# Patient Record
Sex: Female | Born: 1965 | Race: White | Hispanic: No | Marital: Married | Smoking: Never smoker
Health system: Southern US, Community
[De-identification: ages and names within clinical notes are randomized; demographics above are authoritative.]

## PROBLEM LIST (undated history)

## (undated) DIAGNOSIS — J40 Bronchitis, not specified as acute or chronic: Secondary | ICD-10-CM

## (undated) DIAGNOSIS — F329 Major depressive disorder, single episode, unspecified: Secondary | ICD-10-CM

## (undated) DIAGNOSIS — J189 Pneumonia, unspecified organism: Secondary | ICD-10-CM

## (undated) DIAGNOSIS — F419 Anxiety disorder, unspecified: Secondary | ICD-10-CM

## (undated) DIAGNOSIS — K219 Gastro-esophageal reflux disease without esophagitis: Secondary | ICD-10-CM

## (undated) DIAGNOSIS — F32A Depression, unspecified: Secondary | ICD-10-CM

## (undated) DIAGNOSIS — Z9889 Other specified postprocedural states: Secondary | ICD-10-CM

## (undated) DIAGNOSIS — G43909 Migraine, unspecified, not intractable, without status migrainosus: Secondary | ICD-10-CM

## (undated) DIAGNOSIS — G4700 Insomnia, unspecified: Secondary | ICD-10-CM

## (undated) DIAGNOSIS — T8859XA Other complications of anesthesia, initial encounter: Secondary | ICD-10-CM

## (undated) DIAGNOSIS — R112 Nausea with vomiting, unspecified: Secondary | ICD-10-CM

## (undated) DIAGNOSIS — M797 Fibromyalgia: Secondary | ICD-10-CM

## (undated) DIAGNOSIS — T4145XA Adverse effect of unspecified anesthetic, initial encounter: Secondary | ICD-10-CM

## (undated) HISTORY — PX: ABDOMINAL HYSTERECTOMY: SHX81

## (undated) HISTORY — DX: Insomnia, unspecified: G47.00

## (undated) HISTORY — DX: Migraine, unspecified, not intractable, without status migrainosus: G43.909

## (undated) HISTORY — DX: Fibromyalgia: M79.7

---
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
## 1986-12-12 HISTORY — PX: CHOLECYSTECTOMY: SHX55

## 2000-10-09 ENCOUNTER — Encounter: Admission: RE | Admit: 2000-10-09 | Discharge: 2000-10-19 | Payer: Self-pay | Admitting: Occupational Medicine

## 2001-03-28 ENCOUNTER — Other Ambulatory Visit: Admission: RE | Admit: 2001-03-28 | Discharge: 2001-03-28 | Payer: Self-pay | Admitting: Neurology

## 2001-04-15 ENCOUNTER — Emergency Department (HOSPITAL_COMMUNITY): Admission: EM | Admit: 2001-04-15 | Discharge: 2001-04-15 | Payer: Self-pay | Admitting: Emergency Medicine

## 2001-04-15 ENCOUNTER — Encounter: Payer: Self-pay | Admitting: Emergency Medicine

## 2001-07-30 ENCOUNTER — Emergency Department (HOSPITAL_COMMUNITY): Admission: EM | Admit: 2001-07-30 | Discharge: 2001-07-30 | Payer: Self-pay | Admitting: Emergency Medicine

## 2001-07-30 ENCOUNTER — Encounter: Payer: Self-pay | Admitting: Emergency Medicine

## 2001-09-14 ENCOUNTER — Ambulatory Visit (HOSPITAL_COMMUNITY): Admission: RE | Admit: 2001-09-14 | Discharge: 2001-09-14 | Payer: Self-pay | Admitting: Family Medicine

## 2001-09-14 ENCOUNTER — Encounter: Payer: Self-pay | Admitting: Family Medicine

## 2001-10-09 ENCOUNTER — Emergency Department (HOSPITAL_COMMUNITY): Admission: EM | Admit: 2001-10-09 | Discharge: 2001-10-09 | Payer: Self-pay | Admitting: Emergency Medicine

## 2001-11-05 ENCOUNTER — Encounter: Payer: Self-pay | Admitting: Family Medicine

## 2001-11-05 ENCOUNTER — Other Ambulatory Visit: Admission: RE | Admit: 2001-11-05 | Discharge: 2001-11-05 | Payer: Self-pay | Admitting: Obstetrics & Gynecology

## 2001-11-05 ENCOUNTER — Ambulatory Visit (HOSPITAL_COMMUNITY): Admission: RE | Admit: 2001-11-05 | Discharge: 2001-11-05 | Payer: Self-pay | Admitting: Family Medicine

## 2002-07-23 ENCOUNTER — Inpatient Hospital Stay (HOSPITAL_COMMUNITY): Admission: RE | Admit: 2002-07-23 | Discharge: 2002-07-24 | Payer: Self-pay | Admitting: Obstetrics & Gynecology

## 2003-03-27 ENCOUNTER — Ambulatory Visit (HOSPITAL_COMMUNITY): Admission: RE | Admit: 2003-03-27 | Discharge: 2003-03-27 | Payer: Self-pay | Admitting: Internal Medicine

## 2003-03-27 ENCOUNTER — Encounter: Payer: Self-pay | Admitting: Internal Medicine

## 2003-03-31 ENCOUNTER — Encounter: Payer: Self-pay | Admitting: Emergency Medicine

## 2003-03-31 ENCOUNTER — Emergency Department (HOSPITAL_COMMUNITY): Admission: EM | Admit: 2003-03-31 | Discharge: 2003-03-31 | Payer: Self-pay | Admitting: Emergency Medicine

## 2003-12-13 DIAGNOSIS — M797 Fibromyalgia: Secondary | ICD-10-CM

## 2003-12-13 HISTORY — DX: Fibromyalgia: M79.7

## 2004-12-20 ENCOUNTER — Emergency Department (HOSPITAL_COMMUNITY): Admission: EM | Admit: 2004-12-20 | Discharge: 2004-12-20 | Payer: Self-pay | Admitting: *Deleted

## 2004-12-29 ENCOUNTER — Ambulatory Visit (HOSPITAL_COMMUNITY): Admission: RE | Admit: 2004-12-29 | Discharge: 2004-12-29 | Payer: Self-pay | Admitting: Internal Medicine

## 2004-12-29 ENCOUNTER — Ambulatory Visit: Payer: Self-pay | Admitting: Internal Medicine

## 2005-01-20 ENCOUNTER — Ambulatory Visit: Payer: Self-pay | Admitting: Internal Medicine

## 2005-02-01 ENCOUNTER — Ambulatory Visit (HOSPITAL_COMMUNITY): Admission: RE | Admit: 2005-02-01 | Discharge: 2005-02-01 | Payer: Self-pay | Admitting: Obstetrics & Gynecology

## 2006-04-06 ENCOUNTER — Emergency Department (HOSPITAL_COMMUNITY): Admission: EM | Admit: 2006-04-06 | Discharge: 2006-04-06 | Payer: Self-pay | Admitting: Emergency Medicine

## 2006-12-12 HISTORY — PX: SHOULDER SURGERY: SHX246

## 2008-10-28 ENCOUNTER — Ambulatory Visit (HOSPITAL_COMMUNITY): Admission: RE | Admit: 2008-10-28 | Discharge: 2008-10-28 | Payer: Self-pay | Admitting: Family Medicine

## 2008-11-13 ENCOUNTER — Ambulatory Visit: Payer: Self-pay | Admitting: Specialist

## 2008-11-19 ENCOUNTER — Ambulatory Visit: Payer: Self-pay | Admitting: Specialist

## 2009-08-03 ENCOUNTER — Ambulatory Visit (HOSPITAL_COMMUNITY): Admission: RE | Admit: 2009-08-03 | Discharge: 2009-08-03 | Payer: Self-pay | Admitting: Family Medicine

## 2010-07-23 ENCOUNTER — Ambulatory Visit (HOSPITAL_COMMUNITY): Admission: RE | Admit: 2010-07-23 | Discharge: 2010-07-23 | Payer: Self-pay | Admitting: Family Medicine

## 2011-01-02 ENCOUNTER — Encounter: Payer: Self-pay | Admitting: Family Medicine

## 2011-04-29 NOTE — Discharge Summary (Signed)
NAME:  Nichole Brown, Nichole Brown NO.:  [id number]   MEDICAL RECORD NO.:  [id number]          PATIENT TYPE:  EMS   LOCATION:  MAJO                         FACILITY:  [facility]   PHYSICIAN:  Duke Salvia, M.D.  DATE OF BIRTH:  Jan 22, 1966   DATE OF ADMISSION:  12/20/2004  DATE OF DISCHARGE:                                 DISCHARGE SUMMARY   DISCHARGE DIAGNOSES:  1.  Recurrent syncope associated with tachy palpitations, chest pressure,      extreme weakness and nausea.  2.  Finding of maxillary sinusitis by head CT taken in the emergency room.  3.  Headache, now resolved.  4.  Prescribed Augmentin for a ten day course.  5.  Sinusitis.  6.  Proposed tilt study on Wednesday, January 18 as the first case.   SECONDARY DIAGNOSES:  1.  Chronic back pain/fibromyalgia.  2.  Status post hysterectomy.   PROCEDURES:  1.  CT of the head in the emergency room showing evidence of active      maxillary sinusitis.  2.  Electrocardiogram taken at 9:30 in the morning on January 9.  This shows      sinus rhythm, no ST segment abnormalities.   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  She came to work at  about 6:15 and had a bagel at about 7:30.  At about 8 o'clock she started  feeling the slow but steady onset of nausea.  She went to the employee's  lounge where she felt weak and threw up the bagel.  She sat down in the  lounge for a while, resting and gathering her strength. She stood up to  leave, walked about 20 feet out into the catheterization laboratory and then  felt as if she were going to pass out; basically a pre-syncopal episode.  A  fellow employee eased her back into the lounge.  She was transferred from  there to the emergency room.  Electrocardiogram showed sinus rhythm.  A CT  of the head showed maxillary sinusitis.  She had a headache which resolved  after ibuprofen.  She was seen  in consultation by Dr. Sherryl Manges. At the  time of his investigation, the patient was still having palpitations.  Blood  pressure was low at 71, but had risen to 113.  Pulse was slightly irregular  at that time.  Dr. Graciela Husbands noted no nystagmus on this examination.   HOSPITAL COURSE:  After transfer to the emergency room, the patient was  hydrated with 1,000 mL of normal saline.  Serial electrolytes were obtained  with a sodium of 141, a potassium of 3.7, a chloride of 115, C02 23, glucose  93, BUN 9 and creatinine 0.6.  SGOT was 21, SGPT was 26 and alkaline  phosphatase 72.  Complete blood count:  White count 9.8, hemoglobin 12.3,  hematocrit 35.0 and platelet count 265,000.  She was 99% oxygen saturated on  room air.  Vital signs:  Temperature 97.0, blood pressure 96/67, heart rate  71 and respirations  16.  The patient's medications prior to this event are  Flexeril, Paxil and hormone replacement therapy.   DISCHARGE MEDICATIONS:  In addition to her regular medications, the patient  is discharged on:  1.  Augmentin 875 mg twice daily for a ten day course.  2.  Antivert 25 mg to take every six hours as needed.  3.  The patient received Zofran here 4 mg IV for nausea.  4.  She received ibuprofen 800 mg for a headache.   FOLLOW UP:  The patient has been scheduled for a tilt table study on December 29, 2004 with the first case for Dr. Graciela Husbands.  The patient will be called with  this appointment.       GM/MEDQ  D:  12/20/2004  T:  12/20/2004  Job:  [id number]   cc:   Duke Salvia, M.D.

## 2011-04-29 NOTE — H&P (Signed)
NAME:  Nichole Brown, Nichole Brown                        ACCOUNT NO.:  [id number]   MEDICAL RECORD NO.:  [id number]                   PATIENT TYPE:  AMB   LOCATION:  DAY                                  FACILITY:  [facility]   PHYSICIAN:  Lazaro Arms, M.D.                DATE OF BIRTH:  February 26, 1966   DATE OF ADMISSION:  07/23/2002  DATE OF DISCHARGE:                                HISTORY & PHYSICAL   HISTORY OF PRESENT ILLNESS:  The patient is a 45 year old white female  gravida 2, para 2 who is admitted for a vaginal hysterectomy  bilateral  salpingo-oophorectomy.  The patient's periods are very heavy, she bleeds 5-6  days.  The first day she gets sweaty, shaky, lightheaded, and often passes  out.  Her cramps are also quite unbearable and unresponsive to nonsteroidal  anti-inflammatory medications and even low-grade narcotics.  Additionally,  she suffers with menstrual migraines the first day of her period and  sometimes they can go on for a couple of days.  Additionally, she has a  great deal of emotionality before her menses, diarrhea, and abdominal  cramping when she has her period.  She has been tried on birth control pills  in the past but they do not work and actually make her feel worse.  The  patient states that when she has clots it feels like she is pushing out a  baby.  As a result, she is admitted for the above mentioned surgery.   PAST SURGICAL HISTORY:  She had a C-section in 1988 and 1990, her  gallbladder in 1988, and she had open heart ablation in [place] in  1967.   PAST MEDICAL HISTORY:  Otherwise, negative except for herpes.   PAST OB HISTORY:  Again, is two C-sections.   CURRENT MEDICATIONS:  Currently none, just as needed for her cramps and her  migraines.  She has been seen at [facility] these.   REVIEW OF SYSTEMS:  Otherwise, negative.   PHYSICAL EXAMINATION:  VITAL SIGNS:  Blood pressure 120/80, weight 158  pounds, hematocrit 14  mg/dL.  HEENT:  Unremarkable.  NECK:  Thyroid is normal.  LUNGS:  Clear.  HEART:  Regular rate and rhythm with no murmur, regurgitation, or gallop.  BREAST:  Without mass, tissue, or skin changes.  ABDOMEN:  Benign, no hepatosplenomegaly or masses.  GENITALIA:  She has normal external genitalia, __________ discharge.  Her  cervix is nulliparous without lesion.  There is some descent.  Uterus is  normal size, shape, and contour, tender to palpation.  The adnexa are also  tender.  EXTREMITIES:  Warm with no edema.  NEUROLOGICAL:  Grossly intact.   IMPRESSION:  1. Menometrorrhagia.  2. Dysmenorrhea.  3. Dyspareunia.  4. Menstrual migraines.  5. Bad premenstrual dysphoric disorder.   PLAN:  The patient's admitted for a total abdominal hysterectomy bilateral  salpingo-oophorectomy.  She  understands that with having had two C-sections,  the bladder may be too adherent and we may have to go to abdominal approach  but with the descent she has, it should be a reasonable option to try.  She  understands this will render her menopausal and will require estrogen  replacement therapy postoperatively.                                                   Lazaro Arms, M.D.    Loraine Maple  D:  07/22/2002  T:  07/22/2002  Job:  [id number]

## 2011-04-29 NOTE — Discharge Summary (Signed)
   NAME:  Nichole Brown, Nichole Brown                        ACCOUNT NO.:  [id number]   MEDICAL RECORD NO.:  [id number]                   PATIENT TYPE:  INP   LOCATION:  A417                                 FACILITY:  [facility]   PHYSICIAN:  Lazaro Arms, M.D.                DATE OF BIRTH:  December 07, 1966   DATE OF ADMISSION:  07/23/2002  DATE OF DISCHARGE:  07/24/2002                                 DISCHARGE SUMMARY   DISCHARGE DIAGNOSES:  1. Status post total vaginal hysterectomy and bilateral salpingo-     oophorectomy.  2. Unremarkable postoperative course.   PROCEDURE:  TVH- BSO.   Please refer to the transcribed history and physical, and the operative note  for details of admission to hospital .   HOSPITAL COURSE:  The patient was admitted after surgery.  Intraoperative  procedure went well.  Her postoperative course was completely unremarkable.  She tolerated clear liquids and a regular diet.  Voided without symptoms.  Was ambulatory without symptoms.  Tolerated transition from IV to oral pain  medicine.  She was in minimal pain, having no bleeding per vagina.  Her  hemoglobin and hematocrit on postoperative day #1 was 11.0 and 31.5.  Preoperatively she was 13 and 39.  She was discharged to home on the evening  of postoperative day #1 in good, stable condition.  She will follow up in  the office on next Wednesday.  She was given Toradol 10 mg q.8h. and Tylox  as needed for pain, and she was given instructions and precautions for  return prior to that time.                                               Lazaro Arms, M.D.    Loraine Maple  D:  07/24/2002  T:  07/29/2002  Job:  [id number]

## 2011-04-29 NOTE — Op Note (Signed)
NAME:  Nichole Brown, Nichole Brown                        ACCOUNT NO.:  [id number]   MEDICAL RECORD NO.:  [id number]                   PATIENT TYPE:  AMB   LOCATION:  DAY                                  FACILITY:  [facility]   PHYSICIAN:  Lazaro Arms, M.D.                DATE OF BIRTH:  Feb 09, 1966   DATE OF PROCEDURE:  07/23/2002  DATE OF DISCHARGE:                                 OPERATIVE REPORT   PREOPERATIVE DIAGNOSES:  1. Menometrorrhagia.  2. Dysmenorrhea.  3. Dyspareunia.  4. Menstrual migraines.  5. Premenstrual dysphoric disorder.   POSTOPERATIVE DIAGNOSES:  1. Menometrorrhagia.  2. Dysmenorrhea.  3. Dyspareunia.  4. Menstrual migraines.  5. Premenstrual dysphoric disorder.   PROCEDURE:  Total vaginal hysterectomy, bilateral salpingo-oophorectomy.   SURGEON:  Lazaro Arms, M.D.   ANESTHESIA:  General endotracheal.   ESTIMATED BLOOD LOSS:  250 cc.   FINDINGS:  The patient had what appeared to be a normal uterus, tubes, and  ovaries.  Shows no abnormalities of the peritoneal cavity that were seen.   DESCRIPTION OF OPERATION:  The patient was taken to the operating room,  placed in a supine position, where she underwent general endotracheal  anesthesia.  She was then placed in dorsal lithotomy position.  The vagina  was prepped.  She was draped in the usual sterile fashion.  A Foley catheter  was placed.  A weighted speculum was placed in the posterior vagina, and the  cervix was grasped with thyroid tenaculum.  Sensorcaine 0.5% with 1:200,000  epinephrine was injected circumferentially about the cervix at the area of  the incision.  For hemostasis, electrocautery unit was used, and the  circumferential incision was made.  The anterior and posterior vagina was  pushed off the lower uterine segment without difficulty.  The posterior cul-  de-sac was entered sharply.  The left uterosacral ligament was clamped, cut,  transfixed, and suture ligated and held.  The right round  ligament was  clamped, cut, transfixed, and suture ligated and held.  A weighted speculum  was placed in the posterior peritoneum.  Cardinal ligament was clamped, cut,  transfixed, and suture ligated and cut.  A long bill weighted speculum was  then placed in the posterior peritoneum.  Serial pedicles were taken up the  cervix, through the cardinal ligament, each pedicle being clamped, cut,  transfixed, and suture ligated and cut.  The anterior vagina was pushed  further off the lower segment, and the anterior peritoneum was entered  sharply under direct visualization without difficulty.  The anterior and  posterior leaves of the broad ligament were plicated, and the uterine  vessels were clamped, cut, and suture ligated, serial pedicles bilaterally.  Serial pedicles were then taken up the fundus, each pedicle being clamped,  cut, and suture ligated.  The uteroovarian ligaments were cross-clamped  bilaterally, double suture ligated, and held.  The infundibulopelvic  ligament was then  isolated, clamped, cut, and double suture ligated  bilaterally, and the ovaries and tubes were removed bilaterally as well.  There was good hemostasis of these pedicles.  The bottom of the peritoneum  was seen and was not bleeding, had not slipped out of the suture.  The lower  pelvis was then irrigated vigorously.  The peritoneum was closed in a  pursestring fashion.  The anterior and posterior vagina was closed in an  interrupted fashion, anteriorly, posteriorly without difficulty, and the  vagina was closed.  The uterosacral ligament held sutures were then tied  together and cut for vaginal support.  The vagina was hemostatic at this  point.  The patient was awakened from anesthesia and taken to the recovery  room in good, stable condition.  All counts being correct.  She was stable  in the recovery room.  Estimated blood loss was 250 cc.                                                Lazaro Arms,  M.D.    Loraine Maple  D:  07/23/2002  T:  07/29/2002  Job:  [id number]

## 2012-06-26 ENCOUNTER — Other Ambulatory Visit: Payer: Self-pay | Admitting: Family Medicine

## 2012-06-26 ENCOUNTER — Ambulatory Visit (HOSPITAL_COMMUNITY)
Admission: RE | Admit: 2012-06-26 | Discharge: 2012-06-26 | Disposition: A | Discharge status: Home / Self Care | Payer: PRIVATE HEALTH INSURANCE | Source: Ambulatory Visit | Attending: Family Medicine | Admitting: Family Medicine

## 2012-06-26 DIAGNOSIS — R059 Cough, unspecified: Secondary | ICD-10-CM

## 2012-06-26 DIAGNOSIS — R05 Cough: Secondary | ICD-10-CM

## 2012-06-26 DIAGNOSIS — N644 Mastodynia: Secondary | ICD-10-CM | POA: Insufficient documentation

## 2012-06-26 DIAGNOSIS — R0602 Shortness of breath: Secondary | ICD-10-CM | POA: Insufficient documentation

## 2013-03-05 ENCOUNTER — Telehealth: Payer: Self-pay | Admitting: Family Medicine

## 2013-03-05 NOTE — Telephone Encounter (Signed)
Rxs called into [facility]. Patient notified.

## 2013-03-05 NOTE — Telephone Encounter (Signed)
We can call in zofran odt 4 24 one every six hours prn. I'll write a rx for hydrocod/acet 5/325 #24 prn pain.

## 2013-03-05 NOTE — Telephone Encounter (Signed)
Was given Cefprozil 02/22/2013 and patient still has ear pain and migraine headache.  Feeling nausea also.

## 2013-03-22 ENCOUNTER — Telehealth: Payer: Self-pay | Admitting: Family Medicine

## 2013-03-22 NOTE — Telephone Encounter (Signed)
Pt states that she is still having ear pain and would like to know if she can get another round of antibiotic? She finished her last one about 2 weeks ago.  Or does she need to come back in? She states her right ear hurt worse at night when she lays down to sleep. Please use [facility] and call pt [phone]

## 2013-03-22 NOTE — Telephone Encounter (Signed)
Cefzil 500 twice a day 10 days

## 2013-03-22 NOTE — Telephone Encounter (Signed)
Med called into [facility]. Patient notified. 

## 2013-03-23 ENCOUNTER — Encounter: Payer: Self-pay | Admitting: *Deleted

## 2013-04-26 ENCOUNTER — Ambulatory Visit (INDEPENDENT_AMBULATORY_CARE_PROVIDER_SITE_OTHER): Payer: 59 | Admitting: Family Medicine

## 2013-04-26 ENCOUNTER — Encounter: Payer: Self-pay | Admitting: Family Medicine

## 2013-04-26 VITALS — BP 100/60 | Temp 98.3°F | Ht 63.5 in | Wt 160.4 lb

## 2013-04-26 DIAGNOSIS — H6690 Otitis media, unspecified, unspecified ear: Secondary | ICD-10-CM

## 2013-04-26 DIAGNOSIS — H6691 Otitis media, unspecified, right ear: Secondary | ICD-10-CM

## 2013-04-26 MED ORDER — AMOXICILLIN-POT CLAVULANATE 875-125 MG PO TABS: 1.0000 | ORAL_TABLET | Freq: Two times a day (BID) | ORAL | Status: DC

## 2013-04-26 MED ORDER — ETODOLAC 400 MG PO TABS: 400.0000 mg | ORAL_TABLET | Freq: Two times a day (BID) | ORAL | Status: DC

## 2013-04-26 NOTE — Progress Notes (Signed)
  Subjective:    Patient ID: Nichole Brown, female    DOB: 1966-06-22, 47 y.o.   MRN: [id number]  Neck Pain  This is a new problem. The current episode started in the past 7 days. The problem occurs constantly. The problem has been gradually worsening. The pain is associated with nothing. The pain is present in the right side. The quality of the pain is described as shooting. The pain is at a severity of 5/10. The pain is moderate. The symptoms are aggravated by sneezing. The pain is worse during the day. Stiffness is present in the morning. She has tried acetaminophen for the symptoms. The treatment provided mild relief.   Pain worse with certain motions. Tender at times. Also some congestion. Right ear pressure intermittently.   Review of Systems  HENT: Positive for neck pain.    ROS otherwise negative.     Objective:   Physical Exam  Alert no acute distress. Right lateral neck tender to palpation. Right TM some retraction. Pharynx normal neck supple. Lungs clear heart regular in rhythm.      Assessment & Plan:  Impression 1 persistent right TM effusion. #2 lateral neck strain. Plan local measures discussed. Antibiotics prescribed. Anti-inflammatory medicine prescribed. Expect gradual resolution.

## 2013-04-26 NOTE — Patient Instructions (Signed)
Take all the antibiotics 

## 2013-05-01 ENCOUNTER — Telehealth: Payer: Self-pay | Admitting: Family Medicine

## 2013-05-01 ENCOUNTER — Encounter: Payer: Self-pay | Admitting: Family Medicine

## 2013-05-01 MED ORDER — LEVOFLOXACIN 500 MG PO TABS: 500.0000 mg | ORAL_TABLET | Freq: Every day | ORAL | Status: AC

## 2013-05-01 NOTE — Telephone Encounter (Signed)
Sent in levaquin to pharmacy. Patient was notified. Please give patient a work excuse for today. Thank you.

## 2013-05-01 NOTE — Telephone Encounter (Signed)
Pt was seen on 04/26/13 and was diagnosed with right TM effusion and lateral neck strain. She was prescribed Augmentin 875 mg BID x 10 days and Lodine 400 mg BID.

## 2013-05-01 NOTE — Telephone Encounter (Signed)
Pt states since she started taking her antibiotic (amoxicillin) and her anti inflammatory meds you issued that day as well. Since then she has new symptoms are, throat on fire, ears burning, coughing and sneezing, headache, chills and vomiting. Does she need a stronger antibiotic or come back in for re evaluation? [facility]

## 2013-05-01 NOTE — Telephone Encounter (Signed)
Change atx to levaquin 500 qd 10 days, ov thur or fri if not improving. stip other atcx

## 2013-05-27 ENCOUNTER — Telehealth: Payer: Self-pay | Admitting: Family Medicine

## 2013-05-27 NOTE — Telephone Encounter (Signed)
Last office visit 04/26/13 for ear pain

## 2013-05-27 NOTE — Telephone Encounter (Signed)
Patient was transferred to front desk to schedule appointment for tomorrow.  

## 2013-05-27 NOTE — Telephone Encounter (Signed)
Pt calling stating she has been on 3 rounds of antibiotics for her ear pain, she takes a daily allergy med and it still don't feel any better. What else can she do or try? [facility]

## 2013-05-27 NOTE — Telephone Encounter (Signed)
We've only seen once since march (May 15), needs ov tom

## 2013-05-27 NOTE — Telephone Encounter (Signed)
Left message to return call on voicemail 

## 2013-05-29 ENCOUNTER — Ambulatory Visit (INDEPENDENT_AMBULATORY_CARE_PROVIDER_SITE_OTHER): Payer: 59 | Admitting: Family Medicine

## 2013-05-29 ENCOUNTER — Encounter: Payer: Self-pay | Admitting: Family Medicine

## 2013-05-29 VITALS — BP 109/66 | Temp 98.7°F | Wt 160.0 lb

## 2013-05-29 DIAGNOSIS — H9201 Otalgia, right ear: Secondary | ICD-10-CM

## 2013-05-29 DIAGNOSIS — G4700 Insomnia, unspecified: Secondary | ICD-10-CM

## 2013-05-29 DIAGNOSIS — H9209 Otalgia, unspecified ear: Secondary | ICD-10-CM

## 2013-05-29 DIAGNOSIS — M797 Fibromyalgia: Secondary | ICD-10-CM

## 2013-05-29 DIAGNOSIS — IMO0001 Reserved for inherently not codable concepts without codable children: Secondary | ICD-10-CM

## 2013-05-29 NOTE — Progress Notes (Signed)
  Subjective:    Patient ID: Nichole Brown, female    DOB: 06-15-1966, 47 y.o.   MRN: [id number]  HPI Pt arrives to office with discomfort in multiple areas. Painful with pressure on the right side. Given both augmentin and lodine, didn't help much.  Right lateral neck. Sig pain. Accompanied by ringing.  Dizziness at times. Unable to help pain.  Bothers more at night than day.  Coughing spell this weekend.  Stress very sig. 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 She feels she does not get a lot of support. Has started looking into counseling.   Review of Systems No headache no chest pain no abdominal pain. ROS otherwise negative    Objective:   Physical Exam  Tearful but no acute distress. Neck supple. TMs good pharynx good lungs clear. Heart regular in rhythm. Tenderness in the lateral right sternocleidomastoid region near the angle of the jaw.      Assessment & Plan:  Impression 1 chronic pain. #2 dizziness with this. #3 elements of anxiety and depression. Patient has been on multiple medications in the past. She developed significant side effects with many of them. Plan to continue counseling. ENT consult. If symptoms persist patient to consider coming back for discussion of antidepressant. WSL

## 2013-05-30 DIAGNOSIS — G4700 Insomnia, unspecified: Secondary | ICD-10-CM | POA: Insufficient documentation

## 2013-05-30 DIAGNOSIS — M797 Fibromyalgia: Secondary | ICD-10-CM | POA: Insufficient documentation

## 2013-06-13 ENCOUNTER — Ambulatory Visit (INDEPENDENT_AMBULATORY_CARE_PROVIDER_SITE_OTHER): Payer: 59 | Admitting: Otolaryngology

## 2013-06-13 DIAGNOSIS — J310 Chronic rhinitis: Secondary | ICD-10-CM

## 2013-06-13 DIAGNOSIS — H903 Sensorineural hearing loss, bilateral: Secondary | ICD-10-CM

## 2013-06-13 DIAGNOSIS — J343 Hypertrophy of nasal turbinates: Secondary | ICD-10-CM

## 2013-06-13 DIAGNOSIS — H9209 Otalgia, unspecified ear: Secondary | ICD-10-CM

## 2013-06-13 DIAGNOSIS — H9319 Tinnitus, unspecified ear: Secondary | ICD-10-CM

## 2013-07-11 ENCOUNTER — Ambulatory Visit (INDEPENDENT_AMBULATORY_CARE_PROVIDER_SITE_OTHER): Payer: 59 | Admitting: Otolaryngology

## 2013-07-23 ENCOUNTER — Emergency Department: Payer: Self-pay | Admitting: Emergency Medicine

## 2013-07-24 ENCOUNTER — Ambulatory Visit (INDEPENDENT_AMBULATORY_CARE_PROVIDER_SITE_OTHER): Payer: 59 | Admitting: Family Medicine

## 2013-07-24 ENCOUNTER — Encounter: Payer: Self-pay | Admitting: Family Medicine

## 2013-07-24 VITALS — BP 104/68 | HR 76 | Ht 62.0 in | Wt 159.8 lb

## 2013-07-24 DIAGNOSIS — G43909 Migraine, unspecified, not intractable, without status migrainosus: Secondary | ICD-10-CM | POA: Insufficient documentation

## 2013-07-24 MED ORDER — SUMATRIPTAN SUCCINATE 50 MG PO TABS: ORAL_TABLET | ORAL | Status: DC

## 2013-07-24 MED ORDER — ONDANSETRON 4 MG PO TBDP: 4.0000 mg | ORAL_TABLET | Freq: Three times a day (TID) | ORAL | Status: DC | PRN

## 2013-07-24 NOTE — Patient Instructions (Signed)
When taking an imitrex for migraines, add a couple of advil to the dose

## 2013-07-24 NOTE — Progress Notes (Signed)
  Subjective:    Patient ID: Nichole Brown, female    DOB: 01-13-1966, 47 y.o.   MRN: [id number]  Migraine  This is a new problem. The current episode started yesterday. The problem has been gradually worsening. The pain is located in the frontal region. The pain does not radiate. The pain is at a severity of 7/10. The pain is severe. Associated symptoms include vomiting. Treatments tried: migr cocktail thru the er. The treatment provided moderate relief. Her past medical history is significant for migraine headaches and migraines in the family.    Feeling better tod, feels gets worse with stress. Throbbing h a   Review of Systems  Gastrointestinal: Positive for vomiting.       Objective:   Physical Exam Alert no acute distress. HEENT normal. Neuro exam normal. Vitals reviewed. Lungs clear. Heart regular rate and rhythm.       Assessment & Plan:  Impression migraine headache discussed. #2 significant anxiety with family stress or. Plan Imitrex in Zofran prescribed. Xanax when necessary for anxiousness exercise encourage work excuse given. WSL

## 2013-08-06 ENCOUNTER — Encounter: Payer: Self-pay | Admitting: Family Medicine

## 2013-08-06 ENCOUNTER — Ambulatory Visit (INDEPENDENT_AMBULATORY_CARE_PROVIDER_SITE_OTHER): Payer: 59 | Admitting: Family Medicine

## 2013-08-06 VITALS — BP 112/70 | Temp 98.2°F | Ht 62.0 in | Wt 160.0 lb

## 2013-08-06 DIAGNOSIS — J329 Chronic sinusitis, unspecified: Secondary | ICD-10-CM

## 2013-08-06 DIAGNOSIS — J310 Chronic rhinitis: Secondary | ICD-10-CM

## 2013-08-06 MED ORDER — BENZONATATE 100 MG PO CAPS: 100.0000 mg | ORAL_CAPSULE | Freq: Four times a day (QID) | ORAL | Status: DC | PRN

## 2013-08-06 MED ORDER — AMOXICILLIN-POT CLAVULANATE 875-125 MG PO TABS: 1.0000 | ORAL_TABLET | Freq: Two times a day (BID) | ORAL | Status: AC

## 2013-08-06 NOTE — Progress Notes (Signed)
  Subjective:    Patient ID: Nichole Brown, female    DOB: 02-16-1966, 47 y.o.   MRN: [id number]  Cough This is a new problem. The current episode started in the past 7 days. The problem has been rapidly worsening. The cough is productive of brown sputum and productive of purulent sputum. Associated symptoms include a fever, rhinorrhea, a sore throat and wheezing. Pertinent negatives include no chest pain or chills. Treatments tried: tylenol sinus, motrin. The treatment provided moderate relief. Her past medical history is significant for COPD.   Some frontal headache also different than usual migraine.   Review of Systems  Constitutional: Positive for fever. Negative for chills.  HENT: Positive for sore throat and rhinorrhea.   Respiratory: Positive for cough and wheezing.   Cardiovascular: Negative for chest pain.       Objective:   Physical Exam  Alert hydration good. TMs good. Frontal maxillary tenderness. Neck supple. Lungs bronchial cough no wheezes no crackles heart regular in rhythm      Assessment & Plan:  Impression sinusitis/bronchitis. Plan Augmentin twice a day 10 days. Symptomatic care discussed. Warning signs discussed. WSL

## 2013-09-20 ENCOUNTER — Telehealth: Payer: Self-pay | Admitting: Family Medicine

## 2013-09-20 ENCOUNTER — Other Ambulatory Visit: Payer: Self-pay | Admitting: Nurse Practitioner

## 2013-09-20 MED ORDER — ALPRAZOLAM 0.5 MG PO TABS: 0.5000 mg | ORAL_TABLET | Freq: Every evening | ORAL | Status: DC | PRN

## 2013-09-20 NOTE — Telephone Encounter (Signed)
Patient would like a refill on her xanax. She is completely out. She states that they help her sleep a lot better at night and are working well.  [facility].

## 2013-09-23 ENCOUNTER — Other Ambulatory Visit: Payer: Self-pay

## 2013-09-23 MED ORDER — ALPRAZOLAM 0.5 MG PO TABS: 0.5000 mg | ORAL_TABLET | Freq: Every evening | ORAL | Status: DC | PRN

## 2013-09-23 NOTE — Telephone Encounter (Signed)
Pt calling to check on this script, wants to know why it was sent to [facility] an not Dr Brett Canales? Please call pt when this has been done.

## 2013-09-23 NOTE — Telephone Encounter (Signed)
Script was signed and faxed to pharmacy on 09/20/13. Reprinted new script and Eber Jones signed it and faxed it back to [facility]. Left message on voicemail notifying patient.

## 2013-09-23 NOTE — Telephone Encounter (Signed)
May ref xanax may need to call PHarmacy to clarify dose.

## 2013-11-15 ENCOUNTER — Encounter: Payer: Self-pay | Admitting: Family Medicine

## 2013-11-15 ENCOUNTER — Ambulatory Visit (INDEPENDENT_AMBULATORY_CARE_PROVIDER_SITE_OTHER): Payer: 59 | Admitting: Family Medicine

## 2013-11-15 VITALS — BP 112/72 | Ht 63.0 in | Wt 156.6 lb

## 2013-11-15 DIAGNOSIS — Z0000 Encounter for general adult medical examination without abnormal findings: Secondary | ICD-10-CM

## 2013-11-15 NOTE — Progress Notes (Signed)
   Subjective:    Patient ID: Nichole Brown, female    DOB: 1966/01/22, 47 y.o.   MRN: [id number]  HPI Patient is here today for a wellness exam.  No concerns.  No colon ca in the family, throat c  Hyster ten yrs ago due to excess bleeding  mammo's generally yearly   Glands swollen in axillary region often comes and goes,  Exercise two to three times per wk, not counting a lot of walking at the [facility]  Eats healthy, Occasionally eats fried foods Review of Systems  Constitutional: Negative for activity change, appetite change and fatigue.  HENT: Negative for congestion, ear discharge and rhinorrhea.   Eyes: Negative for discharge.  Respiratory: Negative for cough, chest tightness and wheezing.   Cardiovascular: Negative for chest pain.  Gastrointestinal: Negative for vomiting and abdominal pain.  Genitourinary: Negative for frequency and difficulty urinating.  Musculoskeletal: Negative for neck pain.  Allergic/Immunologic: Negative for environmental allergies and food allergies.  Neurological: Negative for weakness and headaches.  Psychiatric/Behavioral: Negative for behavioral problems and agitation.       Objective:   Physical Exam  Constitutional: She is oriented to person, place, and time. She appears well-developed and well-nourished.  HENT:  Head: Normocephalic.  Right Ear: External ear normal.  Left Ear: External ear normal.  Eyes: Pupils are equal, round, and reactive to light.  Neck: Normal range of motion. No thyromegaly present.  Cardiovascular: Normal rate, regular rhythm, normal heart sounds and intact distal pulses.   No murmur heard. Pulmonary/Chest: Effort normal and breath sounds normal. No respiratory distress. She has no wheezes.  Abdominal: Soft. Bowel sounds are normal. She exhibits no distension and no mass. There is no tenderness.  Musculoskeletal: Normal range of motion. She exhibits no edema and no tenderness.  Lymphadenopathy:    She has no  cervical adenopathy.  Neurological: She is alert and oriented to person, place, and time. She exhibits normal muscle tone.  Skin: Skin is warm and dry.  Psychiatric: She has a normal mood and affect. Her behavior is normal.          Assessment & Plan:  Impression 1 preventive exam. Plan diet discussed. Exercise discussed. Patient to schedule mammogram. Flu shot already given. Information regarding colonoscopy given in encourage. Appropriate blood work. WSL

## 2013-12-14 LAB — LIPID PANEL
Cholesterol: 160 mg/dL (ref 0–200)
HDL: 54 mg/dL (ref >39)
LDL Cholesterol: 87 mg/dL (ref 0–99)
Total CHOL/HDL Ratio: 3 Ratio
Triglycerides: 97 mg/dL (ref <150)
VLDL: 19 mg/dL (ref 0–40)

## 2013-12-14 LAB — GLUCOSE, RANDOM: Glucose, Bld: 86 mg/dL (ref 70–99)

## 2013-12-17 ENCOUNTER — Telehealth: Payer: Self-pay | Admitting: Family Medicine

## 2013-12-17 NOTE — Telephone Encounter (Signed)
Patient needs to find out results to [facility]

## 2013-12-17 NOTE — Telephone Encounter (Signed)
See lab mess

## 2013-12-17 NOTE — Telephone Encounter (Signed)
Patient notified and verbalized understanding of results 

## 2013-12-17 NOTE — Progress Notes (Signed)
Patient notified and verbalized understanding of results 

## 2014-03-29 ENCOUNTER — Other Ambulatory Visit: Payer: Self-pay | Admitting: Nurse Practitioner

## 2014-03-31 NOTE — Telephone Encounter (Signed)
Ok plus 5 monthly ref 

## 2014-03-31 NOTE — Telephone Encounter (Signed)
Seen 11/15/13

## 2014-08-06 ENCOUNTER — Encounter: Payer: Self-pay | Admitting: Family Medicine

## 2014-08-06 ENCOUNTER — Ambulatory Visit (INDEPENDENT_AMBULATORY_CARE_PROVIDER_SITE_OTHER): Payer: 59 | Admitting: Family Medicine

## 2014-08-06 VITALS — BP 114/78 | Ht 63.0 in

## 2014-08-06 DIAGNOSIS — J019 Acute sinusitis, unspecified: Secondary | ICD-10-CM

## 2014-08-06 DIAGNOSIS — J3089 Other allergic rhinitis: Secondary | ICD-10-CM

## 2014-08-06 DIAGNOSIS — J069 Acute upper respiratory infection, unspecified: Secondary | ICD-10-CM

## 2014-08-06 MED ORDER — FLUTICASONE PROPIONATE 50 MCG/ACT NA SUSP: 2.0000 | Freq: Every day | NASAL | Status: DC

## 2014-08-06 MED ORDER — AZITHROMYCIN 250 MG PO TABS: ORAL_TABLET | ORAL | Status: DC

## 2014-08-06 NOTE — Progress Notes (Signed)
   Subjective:    Patient ID: Nichole Brown, female    DOB: 01-11-1966, 48 y.o.   MRN: [id number]  HPI Patient is here today with a cough, sore throat ,sneezing & fatigue. Symptoms been going on for the past couple days he has had several days of head congestion drainage sinus pressure as well as sneezing. Review of Systems    patient with coughing hoarseness denies fever chills sweats nausea vomiting Objective:   Physical Exam Lungs are clear heart is regular hoarseness noted neck no masses skin warm dry patient not toxic eardrums normal       Assessment & Plan:  Viral syndrome with possible secondary sinusitis and laryngitis no sign of bronchitis or pneumonia. Antibiotics prescribed warning signs discussed  Allergy medication also prescribed for allergic rhinitis

## 2014-08-08 ENCOUNTER — Encounter: Payer: Self-pay | Admitting: Family Medicine

## 2014-08-25 ENCOUNTER — Other Ambulatory Visit: Payer: Self-pay | Admitting: Family Medicine

## 2014-08-25 NOTE — Telephone Encounter (Signed)
Last seen 08/06/14 (sick)

## 2014-08-26 ENCOUNTER — Telehealth: Payer: Self-pay | Admitting: Family Medicine

## 2014-08-26 MED ORDER — ALPRAZOLAM 0.5 MG PO TABS: ORAL_TABLET | ORAL | Status: DC

## 2014-08-26 NOTE — Telephone Encounter (Signed)
Rx faxed to pharmacy. Patient notified. 

## 2014-08-26 NOTE — Telephone Encounter (Signed)
Patient would like Rx for ALPRAZolam (XANAX) 0.5 MG tablet for anxiety.     [place]

## 2014-08-26 NOTE — Telephone Encounter (Signed)
Ok plus 2 monthly ref 

## 2014-09-17 ENCOUNTER — Encounter: Payer: Self-pay | Admitting: Nurse Practitioner

## 2014-09-17 ENCOUNTER — Ambulatory Visit (INDEPENDENT_AMBULATORY_CARE_PROVIDER_SITE_OTHER): Payer: 59 | Admitting: Nurse Practitioner

## 2014-09-17 ENCOUNTER — Encounter: Payer: Self-pay | Admitting: Family Medicine

## 2014-09-17 VITALS — BP 102/70 | Temp 98.5°F | Ht 62.0 in | Wt 161.0 lb

## 2014-09-17 DIAGNOSIS — R5383 Other fatigue: Secondary | ICD-10-CM

## 2014-09-17 DIAGNOSIS — J329 Chronic sinusitis, unspecified: Secondary | ICD-10-CM

## 2014-09-17 DIAGNOSIS — Z139 Encounter for screening, unspecified: Secondary | ICD-10-CM

## 2014-09-17 MED ORDER — AMOXICILLIN-POT CLAVULANATE 875-125 MG PO TABS: 1.0000 | ORAL_TABLET | Freq: Two times a day (BID) | ORAL | Status: DC

## 2014-09-21 ENCOUNTER — Encounter: Payer: Self-pay | Admitting: Nurse Practitioner

## 2014-09-21 NOTE — Progress Notes (Signed)
Subjective:  Presents complaints of cough sneezing and sore throat for the past 2-3 weeks. No fever. Maxillary area sinus headache. Frequent nonproductive cough. Clear nasal drainage. Slight wheeze. Some increase in her fibromyalgia symptoms. Right ear pain. Nausea but no vomiting. No diarrhea or abdominal pain. Taking fluids well. Voiding normal limit. Has been experiencing increased fatigue lately, her mother-in-law and father both passed away this summer.  Objective:   BP 102/70  Temp(Src) 98.5 F (36.9 C) (Oral)  Ht 5\' 2"  (1.575 m)  Wt 161 lb (73.029 kg)  BMI 29.44 kg/m2 NAD. Alert, oriented. TMs cloudy effusion, no erythema. Pharynx injected with green PND noted. Neck supple with mild soft anterior adenopathy. Lungs clear. Heart regular rate rhythm.  Assessment: Rhinosinusitis  Other fatigue - Plan: CBC with Differential, Hepatic function panel, Basic metabolic panel, TSH, Vit D  25 hydroxy (rtn osteoporosis monitoring)  Screening - Plan: CBC with Differential, Lipid panel, Hepatic function panel, Basic metabolic panel, TSH, Vit D  25 hydroxy (rtn osteoporosis monitoring)  Plan:  Meds ordered this encounter  Medications  . amoxicillin-clavulanate (AUGMENTIN) 875-125 MG per tablet    Sig: Take 1 tablet by mouth 2 (two) times daily.    Dispense:  20 tablet    Refill:  0    Order Specific Question:  Supervising Provider    Answer:  Mikey Kirschner [[id number]]   OTC meds as directed for congestion. Call back if worsens or persists. Screening lab work ordered to assess for fatigue. Patient understands that her fibromyalgia and fatigue may be coming from extreme recent stress. Further followup based on results.

## 2014-09-22 ENCOUNTER — Ambulatory Visit: Payer: Self-pay | Admitting: Nurse Practitioner

## 2014-09-22 LAB — CBC WITH DIFFERENTIAL/PLATELET
Basophil #: 0.1 10*3/uL (ref 0.0–0.1)
Basophil %: 0.9 %
EOS PCT: 4.6 %
Eosinophil #: 0.4 10*3/uL (ref 0.0–0.7)
HCT: 40.7 % (ref 35.0–47.0)
HGB: 12.9 g/dL (ref 12.0–16.0)
Lymphocyte #: 3.6 10*3/uL (ref 1.0–3.6)
Lymphocyte %: 45 %
MCH: 28.4 pg (ref 26.0–34.0)
MCHC: 31.7 g/dL — AB (ref 32.0–36.0)
MCV: 90 fL (ref 80–100)
Monocyte #: 0.8 x10 3/mm (ref 0.2–0.9)
Monocyte %: 9.7 %
Neutrophil #: 3.2 10*3/uL (ref 1.4–6.5)
Neutrophil %: 39.8 %
PLATELETS: 223 10*3/uL (ref 150–440)
RBC: 4.54 10*6/uL (ref 3.80–5.20)
RDW: 13 % (ref 11.5–14.5)
WBC: 8 10*3/uL (ref 3.6–11.0)

## 2014-09-22 LAB — BASIC METABOLIC PANEL
ANION GAP: 7 (ref 7–16)
BUN: 12 mg/dL (ref 7–18)
CALCIUM: 8.4 mg/dL — AB (ref 8.5–10.1)
CHLORIDE: 108 mmol/L — AB (ref 98–107)
Co2: 28 mmol/L (ref 21–32)
Creatinine: 0.65 mg/dL (ref 0.60–1.30)
Glucose: 84 mg/dL (ref 65–99)
OSMOLALITY: 284 (ref 275–301)
Potassium: 4 mmol/L (ref 3.5–5.1)
Sodium: 143 mmol/L (ref 136–145)

## 2014-09-22 LAB — HEPATIC FUNCTION PANEL A (ARMC)
ALBUMIN: 3.5 g/dL (ref 3.4–5.0)
Alkaline Phosphatase: 83 U/L
BILIRUBIN DIRECT: 0.1 mg/dL (ref 0.00–0.20)
BILIRUBIN TOTAL: 0.2 mg/dL (ref 0.2–1.0)
SGOT(AST): 26 U/L (ref 15–37)
SGPT (ALT): 36 U/L
Total Protein: 6.7 g/dL (ref 6.4–8.2)

## 2014-09-22 LAB — LIPID PANEL
Cholesterol: 176 mg/dL (ref 0–200)
HDL Cholesterol: 46 mg/dL (ref 40–60)
LDL CHOLESTEROL, CALC: 82 mg/dL (ref 0–100)
TRIGLYCERIDES: 242 mg/dL — AB (ref 0–200)
VLDL Cholesterol, Calc: 48 mg/dL — ABNORMAL HIGH (ref 5–40)

## 2014-09-22 LAB — TSH: THYROID STIMULATING HORM: 3.59 u[IU]/mL

## 2014-09-23 ENCOUNTER — Telehealth: Payer: Self-pay | Admitting: *Deleted

## 2014-09-23 NOTE — Telephone Encounter (Signed)
Please review bloodwork pt had done at [facility].

## 2014-09-24 NOTE — Telephone Encounter (Signed)
The only lab I received was a vitamin D level which is slightly low; not enough to need Rx vitamin D. Recommend vitamin D OTC 1000 units per day.

## 2014-09-24 NOTE — Telephone Encounter (Signed)
Discussed with patient. Pt states she will start the vit D 100 units per day and she will get copy of labs to you for you to review.

## 2014-10-07 ENCOUNTER — Telehealth: Payer: Self-pay | Admitting: Family Medicine

## 2014-10-07 NOTE — Telephone Encounter (Signed)
Please reviews patients lab work in [facility]. Please send to [facility].

## 2014-10-13 NOTE — Telephone Encounter (Signed)
Overall labs are normal. Two things to mention: TG were up (type of fat in blood); watch fat and cholesterol in diet. Second, calcium level is just a little low. Recommend daily calcium and vitamin D supplement.

## 2014-10-13 NOTE — Telephone Encounter (Signed)
Notified patient overall labs are normal. Two things to mention: TG were up (type of fat in blood); watch fat and cholesterol in diet. Second, calcium level is just a little low. Recommend daily calcium and vitamin D supplement. Patient verbalized understanding.

## 2014-10-16 ENCOUNTER — Encounter: Payer: Self-pay | Admitting: Nurse Practitioner

## 2014-10-20 ENCOUNTER — Ambulatory Visit (INDEPENDENT_AMBULATORY_CARE_PROVIDER_SITE_OTHER): Payer: 59 | Admitting: Nurse Practitioner

## 2014-10-20 ENCOUNTER — Encounter: Payer: Self-pay | Admitting: Nurse Practitioner

## 2014-10-20 VITALS — BP 112/70 | HR 64 | Ht 61.0 in | Wt 161.0 lb

## 2014-10-20 DIAGNOSIS — Z1231 Encounter for screening mammogram for malignant neoplasm of breast: Secondary | ICD-10-CM

## 2014-10-20 DIAGNOSIS — Z0000 Encounter for general adult medical examination without abnormal findings: Secondary | ICD-10-CM

## 2014-10-23 ENCOUNTER — Encounter: Payer: Self-pay | Admitting: Nurse Practitioner

## 2014-10-23 NOTE — Progress Notes (Signed)
   Subjective:    Patient ID: Nichole Brown, female    DOB: 19-Apr-1966, 48 y.o.   MRN: [id number]  HPI Presents for her wellness exam. Same sexual partner. Regular vision exams. Needs a dental exam. Very active lifestyle. Healthy diet.    Review of Systems  Constitutional: Negative for fever, activity change, appetite change and fatigue.  HENT: Negative for dental problem, ear pain, sinus pressure and sore throat.   Respiratory: Negative for cough, chest tightness, shortness of breath and wheezing.   Cardiovascular: Negative for chest pain.  Gastrointestinal: Negative for nausea, vomiting, abdominal pain, diarrhea, constipation, blood in stool and abdominal distention.  Genitourinary: Negative for dysuria, urgency, frequency, vaginal discharge, enuresis, difficulty urinating, genital sores and pelvic pain.       Objective:   Physical Exam  Constitutional: She is oriented to person, place, and time. She appears well-developed. No distress.  HENT:  Right Ear: External ear normal.  Left Ear: External ear normal.  Mouth/Throat: Oropharynx is clear and moist.  Neck: Normal range of motion. Neck supple. No tracheal deviation present. No thyromegaly present.  Cardiovascular: Normal rate, regular rhythm and normal heart sounds.  Exam reveals no gallop.   No murmur heard. Pulmonary/Chest: Effort normal and breath sounds normal.  Abdominal: Soft. She exhibits no distension. There is no tenderness.  Genitourinary: Vagina normal. No vaginal discharge found.  External GU no rashes or lesions noted. Vagina slightly pale, no discharge. Bimanual exam no tenderness or masses noted. Rectal exam no masses, no stool for Hemoccult.  Musculoskeletal: She exhibits no edema.  Lymphadenopathy:    She has no cervical adenopathy.  Neurological: She is alert and oriented to person, place, and time.  Skin: Skin is warm and dry. No rash noted.  Psychiatric: She has a normal mood and affect. Her behavior is  normal.  Vitals reviewed. Breast exam: Minimal fine nodularity, no dominant masses. Axilla no adenopathy.        Assessment & Plan:  Routine general medical examination at a health care facility  Visit for screening mammogram - Plan: MM DIGITAL SCREENING BILATERAL, MM DIGITAL SCREENING BILATERAL  Given information on colonoscopy. Encouraged healthy diet regular activity in daily vitamin D/calcium supplementation. See lab report. Return in about 1 year (around 10/21/2015).

## 2014-10-24 ENCOUNTER — Encounter: Payer: Self-pay | Admitting: Nurse Practitioner

## 2014-10-30 ENCOUNTER — Ambulatory Visit (HOSPITAL_COMMUNITY)
Admission: RE | Admit: 2014-10-30 | Discharge: 2014-10-30 | Disposition: A | Discharge status: Home / Self Care | Payer: 59 | Source: Ambulatory Visit | Attending: Nurse Practitioner | Admitting: Nurse Practitioner

## 2014-10-30 DIAGNOSIS — Z1231 Encounter for screening mammogram for malignant neoplasm of breast: Secondary | ICD-10-CM | POA: Insufficient documentation

## 2014-11-04 ENCOUNTER — Ambulatory Visit (INDEPENDENT_AMBULATORY_CARE_PROVIDER_SITE_OTHER): Payer: 59 | Admitting: Family Medicine

## 2014-11-04 ENCOUNTER — Encounter: Payer: Self-pay | Admitting: Family Medicine

## 2014-11-04 VITALS — BP 128/82 | Temp 98.5°F | Ht 61.5 in | Wt 161.2 lb

## 2014-11-04 DIAGNOSIS — B029 Zoster without complications: Secondary | ICD-10-CM

## 2014-11-04 MED ORDER — VALACYCLOVIR HCL 1 G PO TABS: 1000.0000 mg | ORAL_TABLET | Freq: Three times a day (TID) | ORAL | Status: DC

## 2014-11-04 NOTE — Progress Notes (Signed)
   Subjective:    Patient ID: Nichole Brown, female    DOB: [birth year], 48 y.o.   MRN: [id number]  HPI  Patient arrives for a rash on back. Patient states she drew blood form someone with scabies and shingles. She works in the lab. Review of Systems    relates pain discomfort burning itching Objective:   Physical Exam  The rash is consistent with shingles there are multiple red areas with papules what appears to be shingles on her back it is unilateral. No sinus a light [place]      Assessment & Plan:  shingles-Valtrex 3 times a day, pain medicine patient defers warning signs discussed regarding postherpetic neuralgia  I did discuss with her the importance of minimizing exposure to patients by keeping it covered. If she is working red any patients with severe immunocompromise she is not to be around them. Otherwise she is to follow-up when necessary.

## 2014-11-05 ENCOUNTER — Telehealth: Payer: Self-pay | Admitting: Family Medicine

## 2014-11-05 MED ORDER — HYDROCODONE-ACETAMINOPHEN 5-325 MG PO TABS: 1.0000 | ORAL_TABLET | ORAL | Status: DC | PRN

## 2014-11-05 NOTE — Telephone Encounter (Signed)
Patient notified

## 2014-11-05 NOTE — Telephone Encounter (Signed)
Last seen yesterday for shingles

## 2014-11-05 NOTE — Telephone Encounter (Signed)
Pt calling to say that she is in a great deal of pain, rash under her breast now, shoulder hurts Did go to work, is she supposed to be there with this?   She thinks she may need some pain meds at this point.   [facility]   [phone]

## 2014-11-05 NOTE — Telephone Encounter (Signed)
w e this wk.  hydrocod 5/325 Numb 36 one po q 4 to 6 prn pain

## 2014-11-05 NOTE — Telephone Encounter (Signed)
Left message to return call 

## 2014-11-10 ENCOUNTER — Telehealth: Payer: Self-pay | Admitting: Family Medicine

## 2014-11-10 NOTE — Telephone Encounter (Signed)
Please call [phone] if before 3 pm.  If after, call [phone].

## 2014-11-10 NOTE — Telephone Encounter (Signed)
LMRC

## 2014-11-10 NOTE — Telephone Encounter (Signed)
Patient called today to ask how long should her shingles stay around?  And she also had other questions for the nurse that she just wanted to speak with nurse about.

## 2014-11-10 NOTE — Telephone Encounter (Signed)
Patient wanted to schedule appointment to come in and follow up on her shingles.

## 2014-11-11 ENCOUNTER — Ambulatory Visit (INDEPENDENT_AMBULATORY_CARE_PROVIDER_SITE_OTHER): Payer: 59 | Admitting: Family Medicine

## 2014-11-11 ENCOUNTER — Encounter: Payer: Self-pay | Admitting: Family Medicine

## 2014-11-11 VITALS — BP 136/82 | Ht 61.5 in | Wt 164.0 lb

## 2014-11-11 DIAGNOSIS — B029 Zoster without complications: Secondary | ICD-10-CM

## 2014-11-11 NOTE — Progress Notes (Signed)
   Subjective:    Patient ID: Nichole Brown, female    DOB: 23-May-1966, 48 y.o.   MRN: [id number]  HPI Patient was seen here for shingles on 11/24.  She has questions about it.  She wants to know if she is contagious and when it will go away.   Sig pain ongoing  Rash has been aggravating  Severe pain still  Review of Systems No headache no fever no chills no cough no abdominal pain ROS otherwise negative    Objective:   Physical Exam Alert vitals stable. Lungs clear. Heart regular in rhythm. Resolving rash noted.       Assessment & Plan:  Impression shingles discussed at length. At least 10 questions answered. Plan no further Valtrex rationale discussed. Symptomatic care discussed. Post shingles neuropathy discussed which by the way the patient does not have yet. WSL

## 2014-12-25 ENCOUNTER — Telehealth: Payer: Self-pay | Admitting: Family Medicine

## 2014-12-25 MED ORDER — ALPRAZOLAM 0.5 MG PO TABS: ORAL_TABLET | ORAL | Status: DC

## 2014-12-25 NOTE — Telephone Encounter (Signed)
Ok plus 3 ref, seen nov

## 2014-12-25 NOTE — Telephone Encounter (Signed)
Rx faxed to pharmacy. Patient notified. 

## 2014-12-25 NOTE — Telephone Encounter (Signed)
Pt requesting refill on her ALPRAZolam (XANAX) 0.5 MG tablet, please advise and please call pt when done

## 2015-01-14 ENCOUNTER — Encounter: Payer: Self-pay | Admitting: Nurse Practitioner

## 2015-01-14 ENCOUNTER — Ambulatory Visit (INDEPENDENT_AMBULATORY_CARE_PROVIDER_SITE_OTHER): Payer: 59 | Admitting: Nurse Practitioner

## 2015-01-14 ENCOUNTER — Emergency Department: Payer: Self-pay | Admitting: Emergency Medicine

## 2015-01-14 ENCOUNTER — Encounter: Payer: Self-pay | Admitting: Family Medicine

## 2015-01-14 VITALS — BP 112/72 | Temp 98.6°F | Ht 62.0 in | Wt 163.0 lb

## 2015-01-14 DIAGNOSIS — G43011 Migraine without aura, intractable, with status migrainosus: Secondary | ICD-10-CM

## 2015-01-14 MED ORDER — PROMETHAZINE HCL 25 MG PO TABS: 25.0000 mg | ORAL_TABLET | ORAL | Status: DC | PRN

## 2015-01-14 MED ORDER — TOPIRAMATE 25 MG PO TABS: ORAL_TABLET | ORAL | Status: DC

## 2015-01-15 ENCOUNTER — Encounter: Payer: Self-pay | Admitting: Nurse Practitioner

## 2015-01-15 DIAGNOSIS — Z029 Encounter for administrative examinations, unspecified: Secondary | ICD-10-CM

## 2015-01-15 NOTE — Progress Notes (Signed)
Subjective:  Presents for c/o migraine that began yesterday afternoon. Went to work this AM, almost passed out. Was taken to [facility] ED. EKG normal and given IV meds for headache. Given a Rx but patient unsure of name. No change in migraine symptomatology but waited too long to take Imitrex which did not work. Having 2-3 migraines per month on average, usually better with Imitrex. Has not identified any specific triggers. Describes this headache as pounding; nausea, no vomiting; photosensitivity. No cough or runny nose.   Objective:   BP 112/72 mmHg  Temp(Src) 98.6 F (37 C) (Oral)  Ht 5\' 2"  (1.575 m)  Wt 163 lb (73.936 kg)  BMI 29.81 kg/m2 NAD. Alert, oriented. Seen in a darkened room. Lungs clear. Heart RRR.   Assessment:  Problem List Items Addressed This Visit      Cardiovascular and Mediastinum   Migraine headache - Primary   Relevant Medications   topiramate (TOPAMAX) tablet     Plan:  Meds ordered this encounter  Medications  . topiramate (TOPAMAX) 25 MG tablet    Sig: One po qhs x 7 d then two at qhs    Dispense:  60 tablet    Refill:  0    Order Specific Question:  Supervising Provider    Answer:  Mikey Kirschner [[id number]]  . promethazine (PHENERGAN) 25 MG tablet    Sig: Take 1 tablet (25 mg total) by mouth every 4 (four) hours as needed for nausea or vomiting.    Dispense:  30 tablet    Refill:  0    Order Specific Question:  Supervising Provider    Answer:  Mikey Kirschner [[id number]]  . Patient agrees to start medicine to prevent number of migraines. Start Topamax as directed. Recommend rest in a dark room; NSAIDs as directed. Call back in AM if no improvement. Return in about 3 weeks (around 02/04/2015). Call back sooner if any problems. Keep headache diary and bring to visit.

## 2015-01-22 ENCOUNTER — Telehealth: Payer: Self-pay | Admitting: Family Medicine

## 2015-01-22 NOTE — Telephone Encounter (Signed)
Hoyle Sauer put pt on topiramate (TOPAMAX) 25 MG tablet on 01/14/2015, taking at night, not feeling too good since starting medicine, having a hard time concentrating and blurred vision with a mild headache, not sleeping well.  Tonight she's supposed to start taking 2 at bedtime.  Unsure if she should continue, please advise   [place], please call pt with suggestions  Hm# [id number], Cell# [phone]

## 2015-01-23 NOTE — Telephone Encounter (Signed)
Told patient #1: taking 2 at a time is fine; this is very low dose topiramate even at 50 mg #2: stopping med depends on side effects; if these are intolerable then yes she should stop and try something else. Pt notified and verbalized understanding. She is going to continue taking the meds over the weekend and see if it helps. Told her to call back if s/s get worst.

## 2015-01-23 NOTE — Telephone Encounter (Signed)
#  1: taking 2 at a time is fine; this is very low dose topiramate even at 50 mg #2: stopping med depends on side effects; if these are intolerable then yes she should stop and try something else

## 2015-01-23 NOTE — Telephone Encounter (Signed)
Pt is also concerned with the fact that your were telling her to take two at night  Before bed, the packaging info states to not take two at a time. Please advise

## 2015-02-03 ENCOUNTER — Encounter: Payer: Self-pay | Admitting: Family Medicine

## 2015-02-03 ENCOUNTER — Ambulatory Visit (INDEPENDENT_AMBULATORY_CARE_PROVIDER_SITE_OTHER): Payer: 59 | Admitting: Family Medicine

## 2015-02-03 VITALS — BP 122/82 | Ht 62.0 in | Wt 160.2 lb

## 2015-02-03 DIAGNOSIS — J310 Chronic rhinitis: Secondary | ICD-10-CM

## 2015-02-03 DIAGNOSIS — J329 Chronic sinusitis, unspecified: Secondary | ICD-10-CM

## 2015-02-03 DIAGNOSIS — G4700 Insomnia, unspecified: Secondary | ICD-10-CM

## 2015-02-03 DIAGNOSIS — G43001 Migraine without aura, not intractable, with status migrainosus: Secondary | ICD-10-CM

## 2015-02-03 MED ORDER — TOPIRAMATE 25 MG PO TABS: ORAL_TABLET | ORAL | Status: DC

## 2015-02-03 MED ORDER — AZITHROMYCIN 250 MG PO TABS: ORAL_TABLET | ORAL | Status: DC

## 2015-02-03 NOTE — Progress Notes (Signed)
   Subjective:    Patient ID: Nichole Brown, female    DOB: [birth year], 49 y.o.   MRN: [id number] Patient arrives office with several concerns. HPI Patient arrives for a follow up on migraines. Patient doing pretty good on new med.  Fogginess at first with the migraines  Frequency of the migr has diminished  alos not as bad nosw, dim energy  HA only two since seeing  Trouble sleeping at night time.xanax helped a bit in the past. Patient has major difficulty getting to sleep. Reports a lot of stress at home. Wonders if she continues Xanax still for this.  Now cough and cold and ong, some achey,  Stopped up, and dim energy. Cough productive of yellowish phlegm  Review of Systems    no fever no chills no vomiting no rash Objective:   Physical Exam Alert vital stable HEENT moderate nasal congestion frontal tenderness pharynx erythematous neck supple neuro exam intact lungs clear. Heart regular in rhythm.       Assessment & Plan:  Impression 1 migraine headaches clinically improved discussed #2 rhinosinusitis discussed #3 insomnia discussed plan exercise encourage. Maintain same medications. Add Z-Pak. Symptomatic care. Maintain Topamax we 6 months. WSL

## 2015-04-02 ENCOUNTER — Ambulatory Visit (INDEPENDENT_AMBULATORY_CARE_PROVIDER_SITE_OTHER): Payer: 59 | Admitting: Family Medicine

## 2015-04-02 ENCOUNTER — Encounter: Payer: Self-pay | Admitting: Family Medicine

## 2015-04-02 VITALS — BP 112/74 | Temp 98.6°F | Ht 61.5 in | Wt 158.0 lb

## 2015-04-02 DIAGNOSIS — J029 Acute pharyngitis, unspecified: Secondary | ICD-10-CM | POA: Diagnosis not present

## 2015-04-02 DIAGNOSIS — G43001 Migraine without aura, not intractable, with status migrainosus: Secondary | ICD-10-CM

## 2015-04-02 DIAGNOSIS — J019 Acute sinusitis, unspecified: Secondary | ICD-10-CM

## 2015-04-02 LAB — POCT RAPID STREP A (OFFICE): Rapid Strep A Screen: NEGATIVE

## 2015-04-02 MED ORDER — MOMETASONE FUROATE 0.1 % EX CREA: TOPICAL_CREAM | CUTANEOUS | Status: DC

## 2015-04-02 MED ORDER — SUMATRIPTAN SUCCINATE 50 MG PO TABS: ORAL_TABLET | ORAL | Status: DC

## 2015-04-02 MED ORDER — AZITHROMYCIN 250 MG PO TABS: ORAL_TABLET | ORAL | Status: DC

## 2015-04-02 NOTE — Progress Notes (Signed)
   Subjective:    Patient ID: Nichole Brown, female    DOB: 14-Jul-1966, 49 y.o.   MRN: [id number]  Sore Throat  This is a new problem. Episode onset: 1.5 weeks. Associated symptoms include coughing and headaches. Pertinent negatives include no abdominal pain or congestion. Associated symptoms comments: fever. She has had exposure to strep. Treatments tried: currently taking doxy, tylenol.   Pt stopped taking topamax because it was drying out eyes and causing vision problems. Taking tylenol for the migraines.    Review of Systems  Constitutional: Negative for activity change, appetite change and fatigue.  HENT: Negative for congestion.   Respiratory: Positive for cough.   Cardiovascular: Negative for chest pain.  Gastrointestinal: Negative for abdominal pain.  Endocrine: Negative for polydipsia and polyphagia.  Neurological: Positive for headaches. Negative for weakness.  Psychiatric/Behavioral: Negative for confusion.       Objective:   Physical Exam  Constitutional: She appears well-nourished. No distress.  Cardiovascular: Normal rate, regular rhythm and normal heart sounds.   No murmur heard. Pulmonary/Chest: Effort normal and breath sounds normal. No respiratory distress.  Musculoskeletal: She exhibits no edema.  Lymphadenopathy:    She has no cervical adenopathy.  Neurological: She is alert. She exhibits normal muscle tone.  Psychiatric: Her behavior is normal.  Vitals reviewed.   Patient not toxic      Assessment & Plan:  If persistent migraines then will need preventative medicine but currently can stay off Topamax is only getting 2 migraines per month  Viral illness Secondary rhinosinusitis Antibiotics prescribed Warning signs were discussed  Refills of abortive medicine for migraines given

## 2015-07-07 ENCOUNTER — Encounter: Payer: Self-pay | Admitting: Nurse Practitioner

## 2015-07-07 ENCOUNTER — Ambulatory Visit (INDEPENDENT_AMBULATORY_CARE_PROVIDER_SITE_OTHER): Payer: 59 | Admitting: Nurse Practitioner

## 2015-07-07 ENCOUNTER — Encounter: Payer: Self-pay | Admitting: Family Medicine

## 2015-07-07 VITALS — BP 116/68 | Ht 62.0 in | Wt 161.0 lb

## 2015-07-07 DIAGNOSIS — G43001 Migraine without aura, not intractable, with status migrainosus: Secondary | ICD-10-CM | POA: Diagnosis not present

## 2015-07-07 DIAGNOSIS — M6248 Contracture of muscle, other site: Secondary | ICD-10-CM

## 2015-07-07 DIAGNOSIS — Z0289 Encounter for other administrative examinations: Secondary | ICD-10-CM

## 2015-07-07 DIAGNOSIS — M62838 Other muscle spasm: Secondary | ICD-10-CM

## 2015-07-07 MED ORDER — MELOXICAM 15 MG PO TABS: 15.0000 mg | ORAL_TABLET | Freq: Every day | ORAL | Status: DC

## 2015-07-07 MED ORDER — NORTRIPTYLINE HCL 10 MG PO CAPS: ORAL_CAPSULE | ORAL | Status: DC

## 2015-07-07 NOTE — Progress Notes (Signed)
Subjective:   Presents for complaints of a migraine exacerbation. Having them about twice a week. Began after stopping her Topamax. Was having dry eyes and visual changes, advised by her eye doctor to stop medication. No changes in symptomatology. Has identified certain triggers such as stress , food preservatives and weather.  Objective:   BP 116/68 mmHg  Ht 5\' 2"  (1.575 m)  Wt 161 lb (73.029 kg)  BMI 29.44 kg/m2  NAD. Alert, oriented. Lungs clear. Heart regular rate rhythm. Extremely tight tender muscles noted along the right upper back and neck area into the cervical area.  Assessment:  Problem List Items Addressed This Visit      Cardiovascular and Mediastinum   Migraine headache - Primary   Relevant Medications   meloxicam (MOBIC) 15 MG tablet   nortriptyline (PAMELOR) 10 MG capsule    Other Visit Diagnoses    Muscle spasms of head and/or neck          Plan:  Meds ordered this encounter  Medications  . meloxicam (MOBIC) 15 MG tablet    Sig: Take 1 tablet (15 mg total) by mouth daily. Prn pain    Dispense:  30 tablet    Refill:  0    Order Specific Question:  Supervising Provider    Answer:  Mikey Kirschner [[id number]]  . nortriptyline (PAMELOR) 10 MG capsule    Sig: One po qhs x 6 d then 2 po qhs    Dispense:  60 capsule    Refill:  0    Order Specific Question:  Supervising Provider    Answer:  Mikey Kirschner [[id number]]    discussed options. Start low-dose nortriptyline will slowly titrate dose. Continue Imitrex as directed. Ice/heat applications Massage therapy Icy Hot Smart OTC TENS unit  call back if no improvement in headaches over the next  2-3 weeks. Return in about 3 months (around 10/07/2015) for recheck.

## 2015-07-07 NOTE — Patient Instructions (Signed)
Ice/heat applications Massage therapy Icy Hot Smart OTC TENS unit

## 2015-07-27 ENCOUNTER — Telehealth: Payer: Self-pay | Admitting: Family Medicine

## 2015-07-27 NOTE — Telephone Encounter (Signed)
Notified patient theoretically possible, cut down to one tab per day and see if tolerates. Patient verbalized understanding.

## 2015-07-27 NOTE — Telephone Encounter (Signed)
The medication the patient is talking about is nortriptyline 10 mg. She takes 2 qhs.

## 2015-07-27 NOTE — Telephone Encounter (Signed)
Pt was recently put on a new migraine medicine by Hoyle Sauer and since then the migraines have gotten better however the patient is more anxious and is wanting to know if that is a side effect. Pt states she is having heart palpations,dizziness,and her breathing gets bad when she has these. Pt states she was having these before the meds but now they are getting worse.

## 2015-07-27 NOTE — Telephone Encounter (Signed)
theoreticqlly possible, cut down to one tqb per d and see if tolerates

## 2015-08-03 ENCOUNTER — Encounter: Payer: Self-pay | Admitting: Family Medicine

## 2015-08-03 ENCOUNTER — Encounter: Payer: Self-pay | Admitting: Nurse Practitioner

## 2015-08-03 ENCOUNTER — Ambulatory Visit (INDEPENDENT_AMBULATORY_CARE_PROVIDER_SITE_OTHER): Payer: 59 | Admitting: Nurse Practitioner

## 2015-08-03 VITALS — BP 108/76 | Temp 98.6°F | Ht 62.0 in | Wt 161.0 lb

## 2015-08-03 DIAGNOSIS — G43009 Migraine without aura, not intractable, without status migrainosus: Secondary | ICD-10-CM | POA: Diagnosis not present

## 2015-08-03 DIAGNOSIS — R55 Syncope and collapse: Secondary | ICD-10-CM | POA: Diagnosis not present

## 2015-08-03 DIAGNOSIS — R5383 Other fatigue: Secondary | ICD-10-CM | POA: Diagnosis not present

## 2015-08-03 DIAGNOSIS — R002 Palpitations: Secondary | ICD-10-CM

## 2015-08-04 ENCOUNTER — Other Ambulatory Visit
Admission: RE | Admit: 2015-08-04 | Discharge: 2015-08-04 | Disposition: A | Discharge status: Home / Self Care | Payer: 59 | Source: Ambulatory Visit | Attending: Nurse Practitioner | Admitting: Nurse Practitioner

## 2015-08-04 ENCOUNTER — Encounter: Payer: Self-pay | Admitting: Nurse Practitioner

## 2015-08-04 DIAGNOSIS — G43009 Migraine without aura, not intractable, without status migrainosus: Secondary | ICD-10-CM | POA: Diagnosis not present

## 2015-08-04 LAB — CBC WITH DIFFERENTIAL/PLATELET
BASOS ABS: 0.1 10*3/uL (ref 0–0.1)
BASOS PCT: 1 %
EOS ABS: 0.3 10*3/uL (ref 0–0.7)
Eosinophils Relative: 3 %
HCT: 39.7 % (ref 35.0–47.0)
HEMOGLOBIN: 13 g/dL (ref 12.0–16.0)
Lymphocytes Relative: 42 %
Lymphs Abs: 3.9 10*3/uL — ABNORMAL HIGH (ref 1.0–3.6)
MCH: 28.3 pg (ref 26.0–34.0)
MCHC: 32.7 g/dL (ref 32.0–36.0)
MCV: 86.7 fL (ref 80.0–100.0)
MONOS PCT: 7 %
Monocytes Absolute: 0.6 10*3/uL (ref 0.2–0.9)
NEUTROS PCT: 47 %
Neutro Abs: 4.4 10*3/uL (ref 1.4–6.5)
Platelets: 231 10*3/uL (ref 150–440)
RBC: 4.58 MIL/uL (ref 3.80–5.20)
RDW: 12.7 % (ref 11.5–14.5)
WBC: 9.3 10*3/uL (ref 3.6–11.0)

## 2015-08-04 LAB — BASIC METABOLIC PANEL
Anion gap: 5 (ref 5–15)
BUN: 15 mg/dL (ref 6–20)
CO2: 27 mmol/L (ref 22–32)
Calcium: 9.1 mg/dL (ref 8.9–10.3)
Chloride: 107 mmol/L (ref 101–111)
Creatinine, Ser: 0.72 mg/dL (ref 0.44–1.00)
Glucose, Bld: 113 mg/dL — ABNORMAL HIGH (ref 65–99)
POTASSIUM: 3.6 mmol/L (ref 3.5–5.1)
SODIUM: 139 mmol/L (ref 135–145)

## 2015-08-04 LAB — TSH: TSH: 2.099 u[IU]/mL (ref 0.350–4.500)

## 2015-08-04 NOTE — Progress Notes (Signed)
Subjective:  Presents for recheck of her headaches. Decreased her nortriptyline to 10 mg. Saw minimal improvement in number of headaches. Had an episode of syncope 3 days ago. Was on her weight to work. Started feeling lightheaded. Had a protein bar and coffee about 45 minutes before. States she went in to puncture time card and lost consciousness. Has not had any further syncopal episodes. No numbness or weakness of the face arms or legs. No difficulty speaking or swallowing. No true shortness of breath, occasionally feel like she needs to "catch her breath". Feels that she has palpitations or racing heart "all of the time. No change in migraine symptomatology, seems to occur faster and more intense lately. Has a history of being prone to syncopal episodes in the past. No nausea vomiting. No visual changes. No recent illness. Generalized fatigue.  Objective:   BP 108/76 mmHg  Temp(Src) 98.6 F (37 C) (Oral)  Ht 5\' 2"  (1.575 m)  Wt 161 lb (73.029 kg)  BMI 29.44 kg/m2 NAD. Alert, oriented. Lungs clear. Heart regular rate rhythm. No murmur or gallop noted. EKG normal. Heart rate 79. Note patient states she feels like her heart is racing. Lower extremities no edema. EOMs intact without nystagmus. Point-to-point localization normal limit. Reflexes normal limit. Romberg negative. Minimal change in orthostatic blood pressure.  Assessment:  Problem List Items Addressed This Visit      Cardiovascular and Mediastinum   Migraine headache   Relevant Medications   meloxicam (MOBIC) 15 MG tablet   Other Relevant Orders   MR Brain Wo Contrast    Other Visit Diagnoses    Vasovagal syncope    -  Primary    Relevant Orders    MR Brain Wo Contrast    Palpitations        Relevant Orders    PR ELECTROCARDIOGRAM, COMPLETE    Other fatigue          Plan: Discussed the role of stress and anxiety in her symptoms. Because of recent syncopal episode as well as changes in migraine intensity and frequency, MRI  schedule. Lab work also pending. Warning signs reviewed. Patient to call or go GERD in the meantime if symptoms worsen.

## 2015-08-06 ENCOUNTER — Telehealth: Payer: Self-pay | Admitting: Nurse Practitioner

## 2015-08-06 ENCOUNTER — Ambulatory Visit: Admission: RE | Admit: 2015-08-06 | Payer: 59 | Source: Ambulatory Visit

## 2015-08-06 ENCOUNTER — Ambulatory Visit
Admission: RE | Admit: 2015-08-06 | Discharge: 2015-08-06 | Disposition: A | Discharge status: Home / Self Care | Payer: 59 | Source: Ambulatory Visit | Attending: Nurse Practitioner | Admitting: Nurse Practitioner

## 2015-08-06 ENCOUNTER — Other Ambulatory Visit: Payer: Self-pay | Admitting: Nurse Practitioner

## 2015-08-06 DIAGNOSIS — R519 Headache, unspecified: Secondary | ICD-10-CM

## 2015-08-06 DIAGNOSIS — R51 Headache: Secondary | ICD-10-CM | POA: Insufficient documentation

## 2015-08-06 NOTE — Telephone Encounter (Signed)
Patient called in with c/o chronic headache with s/s of blurred vision and nausea. Patient stated that she was told to stop taking migraine medication until MRI was completed  By Linzie Collin. Patient stated that she had MRI today and wanted to know if she could resume medication. I discussed with Pearson Forster, NP in real time and was informed to tell patient it is ok to resume Pamelor medication. Patient verbalized understanding.

## 2015-08-10 ENCOUNTER — Telehealth: Payer: Self-pay | Admitting: Nurse Practitioner

## 2015-08-10 NOTE — Telephone Encounter (Signed)
Pt states Nichole Brown her to see what med she wanted to go with,  She would like to try what ever it is you think she needs just she does not  Want to use any xanax please   Try to call pt again if you have any further questions

## 2015-08-10 NOTE — Telephone Encounter (Signed)
LMTRC

## 2015-08-12 ENCOUNTER — Other Ambulatory Visit: Payer: Self-pay | Admitting: Nurse Practitioner

## 2015-08-12 ENCOUNTER — Telehealth: Payer: Self-pay | Admitting: Nurse Practitioner

## 2015-08-12 MED ORDER — DULOXETINE HCL 20 MG PO CPEP: 20.0000 mg | ORAL_CAPSULE | Freq: Every day | ORAL | Status: DC

## 2015-08-12 NOTE — Telephone Encounter (Signed)
I spoke with patient 2 days ago. Reviewed her chart this am; she has tried Paxil, Wellbutrin, Celexa and Effexor with either minimal improvement or side effects. I recommend we try Cymbalta to see if this will help anxiety, fibromyalgia and headaches. Let me know if she agrees.

## 2015-08-12 NOTE — Telephone Encounter (Signed)
Patient advised to Stop Nortriptyline. Nichole Brown will start with lowest dose of Cymbalta and if tolerated slowly titrate up to a maximum dose of 60. Call back if any problems. Patient verbalized understanding.

## 2015-08-12 NOTE — Telephone Encounter (Signed)
Spoke with patient and patient stated that she would like to try Cymbalta.

## 2015-08-12 NOTE — Telephone Encounter (Signed)
Stop Nortriptyline. Will start with lowest dose of Cymbalta and if tolerated slowly titrate up to a maximum dose of 60. Call back if any problems.

## 2015-08-12 NOTE — Telephone Encounter (Signed)
[facility] 08/12/15

## 2015-08-12 NOTE — Telephone Encounter (Signed)
See note 08/12/15

## 2015-08-13 ENCOUNTER — Other Ambulatory Visit (HOSPITAL_COMMUNITY): Payer: 59

## 2015-08-31 ENCOUNTER — Telehealth: Payer: Self-pay | Admitting: Nurse Practitioner

## 2015-08-31 NOTE — Telephone Encounter (Signed)
Spoke with patient and informed patient per Pearson Forster, NP-That depends. If current dose is working well, stay with that for now. Sometimes dose needs to be changed after a few weeks. If so, let me know and we can go up. Plenty of room to go up on dose. Patient verbalized understanding.

## 2015-08-31 NOTE — Telephone Encounter (Signed)
That depends. If current dose is working well, stay with that for now. Sometimes dose needs to be changed after a few weeks. If so, let me know and we can go up. Plenty of room to go up on dose.

## 2015-08-31 NOTE — Telephone Encounter (Signed)
Pt calling to let you know that the meds you put her on for Migrianes is working great Only question she has is does she need to go up in the dosage or stay where she is at  DULoxetine (CYMBALTA) 20 MG capsule

## 2015-09-02 ENCOUNTER — Encounter: Payer: Self-pay | Admitting: Nurse Practitioner

## 2015-09-04 ENCOUNTER — Telehealth: Payer: Self-pay | Admitting: *Deleted

## 2015-09-04 MED ORDER — VALACYCLOVIR HCL 1 G PO TABS: ORAL_TABLET | ORAL | Status: DC

## 2015-09-04 NOTE — Telephone Encounter (Signed)
[facility] (Prescription already sent into pharmacy)

## 2015-09-04 NOTE — Telephone Encounter (Signed)
Spoke with patient and informed her per Dr.Scott Luking-Valtrex 1 g, 1 3 times a day for 7 days, follow-up next week if rash getting worse or other changes. Informed her that prescription was sent to [facility]. Patient verbalized understanding.

## 2015-09-04 NOTE — Telephone Encounter (Signed)
Pt seen Nov 04, 2014 for shingles. Pt states a rash that looks the same came up yesterday on her thigh and comes in a straight line to her buttock. She started having leg pain on Monday. Offered pt appt today. Pt declined appt. States she cannot come in today. Asked for an appt with carolyn on Monday or if something can be called in since she had shingles in the past. [facility].

## 2015-09-04 NOTE — Telephone Encounter (Signed)
Valtrex 1 g, 1 3 times a day for 7 days, follow-up next week if rash getting worse or other changes

## 2015-09-24 ENCOUNTER — Encounter: Payer: Self-pay | Admitting: Family Medicine

## 2015-09-24 ENCOUNTER — Ambulatory Visit (INDEPENDENT_AMBULATORY_CARE_PROVIDER_SITE_OTHER): Payer: 59 | Admitting: Family Medicine

## 2015-09-24 VITALS — BP 108/70 | Temp 99.4°F | Ht 62.0 in | Wt 160.5 lb

## 2015-09-24 DIAGNOSIS — J329 Chronic sinusitis, unspecified: Secondary | ICD-10-CM

## 2015-09-24 DIAGNOSIS — J310 Chronic rhinitis: Secondary | ICD-10-CM

## 2015-09-24 MED ORDER — CEFDINIR 300 MG PO CAPS: ORAL_CAPSULE | ORAL | Status: DC

## 2015-09-24 MED ORDER — HYDROCODONE-HOMATROPINE 5-1.5 MG/5ML PO SYRP: ORAL_SOLUTION | ORAL | Status: DC

## 2015-09-24 NOTE — Progress Notes (Signed)
   Subjective:    Patient ID: Nichole Brown, female    DOB: Feb 22, 1966, 49 y.o.   MRN: [id number]  Sore Throat  This is a new problem. The current episode started in the past 7 days. The problem has been gradually worsening. Neither side of throat is experiencing more pain than the other. The maximum temperature recorded prior to her arrival was 100.4 - 100.9 F. Associated symptoms include congestion, coughing and headaches. Associated symptoms comments: Wheezing, Muscle aches, Runny nose. Treatments tried: OTC cold medicine.   Patient states no other concerns this visit.  Started about a wk ago, sig sneezing and coughing   Too k alka selt pluz etc,  Felt relly bad  Felt very acheywork  Interacting all the time with pts  Feels tight in sinuses and in the chest dim enrgy Review of Systems  HENT: Positive for congestion.   Respiratory: Positive for cough.   Neurological: Positive for headaches.       Objective:   Physical Exam  Alert moderate malaise. Vitals stable frontal maxillary tenderness. Pharynx erythematous neck supple lungs intermittent cough no wheezes or crackles heart regular in rhythm.      Assessment & Plan:  Impression post viral rhinosinusitis/early bronchitis plan antibiotics prescribed. Symptom care discussed warning signs discussed WSL

## 2015-10-03 ENCOUNTER — Emergency Department (HOSPITAL_COMMUNITY): Payer: No Typology Code available for payment source

## 2015-10-03 ENCOUNTER — Encounter (HOSPITAL_COMMUNITY): Payer: Self-pay | Admitting: *Deleted

## 2015-10-03 ENCOUNTER — Emergency Department (HOSPITAL_COMMUNITY)
Admission: EM | Admit: 2015-10-03 | Discharge: 2015-10-03 | Disposition: A | Discharge status: Home / Self Care | Payer: No Typology Code available for payment source | Attending: Emergency Medicine | Admitting: Emergency Medicine

## 2015-10-03 DIAGNOSIS — Y998 Other external cause status: Secondary | ICD-10-CM | POA: Insufficient documentation

## 2015-10-03 DIAGNOSIS — S20412A Abrasion of left back wall of thorax, initial encounter: Secondary | ICD-10-CM | POA: Diagnosis not present

## 2015-10-03 DIAGNOSIS — M797 Fibromyalgia: Secondary | ICD-10-CM | POA: Insufficient documentation

## 2015-10-03 DIAGNOSIS — Z8669 Personal history of other diseases of the nervous system and sense organs: Secondary | ICD-10-CM | POA: Insufficient documentation

## 2015-10-03 DIAGNOSIS — S80811A Abrasion, right lower leg, initial encounter: Secondary | ICD-10-CM | POA: Insufficient documentation

## 2015-10-03 DIAGNOSIS — Y92410 Unspecified street and highway as the place of occurrence of the external cause: Secondary | ICD-10-CM | POA: Diagnosis not present

## 2015-10-03 DIAGNOSIS — Z8679 Personal history of other diseases of the circulatory system: Secondary | ICD-10-CM | POA: Diagnosis not present

## 2015-10-03 DIAGNOSIS — S2220XA Unspecified fracture of sternum, initial encounter for closed fracture: Secondary | ICD-10-CM

## 2015-10-03 DIAGNOSIS — S199XXA Unspecified injury of neck, initial encounter: Secondary | ICD-10-CM | POA: Insufficient documentation

## 2015-10-03 DIAGNOSIS — S80812A Abrasion, left lower leg, initial encounter: Secondary | ICD-10-CM | POA: Insufficient documentation

## 2015-10-03 DIAGNOSIS — Z79899 Other long term (current) drug therapy: Secondary | ICD-10-CM | POA: Diagnosis not present

## 2015-10-03 DIAGNOSIS — S299XXA Unspecified injury of thorax, initial encounter: Secondary | ICD-10-CM | POA: Diagnosis present

## 2015-10-03 DIAGNOSIS — Z23 Encounter for immunization: Secondary | ICD-10-CM | POA: Insufficient documentation

## 2015-10-03 DIAGNOSIS — Y9389 Activity, other specified: Secondary | ICD-10-CM | POA: Insufficient documentation

## 2015-10-03 LAB — I-STAT CHEM 8, ED
BUN: 12 mg/dL (ref 6–20)
CALCIUM ION: 1.19 mmol/L (ref 1.12–1.23)
Chloride: 102 mmol/L (ref 101–111)
Creatinine, Ser: 1 mg/dL (ref 0.44–1.00)
GLUCOSE: 100 mg/dL — AB (ref 65–99)
HEMATOCRIT: 37 % (ref 36.0–46.0)
Hemoglobin: 12.6 g/dL (ref 12.0–15.0)
Potassium: 3.4 mmol/L — ABNORMAL LOW (ref 3.5–5.1)
SODIUM: 140 mmol/L (ref 135–145)
TCO2: 24 mmol/L (ref 0–100)

## 2015-10-03 MED ORDER — HYDROCODONE-ACETAMINOPHEN 5-325 MG PO TABS: 1.0000 | ORAL_TABLET | ORAL | Status: DC | PRN

## 2015-10-03 MED ORDER — HYDROCODONE-ACETAMINOPHEN 5-325 MG PO TABS
1.0000 | ORAL_TABLET | Freq: Once | ORAL | Status: AC
  Administered 2015-10-03: 1 via ORAL
  Filled 2015-10-03: qty 1

## 2015-10-03 MED ORDER — IOHEXOL 300 MG/ML  SOLN
100.0000 mL | Freq: Once | INTRAMUSCULAR | Status: AC | PRN
  Administered 2015-10-03: 100 mL via INTRAVENOUS

## 2015-10-03 MED ORDER — MORPHINE SULFATE (PF) 4 MG/ML IV SOLN
4.0000 mg | Freq: Once | INTRAVENOUS | Status: AC
  Administered 2015-10-03: 4 mg via INTRAVENOUS
  Filled 2015-10-03: qty 1

## 2015-10-03 MED ORDER — IBUPROFEN 800 MG PO TABS: 800.0000 mg | ORAL_TABLET | Freq: Three times a day (TID) | ORAL | Status: DC

## 2015-10-03 MED ORDER — TETANUS-DIPHTH-ACELL PERTUSSIS 5-2.5-18.5 LF-MCG/0.5 IM SUSP
0.5000 mL | Freq: Once | INTRAMUSCULAR | Status: AC
  Administered 2015-10-03: 0.5 mL via INTRAMUSCULAR
  Filled 2015-10-03: qty 0.5
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
## 2015-10-05 ENCOUNTER — Encounter: Payer: Self-pay | Admitting: Family Medicine

## 2015-10-05 ENCOUNTER — Ambulatory Visit (HOSPITAL_COMMUNITY)
Admission: RE | Admit: 2015-10-05 | Discharge: 2015-10-05 | Disposition: A | Discharge status: Home / Self Care | Payer: Self-pay | Source: Ambulatory Visit | Attending: Nurse Practitioner | Admitting: Nurse Practitioner

## 2015-10-05 ENCOUNTER — Encounter: Payer: Self-pay | Admitting: Nurse Practitioner

## 2015-10-05 ENCOUNTER — Ambulatory Visit (INDEPENDENT_AMBULATORY_CARE_PROVIDER_SITE_OTHER): Payer: 59 | Admitting: Nurse Practitioner

## 2015-10-05 VITALS — BP 130/90 | Wt 162.0 lb

## 2015-10-05 DIAGNOSIS — S2220XS Unspecified fracture of sternum, sequela: Secondary | ICD-10-CM | POA: Diagnosis not present

## 2015-10-05 DIAGNOSIS — S80812D Abrasion, left lower leg, subsequent encounter: Secondary | ICD-10-CM | POA: Diagnosis not present

## 2015-10-05 DIAGNOSIS — S8012XD Contusion of left lower leg, subsequent encounter: Secondary | ICD-10-CM | POA: Diagnosis not present

## 2015-10-05 DIAGNOSIS — M79605 Pain in left leg: Secondary | ICD-10-CM | POA: Insufficient documentation

## 2015-10-05 MED ORDER — TRAMADOL HCL 50 MG PO TABS: 50.0000 mg | ORAL_TABLET | Freq: Three times a day (TID) | ORAL | Status: DC | PRN

## 2015-10-06 ENCOUNTER — Encounter: Payer: Self-pay | Admitting: Nurse Practitioner

## 2015-10-06 DIAGNOSIS — S8010XA Contusion of unspecified lower leg, initial encounter: Secondary | ICD-10-CM | POA: Insufficient documentation

## 2015-10-06 DIAGNOSIS — S2220XA Unspecified fracture of sternum, initial encounter for closed fracture: Secondary | ICD-10-CM | POA: Insufficient documentation

## 2015-10-06 HISTORY — DX: Contusion of unspecified lower leg, initial encounter: S80.10XA
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
## 2015-10-08 ENCOUNTER — Telehealth: Payer: Self-pay | Admitting: Nurse Practitioner

## 2015-10-08 NOTE — Telephone Encounter (Signed)
Patient states she is still so very sore and it even still hurts to breathe. Patient says when she sneezes it is horrible. Patient due to return to work Monday but not sure if she will be able and needs to let work know.
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
## 2015-10-08 NOTE — Telephone Encounter (Signed)
Nurses please call. Thanks.

## 2015-10-08 NOTE — Telephone Encounter (Signed)
Discussed with patient. Patient advised that we can Go ahead and give her another work note 10/31-11/6. Office visit at that time if further work note needed. With a sternal fracture as we discussed this will take time to resolve. Patient verbalized understanding and will come by to pick up work note.

## 2015-10-08 NOTE — Telephone Encounter (Signed)
Go ahead and give her another work note 10/31-11/6. Office visit at that time if further work note needed. With a sternal fracture as we discussed this will take time to resolve.

## 2015-10-09 ENCOUNTER — Encounter: Payer: Self-pay | Admitting: Family Medicine

## 2015-10-15 ENCOUNTER — Ambulatory Visit (INDEPENDENT_AMBULATORY_CARE_PROVIDER_SITE_OTHER): Payer: 59 | Admitting: Nurse Practitioner

## 2015-10-15 ENCOUNTER — Encounter: Payer: Self-pay | Admitting: Family Medicine

## 2015-10-15 ENCOUNTER — Encounter: Payer: Self-pay | Admitting: Nurse Practitioner

## 2015-10-15 ENCOUNTER — Other Ambulatory Visit: Payer: Self-pay | Admitting: *Deleted

## 2015-10-15 VITALS — BP 110/78 | Ht 62.0 in | Wt 161.2 lb

## 2015-10-15 DIAGNOSIS — S80812D Abrasion, left lower leg, subsequent encounter: Secondary | ICD-10-CM

## 2015-10-15 DIAGNOSIS — Z23 Encounter for immunization: Secondary | ICD-10-CM | POA: Diagnosis not present

## 2015-10-15 DIAGNOSIS — S2220XD Unspecified fracture of sternum, subsequent encounter for fracture with routine healing: Secondary | ICD-10-CM

## 2015-10-15 DIAGNOSIS — S8012XD Contusion of left lower leg, subsequent encounter: Secondary | ICD-10-CM | POA: Diagnosis not present

## 2015-10-15 MED ORDER — IBUPROFEN 800 MG PO TABS: 800.0000 mg | ORAL_TABLET | Freq: Three times a day (TID) | ORAL | Status: DC
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
## 2015-10-20 DIAGNOSIS — Z0289 Encounter for other administrative examinations: Secondary | ICD-10-CM

## 2015-10-28 ENCOUNTER — Other Ambulatory Visit: Payer: Self-pay | Admitting: Nurse Practitioner

## 2015-11-13 ENCOUNTER — Ambulatory Visit (INDEPENDENT_AMBULATORY_CARE_PROVIDER_SITE_OTHER): Payer: 59 | Admitting: Nurse Practitioner

## 2015-11-13 ENCOUNTER — Encounter: Payer: Self-pay | Admitting: Nurse Practitioner

## 2015-11-13 VITALS — BP 118/80 | Ht 62.0 in | Wt 162.5 lb

## 2015-11-13 DIAGNOSIS — S2220XD Unspecified fracture of sternum, subsequent encounter for fracture with routine healing: Secondary | ICD-10-CM

## 2015-11-15 ENCOUNTER — Encounter: Payer: Self-pay | Admitting: Nurse Practitioner
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
## 2015-11-16 ENCOUNTER — Encounter: Payer: Self-pay | Admitting: Family Medicine

## 2015-11-20 ENCOUNTER — Telehealth: Payer: Self-pay | Admitting: Nurse Practitioner

## 2015-11-20 NOTE — Telephone Encounter (Signed)
Nichole Brown   Pt calling per your request that she did ok at work, feels sore but thinks that  From not having moved around much over the last several weeks. She will call Back about mid week next week to let you know if she still feels the same.

## 2015-11-21 NOTE — Telephone Encounter (Signed)
noted 

## 2015-11-23 ENCOUNTER — Telehealth: Payer: Self-pay | Admitting: Nurse Practitioner

## 2015-11-23 NOTE — Telephone Encounter (Signed)
There is no message.

## 2015-11-23 NOTE — Telephone Encounter (Signed)
Error

## 2015-12-02 ENCOUNTER — Ambulatory Visit (INDEPENDENT_AMBULATORY_CARE_PROVIDER_SITE_OTHER): Payer: 59 | Admitting: Family Medicine

## 2015-12-02 ENCOUNTER — Encounter: Payer: Self-pay | Admitting: Family Medicine

## 2015-12-02 VITALS — BP 110/70 | Temp 98.3°F | Ht 61.5 in | Wt 162.0 lb

## 2015-12-02 DIAGNOSIS — B9689 Other specified bacterial agents as the cause of diseases classified elsewhere: Secondary | ICD-10-CM

## 2015-12-02 DIAGNOSIS — J019 Acute sinusitis, unspecified: Secondary | ICD-10-CM | POA: Diagnosis not present

## 2015-12-02 MED ORDER — AMOXICILLIN-POT CLAVULANATE 875-125 MG PO TABS: 1.0000 | ORAL_TABLET | Freq: Two times a day (BID) | ORAL | Status: DC
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
## 2015-12-11 ENCOUNTER — Telehealth: Payer: Self-pay | Admitting: Family Medicine

## 2015-12-11 DIAGNOSIS — Z029 Encounter for administrative examinations, unspecified: Secondary | ICD-10-CM

## 2015-12-11 MED ORDER — LEVOFLOXACIN 500 MG PO TABS: 500.0000 mg | ORAL_TABLET | Freq: Every day | ORAL | Status: DC

## 2015-12-11 NOTE — Telephone Encounter (Signed)
Pt was seen 12/21 for acute bacterial rhinitis  She was told by the doc according to the patient to call Back if she was not better. Currently still stopped up, Cough (little) symptoms not as bad but not gone   [facility]

## 2015-12-11 NOTE — Telephone Encounter (Signed)
Recommend Levaquin 500 mg 1 daily for 10 days. If progressive symptoms difficulty breathing high fevers or worse emergency department, follow-up if ongoing troubles

## 2015-12-11 NOTE — Telephone Encounter (Signed)
Seen 12/21 taking augmentin. Not any better about the same. Sinus drainage and sinus pressure, having chills.

## 2015-12-11 NOTE — Telephone Encounter (Signed)
Notified patient recommend Levaquin 500 mg 1 daily for 10 days. If progressive symptoms difficulty breathing high fevers or worse emergency department, follow-up if ongoing troubles. Med sent to pharmacy. Patient was notified.

## 2016-01-05 ENCOUNTER — Encounter: Payer: Self-pay | Admitting: Family Medicine

## 2016-01-05 ENCOUNTER — Ambulatory Visit (INDEPENDENT_AMBULATORY_CARE_PROVIDER_SITE_OTHER): Payer: 59 | Admitting: Family Medicine

## 2016-01-05 VITALS — BP 100/70 | Ht 61.5 in | Wt 162.0 lb

## 2016-01-05 DIAGNOSIS — M797 Fibromyalgia: Secondary | ICD-10-CM

## 2016-01-05 DIAGNOSIS — G43001 Migraine without aura, not intractable, with status migrainosus: Secondary | ICD-10-CM

## 2016-01-05 DIAGNOSIS — Z029 Encounter for administrative examinations, unspecified: Secondary | ICD-10-CM

## 2016-01-05 MED ORDER — DULOXETINE HCL 20 MG PO CPEP: 20.0000 mg | ORAL_CAPSULE | Freq: Every day | ORAL | Status: DC

## 2016-01-05 NOTE — Progress Notes (Signed)
   Subjective:    Patient ID: Nichole Brown, female    DOB: 1966/07/14, 50 y.o.   MRN: [id number]  HPI Patient is here today for a follow up visit on migraines. Patient is doing very well. Patient states that she has no concerns at this time.   Has helped out all the way around, with bot h fibromyalgia and head aches and mood  Tries to walk up to two mil, every day   Takes tylenol, helps some   migt overall doing Review of Systems No abdominal pain no chest pain no change in bowel habits no weight loss or weight gain energy level improved    Objective:   Physical Exam Alert vital stable. Pleasant no acute distress. HEENT normal. Lungs clear heart rare rhythm neuro intact       Assessment & Plan:  Impression fibromyalgia with element of recurrent migraine headaches all substantially improved on Cymbalta plan diet exercise discussed encourage maintain Cymbalta check every 6 months in this regard WSL

## 2016-02-10 ENCOUNTER — Encounter: Payer: Self-pay | Admitting: Nurse Practitioner

## 2016-02-10 ENCOUNTER — Ambulatory Visit (INDEPENDENT_AMBULATORY_CARE_PROVIDER_SITE_OTHER): Payer: Self-pay | Admitting: Nurse Practitioner

## 2016-02-10 ENCOUNTER — Telehealth: Payer: Self-pay | Admitting: Nurse Practitioner

## 2016-02-10 VITALS — BP 116/74 | Ht 62.0 in | Wt 163.1 lb

## 2016-02-10 DIAGNOSIS — S2220XD Unspecified fracture of sternum, subsequent encounter for fracture with routine healing: Secondary | ICD-10-CM
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
## 2016-02-12 ENCOUNTER — Encounter: Payer: Self-pay | Admitting: Nurse Practitioner
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
## 2016-02-16 ENCOUNTER — Encounter: Payer: Self-pay | Admitting: Nurse Practitioner

## 2016-02-16 NOTE — Telephone Encounter (Signed)
done

## 2016-02-16 NOTE — Telephone Encounter (Signed)
Was this letter done?

## 2016-02-16 NOTE — Telephone Encounter (Signed)
Nichole Brown has letter, mailed out today

## 2016-02-17 ENCOUNTER — Encounter: Payer: Self-pay | Admitting: Family Medicine

## 2016-02-17 ENCOUNTER — Ambulatory Visit (INDEPENDENT_AMBULATORY_CARE_PROVIDER_SITE_OTHER): Payer: 59 | Admitting: Family Medicine

## 2016-02-17 VITALS — BP 114/74 | Temp 98.4°F | Ht 62.0 in | Wt 161.0 lb

## 2016-02-17 DIAGNOSIS — B349 Viral infection, unspecified: Secondary | ICD-10-CM

## 2016-02-17 DIAGNOSIS — J019 Acute sinusitis, unspecified: Secondary | ICD-10-CM | POA: Diagnosis not present

## 2016-02-17 DIAGNOSIS — B9689 Other specified bacterial agents as the cause of diseases classified elsewhere: Secondary | ICD-10-CM

## 2016-02-17 MED ORDER — AMOXICILLIN-POT CLAVULANATE 875-125 MG PO TABS: 1.0000 | ORAL_TABLET | Freq: Two times a day (BID) | ORAL | Status: DC

## 2016-02-17 NOTE — Progress Notes (Signed)
   Subjective:    Patient ID: Nichole Brown, female    DOB: 06-26-1966, 50 y.o.   MRN: [id number]  Sinusitis This is a new problem. Episode onset: 3 days. Associated symptoms include congestion, coughing, headaches and sneezing. Pertinent negatives include no ear pain or shortness of breath. Treatments tried: tylenol, migraine med, sudafed.   Sinus pressure pain discomfort. Toward the end of last week felt bad then over the past few days with head congestion sinus pressure Max sinus pain No fever Some body aches Sweats and chills [employer] ok   Review of Systems  Constitutional: Negative for fever and activity change.  HENT: Positive for congestion, rhinorrhea and sneezing. Negative for ear pain.   Eyes: Negative for discharge.  Respiratory: Positive for cough. Negative for shortness of breath and wheezing.   Cardiovascular: Negative for chest pain.  Neurological: Positive for headaches.       Objective:   Physical Exam  Constitutional: She appears well-developed.  HENT:  Head: Normocephalic.  Nose: Nose normal.  Mouth/Throat: Oropharynx is clear and moist. No oropharyngeal exudate.  Neck: Neck supple.  Cardiovascular: Normal rate and normal heart sounds.   No murmur heard. Pulmonary/Chest: Effort normal and breath sounds normal. She has no wheezes.  Lymphadenopathy:    She has no cervical adenopathy.  Skin: Skin is warm and dry.  Nursing note and vitals reviewed.  She does not appear toxic I believe the patient had viral syndrome that open up the daughter sinus infection      Assessment & Plan:  Patient was seen today for upper respiratory illness. It is felt that the patient is dealing with sinusitis. Antibiotics were prescribed today. Importance of compliance with medication was discussed. Symptoms should gradually resolve over the course of the next several days. If high fevers, progressive illness, difficulty breathing, worsening condition or failure for symptoms to  improve over the next several days then the patient is to follow-up. If any emergent conditions the patient is to follow-up in the emergency department otherwise to follow-up in the office.

## 2016-03-28 ENCOUNTER — Other Ambulatory Visit: Payer: Self-pay | Admitting: *Deleted

## 2016-03-28 ENCOUNTER — Ambulatory Visit: Payer: 59 | Admitting: Family Medicine

## 2016-03-28 ENCOUNTER — Telehealth: Payer: Self-pay | Admitting: Family Medicine

## 2016-03-28 MED ORDER — CEFDINIR 300 MG PO CAPS: 300.0000 mg | ORAL_CAPSULE | Freq: Two times a day (BID) | ORAL | Status: DC

## 2016-03-28 NOTE — Telephone Encounter (Signed)
omnicef 300 bid ten d 

## 2016-03-28 NOTE — Telephone Encounter (Signed)
Having chills and feeling hot. Has not checked temp. Had difficulty breathing the other night at work while walking up some stairs. No trouble breathing today but chest feels tight. Pt cannot come in today.

## 2016-03-28 NOTE — Telephone Encounter (Signed)
Med sent to pharm. Pt notified.  

## 2016-03-28 NOTE — Telephone Encounter (Signed)
Patient had to cancel her appointment today due to transportation issues.  She was seen for rhinosinusitis on 02/17/16 and prescribed amoxicillan.  She said she is having cough, sneezing, runny nose, right ear pain, head congestion, headache again.  She has been taking claritan D and amoxicillan she was given last month.  She wants to know if we can send in another antibiotic since she was seen last month.     [place]

## 2016-04-07 DIAGNOSIS — Z029 Encounter for administrative examinations, unspecified: Secondary | ICD-10-CM

## 2016-04-27 ENCOUNTER — Telehealth: Payer: Self-pay | Admitting: Family Medicine

## 2016-04-27 DIAGNOSIS — Z13220 Encounter for screening for lipoid disorders: Secondary | ICD-10-CM

## 2016-04-27 DIAGNOSIS — Z1231 Encounter for screening mammogram for malignant neoplasm of breast: Secondary | ICD-10-CM

## 2016-04-27 DIAGNOSIS — Z139 Encounter for screening, unspecified: Secondary | ICD-10-CM

## 2016-04-27 DIAGNOSIS — R5383 Other fatigue: Secondary | ICD-10-CM

## 2016-04-27 NOTE — Telephone Encounter (Signed)
Pt is wanting a diagnostic mammogram done due to her mom just being diagnosed with breast cancer. Pt is also requesting lab orders to be sent over for upcoming wellness visit. Last labs per epic were: cbc,tsh and bmp on 08/04/15

## 2016-04-28 ENCOUNTER — Other Ambulatory Visit: Payer: Self-pay | Admitting: *Deleted

## 2016-04-28 DIAGNOSIS — Z13220 Encounter for screening for lipoid disorders: Secondary | ICD-10-CM

## 2016-04-28 DIAGNOSIS — R5383 Other fatigue: Secondary | ICD-10-CM

## 2016-04-28 DIAGNOSIS — Z139 Encounter for screening, unspecified: Secondary | ICD-10-CM

## 2016-04-28 NOTE — Telephone Encounter (Signed)
bw orders put in for [facility]. Pt wanted to do them where she works. Unable to do diagnostic mammo bc she is not having any issues and last mammo in 2015 was normal. Pt can do 3d mammo for $50 extra dollars. Pt does want 3 D. 3 D mammo scheduled at [facility] may 23rd at 3:40 pm. Pt notified of appt and that bw orders are ready.

## 2016-04-28 NOTE — Telephone Encounter (Signed)
sched mammo, lets do met liv tsh cbc lip

## 2016-04-29 ENCOUNTER — Other Ambulatory Visit
Admission: RE | Admit: 2016-04-29 | Discharge: 2016-04-29 | Disposition: A | Discharge status: Home / Self Care | Payer: 59 | Source: Ambulatory Visit | Attending: Family Medicine | Admitting: Family Medicine

## 2016-04-29 DIAGNOSIS — E785 Hyperlipidemia, unspecified: Secondary | ICD-10-CM | POA: Insufficient documentation

## 2016-04-29 DIAGNOSIS — R5383 Other fatigue: Secondary | ICD-10-CM | POA: Diagnosis not present

## 2016-04-29 LAB — CBC WITH DIFFERENTIAL/PLATELET
Basophils Absolute: 0.1 10*3/uL (ref 0–0.1)
Eosinophils Absolute: 0.5 10*3/uL (ref 0–0.7)
Eosinophils Relative: 5 %
HEMATOCRIT: 39 % (ref 35.0–47.0)
HEMOGLOBIN: 13 g/dL (ref 12.0–16.0)
LYMPHS ABS: 4.4 10*3/uL — AB (ref 1.0–3.6)
Lymphocytes Relative: 40 %
MCH: 29.4 pg (ref 26.0–34.0)
MCHC: 33.4 g/dL (ref 32.0–36.0)
MCV: 88 fL (ref 80.0–100.0)
MONO ABS: 0.9 10*3/uL (ref 0.2–0.9)
NEUTROS ABS: 5.2 10*3/uL (ref 1.4–6.5)
Platelets: 213 10*3/uL (ref 150–440)
RBC: 4.43 MIL/uL (ref 3.80–5.20)
RDW: 13.5 % (ref 11.5–14.5)
WBC: 11.1 10*3/uL — ABNORMAL HIGH (ref 3.6–11.0)

## 2016-04-29 LAB — BASIC METABOLIC PANEL
ANION GAP: 7 (ref 5–15)
BUN: 20 mg/dL (ref 6–20)
CALCIUM: 9.1 mg/dL (ref 8.9–10.3)
CO2: 26 mmol/L (ref 22–32)
CREATININE: 0.65 mg/dL (ref 0.44–1.00)
Chloride: 106 mmol/L (ref 101–111)
Glucose, Bld: 97 mg/dL (ref 65–99)
Potassium: 3.8 mmol/L (ref 3.5–5.1)
SODIUM: 139 mmol/L (ref 135–145)

## 2016-04-29 LAB — HEPATIC FUNCTION PANEL
ALBUMIN: 4.1 g/dL (ref 3.5–5.0)
ALT: 23 U/L (ref 14–54)
AST: 17 U/L (ref 15–41)
Alkaline Phosphatase: 69 U/L (ref 38–126)
Total Bilirubin: 0.6 mg/dL (ref 0.3–1.2)
Total Protein: 6.9 g/dL (ref 6.5–8.1)

## 2016-04-29 LAB — LIPID PANEL
CHOLESTEROL: 169 mg/dL (ref 0–200)
HDL: 47 mg/dL (ref >40)
LDL CALC: 98 mg/dL (ref 0–99)
TRIGLYCERIDES: 120 mg/dL (ref <150)
Total CHOL/HDL Ratio: 3.6 RATIO
VLDL: 24 mg/dL (ref 0–40)

## 2016-04-29 LAB — TSH: TSH: 4.772 u[IU]/mL — ABNORMAL HIGH (ref 0.350–4.500)

## 2016-05-03 ENCOUNTER — Ambulatory Visit
Admission: RE | Admit: 2016-05-03 | Discharge: 2016-05-03 | Disposition: A | Discharge status: Home / Self Care | Payer: 59 | Source: Ambulatory Visit | Attending: Family Medicine | Admitting: Family Medicine

## 2016-05-03 DIAGNOSIS — Z1231 Encounter for screening mammogram for malignant neoplasm of breast: Secondary | ICD-10-CM | POA: Insufficient documentation

## 2016-05-04 ENCOUNTER — Other Ambulatory Visit: Payer: Self-pay | Admitting: Family Medicine

## 2016-05-04 NOTE — Telephone Encounter (Signed)
Ok plus three ref 

## 2016-05-23 ENCOUNTER — Ambulatory Visit (INDEPENDENT_AMBULATORY_CARE_PROVIDER_SITE_OTHER): Payer: 59 | Admitting: Nurse Practitioner

## 2016-05-23 ENCOUNTER — Encounter: Payer: Self-pay | Admitting: Nurse Practitioner

## 2016-05-23 VITALS — BP 102/70 | Ht 62.0 in | Wt 161.5 lb

## 2016-05-23 DIAGNOSIS — R7989 Other specified abnormal findings of blood chemistry: Secondary | ICD-10-CM

## 2016-05-23 DIAGNOSIS — R946 Abnormal results of thyroid function studies: Secondary | ICD-10-CM

## 2016-05-23 DIAGNOSIS — Z0000 Encounter for general adult medical examination without abnormal findings: Secondary | ICD-10-CM

## 2016-05-23 DIAGNOSIS — Z01419 Encounter for gynecological examination (general) (routine) without abnormal findings: Secondary | ICD-10-CM

## 2016-05-23 MED ORDER — DULOXETINE HCL 20 MG PO CPEP: 20.0000 mg | ORAL_CAPSULE | Freq: Every day | ORAL | Status: DC

## 2016-05-24 ENCOUNTER — Encounter: Payer: Self-pay | Admitting: Nurse Practitioner

## 2016-05-24 NOTE — Progress Notes (Signed)
   Subjective:    Patient ID: Nichole Brown, female    DOB: [birth year], 50 y.o.   MRN: [id number]  HPI Presents for her wellness exam. Has had a hysterectomy, no oophorectomy. No pelvic pain. Same sexual partner. Regular vision and dental exams. Regular activity.     Review of Systems  Constitutional: Negative for activity change, appetite change and fatigue.  HENT: Negative for dental problem, ear pain, sinus pressure, sore throat and trouble swallowing.   Respiratory: Negative for cough, chest tightness, shortness of breath and wheezing.   Cardiovascular: Negative for chest pain.  Gastrointestinal: Negative for nausea, vomiting, abdominal pain, diarrhea, constipation and abdominal distention.  Genitourinary: Negative for dysuria, urgency, frequency, vaginal discharge, enuresis, difficulty urinating, genital sores and pelvic pain.       Objective:   Physical Exam  Constitutional: She is oriented to person, place, and time. She appears well-developed. No distress.  HENT:  Right Ear: External ear normal.  Left Ear: External ear normal.  Mouth/Throat: Oropharynx is clear and moist.  Neck: Normal range of motion. Neck supple. No tracheal deviation present. No thyromegaly present.  Cardiovascular: Normal rate, regular rhythm and normal heart sounds.  Exam reveals no gallop.   No murmur heard. Pulmonary/Chest: Effort normal and breath sounds normal.  Abdominal: Soft. She exhibits no distension. There is no tenderness.  Genitourinary: Vagina normal. No vaginal discharge found.  External GU: no rashes or lesions. Vagina: no discharge. Bimanual exam: no tenderness or obvious masses. No rectal exam or hemoccult cards; plans colonoscopy.   Musculoskeletal: She exhibits no edema.  Lymphadenopathy:    She has no cervical adenopathy.  Neurological: She is alert and oriented to person, place, and time.  Skin: Skin is warm and dry. No rash noted.  Psychiatric: She has a normal mood and  affect. Her behavior is normal.  Vitals reviewed. Breast exam: no masses; axillae no adenopathy.  Abnormal TSH on 04/29/16 at 4.7.       Assessment & Plan:  Well woman exam  Abnormal thyroid blood test - Plan: TSH  Given information on colonoscopy. Patient plans to schedule her own appointment in [place]. Repeat TSH in 3 months. Recommend daily vitamin D and calcium supplement.  Return in about 6 months (around 11/22/2016) for recheck.

## 2016-06-01 DIAGNOSIS — H524 Presbyopia: Secondary | ICD-10-CM | POA: Diagnosis not present

## 2016-06-01 DIAGNOSIS — H2513 Age-related nuclear cataract, bilateral: Secondary | ICD-10-CM | POA: Diagnosis not present

## 2016-06-01 DIAGNOSIS — H5213 Myopia, bilateral: Secondary | ICD-10-CM | POA: Diagnosis not present

## 2016-06-01 DIAGNOSIS — H52223 Regular astigmatism, bilateral: Secondary | ICD-10-CM | POA: Diagnosis not present

## 2016-06-28 ENCOUNTER — Encounter: Payer: Self-pay | Admitting: Nurse Practitioner

## 2016-06-28 ENCOUNTER — Ambulatory Visit (INDEPENDENT_AMBULATORY_CARE_PROVIDER_SITE_OTHER): Payer: 59 | Admitting: Nurse Practitioner

## 2016-06-28 VITALS — BP 118/70 | Ht 62.0 in | Wt 161.2 lb

## 2016-06-28 DIAGNOSIS — Z029 Encounter for administrative examinations, unspecified: Secondary | ICD-10-CM

## 2016-06-28 DIAGNOSIS — G43001 Migraine without aura, not intractable, with status migrainosus: Secondary | ICD-10-CM

## 2016-06-28 MED ORDER — DULOXETINE HCL 30 MG PO CPEP: 30.0000 mg | ORAL_CAPSULE | Freq: Every day | ORAL | Status: DC

## 2016-06-30 ENCOUNTER — Encounter: Payer: Self-pay | Admitting: Nurse Practitioner

## 2016-06-30 NOTE — Progress Notes (Signed)
Subjective:  Presents for recheck on her migraines. Needs updated FMLA but does not have form with her today. Lost her son on 7/1 to drug overdose; had a history of addiction. Has an excellent support system. Having headaches 3-4 times per week. Describes them as mild migraines. Rarely misses work. No change in symptomatology. Currently on Cymbalta 20 mg per day.   Objective:   BP 118/70 mmHg  Ht 5\' 2"  (1.575 m)  Wt 161 lb 4 oz (73.143 kg)  BMI 29.49 kg/m2 NAD. Alert, oriented. Calm affect. Thoughts logical, coherent and relevant. Lungs clear. Heart RRR.   Assessment:  Problem List Items Addressed This Visit      Cardiovascular and Mediastinum   Migraine headache - Primary   Relevant Medications   DULoxetine (CYMBALTA) 30 MG capsule     Plan:  Meds ordered this encounter  Medications  . valACYclovir (VALTREX) 1000 MG tablet    Sig: Take 1,000 mg by mouth 2 (two) times daily.  . DULoxetine (CYMBALTA) 30 MG capsule    Sig: Take 1 capsule (30 mg total) by mouth daily.    Dispense:  90 capsule    Refill:  1    Order Specific Question:  Supervising Provider    Answer:  Mikey Kirschner [[id number]]   Increase cymbalta to 30 mg per day. Go back to 20 mg dose if any problems.  Return in about 6 months (around 12/29/2016) for recheck. Call back sooner if any problems.

## 2016-07-04 ENCOUNTER — Ambulatory Visit: Payer: 59 | Admitting: Family Medicine

## 2016-07-29 ENCOUNTER — Telehealth: Payer: Self-pay | Admitting: Family Medicine

## 2016-07-29 MED ORDER — HYDROCODONE-HOMATROPINE 5-1.5 MG/5ML PO SYRP: 5.0000 mL | ORAL_SOLUTION | Freq: Every evening | ORAL | 0 refills | Status: DC | PRN

## 2016-07-29 NOTE — Telephone Encounter (Signed)
Hycodan three oz one tspn qhs prn cough 

## 2016-07-29 NOTE — Telephone Encounter (Signed)
Patient requesting something called in for real bad cough especially at night. She states mouth stays dry. [place].

## 2016-07-29 NOTE — Telephone Encounter (Signed)
Patient notified script ready for pick up

## 2016-08-08 ENCOUNTER — Encounter: Payer: Self-pay | Admitting: Family Medicine

## 2016-08-08 ENCOUNTER — Ambulatory Visit (INDEPENDENT_AMBULATORY_CARE_PROVIDER_SITE_OTHER): Payer: 59 | Admitting: Family Medicine

## 2016-08-08 VITALS — BP 112/68 | Temp 98.7°F | Ht 62.0 in | Wt 164.0 lb

## 2016-08-08 DIAGNOSIS — B9689 Other specified bacterial agents as the cause of diseases classified elsewhere: Secondary | ICD-10-CM

## 2016-08-08 DIAGNOSIS — J019 Acute sinusitis, unspecified: Secondary | ICD-10-CM

## 2016-08-08 DIAGNOSIS — M25512 Pain in left shoulder: Secondary | ICD-10-CM | POA: Diagnosis not present

## 2016-08-08 MED ORDER — PREDNISONE 20 MG PO TABS: ORAL_TABLET | ORAL | 0 refills | Status: DC

## 2016-08-08 MED ORDER — AMOXICILLIN-POT CLAVULANATE 875-125 MG PO TABS: 1.0000 | ORAL_TABLET | Freq: Two times a day (BID) | ORAL | 0 refills | Status: DC

## 2016-08-08 NOTE — Progress Notes (Signed)
   Subjective:    Patient ID: Nichole Brown, female    DOB: 15-Oct-1966, 50 y.o.   MRN: [id number]  Cough  This is a new problem. Episode onset: one month. Associated symptoms include ear pain, headaches and a sore throat. Associated symptoms comments: congestion.  cough more at night then day, pps [roductive Headache frontal positive nasal discharge  Left shoulder pain. Started one month ago.Recalls no sudden injury. Worse with certain motions. Surgery 5 years ago        Review of Systems  HENT: Positive for ear pain and sore throat.   Respiratory: Positive for cough.   Neurological: Positive for headaches.       Objective:   Physical Exam  Alert, mild malaise. Hydration good Vitals stable. frontal/ maxillary tenderness evident positive nasal congestion. pharynx normal neck supple  lungs clear/no crackles or wheezes. heart regular in rhythm Left shoulder positive impingement sign no deltoid tenderness no deformity      Assessment & Plan:  Impression 1 rhinosinusitis/bronchitis #2 impingement syndrome left shoulder plan prednisone taper prescribed. Antibiotics prescribed. Symptom care discussed WSL

## 2016-08-11 ENCOUNTER — Telehealth: Payer: Self-pay | Admitting: Family Medicine

## 2016-08-11 NOTE — Telephone Encounter (Signed)
ok 

## 2016-08-11 NOTE — Telephone Encounter (Signed)
Patient requesting work excuse for 8/29 thru 9/5 suffering with a real bad migraine and couldn't work.

## 2016-09-16 ENCOUNTER — Ambulatory Visit: Payer: 59 | Admitting: Nurse Practitioner

## 2016-09-23 ENCOUNTER — Encounter: Payer: Self-pay | Admitting: Nurse Practitioner

## 2016-09-23 ENCOUNTER — Ambulatory Visit (INDEPENDENT_AMBULATORY_CARE_PROVIDER_SITE_OTHER): Payer: 59 | Admitting: Nurse Practitioner

## 2016-09-23 VITALS — BP 122/82 | Ht 62.0 in | Wt 165.2 lb

## 2016-09-23 DIAGNOSIS — M7582 Other shoulder lesions, left shoulder: Secondary | ICD-10-CM | POA: Diagnosis not present

## 2016-09-23 DIAGNOSIS — G43009 Migraine without aura, not intractable, without status migrainosus: Secondary | ICD-10-CM

## 2016-09-23 DIAGNOSIS — S29012A Strain of muscle and tendon of back wall of thorax, initial encounter: Secondary | ICD-10-CM

## 2016-09-23 DIAGNOSIS — M778 Other enthesopathies, not elsewhere classified: Secondary | ICD-10-CM

## 2016-09-23 MED ORDER — METHOCARBAMOL 500 MG PO TABS: 500.0000 mg | ORAL_TABLET | Freq: Three times a day (TID) | ORAL | 0 refills | Status: DC | PRN

## 2016-09-23 MED ORDER — VALACYCLOVIR HCL 1 G PO TABS: 1000.0000 mg | ORAL_TABLET | Freq: Two times a day (BID) | ORAL | 2 refills | Status: DC

## 2016-09-23 MED ORDER — NAPROXEN 500 MG PO TABS: 500.0000 mg | ORAL_TABLET | Freq: Two times a day (BID) | ORAL | 0 refills | Status: DC

## 2016-09-23 NOTE — Patient Instructions (Signed)
Lidocaine patches Biofreeze Icy Hot Smart Relief TENS unit

## 2016-09-24 ENCOUNTER — Encounter: Payer: Self-pay | Admitting: Nurse Practitioner

## 2016-09-24 NOTE — Progress Notes (Signed)
Subjective:  Presents for recheck on migraines. Much improved on Cymbalta at current dose. Also has started therapy since the recent loss of her son. Limited use of Xanax. Has had a flare up of her left shoulder pain for about 2 months. No specific history of injury. Worse with certain movements. Doing shoulder exercises and applying ice/heat.  Objective:   BP 122/82   Ht 5\' 2"  (1.575 m)   Wt 165 lb 3.2 oz (74.9 kg)   BMI 30.22 kg/m  NAD. Alert, oriented. Lungs clear. Heart RRR. Tenderness in anterior and mid shoulder. Distinct tender, tight muscles upper left back area. Can perform ROM of the left shoulder with difficulty especially with rotation about shoulder height. Arm and hand strength 5+ bilat.   Assessment:  Problem List Items Addressed This Visit      Cardiovascular and Mediastinum   Migraine headache - Primary   Relevant Medications   naproxen (NAPROSYN) 500 MG tablet   methocarbamol (ROBAXIN) 500 MG tablet    Other Visit Diagnoses    Left shoulder tendinitis       Relevant Orders   Ambulatory referral to Orthopedic Surgery   Upper back strain, initial encounter           Plan:  Meds ordered this encounter  Medications  . valACYclovir (VALTREX) 1000 MG tablet    Sig: Take 1 tablet (1,000 mg total) by mouth 2 (two) times daily.    Dispense:  60 tablet    Refill:  2    Order Specific Question:   Supervising Provider    Answer:   Mikey Kirschner [[id number]]  . naproxen (NAPROSYN) 500 MG tablet    Sig: Take 1 tablet (500 mg total) by mouth 2 (two) times daily with a meal.    Dispense:  30 tablet    Refill:  0    Order Specific Question:   Supervising Provider    Answer:   Mikey Kirschner [[id number]]  . methocarbamol (ROBAXIN) 500 MG tablet    Sig: Take 1 tablet (500 mg total) by mouth every 8 (eight) hours as needed for muscle spasms.    Dispense:  30 tablet    Refill:  0    Order Specific Question:   Supervising Provider    Answer:   Mikey Kirschner [[id number]]    Lidocaine patches Biofreeze Icy Hot Smart Relief TENS unit  Refer to orthopedic specialist for evaluation. Return in about 6 months (around 03/24/2017) for recheck.

## 2016-09-27 ENCOUNTER — Telehealth: Payer: Self-pay | Admitting: *Deleted

## 2016-09-27 ENCOUNTER — Encounter: Payer: Self-pay | Admitting: Nurse Practitioner

## 2016-09-27 NOTE — Telephone Encounter (Signed)
Hello, Nichole Brown, This is Nichole Brown, I was seen by you on Friday Sep 23, 2016. You had mentioned that I had muscle spasms that will cause migraines. Will the medication that you gave me eventually help with relieving those? The last migraine that I had on October 15th, was different. It was very intense to the point that I passed out and I could smell a very strong burning smell for the next two days to follow and my vision has been very blurry. If you can contact me at [phone] when you get a chance to go over this email it would be greatly appreciated! Thank you!  Called pt to let her know carolyn is out of office and with these symptoms she should go directly to Ed. Pt states she usually feels this way after having a migraine but doesn't usually last this long. Does not have migraine today. It was on Sunday and passed out on Sunday. Smelled something burning on Sunday. Today having blurry vision and feeling nervous. Pt is at work. Works at [employer] her to go to ED. Pt states she will if she feels worse.

## 2016-09-28 NOTE — Telephone Encounter (Signed)
Left message return call 09/28/16

## 2016-09-28 NOTE — Telephone Encounter (Signed)
I agree. With such a dramatic change in symptomatology we need to watch this closely. Has she seen a headache specialist in the past?

## 2016-09-29 NOTE — Telephone Encounter (Signed)
Spoke with patient and informed her per [facility] such a dramatic change in symptomatology we need to watch this closely. Patient verbalized understanding and stated that she has not seen a headache specialist in the past.

## 2016-09-29 NOTE — Telephone Encounter (Signed)
Her CT scan of the head from 2016 was basically normal except for some degenerative changes in the spine. So her pain is mostly muscle spasms but may also be some nerve or arthritis changes in the neck as well. Has she had any further unusual headaches?

## 2016-09-29 NOTE — Telephone Encounter (Signed)
[facility] 09/29/16

## 2016-10-03 ENCOUNTER — Telehealth: Payer: Self-pay | Admitting: Family Medicine

## 2016-10-03 NOTE — Telephone Encounter (Signed)
Pt states no more unusual headaches but has had the one bad migraine that prompted the original email/phone call Pt states she's had a bad headache since then and she's been shakey, nervous, and dizzy Pt states she would like a referral to Dr. Blanch Media on [address] (her nephew sees him for migraines) [phone]  If ok to refer, please initiate referral in system so that I may process

## 2016-10-04 ENCOUNTER — Other Ambulatory Visit: Payer: Self-pay | Admitting: Nurse Practitioner

## 2016-10-04 DIAGNOSIS — G43019 Migraine without aura, intractable, without status migrainosus: Secondary | ICD-10-CM

## 2016-10-04 NOTE — Telephone Encounter (Signed)
Done

## 2016-10-05 NOTE — Telephone Encounter (Signed)
Left message to return call 

## 2016-10-06 NOTE — Telephone Encounter (Signed)
Still having headaches but not migraines but is having dizziness to the point of taking dramamine.

## 2016-10-06 NOTE — Telephone Encounter (Signed)
Please remind her to get TSH repeated; non fasting; let's see if that is a factor since it was abnormal on last labs.

## 2016-10-06 NOTE — Telephone Encounter (Signed)
Advised patient Nichole Brown wants her to get TSH repeated; non fasting; let's see if that is a factor since it was abnormal on last labs. Patient verbalized understanding. Order in [place].

## 2016-10-07 ENCOUNTER — Other Ambulatory Visit
Admission: RE | Admit: 2016-10-07 | Discharge: 2016-10-07 | Disposition: A | Discharge status: Home / Self Care | Payer: 59 | Source: Ambulatory Visit | Attending: Nurse Practitioner | Admitting: Nurse Practitioner

## 2016-10-07 DIAGNOSIS — E0789 Other specified disorders of thyroid: Secondary | ICD-10-CM | POA: Insufficient documentation

## 2016-10-07 LAB — TSH: TSH: 1.98 u[IU]/mL (ref 0.350–4.500)

## 2016-10-14 ENCOUNTER — Encounter: Payer: Self-pay | Admitting: Family Medicine

## 2016-10-17 ENCOUNTER — Emergency Department (HOSPITAL_COMMUNITY)
Admission: EM | Admit: 2016-10-17 | Discharge: 2016-10-18 | Disposition: A | Discharge status: Home / Self Care | Payer: 59 | Attending: Emergency Medicine | Admitting: Emergency Medicine

## 2016-10-17 ENCOUNTER — Emergency Department (HOSPITAL_COMMUNITY): Payer: 59

## 2016-10-17 ENCOUNTER — Encounter (HOSPITAL_COMMUNITY): Payer: Self-pay | Admitting: Emergency Medicine

## 2016-10-17 DIAGNOSIS — Z79891 Long term (current) use of opiate analgesic: Secondary | ICD-10-CM | POA: Diagnosis not present

## 2016-10-17 DIAGNOSIS — M25531 Pain in right wrist: Secondary | ICD-10-CM | POA: Diagnosis present

## 2016-10-17 DIAGNOSIS — G629 Polyneuropathy, unspecified: Secondary | ICD-10-CM | POA: Insufficient documentation

## 2016-10-17 DIAGNOSIS — R0602 Shortness of breath: Secondary | ICD-10-CM | POA: Diagnosis not present

## 2016-10-17 DIAGNOSIS — R079 Chest pain, unspecified: Secondary | ICD-10-CM | POA: Diagnosis not present

## 2016-10-17 DIAGNOSIS — Z79899 Other long term (current) drug therapy: Secondary | ICD-10-CM | POA: Diagnosis not present

## 2016-10-17 DIAGNOSIS — R0789 Other chest pain: Secondary | ICD-10-CM | POA: Diagnosis not present

## 2016-10-17 HISTORY — DX: Major depressive disorder, single episode, unspecified: F32.9

## 2016-10-17 HISTORY — DX: Anxiety disorder, unspecified: F41.9

## 2016-10-17 HISTORY — DX: Depression, unspecified: F32.A

## 2016-10-17 LAB — BASIC METABOLIC PANEL
Anion gap: 5 (ref 5–15)
BUN: 16 mg/dL (ref 6–20)
CHLORIDE: 105 mmol/L (ref 101–111)
CO2: 29 mmol/L (ref 22–32)
CREATININE: 0.7 mg/dL (ref 0.44–1.00)
Calcium: 9.2 mg/dL (ref 8.9–10.3)
GFR calc non Af Amer: >60 mL/min (ref >60)
Glucose, Bld: 102 mg/dL — ABNORMAL HIGH (ref 65–99)
POTASSIUM: 3.7 mmol/L (ref 3.5–5.1)
SODIUM: 139 mmol/L (ref 135–145)

## 2016-10-17 LAB — CBC
HEMATOCRIT: 39.1 % (ref 36.0–46.0)
Hemoglobin: 13 g/dL (ref 12.0–15.0)
MCH: 29.6 pg (ref 26.0–34.0)
MCHC: 33.2 g/dL (ref 30.0–36.0)
MCV: 89.1 fL (ref 78.0–100.0)
Platelets: 247 10*3/uL (ref 150–400)
RBC: 4.39 MIL/uL (ref 3.87–5.11)
RDW: 13.2 % (ref 11.5–15.5)
WBC: 10.1 10*3/uL (ref 4.0–10.5)

## 2016-10-17 LAB — TROPONIN I: Troponin I: <0.03 ng/mL (ref <0.03)

## 2016-10-17 MED ORDER — METOCLOPRAMIDE HCL 5 MG/ML IJ SOLN
10.0000 mg | Freq: Once | INTRAMUSCULAR | Status: AC
  Administered 2016-10-18: 10 mg via INTRAVENOUS
  Filled 2016-10-17: qty 2

## 2016-10-17 MED ORDER — KETOROLAC TROMETHAMINE 30 MG/ML IJ SOLN
30.0000 mg | Freq: Once | INTRAMUSCULAR | Status: AC
  Administered 2016-10-18: 30 mg via INTRAVENOUS
  Filled 2016-10-17: qty 1

## 2016-10-17 NOTE — ED Triage Notes (Signed)
Pt states that she has been having chest pain/pressure for 2 hours with shooting pain and numbness into right arm.  C/o weakness, dizziness, nausea, and diaphoresis

## 2016-10-17 NOTE — ED Notes (Signed)
Assist pt to bathroom 

## 2016-10-17 NOTE — ED Provider Notes (Signed)
[facility] DEPT Provider Note   CSN: [id number] Arrival date & time: 10/17/16  2023  By signing my name below, I, Royce Macadamia, attest that this documentation has been prepared under the direction and in the presence of Orpah Greek, MD . Electronically Signed: Royce Macadamia, Scribe. 10/17/2016. 11:53 PM.  History   Chief Complaint Chief Complaint  Patient presents with  . Chest Pain   The history is provided by the patient and a relative. No language interpreter was used.    HPI Comments:  Nichole Brown is a 50 y.o. female with history of bronchitis who presents to the Emergency Department complaining of sharp electric pain that begins around her right wrist and radiates up her arm onset last night.  She adds that it feels like there is something tight around her arm.  Pt is able to perform her normal tasks, but states she occasionally drops something when she has a surge of pain.  Her pain is waking her up at night.  She iced the arm with relief.  She denies weakness.   She also complains of chest pain onset today.  She describes her pain as heavy.  Daughter reports that she was having trouble catching her breath and was flushed.     Past Medical History:  Diagnosis Date  . Anxiety   . Depression   . Fibromyalgia 2005  . Insomnia   . Migraine headache     Patient Active Problem List   Diagnosis Date Noted  . Sternal fracture 10/06/2015  . Contusion of leg 10/06/2015  . Migraine headache 07/24/2013  . Insomnia 05/30/2013  . Fibromyalgia 05/30/2013    Past Surgical History:  Procedure Laterality Date  . ABDOMINAL HYSTERECTOMY    . CESAREAN SECTION    . CHOLECYSTECTOMY  1988  . open heart surgery  1967  . SHOULDER SURGERY Left 2008   AC joint    OB History    No data available       Home Medications    Prior to Admission medications   Medication Sig Start Date End Date Taking? Authorizing Provider  DULoxetine (CYMBALTA) 30 MG  capsule Take 1 capsule (30 mg total) by mouth daily. 06/28/16  Yes Nilda Simmer, NP  hydroxypropyl methylcellulose / hypromellose (ISOPTO TEARS / GONIOVISC) 2.5 % ophthalmic solution Place 1 drop into both eyes daily.   Yes Historical Provider, MD  naproxen (NAPROSYN) 500 MG tablet Take 1 tablet (500 mg total) by mouth 2 (two) times daily with a meal. 09/23/16  Yes Nilda Simmer, NP  valACYclovir (VALTREX) 1000 MG tablet Take 1 tablet (1,000 mg total) by mouth 2 (two) times daily. 09/23/16  Yes Nilda Simmer, NP  ALPRAZolam Duanne Moron) 0.5 MG tablet TAKE ONE TABLET AT BEDTIME AS NEEDED FORSLEEP Patient not taking: Reported on 10/17/2016 05/05/16   Mikey Kirschner, MD  gabapentin (NEURONTIN) 300 MG capsule Take 1 capsule (300 mg total) by mouth 2 (two) times daily. 10/18/16   Orpah Greek, MD  methocarbamol (ROBAXIN) 500 MG tablet Take 1 tablet (500 mg total) by mouth every 8 (eight) hours as needed for muscle spasms. Patient not taking: Reported on 10/17/2016 09/23/16   Nilda Simmer, NP  traMADol (ULTRAM) 50 MG tablet Take 1 tablet (50 mg total) by mouth every 6 (six) hours as needed. 10/18/16   Orpah Greek, MD    Family History Family History  Problem Relation Age of Onset  . Hypertension Maternal Grandmother   .  Diabetes Maternal Grandmother   . Heart disease Maternal Grandmother 40    MI  . Hypertension Maternal Grandfather   . Throat cancer Maternal Grandfather   . Breast cancer Mother 50    Social History Social History  Substance Use Topics  . Smoking status: Never Smoker  . Smokeless tobacco: Never Used  . Alcohol use Yes     Comment: occassionally     Allergies   Topamax [topiramate]   Review of Systems Review of Systems  Respiratory: Positive for shortness of breath.   Cardiovascular: Positive for chest pain.  Musculoskeletal: Positive for arthralgias and myalgias.  All other systems reviewed and are negative.    Physical  Exam Updated Vital Signs BP 100/71   Pulse 66   Temp 97.8 F (36.6 C) (Oral)   Resp 16   Ht 5\' 2"  (1.575 m)   Wt 145 lb (65.8 kg)   SpO2 96%   BMI 26.52 kg/m   Physical Exam  Constitutional: She is oriented to person, place, and time. She appears well-developed and well-nourished. No distress.  HENT:  Head: Normocephalic and atraumatic.  Eyes: Conjunctivae are normal.  Cardiovascular: Normal rate.   Pulmonary/Chest: Effort normal.  Musculoskeletal: She exhibits tenderness.  Tender to palpation and positive Tinel right wrist.    Neurological: She is alert and oriented to person, place, and time.  Skin: Skin is warm and dry.  Psychiatric: She has a normal mood and affect.  Nursing note and vitals reviewed.    ED Treatments / Results   DIAGNOSTIC STUDIES:  Oxygen Saturation is 95% on RA, NML by my interpretation.    COORDINATION OF CARE:  11:46 PM Discussed treatment plan with pt at bedside and pt agreed to plan.  Labs (all labs ordered are listed, but only abnormal results are displayed) Labs Reviewed  BASIC METABOLIC PANEL - Abnormal; Notable for the following:       Result Value   Glucose, Bld 102 (*)    All other components within normal limits  CBC  TROPONIN I  D-DIMER, QUANTITATIVE (NOT AT [facility])    EKG  EKG Interpretation  Date/Time:  Monday October 17 2016 20:29:53 EST Ventricular Rate:  67 PR Interval:  136 QRS Duration: 80 QT Interval:  400 QTC Calculation: 422 R Axis:   50 Text Interpretation:  Normal sinus rhythm Normal ECG since last tracing no significant change Confirmed by Eulis Foster  MD, ELLIOTT [phone]) on 10/17/2016 10:05:08 PM       Radiology Dg Chest 2 View  Result Date: 10/17/2016 CLINICAL DATA:  50 y/o F; chest pain with radiation to the right upper extremity EXAM: CHEST  2 VIEW COMPARISON:  10/03/2015 chest radiograph. FINDINGS: Stable cardiomediastinal silhouette within normal limits. Clear lungs. No pneumothorax or effusion. Right  upper quadrant surgical clips. No acute osseous abnormality is evident. IMPRESSION: No active cardiopulmonary disease. Electronically Signed   By: Kristine Garbe M.D.   On: 10/17/2016 20:56    Procedures Procedures (including critical care time)  Medications Ordered in ED Medications  ketorolac (TORADOL) 30 MG/ML injection 30 mg (30 mg Intravenous Given 10/18/16 0018)  metoCLOPramide (REGLAN) injection 10 mg (10 mg Intravenous Given 10/18/16 0018)     Initial Impression / Assessment and Plan / ED Course  I have reviewed the triage vital signs and the nursing notes.  Pertinent labs & imaging results that were available during my care of the patient were reviewed by me and considered in my medical decision making (see chart  for details).  Clinical Course   Presents with right arm pain and chest pain. Patient has been experiencing a sharp stabbing pain that seems to cluster around the wrist to radiate up and down the arm. She reports that the pain is a sharp stabbing and electric shock that comes and goes. She does not have classic findings of carpal tunnel, but is still possible. This is consistent with peripheral nerve impingement or neuropathy, seems to be separate from the chest pain. She has radial pulse palpable. She has normal sensation and strength in the arm. She has already been referred to neurology by her primary care provider, likely will need nerve conduction studies.  Chest pain was atypical. Heart score 1, (+1 only for age). EKG, troponin unremarkable. Appropriate for continued outpatient follow-up.  Final Clinical Impressions(s) / ED Diagnoses   Final diagnoses:  Chest pain, unspecified type  Neuropathy (HCC)    New Prescriptions New Prescriptions   GABAPENTIN (NEURONTIN) 300 MG CAPSULE    Take 1 capsule (300 mg total) by mouth 2 (two) times daily.   TRAMADOL (ULTRAM) 50 MG TABLET    Take 1 tablet (50 mg total) by mouth every 6 (six) hours as needed.   I  personally performed the services described in this documentation, which was scribed in my presence. The recorded information has been reviewed and is accurate.    Orpah Greek, MD 10/18/16 [place] [place]

## 2016-10-18 ENCOUNTER — Telehealth: Payer: Self-pay | Admitting: Nurse Practitioner

## 2016-10-18 DIAGNOSIS — Z79891 Long term (current) use of opiate analgesic: Secondary | ICD-10-CM | POA: Diagnosis not present

## 2016-10-18 DIAGNOSIS — R079 Chest pain, unspecified: Secondary | ICD-10-CM | POA: Diagnosis not present

## 2016-10-18 DIAGNOSIS — Z79899 Other long term (current) drug therapy: Secondary | ICD-10-CM | POA: Diagnosis not present

## 2016-10-18 DIAGNOSIS — G629 Polyneuropathy, unspecified: Secondary | ICD-10-CM | POA: Diagnosis not present

## 2016-10-18 DIAGNOSIS — R0602 Shortness of breath: Secondary | ICD-10-CM | POA: Diagnosis not present

## 2016-10-18 LAB — D-DIMER, QUANTITATIVE (NOT AT ARMC)

## 2016-10-18 MED ORDER — TRAMADOL HCL 50 MG PO TABS: 50.0000 mg | ORAL_TABLET | Freq: Four times a day (QID) | ORAL | 0 refills | Status: DC | PRN

## 2016-10-18 MED ORDER — GABAPENTIN 300 MG PO CAPS: 300.0000 mg | ORAL_CAPSULE | Freq: Two times a day (BID) | ORAL | 0 refills | Status: DC

## 2016-10-18 NOTE — ED Notes (Signed)
Pt states understanding of care given and follow up instructions.  Pt alert, oriented x4, steady gait.  Ambulated from ED with daughter

## 2016-10-18 NOTE — Telephone Encounter (Signed)
Patient wanted to let you know that she had another bad migraine on Sunday and she had to go to the ER last night and they think its a nerve causing this.

## 2016-10-19 NOTE — Telephone Encounter (Signed)
Left message on voicemail to return call.

## 2016-10-19 NOTE — Telephone Encounter (Signed)
I would not be surprised at all that a nerve in her neck is doing this. Does she have an appointment with a specialist?

## 2016-10-21 NOTE — Telephone Encounter (Signed)
Notified patient Nichole Brown would not be surprised at all that a nerve in her neck is doing this. Patient states that she has not heard from the specialist concerning her appointment. She is going to call their office and check on the status. She will call us back if she needs [place] further.

## 2016-11-01 ENCOUNTER — Encounter: Payer: Self-pay | Admitting: Nurse Practitioner

## 2016-11-07 ENCOUNTER — Telehealth: Payer: Self-pay | Admitting: Nurse Practitioner

## 2016-11-07 NOTE — Telephone Encounter (Signed)
Patient said she sent you a message via MyChart and she said that you had wanted to speak to her instead of e-mailing.  You can call her at her house or cell phone number.

## 2016-11-09 ENCOUNTER — Ambulatory Visit (INDEPENDENT_AMBULATORY_CARE_PROVIDER_SITE_OTHER): Payer: 59 | Admitting: Nurse Practitioner

## 2016-11-09 ENCOUNTER — Encounter: Payer: Self-pay | Admitting: Nurse Practitioner

## 2016-11-09 VITALS — BP 110/80 | Temp 98.4°F | Ht 62.0 in | Wt 165.4 lb

## 2016-11-09 DIAGNOSIS — F4321 Adjustment disorder with depressed mood: Secondary | ICD-10-CM | POA: Insufficient documentation

## 2016-11-09 DIAGNOSIS — Z634 Disappearance and death of family member: Secondary | ICD-10-CM | POA: Diagnosis not present

## 2016-11-09 DIAGNOSIS — J329 Chronic sinusitis, unspecified: Secondary | ICD-10-CM | POA: Diagnosis not present

## 2016-11-09 DIAGNOSIS — J310 Chronic rhinitis: Secondary | ICD-10-CM

## 2016-11-09 DIAGNOSIS — G43019 Migraine without aura, intractable, without status migrainosus: Secondary | ICD-10-CM

## 2016-11-09 MED ORDER — METHOCARBAMOL 500 MG PO TABS: 500.0000 mg | ORAL_TABLET | Freq: Three times a day (TID) | ORAL | 0 refills | Status: DC | PRN

## 2016-11-09 MED ORDER — AMOXICILLIN-POT CLAVULANATE 875-125 MG PO TABS: 1.0000 | ORAL_TABLET | Freq: Two times a day (BID) | ORAL | 0 refills | Status: DC

## 2016-11-09 MED ORDER — DULOXETINE HCL 60 MG PO CPEP: 60.0000 mg | ORAL_CAPSULE | Freq: Every day | ORAL | 1 refills | Status: DC

## 2016-11-09 MED ORDER — ALBUTEROL SULFATE HFA 108 (90 BASE) MCG/ACT IN AERS: 2.0000 | INHALATION_SPRAY | RESPIRATORY_TRACT | 0 refills | Status: DC | PRN

## 2016-11-09 NOTE — Telephone Encounter (Signed)
Discussed concerns at visit 11/09/16

## 2016-11-09 NOTE — Progress Notes (Signed)
Subjective:  Presents for complaints of sinus symptoms over the past 1-1/2 weeks. Head and chest congestion. No fever. Frequent cough producing mostly clear mucus. Chest pain with cough at times. Some wheezing. Sore throat. Ear pressure. Facial area pressure. Also has seen increase in her migraines having 4 or more over the past 2 months. Patient's son died a few months ago from overdose. Has an appointment with headache specialist, the earliest appointment was 2/29. Plans to restart mental health counseling. Has identified certain foods that trigger her migraines. Also seems be triggered by stress and weather.  Objective:   BP 110/80   Temp 98.4 F (36.9 C) (Oral)   Ht 5\' 2"  (1.575 m)   Wt 165 lb 6 oz (75 kg)   BMI 30.25 kg/m  NAD. Alert, oriented. Thoughts logical coherent and relevant. Dressed appropriately. Makes good eye contact. TMs clear effusion, no erythema. Pharynx mildly erythematous with PND noted. Neck supple with mild soft anterior adenopathy. Lungs clear. Heart regular rhythm. Extremely tight tender muscles noted along the upper back and neck area on the right side.  Assessment:  Problem List Items Addressed This Visit      Cardiovascular and Mediastinum   Migraine headache   Relevant Medications   methocarbamol (ROBAXIN) 500 MG tablet   DULoxetine (CYMBALTA) 60 MG capsule     Other   Grief at loss of child    Other Visit Diagnoses    Rhinosinusitis    -  Primary   Relevant Medications   amoxicillin-clavulanate (AUGMENTIN) 875-125 MG tablet     Plan:  Meds ordered this encounter  Medications  . methocarbamol (ROBAXIN) 500 MG tablet    Sig: Take 1 tablet (500 mg total) by mouth every 8 (eight) hours as needed for muscle spasms.    Dispense:  30 tablet    Refill:  0    Order Specific Question:   Supervising Provider    Answer:   Mikey Kirschner [[id number]]  . DULoxetine (CYMBALTA) 60 MG capsule    Sig: Take 1 capsule (60 mg total) by mouth daily.    Dispense:  90  capsule    Refill:  1    Order Specific Question:   Supervising Provider    Answer:   Mikey Kirschner [[id number]]  . amoxicillin-clavulanate (AUGMENTIN) 875-125 MG tablet    Sig: Take 1 tablet by mouth 2 (two) times daily.    Dispense:  20 tablet    Refill:  0    Order Specific Question:   Supervising Provider    Answer:   Mikey Kirschner [[id number]]  . albuterol (PROVENTIL HFA;VENTOLIN HFA) 108 (90 Base) MCG/ACT inhaler    Sig: Inhale 2 puffs into the lungs every 4 (four) hours as needed.    Dispense:  1 Inhaler    Refill:  0    Order Specific Question:   Supervising Provider    Answer:   Mikey Kirschner [[id number]]   OTC meds as directed for congestion and cough. Albuterol inhaler as directed when necessary wheezing. Increase Cymbalta to 60 mg daily. Strongly encourage mental health counseling. Refill Robaxin which seems to help neck pain and headaches. Warning signs reviewed regarding headaches, patient to call back in a couple weeks if no improvement, sooner if worse. Hold on referral to another specialist at this time per patient request.

## 2016-11-21 ENCOUNTER — Ambulatory Visit: Payer: 59 | Admitting: Nurse Practitioner

## 2016-11-28 ENCOUNTER — Telehealth: Payer: Self-pay | Admitting: Family Medicine

## 2016-11-28 MED ORDER — CLARITHROMYCIN 500 MG PO TABS: 500.0000 mg | ORAL_TABLET | Freq: Two times a day (BID) | ORAL | 0 refills | Status: DC

## 2016-11-28 MED ORDER — BENZONATATE 100 MG PO CAPS: 100.0000 mg | ORAL_CAPSULE | Freq: Four times a day (QID) | ORAL | 0 refills | Status: DC | PRN

## 2016-11-28 NOTE — Telephone Encounter (Signed)
biaxin 500 bid for ten d tess perles 100 mg 28 one q six hrs prn cough

## 2016-11-28 NOTE — Telephone Encounter (Signed)
Prescriptions sent electronically to pharmacy. Patient notified. °

## 2016-11-28 NOTE — Telephone Encounter (Signed)
Pt called stating that she has finished the antibiotics that [facility] prescribed her and now has a horrible cough. Pt states that she is not coughing anything up. Can something be called in. Please advise.    [place]

## 2016-11-28 NOTE — Telephone Encounter (Signed)
Patient was prescribed augmenting 11/09/16 for sinus infection

## 2016-12-26 ENCOUNTER — Ambulatory Visit (INDEPENDENT_AMBULATORY_CARE_PROVIDER_SITE_OTHER): Payer: 59 | Admitting: Nurse Practitioner

## 2016-12-26 ENCOUNTER — Encounter: Payer: Self-pay | Admitting: Nurse Practitioner

## 2016-12-26 VITALS — BP 116/76 | Temp 98.5°F | Ht 62.0 in | Wt 166.5 lb

## 2016-12-26 DIAGNOSIS — J300 Vasomotor rhinitis: Secondary | ICD-10-CM

## 2016-12-26 DIAGNOSIS — K219 Gastro-esophageal reflux disease without esophagitis: Secondary | ICD-10-CM | POA: Diagnosis not present

## 2016-12-26 DIAGNOSIS — K2900 Acute gastritis without bleeding: Secondary | ICD-10-CM

## 2016-12-26 MED ORDER — PANTOPRAZOLE SODIUM 40 MG PO TBEC: 40.0000 mg | DELAYED_RELEASE_TABLET | Freq: Every day | ORAL | 0 refills | Status: DC

## 2016-12-26 MED ORDER — DULOXETINE HCL 30 MG PO CPEP: 30.0000 mg | ORAL_CAPSULE | Freq: Every day | ORAL | 1 refills | Status: DC

## 2016-12-26 NOTE — Progress Notes (Signed)
Subjective:  Presents for complaints of continued cough. Had an illness on 12/29-30 that seemed to trigger her symptoms again. Has had increased dizziness since increasing dose of Cymbalta to 60 mg. Is scheduled to see a headache specialist on 2/27. Off-and-on slight sore throat. Mostly nonproductive cough. Having acid reflux about twice per week, had fairly intense reflux yesterday. No abdominal pain. No vomiting or diarrhea. Has some regurgitation at times. Minimal caffeine intake. Denies any alcohol or tobacco use. No excessive NSAID use.  Objective:   BP 116/76   Temp 98.5 F (36.9 C) (Oral)   Ht 5\' 2"  (1.575 m)   Wt 166 lb 8 oz (75.5 kg)   BMI 30.45 kg/m  NAD. Alert, oriented. TMs retracted bilateral, no erythema. Pharynx nonerythematous with cloudy PND noted. Neck supple with mild soft anterior adenopathy. Lungs clear. Heart regular rate rhythm. Abdomen soft nondistended with distinct epigastric area tenderness. Mild guarding. No rebound. No obvious masses. Normal chest x-ray 10/17/2016.  Assessment:   Problem List Items Addressed This Visit      Digestive   Gastroesophageal reflux disease without esophagitis   Relevant Medications   pantoprazole (PROTONIX) 40 MG tablet    Other Visit Diagnoses    Chronic vasomotor rhinitis    -  Primary   Acute gastritis without hemorrhage, unspecified gastritis type           Plan:  Meds ordered this encounter  Medications  . DULoxetine (CYMBALTA) 30 MG capsule    Sig: Take 1 capsule (30 mg total) by mouth daily.    Dispense:  90 capsule    Refill:  1    Order Specific Question:   Supervising Provider    Answer:   Mikey Kirschner [[id number]]  . pantoprazole (PROTONIX) 40 MG tablet    Sig: Take 1 tablet (40 mg total) by mouth daily. For acid reflux    Dispense:  90 tablet    Refill:  0    Order Specific Question:   Supervising Provider    Answer:   Mikey Kirschner [[id number]]   Reflux symptoms and sinus drainage most likely contributing to  persistent cough and recurrent sore throat. Given written and verbal information on reflux disease. Take Protonix 4-6 weeks. Callback in 2-3 weeks if no improvement in abdominal pain or reflux symptoms. Reduce Cymbalta to 30 mg daily. Avoid excessive use of anti-inflammatory medications. Follow-up with headache specialist as planned, call back sooner if needed.

## 2016-12-26 NOTE — Patient Instructions (Signed)
Food Choices for Gastroesophageal Reflux Disease, Adult When you have gastroesophageal reflux disease (GERD), the foods you eat and your eating habits are very important. Choosing the right foods can help ease the discomfort of GERD. What general guidelines do I need to follow?  Choose fruits, vegetables, whole grains, low-fat dairy products, and low-fat meat, fish, and poultry.  Limit fats such as oils, salad dressings, butter, nuts, and avocado.  Keep a food diary to identify foods that cause symptoms.  Avoid foods that cause reflux. These may be different for different people.  Eat frequent small meals instead of three large meals each day.  Eat your meals slowly, in a relaxed setting.  Limit fried foods.  Cook foods using methods other than frying.  Avoid drinking alcohol.  Avoid drinking large amounts of liquids with your meals.  Avoid bending over or lying down until 2-3 hours after eating. What foods are not recommended? The following are some foods and drinks that may worsen your symptoms: Vegetables  Tomatoes. Tomato juice. Tomato and spaghetti sauce. Chili peppers. Onion and garlic. Horseradish. Fruits  Oranges, grapefruit, and lemon (fruit and juice). Meats  High-fat meats, fish, and poultry. This includes hot dogs, ribs, ham, sausage, salami, and bacon. Dairy  Whole milk and chocolate milk. Sour cream. Cream. Butter. Ice cream. Cream cheese. Beverages  Coffee and tea, with or without caffeine. Carbonated beverages or energy drinks. Condiments  Hot sauce. Barbecue sauce. Sweets/Desserts  Chocolate and cocoa. Donuts. Peppermint and spearmint. Fats and Oils  High-fat foods, including [place] fries and potato chips. Other  Vinegar. Strong spices, such as black pepper, white pepper, red pepper, cayenne, curry powder, cloves, ginger, and chili powder. The items listed above may not be a complete list of foods and beverages to avoid. Contact your dietitian for more  information.  This information is not intended to replace advice given to you by your health care provider. Make sure you discuss any questions you have with your health care provider. Document Released: 11/28/2005 Document Revised: 05/05/2016 Document Reviewed: 10/02/2013 Elsevier Interactive Patient Education  2017 [employer].

## 2017-01-18 ENCOUNTER — Encounter: Payer: Self-pay | Admitting: Family Medicine

## 2017-01-18 ENCOUNTER — Ambulatory Visit (INDEPENDENT_AMBULATORY_CARE_PROVIDER_SITE_OTHER): Payer: 59 | Admitting: Family Medicine

## 2017-01-18 VITALS — BP 112/72 | Ht 62.0 in | Wt 167.0 lb

## 2017-01-18 DIAGNOSIS — G43009 Migraine without aura, not intractable, without status migrainosus: Secondary | ICD-10-CM | POA: Diagnosis not present

## 2017-01-18 MED ORDER — NAPROXEN 500 MG PO TABS: 500.0000 mg | ORAL_TABLET | Freq: Two times a day (BID) | ORAL | 0 refills | Status: DC

## 2017-01-18 MED ORDER — ONDANSETRON 4 MG PO TBDP: 4.0000 mg | ORAL_TABLET | Freq: Three times a day (TID) | ORAL | 0 refills | Status: DC | PRN

## 2017-01-18 NOTE — Progress Notes (Signed)
   Subjective:    Patient ID: Nichole Brown, female    DOB: 1966-12-03, 51 y.o.   MRN: [id number]  HPI  Patient arrives with c/o migraine yesterday. Patient states her FMLA papers for her migraines ran out in January and she needs then redone  throbing and nausea and dsicomfor Pt backed off recently on the cymbala due to secondary dizzine   Hit fairly hard  Takes prn tylenol but usually just lays down and sleep  Pt experiences couple times per month with the migraines Review of Systems No headache, no major weight loss or weight gain, no chest pain no back pain abdominal pain no change in bowel habits complete ROS otherwise negative     Objective:   Physical Exam Alert vitals stable, NAD. Blood pressure good on repeat. HEENT normal. Lungs clear. Heart regular rate and rhythm. Neuro exam intact       Assessment & Plan:  Impression migraine headache. Patient experiencing 2 or 3 months now. Has been more frequently lately. Did suffer a terrible loss this past year with loss of son. Due to see specialists soon plan Naprosyn and Zofran when necessary at first sign of headache. This is prescribed symptom care discussed we will do FMLA when available

## 2017-01-23 DIAGNOSIS — Z029 Encounter for administrative examinations, unspecified: Secondary | ICD-10-CM

## 2017-01-25 ENCOUNTER — Telehealth: Payer: Self-pay | Admitting: *Deleted

## 2017-01-25 ENCOUNTER — Telehealth: Payer: Self-pay | Admitting: Nurse Practitioner

## 2017-01-25 NOTE — Telephone Encounter (Signed)
Pt advised to go to ED

## 2017-01-25 NOTE — Telephone Encounter (Signed)
Patient woke up this morning and was feeling great and went to work.  When she came home, she went to [place] with her daughter and she wasn't driving.  All of a sudden she got really dizzy headed, heart racing, nausea, shakey and headache.  She said she can't catch her breath and she is concerned that it came on so sudden.  She said this started around 2 pm today.  She said she knows it will become a migraine in the next hour.

## 2017-01-25 NOTE — Telephone Encounter (Signed)
Pt called states she is having dizziness, headache, nausea, shaky. Started about one and a half hours ago. Pt states she usually has these symptoms before a migraine but not as intense. Pt advised to go to ED for treatment.

## 2017-02-03 ENCOUNTER — Ambulatory Visit: Payer: Self-pay | Admitting: Physician Assistant

## 2017-02-07 DIAGNOSIS — G43709 Chronic migraine without aura, not intractable, without status migrainosus: Secondary | ICD-10-CM | POA: Diagnosis not present

## 2017-02-09 ENCOUNTER — Ambulatory Visit (INDEPENDENT_AMBULATORY_CARE_PROVIDER_SITE_OTHER): Payer: 59 | Admitting: Nurse Practitioner

## 2017-02-09 ENCOUNTER — Encounter: Payer: Self-pay | Admitting: Nurse Practitioner

## 2017-02-09 VITALS — BP 124/80 | Ht 62.0 in | Wt 163.1 lb

## 2017-02-09 DIAGNOSIS — M797 Fibromyalgia: Secondary | ICD-10-CM | POA: Diagnosis not present

## 2017-02-09 DIAGNOSIS — G43019 Migraine without aura, intractable, without status migrainosus: Secondary | ICD-10-CM | POA: Diagnosis not present

## 2017-02-09 NOTE — Progress Notes (Signed)
N

## 2017-02-10 ENCOUNTER — Encounter: Payer: Self-pay | Admitting: Nurse Practitioner

## 2017-02-10 NOTE — Progress Notes (Signed)
Subjective:  Presents to discuss her migraines. Recently began seeing a neurologist at a [facility] outpatient [facility]. Was prescribed nortriptyline 25 mg at nighttime to help sleep, patient took 2 doses and experienced extreme nausea and actually less sleep. Patient took medication back in 2016 but no adverse effects were noted, patient requested switching to Cymbalta. Has stopped Cymbalta due to hallucinations and sleep walking. Will be starting a prednisone taper given to her by her specialist and wants to hold on any other medication changes until this is complete. Mainly would like to list of medications that have been tried for her migraines that had adverse effects. No migraines became more severe after the death of her son.  Objective:   BP 124/80   Ht 5\' 2"  (1.575 m)   Wt 163 lb 2 oz (74 kg)   BMI 29.84 kg/m  NAD. Alert, oriented. Calm affect. Lungs clear. Heart regular rate rhythm.  Assessment:   Problem List Items Addressed This Visit      Cardiovascular and Mediastinum   Migraine headache - Primary     Other   Fibromyalgia       Plan:  Information regarding her previous migraine medicines and adverse reactions were entered in her chart and a copy given to patient. Continue follow-up with neurologist as planned. Hold on nortriptyline due to adverse effects. Follow-up here as needed.

## 2017-02-15 DIAGNOSIS — R42 Dizziness and giddiness: Secondary | ICD-10-CM | POA: Diagnosis not present

## 2017-02-23 DIAGNOSIS — R42 Dizziness and giddiness: Secondary | ICD-10-CM | POA: Diagnosis not present

## 2017-02-27 DIAGNOSIS — R42 Dizziness and giddiness: Secondary | ICD-10-CM | POA: Diagnosis not present

## 2017-02-27 DIAGNOSIS — M542 Cervicalgia: Secondary | ICD-10-CM | POA: Diagnosis not present

## 2017-03-07 DIAGNOSIS — R42 Dizziness and giddiness: Secondary | ICD-10-CM | POA: Diagnosis not present

## 2017-03-07 DIAGNOSIS — M542 Cervicalgia: Secondary | ICD-10-CM | POA: Diagnosis not present

## 2017-03-08 DIAGNOSIS — R42 Dizziness and giddiness: Secondary | ICD-10-CM | POA: Diagnosis not present

## 2017-03-09 ENCOUNTER — Ambulatory Visit (INDEPENDENT_AMBULATORY_CARE_PROVIDER_SITE_OTHER): Payer: 59 | Admitting: Cardiovascular Disease

## 2017-03-09 ENCOUNTER — Encounter: Payer: Self-pay | Admitting: Cardiovascular Disease

## 2017-03-09 VITALS — BP 106/68 | HR 80 | Ht 63.0 in | Wt 169.5 lb

## 2017-03-09 DIAGNOSIS — R0600 Dyspnea, unspecified: Secondary | ICD-10-CM | POA: Diagnosis not present

## 2017-03-09 DIAGNOSIS — R079 Chest pain, unspecified: Secondary | ICD-10-CM

## 2017-03-09 DIAGNOSIS — R42 Dizziness and giddiness: Secondary | ICD-10-CM

## 2017-03-09 NOTE — Patient Instructions (Addendum)
Medication Instructions:  Your physician recommends that you continue on your current medications as directed. Please refer to the Current Medication list given to you today.   Labwork: none  Testing/Procedures: Your physician has requested that you have an echocardiogram. Echocardiography is a painless test that uses sound waves to create images of your heart. It provides your doctor with information about the size and shape of your heart and how well your heart's chambers and valves are working. This procedure takes approximately one hour. There are no restrictions for this procedure.  Your physician has requested that you have an exercise tolerance test. For further information please visit [email or web address]. Please also follow instruction sheet, as given.    Follow-Up: Your physician recommends that you schedule a follow-up appointment as needed with Dr. Fletcher Anon.    Any Other Special Instructions Will Be Listed Below (If Applicable).     If you need a refill on your cardiac medications before your next appointment, please call your pharmacy.  Echocardiogram An echocardiogram, or echocardiography, uses sound waves (ultrasound) to produce an image of your heart. The echocardiogram is simple, painless, obtained within a short period of time, and offers valuable information to your health care provider. The images from an echocardiogram can provide information such as:  Evidence of coronary artery disease (CAD).  Heart size.  Heart muscle function.  Heart valve function.  Aneurysm detection.  Evidence of a past heart attack.  Fluid buildup around the heart.  Heart muscle thickening.  Assess heart valve function. Tell a health care provider about:  Any allergies you have.  All medicines you are taking, including vitamins, herbs, eye drops, creams, and over-the-counter medicines.  Any problems you or family members have had with anesthetic medicines.  Any blood  disorders you have.  Any surgeries you have had.  Any medical conditions you have.  Whether you are pregnant or may be pregnant. What happens before the procedure? No special preparation is needed. Eat and drink normally. What happens during the procedure?  In order to produce an image of your heart, gel will be applied to your chest and a wand-like tool (transducer) will be moved over your chest. The gel will help transmit the sound waves from the transducer. The sound waves will harmlessly bounce off your heart to allow the heart images to be captured in real-time motion. These images will then be recorded.  You may need an IV to receive a medicine that improves the quality of the pictures. What happens after the procedure? You may return to your normal schedule including diet, activities, and medicines, unless your health care provider tells you otherwise. This information is not intended to replace advice given to you by your health care provider. Make sure you discuss any questions you have with your health care provider. Document Released: 11/25/2000 Document Revised: 07/16/2016 Document Reviewed: 08/05/2013 Elsevier Interactive Patient Education  2017 [employer].  Exercise Stress Electrocardiogram An exercise stress electrocardiogram is a test to check how blood flows to your heart. It is done to find areas of poor blood flow. You will need to walk on a treadmill for this test. The electrocardiogram will record your heartbeat when you are at rest and when you are exercising. What happens before the procedure?  Do not have drinks with caffeine or foods with caffeine for 24 hours before the test, or as told by your doctor. This includes coffee, tea (even decaf tea), sodas, chocolate, and cocoa.  Follow your doctor's instructions about  eating and drinking before the test.  Ask your doctor what medicines you should or should not take before the test. Take your medicines with water  unless told by your doctor not to.  If you use an inhaler, bring it with you to the test.  Do not  smoke for 4 hours before the test.  Do not put lotions, powders, creams, or oils on your chest before the test.  Wear comfortable shoes and clothing. What happens during the procedure?  You will have patches put on your chest. Small areas of your chest may need to be shaved. Wires will be connected to the patches.  Your heart rate will be watched while you are resting and while you are exercising.  You will walk on the treadmill. The treadmill will slowly get faster to raise your heart rate.  What happens after the procedure?  Your heart rate and blood pressure will be watched after the test.  You may return to your normal diet, activities, and medicines or as told by your doctor. This information is not intended to replace advice given to you by your health care provider. Make sure you discuss any questions you have with your health care provider. Document Released: 05/16/2008 Document Revised: 07/27/2016 Document Reviewed: 08/05/2013 Elsevier Interactive Patient Education  2017 [employer].

## 2017-03-09 NOTE — Progress Notes (Signed)
Cardiology Office Note   Date:  03/09/2017   ID:  Nichole Brown, DOB 1966/02/01, MRN [id number]  PCP:  Mickie Hillier, MD  Cardiologist:   Kathlyn Sacramento, MD   Chief Complaint  Patient presents with  . other    Self ref for h/o of PFO as a child with having open heart surgery at age 51 yrs. old.  Pt. c/o dizziness, syncope over a period of years and anxiety.       History of Present Illness: Nichole Brown is a 51 y.o. female who presents for Evaluation of dizziness, shortness of breath and chest pain. She had patent ductus arteriosus surgical repair in 1967 when she was 51 years old. No cardiac events since then. She has no significant chronic medical conditions other than migraine, anxiety and depression. She works at the lab at [employer]. She is not a smoker and drinks alcohol socially. No family history of premature coronary artery disease. She describes prolonged history of chronic dizziness described as lightheadedness especially with standing up. She had syncope in the past most recently 6 months ago. Her syncopal episodes usually happen in the setting of intense migraine. She describes exertional dyspnea with some worsening recently. She reports episodes of substernal chest tightness that happens mostly when she is under stress and not with physical activities. She exercises 2-3 times per week.   Past Medical History:  Diagnosis Date  . Anxiety   . Depression   . Fibromyalgia 2005  . Insomnia   . Migraine headache     Past Surgical History:  Procedure Laterality Date  . ABDOMINAL HYSTERECTOMY    . CESAREAN SECTION    . CHOLECYSTECTOMY  1988  . open heart surgery  1967  . PATENT DUCTUS ARTERIOUS REPAIR  1967  . SHOULDER SURGERY Left 2008   [facility] joint     Current Outpatient Prescriptions  Medication Sig Dispense Refill  . hydroxypropyl methylcellulose / hypromellose (ISOPTO TEARS / GONIOVISC) 2.5 % ophthalmic solution Place 1 drop into both eyes daily.    . naproxen  (NAPROSYN) 500 MG tablet Take 1 tablet (500 mg total) by mouth 2 (two) times daily with a meal. As needed for headache 24 tablet 0  . ondansetron (ZOFRAN ODT) 4 MG disintegrating tablet Take 1 tablet (4 mg total) by mouth every 8 (eight) hours as needed for nausea or vomiting. 16 tablet 0  . pantoprazole (PROTONIX) 40 MG tablet Take 1 tablet (40 mg total) by mouth daily. For acid reflux 90 tablet 0   No current facility-administered medications for this visit.     Allergies:   Cymbalta [duloxetine hcl]; Nortriptyline; and Topamax [topiramate]    Social History:  The patient  reports that she has never smoked. She has never used smokeless tobacco. She reports that she drinks alcohol. She reports that she does not use drugs.   Family History:  The patient's family history includes Breast cancer (age of onset: 62) in her mother; Diabetes in her maternal grandmother; Heart disease (age of onset: 20) in her maternal grandmother; Hypertension in her father, maternal grandfather, and maternal grandmother; Throat cancer in her maternal grandfather.    ROS:  Please see the history of present illness.   Otherwise, review of systems are positive for none.   All other systems are reviewed and negative.    PHYSICAL EXAM: VS:  BP 106/68 (BP Location: Right Arm, Patient Position: Sitting, Cuff Size: Normal)   Pulse 80   Ht 5\' 3"  (  1.6 m)   Wt 169 lb 8 oz (76.9 kg)   BMI 30.03 kg/m  , BMI Body mass index is 30.03 kg/m. GEN: Well nourished, well developed, in no acute distress  HEENT: normal  Neck: no JVD, carotid bruits, or masses Cardiac: RRR; no murmurs, rubs, or gallops,no edema  Respiratory:  clear to auscultation bilaterally, normal work of breathing GI: soft, nontender, nondistended, + BS MS: no deformity or atrophy  Skin: warm and dry, no rash Neuro:  Strength and sensation are intact Psych: euthymic mood, full affect   EKG:  EKG is ordered today. The ekg ordered today demonstrates   normal sinus rhythm with no significant ST or T wave changes.   Recent Labs: 04/29/2016: ALT 23 10/07/2016: TSH 1.980 10/17/2016: BUN 16; Creatinine, Ser 0.70; Hemoglobin 13.0; Platelets 247; Potassium 3.7; Sodium 139    Lipid Panel    Component Value Date/Time   CHOL 169 04/29/2016 0600   CHOL 176 09/22/2014 0615   TRIG 120 04/29/2016 0600   TRIG 242 (H) 09/22/2014 0615   HDL 47 04/29/2016 0600   HDL 46 09/22/2014 0615   CHOLHDL 3.6 04/29/2016 0600   VLDL 24 04/29/2016 0600   VLDL 48 (H) 09/22/2014 0615   LDLCALC 98 04/29/2016 0600   LDLCALC 82 09/22/2014 0615      Wt Readings from Last 3 Encounters:  03/09/17 169 lb 8 oz (76.9 kg)  02/09/17 163 lb 2 oz (74 kg)  01/18/17 167 lb (75.8 kg)        PAD Screen 03/09/2017  Previous PAD dx? No  Previous surgical procedure? No  Pain with walking? No  Feet/toe relief with dangling? No  Painful, non-healing ulcers? No  Extremities discolored? No      ASSESSMENT AND PLAN:  1.  Dizziness: Not associated with palpitations and there is nothing to suggest arrhythmia. Some of her symptoms seem to be related to orthostatic hypotension. I advised her to stay well-hydrated and avoid sudden change in position.  2. Exertional dyspnea with atypical chest pain: Her cardiac exam is unremarkable and baseline ECG is normal. I requested an echocardiogram and treadmill stress test for evaluation.    Disposition:   FU with me as needed.  Signed,  Kathlyn Sacramento, MD  03/09/2017 4:23 PM    [place]

## 2017-03-20 ENCOUNTER — Ambulatory Visit: Payer: 59 | Admitting: Nurse Practitioner

## 2017-03-21 DIAGNOSIS — R42 Dizziness and giddiness: Secondary | ICD-10-CM | POA: Diagnosis not present

## 2017-03-21 DIAGNOSIS — M542 Cervicalgia: Secondary | ICD-10-CM | POA: Diagnosis not present

## 2017-03-28 ENCOUNTER — Ambulatory Visit (INDEPENDENT_AMBULATORY_CARE_PROVIDER_SITE_OTHER): Payer: 59

## 2017-03-28 DIAGNOSIS — R079 Chest pain, unspecified: Secondary | ICD-10-CM | POA: Diagnosis not present

## 2017-04-01 LAB — EXERCISE TOLERANCE TEST
CSEPHR: 92 %
CSEPPHR: 155 {beats}/min
Estimated workload: 10.4 METS
Exercise duration (min): 8 min
Exercise duration (sec): 52 s
MPHR: 167 {beats}/min
Rest HR: 91 {beats}/min

## 2017-04-17 ENCOUNTER — Encounter: Payer: Self-pay | Admitting: Family Medicine

## 2017-04-17 ENCOUNTER — Ambulatory Visit (INDEPENDENT_AMBULATORY_CARE_PROVIDER_SITE_OTHER): Payer: 59 | Admitting: Family Medicine

## 2017-04-17 VITALS — BP 100/68 | Temp 99.6°F | Ht 62.0 in | Wt 168.2 lb

## 2017-04-17 DIAGNOSIS — J111 Influenza due to unidentified influenza virus with other respiratory manifestations: Secondary | ICD-10-CM | POA: Diagnosis not present

## 2017-04-17 MED ORDER — OSELTAMIVIR PHOSPHATE 75 MG PO CAPS: 75.0000 mg | ORAL_CAPSULE | Freq: Two times a day (BID) | ORAL | 0 refills | Status: DC

## 2017-04-17 NOTE — Progress Notes (Signed)
   Subjective:    Patient ID: Nichole Brown, female    DOB: 21-Jul-1966, 51 y.o.   MRN: [id number]  Influenza  This is a new problem. The current episode started yesterday. Associated symptoms include congestion, coughing, a fever, headaches and myalgias. Pertinent negatives include no chest pain. Associated symptoms comments: Runny Nose . Treatments tried: Claritin D, Alka Seltzer, Tylenol Severe Sinus, Tylenol, Benadryl    Patient states no other concerns this visit.  Patient states she was doing well until Sunday started having some symptoms by Sunday evening having severe symptoms.  Review of Systems  Constitutional: Positive for fever. Negative for activity change.  HENT: Positive for congestion and rhinorrhea. Negative for ear pain.   Eyes: Negative for discharge.  Respiratory: Positive for cough. Negative for shortness of breath and wheezing.   Cardiovascular: Negative for chest pain.  Musculoskeletal: Positive for myalgias.  Neurological: Positive for headaches.       Objective:   Physical Exam  Constitutional: She appears well-developed.  HENT:  Head: Normocephalic.  Nose: Nose normal.  Mouth/Throat: Oropharynx is clear and moist. No oropharyngeal exudate.  Neck: Neck supple.  Cardiovascular: Normal rate and normal heart sounds.   No murmur heard. Pulmonary/Chest: Effort normal and breath sounds normal. She has no wheezes.  Lymphadenopathy:    She has no cervical adenopathy.  Skin: Skin is warm and dry.  Nursing note and vitals reviewed.         Assessment & Plan:  Influenza-the patient was diagnosed with influenza. Patient/family educated about the flu and warning signs to watch for. If difficulty breathing, severe neck pain and stiffness, cyanosis, disorientation, or progressive worsening then immediately get rechecked at that ER. If progressive symptoms be certain to be rechecked. Supportive measures such as Tylenol/ibuprofen was discussed. No aspirin use in  children. And influenza home care instruction sheet was given. Patient not toxic no pneumonia detected no antibiotic indicated Tamiflu prescribed warning signs discussed in detail

## 2017-04-17 NOTE — Patient Instructions (Signed)

## 2017-04-21 ENCOUNTER — Other Ambulatory Visit: Payer: Self-pay

## 2017-04-21 ENCOUNTER — Telehealth: Payer: Self-pay | Admitting: Family Medicine

## 2017-04-21 NOTE — Telephone Encounter (Signed)
Discussed with pt. Pt states she will go to urgent care today

## 2017-04-21 NOTE — Telephone Encounter (Signed)
bcz of SOB and fever she may need xray and she NTBS- I rec ov at urgent care today

## 2017-04-21 NOTE — Telephone Encounter (Signed)
Started feeling better but worse today. Cough, congestion yellow,  shortness of breath, no fever. Taking tylenol. Will finish tamiflu tomorrow.

## 2017-04-21 NOTE — Telephone Encounter (Signed)
Patient was seen on 5/7 and had the flu but she feels worst today. She states chest is hurting,no fever, stuff is coming up,still has bad cough.Taking tamiflu,taking  cough medication.Can she start her clartin -D when she finshes her antibiotic tomorrow.

## 2017-04-28 ENCOUNTER — Ambulatory Visit (INDEPENDENT_AMBULATORY_CARE_PROVIDER_SITE_OTHER): Payer: 59 | Admitting: Nurse Practitioner

## 2017-04-28 ENCOUNTER — Encounter: Payer: Self-pay | Admitting: Nurse Practitioner

## 2017-04-28 VITALS — BP 106/74 | Temp 98.9°F | Wt 169.1 lb

## 2017-04-28 DIAGNOSIS — J209 Acute bronchitis, unspecified: Secondary | ICD-10-CM

## 2017-04-28 DIAGNOSIS — J0110 Acute frontal sinusitis, unspecified: Secondary | ICD-10-CM | POA: Diagnosis not present

## 2017-04-28 MED ORDER — AMOXICILLIN-POT CLAVULANATE 875-125 MG PO TABS
1.0000 | ORAL_TABLET | Freq: Two times a day (BID) | ORAL | 0 refills | Status: DC
Start: 2017-04-28 — End: 2017-06-16

## 2017-04-28 MED ORDER — BENZONATATE 100 MG PO CAPS: 100.0000 mg | ORAL_CAPSULE | Freq: Three times a day (TID) | ORAL | 0 refills | Status: DC | PRN

## 2017-04-28 MED ORDER — ALBUTEROL SULFATE HFA 108 (90 BASE) MCG/ACT IN AERS: 2.0000 | INHALATION_SPRAY | RESPIRATORY_TRACT | 0 refills | Status: DC | PRN

## 2017-04-28 MED ORDER — NAPROXEN 500 MG PO TABS: 500.0000 mg | ORAL_TABLET | Freq: Two times a day (BID) | ORAL | 0 refills | Status: DC

## 2017-04-28 MED ORDER — GABAPENTIN 100 MG PO CAPS: 100.0000 mg | ORAL_CAPSULE | Freq: Two times a day (BID) | ORAL | 2 refills | Status: DC

## 2017-04-29 ENCOUNTER — Encounter: Payer: Self-pay | Admitting: Nurse Practitioner

## 2017-04-29 NOTE — Progress Notes (Signed)
Subjective:  Presents for c/o persistent cough since having influenza on 04/17/17. No fever. Sore throat. Frontal area headache. Runny nose. Frequent "bad" cough. Head congestion. Producing green sputum. No wheezing. Slight chest tightness. No V/D or abd pain.   Objective:   BP 106/74   Temp 98.9 F (37.2 C) (Oral)   Wt 169 lb 2 oz (76.7 kg)   BMI 30.93 kg/m  NAD. Alert, oriented. TMs very retracted, no erythema. Pharynx injected with PND noted. Neck supple with mild anterior adenopathy. Lungs one faint expiratory wheeze left upper lobe only. Scattered faint expiratory crackles with occasional bronchitic cough. Heart RRR.   Assessment:  Acute non-recurrent frontal sinusitis  Acute bronchitis, unspecified organism    Plan:   Meds ordered this encounter  Medications  . albuterol (PROVENTIL HFA;VENTOLIN HFA) 108 (90 Base) MCG/ACT inhaler    Sig: Inhale 2 puffs into the lungs every 4 (four) hours as needed.    Dispense:  1 Inhaler    Refill:  0    Order Specific Question:   Supervising Provider    Answer:   Mikey Kirschner [[id number]]  . amoxicillin-clavulanate (AUGMENTIN) 875-125 MG tablet    Sig: Take 1 tablet by mouth 2 (two) times daily.    Dispense:  20 tablet    Refill:  0    Order Specific Question:   Supervising Provider    Answer:   Mikey Kirschner [[id number]]  . naproxen (NAPROSYN) 500 MG tablet    Sig: Take 1 tablet (500 mg total) by mouth 2 (two) times daily with a meal. As needed for headache    Dispense:  30 tablet    Refill:  0    Order Specific Question:   Supervising Provider    Answer:   Mikey Kirschner [[id number]]  . benzonatate (TESSALON) 100 MG capsule    Sig: Take 1 capsule (100 mg total) by mouth 3 (three) times daily as needed for cough.    Dispense:  30 capsule    Refill:  0    Order Specific Question:   Supervising Provider    Answer:   Mikey Kirschner [[id number]]  . gabapentin (NEURONTIN) 100 MG capsule    Sig: Take 1 capsule (100 mg total) by mouth 2 (two) times  daily.    Dispense:  60 capsule    Refill:  2    Order Specific Question:   Supervising Provider    Answer:   Mikey Kirschner [[id number]]    call back next week if no improvement, sooner if worse. Was given Gabapentin by another provider which helped her fibromyalgia but was only limited Rx. Restart low dose; may need to titrate. Recommend follow up.

## 2017-05-17 ENCOUNTER — Encounter: Payer: Self-pay | Admitting: Family Medicine

## 2017-05-23 ENCOUNTER — Ambulatory Visit (INDEPENDENT_AMBULATORY_CARE_PROVIDER_SITE_OTHER): Payer: 59 | Admitting: Orthopedic Surgery

## 2017-05-23 ENCOUNTER — Encounter (INDEPENDENT_AMBULATORY_CARE_PROVIDER_SITE_OTHER): Payer: Self-pay | Admitting: Orthopedic Surgery

## 2017-05-23 ENCOUNTER — Ambulatory Visit (INDEPENDENT_AMBULATORY_CARE_PROVIDER_SITE_OTHER): Payer: 59

## 2017-05-23 VITALS — Ht 62.0 in | Wt 169.0 lb

## 2017-05-23 DIAGNOSIS — M25512 Pain in left shoulder: Secondary | ICD-10-CM

## 2017-05-23 DIAGNOSIS — G8929 Other chronic pain: Secondary | ICD-10-CM | POA: Diagnosis not present

## 2017-05-23 DIAGNOSIS — M7542 Impingement syndrome of left shoulder: Secondary | ICD-10-CM

## 2017-05-23 NOTE — Progress Notes (Signed)
Office Visit Note   Patient: Nichole Brown           Date of Birth: Jun 20, 1966           MRN: [id number] Visit Date: 05/23/2017              Requested by: Mikey Kirschner, [address] [place], [place] [address] PCP: Mikey Kirschner, MD  Chief Complaint  Patient presents with  . Left Shoulder - Pain    Left Shoulder Arthroscopic debridement 02/2009      HPI: Patient is a 51 year old woman who presents with left shoulder pain for several months. She states she has decreased range of motion and decreased ability to reach overhead or behind herself she states the pain radiates from the shoulder down to her elbow. She states she's undergone shoulder arthroscopy in 2010 with manipulation under anesthesia debridement of the SLAP tear subscapularis adhesions debridement and distal clavicle resection.  Assessment & Plan: Visit Diagnoses:  1. Chronic left shoulder pain   2. Impingement syndrome of left shoulder     Plan: The left shoulder was injected from this posterior portal and subacromial space she tolerated this well follow-up in 4 weeks for repeat evaluation if patient is not showing any improvement we would need to consider evaluation for arthroscopic debridement for further rotator cuff pathology.  Follow-Up Instructions: Return in about 4 weeks (around 06/20/2017).   Ortho Exam  Patient is alert, oriented, no adenopathy, well-dressed, normal affect, normal respiratory effort. Examination patient has full abduction and flexion of the left shoulder. She has tenderness to palpation over the biceps tendon she has pain with Neer and Hawkins impingement test pain with drop arm test. The before meals joint is not tender to palpation.  Imaging: Xr Shoulder Left  Result Date: 05/23/2017 Three-view radiographs of the left shoulder shows a congruent glenohumeral joint she has slight superior migration of the humeral head slight amount of subcondylar sclerosis and an  inferior bony spur.   Labs: No results found for: HGBA1C, ESRSEDRATE, CRP, LABURIC, REPTSTATUS, GRAMSTAIN, CULT, LABORGA  Orders:  Orders Placed This Encounter  Procedures  . XR Shoulder Left   No orders of the defined types were placed in this encounter.    Procedures: No procedures performed  Clinical Data: No additional findings.  ROS:  All other systems negative, except as noted in the HPI. Review of Systems  Objective: Vital Signs: Ht 5\' 2"  (1.575 m)   Wt 169 lb (76.7 kg)   BMI 30.91 kg/m   Specialty Comments:  No specialty comments available.  PMFS History: Patient Active Problem List   Diagnosis Date Noted  . Gastroesophageal reflux disease without esophagitis 12/26/2016  . Grief at loss of child 11/09/2016  . Sternal fracture 10/06/2015  . Contusion of leg 10/06/2015  . Migraine headache 07/24/2013  . Insomnia 05/30/2013  . Fibromyalgia 05/30/2013   Past Medical History:  Diagnosis Date  . Anxiety   . Depression   . Fibromyalgia 2005  . Insomnia   . Migraine headache     Family History  Problem Relation Age of Onset  . Hypertension Maternal Grandmother   . Diabetes Maternal Grandmother   . Heart disease Maternal Grandmother 40       MI  . Hypertension Maternal Grandfather   . Throat cancer Maternal Grandfather   . Breast cancer Mother 48  . Hypertension Father     Past Surgical History:  Procedure Laterality Date  . ABDOMINAL  HYSTERECTOMY    . CESAREAN SECTION    . CHOLECYSTECTOMY  1988  . open heart surgery  1967  . PATENT DUCTUS ARTERIOUS REPAIR  1967  . SHOULDER SURGERY Left 2008   [facility] joint   Social History   Occupational History  . Not on file.   Social History Main Topics  . Smoking status: Never Smoker  . Smokeless tobacco: Never Used  . Alcohol use Yes     Comment: occassionally  . Drug use: No  . Sexual activity: Yes    Birth control/ protection: Surgical

## 2017-05-25 ENCOUNTER — Ambulatory Visit (INDEPENDENT_AMBULATORY_CARE_PROVIDER_SITE_OTHER): Payer: 59 | Admitting: Orthopedic Surgery

## 2017-06-01 ENCOUNTER — Ambulatory Visit (INDEPENDENT_AMBULATORY_CARE_PROVIDER_SITE_OTHER): Payer: 59 | Admitting: Orthopedic Surgery

## 2017-06-01 ENCOUNTER — Encounter (INDEPENDENT_AMBULATORY_CARE_PROVIDER_SITE_OTHER): Payer: Self-pay | Admitting: Orthopedic Surgery

## 2017-06-01 VITALS — Ht 62.0 in | Wt 169.0 lb

## 2017-06-01 DIAGNOSIS — M7502 Adhesive capsulitis of left shoulder: Secondary | ICD-10-CM

## 2017-06-01 NOTE — Progress Notes (Signed)
Office Visit Note   Patient: Nichole Brown           Date of Birth: Apr 12, 1966           MRN: [id number] Visit Date: 06/01/2017              Requested by: Mikey Kirschner, [address] [place], [place] [address] PCP: Mikey Kirschner, MD  Chief Complaint  Patient presents with  . Left Shoulder - Pain      HPI: Patient is a [age] woman who is status post left shoulder arthroscopy in 2010 about 8 years ago. 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 Patient states she has pain at night pain with activities of daily living in the left shoulder.  Assessment & Plan: Visit Diagnoses:  1. Adhesive capsulitis of left shoulder     Plan: Due to failure of a subacromial injection and lack of range of motion discussed that one option would be arthroscopic intervention for debridement of subscapularis adhesions subacromial decompression and debridement of any labral and rotator cuff pathology risks and benefits were discussed including persistent pain and need for additional surgery. Patient states she understands states she would like to proceed with surgery. She returns to [facility] in July.  Follow-Up Instructions: Return in about 2 weeks (around 06/15/2017).   Ortho Exam  Patient is alert, oriented, no adenopathy, well-dressed, normal affect, normal respiratory effort. Examination patient has a normal gait. She has full range of motion of right shoulder left shoulder she has abduction and flexion to 70. She has passive glenohumeral motion to 70. She has external rotation of 45 internal rotation of 10 she has pain to palpation of the biceps tendon. The before meals joint is nontender to palpation.  Imaging: No results found.  Labs: No results found for: HGBA1C, ESRSEDRATE, CRP, LABURIC, REPTSTATUS, GRAMSTAIN, CULT, LABORGA  Orders:  No orders of the defined types  were placed in this encounter.  No orders of the defined types were placed in this encounter.    Procedures: No procedures performed  Clinical Data: No additional findings.  ROS:  All other systems negative, except as noted in the HPI. Review of Systems  Objective: Vital Signs: Ht 5\' 2"  (1.575 m)   Wt 169 lb (76.7 kg)   BMI 30.91 kg/m   Specialty Comments:  No specialty comments available.  PMFS History: Patient Active Problem List   Diagnosis Date Noted  . 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 Gastroesophageal reflux disease without esophagitis 12/26/2016  . Grief at loss of child 11/09/2016  . Sternal fracture 10/06/2015  . Contusion of leg 10/06/2015  . Migraine headache 07/24/2013  . Insomnia 05/30/2013  . Fibromyalgia 05/30/2013   Past Medical History:  Diagnosis Date  . Anxiety   . Depression   . Fibromyalgia 2005  . Insomnia   . Migraine headache     Family History  Problem Relation Age of Onset  . Hypertension Maternal Grandmother   . Diabetes Maternal Grandmother   . Heart disease Maternal Grandmother 40       MI  . Hypertension Maternal Grandfather   . Throat cancer Maternal Grandfather   . Breast cancer Mother 28  . Hypertension Father     Past Surgical History:  Procedure Laterality Date  . ABDOMINAL HYSTERECTOMY    . CESAREAN SECTION    .  CHOLECYSTECTOMY  1988  . open heart surgery  1967  . PATENT DUCTUS ARTERIOUS REPAIR  1967  . SHOULDER SURGERY Left 2008   [facility] joint   Social History   Occupational History  . Not on file.   Social History Main Topics  . Smoking status: Never Smoker  . Smokeless tobacco: Never Used  . Alcohol use Yes     Comment: occassionally  . Drug use: No  . Sexual activity: Yes    Birth control/ protection: Surgical

## 2017-06-01 NOTE — Progress Notes (Signed)
Left

## 2017-06-02 ENCOUNTER — Encounter: Payer: Self-pay | Admitting: Cardiovascular Disease

## 2017-06-02 ENCOUNTER — Other Ambulatory Visit: Payer: 59

## 2017-06-02 DIAGNOSIS — H52223 Regular astigmatism, bilateral: Secondary | ICD-10-CM | POA: Diagnosis not present

## 2017-06-02 DIAGNOSIS — H524 Presbyopia: Secondary | ICD-10-CM | POA: Diagnosis not present

## 2017-06-02 DIAGNOSIS — H5213 Myopia, bilateral: Secondary | ICD-10-CM | POA: Diagnosis not present

## 2017-06-05 ENCOUNTER — Other Ambulatory Visit (INDEPENDENT_AMBULATORY_CARE_PROVIDER_SITE_OTHER): Payer: Self-pay | Admitting: Family

## 2017-06-12 ENCOUNTER — Emergency Department
Admission: EM | Admit: 2017-06-12 | Discharge: 2017-06-13 | Disposition: A | Discharge status: Home / Self Care | Payer: 59 | Attending: Emergency Medicine | Admitting: Emergency Medicine

## 2017-06-12 ENCOUNTER — Encounter: Payer: Self-pay | Admitting: Emergency Medicine

## 2017-06-12 DIAGNOSIS — R300 Dysuria: Secondary | ICD-10-CM | POA: Diagnosis not present

## 2017-06-12 DIAGNOSIS — L559 Sunburn, unspecified: Secondary | ICD-10-CM | POA: Insufficient documentation

## 2017-06-12 DIAGNOSIS — Z79899 Other long term (current) drug therapy: Secondary | ICD-10-CM | POA: Diagnosis not present

## 2017-06-12 DIAGNOSIS — R197 Diarrhea, unspecified: Secondary | ICD-10-CM | POA: Insufficient documentation

## 2017-06-12 DIAGNOSIS — IMO0001 Reserved for inherently not codable concepts without codable children: Secondary | ICD-10-CM

## 2017-06-12 DIAGNOSIS — T6291XA Toxic effect of unspecified noxious substance eaten as food, accidental (unintentional), initial encounter: Secondary | ICD-10-CM | POA: Diagnosis not present

## 2017-06-12 LAB — COMPREHENSIVE METABOLIC PANEL
ALT: 36 U/L (ref 14–54)
AST: 32 U/L (ref 15–41)
Albumin: 4.2 g/dL (ref 3.5–5.0)
Alkaline Phosphatase: 75 U/L (ref 38–126)
Anion gap: 7 (ref 5–15)
BUN: 12 mg/dL (ref 6–20)
CHLORIDE: 109 mmol/L (ref 101–111)
CO2: 26 mmol/L (ref 22–32)
CREATININE: 0.64 mg/dL (ref 0.44–1.00)
Calcium: 9.4 mg/dL (ref 8.9–10.3)
GFR calc Af Amer: >60 mL/min (ref >60)
GFR calc non Af Amer: >60 mL/min (ref >60)
Glucose, Bld: 94 mg/dL (ref 65–99)
Potassium: 3.5 mmol/L (ref 3.5–5.1)
SODIUM: 142 mmol/L (ref 135–145)
Total Bilirubin: 0.4 mg/dL (ref 0.3–1.2)
Total Protein: 7.2 g/dL (ref 6.5–8.1)

## 2017-06-12 LAB — CBC WITH DIFFERENTIAL/PLATELET
Basophils Absolute: 0.1 10*3/uL (ref 0–0.1)
Basophils Relative: 1 %
EOS ABS: 0.6 10*3/uL (ref 0–0.7)
EOS PCT: 6 %
HCT: 38.3 % (ref 35.0–47.0)
Hemoglobin: 12.8 g/dL (ref 12.0–16.0)
LYMPHS ABS: 4.2 10*3/uL — AB (ref 1.0–3.6)
Lymphocytes Relative: 43 %
MCH: 29.3 pg (ref 26.0–34.0)
MCHC: 33.3 g/dL (ref 32.0–36.0)
MCV: 88 fL (ref 80.0–100.0)
MONOS PCT: 8 %
Monocytes Absolute: 0.8 10*3/uL (ref 0.2–0.9)
Neutro Abs: 4 10*3/uL (ref 1.4–6.5)
Neutrophils Relative %: 42 %
PLATELETS: 205 10*3/uL (ref 150–440)
RBC: 4.35 MIL/uL (ref 3.80–5.20)
RDW: 13.6 % (ref 11.5–14.5)
WBC: 9.7 10*3/uL (ref 3.6–11.0)

## 2017-06-12 LAB — URINALYSIS, COMPLETE (UACMP) WITH MICROSCOPIC
Bacteria, UA: NONE SEEN
Bilirubin Urine: NEGATIVE
GLUCOSE, UA: NEGATIVE mg/dL
Hgb urine dipstick: NEGATIVE
Ketones, ur: NEGATIVE mg/dL
Leukocytes, UA: NEGATIVE
Nitrite: NEGATIVE
PROTEIN: NEGATIVE mg/dL
SQUAMOUS EPITHELIAL / LPF: NONE SEEN
Specific Gravity, Urine: 1.004 — ABNORMAL LOW (ref 1.005–1.030)
pH: 6 (ref 5.0–8.0)

## 2017-06-12 MED ORDER — SODIUM CHLORIDE 0.9 % IV BOLUS (SEPSIS)
1000.0000 mL | Freq: Once | INTRAVENOUS | Status: AC
  Administered 2017-06-12: 1000 mL via INTRAVENOUS

## 2017-06-12 MED ORDER — ONDANSETRON 4 MG PO TBDP: 4.0000 mg | ORAL_TABLET | Freq: Three times a day (TID) | ORAL | 0 refills | Status: DC | PRN

## 2017-06-12 MED ORDER — SILVER SULFADIAZINE 1 % EX CREA: TOPICAL_CREAM | CUTANEOUS | 1 refills | Status: DC

## 2017-06-12 NOTE — ED Triage Notes (Signed)
Pt arrived to the ED accompanied by her daughter for complaints of sunburn on her legs, leg swelling  And diarrhea. Pt is AOx4 in no apparent distress urinating multiple times a day.

## 2017-06-12 NOTE — Pre-Procedure Instructions (Signed)
    Nichole Brown  06/12/2017      [place], [place] [address] Phone: [phone] Fax: [phone]  [place], [place] [address] Phone: [phone] Fax: [phone]    Your procedure is scheduled on Friday June 16, 2017  Report to [facility] Admitting at 10:15 A.M.  Call this number if you have problems the morning of surgery:  [phone]   Remember:  Do not eat food or drink liquids after midnight. On June 15, 2017  Take these medicines the morning of surgery with A SIP OF WATER   albuterol (PROVENTIL HFA;VENTOLIN HFA), Lifitegrast (XIIDRA), ondansetron (ZOFRAN ODT). Stop taking as of today: ALL ASPIRIN, VITAMINS, FISH OILS, AND HERBAL MEDICATIONS. AS WELL AS ALL NSAIDS I.E. ADVIL, MOTRIN, ALEVE, ANAPROX, NAPROXEN, BC AND GOODY POWDER.  Do not wear jewelry, make-up or nail polish.  Do not wear lotions, powders, or perfumes, or deoderant.  Do not shave 48 hours prior to surgery.  Men may shave face and neck.  Do not bring valuables to the hospital.  [facility] is not responsible for any belongings or valuables.  Contacts, dentures or bridgework may not be worn into surgery.  Leave your suitcase in the car.  After surgery it may be brought to your room.  For patients admitted to the hospital, discharge time will be determined by your treatment team.  Patients discharged the day of surgery will not be allowed to drive home.   Special instructions: CHG HANDOUT  Please read over the following fact sheets that you were given. Pain Booklet, Coughing and Deep Breathing, MRSA Information and Surgical Site Infection Prevention

## 2017-06-12 NOTE — ED Provider Notes (Signed)
[facility] Emergency Department Provider Note  ____________________________________________  Time seen: Approximately 9:46 PM  I have reviewed the triage vital signs and the nursing notes.   HISTORY  Chief Complaint Sunburn; Nausea; and Emesis    HPI Nichole Brown is a 51 y.o. female who presents emergency department complaining ofsunburn to bilateral legs, dysuria, nausea, diarrhea. Patient reports that dysuria began 4 days ago. It has since improved somewhat with no medications. Patient did go to the beach and complains of sudden burn to bilateral lower extremities. She is also endorsing some nausea and diarrhea. She reports while at the beach she ate some food outside of her norm. She denies any emesis, any diarrhea, frank abdominal pain, fevers or chills. Patient reports that at this time she has minimal dysuria, no hematuria, no flank pain. Patient discharged in however and calamine lotion for sunburn. No other medications prior to arrival.   Past Medical History:  Diagnosis Date  . Anxiety   . Depression   . Fibromyalgia 2005  . Insomnia   . Migraine headache     Patient Active Problem List   Diagnosis Date Noted  . 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 Gastroesophageal reflux disease without esophagitis 12/26/2016  . Grief at loss of child 11/09/2016  . Sternal fracture 10/06/2015  . Contusion of leg 10/06/2015  . Migraine headache 07/24/2013  . Insomnia 05/30/2013  . Fibromyalgia 05/30/2013    Past Surgical History:  Procedure Laterality Date  . ABDOMINAL HYSTERECTOMY    . CESAREAN SECTION    . CHOLECYSTECTOMY  1988  . open heart surgery  1967  . PATENT DUCTUS ARTERIOUS REPAIR  1967  . SHOULDER SURGERY Left 2008   AC joint    Prior to Admission medications   Medication Sig Start Date End Date Taking? Authorizing Provider  albuterol (PROVENTIL HFA;VENTOLIN HFA) 108 (90 Base) MCG/ACT inhaler Inhale 2 puffs into the  lungs every 4 (four) hours as needed. Patient taking differently: Inhale 2 puffs into the lungs every 4 (four) hours as needed for wheezing or shortness of breath.  04/28/17   Nilda Simmer, NP  amoxicillin-clavulanate (AUGMENTIN) 875-125 MG tablet Take 1 tablet by mouth 2 (two) times daily. Patient not taking: Reported on 06/08/2017 04/28/17   Nilda Simmer, NP  benzonatate (TESSALON) 100 MG capsule Take 1 capsule (100 mg total) by mouth 3 (three) times daily as needed for cough. Patient not taking: Reported on 06/08/2017 04/28/17   Nilda Simmer, NP  gabapentin (NEURONTIN) 100 MG capsule Take 1 capsule (100 mg total) by mouth 2 (two) times daily. Patient not taking: Reported on 06/08/2017 04/28/17   Nilda Simmer, NP  Lifitegrast Shirley Friar) 5 % SOLN Place 1 drop into both eyes 2 (two) times daily.    [provider]  naproxen (NAPROSYN) 500 MG tablet Take 1 tablet (500 mg total) by mouth 2 (two) times daily with a meal. As needed for headache 04/28/17   Nilda Simmer, NP  ondansetron (ZOFRAN ODT) 4 MG disintegrating tablet Take 1 tablet (4 mg total) by mouth every 8 (eight) hours as needed for nausea or vomiting. Patient taking differently: Take 4 mg by mouth every 8 (eight) hours as needed (MIGRAINE).  01/18/17   Mikey Kirschner, MD  ondansetron (ZOFRAN-ODT) 4 MG disintegrating tablet Take 1 tablet (4 mg total) by mouth every 8 (eight) hours as needed for nausea or vomiting. 06/12/17   Oni Dietzman, Charline Bills, PA-C  pantoprazole (PROTONIX)  40 MG tablet Take 1 tablet (40 mg total) by mouth daily. For acid reflux Patient not taking: Reported on 06/08/2017 12/26/16   Nilda Simmer, NP  silver sulfADIAZINE (SILVADENE) 1 % cream Apply to affected area daily 06/12/17   Marieli Rudy, Charline Bills, PA-C    Allergies Cymbalta [duloxetine hcl]; Nortriptyline; and Topamax [topiramate]  Family History  Problem Relation Age of Onset  . Hypertension Maternal Grandmother   . Diabetes  Maternal Grandmother   . Heart disease Maternal Grandmother 40       MI  . Hypertension Maternal Grandfather   . Throat cancer Maternal Grandfather   . Breast cancer Mother 76  . Hypertension Father     Social History Social History  Substance Use Topics  . Smoking status: Never Smoker  . Smokeless tobacco: Never Used  . Alcohol use Yes     Comment: occassionally     Review of Systems  Constitutional: No fever/chills Eyes: No visual changes. No discharge ENT: No upper respiratory complaints. Cardiovascular: no chest pain. Respiratory: no cough. No SOB. Gastrointestinal: No abdominal pain.  No nausea, no vomiting.  Positive diarrhea.  No constipation. Genitourinary: Positive for dysuria. No hematuria Musculoskeletal: Negative for musculoskeletal pain. Skin: Negative for rash, abrasions, lacerations, ecchymosis. Positive for sunburn to bilateral lower extremities Neurological: Negative for headaches, focal weakness or numbness. 10-point ROS otherwise negative.  ____________________________________________   PHYSICAL EXAM:  VITAL SIGNS: ED Triage Vitals  Enc Vitals Group     BP 06/12/17 2035 (!) 142/73     Pulse Rate 06/12/17 2034 79     Resp 06/12/17 2034 16     Temp 06/12/17 2034 98 F (36.7 C)     Temp Source 06/12/17 2034 Oral     SpO2 06/12/17 2034 100 %     Weight 06/12/17 2035 169 lb (76.7 kg)     Height --      Head Circumference --      Peak Flow --      Pain Score 06/12/17 2041 8     Pain Loc --      Pain Edu? --      Excl. in [place]? --      Constitutional: Alert and oriented. Well appearing and in no acute distress. Eyes: Conjunctivae are normal. PERRL. EOMI. Head: Atraumatic. ENT:      Ears:       Nose: No congestion/rhinnorhea.      Mouth/Throat: Mucous membranes are moist.  Neck: No stridor.   Hematological/Lymphatic/Immunilogical: No cervical lymphadenopathy. Cardiovascular: Normal rate, regular rhythm. Normal S1 and S2.  Good peripheral  circulation. Respiratory: Normal respiratory effort without tachypnea or retractions. Lungs CTAB. Good air entry to the bases with no decreased or absent breath sounds. Gastrointestinal: Bowel sounds 4 quadrants. Soft and nontender to palpation. No guarding or rigidity. No palpable masses. No distention. No CVA tenderness Musculoskeletal: Full range of motion to all extremities. No gross deformities appreciated. Neurologic:  Normal speech and language. No gross focal neurologic deficits are appreciated.  Skin:  Skin is warm, dry and intact. No rash noted.First-degree burns noted to the anterior aspect of bilateral lower extremities. 2 or 3 small blisters are noted scattered over bilateral lower extremities. Psychiatric: Mood and affect are normal. Speech and behavior are normal. Patient exhibits appropriate insight and judgement.   ____________________________________________   LABS (all labs ordered are listed, but only abnormal results are displayed)  Labs Reviewed  URINALYSIS, COMPLETE (UACMP) WITH MICROSCOPIC - Abnormal; Notable for the following:  Result Value   Color, Urine STRAW (*)    APPearance CLEAR (*)    Specific Gravity, Urine 1.004 (*)    All other components within normal limits  CBC WITH DIFFERENTIAL/PLATELET - Abnormal; Notable for the following:    Lymphs Abs 4.2 (*)    All other components within normal limits  COMPREHENSIVE METABOLIC PANEL   ____________________________________________  EKG   ____________________________________________  RADIOLOGY   No results found.  ____________________________________________    PROCEDURES  Procedure(s) performed:    Procedures    Medications  sodium chloride 0.9 % bolus 1,000 mL (1,000 mLs Intravenous New Bag/Given 06/12/17 2239)     ____________________________________________   INITIAL IMPRESSION / ASSESSMENT AND PLAN / ED COURSE  Pertinent labs & imaging results that were available during my  care of the patient were reviewed by me and considered in my medical decision making (see chart for details).  Review of the [place] CSRS was performed in accordance of the [place] prior to dispensing any controlled drugs.     Patient's diagnosis is consistent with Sunburn to bilateral lower extremities and food poisoning. Patient presented with bilateral lower extremity sunburn, diarrhea, improving dysuria. Exam is reassuring. No indication for imaging. Labs were done and returned with reassuring results with no signs of infection.. Patient will be discharged home with prescriptions for Silvadene burn cream to use in addition aloe vera gel and calamine lotion. Patient is given Zofran for any nausea. Patient is to follow up with Primary care as needed or otherwise directed. Patient is given ED precautions to return to the ED for any worsening or new symptoms.     ____________________________________________  FINAL CLINICAL IMPRESSION(S) / ED DIAGNOSES  Final diagnoses:  Sunburn  Food poisoning, accidental or unintentional, initial encounter      NEW MEDICATIONS STARTED DURING THIS VISIT:  New Prescriptions   ONDANSETRON (ZOFRAN-ODT) 4 MG DISINTEGRATING TABLET    Take 1 tablet (4 mg total) by mouth every 8 (eight) hours as needed for nausea or vomiting.   SILVER SULFADIAZINE (SILVADENE) 1 % CREAM    Apply to affected area daily        This chart was dictated using voice recognition software/Dragon. Despite best efforts to proofread, errors can occur which can change the meaning. Any change was purely unintentional.    Darletta Moll, PA-C 06/13/17 0014    Nance Pear, MD 06/16/17 [phone]

## 2017-06-13 ENCOUNTER — Encounter (HOSPITAL_COMMUNITY): Payer: Self-pay

## 2017-06-13 ENCOUNTER — Telehealth (INDEPENDENT_AMBULATORY_CARE_PROVIDER_SITE_OTHER): Payer: Self-pay | Admitting: Orthopedic Surgery

## 2017-06-13 ENCOUNTER — Encounter (HOSPITAL_COMMUNITY)
Admission: RE | Admit: 2017-06-13 | Discharge: 2017-06-13 | Disposition: A | Discharge status: Home / Self Care | Payer: 59 | Source: Ambulatory Visit | Attending: Orthopedic Surgery | Admitting: Orthopedic Surgery

## 2017-06-13 DIAGNOSIS — M75111 Incomplete rotator cuff tear or rupture of right shoulder, not specified as traumatic: Secondary | ICD-10-CM | POA: Diagnosis not present

## 2017-06-13 DIAGNOSIS — S43431A Superior glenoid labrum lesion of right shoulder, initial encounter: Secondary | ICD-10-CM | POA: Diagnosis not present

## 2017-06-13 DIAGNOSIS — M25512 Pain in left shoulder: Secondary | ICD-10-CM | POA: Diagnosis not present

## 2017-06-13 DIAGNOSIS — M7502 Adhesive capsulitis of left shoulder: Secondary | ICD-10-CM | POA: Diagnosis not present

## 2017-06-13 DIAGNOSIS — X58XXXA Exposure to other specified factors, initial encounter: Secondary | ICD-10-CM | POA: Diagnosis not present

## 2017-06-13 HISTORY — DX: Pneumonia, unspecified organism: J18.9

## 2017-06-13 HISTORY — DX: Gastro-esophageal reflux disease without esophagitis: K21.9

## 2017-06-13 HISTORY — DX: Bronchitis, not specified as acute or chronic: J40

## 2017-06-13 LAB — SURGICAL PCR SCREEN
MRSA, PCR: NEGATIVE
Staphylococcus aureus: NEGATIVE

## 2017-06-13 NOTE — Telephone Encounter (Signed)
You recall recommend patient elevate her legs she can use a knee-high medical compression stocking that she get over-the-counter at a medical supply store. She should be okay to proceed with surgery.

## 2017-06-13 NOTE — Telephone Encounter (Signed)
Walk in-pt wanted to let dr know came back from vacation on 06-12-17 was admitted to ER last night due to sun and food poisoning. Pt states both feet are very swollen-pt wondering if surgery will still go ahead as scheduled on 06-16-17

## 2017-06-13 NOTE — Progress Notes (Signed)
PCP: Dr. Wolfgang Phoenix Cardiologist: Dr Hildred Laser  Pt states she only established cardiology care as it related to her severe migraines with syncopal episodes.  EKG: 02/2017 CXR: 10/2016 Stress Test: 03/2017  Pt visited ED 06/12/2017 for sunburns to bilateral legs and "food poisoning."  Labs done is ED.  Pt instructed to call surgeon's office to make aware of recent ED visit.  Pt denies sunburn to shoulder, only legs.  Patient denies shortness of breath, fever, cough, and chest pain at PAT appointment.  Patient verbalized understanding of instructions provided today at the PAT appointment.  Patient asked to review instructions at home and day of surgery.

## 2017-06-13 NOTE — Telephone Encounter (Signed)
Pt is sch for a shoulder scope on 06/16/17 please see message below and advise.

## 2017-06-15 NOTE — Progress Notes (Signed)
Anesthesia Chart Review:  Pt is a 51 year old female scheduled for L shoulder arthroscopy, debridement, and decompression on 06/16/2017 with Meridee Score, MD  - PCP is Sallee Lange, MD - Saw cardiologist Kathlyn Sacramento, MD 03/09/17 for dizziness associated with migraines, chest tightness with emotion (none with exercise).  ETT and echo ordered.  ETT results below, echo not done.   PMH includes:  PDA (s/p repair in 1967), migraine, GERD. Never smoker. BMI 31  Medications include: albuterol, protonix  Preoperative labs reviewed.    CXR 10/17/16: No active cardiopulmonary disease.  EKG 03/09/17: NSR  Exercise tolerance test 03/28/17:  - Normal treadmill stress test with no evidence of ischemia. - Normal exercise capacity. She exercised for 9 minutes and achieved a maximum heart rate of 155 bpm. - Resting hypotension with a blood pressure of 88/67 with normal blood pressure response to exercise.  Reviewed case with Dr. Conrad Windsor.  If no changes, I anticipate pt can proceed with surgery as scheduled.   Willeen Cass, FNP-BC [facility] Short Stay Surgical [facility]/Anesthesiology Phone: [phone] 06/15/2017 11:33 AM

## 2017-06-15 NOTE — Telephone Encounter (Signed)
Called and lm on vm to advise of instructions below. To call with questions.

## 2017-06-16 ENCOUNTER — Ambulatory Visit (HOSPITAL_COMMUNITY): Payer: 59 | Admitting: Emergency Medicine

## 2017-06-16 ENCOUNTER — Encounter (HOSPITAL_COMMUNITY): Payer: Self-pay | Admitting: *Deleted

## 2017-06-16 ENCOUNTER — Ambulatory Visit (HOSPITAL_COMMUNITY): Payer: 59 | Admitting: Anesthesiology

## 2017-06-16 ENCOUNTER — Encounter (HOSPITAL_COMMUNITY)
Admission: RE | Disposition: A | Discharge status: Home / Self Care | Payer: Self-pay | Source: Ambulatory Visit | Attending: Orthopedic Surgery

## 2017-06-16 ENCOUNTER — Ambulatory Visit (HOSPITAL_COMMUNITY)
Admission: RE | Admit: 2017-06-16 | Discharge: 2017-06-16 | Disposition: A | Discharge status: Home / Self Care | Payer: 59 | Source: Ambulatory Visit | Attending: Orthopedic Surgery | Admitting: Orthopedic Surgery

## 2017-06-16 DIAGNOSIS — M7502 Adhesive capsulitis of left shoulder: Secondary | ICD-10-CM | POA: Insufficient documentation

## 2017-06-16 DIAGNOSIS — X58XXXA Exposure to other specified factors, initial encounter: Secondary | ICD-10-CM | POA: Insufficient documentation

## 2017-06-16 DIAGNOSIS — M7542 Impingement syndrome of left shoulder: Secondary | ICD-10-CM | POA: Diagnosis not present

## 2017-06-16 DIAGNOSIS — G8918 Other acute postprocedural pain: Secondary | ICD-10-CM | POA: Diagnosis not present

## 2017-06-16 DIAGNOSIS — S43431A Superior glenoid labrum lesion of right shoulder, initial encounter: Secondary | ICD-10-CM | POA: Diagnosis not present

## 2017-06-16 DIAGNOSIS — M75111 Incomplete rotator cuff tear or rupture of right shoulder, not specified as traumatic: Secondary | ICD-10-CM | POA: Diagnosis not present

## 2017-06-16 DIAGNOSIS — K219 Gastro-esophageal reflux disease without esophagitis: Secondary | ICD-10-CM | POA: Diagnosis not present

## 2017-06-16 DIAGNOSIS — M25512 Pain in left shoulder: Secondary | ICD-10-CM | POA: Diagnosis not present

## 2017-06-16 HISTORY — PX: SHOULDER ARTHROSCOPY: SHX128

## 2017-06-16 SURGERY — ARTHROSCOPY, SHOULDER
Anesthesia: General | Site: Shoulder | Laterality: Left

## 2017-06-16 MED ORDER — FENTANYL CITRATE (PF) 100 MCG/2ML IJ SOLN
50.0000 ug | INTRAMUSCULAR | Status: DC | PRN
  Administered 2017-06-16: 50 ug via INTRAVENOUS

## 2017-06-16 MED ORDER — PROMETHAZINE HCL 25 MG/ML IJ SOLN
6.2500 mg | Freq: Once | INTRAMUSCULAR | Status: AC
  Administered 2017-06-16: 6.25 mg via INTRAVENOUS

## 2017-06-16 MED ORDER — ARTIFICIAL TEARS OPHTHALMIC OINT
TOPICAL_OINTMENT | OPHTHALMIC | Status: AC
  Filled 2017-06-16: qty 3.5

## 2017-06-16 MED ORDER — CEFAZOLIN SODIUM-DEXTROSE 2-4 GM/100ML-% IV SOLN
INTRAVENOUS | Status: AC
  Filled 2017-06-16: qty 100

## 2017-06-16 MED ORDER — BUPIVACAINE-EPINEPHRINE (PF) 0.5% -1:200000 IJ SOLN
INTRAMUSCULAR | Status: DC | PRN
  Administered 2017-06-16: 25 mL via PERINEURAL

## 2017-06-16 MED ORDER — DEXAMETHASONE SODIUM PHOSPHATE 10 MG/ML IJ SOLN
INTRAMUSCULAR | Status: DC | PRN
  Administered 2017-06-16: 10 mg via INTRAVENOUS

## 2017-06-16 MED ORDER — SUGAMMADEX SODIUM 200 MG/2ML IV SOLN
INTRAVENOUS | Status: AC
  Filled 2017-06-16: qty 2

## 2017-06-16 MED ORDER — ROCURONIUM BROMIDE 50 MG/5ML IV SOLN
INTRAVENOUS | Status: AC
  Filled 2017-06-16: qty 1

## 2017-06-16 MED ORDER — CHLORHEXIDINE GLUCONATE 4 % EX LIQD: 60.0000 mL | Freq: Once | CUTANEOUS | Status: DC

## 2017-06-16 MED ORDER — PROMETHAZINE HCL 25 MG/ML IJ SOLN
INTRAMUSCULAR | Status: AC
  Filled 2017-06-16: qty 1

## 2017-06-16 MED ORDER — FENTANYL CITRATE (PF) 250 MCG/5ML IJ SOLN
INTRAMUSCULAR | Status: AC
  Filled 2017-06-16: qty 5

## 2017-06-16 MED ORDER — SUCCINYLCHOLINE CHLORIDE 200 MG/10ML IV SOSY
PREFILLED_SYRINGE | INTRAVENOUS | Status: DC | PRN
  Administered 2017-06-16: 140 mg via INTRAVENOUS

## 2017-06-16 MED ORDER — ROCURONIUM BROMIDE 100 MG/10ML IV SOLN
INTRAVENOUS | Status: DC | PRN
  Administered 2017-06-16: 20 mg via INTRAVENOUS

## 2017-06-16 MED ORDER — LACTATED RINGERS IV SOLN
INTRAVENOUS | Status: DC
  Administered 2017-06-16: 11:00:00 via INTRAVENOUS

## 2017-06-16 MED ORDER — PROPOFOL 500 MG/50ML IV EMUL
INTRAVENOUS | Status: DC | PRN
  Administered 2017-06-16: 150 ug/kg/min via INTRAVENOUS

## 2017-06-16 MED ORDER — FENTANYL CITRATE (PF) 100 MCG/2ML IJ SOLN
INTRAMUSCULAR | Status: AC
  Administered 2017-06-16: 50 ug via INTRAVENOUS
  Filled 2017-06-16: qty 2

## 2017-06-16 MED ORDER — MIDAZOLAM HCL 2 MG/2ML IJ SOLN
INTRAMUSCULAR | Status: AC
  Filled 2017-06-16: qty 2

## 2017-06-16 MED ORDER — DEXAMETHASONE SODIUM PHOSPHATE 10 MG/ML IJ SOLN
INTRAMUSCULAR | Status: AC
  Filled 2017-06-16: qty 1

## 2017-06-16 MED ORDER — LIDOCAINE HCL (CARDIAC) 20 MG/ML IV SOLN
INTRAVENOUS | Status: AC
  Filled 2017-06-16: qty 5

## 2017-06-16 MED ORDER — ONDANSETRON HCL 4 MG/2ML IJ SOLN
4.0000 mg | Freq: Once | INTRAMUSCULAR | Status: AC
  Administered 2017-06-16: 4 mg via INTRAVENOUS

## 2017-06-16 MED ORDER — PROPOFOL 10 MG/ML IV BOLUS
INTRAVENOUS | Status: DC | PRN
  Administered 2017-06-16: 150 mg via INTRAVENOUS
  Administered 2017-06-16: 50 mg via INTRAVENOUS
  Administered 2017-06-16: 100 mg via INTRAVENOUS

## 2017-06-16 MED ORDER — MIDAZOLAM HCL 2 MG/2ML IJ SOLN
INTRAMUSCULAR | Status: AC
  Administered 2017-06-16: 2 mg via INTRAVENOUS
  Filled 2017-06-16: qty 2

## 2017-06-16 MED ORDER — HYDROCODONE-ACETAMINOPHEN 5-325 MG PO TABS: 1.0000 | ORAL_TABLET | Freq: Four times a day (QID) | ORAL | 0 refills | Status: DC | PRN

## 2017-06-16 MED ORDER — PHENYLEPHRINE 40 MCG/ML (10ML) SYRINGE FOR IV PUSH (FOR BLOOD PRESSURE SUPPORT)
PREFILLED_SYRINGE | INTRAVENOUS | Status: AC
  Filled 2017-06-16: qty 10

## 2017-06-16 MED ORDER — CEFAZOLIN SODIUM-DEXTROSE 2-4 GM/100ML-% IV SOLN
2.0000 g | INTRAVENOUS | Status: AC
  Administered 2017-06-16: 2 g via INTRAVENOUS

## 2017-06-16 MED ORDER — PROPOFOL 10 MG/ML IV BOLUS
INTRAVENOUS | Status: AC
  Filled 2017-06-16: qty 20

## 2017-06-16 MED ORDER — ONDANSETRON HCL 4 MG/2ML IJ SOLN
INTRAMUSCULAR | Status: AC
  Filled 2017-06-16: qty 2

## 2017-06-16 MED ORDER — ONDANSETRON HCL 4 MG/2ML IJ SOLN: 4.0000 mg | Freq: Once | INTRAMUSCULAR | Status: DC | PRN

## 2017-06-16 MED ORDER — ONDANSETRON HCL 4 MG/2ML IJ SOLN
INTRAMUSCULAR | Status: DC | PRN
  Administered 2017-06-16: 4 mg via INTRAVENOUS

## 2017-06-16 MED ORDER — HYDROMORPHONE HCL 1 MG/ML IJ SOLN: 0.2500 mg | INTRAMUSCULAR | Status: DC | PRN

## 2017-06-16 MED ORDER — LIDOCAINE HCL (CARDIAC) 20 MG/ML IV SOLN
INTRAVENOUS | Status: DC | PRN
  Administered 2017-06-16: 100 mg via INTRAVENOUS

## 2017-06-16 MED ORDER — MEPERIDINE HCL 25 MG/ML IJ SOLN: 6.2500 mg | INTRAMUSCULAR | Status: DC | PRN

## 2017-06-16 MED ORDER — SUGAMMADEX SODIUM 200 MG/2ML IV SOLN
INTRAVENOUS | Status: DC | PRN
  Administered 2017-06-16: 150 mg via INTRAVENOUS

## 2017-06-16 MED ORDER — EPHEDRINE 5 MG/ML INJ
INTRAVENOUS | Status: AC
  Filled 2017-06-16: qty 10

## 2017-06-16 MED ORDER — LACTATED RINGERS IV SOLN
INTRAVENOUS | Status: DC | PRN
  Administered 2017-06-16: 11:00:00 via INTRAVENOUS

## 2017-06-16 MED ORDER — ONDANSETRON HCL 4 MG/2ML IJ SOLN
INTRAMUSCULAR | Status: DC
Start: 2017-06-16 — End: 2017-06-16
  Filled 2017-06-16: qty 2

## 2017-06-16 MED ORDER — MIDAZOLAM HCL 2 MG/2ML IJ SOLN
1.0000 mg | INTRAMUSCULAR | Status: DC | PRN
  Administered 2017-06-16: 2 mg via INTRAVENOUS

## 2017-06-16 SURGICAL SUPPLY — 39 items
BLADE GREAT WHITE 4.2 (BLADE) ×2 IMPLANT
BUR OVAL 6.0 (BURR) ×2 IMPLANT
CANNULA SHOULDER 7CM (CANNULA) ×2 IMPLANT
COVER SURGICAL LIGHT HANDLE (MISCELLANEOUS) ×4 IMPLANT
DRAPE IMP U-DRAPE 54X76 (DRAPES) ×2 IMPLANT
DRAPE INCISE IOBAN 66X45 STRL (DRAPES) ×2 IMPLANT
DRAPE ORTHO SPLIT 77X108 STRL (DRAPES) ×4
DRAPE STERI 35X30 U-POUCH (DRAPES) ×2 IMPLANT
DRAPE SURG ORHT 6 SPLT 77X108 (DRAPES) IMPLANT
DRAPE U-SHAPE 47X51 STRL (DRAPES) ×2 IMPLANT
DRSG EMULSION OIL 3X3 NADH (GAUZE/BANDAGES/DRESSINGS) ×2 IMPLANT
DRSG PAD ABDOMINAL 8X10 ST (GAUZE/BANDAGES/DRESSINGS) ×4 IMPLANT
DURAPREP 26ML APPLICATOR (WOUND CARE) ×2 IMPLANT
GAUZE SPONGE 4X4 12PLY STRL (GAUZE/BANDAGES/DRESSINGS) ×2 IMPLANT
GAUZE SPONGE 4X4 12PLY STRL LF (GAUZE/BANDAGES/DRESSINGS) ×1 IMPLANT
GLOVE BIOGEL PI IND STRL 9 (GLOVE) ×1 IMPLANT
GLOVE BIOGEL PI INDICATOR 9 (GLOVE) ×1
GLOVE SURG ORTHO 9.0 STRL STRW (GLOVE) ×2 IMPLANT
GOWN STRL REUS W/ TWL XL LVL3 (GOWN DISPOSABLE) ×2 IMPLANT
GOWN STRL REUS W/TWL XL LVL3 (GOWN DISPOSABLE) ×4
KIT BASIN OR (CUSTOM PROCEDURE TRAY) ×2 IMPLANT
KIT ROOM TURNOVER OR (KITS) ×2 IMPLANT
MANIFOLD NEPTUNE II (INSTRUMENTS) ×2 IMPLANT
NDL SPNL 18GX3.5 QUINCKE PK (NEEDLE) ×1 IMPLANT
NEEDLE SPNL 18GX3.5 QUINCKE PK (NEEDLE) ×2 IMPLANT
NS IRRIG 1000ML POUR BTL (IV SOLUTION) ×2 IMPLANT
PACK SHOULDER (CUSTOM PROCEDURE TRAY) ×2 IMPLANT
PACK UNIVERSAL I (CUSTOM PROCEDURE TRAY) ×2 IMPLANT
PAD ABD 8X10 STRL (GAUZE/BANDAGES/DRESSINGS) ×1 IMPLANT
PAD ARMBOARD 7.5X6 YLW CONV (MISCELLANEOUS) ×4 IMPLANT
SET ARTHROSCOPY TUBING (MISCELLANEOUS) ×2
SET ARTHROSCOPY TUBING LN (MISCELLANEOUS) ×1 IMPLANT
SLING ARM IMMOBILIZER XL (CAST SUPPLIES) ×2 IMPLANT
SPONGE LAP 4X18 X RAY DECT (DISPOSABLE) ×2 IMPLANT
SUT ETHILON 2 0 FS 18 (SUTURE) ×2 IMPLANT
TAPE CLOTH SURG 6X10 WHT LF (GAUZE/BANDAGES/DRESSINGS) ×1 IMPLANT
TOWEL OR 17X24 6PK STRL BLUE (TOWEL DISPOSABLE) ×4 IMPLANT
WAND HAND CNTRL MULTIVAC 90 (MISCELLANEOUS) IMPLANT
WATER STERILE IRR 1000ML POUR (IV SOLUTION) ×2 IMPLANT

## 2017-06-16 NOTE — Anesthesia Procedure Notes (Signed)
Procedure Name: Intubation Date/Time: 06/16/2017 12:48 PM Performed by: Orlie Dakin Pre-anesthesia Checklist: Patient identified, Emergency Drugs available, Suction available, Patient being monitored and Timeout performed Patient Re-evaluated:Patient Re-evaluated prior to inductionOxygen Delivery Method: Circle system utilized Preoxygenation: Pre-oxygenation with 100% oxygen Intubation Type: IV induction, Cricoid Pressure applied and Rapid sequence Laryngoscope Size: Miller and 3 Grade View: Grade I Tube type: Oral Tube size: 7.5 mm Number of attempts: 1 Airway Equipment and Method: Stylet Placement Confirmation: ETT inserted through vocal cords under direct vision,  positive ETCO2 and breath sounds checked- equal and bilateral Secured at: 22 cm Tube secured with: Tape Dental Injury: Teeth and Oropharynx as per pre-operative assessment  Comments: Patient complains of nausea pre-op, no vomiting, RSI performed, 4x4s bite block used.

## 2017-06-16 NOTE — H&P (Signed)
Nichole Brown is an 51 y.o. female.   Chief Complaint: Painful frozen left shoulder HPI: Patient is a 51 year old woman who is 10 years status post left shoulder arthroscopy. Patient has developed progressive adhesive capsulitis with decreasing range of motion and ability to perform activities of daily living. She has failed conservative treatment including injections and range of motion exercises.  Past Medical History:  Diagnosis Date  . Anxiety   . Bronchitis   . Depression   . Fibromyalgia 2005  . GERD (gastroesophageal reflux disease)   . Insomnia   . Migraine headache   . Pneumonia     Past Surgical History:  Procedure Laterality Date  . ABDOMINAL HYSTERECTOMY    . CESAREAN SECTION    . CHOLECYSTECTOMY  1988  . open heart surgery  1967  . PATENT DUCTUS ARTERIOUS REPAIR  1967  . SHOULDER SURGERY Left 2008   [facility] joint    Family History  Problem Relation Age of Onset  . Hypertension Maternal Grandmother   . Diabetes Maternal Grandmother   . Heart disease Maternal Grandmother 40       MI  . Hypertension Maternal Grandfather   . Throat cancer Maternal Grandfather   . Breast cancer Mother 44  . Hypertension Father    Social History:  reports that she has never smoked. She has never used smokeless tobacco. She reports that she drinks alcohol. She reports that she does not use drugs.  Allergies:  Allergies  Allergen Reactions  . Cymbalta [Duloxetine Hcl] Other (See Comments)    Hallucinations; sleep walking  . Nortriptyline Nausea And Vomiting  . Topamax [Topiramate] Other (See Comments)    Dry eyes, visual changes; advised to stop med by her optometrist    No prescriptions prior to admission.    No results found for this or any previous visit (from the past 48 hour(s)). No results found.  Review of Systems  All other systems reviewed and are negative.   There were no vitals taken for this visit. Physical Exam  Patient is alert oriented no adenopathy  well-dressed normal affect normal respiratory effort she has a normal gait. Examination of the left shoulder she has abduction and flexion to 70 she is external rotation of 45 internal rotation of 10. Assessment/Plan Assessment: Adhesive capsulitis left shoulder.  Plan: We'll plan for left shoulder arthroscopy with subacromial decompression debridement of the subscapularis adhesions. Risks and benefits were discussed including infection recurrence of the adhesive capsulitis. Patient states she understands and wishes to proceed at this time.  Newt Minion, MD 06/16/2017, 6:32 AM

## 2017-06-16 NOTE — Op Note (Signed)
06/16/2017  1:37 PM  PATIENT:  [name]    PRE-OPERATIVE DIAGNOSIS:  Frozen Left Shoulder  POST-OPERATIVE DIAGNOSIS:  Same  PROCEDURE:  LEFT SHOULDER ARTHROSCOPY, DEBRIDEMENT, AND DECOMPRESSION  SURGEON:  Newt Minion, MD  PHYSICIAN ASSISTANT:None ANESTHESIA:   General  PREOPERATIVE INDICATIONS:  MARETA CHESNUT is a  51 y.o. female with a diagnosis of Frozen Left Shoulder who failed conservative measures and elected for surgical management.    The risks benefits and alternatives were discussed with the patient preoperatively including but not limited to the risks of infection, bleeding, nerve injury, cardiopulmonary complications, the need for revision surgery, among others, and the patient was willing to proceed.  OPERATIVE IMPLANTS: None  OPERATIVE FINDINGS: Adhesions to the subscapularis as well as spurring of the acromion  OPERATIVE PROCEDURE: Patient brought the operating room and underwent a general anesthetic after a scalene block. After adequate levels of anesthesia were obtained patient's was placed in the beachchair position and the left upper extremity was prepped using DuraPrep draped into a sterile field a timeout was called. The scope was inserted through the posterior portal and anterior portal was established with outside in technique. Visualization showed scarring down to the subscapularis. The electrical 1 was used to debride the scar tissue from the subscapularis. There is also grade 1 slap lesion which was debrided the biceps tendon was intact and there was partial tearing of the rotator cuff and this was debrided there was good attachment of the rotator cuff along the humeral head. The shaver and the vapor wand were both used for debridement. The instruments were removed the scope was then inserted from the posterior portal and the subacromial space and a new lateral portal was established. Patient did have spurring of the acromion and patient underwent  subacromial decompression with the bur. He should and had significant for sinus and the bursa was resected there was partial tearing the rotator cuff this was also debrided. Electrocautery wand was used for hemostasis. There was good crit subacromial space after debridement. The enhancements were removed the portals were closed using 3-0 nylon a sterile dressing was applied patient was placed in a sling extubated taken to the PACU in stable condition.

## 2017-06-16 NOTE — Anesthesia Procedure Notes (Signed)
Anesthesia Regional Block: Interscalene brachial plexus block   Pre-Anesthetic Checklist: ,, timeout performed, Correct Patient, Correct Site, Correct Laterality, Correct Procedure, Correct Position, site marked, Risks and benefits discussed, pre-op evaluation,  At surgeon's request and post-op pain management  Laterality: Left  Prep: chloraprep       Needles:   Needle Type: Echogenic Needle     Needle Length: 9cm  Needle Gauge: 21     Additional Needles:   Narrative:  Start time: 06/16/2017 11:05 AM End time: 06/16/2017 11:15 AM Injection made incrementally with aspirations every 5 mL. Anesthesiologist: Lyndle Herrlich

## 2017-06-16 NOTE — Progress Notes (Signed)
Patient c/o nausea shortly after nerve block, Dr. Seward Speck notified, new orders received. Will continue to monitor.

## 2017-06-16 NOTE — Anesthesia Preprocedure Evaluation (Signed)
Anesthesia Evaluation  Patient identified by MRN, date of birth, ID band Patient awake    Reviewed: Allergy & Precautions, H&P , Patient's Chart, lab work & pertinent test results, reviewed documented beta blocker date and time   Airway Mallampati: II  TM Distance: >3 FB Neck ROM: full    Dental no notable dental hx.    Pulmonary    Pulmonary exam normal breath sounds clear to auscultation       Cardiovascular  Rhythm:regular Rate:Normal     Neuro/Psych    GI/Hepatic   Endo/Other    Renal/GU      Musculoskeletal   Abdominal   Peds  Hematology   Anesthesia Other Findings   Reproductive/Obstetrics                             Anesthesia Physical Anesthesia Plan  ASA: II  Anesthesia Plan: General   Post-op Pain Management:  Regional for Post-op pain   Induction: Intravenous  PONV Risk Score and Plan:   Airway Management Planned: Oral ETT  Additional Equipment:   Intra-op Plan:   Post-operative Plan: Extubation in OR  Informed Consent: I have reviewed the patients History and Physical, chart, labs and discussed the procedure including the risks, benefits and alternatives for the proposed anesthesia with the patient or authorized representative who has indicated his/her understanding and acceptance.   Dental Advisory Given  Plan Discussed with: CRNA and Surgeon  Anesthesia Plan Comments: (  )        Anesthesia Quick Evaluation  

## 2017-06-16 NOTE — Transfer of Care (Signed)
Immediate Anesthesia Transfer of Care Note  Patient: Nichole Brown  Procedure(s) Performed: Procedure(s): LEFT SHOULDER ARTHROSCOPY, DEBRIDEMENT, AND DECOMPRESSION (Left)  Patient Location: PACU  Anesthesia Type:General  Level of Consciousness: awake and patient cooperative  Airway & Oxygen Therapy: Patient Spontanous Breathing and Patient connected to nasal cannula oxygen  Post-op Assessment: Report given to RN and Post -op Vital signs reviewed and stable  Post vital signs: Reviewed and stable  Last Vitals:  Vitals:   06/16/17 1110 06/16/17 1115  BP: 122/66 113/66  Pulse: 64 77  Resp: (!) 39 20  Temp:      Last Pain:  Vitals:   06/16/17 1021  TempSrc:   PainSc: 2       Patients Stated Pain Goal: 3 (88/33/74 4514)  Complications: No apparent anesthesia complications

## 2017-06-16 NOTE — Anesthesia Postprocedure Evaluation (Signed)
Anesthesia Post Note  Patient: Nichole Brown  Procedure(s) Performed: Procedure(s) (LRB): LEFT SHOULDER ARTHROSCOPY, DEBRIDEMENT, AND DECOMPRESSION (Left)     Patient location during evaluation: PACU Anesthesia Type: General Level of consciousness: awake and alert Pain management: pain level controlled Vital Signs Assessment: post-procedure vital signs reviewed and stable Respiratory status: spontaneous breathing, nonlabored ventilation, respiratory function stable and patient connected to nasal cannula oxygen Cardiovascular status: blood pressure returned to baseline and stable Postop Assessment: no signs of nausea or vomiting Anesthetic complications: no    Last Vitals:  Vitals:   06/16/17 1432 06/16/17 1445  BP: (!) 114/52   Pulse: 65 (!) 57  Resp: 17 15  Temp:      Last Pain:  Vitals:   06/16/17 1429  TempSrc:   PainSc: 0-No pain                 Carolan Avedisian DAVID

## 2017-06-17 ENCOUNTER — Encounter (HOSPITAL_COMMUNITY): Payer: Self-pay | Admitting: Orthopedic Surgery

## 2017-06-20 ENCOUNTER — Ambulatory Visit (INDEPENDENT_AMBULATORY_CARE_PROVIDER_SITE_OTHER): Payer: 59 | Admitting: Orthopedic Surgery

## 2017-06-23 ENCOUNTER — Ambulatory Visit (INDEPENDENT_AMBULATORY_CARE_PROVIDER_SITE_OTHER): Payer: 59 | Admitting: Family

## 2017-06-23 DIAGNOSIS — M7542 Impingement syndrome of left shoulder: Secondary | ICD-10-CM

## 2017-06-23 NOTE — Progress Notes (Signed)
   Post-Op Visit Note   Patient: Nichole Brown           Date of Birth: 1966/09/11           MRN: [id number] Visit Date: 06/23/2017 PCP: Mikey Kirschner, MD  Chief Complaint:  Chief Complaint  Patient presents with  . Left Shoulder - Routine Post Op    7 days post op shoulder scope, debridement and decompression     HPI:  The patient is 51 year old woman who presents today one week status post left shoulder arthroscopy and debridement for adhesive Capsulitis and impingement. States she is feeling about the same she did prior to surgery she complains that she continues to have some reduced range of motion of her left shoulder. Complains of what appears to be biceps tendon pain when trying to slowly lower her left arm.    Left Shoulder Exam   Tenderness  The patient is experiencing tenderness in the biceps tendon.  Range of Motion  Active Abduction: 90   Tests  Impingement: negative  Comments:  Pain with drop arm      Visit Diagnoses:  1. Impingement syndrome of left shoulder     Plan: Work aggressively on range of motion. Encouraged her to use ice and naproxen. Sutures were harvested.  Follow-Up Instructions: Return in about 2 weeks (around 07/07/2017).   Imaging: No results found.  Orders:  No orders of the defined types were placed in this encounter.  No orders of the defined types were placed in this encounter.    PMFS History: Patient Active Problem List   Diagnosis Date Noted  . Impingement syndrome of left shoulder   . 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 Gastroesophageal reflux disease without esophagitis 12/26/2016  . Grief at loss of child 11/09/2016  . Sternal fracture 10/06/2015  . Contusion of leg 10/06/2015  . Migraine headache 07/24/2013  . Insomnia 05/30/2013  . Fibromyalgia 05/30/2013   Past Medical History:  Diagnosis Date  . Anxiety   . Bronchitis   . Depression   . Fibromyalgia 2005  . GERD (gastroesophageal  reflux disease)   . Insomnia   . Migraine headache   . Pneumonia     Family History  Problem Relation Age of Onset  . Hypertension Maternal Grandmother   . Diabetes Maternal Grandmother   . Heart disease Maternal Grandmother 40       MI  . Hypertension Maternal Grandfather   . Throat cancer Maternal Grandfather   . Breast cancer Mother 65  . Hypertension Father     Past Surgical History:  Procedure Laterality Date  . ABDOMINAL HYSTERECTOMY    . CESAREAN SECTION    . CHOLECYSTECTOMY  1988  . open heart surgery  1967  . PATENT DUCTUS ARTERIOUS REPAIR  1967  . SHOULDER ARTHROSCOPY Left 06/16/2017   Procedure: LEFT SHOULDER ARTHROSCOPY, DEBRIDEMENT, AND DECOMPRESSION;  Surgeon: Newt Minion, MD;  Location: [place];  Service: Orthopedics;  Laterality: Left;  . SHOULDER SURGERY Left 2008   [facility]   Social History   Occupational History  . Not on file.   Social History Main Topics  . Smoking status: Never Smoker  . Smokeless tobacco: Never Used  . Alcohol use Yes     Comment: occassionally  . Drug use: No  . Sexual activity: Yes    Birth control/ protection: Surgical

## 2017-06-27 DIAGNOSIS — G43709 Chronic migraine without aura, not intractable, without status migrainosus: Secondary | ICD-10-CM | POA: Diagnosis not present

## 2017-06-29 ENCOUNTER — Telehealth (INDEPENDENT_AMBULATORY_CARE_PROVIDER_SITE_OTHER): Payer: Self-pay | Admitting: Orthopedic Surgery

## 2017-06-29 NOTE — Telephone Encounter (Signed)
Patient called inquiring if we received disability form from [employer]. I advised forms are here with [employer] and is $25.00. She will come drop off check today.

## 2017-06-29 NOTE — Telephone Encounter (Signed)
Pt dropped off personal check for [employer] to complete and fax [employer] disability forms.

## 2017-06-30 ENCOUNTER — Inpatient Hospital Stay (INDEPENDENT_AMBULATORY_CARE_PROVIDER_SITE_OTHER): Payer: 59 | Admitting: Family

## 2017-07-07 ENCOUNTER — Ambulatory Visit (INDEPENDENT_AMBULATORY_CARE_PROVIDER_SITE_OTHER): Payer: 59 | Admitting: Orthopedic Surgery

## 2017-07-07 ENCOUNTER — Ambulatory Visit (INDEPENDENT_AMBULATORY_CARE_PROVIDER_SITE_OTHER): Payer: 59 | Admitting: Family

## 2017-07-07 ENCOUNTER — Encounter (INDEPENDENT_AMBULATORY_CARE_PROVIDER_SITE_OTHER): Payer: Self-pay | Admitting: Family

## 2017-07-07 VITALS — Ht 62.0 in | Wt 170.0 lb

## 2017-07-07 DIAGNOSIS — M7502 Adhesive capsulitis of left shoulder: Secondary | ICD-10-CM

## 2017-07-07 DIAGNOSIS — M7542 Impingement syndrome of left shoulder: Secondary | ICD-10-CM

## 2017-07-07 NOTE — Progress Notes (Signed)
Post-Op Visit Note   Patient: Nichole Brown           Date of Birth: 1966/05/06           MRN: [id number] Visit Date: 07/07/2017 PCP: Mikey Kirschner, MD  Chief Complaint:  Chief Complaint  Patient presents with  . Left Shoulder - Routine Post Op    3 weeks left shoulder scope and debridement and decompression     HPI:  The patient is a 51 year old woman who presents today status post left shoulder scope with the compression debridement about 3 weeks ago. Him wonders when she may return to work. She states she has had improvement in her range of motion has been walking her arms up the wall she is able to reach above head and facet her bra. However she does continue to have pain with above head reaching and behind back reaching. She states that it hurts when she slowly lowers her arm. Continues to have some numbness down her biceps down to her forearm.    Left Shoulder Exam   Tenderness  The patient is experiencing tenderness in the biceps tendon.  Range of Motion  The patient has normal left shoulder ROM.  Tests  Impingement: negative  Comments:  Pain with drop arm       Visit Diagnoses:  1. Adhesive capsulitis of left shoulder   2. Impingement syndrome of left shoulder     Plan: will continue with Naproxen bid. Continue HEP. May return to work on 07/31/17. Note provided.  May return for Depomedrol injection if continued bicep tendon pain.  Follow-Up Instructions: Return in about 4 weeks (around 08/04/2017), or if symptoms worsen or fail to improve.   Imaging: No results found.  Orders:  No orders of the defined types were placed in this encounter.  No orders of the defined types were placed in this encounter.    PMFS History: Patient Active Problem List   Diagnosis Date Noted  . Impingement syndrome of left shoulder   . 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 Gastroesophageal reflux disease without esophagitis 12/26/2016  . Grief at  loss of child 11/09/2016  . Sternal fracture 10/06/2015  . Contusion of leg 10/06/2015  . Migraine headache 07/24/2013  . Insomnia 05/30/2013  . Fibromyalgia 05/30/2013   Past Medical History:  Diagnosis Date  . Anxiety   . Bronchitis   . Depression   . Fibromyalgia 2005  . GERD (gastroesophageal reflux disease)   . Insomnia   . Migraine headache   . Pneumonia     Family History  Problem Relation Age of Onset  . Hypertension Maternal Grandmother   . Diabetes Maternal Grandmother   . Heart disease Maternal Grandmother 40       MI  . Hypertension Maternal Grandfather   . Throat cancer Maternal Grandfather   . Breast cancer Mother 36  . Hypertension Father     Past Surgical History:  Procedure Laterality Date  . ABDOMINAL HYSTERECTOMY    . CESAREAN SECTION    . CHOLECYSTECTOMY  1988  . open heart surgery  1967  . PATENT DUCTUS ARTERIOUS REPAIR  1967  . SHOULDER ARTHROSCOPY Left 06/16/2017   Procedure: LEFT SHOULDER ARTHROSCOPY, DEBRIDEMENT, AND DECOMPRESSION;  Surgeon: Newt Minion, MD;  Location: [place];  Service: Orthopedics;  Laterality: Left;  . SHOULDER SURGERY Left 2008   [facility] joint   Social History   Occupational History  . Not on file.   Social  History Main Topics  . Smoking status: Never Smoker  . Smokeless tobacco: Never Used  . Alcohol use Yes     Comment: occassionally  . Drug use: No  . Sexual activity: Yes    Birth control/ protection: Surgical

## 2017-07-14 ENCOUNTER — Ambulatory Visit (INDEPENDENT_AMBULATORY_CARE_PROVIDER_SITE_OTHER): Payer: 59 | Admitting: Nurse Practitioner

## 2017-07-14 ENCOUNTER — Encounter: Payer: Self-pay | Admitting: Nurse Practitioner

## 2017-07-14 VITALS — BP 114/74 | Ht 62.0 in | Wt 171.0 lb

## 2017-07-14 DIAGNOSIS — G43009 Migraine without aura, not intractable, without status migrainosus: Secondary | ICD-10-CM | POA: Diagnosis not present

## 2017-07-14 NOTE — Progress Notes (Signed)
Subjective:  Presents for recheck on migraines for FMLA which runs out Monday. Continues follow up with headache specialist at [facility] [place]. Is getting ready to restart Topamax which worked very well for her in the past. No changes at this time in her migraine symptomatology or frequency. Has identified some triggers such as stress and weather change.   Objective:   BP 114/74   Ht 5\' 2"  (1.575 m)   Wt 171 lb 0.4 oz (77.6 kg)   BMI 31.28 kg/m  NAD. Alert, oriented. Cheerful affect. Lungs clear. Heart RRR.   Assessment:   Problem List Items Addressed This Visit      Cardiovascular and Mediastinum   Migraine headache - Primary       Plan:  Employer to send FMLA forms for [place] to complete. Continue follow at [facility] as planned. Recheck here as needed.

## 2017-07-17 ENCOUNTER — Ambulatory Visit (INDEPENDENT_AMBULATORY_CARE_PROVIDER_SITE_OTHER): Payer: 59 | Admitting: Family

## 2017-07-17 ENCOUNTER — Encounter (INDEPENDENT_AMBULATORY_CARE_PROVIDER_SITE_OTHER): Payer: Self-pay | Admitting: Family

## 2017-07-17 DIAGNOSIS — M7542 Impingement syndrome of left shoulder: Secondary | ICD-10-CM

## 2017-07-17 DIAGNOSIS — M25512 Pain in left shoulder: Secondary | ICD-10-CM | POA: Diagnosis not present

## 2017-07-17 DIAGNOSIS — M7502 Adhesive capsulitis of left shoulder: Secondary | ICD-10-CM

## 2017-07-17 MED ORDER — METHYLPREDNISOLONE ACETATE 40 MG/ML IJ SUSP
40.0000 mg | INTRAMUSCULAR | Status: AC | PRN
  Administered 2017-07-17: 40 mg via INTRA_ARTICULAR

## 2017-07-17 MED ORDER — LIDOCAINE HCL 1 % IJ SOLN
5.0000 mL | INTRAMUSCULAR | Status: AC | PRN
  Administered 2017-07-17: 5 mL

## 2017-07-17 NOTE — Progress Notes (Signed)
Post-Op Visit Note   Patient: Nichole Brown           Date of Birth: 1966/06/14           MRN: [id number] Visit Date: 07/17/2017 PCP: Mikey Kirschner, MD  Chief Complaint:  Chief Complaint  Patient presents with  . Left Shoulder - Follow-up    Left Shoulder Scope & Debridement and Decompression    HPI:  The patient is a 51 year old woman who presents today status post arthroscopy with debridement and decompression of the left shoulder. Has been working on home exercise program and has regained full range of motion however she continues to have anterior shoulder pain discomfort at night she is complaining of radiating pain down her arm all the way to her thumb occasional numbness.    Left Shoulder Exam   Tenderness  The patient is experiencing tenderness in the biceps tendon.  Range of Motion  The patient has normal left shoulder ROM.  Tests  Impingement: negative  Other  Erythema: absent Scars: present Pulse: present   Comments:  Portals well healed.       Visit Diagnoses:  1. Adhesive capsulitis of left shoulder   2. Impingement syndrome of left shoulder     Plan: continue HEP. Provided a return to work note for 07/27/17. depomedrol injection left shoulder today. Continue nsaids as needed.   Follow-Up Instructions: Return in about 4 weeks (around 08/14/2017).   Imaging: No results found.  Orders:  No orders of the defined types were placed in this encounter.  No orders of the defined types were placed in this encounter.    PMFS History: Patient Active Problem List   Diagnosis Date Noted  . Impingement syndrome of left shoulder   . 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 Gastroesophageal reflux disease without esophagitis 12/26/2016  . Grief at loss of child 11/09/2016  . Sternal fracture 10/06/2015  . Contusion of leg 10/06/2015  . Migraine headache 07/24/2013  . Insomnia 05/30/2013  . Fibromyalgia 05/30/2013   Past Medical  History:  Diagnosis Date  . Anxiety   . Bronchitis   . Depression   . Fibromyalgia 2005  . GERD (gastroesophageal reflux disease)   . Insomnia   . Migraine headache   . Pneumonia     Family History  Problem Relation Age of Onset  . Hypertension Maternal Grandmother   . Diabetes Maternal Grandmother   . Heart disease Maternal Grandmother 40       MI  . Hypertension Maternal Grandfather   . Throat cancer Maternal Grandfather   . Breast cancer Mother 68  . Hypertension Father     Past Surgical History:  Procedure Laterality Date  . ABDOMINAL HYSTERECTOMY    . CESAREAN SECTION    . CHOLECYSTECTOMY  1988  . open heart surgery  1967  . PATENT DUCTUS ARTERIOUS REPAIR  1967  . SHOULDER ARTHROSCOPY Left 06/16/2017   Procedure: LEFT SHOULDER ARTHROSCOPY, DEBRIDEMENT, AND DECOMPRESSION;  Surgeon: Newt Minion, MD;  Location: [place];  Service: Orthopedics;  Laterality: Left;  . SHOULDER SURGERY Left 2008   [facility] joint   Social History   Occupational History  . Not on file.   Social History Main Topics  . Smoking status: Never Smoker  . Smokeless tobacco: Never Used  . Alcohol use Yes     Comment: occassionally  . Drug use: No  . Sexual activity: Yes    Birth control/ protection: Surgical

## 2017-07-17 NOTE — Progress Notes (Signed)
   Procedure Note  Patient: Nichole Brown             Date of Birth: 1966/04/16           MRN: [id number]             Visit Date: 07/17/2017  Procedures: Visit Diagnoses: Adhesive capsulitis of left shoulder  Impingement syndrome of left shoulder  Large Joint Inj Date/Time: 07/17/2017 1:27 PM Performed by: Suzan Slick Authorized by: Dondra Prader R   Consent Given by:  Patient Site marked: the procedure site was marked   Timeout: prior to procedure the correct patient, procedure, and site was verified   Indications:  Pain and diagnostic evaluation Location:  Shoulder Site:  L subacromial bursa Prep: patient was prepped and draped in usual sterile fashion   Needle Size:  22 G Needle Length:  1.5 inches Ultrasound Guidance: No   Fluoroscopic Guidance: No   Arthrogram: No   Medications:  5 mL lidocaine 1 %; 40 mg methylPREDNISolone acetate 40 MG/ML Aspiration Attempted: No   Patient tolerance:  Patient tolerated the procedure well with no immediate complications

## 2017-07-18 ENCOUNTER — Encounter: Payer: Self-pay | Admitting: Cardiovascular Disease

## 2017-07-19 DIAGNOSIS — M5384 Other specified dorsopathies, thoracic region: Secondary | ICD-10-CM | POA: Diagnosis not present

## 2017-07-19 DIAGNOSIS — M9902 Segmental and somatic dysfunction of thoracic region: Secondary | ICD-10-CM | POA: Diagnosis not present

## 2017-07-19 DIAGNOSIS — M9901 Segmental and somatic dysfunction of cervical region: Secondary | ICD-10-CM | POA: Diagnosis not present

## 2017-07-19 DIAGNOSIS — M50122 Cervical disc disorder at C5-C6 level with radiculopathy: Secondary | ICD-10-CM | POA: Diagnosis not present

## 2017-07-20 DIAGNOSIS — M50122 Cervical disc disorder at C5-C6 level with radiculopathy: Secondary | ICD-10-CM | POA: Diagnosis not present

## 2017-07-20 DIAGNOSIS — M5384 Other specified dorsopathies, thoracic region: Secondary | ICD-10-CM | POA: Diagnosis not present

## 2017-07-20 DIAGNOSIS — M9902 Segmental and somatic dysfunction of thoracic region: Secondary | ICD-10-CM | POA: Diagnosis not present

## 2017-07-20 DIAGNOSIS — M9901 Segmental and somatic dysfunction of cervical region: Secondary | ICD-10-CM | POA: Diagnosis not present

## 2017-07-21 ENCOUNTER — Encounter (INDEPENDENT_AMBULATORY_CARE_PROVIDER_SITE_OTHER): Payer: Self-pay | Admitting: Family

## 2017-07-21 ENCOUNTER — Ambulatory Visit (INDEPENDENT_AMBULATORY_CARE_PROVIDER_SITE_OTHER): Payer: 59 | Admitting: Family

## 2017-07-21 VITALS — Ht 62.0 in | Wt 171.0 lb

## 2017-07-21 DIAGNOSIS — M5384 Other specified dorsopathies, thoracic region: Secondary | ICD-10-CM | POA: Diagnosis not present

## 2017-07-21 DIAGNOSIS — M7542 Impingement syndrome of left shoulder: Secondary | ICD-10-CM

## 2017-07-21 DIAGNOSIS — M9901 Segmental and somatic dysfunction of cervical region: Secondary | ICD-10-CM | POA: Diagnosis not present

## 2017-07-21 DIAGNOSIS — M9902 Segmental and somatic dysfunction of thoracic region: Secondary | ICD-10-CM | POA: Diagnosis not present

## 2017-07-21 DIAGNOSIS — M50122 Cervical disc disorder at C5-C6 level with radiculopathy: Secondary | ICD-10-CM | POA: Diagnosis not present

## 2017-07-24 ENCOUNTER — Telehealth (HOSPITAL_COMMUNITY): Payer: Self-pay | Admitting: Physical Therapy

## 2017-07-24 ENCOUNTER — Other Ambulatory Visit (INDEPENDENT_AMBULATORY_CARE_PROVIDER_SITE_OTHER): Payer: Self-pay | Admitting: Family

## 2017-07-24 DIAGNOSIS — M5384 Other specified dorsopathies, thoracic region: Secondary | ICD-10-CM | POA: Diagnosis not present

## 2017-07-24 DIAGNOSIS — M25512 Pain in left shoulder: Principal | ICD-10-CM

## 2017-07-24 DIAGNOSIS — M50122 Cervical disc disorder at C5-C6 level with radiculopathy: Secondary | ICD-10-CM | POA: Diagnosis not present

## 2017-07-24 DIAGNOSIS — G8929 Other chronic pain: Secondary | ICD-10-CM

## 2017-07-24 DIAGNOSIS — M9902 Segmental and somatic dysfunction of thoracic region: Secondary | ICD-10-CM | POA: Diagnosis not present

## 2017-07-24 DIAGNOSIS — M9901 Segmental and somatic dysfunction of cervical region: Secondary | ICD-10-CM | POA: Diagnosis not present

## 2017-07-24 NOTE — Progress Notes (Signed)
ref

## 2017-07-24 NOTE — Telephone Encounter (Signed)
Hinton Dyer requested that I cx this apptment since we have the patient scheduled for Tuesday and she lives closure to our office. NF

## 2017-07-24 NOTE — Progress Notes (Signed)
Post-Op Visit Note   Patient: Nichole Brown           Date of Birth: 02/18/1966           MRN: [id number] Visit Date: 07/21/2017 PCP: Mikey Kirschner, MD  Chief Complaint:  Chief Complaint  Patient presents with  . Left Shoulder - Follow-up    Work in today s/p shoulder scope, deb, decomp 06/16/17 and cortisone injection at last visit.     HPI:  The patient is a 51 year old woman who presents today status post arthroscopy for left shoulder impingement and adhesive capsulitis. This was on July 6. She continues to complain of numbness down her biceps this occasionally radiates to her hand. States that her hand occasionally feels cold she has anterior shoulder pain she is pleased with her improvement in range of motion however continues to have pain. This is not associated with any sort of activity or movement.    Ortho Exam on examination the portals are well-healed there is no erythema no swelling. her shoulder range of motion is fluid and improved, extension to 180. Distal neurovascular intact. Grip strength 4/5 on left, 5/5 on right. Hand normothermic.   C spine nontender. Trapezius tenderness on left. Painless rom. spurlings negative.   Visit Diagnoses:  1. Impingement syndrome of left shoulder     Plan: will proceed with PT for left shoulder. Have provided an out of work for 3 more weeks. Will reevalute return to work at that time.   Follow-Up Instructions: Return in about 3 weeks (around 08/11/2017).   Imaging: No results found.  Orders:  Orders Placed This Encounter  Procedures  . Ambulatory referral to Physical Therapy   No orders of the defined types were placed in this encounter.    PMFS History: Patient Active Problem List   Diagnosis Date Noted  . Impingement syndrome of left shoulder   . 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 Gastroesophageal reflux disease without esophagitis 12/26/2016  . Grief at loss of child 11/09/2016  . Sternal  fracture 10/06/2015  . Contusion of leg 10/06/2015  . Migraine headache 07/24/2013  . Insomnia 05/30/2013  . Fibromyalgia 05/30/2013   Past Medical History:  Diagnosis Date  . Anxiety   . Bronchitis   . Depression   . Fibromyalgia 2005  . GERD (gastroesophageal reflux disease)   . Insomnia   . Migraine headache   . Pneumonia     Family History  Problem Relation Age of Onset  . Hypertension Maternal Grandmother   . Diabetes Maternal Grandmother   . Heart disease Maternal Grandmother 40       MI  . Hypertension Maternal Grandfather   . Throat cancer Maternal Grandfather   . Breast cancer Mother 62  . Hypertension Father     Past Surgical History:  Procedure Laterality Date  . ABDOMINAL HYSTERECTOMY    . CESAREAN SECTION    . CHOLECYSTECTOMY  1988  . open heart surgery  1967  . PATENT DUCTUS ARTERIOUS REPAIR  1967  . SHOULDER ARTHROSCOPY Left 06/16/2017   Procedure: LEFT SHOULDER ARTHROSCOPY, DEBRIDEMENT, AND DECOMPRESSION;  Surgeon: Newt Minion, MD;  Location: [place];  Service: Orthopedics;  Laterality: Left;  . SHOULDER SURGERY Left 2008   [facility]   Social History   Occupational History  . Not on file.   Social History Main Topics  . Smoking status: Never Smoker  . Smokeless tobacco: Never Used  . Alcohol use Yes  Comment: occassionally  . Drug use: No  . Sexual activity: Yes    Birth control/ protection: Surgical

## 2017-07-25 ENCOUNTER — Ambulatory Visit: Payer: 59

## 2017-07-25 DIAGNOSIS — M9902 Segmental and somatic dysfunction of thoracic region: Secondary | ICD-10-CM | POA: Diagnosis not present

## 2017-07-25 DIAGNOSIS — M9901 Segmental and somatic dysfunction of cervical region: Secondary | ICD-10-CM | POA: Diagnosis not present

## 2017-07-25 DIAGNOSIS — M50122 Cervical disc disorder at C5-C6 level with radiculopathy: Secondary | ICD-10-CM | POA: Diagnosis not present

## 2017-07-25 DIAGNOSIS — M5384 Other specified dorsopathies, thoracic region: Secondary | ICD-10-CM | POA: Diagnosis not present

## 2017-07-26 ENCOUNTER — Encounter (HOSPITAL_COMMUNITY): Payer: Self-pay | Admitting: Occupational Therapy

## 2017-07-26 ENCOUNTER — Ambulatory Visit (HOSPITAL_COMMUNITY): Payer: 59 | Attending: Family | Admitting: Occupational Therapy

## 2017-07-26 DIAGNOSIS — M25612 Stiffness of left shoulder, not elsewhere classified: Secondary | ICD-10-CM | POA: Insufficient documentation

## 2017-07-26 DIAGNOSIS — R29898 Other symptoms and signs involving the musculoskeletal system: Secondary | ICD-10-CM | POA: Diagnosis not present

## 2017-07-26 DIAGNOSIS — M25512 Pain in left shoulder: Secondary | ICD-10-CM | POA: Diagnosis not present

## 2017-07-26 NOTE — Therapy (Signed)
[place], [place], [address] Phone: [phone]   Fax:  [phone]  Occupational Therapy Evaluation  Patient Details  Name: Nichole Brown MRN: [id number] Date of Birth: 01-16-1966 Referring Provider: Dondra Prader, NP  Encounter Date: 07/26/2017      OT End of Session - 07/26/17 1123    Visit Number 1   Number of Visits 16   Date for OT Re-Evaluation 09/24/17  Mini-reassessment 08/24/2017   Authorization Type UMR; $20 copay   OT Start Time 1032   OT Stop Time 1114   OT Time Calculation (min) 42 min   Activity Tolerance Patient tolerated treatment well   Behavior During Therapy [facility] for tasks assessed/performed      Past Medical History:  Diagnosis Date  . Anxiety   . Bronchitis   . Depression   . Fibromyalgia 2005  . GERD (gastroesophageal reflux disease)   . Insomnia   . Migraine headache   . Pneumonia     Past Surgical History:  Procedure Laterality Date  . ABDOMINAL HYSTERECTOMY    . CESAREAN SECTION    . CHOLECYSTECTOMY  1988  . open heart surgery  1967  . PATENT DUCTUS ARTERIOUS REPAIR  1967  . SHOULDER ARTHROSCOPY Left 06/16/2017   Procedure: LEFT SHOULDER ARTHROSCOPY, DEBRIDEMENT, AND DECOMPRESSION;  Surgeon: Newt Minion, MD;  Location: [place];  Service: Orthopedics;  Laterality: Left;  . SHOULDER SURGERY Left 2008   AC joint    There were no vitals filed for this visit.      Subjective Assessment - 07/26/17 1123    Subjective  S: My arm goes numb and I have shooting pains down to my hand sometimes.    Pertinent History Pt is a 51 y/o female s/p left shoulder arthroscopy on 06/16/17 presenting with continued pain limiting LUE functional use. Pt had a cortizone shot on 07/17/17 which she reports has not helped.  Pt was referred to occupational therapy for evaluation and treatment by Dondra Prader, NP.    Special Tests FOTO Score: 32/100; 68% impairment   Patient Stated Goals To have less  pain and be able to use her left arm.    Currently in Pain? Yes   Pain Score 8    Pain Location Shoulder   Pain Orientation Left   Pain Descriptors / Indicators Aching;Numbness;Sharp   Pain Type Acute pain   Pain Radiating Towards elbow and hand   Pain Onset More than a month ago   Pain Frequency Constant   Aggravating Factors  movement   Pain Relieving Factors heat, pain medications   Effect of Pain on Daily Activities severe effect on B/IADL completion   Multiple Pain Sites No           [facility] OT Assessment - 07/26/17 1027      Assessment   Diagnosis S/P Left shoulder arthroscopy-debridement and decompression   Referring Provider Dondra Prader, NP   Onset Date 06/16/17  shoulder pain is chronic   Prior Therapy None     Precautions   Precautions None     Restrictions   Weight Bearing Restrictions No     Balance Screen   Has the patient fallen in the past 6 months No   Has the patient had a decrease in activity level because of a fear of falling?  No   Is the patient reluctant to leave their home because of a fear of falling?  No  Prior Function   Level of Independence Independent   Vocation Full time employment   Vocation Requirements reaching, pulling, lifting, drawing blood, pushing a cart   Leisure playing with grandsons-79, 82, 61 years old     ADL   ADL comments Pt is having difficulty with driving, gripping and holding objects, sleeping, dressing-donning shirt, bra, washing hair, household chores     Written Expression   Dominant Hand Right     Cognition   Overall Cognitive Status Within Functional Limits for tasks assessed     Sensation   Light Touch Impaired Detail   Light Touch Impaired Details Impaired LUE   Semmes Weinstein Monofilament Scale Diminshed Protective  forearm along radial and median nerves     ROM / Strength   AROM / PROM / Strength AROM;PROM;Strength     Palpation   Palpation comment Mod fascial restrictions along left upper arm,  trapezius, and scapularis regions     AROM   Overall AROM Comments Assessed seated, er/IR adducted   AROM Assessment Site Shoulder   Right/Left Shoulder Left   Left Shoulder Flexion 102 Degrees   Left Shoulder ABduction 102 Degrees   Left Shoulder Internal Rotation 90 Degrees   Left Shoulder External Rotation 50 Degrees     PROM   Overall PROM Comments Assessed supine, er/IR adducted   PROM Assessment Site Shoulder   Right/Left Shoulder Left   Left Shoulder Flexion 120 Degrees   Left Shoulder ABduction 119 Degrees   Left Shoulder Internal Rotation 90 Degrees   Left Shoulder External Rotation 39 Degrees     Strength   Overall Strength Comments Assessed seated, er/IR adducted   Strength Assessment Site Shoulder   Right/Left Shoulder Left   Left Shoulder Flexion 3-/5   Left Shoulder ABduction 3-/5   Left Shoulder Internal Rotation 3/5   Left Shoulder External Rotation 3-/5                         OT Education - 07/26/17 1102    Education provided Yes   Education Details table slides   Person(s) Educated Patient   Methods Explanation;Demonstration;Handout   Comprehension Verbalized understanding;Returned demonstration          OT Short Term Goals - 07/26/17 1133      OT SHORT TERM GOAL #1   Title Pt will be educated on and independent in HEP to improve functional use of LUE during daily tasks.    Time 4   Period Weeks   Status New   Target Date 08/23/17     OT SHORT TERM GOAL #2   Title Pt will improve LUE P/ROM to WNL to increase ability to use LUE as assist during dressing tasks.    Time 4   Period Weeks   Status New     OT SHORT TERM GOAL #3   Title Pt will decrease pain in LUE to 5/10 to improve ability to sleep at night.    Time 4   Period Weeks   Status New     OT SHORT TERM GOAL #4   Title Pt will improve LUE strength to 4-/5 to increase ability to use LUE as assist when driving.    Time 4   Period Weeks   Status New            OT Long Term Goals - 07/26/17 1136      OT LONG TERM GOAL #1   Title Pt will  return to highest level of functioning during B/IADL tasks using LUE as non-dominant.    Time 8   Period Weeks   Status New   Target Date 09/24/17     OT LONG TERM GOAL #2   Title Pt will decrease LUE pain to 3/10 or less to improve ability to use LUE during grooming tasks such as washing hair.    Time 8   Period Weeks   Status New     OT LONG TERM GOAL #3   Title Pt will decrease fascial restrictions in LUE from mod/max to minimal amounts to improve mobility required for functional reaching tasks.    Time 8   Period Weeks   Status New     OT LONG TERM GOAL #4   Title Pt will improve LUE A/ROM to [facility] to increase ability to perform overhead reaching tasks at home and work.    Time 8   Period Weeks   Status New     OT LONG TERM GOAL #5   Title Pt will improve LUE strength to 4+/5 to increase ability to pull/push heavy cart at work.    Time 8   Period Weeks   Status New               Plan - 07/26/17 1128    Clinical Impression Statement A: Pt is a 51 y/o female s/p left shoulder arthroscopy on 06/16/17 presenting with continued pain and numbness of LUE. Pt reports nothing really helps like heat or ice and movement exacerbates pain. Provided pt with table slide HEP this session, pt demonstrates good form and understanding.    Occupational Profile and client history currently impacting functional performance Pt is limited in B/IADL tasks due to functional deficits in LUE use. 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    Occupational performance deficits (Please refer to evaluation for details): ADL's;IADL's;Rest and Sleep;Work;Leisure;Social Participation   Rehab Potential Good   OT Frequency 2x / week   OT Duration 8 weeks   OT Treatment/Interventions Self-care/ADL training;Therapeutic exercise;Patient/family education;Ultrasound;Manual  Therapy;Therapeutic activities;Cryotherapy;Electrical Stimulation;Passive range of motion;Moist Heat   Plan P: Pt will benefit from skilled OT services to decrease pain and fascial restrictions, and improve ROM, strength, and functional use of LUE during B/IADL completion. Treatment plan: myofascial release, manual therapy, P/ROM, AA/ROM, A/ROM, scapular mobility and strengthening, general LUE strengthening, modalities prn. Next session: measure grip/pinch strength   Clinical Decision Making Limited treatment options, no task modification necessary   OT Home Exercise Plan 8/15: table slides   Consulted and Agree with Plan of Care Patient      Patient will benefit from skilled therapeutic intervention in order to improve the following deficits and impairments:  Decreased strength, Decreased activity tolerance, Impaired flexibility, Impaired sensation, Pain, Decreased range of motion, Increased fascial restricitons, Impaired UE functional use  Visit Diagnosis: Acute pain of left shoulder  Stiffness of left shoulder, not elsewhere classified  Other symptoms and signs involving the musculoskeletal system    Problem List Patient Active Problem List   Diagnosis Date Noted  . Impingement syndrome of left shoulder   . 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 Gastroesophageal reflux disease without esophagitis 12/26/2016  . Grief at loss of child 11/09/2016  . Sternal fracture 10/06/2015  . Contusion of leg 10/06/2015  . Migraine headache 07/24/2013  . Insomnia 05/30/2013  . Fibromyalgia 05/30/2013   Guadelupe Sabin, OTR/L  [phone] 07/26/2017, 11:42  [facility] [address] [place], [place], [address] Phone: [phone]   Fax:  [phone]  Name: JETTE LEWAN MRN: [id number] Date of Birth: 09-Aug-1966

## 2017-07-26 NOTE — Patient Instructions (Signed)
SHOULDER: Flexion On Table   Place hands on table, elbows straight. Move hips away from body. Press hands down into table. __15_ reps per set, _2-3__ sets per day  Abduction (Passive)   With arm out to side, resting on table, lower head toward arm, keeping trunk away from table.  Repeat __15__ times. Do __2-3__ sessions per day.  Copyright  VHI. All rights reserved.     Internal Rotation (Assistive)   Seated with elbow bent at right angle and held against side, slide arm on table surface in an inward arc. Repeat __15__ times. Do __2-3__ sessions per day. Activity: Use this motion to brush crumbs off the table.  Copyright  VHI. All rights reserved.    

## 2017-07-27 DIAGNOSIS — M9901 Segmental and somatic dysfunction of cervical region: Secondary | ICD-10-CM | POA: Diagnosis not present

## 2017-07-27 DIAGNOSIS — M9902 Segmental and somatic dysfunction of thoracic region: Secondary | ICD-10-CM | POA: Diagnosis not present

## 2017-07-27 DIAGNOSIS — M5384 Other specified dorsopathies, thoracic region: Secondary | ICD-10-CM | POA: Diagnosis not present

## 2017-07-27 DIAGNOSIS — M50122 Cervical disc disorder at C5-C6 level with radiculopathy: Secondary | ICD-10-CM | POA: Diagnosis not present

## 2017-07-28 ENCOUNTER — Encounter (HOSPITAL_COMMUNITY): Payer: Self-pay

## 2017-07-28 ENCOUNTER — Ambulatory Visit (HOSPITAL_COMMUNITY): Payer: 59

## 2017-07-28 DIAGNOSIS — M25512 Pain in left shoulder: Secondary | ICD-10-CM | POA: Diagnosis not present

## 2017-07-28 DIAGNOSIS — R29898 Other symptoms and signs involving the musculoskeletal system: Secondary | ICD-10-CM

## 2017-07-28 DIAGNOSIS — M25612 Stiffness of left shoulder, not elsewhere classified: Secondary | ICD-10-CM | POA: Diagnosis not present

## 2017-07-28 NOTE — Therapy (Signed)
[place], [place], [address] Phone: [phone]   Fax:  [phone]  Occupational Therapy Treatment  Patient Details  Name: Nichole Brown MRN: [id number] Date of Birth: 07-28-1966 Referring Provider: Dondra Prader, NP  Encounter Date: 07/28/2017      OT End of Session - 07/28/17 0944    Visit Number 2   Number of Visits 16   Date for OT Re-Evaluation 09/24/17  Mini-reassessment 08/24/2017   Authorization Type UMR; $20 copay   OT Start Time 0905   OT Stop Time 0945   OT Time Calculation (min) 40 min   Activity Tolerance Patient tolerated treatment well   Behavior During Therapy [facility] for tasks assessed/performed      Past Medical History:  Diagnosis Date  . Anxiety   . Bronchitis   . Depression   . Fibromyalgia 2005  . GERD (gastroesophageal reflux disease)   . Insomnia   . Migraine headache   . Pneumonia     Past Surgical History:  Procedure Laterality Date  . ABDOMINAL HYSTERECTOMY    . CESAREAN SECTION    . CHOLECYSTECTOMY  1988  . open heart surgery  1967  . PATENT DUCTUS ARTERIOUS REPAIR  1967  . SHOULDER ARTHROSCOPY Left 06/16/2017   Procedure: LEFT SHOULDER ARTHROSCOPY, DEBRIDEMENT, AND DECOMPRESSION;  Surgeon: Newt Minion, MD;  Location: [place];  Service: Orthopedics;  Laterality: Left;  . SHOULDER SURGERY Left 2008   AC joint    There were no vitals filed for this visit.      Subjective Assessment - 07/28/17 0930    Subjective  S: My 2 y/o grandson grabbed my arm the other day and it really hurt.   Currently in Pain? Yes   Pain Score 8    Pain Location Shoulder   Pain Orientation Left   Pain Descriptors / Indicators Aching;Numbness;Sharp   Pain Type Acute pain            [facility] OT Assessment - 07/28/17 0932      Assessment   Diagnosis S/P Left shoulder arthroscopy-debridement and decompression     Precautions   Precautions None                  OT  Treatments/Exercises (OP) - 07/28/17 0933      Exercises   Exercises Shoulder     Shoulder Exercises: Supine   Protraction PROM;10 reps   Horizontal ABduction PROM;10 reps   External Rotation PROM;10 reps   Internal Rotation PROM;10 reps   Flexion PROM;10 reps   ABduction PROM;10 reps   Other Supine Exercises Bridges; 10X     Shoulder Exercises: Therapy Ball   Flexion 10 reps   ABduction 10 reps     Shoulder Exercises: ROM/Strengthening   Thumb Tacks 1'   Prot/Ret//Elev/Dep 1'     Manual Therapy   Manual Therapy Myofascial release   Manual therapy comments Manual therapy completed prior to exercises.   Myofascial Release Myofascial release and manual stretching completed to left upper arm, trapezius, and scapularis region to decrease fascial restrictions and increase ROM in a pain free zone.                 OT Education - 07/28/17 1015    Education provided Yes   Education Details Patient was given OT evaluation print out and reviewed goals and plan of care.    Person(s) Educated Patient   Methods Explanation;Handout   Comprehension Verbalized  understanding          OT Short Term Goals - 07/28/17 0939      OT SHORT TERM GOAL #1   Title Pt will be educated on and independent in HEP to improve functional use of LUE during daily tasks.    Time 4   Period Weeks   Status On-going     OT SHORT TERM GOAL #2   Title Pt will improve LUE P/ROM to WNL to increase ability to use LUE as assist during dressing tasks.    Time 4   Period Weeks   Status On-going     OT SHORT TERM GOAL #3   Title Pt will decrease pain in LUE to 5/10 to improve ability to sleep at night.    Time 4   Period Weeks   Status On-going     OT SHORT TERM GOAL #4   Title Pt will improve LUE strength to 4-/5 to increase ability to use LUE as assist when driving.    Time 4   Period Weeks   Status On-going           OT Long Term Goals - 07/28/17 0939      OT LONG TERM GOAL #1    Title Pt will return to highest level of functioning during B/IADL tasks using LUE as non-dominant.    Time 8   Period Weeks   Status On-going     OT LONG TERM GOAL #2   Title Pt will decrease LUE pain to 3/10 or less to improve ability to use LUE during grooming tasks such as washing hair.    Time 8   Period Weeks   Status On-going     OT LONG TERM GOAL #3   Title Pt will decrease fascial restrictions in LUE from mod/max to minimal amounts to improve mobility required for functional reaching tasks.    Time 8   Period Weeks   Status On-going     OT LONG TERM GOAL #4   Title Pt will improve LUE A/ROM to [facility] to increase ability to perform overhead reaching tasks at home and work.    Time 8   Period Weeks   Status On-going     OT LONG TERM GOAL #5   Title Pt will improve LUE strength to 4+/5 to increase ability to pull/push heavy cart at work.    Time 8   Period Weeks   Status On-going               Plan - 07/28/17 1015    Clinical Impression Statement A: Initiated myofascial release, manual stretching bridging, and therapy ball stretches. Discussed task modifications and what activities to complete with her LUE and which ones to refrain from for now. Patient required VC for form and technique. Pt presents with numbness in entire LUE and tenderness at shoulder.    Plan P: Add isometrics. Measure grip and pinch strength.      Patient will benefit from skilled therapeutic intervention in order to improve the following deficits and impairments:  Decreased strength, Decreased activity tolerance, Impaired flexibility, Impaired sensation, Pain, Decreased range of motion, Increased fascial restricitons, Impaired UE functional use  Visit Diagnosis: Acute pain of left shoulder  Stiffness of left shoulder, not elsewhere classified  Other symptoms and signs involving the musculoskeletal system    Problem List Patient Active Problem List   Diagnosis Date Noted  .  Impingement syndrome of left shoulder   . Adhesive  capsulitis of left shoulder 06/01/2017  . Gastroesophageal reflux disease without esophagitis 12/26/2016  . Grief at loss of child 11/09/2016  . Sternal fracture 10/06/2015  . Contusion of leg 10/06/2015  . Migraine headache 07/24/2013  . Insomnia 05/30/2013  . Fibromyalgia 05/30/2013   Ailene Ravel, OTR/L,CBIS  [phone]  07/28/2017, 10:20 AM  [facility] [address] [place], [place], [address] Phone: [phone]   Fax:  [phone]  Name: Nichole Brown MRN: [id number] Date of Birth: 16-Oct-1966

## 2017-07-31 ENCOUNTER — Encounter (HOSPITAL_COMMUNITY): Payer: Self-pay | Admitting: Specialist

## 2017-07-31 ENCOUNTER — Ambulatory Visit (HOSPITAL_COMMUNITY): Payer: 59 | Admitting: Specialist

## 2017-07-31 DIAGNOSIS — M50122 Cervical disc disorder at C5-C6 level with radiculopathy: Secondary | ICD-10-CM | POA: Diagnosis not present

## 2017-07-31 DIAGNOSIS — M9902 Segmental and somatic dysfunction of thoracic region: Secondary | ICD-10-CM | POA: Diagnosis not present

## 2017-07-31 DIAGNOSIS — M9901 Segmental and somatic dysfunction of cervical region: Secondary | ICD-10-CM | POA: Diagnosis not present

## 2017-07-31 DIAGNOSIS — M25512 Pain in left shoulder: Secondary | ICD-10-CM | POA: Diagnosis not present

## 2017-07-31 DIAGNOSIS — R29898 Other symptoms and signs involving the musculoskeletal system: Secondary | ICD-10-CM

## 2017-07-31 DIAGNOSIS — M25612 Stiffness of left shoulder, not elsewhere classified: Secondary | ICD-10-CM

## 2017-07-31 DIAGNOSIS — M5384 Other specified dorsopathies, thoracic region: Secondary | ICD-10-CM | POA: Diagnosis not present

## 2017-07-31 NOTE — Therapy (Signed)
[place], [place], [address] Phone: [phone]   Fax:  [phone]  Occupational Therapy Treatment  Patient Details  Name: Nichole Brown MRN: [id number] Date of Birth: Mar 02, 1966 Referring Provider: Dondra Prader, NP  Encounter Date: 07/31/2017      OT End of Session - 07/31/17 0938    Visit Number 3   Number of Visits 16   Date for OT Re-Evaluation 09/24/17  mini reassess on 08/24/17   Authorization Type UMR; $20 copay   OT Start Time 0905   OT Stop Time 0944   OT Time Calculation (min) 39 min   Activity Tolerance Patient tolerated treatment well   Behavior During Therapy [facility] for tasks assessed/performed      Past Medical History:  Diagnosis Date  . Anxiety   . Bronchitis   . Depression   . Fibromyalgia 2005  . GERD (gastroesophageal reflux disease)   . Insomnia   . Migraine headache   . Pneumonia     Past Surgical History:  Procedure Laterality Date  . ABDOMINAL HYSTERECTOMY    . CESAREAN SECTION    . CHOLECYSTECTOMY  1988  . open heart surgery  1967  . PATENT DUCTUS ARTERIOUS REPAIR  1967  . SHOULDER ARTHROSCOPY Left 06/16/2017   Procedure: LEFT SHOULDER ARTHROSCOPY, DEBRIDEMENT, AND DECOMPRESSION;  Surgeon: Newt Minion, MD;  Location: [place];  Service: Orthopedics;  Laterality: Left;  . SHOULDER SURGERY Left 2008   AC joint    There were no vitals filed for this visit.      Subjective Assessment - 07/31/17 0906    Subjective  S;  It started popping yesterday.  I was just reaching for something.    Currently in Pain? Yes   Pain Score 6    Pain Location Shoulder   Pain Orientation Left   Pain Descriptors / Indicators Aching;Numbness;Sharp   Pain Type Acute pain            [facility] OT Assessment - 07/31/17 0001      Assessment   Diagnosis S/P Left shoulder arthroscopy-debridement and decompression     Precautions   Precautions None     Restrictions   Weight Bearing  Restrictions No     Hand Function   Right Hand Grip (lbs) 62   Right Hand Lateral Pinch 10 lbs   Right Hand 3 Point Pinch 9 lbs   Left Hand Grip (lbs) 20   Left Hand Lateral Pinch 3 lbs   Left 3 point pinch 3 lbs                  OT Treatments/Exercises (OP) - 07/31/17 0001      Exercises   Exercises Shoulder     Shoulder Exercises: Supine   Protraction PROM;AAROM;10 reps   Horizontal ABduction PROM;AAROM;10 reps   External Rotation PROM;AAROM;10 reps   Internal Rotation PROM;AAROM;10 reps   Flexion PROM;AAROM;10 reps   ABduction PROM;AAROM;10 reps   Other Supine Exercises serratus anterior punch with therapist unweighting arm 10 times      Shoulder Exercises: Seated   Elevation AROM;12 reps   Extension AROM;12 reps   Retraction AROM;12 reps   Other Seated Exercises A/ROM elbow flexion, extension, supination, pronation, wrist flexion and extension 10 times      Shoulder Exercises: Therapy Ball   Flexion 15 reps   ABduction 15 reps     Shoulder Exercises: ROM/Strengthening   Thumb Tacks 1'   Prot/Ret//Elev/Dep  1'     Shoulder Exercises: Isometric Strengthening   Flexion Supine;3X3"   Extension Supine;3X3"   External Rotation Supine;3X3"   Internal Rotation Supine;3X3"   ABduction Supine;3X3"   ADduction Supine;3X3"     Manual Therapy   Manual Therapy Myofascial release   Manual therapy comments Manual therapy completed prior to exercises.   Myofascial Release Myofascial release and manual stretching completed to left upper arm, trapezius, and scapularis region to decrease fascial restrictions and increase ROM in a pain free zone.                   OT Short Term Goals - 07/31/17 0951      OT SHORT TERM GOAL #1   Title Pt will be educated on and independent in HEP to improve functional use of LUE during daily tasks.    Time 4   Period Weeks   Status On-going     OT SHORT TERM GOAL #2   Title Pt will improve LUE P/ROM to WNL to increase  ability to use LUE as assist during dressing tasks.    Time 4   Period Weeks   Status On-going     OT SHORT TERM GOAL #3   Title Pt will decrease pain in LUE to 5/10 to improve ability to sleep at night.    Time 4   Period Weeks   Status On-going     OT SHORT TERM GOAL #4   Title Pt will improve LUE strength to 4-/5 to increase ability to use LUE as assist when driving.    Time 4   Period Weeks   Status On-going     OT SHORT TERM GOAL #5   Title Patient will increase left grip strength by 20 pounds and pinch strength by 3 pounds for improved ability to maintain grasp on items when cooking.     Time 4   Period Weeks   Target Date 08/28/17           OT Long Term Goals - 07/31/17 0952      OT LONG TERM GOAL #1   Title Pt will return to highest level of functioning during B/IADL tasks using LUE as non-dominant.    Time 8   Period Weeks   Status On-going     OT LONG TERM GOAL #2   Title Pt will decrease LUE pain to 3/10 or less to improve ability to use LUE during grooming tasks such as washing hair.    Time 8   Period Weeks   Status On-going     OT LONG TERM GOAL #3   Title Pt will decrease fascial restrictions in LUE from mod/max to minimal amounts to improve mobility required for functional reaching tasks.    Time 8   Period Weeks   Status On-going     OT LONG TERM GOAL #4   Title Pt will improve LUE A/ROM to [facility] to increase ability to perform overhead reaching tasks at home and work.    Time 8   Period Weeks   Status On-going     OT LONG TERM GOAL #5   Title Pt will improve LUE strength to 4+/5 to increase ability to pull/push heavy cart at work.    Time 8   Period Weeks   Status On-going     Long Term Additional Goals   Additional Long Term Goals Yes     OT LONG TERM GOAL #6   Title Patient will improve  left grip strength to 50# or more and pinch strength to 8 pounds or more for improved ability to grasp items at work.    Time 8   Period Weeks    Status New   Target Date 09/25/17               Plan - 07/31/17 7673    Clinical Impression Statement A:  Patient with near [facility] P/ROM.  Added isometrics and aa/rom for left shoulder.  assessed grip and pinch strength, with significant decrease in left hand compared to right.     Plan P:  Add AA/ROM to HEP.  Begin grip and pinch strength traning and issue theraputty for HEP.    Consulted and Agree with Plan of Care Patient      Patient will benefit from skilled therapeutic intervention in order to improve the following deficits and impairments:  Decreased strength, Decreased activity tolerance, Impaired flexibility, Impaired sensation, Pain, Decreased range of motion, Increased fascial restricitons, Impaired UE functional use  Visit Diagnosis: Acute pain of left shoulder - Plan: Ot plan of care cert/re-cert  Stiffness of left shoulder, not elsewhere classified - Plan: Ot plan of care cert/re-cert  Other symptoms and signs involving the musculoskeletal system - Plan: Ot plan of care cert/re-cert    Problem List Patient Active Problem List   Diagnosis Date Noted  . Impingement syndrome of left shoulder   . 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 Gastroesophageal reflux disease without esophagitis 12/26/2016  . Grief at loss of child 11/09/2016  . Sternal fracture 10/06/2015  . Contusion of leg 10/06/2015  . Migraine headache 07/24/2013  . Insomnia 05/30/2013  . Fibromyalgia 05/30/2013    Vangie Bicker, [place], OTR/L [phone]  07/31/2017, 9:58 AM  [facility] [address] [place], [place], [address] Phone: [phone]   Fax:  [phone]  Name: Nichole Brown MRN: [id number] Date of Birth: Aug 02, 1966

## 2017-08-01 DIAGNOSIS — Q72812 Congenital shortening of left lower limb: Secondary | ICD-10-CM | POA: Diagnosis not present

## 2017-08-01 DIAGNOSIS — M5384 Other specified dorsopathies, thoracic region: Secondary | ICD-10-CM | POA: Diagnosis not present

## 2017-08-01 DIAGNOSIS — M9902 Segmental and somatic dysfunction of thoracic region: Secondary | ICD-10-CM | POA: Diagnosis not present

## 2017-08-01 DIAGNOSIS — M9905 Segmental and somatic dysfunction of pelvic region: Secondary | ICD-10-CM | POA: Diagnosis not present

## 2017-08-01 DIAGNOSIS — M9903 Segmental and somatic dysfunction of lumbar region: Secondary | ICD-10-CM | POA: Diagnosis not present

## 2017-08-01 DIAGNOSIS — M9904 Segmental and somatic dysfunction of sacral region: Secondary | ICD-10-CM | POA: Diagnosis not present

## 2017-08-01 DIAGNOSIS — M50122 Cervical disc disorder at C5-C6 level with radiculopathy: Secondary | ICD-10-CM | POA: Diagnosis not present

## 2017-08-01 DIAGNOSIS — M9901 Segmental and somatic dysfunction of cervical region: Secondary | ICD-10-CM | POA: Diagnosis not present

## 2017-08-01 DIAGNOSIS — M5137 Other intervertebral disc degeneration, lumbosacral region: Secondary | ICD-10-CM | POA: Diagnosis not present

## 2017-08-02 ENCOUNTER — Ambulatory Visit (HOSPITAL_COMMUNITY): Payer: 59

## 2017-08-03 DIAGNOSIS — Q72812 Congenital shortening of left lower limb: Secondary | ICD-10-CM | POA: Diagnosis not present

## 2017-08-03 DIAGNOSIS — M9905 Segmental and somatic dysfunction of pelvic region: Secondary | ICD-10-CM | POA: Diagnosis not present

## 2017-08-03 DIAGNOSIS — M9902 Segmental and somatic dysfunction of thoracic region: Secondary | ICD-10-CM | POA: Diagnosis not present

## 2017-08-03 DIAGNOSIS — M5137 Other intervertebral disc degeneration, lumbosacral region: Secondary | ICD-10-CM | POA: Diagnosis not present

## 2017-08-03 DIAGNOSIS — M9904 Segmental and somatic dysfunction of sacral region: Secondary | ICD-10-CM | POA: Diagnosis not present

## 2017-08-03 DIAGNOSIS — M50122 Cervical disc disorder at C5-C6 level with radiculopathy: Secondary | ICD-10-CM | POA: Diagnosis not present

## 2017-08-03 DIAGNOSIS — M5384 Other specified dorsopathies, thoracic region: Secondary | ICD-10-CM | POA: Diagnosis not present

## 2017-08-03 DIAGNOSIS — M9901 Segmental and somatic dysfunction of cervical region: Secondary | ICD-10-CM | POA: Diagnosis not present

## 2017-08-03 DIAGNOSIS — M9903 Segmental and somatic dysfunction of lumbar region: Secondary | ICD-10-CM | POA: Diagnosis not present

## 2017-08-04 ENCOUNTER — Encounter (HOSPITAL_COMMUNITY): Payer: Self-pay | Admitting: Occupational Therapy

## 2017-08-04 ENCOUNTER — Ambulatory Visit (HOSPITAL_COMMUNITY): Payer: 59 | Admitting: Occupational Therapy

## 2017-08-04 DIAGNOSIS — M25612 Stiffness of left shoulder, not elsewhere classified: Secondary | ICD-10-CM | POA: Diagnosis not present

## 2017-08-04 DIAGNOSIS — M25512 Pain in left shoulder: Secondary | ICD-10-CM | POA: Diagnosis not present

## 2017-08-04 DIAGNOSIS — R29898 Other symptoms and signs involving the musculoskeletal system: Secondary | ICD-10-CM

## 2017-08-04 NOTE — Therapy (Signed)
[place], [place], [address] Phone: [phone]   Fax:  [phone]  Occupational Therapy Treatment  Patient Details  Name: Nichole Brown MRN: [id number] Date of Birth: 09-Nov-1966 Referring Provider: Dondra Prader, NP  Encounter Date: 08/04/2017      OT End of Session - 08/04/17 1057    Visit Number 4   Number of Visits 16   Date for OT Re-Evaluation 09/24/17  mini reassess on 08/24/17   Authorization Type UMR; $20 copay   OT Start Time 0946   OT Stop Time 1031   OT Time Calculation (min) 45 min   Activity Tolerance Patient tolerated treatment well   Behavior During Therapy [facility] for tasks assessed/performed      Past Medical History:  Diagnosis Date  . Anxiety   . Bronchitis   . Depression   . Fibromyalgia 2005  . GERD (gastroesophageal reflux disease)   . Insomnia   . Migraine headache   . Pneumonia     Past Surgical History:  Procedure Laterality Date  . ABDOMINAL HYSTERECTOMY    . CESAREAN SECTION    . CHOLECYSTECTOMY  1988  . open heart surgery  1967  . PATENT DUCTUS ARTERIOUS REPAIR  1967  . SHOULDER ARTHROSCOPY Left 06/16/2017   Procedure: LEFT SHOULDER ARTHROSCOPY, DEBRIDEMENT, AND DECOMPRESSION;  Surgeon: Newt Minion, MD;  Location: [place];  Service: Orthopedics;  Laterality: Left;  . SHOULDER SURGERY Left 2008   AC joint    There were no vitals filed for this visit.      Subjective Assessment - 08/04/17 0947    Subjective  S: It is so sore today, this morning was awful.    Currently in Pain? Yes   Pain Score 8    Pain Location Shoulder   Pain Orientation Left   Pain Descriptors / Indicators Aching;Sharp;Sore   Pain Type Acute pain   Pain Radiating Towards elbow and hand   Pain Onset More than a month ago   Pain Frequency Constant   Aggravating Factors  movement   Pain Relieving Factors heat, pain medications   Effect of Pain on Daily Activities severe effect on B/IADL  completion   Multiple Pain Sites No            [facility] OT Assessment - 08/04/17 1008      Assessment   Diagnosis S/P Left shoulder arthroscopy-debridement and decompression     Precautions   Precautions None     Restrictions   Weight Bearing Restrictions No                  OT Treatments/Exercises (OP) - 08/04/17 0948      Exercises   Exercises Shoulder;Hand     Shoulder Exercises: Supine   Protraction PROM;5 reps;AAROM;10 reps   Horizontal ABduction PROM;5 reps;AAROM;10 reps   External Rotation PROM;5 reps;AAROM;10 reps   Internal Rotation PROM;5 reps;AAROM;10 reps   Flexion PROM;5 reps;AAROM;10 reps   ABduction PROM;5 reps;AAROM;10 reps     Hand Exercises   Hand Gripper with Large Beads all beads gripper at 25#   Hand Gripper with Medium Beads all beads gripper 25#     Manual Therapy   Manual Therapy Myofascial release   Manual therapy comments Manual therapy completed prior to exercises.   Myofascial Release Myofascial release and manual stretching completed to left upper arm, trapezius, and scapularis region to decrease fascial restrictions and increase ROM in a pain free zone.  OT Education - 08/04/17 1010    Education provided Yes   Education Details AA/ROM exercises; red theraputty grip strengthening   Person(s) Educated Patient   Methods Explanation;Demonstration;Handout   Comprehension Verbalized understanding;Returned demonstration          OT Short Term Goals - 07/31/17 0951      OT SHORT TERM GOAL #1   Title Pt will be educated on and independent in HEP to improve functional use of LUE during daily tasks.    Time 4   Period Weeks   Status On-going     OT SHORT TERM GOAL #2   Title Pt will improve LUE P/ROM to WNL to increase ability to use LUE as assist during dressing tasks.    Time 4   Period Weeks   Status On-going     OT SHORT TERM GOAL #3   Title Pt will decrease pain in LUE to 5/10 to improve  ability to sleep at night.    Time 4   Period Weeks   Status On-going     OT SHORT TERM GOAL #4   Title Pt will improve LUE strength to 4-/5 to increase ability to use LUE as assist when driving.    Time 4   Period Weeks   Status On-going     OT SHORT TERM GOAL #5   Title Patient will increase left grip strength by 20 pounds and pinch strength by 3 pounds for improved ability to maintain grasp on items when cooking.     Time 4   Period Weeks   Target Date 08/28/17           OT Long Term Goals - 07/31/17 0952      OT LONG TERM GOAL #1   Title Pt will return to highest level of functioning during B/IADL tasks using LUE as non-dominant.    Time 8   Period Weeks   Status On-going     OT LONG TERM GOAL #2   Title Pt will decrease LUE pain to 3/10 or less to improve ability to use LUE during grooming tasks such as washing hair.    Time 8   Period Weeks   Status On-going     OT LONG TERM GOAL #3   Title Pt will decrease fascial restrictions in LUE from mod/max to minimal amounts to improve mobility required for functional reaching tasks.    Time 8   Period Weeks   Status On-going     OT LONG TERM GOAL #4   Title Pt will improve LUE A/ROM to [facility] to increase ability to perform overhead reaching tasks at home and work.    Time 8   Period Weeks   Status On-going     OT LONG TERM GOAL #5   Title Pt will improve LUE strength to 4+/5 to increase ability to pull/push heavy cart at work.    Time 8   Period Weeks   Status On-going     Long Term Additional Goals   Additional Long Term Goals Yes     OT LONG TERM GOAL #6   Title Patient will improve left grip strength to 50# or more and pinch strength to 8 pounds or more for improved ability to grasp items at work.    Time 8   Period Weeks   Status New   Target Date 09/25/17               Plan - 08/04/17 1057  Clinical Impression Statement A: Continued with AA/ROM supine, pt continues to experience  numbness/tingling at all times from the shoulder to the hand, this is exacerbated by flexion and abduction and the elbow to hand become complete numb at times-resolving with pt massaging hand/forearm and flexing/extending fingers. Pt reports her shoulder joint feels loose at times, like th joint is popping in and out. Added grip strengthening this session, increased numbness in the hand during activity. Pt demonstrates improved range with P/ROM and AA/ROM, occasional cuing for form and technique.    Plan P: Follow up on grip strengthening, add AA/ROM in standing   OT Home Exercise Plan 8/15: table slides; 8/24: AA/ROM supine and grip strengthening   Consulted and Agree with Plan of Care Patient      Patient will benefit from skilled therapeutic intervention in order to improve the following deficits and impairments:  Decreased strength, Decreased activity tolerance, Impaired flexibility, Impaired sensation, Pain, Decreased range of motion, Increased fascial restricitons, Impaired UE functional use  Visit Diagnosis: Acute pain of left shoulder  Stiffness of left shoulder, not elsewhere classified  Other symptoms and signs involving the musculoskeletal system    Problem List Patient Active Problem List   Diagnosis Date Noted  . Impingement syndrome of left shoulder   . 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 Gastroesophageal reflux disease without esophagitis 12/26/2016  . Grief at loss of child 11/09/2016  . Sternal fracture 10/06/2015  . Contusion of leg 10/06/2015  . Migraine headache 07/24/2013  . Insomnia 05/30/2013  . Fibromyalgia 05/30/2013   Guadelupe Sabin, OTR/L  [phone] 08/04/2017, 11:02 AM  [facility] [address] [place], [place], [address] Phone: [phone]   Fax:  [phone]  Name: CLARAMAE RIGDON MRN: [id number] Date of Birth: September 08, 1966

## 2017-08-04 NOTE — Patient Instructions (Addendum)
Home Exercises Program Theraputty Exercises  Do the following exercises 2 times a day using your affected hand.  1. Roll putty into a ball.  2. Make into a pancake.  3. Roll putty into a roll.  4. Pinch along log with first finger and thumb.   5. Make into a ball.  6. Roll it back into a log.   7. Pinch using thumb and side of first finger.  8. Roll into a ball, then flatten into a pancake.  9. Using your fingers, make putty into a mountain.          AA/ROM Exercises  Perform each exercise ____10____ reps. 2-3x days.   Protraction   Start by holding a wand or cane at chest height.  Next, slowly push the wand outwards in front of your body so that your elbows become fully straightened. Then, return to the original position.     Shoulder FLEXION   In the standing position, hold a wand/cane with both arms, palms down on both sides. Raise up the wand/cane allowing your unaffected arm to perform most of the effort. Your affected arm should be partially relaxed.      Internal/External ROTATION   In the standing position, hold a wand/cane with both hands keeping your elbows bent. Move your arms and wand/cane to one side.  Your affected arm should be partially relaxed while your unaffected arm performs most of the effort.       Shoulder ABDUCTION  While holding a wand/cane palm face up on the injured side and palm face down on the uninjured side, slowly raise up your injured arm to the side.                     Horizontal Abduction/Adduction      Straight arms holding cane at shoulder height, bring cane to right, center, left. Repeat starting to left.   Copyright  VHI. All rights reserved.

## 2017-08-07 DIAGNOSIS — M9902 Segmental and somatic dysfunction of thoracic region: Secondary | ICD-10-CM | POA: Diagnosis not present

## 2017-08-07 DIAGNOSIS — Q72812 Congenital shortening of left lower limb: Secondary | ICD-10-CM | POA: Diagnosis not present

## 2017-08-07 DIAGNOSIS — M9905 Segmental and somatic dysfunction of pelvic region: Secondary | ICD-10-CM | POA: Diagnosis not present

## 2017-08-07 DIAGNOSIS — M9904 Segmental and somatic dysfunction of sacral region: Secondary | ICD-10-CM | POA: Diagnosis not present

## 2017-08-07 DIAGNOSIS — M9901 Segmental and somatic dysfunction of cervical region: Secondary | ICD-10-CM | POA: Diagnosis not present

## 2017-08-07 DIAGNOSIS — M5137 Other intervertebral disc degeneration, lumbosacral region: Secondary | ICD-10-CM | POA: Diagnosis not present

## 2017-08-07 DIAGNOSIS — M50122 Cervical disc disorder at C5-C6 level with radiculopathy: Secondary | ICD-10-CM | POA: Diagnosis not present

## 2017-08-07 DIAGNOSIS — M9903 Segmental and somatic dysfunction of lumbar region: Secondary | ICD-10-CM | POA: Diagnosis not present

## 2017-08-07 DIAGNOSIS — M5384 Other specified dorsopathies, thoracic region: Secondary | ICD-10-CM | POA: Diagnosis not present

## 2017-08-08 DIAGNOSIS — M9903 Segmental and somatic dysfunction of lumbar region: Secondary | ICD-10-CM | POA: Diagnosis not present

## 2017-08-08 DIAGNOSIS — M9902 Segmental and somatic dysfunction of thoracic region: Secondary | ICD-10-CM | POA: Diagnosis not present

## 2017-08-08 DIAGNOSIS — M9901 Segmental and somatic dysfunction of cervical region: Secondary | ICD-10-CM | POA: Diagnosis not present

## 2017-08-08 DIAGNOSIS — Q72812 Congenital shortening of left lower limb: Secondary | ICD-10-CM | POA: Diagnosis not present

## 2017-08-08 DIAGNOSIS — M50122 Cervical disc disorder at C5-C6 level with radiculopathy: Secondary | ICD-10-CM | POA: Diagnosis not present

## 2017-08-08 DIAGNOSIS — M5137 Other intervertebral disc degeneration, lumbosacral region: Secondary | ICD-10-CM | POA: Diagnosis not present

## 2017-08-08 DIAGNOSIS — M5384 Other specified dorsopathies, thoracic region: Secondary | ICD-10-CM | POA: Diagnosis not present

## 2017-08-08 DIAGNOSIS — M9904 Segmental and somatic dysfunction of sacral region: Secondary | ICD-10-CM | POA: Diagnosis not present

## 2017-08-08 DIAGNOSIS — M9905 Segmental and somatic dysfunction of pelvic region: Secondary | ICD-10-CM | POA: Diagnosis not present

## 2017-08-09 ENCOUNTER — Encounter (INDEPENDENT_AMBULATORY_CARE_PROVIDER_SITE_OTHER): Payer: Self-pay | Admitting: Family

## 2017-08-09 ENCOUNTER — Encounter (HOSPITAL_COMMUNITY): Payer: Self-pay

## 2017-08-09 ENCOUNTER — Ambulatory Visit (HOSPITAL_COMMUNITY): Payer: 59

## 2017-08-09 ENCOUNTER — Ambulatory Visit (INDEPENDENT_AMBULATORY_CARE_PROVIDER_SITE_OTHER): Payer: 59 | Admitting: Family

## 2017-08-09 DIAGNOSIS — R29898 Other symptoms and signs involving the musculoskeletal system: Secondary | ICD-10-CM

## 2017-08-09 DIAGNOSIS — M25612 Stiffness of left shoulder, not elsewhere classified: Secondary | ICD-10-CM

## 2017-08-09 DIAGNOSIS — M7542 Impingement syndrome of left shoulder: Secondary | ICD-10-CM

## 2017-08-09 DIAGNOSIS — M25512 Pain in left shoulder: Secondary | ICD-10-CM | POA: Diagnosis not present

## 2017-08-09 DIAGNOSIS — M7502 Adhesive capsulitis of left shoulder: Secondary | ICD-10-CM

## 2017-08-09 DIAGNOSIS — M542 Cervicalgia: Secondary | ICD-10-CM

## 2017-08-09 NOTE — Therapy (Signed)
[place], [place], [address] Phone: [phone]   Fax:  [phone]  Occupational Therapy Treatment  Patient Details  Name: Nichole Brown MRN: [id number] Date of Birth: 1966-09-21 Referring Provider: Dondra Prader, NP  Encounter Date: 08/09/2017      OT End of Session - 08/09/17 1012    Visit Number 5   Number of Visits 16   Date for OT Re-Evaluation 09/24/17  mini reassess on 08/24/17   Authorization Type UMR; $20 copay   OT Start Time 0906   OT Stop Time 0947   OT Time Calculation (min) 41 min   Activity Tolerance Patient tolerated treatment well   Behavior During Therapy [facility] for tasks assessed/performed      Past Medical History:  Diagnosis Date  . Anxiety   . Bronchitis   . Depression   . Fibromyalgia 2005  . GERD (gastroesophageal reflux disease)   . Insomnia   . Migraine headache   . Pneumonia     Past Surgical History:  Procedure Laterality Date  . ABDOMINAL HYSTERECTOMY    . CESAREAN SECTION    . CHOLECYSTECTOMY  1988  . open heart surgery  1967  . PATENT DUCTUS ARTERIOUS REPAIR  1967  . SHOULDER ARTHROSCOPY Left 06/16/2017   Procedure: LEFT SHOULDER ARTHROSCOPY, DEBRIDEMENT, AND DECOMPRESSION;  Surgeon: Newt Minion, MD;  Location: [place];  Service: Orthopedics;  Laterality: Left;  . SHOULDER SURGERY Left 2008   AC joint    There were no vitals filed for this visit.      Subjective Assessment - 08/09/17 1012    Subjective  S: Certain movements make different types of numbness.    Currently in Pain? Yes   Pain Score 5    Pain Location Shoulder   Pain Orientation Left   Pain Descriptors / Indicators Aching;Sore   Pain Type Acute pain            [facility] OT Assessment - 08/09/17 0919      Assessment   Diagnosis S/P Left shoulder arthroscopy-debridement and decompression     Precautions   Precautions None     ROM / Strength   AROM / PROM / Strength AROM;PROM;Strength      AROM   Overall AROM Comments Assessed seated, er/IR adducted   AROM Assessment Site Shoulder   Right/Left Shoulder Left   Left Shoulder Flexion 135 Degrees  previous: 102   Left Shoulder ABduction 145 Degrees  previous: 102   Left Shoulder Internal Rotation 90 Degrees  previous: same   Left Shoulder External Rotation 60 Degrees  previous: 50     PROM   Overall PROM Comments Assessed supine, er/IR adducted   PROM Assessment Site Shoulder   Right/Left Shoulder Left   Left Shoulder Flexion 135 Degrees  previous: 120   Left Shoulder ABduction 180 Degrees  prevous: 119   Left Shoulder Internal Rotation 90 Degrees  previous: same   Left Shoulder External Rotation 35 Degrees  previous: 39     Strength   Overall Strength Comments Assessed seated, er/IR adducted   Strength Assessment Site Shoulder   Right/Left Shoulder Left   Left Shoulder Flexion 3/5  previous: 3-/5   Left Shoulder ABduction 3/5  previous: 3-/5   Left Shoulder Internal Rotation 3/5  previous: same   Left Shoulder External Rotation 3/5  previous: 3-/5                  OT  Treatments/Exercises (OP) - 08/09/17 0925      Exercises   Exercises Shoulder     Shoulder Exercises: Supine   Protraction PROM;5 reps;AAROM;10 reps   Horizontal ABduction PROM;5 reps;AAROM;10 reps   External Rotation PROM;5 reps;AAROM;10 reps   Internal Rotation PROM;5 reps;AAROM;10 reps   Flexion PROM;5 reps;AAROM;10 reps   ABduction PROM;5 reps;AAROM;10 reps     Shoulder Exercises: Standing   Protraction AAROM;10 reps   Horizontal ABduction AAROM;10 reps   External Rotation AAROM;10 reps   Internal Rotation AAROM;10 reps   Flexion AAROM;10 reps   ABduction AAROM;10 reps     Manual Therapy   Manual Therapy Myofascial release   Manual therapy comments Manual therapy completed prior to exercises.   Myofascial Release Myofascial release and manual stretching completed to left upper arm, trapezius, and scapularis  region to decrease fascial restrictions and increase ROM in a pain free zone.                   OT Short Term Goals - 07/31/17 0951      OT SHORT TERM GOAL #1   Title Pt will be educated on and independent in HEP to improve functional use of LUE during daily tasks.    Time 4   Period Weeks   Status On-going     OT SHORT TERM GOAL #2   Title Pt will improve LUE P/ROM to WNL to increase ability to use LUE as assist during dressing tasks.    Time 4   Period Weeks   Status On-going     OT SHORT TERM GOAL #3   Title Pt will decrease pain in LUE to 5/10 to improve ability to sleep at night.    Time 4   Period Weeks   Status On-going     OT SHORT TERM GOAL #4   Title Pt will improve LUE strength to 4-/5 to increase ability to use LUE as assist when driving.    Time 4   Period Weeks   Status On-going     OT SHORT TERM GOAL #5   Title Patient will increase left grip strength by 20 pounds and pinch strength by 3 pounds for improved ability to maintain grasp on items when cooking.     Time 4   Period Weeks   Target Date 08/28/17           OT Long Term Goals - 07/31/17 0952      OT LONG TERM GOAL #1   Title Pt will return to highest level of functioning during B/IADL tasks using LUE as non-dominant.    Time 8   Period Weeks   Status On-going     OT LONG TERM GOAL #2   Title Pt will decrease LUE pain to 3/10 or less to improve ability to use LUE during grooming tasks such as washing hair.    Time 8   Period Weeks   Status On-going     OT LONG TERM GOAL #3   Title Pt will decrease fascial restrictions in LUE from mod/max to minimal amounts to improve mobility required for functional reaching tasks.    Time 8   Period Weeks   Status On-going     OT LONG TERM GOAL #4   Title Pt will improve LUE A/ROM to [facility] to increase ability to perform overhead reaching tasks at home and work.    Time 8   Period Weeks   Status On-going     OT  LONG TERM GOAL #5   Title  Pt will improve LUE strength to 4+/5 to increase ability to pull/push heavy cart at work.    Time 8   Period Weeks   Status On-going     Long Term Additional Goals   Additional Long Term Goals Yes     OT LONG TERM GOAL #6   Title Patient will improve left grip strength to 50# or more and pinch strength to 8 pounds or more for improved ability to grasp items at work.    Time 8   Period Weeks   Status New   Target Date 09/25/17               Plan - 08/09/17 1013    Clinical Impression Statement A: Progressed to standing AA/ROM with VC for form and technique. patient has functional passive ROM at this point with intermittent pain felt with certain exercises.    Plan P: Add proximal shoulder strengthening.       Patient will benefit from skilled therapeutic intervention in order to improve the following deficits and impairments:  Decreased strength, Decreased activity tolerance, Impaired flexibility, Impaired sensation, Pain, Decreased range of motion, Increased fascial restricitons, Impaired UE functional use  Visit Diagnosis: Acute pain of left shoulder  Stiffness of left shoulder, not elsewhere classified  Other symptoms and signs involving the musculoskeletal system    Problem List Patient Active Problem List   Diagnosis Date Noted  . Impingement syndrome of left shoulder   . 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 Gastroesophageal reflux disease without esophagitis 12/26/2016  . Grief at loss of child 11/09/2016  . Sternal fracture 10/06/2015  . Contusion of leg 10/06/2015  . Migraine headache 07/24/2013  . Insomnia 05/30/2013  . Fibromyalgia 05/30/2013   Ailene Ravel, OTR/L,CBIS  [phone]  08/09/2017, 10:16 AM  [facility] [address] [place], [place], [address] Phone: [phone]   Fax:  [phone]  Name: Nichole Brown MRN: [id number] Date of Birth: 1966/05/31

## 2017-08-09 NOTE — Progress Notes (Signed)
Post-Op Visit Note   Patient: Nichole Brown           Date of Birth: 05/10/1966           MRN: [id number] Visit Date: 08/09/2017 PCP: Mikey Kirschner, MD  Chief Complaint:  Chief Complaint  Patient presents with  . Left Shoulder - Follow-up    HPI:  The patient is a 51 year old woman who presents today status post arthroscopy for left shoulder impingement and adhesive capsulitis nearly 2 months ago. Has been in PT for last several weeks. Feel this is helping.   She continues to complain of numbness down her biceps. this radiates to her hand, all 5 fingers. States that her hand occasionally feels cold. Feels grip is weak. States has been dropping things.   she has anterior shoulder pain she is pleased with her improvement in range of motion however continues to have pain. This is not associated with any sort of activity or movement.    Ortho Exam on examination the portals are well-healed there is no erythema no swelling. her shoulder range of motion flexion to 135, abduction 145, internal rotation 90, external rotation 60. Distal neurovascular intact. Grip strength 3/5 on left, 5/5 on right. Hand normothermic.   C spine nontender. Trapezius tenderness on left. Painless rom. spurlings positive.  Visit Diagnoses:  1. Cervicalgia   2. Impingement syndrome of left shoulder   3. Adhesive capsulitis of left shoulder     Plan: will continue with PT for left shoulder. Have provided a return to work note. Will proceed with C spine MRI.   Follow-Up Instructions: Return for mri review.   Imaging: No results found.   Did review radiographs of c spine from Chiropractor which were done about 2 weeks ago. No acute finding, spondylosis or listhesis.  Orders:  Orders Placed This Encounter  Procedures  . MR Cervical Spine w/o contrast   No orders of the defined types were placed in this encounter.    PMFS History: Patient Active Problem List   Diagnosis Date Noted  .  Impingement syndrome of left shoulder   . 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 Gastroesophageal reflux disease without esophagitis 12/26/2016  . Grief at loss of child 11/09/2016  . Sternal fracture 10/06/2015  . Contusion of leg 10/06/2015  . Migraine headache 07/24/2013  . Insomnia 05/30/2013  . Fibromyalgia 05/30/2013   Past Medical History:  Diagnosis Date  . Anxiety   . Bronchitis   . Depression   . Fibromyalgia 2005  . GERD (gastroesophageal reflux disease)   . Insomnia   . Migraine headache   . Pneumonia     Family History  Problem Relation Age of Onset  . Hypertension Maternal Grandmother   . Diabetes Maternal Grandmother   . Heart disease Maternal Grandmother 40       MI  . Hypertension Maternal Grandfather   . Throat cancer Maternal Grandfather   . Breast cancer Mother 58  . Hypertension Father     Past Surgical History:  Procedure Laterality Date  . ABDOMINAL HYSTERECTOMY    . CESAREAN SECTION    . CHOLECYSTECTOMY  1988  . open heart surgery  1967  . PATENT DUCTUS ARTERIOUS REPAIR  1967  . SHOULDER ARTHROSCOPY Left 06/16/2017   Procedure: LEFT SHOULDER ARTHROSCOPY, DEBRIDEMENT, AND DECOMPRESSION;  Surgeon: Newt Minion, MD;  Location: [facility] [place];  Service: Orthopedics;  Laterality: Left;  . SHOULDER SURGERY Left 2008   [facility] joint  Social History   Occupational History  . Not on file.   Social History Main Topics  . Smoking status: Never Smoker  . Smokeless tobacco: Never Used  . Alcohol use Yes     Comment: occassionally  . Drug use: No  . Sexual activity: Yes    Birth control/ protection: Surgical

## 2017-08-10 DIAGNOSIS — M5137 Other intervertebral disc degeneration, lumbosacral region: Secondary | ICD-10-CM | POA: Diagnosis not present

## 2017-08-10 DIAGNOSIS — M9903 Segmental and somatic dysfunction of lumbar region: Secondary | ICD-10-CM | POA: Diagnosis not present

## 2017-08-10 DIAGNOSIS — M9902 Segmental and somatic dysfunction of thoracic region: Secondary | ICD-10-CM | POA: Diagnosis not present

## 2017-08-10 DIAGNOSIS — M9901 Segmental and somatic dysfunction of cervical region: Secondary | ICD-10-CM | POA: Diagnosis not present

## 2017-08-10 DIAGNOSIS — M50122 Cervical disc disorder at C5-C6 level with radiculopathy: Secondary | ICD-10-CM | POA: Diagnosis not present

## 2017-08-10 DIAGNOSIS — M9905 Segmental and somatic dysfunction of pelvic region: Secondary | ICD-10-CM | POA: Diagnosis not present

## 2017-08-10 DIAGNOSIS — M9904 Segmental and somatic dysfunction of sacral region: Secondary | ICD-10-CM | POA: Diagnosis not present

## 2017-08-10 DIAGNOSIS — M5384 Other specified dorsopathies, thoracic region: Secondary | ICD-10-CM | POA: Diagnosis not present

## 2017-08-10 DIAGNOSIS — Q72812 Congenital shortening of left lower limb: Secondary | ICD-10-CM | POA: Diagnosis not present

## 2017-08-11 ENCOUNTER — Telehealth: Payer: Self-pay | Admitting: Family Medicine

## 2017-08-11 ENCOUNTER — Encounter (HOSPITAL_COMMUNITY): Payer: Self-pay | Admitting: Occupational Therapy

## 2017-08-11 ENCOUNTER — Ambulatory Visit (HOSPITAL_COMMUNITY): Payer: 59 | Admitting: Occupational Therapy

## 2017-08-11 ENCOUNTER — Ambulatory Visit (INDEPENDENT_AMBULATORY_CARE_PROVIDER_SITE_OTHER): Payer: 59 | Admitting: Family

## 2017-08-11 DIAGNOSIS — R29898 Other symptoms and signs involving the musculoskeletal system: Secondary | ICD-10-CM

## 2017-08-11 DIAGNOSIS — M25612 Stiffness of left shoulder, not elsewhere classified: Secondary | ICD-10-CM | POA: Diagnosis not present

## 2017-08-11 DIAGNOSIS — M25512 Pain in left shoulder: Secondary | ICD-10-CM | POA: Diagnosis not present

## 2017-08-11 NOTE — Telephone Encounter (Signed)
Message for Nichole Brown- patient was seen 8/3 for migraines and needing updated FMLA. They finally sent them over. They are in your box.She needs as soon as possible.

## 2017-08-11 NOTE — Telephone Encounter (Signed)
Just let me know when complete and I will sign. Thanks.

## 2017-08-11 NOTE — Therapy (Signed)
[place], [place], [address] Phone: [phone]   Fax:  [phone]  Occupational Therapy Treatment  Patient Details  Name: Nichole Brown MRN: [id number] Date of Birth: 05-10-1966 Referring Provider: Dondra Prader, NP  Encounter Date: 08/11/2017      OT End of Session - 08/11/17 1149    Visit Number 6   Number of Visits 16   Date for OT Re-Evaluation 09/24/17  mini reassess on 08/24/17   Authorization Type UMR; $20 copay   OT Start Time 1103   OT Stop Time 1143   OT Time Calculation (min) 40 min   Activity Tolerance Patient tolerated treatment well   Behavior During Therapy [facility] for tasks assessed/performed      Past Medical History:  Diagnosis Date  . Anxiety   . Bronchitis   . Depression   . Fibromyalgia 2005  . GERD (gastroesophageal reflux disease)   . Insomnia   . Migraine headache   . Pneumonia     Past Surgical History:  Procedure Laterality Date  . ABDOMINAL HYSTERECTOMY    . CESAREAN SECTION    . CHOLECYSTECTOMY  1988  . open heart surgery  1967  . PATENT DUCTUS ARTERIOUS REPAIR  1967  . SHOULDER ARTHROSCOPY Left 06/16/2017   Procedure: LEFT SHOULDER ARTHROSCOPY, DEBRIDEMENT, AND DECOMPRESSION;  Surgeon: Newt Minion, MD;  Location: [place];  Service: Orthopedics;  Laterality: Left;  . SHOULDER SURGERY Left 2008   AC joint    There were no vitals filed for this visit.      Subjective Assessment - 08/11/17 1102    Subjective  S: The doctor is sending me for an MRI.    Currently in Pain? Yes   Pain Score 8    Pain Location Shoulder   Pain Orientation Left   Pain Descriptors / Indicators Aching;Sore   Pain Type Acute pain   Pain Radiating Towards elbow to hand    Pain Onset More than a month ago   Pain Frequency Constant   Aggravating Factors  movement   Pain Relieving Factors heat, pain medication   Effect of Pain on Daily Activities severe effect on B/IADL completion    Multiple Pain Sites No            [facility] OT Assessment - 08/11/17 1102      Assessment   Diagnosis S/P Left shoulder arthroscopy-debridement and decompression     Precautions   Precautions None                  OT Treatments/Exercises (OP) - 08/11/17 1103      Exercises   Exercises Shoulder     Shoulder Exercises: Supine   Protraction PROM;5 reps;AAROM;12 reps   Horizontal ABduction PROM;5 reps;AAROM;12 reps   External Rotation PROM;5 reps   Internal Rotation PROM;5 reps   Flexion PROM;5 reps;AAROM;12 reps   ABduction PROM;5 reps     Shoulder Exercises: Standing   Protraction AAROM;10 reps   Horizontal ABduction AAROM;10 reps   External Rotation AROM;10 reps   Internal Rotation AROM;10 reps   Flexion AAROM;10 reps   Extension Theraband;10 reps   Theraband Level (Shoulder Extension) Level 2 (Red)     Shoulder Exercises: ROM/Strengthening   Prot/Ret//Elev/Dep 1'     Shoulder Exercises: Stretch   Corner Stretch 3 reps;10 seconds     Functional Reaching Activities   Mid Level Pt completed pinch tree activity working on pinch and shoulder flexion. Pt  able to place pins to 12" below tree before burning pain and numbness began. Pt notes decreased coordination when arm is numb.      Manual Therapy   Manual Therapy Myofascial release   Manual therapy comments Manual therapy completed prior to exercises.   Myofascial Release Myofascial release and manual stretching completed to left upper arm, trapezius, and scapularis region to decrease fascial restrictions and increase ROM in a pain free zone.                   OT Short Term Goals - 07/31/17 0951      OT SHORT TERM GOAL #1   Title Pt will be educated on and independent in HEP to improve functional use of LUE during daily tasks.    Time 4   Period Weeks   Status On-going     OT SHORT TERM GOAL #2   Title Pt will improve LUE P/ROM to WNL to increase ability to use LUE as assist during dressing  tasks.    Time 4   Period Weeks   Status On-going     OT SHORT TERM GOAL #3   Title Pt will decrease pain in LUE to 5/10 to improve ability to sleep at night.    Time 4   Period Weeks   Status On-going     OT SHORT TERM GOAL #4   Title Pt will improve LUE strength to 4-/5 to increase ability to use LUE as assist when driving.    Time 4   Period Weeks   Status On-going     OT SHORT TERM GOAL #5   Title Patient will increase left grip strength by 20 pounds and pinch strength by 3 pounds for improved ability to maintain grasp on items when cooking.     Time 4   Period Weeks   Target Date 08/28/17           OT Long Term Goals - 07/31/17 0952      OT LONG TERM GOAL #1   Title Pt will return to highest level of functioning during B/IADL tasks using LUE as non-dominant.    Time 8   Period Weeks   Status On-going     OT LONG TERM GOAL #2   Title Pt will decrease LUE pain to 3/10 or less to improve ability to use LUE during grooming tasks such as washing hair.    Time 8   Period Weeks   Status On-going     OT LONG TERM GOAL #3   Title Pt will decrease fascial restrictions in LUE from mod/max to minimal amounts to improve mobility required for functional reaching tasks.    Time 8   Period Weeks   Status On-going     OT LONG TERM GOAL #4   Title Pt will improve LUE A/ROM to [facility] to increase ability to perform overhead reaching tasks at home and work.    Time 8   Period Weeks   Status On-going     OT LONG TERM GOAL #5   Title Pt will improve LUE strength to 4+/5 to increase ability to pull/push heavy cart at work.    Time 8   Period Weeks   Status On-going     Long Term Additional Goals   Additional Long Term Goals Yes     OT LONG TERM GOAL #6   Title Patient will improve left grip strength to 50# or more and pinch strength to 8  pounds or more for improved ability to grasp items at work.    Time 8   Period Weeks   Status New   Target Date 09/25/17                Plan - 08/11/17 1149    Clinical Impression Statement A: Pt reports MD is sending her for an MRI to determine if there are nerve impingments in her neck. During session pt experiencing burning/shooting pain at times, limited and adjusted exercises according to pain. Pt able to complete er/IR A/ROM with less pain than during AA/ROM. Pt unable to complete row with scapular theraband due to severe pain. Added corner stretch, minimal discomfort noted however increased numbness present. Verbal cuing for form with exercises.    Plan P: attempt A/ROM if pt able to complete and tolerate, add proximal shoulder strengthening   OT Home Exercise Plan 8/15: table slides; 8/24: AA/ROM supine and grip strengthening   Consulted and Agree with Plan of Care Patient      Patient will benefit from skilled therapeutic intervention in order to improve the following deficits and impairments:  Decreased strength, Decreased activity tolerance, Impaired flexibility, Impaired sensation, Pain, Decreased range of motion, Increased fascial restricitons, Impaired UE functional use  Visit Diagnosis: Acute pain of left shoulder  Stiffness of left shoulder, not elsewhere classified  Other symptoms and signs involving the musculoskeletal system    Problem List Patient Active Problem List   Diagnosis Date Noted  . Impingement syndrome of left shoulder   . 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 Gastroesophageal reflux disease without esophagitis 12/26/2016  . Grief at loss of child 11/09/2016  . Sternal fracture 10/06/2015  . Contusion of leg 10/06/2015  . Migraine headache 07/24/2013  . Insomnia 05/30/2013  . Fibromyalgia 05/30/2013   Guadelupe Sabin, OTR/L  [phone] 08/11/2017, 11:52 AM  [place] [address] [place], [place], [address] Phone: [phone]   Fax:  [phone]  Name: TOINI FAILLA MRN: [id number] Date of  Birth: 01-09-1966

## 2017-08-15 DIAGNOSIS — M9903 Segmental and somatic dysfunction of lumbar region: Secondary | ICD-10-CM | POA: Diagnosis not present

## 2017-08-15 DIAGNOSIS — M9904 Segmental and somatic dysfunction of sacral region: Secondary | ICD-10-CM | POA: Diagnosis not present

## 2017-08-15 DIAGNOSIS — Q72812 Congenital shortening of left lower limb: Secondary | ICD-10-CM | POA: Diagnosis not present

## 2017-08-15 DIAGNOSIS — M50122 Cervical disc disorder at C5-C6 level with radiculopathy: Secondary | ICD-10-CM | POA: Diagnosis not present

## 2017-08-15 DIAGNOSIS — M5384 Other specified dorsopathies, thoracic region: Secondary | ICD-10-CM | POA: Diagnosis not present

## 2017-08-15 DIAGNOSIS — M5137 Other intervertebral disc degeneration, lumbosacral region: Secondary | ICD-10-CM | POA: Diagnosis not present

## 2017-08-15 DIAGNOSIS — M9905 Segmental and somatic dysfunction of pelvic region: Secondary | ICD-10-CM | POA: Diagnosis not present

## 2017-08-15 DIAGNOSIS — M9902 Segmental and somatic dysfunction of thoracic region: Secondary | ICD-10-CM | POA: Diagnosis not present

## 2017-08-15 DIAGNOSIS — M9901 Segmental and somatic dysfunction of cervical region: Secondary | ICD-10-CM | POA: Diagnosis not present

## 2017-08-16 ENCOUNTER — Encounter (HOSPITAL_COMMUNITY): Payer: Self-pay | Admitting: Occupational Therapy

## 2017-08-16 ENCOUNTER — Ambulatory Visit (HOSPITAL_COMMUNITY): Payer: 59 | Attending: Family | Admitting: Occupational Therapy

## 2017-08-16 DIAGNOSIS — M25612 Stiffness of left shoulder, not elsewhere classified: Secondary | ICD-10-CM | POA: Diagnosis not present

## 2017-08-16 DIAGNOSIS — R29898 Other symptoms and signs involving the musculoskeletal system: Secondary | ICD-10-CM | POA: Insufficient documentation

## 2017-08-16 DIAGNOSIS — M25512 Pain in left shoulder: Secondary | ICD-10-CM | POA: Diagnosis not present

## 2017-08-16 NOTE — Therapy (Signed)
[place], [place], [address] Phone: [phone]   Fax:  [phone]  Occupational Therapy Treatment  Patient Details  Name: Nichole Brown MRN: [id number] Date of Birth: Mar 17, 1966 Referring Provider: Dondra Prader, NP  Encounter Date: 08/16/2017      OT End of Session - 08/16/17 1642    Visit Number 7   Number of Visits 16   Date for OT Re-Evaluation 09/24/17  mini reassess on 08/24/17   Authorization Type UMR; $20 copay   OT Start Time 1515   OT Stop Time 1600   OT Time Calculation (min) 45 min   Activity Tolerance Patient tolerated treatment well   Behavior During Therapy [facility] for tasks assessed/performed      Past Medical History:  Diagnosis Date  . Anxiety   . Bronchitis   . Depression   . Fibromyalgia 2005  . GERD (gastroesophageal reflux disease)   . Insomnia   . Migraine headache   . Pneumonia     Past Surgical History:  Procedure Laterality Date  . ABDOMINAL HYSTERECTOMY    . CESAREAN SECTION    . CHOLECYSTECTOMY  1988  . open heart surgery  1967  . PATENT DUCTUS ARTERIOUS REPAIR  1967  . SHOULDER ARTHROSCOPY Left 06/16/2017   Procedure: LEFT SHOULDER ARTHROSCOPY, DEBRIDEMENT, AND DECOMPRESSION;  Surgeon: Newt Minion, MD;  Location: [place];  Service: Orthopedics;  Laterality: Left;  . SHOULDER SURGERY Left 2008   AC joint    There were no vitals filed for this visit.      Subjective Assessment - 08/16/17 1515    Subjective  S: This morning at work was difficult. (abduction with er)   Currently in Pain? Yes   Pain Score 7    Pain Location Shoulder   Pain Orientation Left   Pain Descriptors / Indicators Aching;Sore   Pain Type Acute pain   Pain Radiating Towards elbow to hand   Pain Onset More than a month ago   Pain Frequency Constant   Aggravating Factors  movement   Pain Relieving Factors heat, pain medication   Effect of Pain on Daily Activities severe on B/IADL completion    Multiple Pain Sites No            [facility] OT Assessment - 08/16/17 1515      Assessment   Diagnosis S/P Left shoulder arthroscopy-debridement and decompression     Precautions   Precautions None                  OT Treatments/Exercises (OP) - 08/16/17 1521      Exercises   Exercises Shoulder     Shoulder Exercises: Supine   Protraction PROM;5 reps;AROM;10 reps   Horizontal ABduction PROM;5 reps;AROM;10 reps   External Rotation PROM;5 reps;AROM;10 reps   Internal Rotation PROM;5 reps;AROM;10 reps   Flexion PROM;5 reps;AROM;10 reps   ABduction PROM;5 reps;AROM;10 reps     Shoulder Exercises: Seated   Extension AROM;12 reps   Row AROM;12 reps     Shoulder Exercises: Standing   Protraction AROM;10 reps   Horizontal ABduction AROM;10 reps   External Rotation AROM;10 reps   Internal Rotation AROM;10 reps   Flexion AROM;10 reps   ABduction AROM;10 reps     Shoulder Exercises: ROM/Strengthening   Proximal Shoulder Strengthening, Supine 10X each no rest breaks     Shoulder Exercises: Stretch   Corner Stretch 3 reps;10 seconds     Manual Therapy  Manual Therapy Myofascial release   Manual therapy comments Manual therapy completed prior to exercises.   Myofascial Release Myofascial release and manual stretching completed to left upper arm, trapezius, and scapularis region to decrease fascial restrictions and increase ROM in a pain free zone.                   OT Short Term Goals - 07/31/17 0951      OT SHORT TERM GOAL #1   Title Pt will be educated on and independent in HEP to improve functional use of LUE during daily tasks.    Time 4   Period Weeks   Status On-going     OT SHORT TERM GOAL #2   Title Pt will improve LUE P/ROM to WNL to increase ability to use LUE as assist during dressing tasks.    Time 4   Period Weeks   Status On-going     OT SHORT TERM GOAL #3   Title Pt will decrease pain in LUE to 5/10 to improve ability to sleep  at night.    Time 4   Period Weeks   Status On-going     OT SHORT TERM GOAL #4   Title Pt will improve LUE strength to 4-/5 to increase ability to use LUE as assist when driving.    Time 4   Period Weeks   Status On-going     OT SHORT TERM GOAL #5   Title Patient will increase left grip strength by 20 pounds and pinch strength by 3 pounds for improved ability to maintain grasp on items when cooking.     Time 4   Period Weeks   Target Date 08/28/17           OT Long Term Goals - 07/31/17 0952      OT LONG TERM GOAL #1   Title Pt will return to highest level of functioning during B/IADL tasks using LUE as non-dominant.    Time 8   Period Weeks   Status On-going     OT LONG TERM GOAL #2   Title Pt will decrease LUE pain to 3/10 or less to improve ability to use LUE during grooming tasks such as washing hair.    Time 8   Period Weeks   Status On-going     OT LONG TERM GOAL #3   Title Pt will decrease fascial restrictions in LUE from mod/max to minimal amounts to improve mobility required for functional reaching tasks.    Time 8   Period Weeks   Status On-going     OT LONG TERM GOAL #4   Title Pt will improve LUE A/ROM to [facility] to increase ability to perform overhead reaching tasks at home and work.    Time 8   Period Weeks   Status On-going     OT LONG TERM GOAL #5   Title Pt will improve LUE strength to 4+/5 to increase ability to pull/push heavy cart at work.    Time 8   Period Weeks   Status On-going     Long Term Additional Goals   Additional Long Term Goals Yes     OT LONG TERM GOAL #6   Title Patient will improve left grip strength to 50# or more and pinch strength to 8 pounds or more for improved ability to grasp items at work.    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 During treatment session, er/IR with abduction is less painful than adduction with P/ROM, however during A/ROM pt reports increased pain with abduction and er/IR. Pt reports pain begins at anterior shoulder and shoots down arm to hand. Pt has notable improvements in ROM and strength, however continues to experience numbness and pain. Verbal cuing during session for correct form and technique.    Plan P: Mini-reassessment, continue with A/ROM and update HEP   OT Home Exercise Plan 8/15: table slides; 8/24: AA/ROM supine and grip strengthening   Consulted and Agree with Plan of Care Patient      Patient will benefit from skilled therapeutic intervention in order to improve the following deficits and impairments:  Decreased strength, Decreased activity tolerance, Impaired flexibility, Impaired sensation, Pain, Decreased range of motion, Increased fascial restricitons, Impaired UE functional use  Visit Diagnosis: Acute pain of left shoulder  Stiffness of left shoulder, not elsewhere classified  Other symptoms and signs involving the musculoskeletal system    Problem List Patient Active Problem List   Diagnosis Date Noted  . Impingement syndrome of left shoulder   . 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 Gastroesophageal reflux disease without esophagitis 12/26/2016  . Grief at loss of child 11/09/2016  . Sternal fracture 10/06/2015  . Contusion of leg 10/06/2015  . Migraine headache 07/24/2013  . Insomnia 05/30/2013  . Fibromyalgia 05/30/2013   Guadelupe Sabin, OTR/L  [phone] 08/16/2017, 5:01 PM  [place] [address] [place], [place], [address] Phone: [phone]   Fax:  [phone]  Name: Nichole Brown MRN: [id number] Date of Birth: May 21, 1966

## 2017-08-17 ENCOUNTER — Telehealth (INDEPENDENT_AMBULATORY_CARE_PROVIDER_SITE_OTHER): Payer: Self-pay | Admitting: *Deleted

## 2017-08-17 DIAGNOSIS — M5384 Other specified dorsopathies, thoracic region: Secondary | ICD-10-CM | POA: Diagnosis not present

## 2017-08-17 DIAGNOSIS — M9904 Segmental and somatic dysfunction of sacral region: Secondary | ICD-10-CM | POA: Diagnosis not present

## 2017-08-17 DIAGNOSIS — M9903 Segmental and somatic dysfunction of lumbar region: Secondary | ICD-10-CM | POA: Diagnosis not present

## 2017-08-17 DIAGNOSIS — M9902 Segmental and somatic dysfunction of thoracic region: Secondary | ICD-10-CM | POA: Diagnosis not present

## 2017-08-17 DIAGNOSIS — M9905 Segmental and somatic dysfunction of pelvic region: Secondary | ICD-10-CM | POA: Diagnosis not present

## 2017-08-17 DIAGNOSIS — Q72812 Congenital shortening of left lower limb: Secondary | ICD-10-CM | POA: Diagnosis not present

## 2017-08-17 DIAGNOSIS — M50122 Cervical disc disorder at C5-C6 level with radiculopathy: Secondary | ICD-10-CM | POA: Diagnosis not present

## 2017-08-17 DIAGNOSIS — M5137 Other intervertebral disc degeneration, lumbosacral region: Secondary | ICD-10-CM | POA: Diagnosis not present

## 2017-08-17 DIAGNOSIS — M9901 Segmental and somatic dysfunction of cervical region: Secondary | ICD-10-CM | POA: Diagnosis not present

## 2017-08-17 NOTE — Telephone Encounter (Signed)
Pt has appt scheduled for MRI CSP at [facility] on Thurs Sept 13 at 1pm, pt is to arrive 15 mins early to register, I C left message on vm to return my call for appt information

## 2017-08-18 ENCOUNTER — Telehealth (HOSPITAL_COMMUNITY): Payer: Self-pay | Admitting: Occupational Therapy

## 2017-08-18 ENCOUNTER — Ambulatory Visit (HOSPITAL_COMMUNITY): Payer: 59 | Admitting: Occupational Therapy

## 2017-08-18 NOTE — Telephone Encounter (Signed)
Called pt regarding missed appointment on 9/7 at 3:15. Left message and reminded pt of next appt on 9/12 at 3:15.    Guadelupe Sabin, OTR/L  [phone] 08/18/2017

## 2017-08-18 NOTE — Telephone Encounter (Signed)
Called again this am and left vm to return call for appt information

## 2017-08-18 NOTE — Telephone Encounter (Signed)
Called left message on vm for pt to return call

## 2017-08-20 DIAGNOSIS — R55 Syncope and collapse: Secondary | ICD-10-CM | POA: Diagnosis not present

## 2017-08-20 DIAGNOSIS — R42 Dizziness and giddiness: Secondary | ICD-10-CM | POA: Diagnosis not present

## 2017-08-20 DIAGNOSIS — R51 Headache: Secondary | ICD-10-CM | POA: Diagnosis not present

## 2017-08-20 DIAGNOSIS — R404 Transient alteration of awareness: Secondary | ICD-10-CM | POA: Diagnosis not present

## 2017-08-22 DIAGNOSIS — M9901 Segmental and somatic dysfunction of cervical region: Secondary | ICD-10-CM | POA: Diagnosis not present

## 2017-08-22 DIAGNOSIS — M9905 Segmental and somatic dysfunction of pelvic region: Secondary | ICD-10-CM | POA: Diagnosis not present

## 2017-08-22 DIAGNOSIS — M9903 Segmental and somatic dysfunction of lumbar region: Secondary | ICD-10-CM | POA: Diagnosis not present

## 2017-08-22 DIAGNOSIS — Q72812 Congenital shortening of left lower limb: Secondary | ICD-10-CM | POA: Diagnosis not present

## 2017-08-22 DIAGNOSIS — M5137 Other intervertebral disc degeneration, lumbosacral region: Secondary | ICD-10-CM | POA: Diagnosis not present

## 2017-08-22 DIAGNOSIS — M50122 Cervical disc disorder at C5-C6 level with radiculopathy: Secondary | ICD-10-CM | POA: Diagnosis not present

## 2017-08-22 DIAGNOSIS — M5384 Other specified dorsopathies, thoracic region: Secondary | ICD-10-CM | POA: Diagnosis not present

## 2017-08-22 DIAGNOSIS — M9902 Segmental and somatic dysfunction of thoracic region: Secondary | ICD-10-CM | POA: Diagnosis not present

## 2017-08-22 DIAGNOSIS — M9904 Segmental and somatic dysfunction of sacral region: Secondary | ICD-10-CM | POA: Diagnosis not present

## 2017-08-23 ENCOUNTER — Ambulatory Visit (HOSPITAL_COMMUNITY): Payer: 59 | Admitting: Occupational Therapy

## 2017-08-24 ENCOUNTER — Telehealth (INDEPENDENT_AMBULATORY_CARE_PROVIDER_SITE_OTHER): Payer: Self-pay | Admitting: Family

## 2017-08-24 ENCOUNTER — Ambulatory Visit (HOSPITAL_COMMUNITY)
Admission: RE | Admit: 2017-08-24 | Discharge: 2017-08-24 | Disposition: A | Discharge status: Home / Self Care | Payer: 59 | Source: Ambulatory Visit | Attending: Family | Admitting: Family

## 2017-08-24 ENCOUNTER — Other Ambulatory Visit (INDEPENDENT_AMBULATORY_CARE_PROVIDER_SITE_OTHER): Payer: Self-pay | Admitting: Family

## 2017-08-24 DIAGNOSIS — M50322 Other cervical disc degeneration at C5-C6 level: Secondary | ICD-10-CM | POA: Insufficient documentation

## 2017-08-24 DIAGNOSIS — M542 Cervicalgia: Secondary | ICD-10-CM | POA: Diagnosis not present

## 2017-08-24 DIAGNOSIS — M4802 Spinal stenosis, cervical region: Secondary | ICD-10-CM | POA: Diagnosis not present

## 2017-08-24 NOTE — Telephone Encounter (Signed)
Nichole Brown advised that after reviewing MRI, patient would get epidural injection with Dr. Ernestina Patches. They will call to schedule this for the patient.

## 2017-08-24 NOTE — Telephone Encounter (Signed)
Patient called advised she was at church Sunday and her arm went numb and she passed out. Patient was taken to the hospital where test were done. Patient said she had MRI this morning. Patient said she went to work Monday and Tuesday. Patient said she worked a half day yesterday but, did not go to work today due to the dizziness and pain in her arm. Patient said she's got a headache today. The number to contact patient is [phone]

## 2017-08-25 ENCOUNTER — Ambulatory Visit (HOSPITAL_COMMUNITY): Payer: 59

## 2017-08-25 ENCOUNTER — Encounter: Payer: Self-pay | Admitting: Nurse Practitioner

## 2017-08-25 ENCOUNTER — Encounter (HOSPITAL_COMMUNITY): Payer: Self-pay

## 2017-08-25 ENCOUNTER — Ambulatory Visit (INDEPENDENT_AMBULATORY_CARE_PROVIDER_SITE_OTHER): Payer: 59 | Admitting: Nurse Practitioner

## 2017-08-25 VITALS — BP 118/78 | Ht 62.0 in | Wt 166.2 lb

## 2017-08-25 DIAGNOSIS — R29898 Other symptoms and signs involving the musculoskeletal system: Secondary | ICD-10-CM | POA: Diagnosis not present

## 2017-08-25 DIAGNOSIS — R55 Syncope and collapse: Secondary | ICD-10-CM | POA: Diagnosis not present

## 2017-08-25 DIAGNOSIS — M25512 Pain in left shoulder: Secondary | ICD-10-CM

## 2017-08-25 DIAGNOSIS — J300 Vasomotor rhinitis: Secondary | ICD-10-CM

## 2017-08-25 DIAGNOSIS — I950 Idiopathic hypotension: Secondary | ICD-10-CM

## 2017-08-25 DIAGNOSIS — M25612 Stiffness of left shoulder, not elsewhere classified: Secondary | ICD-10-CM | POA: Diagnosis not present

## 2017-08-25 NOTE — Patient Instructions (Addendum)
Nasacort AQ as directed Meclizine 25 mg every 6 hours as needed

## 2017-08-25 NOTE — Therapy (Signed)
[facility] [address] [place], [place], [address] Phone: [phone]   Fax:  [phone]  Occupational Therapy Treatment  Patient Details  Name: Nichole Brown MRN: [id number] Date of Birth: 1966-03-12 Referring Provider: Dondra Prader, NP  Encounter Date: 08/25/2017      OT End of Session - 08/25/17 1640    Visit Number 8   Number of Visits 16   Date for OT Re-Evaluation 09/24/17   Authorization Type UMR; $20 copay   OT Start Time 1515   OT Stop Time 1600   OT Time Calculation (min) 45 min   Activity Tolerance Patient tolerated treatment well   Behavior During Therapy [facility] for tasks assessed/performed      Past Medical History:  Diagnosis Date  . Anxiety   . Bronchitis   . Depression   . Fibromyalgia 2005  . GERD (gastroesophageal reflux disease)   . Insomnia   . Migraine headache   . Pneumonia     Past Surgical History:  Procedure Laterality Date  . ABDOMINAL HYSTERECTOMY    . CESAREAN SECTION    . CHOLECYSTECTOMY  1988  . open heart surgery  1967  . PATENT DUCTUS ARTERIOUS REPAIR  1967  . SHOULDER ARTHROSCOPY Left 06/16/2017   Procedure: LEFT SHOULDER ARTHROSCOPY, DEBRIDEMENT, AND DECOMPRESSION;  Surgeon: Newt Minion, MD;  Location: [place];  Service: Orthopedics;  Laterality: Left;  . SHOULDER SURGERY Left 2008   AC joint    There were no vitals filed for this visit.      Subjective Assessment - 08/25/17 1639    Subjective  S: I'm prone to passing out. The pain was so bad.    Currently in Pain? Yes   Pain Score 8    Pain Location Shoulder   Pain Orientation Left   Pain Descriptors / Indicators Aching;Constant   Pain Type Acute pain   Pain Radiating Towards elbow   Pain Onset More than a month ago   Pain Frequency Constant   Aggravating Factors  movement   Pain Relieving Factors heat, pain medication   Effect of Pain on Daily Activities max   Multiple Pain Sites No            [facility] OT  Assessment - 08/25/17 1522      Assessment   Diagnosis S/P Left shoulder arthroscopy-debridement and decompression   Onset Date 06/16/17     Precautions   Precautions None     Prior Function   Level of Independence Independent     AROM   Overall AROM Comments Assessed seated, er/IR adducted   AROM Assessment Site Shoulder   Right/Left Shoulder Left   Left Shoulder Flexion 150 Degrees  previous: 135   Left Shoulder ABduction 160 Degrees  previous: 145   Left Shoulder Internal Rotation 90 Degrees  previous: same   Left Shoulder External Rotation 66 Degrees  previous: 60     PROM   Overall PROM Comments Assessed supine, er/IR adducted   PROM Assessment Site Shoulder   Right/Left Shoulder Left   Left Shoulder Flexion 180 Degrees  previous: 135   Left Shoulder ABduction 180 Degrees  previous; same   Left Shoulder Internal Rotation 90 Degrees  previous: same   Left Shoulder External Rotation 50 Degrees  previous: 35     Strength   Overall Strength Comments Assessed seated, er/IR adducted   Strength Assessment Site Shoulder   Right/Left Shoulder Left   Left Shoulder Flexion 4/5  previous: 3/5   Left Shoulder ABduction 4/5  previous: 3/5   Left Shoulder Internal Rotation 5/5  previous: 3/5   Left Shoulder External Rotation 4+/5  previous: 3/5                  OT Treatments/Exercises (OP) - 08/25/17 1554      Exercises   Exercises Shoulder     Shoulder Exercises: Supine   Protraction PROM;5 reps   Horizontal ABduction PROM;5 reps   External Rotation PROM;5 reps   Internal Rotation PROM;5 reps   Flexion PROM;5 reps   ABduction PROM;5 reps     Modalities   Modalities Electrical Stimulation;Moist Heat     Moist Heat Therapy   Number Minutes Moist Heat 10 Minutes   Moist Heat Location Shoulder     Electrical Stimulation   Electrical Stimulation Location left upper arm   Electrical Stimulation Action interferential   Electrical Stimulation  Parameters 6.4 CV   Electrical Stimulation Goals Pain     Manual Therapy   Manual Therapy Myofascial release   Manual therapy comments Manual therapy completed prior to exercises.   Myofascial Release Myofascial release and manual stretching completed to left upper arm, trapezius, and scapularis region to decrease fascial restrictions and increase ROM in a pain free zone.                   OT Short Term Goals - 08/25/17 1640      OT SHORT TERM GOAL #1   Title Pt will be educated on and independent in HEP to improve functional use of LUE during daily tasks.    Time 4   Period Weeks   Status Achieved     OT SHORT TERM GOAL #2   Title Pt will improve LUE P/ROM to WNL to increase ability to use LUE as assist during dressing tasks.    Time 4   Period Weeks   Status Achieved     OT SHORT TERM GOAL #3   Title Pt will decrease pain in LUE to 5/10 to improve ability to sleep at night.    Time 4   Period Weeks   Status On-going     OT SHORT TERM GOAL #4   Title Pt will improve LUE strength to 4-/5 to increase ability to use LUE as assist when driving.    Time 4   Period Weeks   Status Partially Met     OT SHORT TERM GOAL #5   Title Patient will increase left grip strength by 20 pounds and pinch strength by 3 pounds for improved ability to maintain grasp on items when cooking.     Time 4   Period Weeks   Status On-going           OT Long Term Goals - 07/31/17 4098      OT LONG TERM GOAL #1   Title Pt will return to highest level of functioning during B/IADL tasks using LUE as non-dominant.    Time 8   Period Weeks   Status On-going     OT LONG TERM GOAL #2   Title Pt will decrease LUE pain to 3/10 or less to improve ability to use LUE during grooming tasks such as washing hair.    Time 8   Period Weeks   Status On-going     OT LONG TERM GOAL #3   Title Pt will decrease fascial restrictions in LUE from mod/max to minimal amounts to improve  mobility required  for functional reaching tasks.    Time 8   Period Weeks   Status On-going     OT LONG TERM GOAL #4   Title Pt will improve LUE A/ROM to [facility] to increase ability to perform overhead reaching tasks at home and work.    Time 8   Period Weeks   Status On-going     OT LONG TERM GOAL #5   Title Pt will improve LUE strength to 4+/5 to increase ability to pull/push heavy cart at work.    Time 8   Period Weeks   Status On-going     Long Term Additional Goals   Additional Long Term Goals Yes     OT LONG TERM GOAL #6   Title Patient will improve left grip strength to 50# or more and pinch strength to 8 pounds or more for improved ability to grasp items at work.    Time 8   Period Weeks   Status New   Target Date 09/25/17               Plan - 08/25/17 1640    Clinical Impression Statement A: Mini reassessment completed this date. patient has met 2 short term goals at this time. Strength and ROM have greatly improved overall. Pt presents with incresed pain reporting that it feels as if the muscle is being ripped off the bone during treatment. Patient has increased difficulty with sleep due to the pain level. Pt did have good results with TENS unit for pain reporting that pain was a 5/10 when leaving session.     Plan P: Assess grip and pinch strength. Continue A/ROM and update HEP. Follow up on pain.      Patient will benefit from skilled therapeutic intervention in order to improve the following deficits and impairments:  Decreased strength, Decreased activity tolerance, Impaired flexibility, Impaired sensation, Pain, Decreased range of motion, Increased fascial restricitons, Impaired UE functional use  Visit Diagnosis: Acute pain of left shoulder  Stiffness of left shoulder, not elsewhere classified  Other symptoms and signs involving the musculoskeletal system    Problem List Patient Active Problem List   Diagnosis Date Noted  . Impingement syndrome of left shoulder   .  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 Gastroesophageal reflux disease without esophagitis 12/26/2016  . Grief at loss of child 11/09/2016  . Sternal fracture 10/06/2015  . Contusion of leg 10/06/2015  . Migraine headache 07/24/2013  . Insomnia 05/30/2013  . Fibromyalgia 05/30/2013   Ailene Ravel, OTR/L,CBIS  [phone]  08/25/2017, 4:44 PM  [facility] [address] [place], [place], [address] Phone: [phone]   Fax:  [phone]  Name: KAYSEN SEFCIK MRN: [id number] Date of Birth: 1966/09/28

## 2017-08-26 ENCOUNTER — Encounter: Payer: Self-pay | Admitting: Nurse Practitioner

## 2017-08-26 NOTE — Progress Notes (Signed)
Subjective:  Presents for recheck after ED visit in [place] on 9/9. Records not available during office visit. Had left shoulder surgery on 7/6. Started rehab recently which has increased her pain. On 9/9, she experienced a sharp pain when she moved her left shoulder; became clammy sweaty and light headed. People told her she stood up at church then passed out. Her daughter is a Marine scientist. No seizure activity noted. No injury from fall. Went to local ED. According to patient, she had CT scan of the head, xrays and labs all of which were normal. No further episodes of syncope. Having some mild dizziness at times. No fever. Ear pressure. Occasional sinus pressure but no headache at this time. No sore throat or cough. Continues follow up with her neurologist for headaches. Had a recent MRI of her neck and will be discussing referral to a neurosurgeon this week. States her BP usually runs low around 916 systolic.   Objective:   BP 118/78   Ht 5\' 2"  (1.575 m)   Wt 166 lb 3.2 oz (75.4 kg)   BMI 30.40 kg/m  NAD. Alert, oriented. TMs retracted, no erythema. Pharynx non erythematous with cloudy PND noted. Neck supple with mild anterior adenopathy. Lungs clear. Heart RRR. BP sitting left arm 102/70 and standing 98/72. Gait steady.   Assessment:  Idiopathic hypotension  Vasomotor rhinitis  Vasovagal syncope    Plan:  Avoid sudden position changes. Recommend hydration and regular meals to avoid hypotension. Syncopal episode most likely vasovagal reaction to sudden intense pain. Patient to remain seated if this happens again. Start daily antihistamine and steroid nasal spray. Meclizine as directed for dizziness. Warning signs reviewed. Call back if worsens or persists. ROR for [facility] ED.

## 2017-08-28 ENCOUNTER — Other Ambulatory Visit: Payer: Self-pay | Admitting: Nurse Practitioner

## 2017-08-28 NOTE — Telephone Encounter (Signed)
Last seen 08/25/17

## 2017-08-29 ENCOUNTER — Ambulatory Visit (INDEPENDENT_AMBULATORY_CARE_PROVIDER_SITE_OTHER): Payer: 59 | Admitting: Family

## 2017-08-30 ENCOUNTER — Ambulatory Visit (HOSPITAL_COMMUNITY): Payer: 59 | Admitting: Occupational Therapy

## 2017-09-01 ENCOUNTER — Telehealth (HOSPITAL_COMMUNITY): Payer: Self-pay | Admitting: Family Medicine

## 2017-09-01 ENCOUNTER — Ambulatory Visit (HOSPITAL_COMMUNITY): Payer: 59 | Admitting: Occupational Therapy

## 2017-09-01 NOTE — Telephone Encounter (Signed)
09/01/17  pt said that with her working and everything she doesn't think she can handle therapy today

## 2017-09-05 ENCOUNTER — Ambulatory Visit (INDEPENDENT_AMBULATORY_CARE_PROVIDER_SITE_OTHER): Payer: 59 | Admitting: Physical Medicine and Rehabilitation

## 2017-09-05 ENCOUNTER — Encounter (INDEPENDENT_AMBULATORY_CARE_PROVIDER_SITE_OTHER): Payer: Self-pay | Admitting: Physical Medicine and Rehabilitation

## 2017-09-05 VITALS — BP 119/67 | HR 83

## 2017-09-05 DIAGNOSIS — M5412 Radiculopathy, cervical region: Secondary | ICD-10-CM | POA: Diagnosis not present

## 2017-09-05 MED ORDER — DIAZEPAM 5 MG PO TABS: ORAL_TABLET | ORAL | 0 refills | Status: DC

## 2017-09-05 NOTE — Progress Notes (Deleted)
Neck and arm pain- worse on left. Numbness and tingling down left arm.Worse with activities such as opening a door, pushing, leaning.

## 2017-09-06 ENCOUNTER — Ambulatory Visit (HOSPITAL_COMMUNITY): Payer: 59

## 2017-09-07 ENCOUNTER — Encounter (INDEPENDENT_AMBULATORY_CARE_PROVIDER_SITE_OTHER): Payer: Self-pay

## 2017-09-07 ENCOUNTER — Encounter (INDEPENDENT_AMBULATORY_CARE_PROVIDER_SITE_OTHER): Payer: Self-pay | Admitting: Physical Medicine and Rehabilitation

## 2017-09-07 ENCOUNTER — Ambulatory Visit (INDEPENDENT_AMBULATORY_CARE_PROVIDER_SITE_OTHER): Payer: 59

## 2017-09-07 ENCOUNTER — Ambulatory Visit (INDEPENDENT_AMBULATORY_CARE_PROVIDER_SITE_OTHER): Payer: 59 | Admitting: Physical Medicine and Rehabilitation

## 2017-09-07 VITALS — BP 113/75 | HR 69

## 2017-09-07 DIAGNOSIS — M5412 Radiculopathy, cervical region: Secondary | ICD-10-CM | POA: Diagnosis not present

## 2017-09-07 MED ORDER — BETAMETHASONE SOD PHOS & ACET 6 (3-3) MG/ML IJ SUSP
12.0000 mg | Freq: Once | INTRAMUSCULAR | Status: AC
Start: 2017-09-07 — End: 2017-09-07
  Administered 2017-09-07: 12 mg

## 2017-09-07 MED ORDER — LIDOCAINE HCL (PF) 1 % IJ SOLN
2.0000 mL | Freq: Once | INTRAMUSCULAR | Status: AC
  Administered 2017-09-07: 2 mL

## 2017-09-07 NOTE — Progress Notes (Deleted)
Patient is here for planned injection. No change in symptoms.

## 2017-09-07 NOTE — Patient Instructions (Signed)

## 2017-09-08 ENCOUNTER — Ambulatory Visit (HOSPITAL_COMMUNITY): Payer: 59

## 2017-09-08 ENCOUNTER — Telehealth (HOSPITAL_COMMUNITY): Payer: Self-pay

## 2017-09-08 NOTE — Telephone Encounter (Signed)
Called patient and left message regarding no show. Informed patient that due to our attendance policy she can only make one appointment at time. Patient will be discharged she has another no show. Reminded patient of her next appointment and to call if she can not make it.  Ailene Ravel, OTR/L,CBIS  [phone]

## 2017-09-10 NOTE — Procedures (Signed)
[name] is a 51 year old female that comes in today for planned left C7-T1 interlaminar epidural steroid injection. Please see our prior evaluation and management note for further details and justification.  Cervical Epidural Steroid Injection - Interlaminar Approach with Fluoroscopic Guidance  Patient: Nichole Brown      Date of Birth: 1966-06-19 MRN: [id number] PCP: Mikey Kirschner, MD      Visit Date: 09/07/2017   Universal Protocol:    Date/Time: 09/12/1811:00 AM  Consent Given By: the patient  Position: PRONE  Additional Comments: Vital signs were monitored before and after the procedure. Patient was prepped and draped in the usual sterile fashion. The correct patient, procedure, and site was verified.   Injection Procedure Details:  Procedure Site One Meds Administered:  Meds ordered this encounter  Medications  . lidocaine (PF) (XYLOCAINE) 1 % injection 2 mL  . betamethasone acetate-betamethasone sodium phosphate (CELESTONE) injection 12 mg     Laterality: Left  Location/Site:  C7-T1  Needle size: 20 G  Needle type: Touhy  Needle Placement: Paramedian epidural space  Findings:  -Contrast Used: 1 mL iohexol 180 mg iodine/mL   -Comments: Excellent flow of contrast into the epidural space.  Procedure Details: Using a paramedian approach from the side mentioned above, the region overlying the inferior lamina was localized under fluoroscopic visualization and the soft tissues overlying this structure were infiltrated with 4 ml. of 1% Lidocaine without Epinephrine. A # 20 gauge, Tuohy needle was inserted into the epidural space using a paramedian approach.  The epidural space was localized using loss of resistance along with lateral and contralateral oblique bi-planar fluoroscopic views.  After negative aspirate for air, blood, and CSF, a 2 ml. volume of Isovue-250 was injected into the epidural space and the flow of contrast was observed. Radiographs were  obtained for documentation purposes.   The injectate was administered into the level noted above.  Additional Comments:  The patient tolerated the procedure well Dressing: Band-Aid    Post-procedure details: Patient was observed during the procedure. Post-procedure instructions were reviewed.  Patient left the clinic in stable condition.

## 2017-09-12 ENCOUNTER — Telehealth (INDEPENDENT_AMBULATORY_CARE_PROVIDER_SITE_OTHER): Payer: Self-pay | Admitting: Physical Medicine and Rehabilitation

## 2017-09-12 ENCOUNTER — Encounter (INDEPENDENT_AMBULATORY_CARE_PROVIDER_SITE_OTHER): Payer: Self-pay | Admitting: Physical Medicine and Rehabilitation

## 2017-09-12 NOTE — Telephone Encounter (Signed)
Patient has some questions about an injection she received the other day from Dr. Ernestina Patches. Really wanting to speak with you and ask you some questions. CB#  [phone] if you can't get back with her today you can reach her on her work number [phone] ext:1 and ask for [employer]. Thank you

## 2017-09-12 NOTE — Telephone Encounter (Signed)
Called patient to advise. She will give it a little more time. If she is still having a lot of pain she will call and see what Dondra Prader recommends.

## 2017-09-12 NOTE — Telephone Encounter (Signed)
If it helped a lot, including the whole hand numbness then she could repeat x1 injection, or have Erin order EMG/NCV by neurology or Erin could get spine/neurosurgery referral.  Alternatively PT with Dry needling if not done but I think she has had.

## 2017-09-12 NOTE — Progress Notes (Signed)
Nichole Brown - 51 y.o. female MRN [id number]  Date of birth: 1966/03/16  Office Visit Note: Visit Date: 09/05/2017 PCP: Mikey Kirschner, MD Referred by: Mikey Kirschner, MD  Subjective: Chief Complaint  Patient presents with  . Neck - Pain   HPI: Nichole Brown is a pleasant 51 year old right-hand dominant female who works as a Char[employer] fundraiser who is been having chronic left neck and shoulder and arm pain for many months. She is status post left shoulder arthroscopy on July 6 for shoulder impingement. She continued to have left neck shoulder and arm pain after this point and was followed by Dr. Sharol Given and Dondra Prader, [place].  She continues with physical therapy for her neck and shoulder. She has failed medication management with anti-inflammatories and pain medications. She reports chronic worsening neck and arm pain. There is some numbness and tingling in the left arm down to the dorsal forearm and radial side of the hand. She gets worsening shoulder pain with activities such as opening a door pushing or leaning. She has some right-sided neck pain as well. She has no pain down the right arm. She reports feeling weakness but no focal weakness. She is very frustrated at this point with the amount of pain that she's having. MRI of the cervical spine was obtained and reviewed below. This does show multilevel facet arthropathy with foraminal narrowing and disc osteophyte at C5-6. Her case is complicated by history of migraine with aura as well as fibromyalgia. She's had no prior cervical spine surgery or injections.    Review of Systems  Constitutional: Negative for chills, fever, malaise/fatigue and weight loss.  HENT: Negative for hearing loss and sinus pain.   Eyes: Negative for blurred vision, double vision and photophobia.  Respiratory: Negative for cough and shortness of breath.   Cardiovascular: Negative for chest pain, palpitations and leg swelling.  Gastrointestinal: Negative for abdominal  pain, nausea and vomiting.  Genitourinary: Negative for flank pain.  Musculoskeletal: Positive for joint pain and neck pain. Negative for myalgias.  Skin: Negative for itching and rash.  Neurological: Negative for tremors, focal weakness and weakness.  Endo/Heme/Allergies: Negative.   Psychiatric/Behavioral: Negative for depression.  All other systems reviewed and are negative.  Otherwise per HPI.  Assessment & Plan: Visit Diagnoses:  1. Cervical radiculopathy     Plan: Findings:  Chronic worsening severe left neck shoulder and arm pain with paresthesias somewhat consistent with a radicular pattern. MRI evidence of cervical stenosis of foraminal standpoint at C5-6. She is multilevel facet arthropathy. Her symptoms have not been totally relieved with shoulder arthroscopy. She likely has 2 factors mainly which may be cervical radiculitis and issues intrinsically with the shoulder. This is also complicated by history fibromyalgia and myofascial pain. I think at this point would warrant cervical epidural injection. Over the risk and benefits of this and she does want to proceed. We have her preprocedure Valium due to preprocedure anxiety. We'll see her back for the injection.    Meds & Orders:  Meds ordered this encounter  Medications  . diazepam (VALIUM) 5 MG tablet    Sig: Take 1 by mouth 1 to 2 hours pre-procedure. May repeat if necessary.    Dispense:  2 tablet    Refill:  0   No orders of the defined types were placed in this encounter.   Follow-up: Return for C7-T1 interlaminar epidural steroid injection.   Procedures: No procedures performed  No notes on file   Clinical History: MRI  CERVICAL SPINE WITHOUT CONTRAST  TECHNIQUE: Multiplanar, multisequence MR imaging of the cervical spine was performed. No intravenous contrast was administered.  COMPARISON:  Cervical spine CT 10/03/2015.  Brain MRI 08/06/2015.  FINDINGS: Alignment: Improved cervical lordosis compared to  the 2016 CT. Mild retrolisthesis of C5 on C6.  Vertebrae: Benign vertebral body hemangioma incidentally noted in T2. Benign sclerotic appearing probable fibrous lesion in the right C7 vertebra appears unchanged since 2016 (series 4, image 3). Elsewhere Visualized bone marrow signal is within normal limits. No marrow edema or evidence of acute osseous abnormality.  Cord: Spinal cord signal is within normal limits at all visualized levels.  Posterior Fossa, vertebral arteries, paraspinal tissues: Cervicomedullary junction is within normal limits. Negative visualized brain parenchyma. Preserved major vascular flow voids in the neck. Negative neck soft tissues.  Disc levels:  C2-C3: Moderate chronic left facet hypertrophy. Stable mild to moderate left C3 foraminal stenosis since 2016.  C3-C4: Mild to moderate bilateral facet hypertrophy is chronic. Stable mild right C4 foraminal stenosis.  C4-C5:  Mild chronic facet hypertrophy.  No stenosis.  C5-C6: Chronic disc space loss. Circumferential disc bulge and mild endplate spurring with broad-based posterior component. Left greater than right foraminal disc and/or endplate spurring. Borderline to mild spinal stenosis. Mild to moderate left and borderline to mild right C6 neural foraminal stenosis.  C6-C7: Mild disc space loss. Circumferential disc bulge with broad-based posterior component. No stenosis.  C7-T1: Moderate to severe left and mild-to-moderate right facet hypertrophy is chronic. Mild left C8 foraminal stenosis has not significantly changed.  No upper thoracic spinal or foraminal stenosis.  IMPRESSION: 1.  No acute osseous abnormality. 2. Multilevel chronic cervical facet degeneration, greater on the left. Predominately mild associated cervical neural foraminal stenosis. 3. Chronic disc and endplate degeneration at C5-C6 with up to moderate left C6 neural foraminal stenosis, borderline to mild spinal  and right foraminal stenosis.   Electronically Signed   By: Genevie Ann M.D.   On: 08/24/2017 08:26  She reports that she has never smoked. She has never used smokeless tobacco. No results for input(s): HGBA1C, LABURIC in the last 8760 hours.  Objective:  VS:  HT:    WT:   BMI:     BP:119/67  HR:83bpm  TEMP: ( )  RESP:  Physical Exam  Constitutional: She is oriented to person, place, and time. She appears well-developed and well-nourished. No distress.  HENT:  Head: Normocephalic and atraumatic.  Nose: Nose normal.  Mouth/Throat: Oropharynx is clear and moist.  Eyes: Pupils are equal, round, and reactive to light. Conjunctivae are normal.  Neck: Neck supple. No tracheal deviation present.  Cardiovascular: Regular rhythm and intact distal pulses.   Pulmonary/Chest: Effort normal. No respiratory distress.  Abdominal: She exhibits no distension. There is no guarding.  Musculoskeletal:  Patient sits with a somewhat forward flexed cervical spine. She does have pain with left rotation more than right and with extension. She has a negative Spurling's test bilaterally. She does have some trigger points in the levator scapula and infraspinatus on the left more than right. She has some mild shoulder impingement signs on the left. She has good strength bilaterally particularly with long finger flexion and APB and wrist extension. She has 2+ muscle stretch reflexes at the biceps and brachioradialis bilaterally. She has a negative Hoffmann's test.  Lymphadenopathy:    She has no cervical adenopathy.  Neurological: She is alert and oriented to person, place, and time. She exhibits normal muscle tone. Coordination normal.  Skin: Skin is warm. No rash noted. No erythema.  Psychiatric: She has a normal mood and affect. Her behavior is normal.  Nursing note and vitals reviewed.   Ortho Exam Imaging: No results found.  Past Medical/Family/Surgical/Social History: Medications & Allergies reviewed  per EMR Patient Active Problem List   Diagnosis Date Noted  . Impingement syndrome of left shoulder   . 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 Gastroesophageal reflux disease without esophagitis 12/26/2016  . Grief at loss of child 11/09/2016  . Sternal fracture 10/06/2015  . Contusion of leg 10/06/2015  . Migraine headache 07/24/2013  . Insomnia 05/30/2013  . Fibromyalgia 05/30/2013   Past Medical History:  Diagnosis Date  . Anxiety   . Bronchitis   . Depression   . Fibromyalgia 2005  . GERD (gastroesophageal reflux disease)   . Insomnia   . Migraine headache   . Pneumonia    Family History  Problem Relation Age of Onset  . Hypertension Maternal Grandmother   . Diabetes Maternal Grandmother   . Heart disease Maternal Grandmother 40       MI  . Hypertension Maternal Grandfather   . Throat cancer Maternal Grandfather   . Breast cancer Mother 72  . Hypertension Father    Past Surgical History:  Procedure Laterality Date  . ABDOMINAL HYSTERECTOMY    . CESAREAN SECTION    . CHOLECYSTECTOMY  1988  . open heart surgery  1967  . PATENT DUCTUS ARTERIOUS REPAIR  1967  . SHOULDER ARTHROSCOPY Left 06/16/2017   Procedure: LEFT SHOULDER ARTHROSCOPY, DEBRIDEMENT, AND DECOMPRESSION;  Surgeon: Newt Minion, MD;  Location: [facility];  Service: Orthopedics;  Laterality: Left;  . SHOULDER SURGERY Left 2008   [facility] joint   Social History   Occupational History  . Not on file.   Social History Main Topics  . Smoking status: Never Smoker  . Smokeless tobacco: Never Used  . Alcohol use Yes     Comment: occassionally  . Drug use: No  . Sexual activity: Yes    Birth control/ protection: Surgical

## 2017-09-12 NOTE — Telephone Encounter (Signed)
Patient had a C7-T1 IL 09/07/17. She had some relilef for a short time, but she said she is having pain now and her "whole arm is numb." Please advise.

## 2017-09-13 ENCOUNTER — Encounter (HOSPITAL_COMMUNITY): Payer: Self-pay

## 2017-09-13 ENCOUNTER — Ambulatory Visit (HOSPITAL_COMMUNITY): Payer: 59 | Attending: Family

## 2017-09-13 ENCOUNTER — Other Ambulatory Visit (INDEPENDENT_AMBULATORY_CARE_PROVIDER_SITE_OTHER): Payer: Self-pay | Admitting: Family

## 2017-09-13 ENCOUNTER — Telehealth (INDEPENDENT_AMBULATORY_CARE_PROVIDER_SITE_OTHER): Payer: Self-pay | Admitting: Physical Medicine and Rehabilitation

## 2017-09-13 DIAGNOSIS — R29898 Other symptoms and signs involving the musculoskeletal system: Secondary | ICD-10-CM | POA: Diagnosis not present

## 2017-09-13 DIAGNOSIS — M25612 Stiffness of left shoulder, not elsewhere classified: Secondary | ICD-10-CM | POA: Diagnosis not present

## 2017-09-13 DIAGNOSIS — M6281 Muscle weakness (generalized): Secondary | ICD-10-CM | POA: Diagnosis not present

## 2017-09-13 DIAGNOSIS — R200 Anesthesia of skin: Secondary | ICD-10-CM

## 2017-09-13 DIAGNOSIS — M62838 Other muscle spasm: Secondary | ICD-10-CM | POA: Insufficient documentation

## 2017-09-13 DIAGNOSIS — G8929 Other chronic pain: Secondary | ICD-10-CM | POA: Insufficient documentation

## 2017-09-13 DIAGNOSIS — M25512 Pain in left shoulder: Secondary | ICD-10-CM | POA: Insufficient documentation

## 2017-09-13 NOTE — Telephone Encounter (Signed)
Patient called this morning wanting to let you know that she is not any better.  Please call patient.  CB#[phone].  Thank you.

## 2017-09-13 NOTE — Therapy (Addendum)
[facility] [place], [place], [address] Phone: [phone]   Fax:  [phone]  Occupational Therapy Treatment  Patient Details  Name: Nichole Brown MRN: [id number] Date of Birth: [birth year] Referring Provider: Dondra Prader, NP  Encounter Date: 09/13/2017      OT End of Session - 09/13/17 1851    Visit Number 9   Number of Visits 16   Date for OT Re-Evaluation 09/24/17   Authorization Type [employer]; $20 copay   OT Start Time 1528  pt arrived late   OT Stop Time 1600   OT Time Calculation (min) 32 min   Activity Tolerance Patient tolerated treatment well   Behavior During Therapy [facility] for tasks assessed/performed      Past Medical History:  Diagnosis Date  . Anxiety   . Bronchitis   . Depression   . Fibromyalgia 2005  . GERD (gastroesophageal reflux disease)   . Insomnia   . Migraine headache   . Pneumonia     Past Surgical History:  Procedure Laterality Date  . ABDOMINAL HYSTERECTOMY    . CESAREAN SECTION    . CHOLECYSTECTOMY  1988  . open heart surgery  1967  . PATENT DUCTUS ARTERIOUS REPAIR  1967  . SHOULDER ARTHROSCOPY Left 06/16/2017   Procedure: LEFT SHOULDER ARTHROSCOPY, DEBRIDEMENT, AND DECOMPRESSION;  Surgeon: Newt Minion, MD;  Location: [place];  Service: Orthopedics;  Laterality: Left;  . SHOULDER SURGERY Left 2008   AC joint    There were no vitals filed for this visit.      Subjective Assessment - 09/13/17 1849    Subjective  S: The doctor mentioned something about therapy with needles he wanted me to try.   Currently in Pain? Yes   Pain Score 9    Pain Location Shoulder   Pain Orientation Left   Pain Descriptors / Indicators Aching;Burning;Constant   Pain Type Acute pain   Pain Radiating Towards elbow   Pain Onset More than a month ago   Pain Frequency Constant   Aggravating Factors  movement and working   Pain Relieving Factors heat, pain medication   Effect of Pain on Daily  Activities max   Multiple Pain Sites No            [facility] OT Assessment - 09/13/17 1850      Assessment   Diagnosis S/P Left shoulder arthroscopy-debridement and decompression     Precautions   Precautions None                  OT Treatments/Exercises (OP) - 09/13/17 1553      Exercises   Exercises Shoulder     Shoulder Exercises: Supine   Protraction PROM;5 reps;AROM;15 reps   Horizontal ABduction PROM;5 reps;AROM;15 reps   External Rotation PROM;5 reps;AROM;15 reps   Internal Rotation PROM;5 reps;AROM;15 reps   Flexion PROM;5 reps;AROM;15 reps   ABduction PROM;5 reps;AROM;15 reps     Modalities   Modalities Electrical Stimulation;Moist Heat     Moist Heat Therapy   Number Minutes Moist Heat 10 Minutes   Moist Heat Location Shoulder     Electrical Stimulation   Electrical Stimulation Location left upper arm   Electrical Stimulation Action interferential   Electrical Stimulation Parameters 6.2CV   Electrical Stimulation Goals Pain                OT Education - 09/13/17 1851    Education provided Yes   Education Details A/ROM shoulder  standing   Person(s) Educated Patient   Methods Explanation;Demonstration;Handout   Comprehension Verbalized understanding;Returned demonstration          OT Short Term Goals - 09/13/17 1854      OT SHORT TERM GOAL #1   Title Pt will be educated on and independent in HEP to improve functional use of LUE during daily tasks.    Time 4   Period Weeks     OT SHORT TERM GOAL #2   Title Pt will improve LUE P/ROM to WNL to increase ability to use LUE as assist during dressing tasks.    Time 4   Period Weeks     OT SHORT TERM GOAL #3   Title Pt will decrease pain in LUE to 5/10 to improve ability to sleep at night.    Time 4   Period Weeks   Status On-going     OT SHORT TERM GOAL #4   Title Pt will improve LUE strength to 4-/5 to increase ability to use LUE as assist when driving.    Time 4   Period  Weeks   Status Partially Met     OT SHORT TERM GOAL #5   Title Patient will increase left grip strength by 20 pounds and pinch strength by 3 pounds for improved ability to maintain grasp on items when cooking.     Time 4   Period Weeks   Status On-going           OT Long Term Goals - 09/13/17 1854      OT LONG TERM GOAL #1   Title Pt will return to highest level of functioning during B/IADL tasks using LUE as non-dominant.    Time 8   Period Weeks   Status On-going     OT LONG TERM GOAL #2   Title Pt will decrease LUE pain to 3/10 or less to improve ability to use LUE during grooming tasks such as washing hair.    Time 8   Period Weeks   Status On-going     OT LONG TERM GOAL #3   Title Pt will decrease fascial restrictions in LUE from mod/max to minimal amounts to improve mobility required for functional reaching tasks.    Time 8   Period Weeks   Status On-going     OT LONG TERM GOAL #4   Title Pt will improve LUE A/ROM to [facility] to increase ability to perform overhead reaching tasks at home and work.    Time 8   Period Weeks   Status On-going     OT LONG TERM GOAL #5   Title Pt will improve LUE strength to 4+/5 to increase ability to pull/push heavy cart at work.    Time 8   Period Weeks   Status On-going     OT LONG TERM GOAL #6   Title Patient will improve left grip strength to 50# or more and pinch strength to 8 pounds or more for improved ability to grasp items at work.    Time 8   Period Weeks   Status On-going               Plan - 09/13/17 1852    Clinical Impression Statement A: pt reports increase numbness that is very painful in her Left arm. Her doctor mentioned he would like her to try dry needling. OT will send a PT order to MD for dry needling and we will discharge her from OT in order  for her to be seen by PT for dry needling. Until that order comes through we will continue to see her for her LUE.    Plan P: Follow up on HEP. Complete A/ROM  standing and scapular theraband.      Patient will benefit from skilled therapeutic intervention in order to improve the following deficits and impairments:  Decreased strength, Decreased activity tolerance, Impaired flexibility, Impaired sensation, Pain, Decreased range of motion, Increased fascial restricitons, Impaired UE functional use  Visit Diagnosis: Acute pain of left shoulder  Stiffness of left shoulder, not elsewhere classified  Other symptoms and signs involving the musculoskeletal system    Problem List Patient Active Problem List   Diagnosis Date Noted  . Impingement syndrome of left shoulder   . 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 Gastroesophageal reflux disease without esophagitis 12/26/2016  . Grief at loss of child 11/09/2016  . Sternal fracture 10/06/2015  . Contusion of leg 10/06/2015  . Migraine headache 07/24/2013  . Insomnia 05/30/2013  . Fibromyalgia 05/30/2013   Ailene Ravel, OTR/L,CBIS  [phone]  09/13/2017, 6:55 PM  [facility] [address] [place], [place], [address] Phone: [phone]   Fax:  [phone]  Name: Nichole Brown MRN: [id number] Date of Birth: 1966-04-03   OCCUPATIONAL THERAPY DISCHARGE SUMMARY  Visits from Start of Care: 9  Current functional level related to goals / functional outcomes: Patient reports that MD would like her to try Dry Needling. Patient will be discharged from OT services and picked up by PT for dry needling.    Remaining deficits: Pt continues to have pain, fascial restrictions and decreased ROM and strength in left UE.   Education / Equipment: Patient was given a shoulder strengthening HEP.  Plan: Patient agrees to discharge.  Patient goals were not met. Patient is being discharged due to the patient's request.  ?????         Ailene Ravel, OTR/L,CBIS  [phone]

## 2017-09-13 NOTE — Telephone Encounter (Signed)
Left vm for patient advising Nichole Brown was making referral to neurology for consultation and emg/ncv visit. Advised our procedure scheduler will call to schedule this once receiving office has reviewed her information. Advised that if she is interested in dry needling to let us know and we could make this referral to physical therapy as well.

## 2017-09-13 NOTE — Patient Instructions (Signed)
Repeat all exercises 10-15 times, 1-2 times per day.  1) Shoulder Protraction    Begin with elbows by your side, slowly "punch" straight out in front of you keeping arms/elbows straight.      2) Shoulder Flexion  Standing:         Begin with arms at your side with thumbs pointed up, slowly raise both arms up and forward towards overhead.        3) Horizontal abduction/adduction   Standing:           Begin with arms straight out in front of you, bring out to the side in at "T" shape. Keep arms straight entire time.         4) Internal & External Rotation    *No band* -Stand with elbows at the side and elbows bent 90 degrees. Move your forearms away from your body, then bring back inward toward the body.     5) Shoulder Abduction   Standing:       Lying on your back begin with your arms flat on the table next to your side. Slowly move your arms out to the side so that they go overhead, in a jumping jack or snow angel movement.

## 2017-09-13 NOTE — Telephone Encounter (Signed)
Can you advise on this one? I am copying Dr. Romona Curls response from yesterday. Patient was going to give cervical ESI more time because it has been less than a week, but she called back today. Per Dr. Ernestina Patches 09/12/17: "If it helped a lot, including the whole hand numbness then she could repeat x1 injection, or have Erin order EMG/NCV by neurology or Erin could get spine/neurosurgery referral.  Alternatively PT with Dry needling if not done but I think she has had"

## 2017-09-14 ENCOUNTER — Telehealth (INDEPENDENT_AMBULATORY_CARE_PROVIDER_SITE_OTHER): Payer: Self-pay

## 2017-09-14 ENCOUNTER — Encounter (INDEPENDENT_AMBULATORY_CARE_PROVIDER_SITE_OTHER): Payer: Self-pay | Admitting: Orthopedic Surgery

## 2017-09-14 ENCOUNTER — Ambulatory Visit (INDEPENDENT_AMBULATORY_CARE_PROVIDER_SITE_OTHER): Payer: 59 | Admitting: Orthopedic Surgery

## 2017-09-14 VITALS — Ht 62.0 in | Wt 160.0 lb

## 2017-09-14 DIAGNOSIS — M7542 Impingement syndrome of left shoulder: Secondary | ICD-10-CM | POA: Diagnosis not present

## 2017-09-14 MED ORDER — LIDOCAINE HCL 1 % IJ SOLN
5.0000 mL | INTRAMUSCULAR | Status: AC | PRN
  Administered 2017-09-14: 5 mL

## 2017-09-14 MED ORDER — METHYLPREDNISOLONE ACETATE 40 MG/ML IJ SUSP
40.0000 mg | INTRAMUSCULAR | Status: AC | PRN
  Administered 2017-09-14: 40 mg via INTRA_ARTICULAR

## 2017-09-14 MED ORDER — GABAPENTIN 100 MG PO CAPS: 100.0000 mg | ORAL_CAPSULE | Freq: Three times a day (TID) | ORAL | 3 refills | Status: DC

## 2017-09-14 NOTE — Telephone Encounter (Signed)
Patient called this morning wanting something to be done for her pain so that she is able to work.  She is out of work today because of the pain.  Please advise patient.  CB#[phone] (Cell) or [phone].  Thank you.

## 2017-09-14 NOTE — Telephone Encounter (Signed)
Patient coming in at 1230pm. Put in 4 oclock spot, to arrive at 1230pm.

## 2017-09-14 NOTE — Telephone Encounter (Signed)
Patient called stating she was having SEVERE arm pain. She states the injection with [facility] wasn't helpful at all. She states she can't keep laying out of work, please call patient back and let her know what else we can do

## 2017-09-14 NOTE — Progress Notes (Signed)
Office Visit Note   Patient: Nichole Brown           Date of Birth: Sep 21, 1966           MRN: [id number] Visit Date: 09/14/2017              Requested by: Mikey Kirschner, [address] [place], [place] [address] PCP: Mikey Kirschner, MD  Chief Complaint  Patient presents with  . Neck - Pain  . Left Arm - Pain      HPI: Patient is a 51 year old woman status post left shoulder arthroscopy for impingement. Patient had some radicular symptoms and underwent a epidural injection. She states the burning pain that was going down to her thumb has resolved but she has some generalized numbness in the left upper extremity at this time. She states the pain is primarily with range of motion of her shoulder. Patient states that with therapy the therapist feel like she is making excellent improvement and she his improving her range of motion well.  Assessment & Plan: Visit Diagnoses:  1. Impingement syndrome of left shoulder     Plan: The subacromial space was injected. We will also start her on some low dose Neurontin to help with the nerve irritation. She'll continue with therapy she is given a note to be out of work until Monday. Recommended that she get FMLA paperwork for whenever she would need to be out of work.  Follow-Up Instructions: Return in about 2 weeks (around 09/28/2017).   Ortho Exam  Patient is alert, oriented, no adenopathy, well-dressed, normal affect, normal respiratory effort. Examination patient has full abduction and flexion of the left shoulder compared to the right she has tenderness to palpation over the before meals joint tenderness to palpation over the biceps tendon tenderness to palpation around the rotator cuff. There is no crepitation with range of motion of the shoulder there is no laxity.  Imaging: No results found. No images are attached to the encounter.  Labs: No results found for: HGBA1C, ESRSEDRATE, CRP, LABURIC, REPTSTATUS,  GRAMSTAIN, CULT, LABORGA  Orders:  No orders of the defined types were placed in this encounter.  Meds ordered this encounter  Medications  . gabapentin (NEURONTIN) 100 MG capsule    Sig: Take 1 capsule (100 mg total) by mouth 3 (three) times daily. When necessary for neuropathy pain    Dispense:  90 capsule    Refill:  3     Procedures: Large Joint Inj Date/Time: 09/14/2017 1:20 PM Performed by: DUDA, MARCUS V Authorized by: Newt Minion   Consent Given by:  Patient Site marked: the procedure site was marked   Timeout: prior to procedure the correct patient, procedure, and site was verified   Indications:  Pain and diagnostic evaluation Location:  Shoulder Site:  L subacromial bursa Prep: patient was prepped and draped in usual sterile fashion   Needle Size:  22 G Needle Length:  1.5 inches Approach:  Posterior Ultrasound Guidance: No   Fluoroscopic Guidance: No   Arthrogram: No   Medications:  5 mL lidocaine 1 %; 40 mg methylPREDNISolone acetate 40 MG/ML Aspiration Attempted: No   Patient tolerance:  Patient tolerated the procedure well with no immediate complications    Clinical Data: No additional findings.  ROS:  All other systems negative, except as noted in the HPI. Review of Systems  Objective: Vital Signs: Ht 5\' 2"  (1.575 m)   Wt 160 lb (72.6 kg)   BMI  29.26 kg/m   Specialty Comments:  No specialty comments available.  PMFS History: Patient Active Problem List   Diagnosis Date Noted  . Impingement syndrome of left shoulder   . 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 Gastroesophageal reflux disease without esophagitis 12/26/2016  . Grief at loss of child 11/09/2016  . Sternal fracture 10/06/2015  . Contusion of leg 10/06/2015  . Migraine headache 07/24/2013  . Insomnia 05/30/2013  . Fibromyalgia 05/30/2013   Past Medical History:  Diagnosis Date  . Anxiety   . Bronchitis   . Depression   . Fibromyalgia 2005  . GERD  (gastroesophageal reflux disease)   . Insomnia   . Migraine headache   . Pneumonia     Family History  Problem Relation Age of Onset  . Hypertension Maternal Grandmother   . Diabetes Maternal Grandmother   . Heart disease Maternal Grandmother 40       MI  . Hypertension Maternal Grandfather   . Throat cancer Maternal Grandfather   . Breast cancer Mother 44  . Hypertension Father     Past Surgical History:  Procedure Laterality Date  . ABDOMINAL HYSTERECTOMY    . CESAREAN SECTION    . CHOLECYSTECTOMY  1988  . open heart surgery  1967  . PATENT DUCTUS ARTERIOUS REPAIR  1967  . SHOULDER ARTHROSCOPY Left 06/16/2017   Procedure: LEFT SHOULDER ARTHROSCOPY, DEBRIDEMENT, AND DECOMPRESSION;  Surgeon: Newt Minion, MD;  Location: [place];  Service: Orthopedics;  Laterality: Left;  . SHOULDER SURGERY Left 2008   [facility] joint   Social History   Occupational History  . Not on file.   Social History Main Topics  . Smoking status: Never Smoker  . Smokeless tobacco: Never Used  . Alcohol use Yes     Comment: occassionally  . Drug use: No  . Sexual activity: Yes    Birth control/ protection: Surgical

## 2017-09-14 NOTE — Telephone Encounter (Signed)
Message was already sent to Dr. Sharol Given patient has had extensive conservative treatment already. I called her back to advise we are seeing patients. Advised Dr. Sharol Given would like to see her today. FYI She is coming at 1230 pm put her in 4pm spot it was the only thing left.

## 2017-09-14 NOTE — Telephone Encounter (Signed)
Let's see her today

## 2017-09-14 NOTE — Telephone Encounter (Signed)
I'm not sure what else to offer this patient. She is status post shoulder scope 06/16/17, complaining of numbness and weakness of arm and hand. 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 Nichole Brown already ordered MRI of cervical spine, which showed following:  Multilevel chronic cervical facet degeneration, greater on the left. Predominately mild associated cervical neural foraminal stenosis.Chronic disc and endplate degeneration at C5-C6 with up to moderate left C6 neural foraminal stenosis, borderline to mild spinal and right foraminal stenosis.  She has already had epidural injection with [facility] for her neck with no relief. She has been referred to Neurology for EMG/NCV and consultation. She is already doing physical therapy and has been since August, they are going to start dry needling. Already on NSAIDS from medical doctor. Please advise what you would like to do?

## 2017-09-15 ENCOUNTER — Ambulatory Visit (HOSPITAL_COMMUNITY): Payer: 59 | Admitting: Occupational Therapy

## 2017-09-15 ENCOUNTER — Telehealth (HOSPITAL_COMMUNITY): Payer: Self-pay | Admitting: Occupational Therapy

## 2017-09-15 ENCOUNTER — Encounter (HOSPITAL_COMMUNITY): Payer: Self-pay | Admitting: Occupational Therapy

## 2017-09-15 ENCOUNTER — Telehealth (INDEPENDENT_AMBULATORY_CARE_PROVIDER_SITE_OTHER): Payer: Self-pay | Admitting: Orthopedic Surgery

## 2017-09-15 NOTE — Telephone Encounter (Signed)
Patient called advised she can not move her arm yet. Patient want to know if she can wait going back to work until after she have her NCS with Dr Posey Pronto. Patient said it takes all of her energy trying to work. Patient also want to know if the FLMA papers were received? The number to contact patient is [phone] or [phone] Home #

## 2017-09-15 NOTE — Telephone Encounter (Signed)
I called and spoke with patient advised her that a letter was written for her to be out of working pending NCS with Dr. Posey Pronto scheduled next Tuesday 09/19/17. She states she called her employer and they faxed her FMLA paperwork yesterday. Can you see if we received this so I can fill this out for her.

## 2017-09-15 NOTE — Telephone Encounter (Signed)
Forms are here from [employer]. I will bing to you.

## 2017-09-15 NOTE — Telephone Encounter (Signed)
Pt cx due to getting an injection and she will call back to r/s.

## 2017-09-18 ENCOUNTER — Other Ambulatory Visit: Payer: Self-pay | Admitting: *Deleted

## 2017-09-18 DIAGNOSIS — M79602 Pain in left arm: Secondary | ICD-10-CM

## 2017-09-18 NOTE — Progress Notes (Unsigned)
emg 

## 2017-09-18 NOTE — Telephone Encounter (Signed)
I called and spoke with patient about forms, this was faxed back tonight to [employer] at 610pm to [phone].

## 2017-09-19 ENCOUNTER — Telehealth (INDEPENDENT_AMBULATORY_CARE_PROVIDER_SITE_OTHER): Payer: Self-pay | Admitting: Orthopedic Surgery

## 2017-09-19 ENCOUNTER — Ambulatory Visit (INDEPENDENT_AMBULATORY_CARE_PROVIDER_SITE_OTHER): Payer: 59 | Admitting: Neurology

## 2017-09-19 DIAGNOSIS — M5412 Radiculopathy, cervical region: Secondary | ICD-10-CM

## 2017-09-19 DIAGNOSIS — M79602 Pain in left arm: Secondary | ICD-10-CM | POA: Diagnosis not present

## 2017-09-19 NOTE — Procedures (Signed)
[facility]  [place], [place]  [place], [place] [address] Tel: [phone] Fax:  [phone] Test Date:  09/19/2017  Patient: Nichole Brown DOB: September 10, 1966 Physician: Narda Amber, DO  Sex: Female Height: 5\' 2"  Ref Phys: Dondra Prader, NP  ID#: [id number] Temp: 33.9C Technician:    Patient Complaints: This is a 51 year old female referred for evaluation of left neck pain and paresthesias radiating into the left forearm and hand.   NCV & EMG Findings: Extensive electrodiagnostic testing of the left upper extremity shows:  1. Left median, ulnar, and mixed palmar sensory responses are within normal limits. 2. Left median and ulnar motor responses are within normal limits. 3. Chronic motor axon loss changes are seen affecting the pronator teres and biceps muscles, without accompanied active denervation.   Impression: Chronic C6 radiculopathy affecting the left upper extremity, mild in degree electrically.   ___________________________ Narda Amber, DO    Nerve Conduction Studies Anti Sensory Summary Table   Site NR Peak (ms) Norm Peak (ms) P-T Amp (V) Norm P-T Amp  Left Median Anti Sensory (2nd Digit)  33.9C  Wrist    2.8 <3.6 37.8 >15  Left Ulnar Anti Sensory (5th Digit)  33.9C  Wrist    2.2 <3.1 22.0 >10   Motor Summary Table   Site NR Onset (ms) Norm Onset (ms) O-P Amp (mV) Norm O-P Amp Site1 Site2 Delta-0 (ms) Dist (cm) Vel (m/s) Norm Vel (m/s)  Left Median Motor (Abd Poll Brev)  33.9C  Wrist    2.7 <4.0 10.2 >6 Elbow Wrist 4.6 28.0 61 >50  Elbow    7.3  10.2         Left Ulnar Motor (Abd Dig Minimi)  33.9C  Wrist    2.0 <3.1 8.7 >7 B Elbow Wrist 3.3 22.0 67 >50  B Elbow    5.3  8.5  A Elbow B Elbow 1.7 10.0 59 >50  A Elbow    7.0  8.2          Comparison Summary Table   Site NR Peak (ms) Norm Peak (ms) P-T Amp (V) Site1 Site2 Delta-P (ms) Norm Delta (ms)  Left Median/Ulnar Palm Comparison (Wrist - 8cm)  33.9C  Median Palm    1.5 <2.2  73.5 Median Palm Ulnar Palm 0.1   Ulnar Palm    1.4 <2.2 17.4       EMG   Side Muscle Ins Act Fibs Psw Fasc Number Recrt Dur Dur. Amp Amp. Poly Poly. Comment  Left 1stDorInt Nml Nml Nml Nml Nml Nml Nml Nml Nml Nml Nml Nml N/A  Left PronatorTeres Nml Nml Nml Nml 1- Rapid Some 1+ Some 1+ Nml Nml N/A  Left Biceps Nml Nml Nml Nml 1- Rapid Some 1+ Some 1+ Nml Nml N/A  Left Triceps Nml Nml Nml Nml Nml Nml Nml Nml Nml Nml Nml Nml N/A  Left Deltoid Nml Nml Nml Nml Nml Nml Nml Nml Nml Nml Nml Nml N/A      Waveforms:

## 2017-09-19 NOTE — Telephone Encounter (Signed)
Call patient. We can take her out of work as needed for her radicular symptoms. Do not see an indication for surgical intervention from her electrodiagnostic studies. Best options are to continue physical therapy.

## 2017-09-19 NOTE — Telephone Encounter (Signed)
Patient called to let Dr. Sharol Given know that she is not doing any better with the injection in her arm.  She states that she feels very unsafe about returning to work with the type of work she does.  She is scheduled to go back to work tomorrow.  She is still in a great deal of pain.  CB#[phone].  Thank you

## 2017-09-19 NOTE — Telephone Encounter (Signed)
I did call patient advised an extension for oow can be written. Advised on FMLA continuous leave was through tomorrow. We can send addendum need be. She was also requested for intermittent leave and I put on [employer]. Advised her I would let Dr. Sharol Given know, we will call tomorrow.

## 2017-09-20 ENCOUNTER — Telehealth (INDEPENDENT_AMBULATORY_CARE_PROVIDER_SITE_OTHER): Payer: Self-pay | Admitting: Orthopedic Surgery

## 2017-09-20 NOTE — Telephone Encounter (Signed)
Patient called asking if the results from her nerve study was in? CB # [phone]

## 2017-09-21 NOTE — Telephone Encounter (Signed)
I called and spoke with patient to advise patient of message below. Advised we would fax addendum to FMLA, extending patient out of work until she is seen back in our office. She has appointment with physical therapy for dry needling tomorrow, and will do a couple sessions of this. Hopefully after this her symptoms will have improved and we can reevaluate her work status. Faxed to [phone]

## 2017-09-21 NOTE — Telephone Encounter (Signed)
I called and left voicemail to advise patient of message below. Advised we would fax addendum to FMLA, extending patient out of work until she is seen back in our office. She has appointment with physical therapy for dry needling tomorrow, and will do a couple sessions of this. Hopefully after this her symptoms will have improved and we can reevaluate her work status. Faxed to [phone]

## 2017-09-22 ENCOUNTER — Ambulatory Visit (HOSPITAL_COMMUNITY): Payer: 59

## 2017-09-22 DIAGNOSIS — M6281 Muscle weakness (generalized): Secondary | ICD-10-CM | POA: Diagnosis not present

## 2017-09-22 DIAGNOSIS — M25612 Stiffness of left shoulder, not elsewhere classified: Secondary | ICD-10-CM | POA: Diagnosis not present

## 2017-09-22 DIAGNOSIS — G8929 Other chronic pain: Secondary | ICD-10-CM

## 2017-09-22 DIAGNOSIS — R29898 Other symptoms and signs involving the musculoskeletal system: Secondary | ICD-10-CM | POA: Diagnosis not present

## 2017-09-22 DIAGNOSIS — M62838 Other muscle spasm: Secondary | ICD-10-CM | POA: Diagnosis not present

## 2017-09-22 DIAGNOSIS — M25512 Pain in left shoulder: Secondary | ICD-10-CM | POA: Diagnosis not present

## 2017-09-22 NOTE — Therapy (Signed)
Nichole Brown [address] [place], [place], [address] Phone: [phone]   Fax:  [phone]  Physical Therapy Evaluation  Patient Details  Name: Nichole Brown MRN: [id number] Date of Birth: 1966/11/23 Referring Provider: Dondra Prader   Encounter Date: 09/22/2017      PT End of Session - 09/22/17 1614    Visit Number 1   Number of Visits 12   Date for PT Re-Evaluation 10/13/17   Authorization Type [employer]   Authorization Time Period 09/22/17-11/03/17   PT Start Time 1305   PT Stop Time 1352   PT Time Calculation (min) 47 min   Activity Tolerance Patient tolerated treatment well   Behavior During Therapy [facility] for tasks assessed/performed      Past Medical History:  Diagnosis Date  . Anxiety   . Bronchitis   . Depression   . Fibromyalgia 2005  . GERD (gastroesophageal reflux disease)   . Insomnia   . Migraine headache   . Pneumonia     Past Surgical History:  Procedure Laterality Date  . ABDOMINAL HYSTERECTOMY    . CESAREAN SECTION    . CHOLECYSTECTOMY  1988  . open heart surgery  1967  . PATENT DUCTUS ARTERIOUS REPAIR  1967  . SHOULDER ARTHROSCOPY Left 06/16/2017   Procedure: LEFT SHOULDER ARTHROSCOPY, DEBRIDEMENT, AND DECOMPRESSION;  Surgeon: Newt Minion, MD;  Location: [facility];  Service: Orthopedics;  Laterality: Left;  . SHOULDER SURGERY Left 2008   AC joint    There were no vitals filed for this visit.       Subjective Assessment - 09/22/17 1317    Currently in Pain? Yes   Pain Score 6    Pain Location --  Left anterior shoulder            [facility] PT Assessment - 09/22/17 0001      Assessment   Medical Diagnosis Left shoulder pain, LUE paresthesia    Referring Provider Dondra Prader    Onset Date/Surgical Date --  June 16, 2017    Hand Dominance Right   Next MD Visit 10/02/17  Dr. Sharol Given    Prior Therapy OT at tthis facility     Precautions   Precautions None     Restrictions   Weight Bearing Restrictions No     Balance Screen   Has the patient fallen in the past 6 months No   Has the patient had a decrease in activity level because of a fear of falling?  No   Is the patient reluctant to leave their home because of a fear of falling?  No     Prior Function   Level of Independence Independent   Vocation Full time employment  written out of work at this time.      Sensation   Light Touch Not tested  transient parestheis; nerve conduction testing WNL     Posture/Postural Control   Posture Comments rounded shoulders bilat, excessive ant scap tilt     PROM   Left Shoulder Flexion 146 Degrees  supine   Left Shoulder ABduction 81 Degrees  supine   Left Shoulder Internal Rotation 50 Degrees  supine, increases LUE paresthesia    Left Shoulder External Rotation 37 Degrees  supine     Strength   Strength Assessment Site Elbow   Right/Left Shoulder Right;Left  Bilat upper trap: 5/5, grip grossly 50% weaker on left   Right Shoulder ABduction 5/5   Right Shoulder Internal Rotation  5/5   Right Shoulder External Rotation 5/5   Left Shoulder ABduction 4-/5  painful   Left Shoulder Internal Rotation 4-/5  painful   Left Shoulder External Rotation 4+/5  painful   Right/Left Elbow Right;Left   Right Elbow Flexion 5/5   Right Elbow Extension 5/5   Left Elbow Flexion 5/5  minimal pain increase   Left Elbow Extension 5/5  increases left shoulder pain, ?anterior instability     Flexibility   Soft Tissue Assessment /Muscle Length --  sig pain palpation lt pec major, pec minor, deltoid, biceps             Objective measurements completed on examination: See above findings.          [facility] Adult PT Treatment/Exercise - 09/22/17 0001      Shoulder Exercises: Seated   Retraction AROM;10 reps;Both  10x5sec hold (HEP review)      Manual Therapy   Manual Therapy Myofascial release;Passive ROM   Myofascial Release Pec major, pec minor following dry  needling   9 minutes   Passive ROM lt posterior scapular tilt stretch of pec minor in supine.   3 minutes          Trigger Point Dry Needling - 09/22/17 1612    Consent Given? Yes   Education Handout Provided Yes   Muscles Treated Upper Body Pectoralis major;Pectoralis minor  Lt anterior detoid.    Pectoralis Major Response Twitch response elicited;Palpable increased muscle length   Pectoralis Minor Response Twitch response elicited;Palpable increased muscle length              PT Education - 09/22/17 1613    Education provided Yes   Education Details how scapular posturing can limit normal glenohumeral funcitonal movements and cause pain.    Person(s) Educated Patient   Methods Explanation;Demonstration   Comprehension Verbalized understanding;Returned demonstration          PT Short Term Goals - 09/22/17 1627      PT SHORT TERM GOAL #1   Title After 3 weeks patient will report decreased left shoulder pain at rest and during prolonger activity in home<4/10.      PT SHORT TERM GOAL #2   Title After 3 weeks patient will demonstrate correct form and report good compliance with HEP for shoulder strengthening, postural correction, and pain management.            PT Long Term Goals - 09/22/17 1628      PT LONG TERM GOAL #1   Title After 6 weeks the patient will demonstrate improved Left shoulder ROM in IR/ER by 15 degrees, 20 degrees abduction, and 15 degrees flexion to improve ability to perform work activities and ADL.      PT LONG TERM GOAL #2   Title After 6 weeks patient will demonstrate 4/5 or greater strength in LUE in all areas tested at evaluation to improve ability to perform work related activities.      PT LONG TERM GOAL #3   Title After 6 weeks patient will verbalize importance of correct scapular posturaling for optimal shoulder function during bimanual tasks.                 Plan - 09/22/17 1615    Clinical Impression Statement Nichole Brown is a 51yo white female who presents with chronic sustained pain in the left anterior shoulder and concordant LUE paresthesia (intermittent and insidious) x4 months. Please see note in detail for full list of impairments and  limitations. Pt demonstrating decreasese left glenohumeral ROM with pain, weakness, and multiple muscle spasms. Pain is consisten with passive stretch and activation of subscapularis. Paresthesia worsens rapidly with passive GHJ internal rotation. Papation demonstrates significant pain in anterior soft tissue including pec major/minor. Today's treatment focused on trigger point dry needling and myofascial release to pec major, pec major, and anterior deltoid on left with good overall response, decrease in point pain threshold, improved scapular mobility, and decreased pain at rest.    History and Personal Factors relevant to plan of care: Shoulder surgery in July 2018, now finishing OT for this problem.    Clinical Presentation Stable   Clinical Decision Making Moderate   Rehab Potential Good   PT Frequency 2x / week   PT Duration 6 weeks   PT Treatment/Interventions Dry needling;Patient/family education;Electrical Stimulation;Cryotherapy;Moist Heat;Therapeutic activities;Therapeutic exercise;Neuromuscular re-education;Passive range of motion;Manual techniques   PT Next Visit Plan Review findings and goals; HEP for scapular strengthening and RC isometrics for pain control on left. dry needling and MFR to address left shoulder muscle spams.    PT Home Exercise Plan Scapular retraction isometrics    Consulted and Agree with Plan of Care Patient      Patient will benefit from skilled therapeutic intervention in order to improve the following deficits and impairments:  Hypomobility, Decreased activity tolerance, Decreased strength, Impaired UE functional use, Pain, Increased muscle spasms, Improper body mechanics, Postural dysfunction  Visit Diagnosis: Chronic left shoulder  pain - Plan: PT plan of care cert/re-cert  Stiffness of left shoulder, not elsewhere classified - Plan: PT plan of care cert/re-cert  Muscle weakness (generalized) - Plan: PT plan of care cert/re-cert  Other muscle spasm - Plan: PT plan of care cert/re-cert     Problem List Patient Active Problem List   Diagnosis Date Noted  . Impingement syndrome of left shoulder   . 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 Gastroesophageal reflux disease without esophagitis 12/26/2016  . Grief at loss of child 11/09/2016  . Sternal fracture 10/06/2015  . Contusion of leg 10/06/2015  . Migraine headache 07/24/2013  . Insomnia 05/30/2013  . Fibromyalgia 05/30/2013    4:34 PM, 09/22/17 Etta Grandchild, PT, DPT Physical Therapist at [facility] [phone] (office)      Etta Grandchild 09/22/2017, 4:33 PM  [facility] [address] [place], [place], [address] Phone: [phone]   Fax:  [phone]  Name: JAYLA MACKIE MRN: [id number] Date of Birth: 04-18-1966

## 2017-09-22 NOTE — Patient Instructions (Signed)
Trigger Point Dry Needling  . What is Trigger Point Dry Needling (DN)? o DN is a physical therapy technique used to treat muscle pain and dysfunction. Specifically, DN helps deactivate muscle trigger points (muscle knots).  o A thin filiform needle is used to penetrate the skin and stimulate the underlying trigger point. The goal is for a local twitch response (LTR) to occur and for the trigger point to relax. No medication of any kind is injected during the procedure.   . What Does Trigger Point Dry Needling Feel Like?  o The procedure feels different for each individual patient. Some patients report that they do not actually feel the needle enter the skin and overall the process is not painful. Very mild bleeding may occur. However, many patients feel a deep cramping in the muscle in which the needle was inserted. This is the local twitch response.   Marland Kitchen How Will I feel after the treatment? o Soreness is normal, and the onset of soreness may not occur for a few hours. Typically this soreness is resolved after 24-48 hours.  o Bruising is can occur occasionally (5-10% of sticks)  o In rare cases feeling tired or nauseous after the treatment is normal. In addition, your symptoms may get worse before they get better, this period will typically not last longer than 24 hours.   . What Can I do After My Treatment? o Increase your hydration by drinking more water for the next 24 hours. o You may place ice or heat on the areas treated that have become sore, however, do not use heat on inflamed or bruised areas. Heat often brings more relief post needling. o You can continue your regular activities, but vigorous activity is not recommended initially after the treatment for 24 hours. o DN is best combined with other physical therapy such as strengthening, stretching, and other therapies.

## 2017-09-25 ENCOUNTER — Ambulatory Visit (HOSPITAL_COMMUNITY): Payer: 59

## 2017-09-25 DIAGNOSIS — M9903 Segmental and somatic dysfunction of lumbar region: Secondary | ICD-10-CM | POA: Diagnosis not present

## 2017-09-25 DIAGNOSIS — M62838 Other muscle spasm: Secondary | ICD-10-CM

## 2017-09-25 DIAGNOSIS — M25612 Stiffness of left shoulder, not elsewhere classified: Secondary | ICD-10-CM | POA: Diagnosis not present

## 2017-09-25 DIAGNOSIS — M9904 Segmental and somatic dysfunction of sacral region: Secondary | ICD-10-CM | POA: Diagnosis not present

## 2017-09-25 DIAGNOSIS — G8929 Other chronic pain: Secondary | ICD-10-CM

## 2017-09-25 DIAGNOSIS — M50122 Cervical disc disorder at C5-C6 level with radiculopathy: Secondary | ICD-10-CM | POA: Diagnosis not present

## 2017-09-25 DIAGNOSIS — M9901 Segmental and somatic dysfunction of cervical region: Secondary | ICD-10-CM | POA: Diagnosis not present

## 2017-09-25 DIAGNOSIS — R29898 Other symptoms and signs involving the musculoskeletal system: Secondary | ICD-10-CM

## 2017-09-25 DIAGNOSIS — M5137 Other intervertebral disc degeneration, lumbosacral region: Secondary | ICD-10-CM | POA: Diagnosis not present

## 2017-09-25 DIAGNOSIS — M6281 Muscle weakness (generalized): Secondary | ICD-10-CM

## 2017-09-25 DIAGNOSIS — M25512 Pain in left shoulder: Secondary | ICD-10-CM

## 2017-09-25 DIAGNOSIS — M9905 Segmental and somatic dysfunction of pelvic region: Secondary | ICD-10-CM | POA: Diagnosis not present

## 2017-09-25 DIAGNOSIS — Q72812 Congenital shortening of left lower limb: Secondary | ICD-10-CM | POA: Diagnosis not present

## 2017-09-25 DIAGNOSIS — M5384 Other specified dorsopathies, thoracic region: Secondary | ICD-10-CM | POA: Diagnosis not present

## 2017-09-25 DIAGNOSIS — M9902 Segmental and somatic dysfunction of thoracic region: Secondary | ICD-10-CM | POA: Diagnosis not present

## 2017-09-25 NOTE — Therapy (Signed)
[facility] [address] [place], [place], [address] Phone: [phone]   Fax:  [phone]  Physical Therapy Treatment  Patient Details  Name: Nichole Brown MRN: [id number] Date of Birth: 1966/04/20 Referring Provider: Dondra Prader   Encounter Date: 09/25/2017      PT End of Session - 09/25/17 1605    Visit Number 2   Number of Visits 12   Date for PT Re-Evaluation 10/13/17   Authorization Type [employer]   Authorization Time Period 09/22/17-11/03/17   PT Start Time 1437   PT Stop Time 1530   PT Time Calculation (min) 53 min   Activity Tolerance Patient tolerated treatment well;No increased pain   Behavior During Therapy WFL for tasks assessed/performed      Past Medical History:  Diagnosis Date  . Anxiety   . Bronchitis   . Depression   . Fibromyalgia 2005  . GERD (gastroesophageal reflux disease)   . Insomnia   . Migraine headache   . Pneumonia     Past Surgical History:  Procedure Laterality Date  . ABDOMINAL HYSTERECTOMY    . CESAREAN SECTION    . CHOLECYSTECTOMY  1988  . open heart surgery  1967  . PATENT DUCTUS ARTERIOUS REPAIR  1967  . SHOULDER ARTHROSCOPY Left 06/16/2017   Procedure: LEFT SHOULDER ARTHROSCOPY, DEBRIDEMENT, AND DECOMPRESSION;  Surgeon: Newt Minion, MD;  Location: [facility];  Service: Orthopedics;  Laterality: Left;  . SHOULDER SURGERY Left 2008   AC joint    There were no vitals filed for this visit.      Subjective Assessment - 09/25/17 1439    Subjective Pt reports she has been feeling much better, but did have some increase in shoulder pain with the cold weather. Pt reported some anticipated soreness for 1-2 days, and then largely felt much better and pain free until cold weather came in. Pt reports a consistent decrease in paresthesia concordants with pain decrease.    Currently in Pain? Yes   Pain Score 5    Pain Location --  anterio shoulder and forearm    Pain  Orientation Left      Manual Therapy  -Trigger point dry needling x 7 minutes to pec major , pec minor, deltoid all on left side: No noted twitch or resistance noted with pec minor, pec major most responsive near the distal attachment on the humerus. Deltoid most painful with multiple twitch responses and paresthesia referral to the LUE.   -Myofascial release and passive release techniques x8 minutes to pec major -PROM stretching (posterior scapular tilt in supine) left side of the pec minor, 3x45sec   Therapeutic Exercise  1. Left shoulder doorframe isometric GHJ Internal rotation, 15x3sec (added to HEP)  2. Left shoulder doorframe isometric GHJ external rotation, 15x3sec (added to HEP)  3. Dumbbell Shoulder abduction, 3lb weights 2x10 left (VC to avoid shrug (added to HEP)  4. Prone on plinth, horizontal abduction middle trap T's, no weight 1x10 ( added to HEP)  5. Scapular retraction (scap squeeze) HEP from first visit reviewed verbally.           PT Education - 09/25/17 1605    Education provided Yes   Education Details education on new HEP and correct form    Person(s) Educated Patient   Methods Explanation   Comprehension Verbalized understanding;Returned demonstration;Verbal cues required          PT Short Term Goals - 09/25/17 1609  PT SHORT TERM GOAL #1   Title After 3 weeks patient will report decreased left shoulder pain at rest and during prolonger activity in home<4/10.    Status On-going     PT SHORT TERM GOAL #2   Title After 3 weeks patient will demonstrate correct form and report good compliance with HEP for shoulder strengthening, postural correction, and pain management.    Status On-going     PT SHORT TERM GOAL #3   Title After three weeks patient will demonstrate ability to perform job duties at work for up to 4 hours without exacerbation of pain or symptoms.    Status On-going           PT Long Term Goals - 09/22/17 1628      PT LONG TERM  GOAL #1   Title After 6 weeks the patient will demonstrate improved Left shoulder ROM in IR/ER by 15 degrees, 20 degrees abduction, and 15 degrees flexion to improve ability to perform work activities and ADL.      PT LONG TERM GOAL #2   Title After 6 weeks patient will demonstrate 4/5 or greater strength in LUE in all areas tested at evaluation to improve ability to perform work related activities.      PT LONG TERM GOAL #3   Title After 6 weeks patient will verbalize importance of correct scapular posturaling for optimal shoulder function during bimanual tasks.                Plan - 09/25/17 1606    Clinical Impression Statement This date, evaluation visit, objective findings, and treatments goals discussed. Pt reports good response overall to dry needling/myofascial release last session, with 3-4 day decrease in pain and improved mobility. HEP was commenced as discussed with good success. Able to reproduce paresthesia symptoms today with dry needling of deltoid. Additionally, pt reports decreased paresthesia concordant with pain reduction curious for thoracic outlet involvement: will continue to monitor. This session continued focus on manual therapy to address scapular mobility deficits and myofascial trigger points/pain. HEP is expanded upon and taught in depth with hand out provided. Pt able to complete all activities without exacerbation of pain. Good progress toward goals overall. Will FU in 4 days with review new HEP updates, manual therapy, and therapeutic exercise.     Rehab Potential Good   PT Frequency 2x / week   PT Duration 6 weeks   PT Treatment/Interventions Dry needling;Patient/family education;Electrical Stimulation;Cryotherapy;Moist Heat;Therapeutic activities;Therapeutic exercise;Neuromuscular re-education;Passive range of motion;Manual techniques   PT Next Visit Plan Continue with manual therapy to address ROm limitations and pain, and generalized shoulder/RC  strengthening.    PT Home Exercise Plan *see attached document   Consulted and Agree with Plan of Care Patient      Patient will benefit from skilled therapeutic intervention in order to improve the following deficits and impairments:  Hypomobility, Decreased activity tolerance, Decreased strength, Impaired UE functional use, Pain, Increased muscle spasms, Improper body mechanics, Postural dysfunction  Visit Diagnosis: Chronic left shoulder pain  Stiffness of left shoulder, not elsewhere classified  Muscle weakness (generalized)  Other muscle spasm  Acute pain of left shoulder  Other symptoms and signs involving the musculoskeletal system     Problem List Patient Active Problem List   Diagnosis Date Noted  . Impingement syndrome of left shoulder   . 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 Gastroesophageal reflux disease without esophagitis 12/26/2016  . Grief at loss of child 11/09/2016  . Sternal  fracture 10/06/2015  . Contusion of leg 10/06/2015  . Migraine headache 07/24/2013  . Insomnia 05/30/2013  . Fibromyalgia 05/30/2013   4:13 PM, 09/25/17 Etta Grandchild, PT, DPT Physical Therapist - [facility] [phone] (Office)    Etta Grandchild 09/25/2017, [facility] [address] [place], [place], [address] Phone: [phone]   Fax:  [phone]  Name: KALANI BARAY MRN: [id number] Date of Birth: 21-Jun-1966

## 2017-09-27 ENCOUNTER — Telehealth (INDEPENDENT_AMBULATORY_CARE_PROVIDER_SITE_OTHER): Payer: Self-pay | Admitting: Orthopedic Surgery

## 2017-09-27 NOTE — Telephone Encounter (Signed)
[facility] ADVISED COPY OF RECORDS READY TO BE PICKED UP.

## 2017-09-28 ENCOUNTER — Ambulatory Visit (HOSPITAL_COMMUNITY): Payer: 59

## 2017-09-28 DIAGNOSIS — M6281 Muscle weakness (generalized): Secondary | ICD-10-CM | POA: Diagnosis not present

## 2017-09-28 DIAGNOSIS — G8929 Other chronic pain: Secondary | ICD-10-CM

## 2017-09-28 DIAGNOSIS — M25512 Pain in left shoulder: Principal | ICD-10-CM

## 2017-09-28 DIAGNOSIS — R29898 Other symptoms and signs involving the musculoskeletal system: Secondary | ICD-10-CM | POA: Diagnosis not present

## 2017-09-28 DIAGNOSIS — M25612 Stiffness of left shoulder, not elsewhere classified: Secondary | ICD-10-CM

## 2017-09-28 DIAGNOSIS — M62838 Other muscle spasm: Secondary | ICD-10-CM | POA: Diagnosis not present

## 2017-09-28 NOTE — Therapy (Signed)
[facility] [address] [place], [place], [address] Phone: [phone]   Fax:  [phone]  Physical Therapy Treatment  Patient Details  Name: Nichole Brown MRN: [id number] Date of Birth: 02-17-1966 Referring Provider: Dondra Prader   Encounter Date: 09/28/2017      PT End of Session - 09/28/17 1000    Visit Number 3   Number of Visits 12   Date for PT Re-Evaluation 10/13/17   Authorization Type [employer]   Authorization Time Period 09/22/17-11/03/17   PT Start Time 0905   PT Stop Time 0955   PT Time Calculation (min) 50 min   Activity Tolerance Patient tolerated treatment well  limited by dizziness, severe pain at left 1st rib   Behavior During Therapy [facility] for tasks assessed/performed      Past Medical History:  Diagnosis Date  . Anxiety   . Bronchitis   . Depression   . Fibromyalgia 2005  . GERD (gastroesophageal reflux disease)   . Insomnia   . Migraine headache   . Pneumonia     Past Surgical History:  Procedure Laterality Date  . ABDOMINAL HYSTERECTOMY    . CESAREAN SECTION    . CHOLECYSTECTOMY  1988  . open heart surgery  1967  . PATENT DUCTUS ARTERIOUS REPAIR  1967  . SHOULDER ARTHROSCOPY Left 06/16/2017   Procedure: LEFT SHOULDER ARTHROSCOPY, DEBRIDEMENT, AND DECOMPRESSION;  Surgeon: Newt Minion, MD;  Location: [facility];  Service: Orthopedics;  Laterality: Left;  . SHOULDER SURGERY Left 2008   AC joint    There were no vitals filed for this visit.      Subjective Assessment - 09/28/17 0909    Subjective (P)  Pt reports a lot of burning in arm upon waking yesterday, but today is resolved mostly.        -Trigger point dry needling x 10 minutes left side to pec minor, deltoid, subscapularis, upper traps, coracobrachialis.  Tolerated well, not nearly as painful as last visit.  -Myofascial release and passive release techniques minutes to pec major, pec minor, scalenes, upper traps -PROM  stretching (posterior scapular tilt in supine) left side of the pec minor, 3x30sec supine, 3x30sec prone;  Other:  Towel roll stretch  x 5 minutes, scapular retraction 1x10, star gazer stretch 1x30sec (added to HEP)   Vertebral Artery Test:  Negative with extension + Rt rotation Positive with extension + Lt rotation       PT Education - 09/28/17 1000    Education provided Yes   Education Details (+) results of vertebral artery test warrant FU with PCP    Person(s) Educated Patient   Methods Explanation   Comprehension Verbalized understanding          PT Short Term Goals - 09/25/17 1609      PT SHORT TERM GOAL #1   Title After 3 weeks patient will report decreased left shoulder pain at rest and during prolonger activity in home<4/10.    Status On-going     PT SHORT TERM GOAL #2   Title After 3 weeks patient will demonstrate correct form and report good compliance with HEP for shoulder strengthening, postural correction, and pain management.    Status On-going     PT SHORT TERM GOAL #3   Title After three weeks patient will demonstrate ability to perform job duties at work for up to 4 hours without exacerbation of pain or symptoms.    Status On-going  PT Long Term Goals - 09/22/17 1628      PT LONG TERM GOAL #1   Title After 6 weeks the patient will demonstrate improved Left shoulder ROM in IR/ER by 15 degrees, 20 degrees abduction, and 15 degrees flexion to improve ability to perform work activities and ADL.      PT LONG TERM GOAL #2   Title After 6 weeks patient will demonstrate 4/5 or greater strength in LUE in all areas tested at evaluation to improve ability to perform work related activities.      PT LONG TERM GOAL #3   Title After 6 weeks patient will verbalize importance of correct scapular posturaling for optimal shoulder function during bimanual tasks.                Plan - 09/28/17 1000    Clinical Impression Statement COntinued with  heavy manual therpay to address myofascial restrictions about the left scapula and left GHJ. Pt reponded well overall, with continued signififcan tsorenss att he pec major, and insertion of deltoid/pec major on the left. The pt continues to have reproduction of LUE parethesia with Left GHJ P/ROM external rotation, excessinve anterior scapular tilt, and sustained pressure of the poec minor on the coracoid process. The pt has severe pain limiting palpation at the first rib, hence no rib mobilizaion is possible. Pt rpeort ssome dizziness at that point. PT performed Vetebral Artery Test at this time, d/t current dizziness, and pt mentioning hx of LOC with head turns (estimed 25x in past several years.) Vertebral aertery test is performed in supine, and is positive with cervical extrension +Left rotation. Discussed with patient and will communicate with PCP.    Rehab Potential Good   PT Frequency 2x / week   PT Duration 6 weeks   PT Treatment/Interventions Dry needling;Patient/family education;Electrical Stimulation;Cryotherapy;Moist Heat;Therapeutic activities;Therapeutic exercise;Neuromuscular re-education;Passive range of motion;Manual techniques   PT Next Visit Plan FU on home towel roll startgazer stretch, continue with scapular strengthening Ts and Ys in prone, move doorfram isometrics to sidelyign dumbbells or bands.    PT Home Exercise Plan *see attached document   Consulted and Agree with Plan of Care Patient      Patient will benefit from skilled therapeutic intervention in order to improve the following deficits and impairments:  Hypomobility, Decreased activity tolerance, Decreased strength, Impaired UE functional use, Pain, Increased muscle spasms, Improper body mechanics, Postural dysfunction  Visit Diagnosis: Chronic left shoulder pain  Stiffness of left shoulder, not elsewhere classified  Muscle weakness (generalized)  Other muscle spasm     Problem List Patient Active Problem  List   Diagnosis Date Noted  . Impingement syndrome of left shoulder   . 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 Gastroesophageal reflux disease without esophagitis 12/26/2016  . Grief at loss of child 11/09/2016  . Sternal fracture 10/06/2015  . Contusion of leg 10/06/2015  . Migraine headache 07/24/2013  . Insomnia 05/30/2013  . Fibromyalgia 05/30/2013   10:11 AM, 09/28/17 Etta Grandchild, PT, DPT Physical Therapist at [facility] [phone] (office)      Etta Grandchild 09/28/2017, 10:10 AM  [facility] [address] [place], [place], [address] Phone: [phone]   Fax:  [phone]  Name: Nichole Brown MRN: [id number] Date of Birth: Nov 20, 1966

## 2017-09-29 ENCOUNTER — Ambulatory Visit (INDEPENDENT_AMBULATORY_CARE_PROVIDER_SITE_OTHER): Payer: 59 | Admitting: Family Medicine

## 2017-09-29 ENCOUNTER — Encounter: Payer: Self-pay | Admitting: Family Medicine

## 2017-09-29 ENCOUNTER — Encounter (HOSPITAL_COMMUNITY): Payer: Self-pay

## 2017-09-29 VITALS — BP 118/82 | Ht 62.0 in | Wt 170.8 lb

## 2017-09-29 DIAGNOSIS — R42 Dizziness and giddiness: Secondary | ICD-10-CM

## 2017-09-29 DIAGNOSIS — R55 Syncope and collapse: Secondary | ICD-10-CM

## 2017-09-29 NOTE — Progress Notes (Signed)
   Subjective:    Patient ID: Nichole Brown, female    DOB: 1966/01/13, 51 y.o.   MRN: [id number]  HPI  Patient arrives to discuss recent physical therapy results concerning left arm.  Pt s p should surg with worse pain tho four monthw out  Pt experiencing  occas passing out spells  Has hx of this with bad headahe and shoulder pain and painful menses  Smoked briefly  Hx of no smoking and chol an and bp all good    Now on reflux med helps some  He main reason for patient coming in todah further follow-up physicaltherapistconcerns.Substantialistoryobtained.  Patienthasalonghistoryofsyncopeoccurringgenerallywithpain.  Nearly all of her faing spells through the years been associated with substantial pain. Patient noted that physical therapy was extremely painful. She has soft tissue inflammation and pain around the involved shoulder jointand the physical therapist was manipulating her shoulder she became very painful and felt like she might want to pass out.  Also patient notes vertigo. Occurs generally with head and neck movement or motion. Often associated spinning sensation which last for a few moments. Worse when in a dentist  Risk factors for premature vascular disease were reviewed with patient. She has not    Review of Systems No headache, no major weight loss or weight gain, no chest pain no back pain abdominal pain no change in bowel habits complete ROS otherwise negative     Objective:   Physical Exam Alert and oriented, vitals reviewed and stable, NAD ENT-TM's and ext canals WNL bilat via otoscopic exam Soft palate, tonsils and post pharynx WNL via oropharyngeal exam Neck-symmetric, no masses; thyroid nonpalpable and nontender Pulmonary-no tachypnea or accessory muscle use; Clear without wheezes via auscultation Card--no abnrml murmurs, rhythm reg and rate WNL Carotid pulses symmetric, without bruits        Assessment & Plan:  Impression 1 recurrent  syncopal episodes generally vasovagal in nature secondary to painrecurrent vertigo episodes with head and neck movement. Both of these occurred with recent physical therapy intervention. I feel she does not have any evidence of vertebrobasilar artery disease and I feel a major workup along these lines is not warranted at this time. Discussed with patient.

## 2017-09-29 NOTE — Progress Notes (Deleted)
Subjective:     Patient ID: Nichole Brown, female   DOB: [birth year], 51 y.o.   MRN: [id number]  HPI   Review of Systems     Objective:   Physical Exam     Assessment:     ***    Plan:     ***

## 2017-10-02 ENCOUNTER — Ambulatory Visit (HOSPITAL_COMMUNITY): Payer: 59

## 2017-10-02 ENCOUNTER — Ambulatory Visit (INDEPENDENT_AMBULATORY_CARE_PROVIDER_SITE_OTHER): Payer: 59 | Admitting: Orthopedic Surgery

## 2017-10-02 ENCOUNTER — Encounter (INDEPENDENT_AMBULATORY_CARE_PROVIDER_SITE_OTHER): Payer: Self-pay | Admitting: Orthopedic Surgery

## 2017-10-02 ENCOUNTER — Other Ambulatory Visit: Payer: Self-pay | Admitting: Family Medicine

## 2017-10-02 DIAGNOSIS — M62838 Other muscle spasm: Secondary | ICD-10-CM | POA: Diagnosis not present

## 2017-10-02 DIAGNOSIS — M25512 Pain in left shoulder: Principal | ICD-10-CM

## 2017-10-02 DIAGNOSIS — M25612 Stiffness of left shoulder, not elsewhere classified: Secondary | ICD-10-CM

## 2017-10-02 DIAGNOSIS — R29898 Other symptoms and signs involving the musculoskeletal system: Secondary | ICD-10-CM | POA: Diagnosis not present

## 2017-10-02 DIAGNOSIS — G8929 Other chronic pain: Secondary | ICD-10-CM

## 2017-10-02 DIAGNOSIS — M6281 Muscle weakness (generalized): Secondary | ICD-10-CM

## 2017-10-02 DIAGNOSIS — M7542 Impingement syndrome of left shoulder: Secondary | ICD-10-CM

## 2017-10-02 NOTE — Therapy (Signed)
[facility] [address] [place], [place], [address] Phone: [phone]   Fax:  [phone]  Physical Therapy Treatment  Patient Details  Name: Nichole Brown MRN: [id number] Date of Birth: [birth year] Referring Provider: Dondra Prader   Encounter Date: 10/02/2017      PT End of Session - 10/02/17 1529    Visit Number 4   Number of Visits 12   Date for PT Re-Evaluation 10/13/17   Authorization Type [employer]   Authorization Time Period 09/22/17-11/03/17   PT Start Time 1436   PT Stop Time 1520   PT Time Calculation (min) 44 min   Activity Tolerance Patient tolerated treatment well   Behavior During Therapy [facility] for tasks assessed/performed      Past Medical History:  Diagnosis Date  . Anxiety   . Bronchitis   . Depression   . Fibromyalgia 2005  . GERD (gastroesophageal reflux disease)   . Insomnia   . Migraine headache   . Pneumonia     Past Surgical History:  Procedure Laterality Date  . ABDOMINAL HYSTERECTOMY    . CESAREAN SECTION    . CHOLECYSTECTOMY  1988  . open heart surgery  1967  . PATENT DUCTUS ARTERIOUS REPAIR  1967  . SHOULDER ARTHROSCOPY Left 06/16/2017   Procedure: LEFT SHOULDER ARTHROSCOPY, DEBRIDEMENT, AND DECOMPRESSION;  Surgeon: Newt Minion, MD;  Location: [facility];  Service: Orthopedics;  Laterality: Left;  . SHOULDER SURGERY Left 2008   AC joint    There were no vitals filed for this visit.      Subjective Assessment - 10/02/17 1438    Subjective Pt reports soreness, but overall improvement with functional use during the day. She reports improved easy pushing and grasping. Paresthesia and pain continue to correspond consistently. HEP going well.    Currently in Pain? No/denies  soreness only.          Today's Intervention -Longitudinal half foam roll pec stretch x 3 minutes: supported T on Left, startgazer on right  (thumb pulling sensation ( median nerve neural tension testing  negative)  -P/ROM Left scapular depression stretch (pec minor) 2x30 seconds.  -Left side lying dumbbell IR rotation 3x12 2lb, 3lb, 4lb  -Supine Shoulder flexion 0-90 degrees 3x10 (2lb, 3lb, 3lb)  -Skull Crusher LeftUE: 2x10 c 3lb -Seated LUE external rotation: 3x10 (performed at 90* abduction, 0-75*)   HEP Updates:           PT Education - 10/02/17 1529    Education provided Yes   Education Details explained importance of GHJ stabilization during new HEP additions   Person(s) Educated Patient   Methods Explanation   Comprehension Verbalized understanding          PT Short Term Goals - 09/25/17 1609      PT SHORT TERM GOAL #1   Title After 3 weeks patient will report decreased left shoulder pain at rest and during prolonger activity in home<4/10.    Status On-going     PT SHORT TERM GOAL #2   Title After 3 weeks patient will demonstrate correct form and report good compliance with HEP for shoulder strengthening, postural correction, and pain management.    Status On-going     PT SHORT TERM GOAL #3   Title After three weeks patient will demonstrate ability to perform job duties at work for up to 4 hours without exacerbation of pain or symptoms.    Status On-going  PT Long Term Goals - 09/22/17 1628      PT LONG TERM GOAL #1   Title After 6 weeks the patient will demonstrate improved Left shoulder ROM in IR/ER by 15 degrees, 20 degrees abduction, and 15 degrees flexion to improve ability to perform work activities and ADL.      PT LONG TERM GOAL #2   Title After 6 weeks patient will demonstrate 4/5 or greater strength in LUE in all areas tested at evaluation to improve ability to perform work related activities.      PT LONG TERM GOAL #3   Title After 6 weeks patient will verbalize importance of correct scapular posturaling for optimal shoulder function during bimanual tasks.                Plan - 10/02/17 1530    Clinical Impression Statement  Good report from patient, able to perform scapular depression with greater ease adnwith decreased pain. Pt also feeling scapula rmobility with UE movement too for first time. HEP is upgraded from isometrics in doorway to dumbbell isolated activation of rotator cuff and triceps. ROM demonstrates imrpovement and isolated stabilziation GHJ is also improved. Pt begins back to work tomorrow, hence sclose monitoringof symptoms and progress will follow.    Rehab Potential Good   PT Frequency 2x / week   PT Duration 6 weeks   PT Treatment/Interventions Dry needling;Patient/family education;Electrical Stimulation;Cryotherapy;Moist Heat;Therapeutic activities;Therapeutic exercise;Neuromuscular re-education;Passive range of motion;Manual techniques   PT Next Visit Plan continue with scapular strengthening Ts and Ys in prone, continue with doorframe isometrics to sidelying dumbbells or bands.    PT Home Exercise Plan *see attached document   Consulted and Agree with Plan of Care Patient      Patient will benefit from skilled therapeutic intervention in order to improve the following deficits and impairments:  Hypomobility, Decreased activity tolerance, Decreased strength, Impaired UE functional use, Pain, Increased muscle spasms, Improper body mechanics, Postural dysfunction  Visit Diagnosis: Chronic left shoulder pain  Stiffness of left shoulder, not elsewhere classified  Muscle weakness (generalized)  Other muscle spasm     Problem List Patient Active Problem List   Diagnosis Date Noted  . Impingement syndrome of left shoulder   . 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 Gastroesophageal reflux disease without esophagitis 12/26/2016  . Grief at loss of child 11/09/2016  . Sternal fracture 10/06/2015  . Contusion of leg 10/06/2015  . Migraine headache 07/24/2013  . Insomnia 05/30/2013  . Fibromyalgia 05/30/2013   3:39 PM, 10/02/17 Etta Grandchild, PT, DPT Physical Therapist at  [facility] [phone] (office)    [facility] [address] [place], [place], [address] Phone: [phone]   Fax:  [phone]  Name: Nichole Brown MRN: [id number] Date of Birth: Jan 02, 1966

## 2017-10-02 NOTE — Progress Notes (Signed)
Office Visit Note   Patient: Nichole Brown           Date of Birth: 12-16-1965           MRN: [id number] Visit Date: 10/02/2017              Requested by: Mikey Kirschner, [place], [place] [address] PCP: Mikey Kirschner, MD  Chief Complaint  Patient presents with  . Left Shoulder - Follow-up    06/16/17 Left Shoulder Scope and Debridement Decompression      HPI: Patient is a 51 year old woman who is 3 months status post left shoulder arthroscopy and debridement.  Patient states that she is getting better slowly she states that she is currently doing dry needling with physical therapy.  She states that the nerve conduction studies showed some chronic radiculopathy left upper extremity and patient was advised to continue doing her therapy.  She states she is only had 3 sessions of dry needling.  Assessment & Plan: Visit Diagnoses:  1. Impingement syndrome of left shoulder     Plan: Continue with physical therapy no restrictions she may return to work at this time.  Follow-up as needed.  Follow-Up Instructions: Return if symptoms worsen or fail to improve.   Ortho Exam  Patient is alert, oriented, no adenopathy, well-dressed, normal affect, normal respiratory effort. Examination patient has full range of motion of both shoulders.  She is pleased with her range of motion no focal motor weakness in either upper extremity.  Imaging: No results found. No images are attached to the encounter.  Labs: No results found for: HGBA1C, ESRSEDRATE, CRP, LABURIC, REPTSTATUS, GRAMSTAIN, CULT, LABORGA  Orders:  No orders of the defined types were placed in this encounter.  No orders of the defined types were placed in this encounter.    Procedures: No procedures performed  Clinical Data: No additional findings.  ROS:  All other systems negative, except as noted in the HPI. Review of Systems  Objective: Vital Signs: There were no vitals taken  for this visit.  Specialty Comments:  No specialty comments available.  PMFS History: Patient Active Problem List   Diagnosis Date Noted  . Impingement syndrome of left shoulder   . 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 Gastroesophageal reflux disease without esophagitis 12/26/2016  . Grief at loss of child 11/09/2016  . Sternal fracture 10/06/2015  . Contusion of leg 10/06/2015  . Migraine headache 07/24/2013  . Insomnia 05/30/2013  . Fibromyalgia 05/30/2013   Past Medical History:  Diagnosis Date  . Anxiety   . Bronchitis   . Depression   . Fibromyalgia 2005  . GERD (gastroesophageal reflux disease)   . Insomnia   . Migraine headache   . Pneumonia     Family History  Problem Relation Age of Onset  . Hypertension Maternal Grandmother   . Diabetes Maternal Grandmother   . Heart disease Maternal Grandmother 40       MI  . Hypertension Maternal Grandfather   . Throat cancer Maternal Grandfather   . Breast cancer Mother 78  . Hypertension Father     Past Surgical History:  Procedure Laterality Date  . ABDOMINAL HYSTERECTOMY    . CESAREAN SECTION    . CHOLECYSTECTOMY  1988  . open heart surgery  1967  . PATENT DUCTUS ARTERIOUS REPAIR  1967  . SHOULDER ARTHROSCOPY Left 06/16/2017   Procedure: LEFT SHOULDER ARTHROSCOPY, DEBRIDEMENT, AND DECOMPRESSION;  Surgeon: Meridee Score  V, MD;  Location: [facility];  Service: Orthopedics;  Laterality: Left;  . SHOULDER SURGERY Left 2008   [facility] joint   Social History   Occupational History  . Not on file.   Social History Main Topics  . Smoking status: Never Smoker  . Smokeless tobacco: Never Used  . Alcohol use Yes     Comment: occassionally  . Drug use: No  . Sexual activity: Yes    Birth control/ protection: Surgical

## 2017-10-03 DIAGNOSIS — M5137 Other intervertebral disc degeneration, lumbosacral region: Secondary | ICD-10-CM | POA: Diagnosis not present

## 2017-10-03 DIAGNOSIS — M50122 Cervical disc disorder at C5-C6 level with radiculopathy: Secondary | ICD-10-CM | POA: Diagnosis not present

## 2017-10-03 DIAGNOSIS — M9905 Segmental and somatic dysfunction of pelvic region: Secondary | ICD-10-CM | POA: Diagnosis not present

## 2017-10-03 DIAGNOSIS — Q72812 Congenital shortening of left lower limb: Secondary | ICD-10-CM | POA: Diagnosis not present

## 2017-10-03 DIAGNOSIS — M5384 Other specified dorsopathies, thoracic region: Secondary | ICD-10-CM | POA: Diagnosis not present

## 2017-10-03 DIAGNOSIS — M9901 Segmental and somatic dysfunction of cervical region: Secondary | ICD-10-CM | POA: Diagnosis not present

## 2017-10-03 DIAGNOSIS — M9903 Segmental and somatic dysfunction of lumbar region: Secondary | ICD-10-CM | POA: Diagnosis not present

## 2017-10-03 DIAGNOSIS — M9904 Segmental and somatic dysfunction of sacral region: Secondary | ICD-10-CM | POA: Diagnosis not present

## 2017-10-03 DIAGNOSIS — M9902 Segmental and somatic dysfunction of thoracic region: Secondary | ICD-10-CM | POA: Diagnosis not present

## 2017-10-03 NOTE — Telephone Encounter (Signed)
Ok plus 5 ref 

## 2017-10-06 ENCOUNTER — Ambulatory Visit (HOSPITAL_COMMUNITY): Payer: 59

## 2017-10-06 DIAGNOSIS — M6281 Muscle weakness (generalized): Secondary | ICD-10-CM

## 2017-10-06 DIAGNOSIS — M25612 Stiffness of left shoulder, not elsewhere classified: Secondary | ICD-10-CM

## 2017-10-06 DIAGNOSIS — M62838 Other muscle spasm: Secondary | ICD-10-CM | POA: Diagnosis not present

## 2017-10-06 DIAGNOSIS — M25512 Pain in left shoulder: Principal | ICD-10-CM

## 2017-10-06 DIAGNOSIS — G8929 Other chronic pain: Secondary | ICD-10-CM

## 2017-10-06 DIAGNOSIS — R29898 Other symptoms and signs involving the musculoskeletal system: Secondary | ICD-10-CM | POA: Diagnosis not present

## 2017-10-06 NOTE — Therapy (Signed)
[facility] [address] [place], [place], [address] Phone: [phone]   Fax:  [phone]  Physical Therapy Treatment  Patient Details  Name: Nichole Brown MRN: [id number] Date of Birth: [birth year] Referring Provider: Dondra Prader   Encounter Date: 10/06/2017      PT End of Session - 10/06/17 1514    Visit Number 5   Number of Visits 12   Date for PT Re-Evaluation 10/13/17   Authorization Type [employer]   Authorization Time Period 09/22/17-11/03/17   PT Start Time 0973   PT Stop Time 1512   PT Time Calculation (min) 38 min   Activity Tolerance Patient tolerated treatment well;No increased pain   Behavior During Therapy WFL for tasks assessed/performed      Past Medical History:  Diagnosis Date  . Anxiety   . Bronchitis   . Depression   . Fibromyalgia 2005  . GERD (gastroesophageal reflux disease)   . Insomnia   . Migraine headache   . Pneumonia     Past Surgical History:  Procedure Laterality Date  . ABDOMINAL HYSTERECTOMY    . CESAREAN SECTION    . CHOLECYSTECTOMY  1988  . open heart surgery  1967  . PATENT DUCTUS ARTERIOUS REPAIR  1967  . SHOULDER ARTHROSCOPY Left 06/16/2017   Procedure: LEFT SHOULDER ARTHROSCOPY, DEBRIDEMENT, AND DECOMPRESSION;  Surgeon: Newt Minion, MD;  Location: [facility];  Service: Orthopedics;  Laterality: Left;  . SHOULDER SURGERY Left 2008   AC joint    There were no vitals filed for this visit.      Subjective Assessment - 10/06/17 1512    Subjective Overall improvement, worked tues-Friday with 2 pain free days, some aggravation on Wednesday that self-resolved. Trying to troubleshoot how to do HEP around work schedule and fatigue.    Currently in Pain? Yes   Pain Score 3    Pain Orientation --  left anterior shoulder.        Therex This Visit:  -Supine shoulder flexion 2x10  -Today pain at biceps tendon  loaded elbow flexion 1x15 3lb, 1x15 4lb  -Free weight  Internal rotation Left shoulder: 2x10 4lb weight (VC, tactile cues for form correction)  -Prone Y to Ws 2x10  -Standing Scaption 2x15 Left (mild thumb symptoms) -Supine Single UE skull crushers 2x10 bilat (1lb on left, 2lb on right)  -Standing abduction dumbbells, 2lbs bilat, 1x10, progressed to 3lb dumbbells   -Standing GreenTB Extension 2x12, 90-0 degrees c retraction -Stading BlueTB rows 2x12 c VC for retraction  -Supine foam roll pec stretch, T, 1x3 minutes -P/ROM Left scapular retraction stretch 3 x 30sec         PT Short Term Goals - 09/25/17 1609      PT SHORT TERM GOAL #1   Title After 3 weeks patient will report decreased left shoulder pain at rest and during prolonger activity in home<4/10.    Status On-going     PT SHORT TERM GOAL #2   Title After 3 weeks patient will demonstrate correct form and report good compliance with HEP for shoulder strengthening, postural correction, and pain management.    Status On-going     PT SHORT TERM GOAL #3   Title After three weeks patient will demonstrate ability to perform job duties at work for up to 4 hours without exacerbation of pain or symptoms.    Status On-going           PT Long Term Goals -  09/22/17 1628      PT LONG TERM GOAL #1   Title After 6 weeks the patient will demonstrate improved Left shoulder ROM in IR/ER by 15 degrees, 20 degrees abduction, and 15 degrees flexion to improve ability to perform work activities and ADL.      PT LONG TERM GOAL #2   Title After 6 weeks patient will demonstrate 4/5 or greater strength in LUE in all areas tested at evaluation to improve ability to perform work related activities.      PT LONG TERM GOAL #3   Title After 6 weeks patient will verbalize importance of correct scapular posturaling for optimal shoulder function during bimanual tasks.                Plan - 10/06/17 1514    Clinical Impression Statement Making progress overall, improved work tolerance with less  pain at home. Some correction with HEP updates from last visit. Good awareness of loss of scapular tilt with loading, trying to correct in clinic and at work. Questionable biceps tendon irritation, responding well to progressive loading. Still some transient numbness in thumb with certain positions.  Will perform reassessment next session.    Rehab Potential Good   PT Frequency 2x / week   PT Duration 6 weeks   PT Next Visit Plan Reassessment: examine elbow ROM, soft tissue restrictions in the Left elbow, supination pronation, GHJ mobility and gliding.    PT Home Exercise Plan *see attached document   Consulted and Agree with Plan of Care Patient      Patient will benefit from skilled therapeutic intervention in order to improve the following deficits and impairments:  Hypomobility, Decreased activity tolerance, Decreased strength, Impaired UE functional use, Pain, Increased muscle spasms, Improper body mechanics, Postural dysfunction  Visit Diagnosis: Chronic left shoulder pain  Stiffness of left shoulder, not elsewhere classified  Muscle weakness (generalized)  Other muscle spasm     Problem List Patient Active Problem List   Diagnosis Date Noted  . Impingement syndrome of left shoulder   . 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 Gastroesophageal reflux disease without esophagitis 12/26/2016  . Grief at loss of child 11/09/2016  . Sternal fracture 10/06/2015  . Contusion of leg 10/06/2015  . Migraine headache 07/24/2013  . Insomnia 05/30/2013  . Fibromyalgia 05/30/2013   3:18 PM, 10/06/17 Etta Grandchild, PT, DPT Physical Therapist at [facility] [phone] (office)      Etta Grandchild 10/06/2017, 3:17 PM  [facility] [address] [place], [place], [address] Phone: [phone]   Fax:  [phone]  Name: Nichole Brown MRN: [id number] Date of Birth: April 08, 1966

## 2017-10-09 ENCOUNTER — Ambulatory Visit (HOSPITAL_COMMUNITY): Payer: 59

## 2017-10-09 DIAGNOSIS — M62838 Other muscle spasm: Secondary | ICD-10-CM | POA: Diagnosis not present

## 2017-10-09 DIAGNOSIS — M6281 Muscle weakness (generalized): Secondary | ICD-10-CM | POA: Diagnosis not present

## 2017-10-09 DIAGNOSIS — M25512 Pain in left shoulder: Secondary | ICD-10-CM | POA: Diagnosis not present

## 2017-10-09 DIAGNOSIS — G8929 Other chronic pain: Secondary | ICD-10-CM | POA: Diagnosis not present

## 2017-10-09 DIAGNOSIS — M25612 Stiffness of left shoulder, not elsewhere classified: Secondary | ICD-10-CM | POA: Diagnosis not present

## 2017-10-09 DIAGNOSIS — R29898 Other symptoms and signs involving the musculoskeletal system: Secondary | ICD-10-CM | POA: Diagnosis not present

## 2017-10-09 NOTE — Therapy (Signed)
[facility] [address] [place], [place], [address] Phone: [phone]   Fax:  [phone]  Physical Therapy Treatment  Patient Details  Name: Nichole Brown MRN: [id number] Date of Birth: 07/30/1966 Referring Provider: Dondra Prader   Encounter Date: 10/09/2017      PT End of Session - 10/09/17 1635    Visit Number 6   Number of Visits 12   Date for PT Re-Evaluation 10/13/17   Authorization Type [employer]   Authorization Time Period 09/22/17-11/03/17   PT Start Time 1444  pt overslept, arrived late    PT Stop Time 1519   PT Time Calculation (min) 35 min   Activity Tolerance Patient tolerated treatment well;No increased pain   Behavior During Therapy WFL for tasks assessed/performed      Past Medical History:  Diagnosis Date  . Anxiety   . Bronchitis   . Depression   . Fibromyalgia 2005  . GERD (gastroesophageal reflux disease)   . Insomnia   . Migraine headache   . Pneumonia     Past Surgical History:  Procedure Laterality Date  . ABDOMINAL HYSTERECTOMY    . CESAREAN SECTION    . CHOLECYSTECTOMY  1988  . open heart surgery  1967  . PATENT DUCTUS ARTERIOUS REPAIR  1967  . SHOULDER ARTHROSCOPY Left 06/16/2017   Procedure: LEFT SHOULDER ARTHROSCOPY, DEBRIDEMENT, AND DECOMPRESSION;  Surgeon: Newt Minion, MD;  Location: [facility];  Service: Orthopedics;  Laterality: Left;  . SHOULDER SURGERY Left 2008   AC joint    There were no vitals filed for this visit.      Subjective Assessment - 10/09/17 1445    Subjective Pt now back to work over 1 week, adn picking up extra shifts thus far, no significant aggravation of pain in shoulder that has not been managed well with home program. Pt happy with progress, and pain inshoulder has been decrasedoverall allowing for additional use in ADL/IADL.    Pertinent History Left shoulder surgeyr in July 2018    Currently in Pain? Yes   Pain Score 2    Pain Location --   Anterior left shoulder             [facility] PT Assessment - 10/09/17 0001      Assessment   Medical Diagnosis Left shoulder pain, LUE paresthesia    Onset Date/Surgical Date --  June 16, 2017    Hand Dominance Right   Next MD Visit --  As needed only   Prior Therapy OT at this facility     Precautions   Precautions None     Restrictions   Weight Bearing Restrictions No     Balance Screen   Has the patient fallen in the past 6 months No   Has the patient had a decrease in activity level because of a fear of falling?  No   Is the patient reluctant to leave their home because of a fear of falling?  No     Prior Function   Level of Independence Independent   Vocation Full time employment  written out of work at this time.      PROM   Left Shoulder Flexion 166 Degrees  146 at eval (full range c scapula included)    Left Shoulder ABduction 169 Degrees  81 degrees at eval   Left Shoulder Internal Rotation 60 Degrees  was 50 degrees at eval    Left Shoulder External Rotation 82 Degrees  37 degrees at eval      Strength   Overall Strength Comments Left posterior deltoid: 3+/5; Left middle trap 3+/5   Right Shoulder ABduction 5/5   Right Shoulder Internal Rotation 5/5   Right Shoulder External Rotation 5/5   Left Shoulder Flexion 4+/5  mild soreness (was 4/5 on 9/14)    Left Shoulder ABduction 5/5  mild pain (weak 5/5)    Left Shoulder Internal Rotation 5/5  pain free (4-/5)   Left Shoulder External Rotation 5/5  pain free (4+/5)    Left Elbow Flexion 5/5   Left Elbow Extension 4+/5       Therapeutic Exercise this session: - Prone Ts  bilat: 1x10 no weight, 1x10 1lb  - Left skull crushers, 2x10 c 5lb free weight  - Standing biceps curl 2x10 c 8lb free weight, VC tactile cues for scapular retraction.           PT Short Term Goals - 10/09/17 1515      PT SHORT TERM GOAL #1   Title After 3 weeks patient will report decreased left shoulder pain at rest and  during prolonger activity in home<4/10.    Status Achieved     PT SHORT TERM GOAL #2   Title After 3 weeks patient will demonstrate correct form and report good compliance with HEP for shoulder strengthening, postural correction, and pain management.    Status Achieved     PT SHORT TERM GOAL #3   Title After three weeks patient will demonstrate ability to perform job duties at work for up to 4 hours without exacerbation of pain or symptoms.    Baseline Able to get through the mornign run without shoulder pain increasing; as pain improves so does paresthesia.    Status Achieved           PT Long Term Goals - 10/09/17 1510      PT LONG TERM GOAL #1   Title After 6 weeks the patient will demonstrate improved Left shoulder ROM in IR/ER by 15 degrees, 20 degrees abduction, and 15 degrees flexion to improve ability to perform work activities and ADL.    Status Achieved     PT LONG TERM GOAL #2   Title After 6 weeks patient will demonstrate 4/5 or greater strength in LUE in all areas tested at evaluation to improve ability to perform work related activities.    Baseline GHJ movers 5/5; posteriro deltoid and middle trap sare 3+/5 at reassessment    Status On-going     PT LONG TERM GOAL #3   Title After 6 weeks patient will verbalize importance of correct scapular posturaling for optimal shoulder function during bimanual tasks.    Status Achieved               Plan - 10/09/17 1636    Clinical Impression Statement Reassessment performed this session, withpatient meeting 5/6 goasl thus far, including longterm DC goals. Pain continues to decrease overall, rarely >3/10, ROM has improve substantially, very near WNL, and rotator cuff strength is 5/5 and pain free. Scaular depressors adn retractors (lower trap, middle trap, postrio deltoid) remain moderately weak. Pec limitation of scaular ROM are improving and less severe. Strengthening of  biceps continues to decrease anterio shoulderpain  within session.    Rehab Potential Good   PT Frequency 2x / week   PT Duration 6 weeks   PT Treatment/Interventions Dry needling;Patient/family education;Electrical Stimulation;Cryotherapy;Moist Heat;Therapeutic activities;Therapeutic exercise;Neuromuscular re-education;Passive range of motion;Manual techniques   PT  Next Visit Plan Update HEP to include appropriately loaded prone Ts, Ys, Biceps and Triceps; consider moving to 1x weekly thereafter    PT Home Exercise Plan *see attached document   Consulted and Agree with Plan of Care Patient      Patient will benefit from skilled therapeutic intervention in order to improve the following deficits and impairments:  Hypomobility, Decreased activity tolerance, Decreased strength, Impaired UE functional use, Pain, Increased muscle spasms, Improper body mechanics, Postural dysfunction  Visit Diagnosis: Chronic left shoulder pain  Stiffness of left shoulder, not elsewhere classified  Muscle weakness (generalized)  Other muscle spasm  Acute pain of left shoulder     Problem List Patient Active Problem List   Diagnosis Date Noted  . Impingement syndrome of left shoulder   . 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 Gastroesophageal reflux disease without esophagitis 12/26/2016  . Grief at loss of child 11/09/2016  . Sternal fracture 10/06/2015  . Contusion of leg 10/06/2015  . Migraine headache 07/24/2013  . Insomnia 05/30/2013  . Fibromyalgia 05/30/2013    Buccola,Allan C 10/09/2017, 4:48 PM   4:49 PM, 10/09/17 Etta Grandchild, PT, DPT Physical Therapist at [facility] [phone] (office)      [facility] [address] [place], [place], [address] Phone: [phone]   Fax:  [phone]  Name: Nichole Brown MRN: [id number] Date of Birth: [birth year]

## 2017-10-09 NOTE — Patient Instructions (Signed)
-  Added 1lb to patient prone horizontal abduction at home

## 2017-10-13 ENCOUNTER — Ambulatory Visit (HOSPITAL_COMMUNITY): Payer: 59

## 2017-10-13 ENCOUNTER — Telehealth (HOSPITAL_COMMUNITY): Payer: Self-pay | Admitting: Family Medicine

## 2017-10-13 NOTE — Telephone Encounter (Signed)
10/13/17  Pt called to cancel said that she had a stomach bug

## 2017-10-16 ENCOUNTER — Ambulatory Visit (HOSPITAL_COMMUNITY): Payer: 59 | Attending: Family

## 2017-10-16 ENCOUNTER — Encounter (HOSPITAL_COMMUNITY): Payer: Self-pay

## 2017-10-16 DIAGNOSIS — M62838 Other muscle spasm: Secondary | ICD-10-CM | POA: Insufficient documentation

## 2017-10-16 DIAGNOSIS — M25512 Pain in left shoulder: Secondary | ICD-10-CM | POA: Insufficient documentation

## 2017-10-16 DIAGNOSIS — G8929 Other chronic pain: Secondary | ICD-10-CM | POA: Insufficient documentation

## 2017-10-16 DIAGNOSIS — R29898 Other symptoms and signs involving the musculoskeletal system: Secondary | ICD-10-CM | POA: Insufficient documentation

## 2017-10-16 DIAGNOSIS — M25612 Stiffness of left shoulder, not elsewhere classified: Secondary | ICD-10-CM | POA: Insufficient documentation

## 2017-10-16 DIAGNOSIS — M6281 Muscle weakness (generalized): Secondary | ICD-10-CM | POA: Insufficient documentation

## 2017-10-16 NOTE — Therapy (Signed)
Pt arrived for visit, reports she has had emesis x4 days, most recently this morning. Asked patient to cancel appointment today and return as scheduled, pending 24 hours free of vomiting or diarrhea symptoms. Pt is agreeable.   2:51 PM, 10/16/17 Etta Grandchild, PT, DPT Physical Therapist - [facility] [phone] (Office)

## 2017-10-18 ENCOUNTER — Telehealth (HOSPITAL_COMMUNITY): Payer: Self-pay | Admitting: Family Medicine

## 2017-10-18 NOTE — Telephone Encounter (Signed)
10/18/17  [facility] won't be here and asked [place] to reschedule for 11/8

## 2017-10-18 NOTE — Telephone Encounter (Signed)
10/18/17  Left patient a message to change the 11/9 appt to 11/8 either 11;15 or 4pm since [facility] won't be here

## 2017-10-19 ENCOUNTER — Ambulatory Visit (HOSPITAL_COMMUNITY): Payer: 59

## 2017-10-19 DIAGNOSIS — M25612 Stiffness of left shoulder, not elsewhere classified: Secondary | ICD-10-CM

## 2017-10-19 DIAGNOSIS — M25512 Pain in left shoulder: Principal | ICD-10-CM

## 2017-10-19 DIAGNOSIS — G8929 Other chronic pain: Secondary | ICD-10-CM | POA: Diagnosis not present

## 2017-10-19 DIAGNOSIS — M6281 Muscle weakness (generalized): Secondary | ICD-10-CM

## 2017-10-19 DIAGNOSIS — M62838 Other muscle spasm: Secondary | ICD-10-CM | POA: Diagnosis not present

## 2017-10-19 DIAGNOSIS — R29898 Other symptoms and signs involving the musculoskeletal system: Secondary | ICD-10-CM | POA: Diagnosis not present

## 2017-10-19 NOTE — Therapy (Signed)
[facility] [address] [place], [place], [address] Phone: [phone]   Fax:  [phone]  Physical Therapy Treatment  Patient Details  Name: Nichole Brown MRN: [id number] Date of Birth: 01/31/1966 Referring Provider: Dondra Prader    Encounter Date: 10/19/2017  PT End of Session - 10/19/17 1700    Visit Number  7    Number of Visits  12    Date for PT Re-Evaluation  11/03/17    Authorization Type  [employer]    Authorization Time Period  09/22/17-11/03/17    PT Start Time  1602    PT Stop Time  1650    PT Time Calculation (min)  48 min    Activity Tolerance  Patient tolerated treatment well;No increased pain    Behavior During Therapy  WFL for tasks assessed/performed       Past Medical History:  Diagnosis Date  . Anxiety   . Bronchitis   . Depression   . Fibromyalgia 2005  . GERD (gastroesophageal reflux disease)   . Insomnia   . Migraine headache   . Pneumonia     Past Surgical History:  Procedure Laterality Date  . ABDOMINAL HYSTERECTOMY    . CESAREAN SECTION    . CHOLECYSTECTOMY  1988  . open heart surgery  1967  . PATENT DUCTUS ARTERIOUS REPAIR  1967  . SHOULDER SURGERY Left 2008   AC joint    There were no vitals filed for this visit.  Subjective Assessment - 10/19/17 1604    Subjective  Pt reports her shoulder is like "night and day" since starting PT. Her mobility is easier and she has very little pain. Pt returned to work 2 weeks ago and she says the shoulder is tolerating work activity better. When she has to push her cart at work, she still has some soreness in the shoulder. Pt reports to not have had LUE paresthesia since 1 WA.     Pertinent History  Left shoulder surgeyr in July 2018     Currently in Pain?  No/denies    Pain Score  -- mild ache/burning in anterior left shoulder.          HEP/THEREX REVIEWED THIS SESSION *all on left side only   -Prone T 1x10 -Prone Y 1x10 -Prone I  (extension) 1x10  -Biceps curls 1x10 c 8lb -standing abduction 1x10 c 5lb -standing scaption 1x10 c 4lb -skull crushers supine 1x10 c 5lb   Quadruped lateral weight shifting 1x10 bilat Quadruped A/P weight shifting 1x10   *discussed standing GHJ external rotation and sidelying Internal rotation.         PT Short Term Goals - 10/09/17 1515      PT SHORT TERM GOAL #1   Title  After 3 weeks patient will report decreased left shoulder pain at rest and during prolonger activity in home<4/10.     Status  Achieved      PT SHORT TERM GOAL #2   Title  After 3 weeks patient will demonstrate correct form and report good compliance with HEP for shoulder strengthening, postural correction, and pain management.     Status  Achieved      PT SHORT TERM GOAL #3   Title  After three weeks patient will demonstrate ability to perform job duties at work for up to 4 hours without exacerbation of pain or symptoms.     Baseline  Able to get through the mornign run without shoulder pain increasing;  as pain improves so does paresthesia.     Status  Achieved        PT Long Term Goals - 10/09/17 1510      PT LONG TERM GOAL #1   Title  After 6 weeks the patient will demonstrate improved Left shoulder ROM in IR/ER by 15 degrees, 20 degrees abduction, and 15 degrees flexion to improve ability to perform work activities and ADL.     Status  Achieved      PT LONG TERM GOAL #2   Title  After 6 weeks patient will demonstrate 4/5 or greater strength in LUE in all areas tested at evaluation to improve ability to perform work related activities.     Baseline  GHJ movers 5/5; posteriro deltoid and middle trap sare 3+/5 at reassessment     Status  On-going      PT LONG TERM GOAL #3   Title  After 6 weeks patient will verbalize importance of correct scapular posturaling for optimal shoulder function during bimanual tasks.     Status  Achieved            Plan - 10/19/17 1703    Clinical Impression  Statement  Complete HEP updated as patient is able to be progressed in several areas, both in weight and position. Pt performs all exercises without pain, good form awareness, mild shrug sign with standing scaption, but self corrected. Most difficult remains prone T and Y. Prone I (extension) added today with excellent response. D/T progress cancelled next patient and will see in 1 week. Added in some quadruped weight shifting activity, tolerated well without pain.     Rehab Potential  Good    PT Frequency  2x / week    PT Duration  6 weeks    PT Treatment/Interventions  Dry needling;Patient/family education;Electrical Stimulation;Cryotherapy;Moist Heat;Therapeutic activities;Therapeutic exercise;Neuromuscular re-education;Passive range of motion;Manual techniques    PT Next Visit Plan  review Prone T, I, and Y. Continue with quadruped weight shifting and serratus activation.     PT Home Exercise Plan  *see attached document    Consulted and Agree with Plan of Care  Patient       Patient will benefit from skilled therapeutic intervention in order to improve the following deficits and impairments:  Hypomobility, Decreased activity tolerance, Decreased strength, Impaired UE functional use, Pain, Increased muscle spasms, Improper body mechanics, Postural dysfunction  Visit Diagnosis: Chronic left shoulder pain  Stiffness of left shoulder, not elsewhere classified  Muscle weakness (generalized)  Other muscle spasm     Problem List Patient Active Problem List   Diagnosis Date Noted  . Impingement syndrome of left shoulder   . 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 Gastroesophageal reflux disease without esophagitis 12/26/2016  . Grief at loss of child 11/09/2016  . Sternal fracture 10/06/2015  . Contusion of leg 10/06/2015  . Migraine headache 07/24/2013  . Insomnia 05/30/2013  . Fibromyalgia 05/30/2013    5:21 PM, 10/19/17 Etta Grandchild, PT, DPT Physical Therapist  at [facility] [phone] (office)     Etta Grandchild 10/19/2017, 5:15 PM  [facility] [address] [place], [place], [address] Phone: [phone]   Fax:  [phone]  Name: Nichole Brown MRN: [id number] Date of Birth: Jan 30, 1966

## 2017-10-20 ENCOUNTER — Encounter (HOSPITAL_COMMUNITY): Payer: Self-pay

## 2017-10-23 ENCOUNTER — Encounter: Payer: Self-pay | Admitting: Family Medicine

## 2017-10-23 ENCOUNTER — Encounter (HOSPITAL_COMMUNITY): Payer: Self-pay

## 2017-10-23 ENCOUNTER — Ambulatory Visit (INDEPENDENT_AMBULATORY_CARE_PROVIDER_SITE_OTHER): Payer: 59 | Admitting: Family Medicine

## 2017-10-23 VITALS — BP 122/82 | Temp 98.4°F | Ht 62.0 in | Wt 170.4 lb

## 2017-10-23 DIAGNOSIS — J329 Chronic sinusitis, unspecified: Secondary | ICD-10-CM | POA: Diagnosis not present

## 2017-10-23 DIAGNOSIS — J310 Chronic rhinitis: Secondary | ICD-10-CM

## 2017-10-23 MED ORDER — HYDROCODONE-HOMATROPINE 5-1.5 MG/5ML PO SYRP: 5.0000 mL | ORAL_SOLUTION | Freq: Every evening | ORAL | 0 refills | Status: DC | PRN

## 2017-10-23 MED ORDER — ALBUTEROL SULFATE HFA 108 (90 BASE) MCG/ACT IN AERS: 2.0000 | INHALATION_SPRAY | Freq: Four times a day (QID) | RESPIRATORY_TRACT | 0 refills | Status: DC | PRN

## 2017-10-23 MED ORDER — CEFPROZIL 500 MG PO TABS: 500.0000 mg | ORAL_TABLET | Freq: Two times a day (BID) | ORAL | 0 refills | Status: DC

## 2017-10-23 NOTE — Progress Notes (Signed)
   Subjective:    Patient ID: Nichole Brown, female    DOB: 06/24/1966, 51 y.o.   MRN: [id number]  Cough  This is a new problem. The current episode started in the past 7 days. Associated symptoms include headaches and nasal congestion. She has tried OTC cough suppressant for the symptoms.   Yeast felt bad  Bad cough and achey and misery  Lots of cong and dranage and dim enrgy   Some low gr fever  tmax 102  Dim enrgy       Review of Systems  Respiratory: Positive for cough.   Neurological: Positive for headaches.       Objective:   Physical Exam    Alert, mild malaise. Hydration good Vitals stable. frontal/ maxillary tenderness evident positive nasal congestion. pharynx normal neck supple  lungs clear/no crackles or wheezes. heart regular in rhythm     Assessment & Plan:  Impression rhinosinusitis/acute bronchitis /likely related to parainfluenzalikely post viral, discussed with patient. plan antibiotics prescribed. Questions answered. Symptomatic care discussed. warning signs discussed. WSL

## 2017-10-27 ENCOUNTER — Telehealth (HOSPITAL_COMMUNITY): Payer: Self-pay

## 2017-10-27 ENCOUNTER — Ambulatory Visit (HOSPITAL_COMMUNITY): Payer: 59

## 2017-10-27 NOTE — Telephone Encounter (Signed)
Patient was sick all week and didn't do anything at all the whole week and want to cancel until next week.

## 2017-10-30 ENCOUNTER — Ambulatory Visit (HOSPITAL_COMMUNITY): Payer: 59

## 2017-11-06 ENCOUNTER — Telehealth (HOSPITAL_COMMUNITY): Payer: Self-pay

## 2017-11-06 NOTE — Telephone Encounter (Signed)
Pt called to reschedule miss apptment due to her having the flu last week. NF 11/06/17

## 2017-11-13 ENCOUNTER — Telehealth (HOSPITAL_COMMUNITY): Payer: Self-pay | Admitting: Family Medicine

## 2017-11-13 ENCOUNTER — Ambulatory Visit (HOSPITAL_COMMUNITY): Payer: 59

## 2017-11-13 NOTE — Telephone Encounter (Signed)
11/13/17  pt left a message at 2:40pm that she was still at work and couldn't come - does want to reschedule

## 2017-11-24 DIAGNOSIS — R51 Headache: Secondary | ICD-10-CM | POA: Diagnosis not present

## 2017-11-24 DIAGNOSIS — G43709 Chronic migraine without aura, not intractable, without status migrainosus: Secondary | ICD-10-CM | POA: Diagnosis not present

## 2018-01-04 ENCOUNTER — Telehealth: Payer: Self-pay | Admitting: Nurse Practitioner

## 2018-01-04 ENCOUNTER — Other Ambulatory Visit: Payer: Self-pay | Admitting: Nurse Practitioner

## 2018-01-04 ENCOUNTER — Encounter: Payer: Self-pay | Admitting: Family Medicine

## 2018-01-04 MED ORDER — NAPROXEN 500 MG PO TABS: ORAL_TABLET | ORAL | 0 refills | Status: DC

## 2018-01-04 NOTE — Telephone Encounter (Signed)
ERROR

## 2018-01-04 NOTE — Telephone Encounter (Signed)
Requesting refill on naproxen for her headaches.  [place]

## 2018-01-04 NOTE — Telephone Encounter (Signed)
Done

## 2018-01-09 DIAGNOSIS — Z0289 Encounter for other administrative examinations: Secondary | ICD-10-CM

## 2018-01-10 ENCOUNTER — Ambulatory Visit: Payer: 59 | Admitting: Family Medicine

## 2018-01-10 ENCOUNTER — Encounter: Payer: Self-pay | Admitting: Family Medicine

## 2018-01-10 VITALS — BP 112/82 | Ht 62.0 in | Wt 166.0 lb

## 2018-01-10 DIAGNOSIS — G43009 Migraine without aura, not intractable, without status migrainosus: Secondary | ICD-10-CM | POA: Diagnosis not present

## 2018-01-10 NOTE — Progress Notes (Signed)
   Subjective:    Patient ID: Nichole Brown, female    DOB: [birth year], 52 y.o.   MRN: [id number]  HPI  Patient is here today wanting a renewal on her FMLA forms.Nichole Brown Has them up front).   Wants to discuss her headaches and the topamax dosing, she sees a neurologist for this.   Headaches flared up recently with soe of the changes in weather  See dr Nichole Brown neurologist   thwn hey rec incr topamax     Pt has trouble with dizzines with the higher dose of the topamax  They alos rec magnesium supplement, overall seems better,  Uses the migr more prn basis  Uses maxalt equiv at least once per month        Review of Systems No headache, no major weight loss or weight gain, no chest pain no back pain abdominal pain no change in bowel habits complete ROS otherwise negative     Objective:   Physical Exam  Alert vitals stable, NAD. Blood pressure good on repeat. HEENT normal. Lungs clear. Heart regular rate and rhythm. Neuro exam intact no focal neurological deficits      Assessment & Plan:  Impression recurrent migraine headaches.  Discussed at length.  Overall decent control.  However when they hit they become fairly severe and patient is not able to work.  She needs to maintain her FMLA status neurology at work when having a severe flare

## 2018-01-11 ENCOUNTER — Telehealth: Payer: Self-pay | Admitting: Family Medicine

## 2018-01-11 NOTE — Telephone Encounter (Signed)
Patient dropped off FMLA to be updated. Please review,date and sign. In your yellow folder

## 2018-01-27 ENCOUNTER — Encounter (HOSPITAL_COMMUNITY): Payer: Self-pay | Admitting: Emergency Medicine

## 2018-01-27 ENCOUNTER — Emergency Department (HOSPITAL_COMMUNITY): Payer: 59

## 2018-01-27 ENCOUNTER — Other Ambulatory Visit: Payer: Self-pay

## 2018-01-27 ENCOUNTER — Emergency Department (HOSPITAL_COMMUNITY)
Admission: EM | Admit: 2018-01-27 | Discharge: 2018-01-27 | Disposition: A | Discharge status: Home / Self Care | Payer: 59 | Attending: Emergency Medicine | Admitting: Emergency Medicine

## 2018-01-27 DIAGNOSIS — W228XXA Striking against or struck by other objects, initial encounter: Secondary | ICD-10-CM | POA: Diagnosis not present

## 2018-01-27 DIAGNOSIS — M25512 Pain in left shoulder: Secondary | ICD-10-CM | POA: Insufficient documentation

## 2018-01-27 DIAGNOSIS — G8911 Acute pain due to trauma: Secondary | ICD-10-CM | POA: Diagnosis not present

## 2018-01-27 DIAGNOSIS — M791 Myalgia, unspecified site: Secondary | ICD-10-CM | POA: Diagnosis not present

## 2018-01-27 DIAGNOSIS — Y990 Civilian activity done for income or pay: Secondary | ICD-10-CM | POA: Insufficient documentation

## 2018-01-27 DIAGNOSIS — Y9301 Activity, walking, marching and hiking: Secondary | ICD-10-CM | POA: Diagnosis not present

## 2018-01-27 DIAGNOSIS — S4992XA Unspecified injury of left shoulder and upper arm, initial encounter: Secondary | ICD-10-CM | POA: Diagnosis present

## 2018-01-27 DIAGNOSIS — Z79899 Other long term (current) drug therapy: Secondary | ICD-10-CM | POA: Insufficient documentation

## 2018-01-27 DIAGNOSIS — Y9259 Other trade areas as the place of occurrence of the external cause: Secondary | ICD-10-CM | POA: Diagnosis not present

## 2018-01-27 MED ORDER — METHOCARBAMOL 500 MG PO TABS: 500.0000 mg | ORAL_TABLET | Freq: Two times a day (BID) | ORAL | 0 refills | Status: DC

## 2018-01-27 MED ORDER — LIDOCAINE 1.8 % EX PTCH: 1.0000 | MEDICATED_PATCH | Freq: Every day | CUTANEOUS | 0 refills | Status: DC

## 2018-01-27 NOTE — Discharge Instructions (Signed)
It was my pleasure taking care of you today!  Ibuprofen 600 mg as needed for pain. Ice shoulder throughout the day (instructions below).  Wear shoulder sling for no more than 3 days, then begin performing gentle range of motion exercises.   You are prescribed Robaxin, a muscle relaxant. Some common side effects of this medication include:  Feeling sleepy.  Dizziness. Take care upon going from a seated to a standing position.  Dry mouth.  Feeling tired or weak.  Hard stools (constipation).  Upset stomach. These are not all of the side effects that may occur. If you have questions about side effects, call your doctor. Call your primary care provider for medical advice about side effects.  This medication can be sedating. Only take this medication as needed. Please do not combine with alcohol. Do not drive or operate machinery while taking this medication.   This medication can interact with some other medications. Make sure to tell any provider you are taking this medication before they prescribe you a new medication.    Call the orthopedist listed today or tomorrow to schedule a follow up appointment for recheck of ongoing shoulder pain in 1-2 weeks that can be canceled with a 24-48 hour notice if complete resolution of pain.    Call the orthopedist listed if symptoms are not improved in one week.   Return to the ER for new or worsening symptoms, any additional concerns.  COLD THERAPY DIRECTIONS:  Ice or gel packs can be used to reduce both pain and swelling. Ice is the most helpful within the first 24 to 48 hours after an injury or flareup from overusing a muscle or joint.  Ice is effective, has very few side effects, and is safe for most people to use.   If you expose your skin to cold temperatures for too long or without the proper protection, you can damage your skin or nerves. Watch for signs of skin damage due to cold.   HOME CARE INSTRUCTIONS  Follow these tips to use ice and cold  packs safely.  Place a dry or damp towel between the ice and skin. A damp towel will cool the skin more quickly, so you may need to shorten the time that the ice is used.  For a more rapid response, add gentle compression to the ice.  Ice for no more than 10 to 20 minutes at a time. The bonier the area you are icing, the less time it will take to get the benefits of ice.  Check your skin after 5 minutes to make sure there are no signs of a poor response to cold or skin damage.  Rest 20 minutes or more in between uses.  Once your skin is numb, you can end your treatment. You can test numbness by very lightly touching your skin. The touch should be so light that you do not see the skin dimple from the pressure of your fingertip. When using ice, most people will feel these normal sensations in this order: cold, burning, aching, and numbness.   Please also apply heat to the shoulder.  Heat will help relax the muscles.  You may alternate heat and cold.

## 2018-01-27 NOTE — ED Triage Notes (Signed)
Pt reports hitting her L shoulder on a bathroom stall yesterday, had surgery on this extremity in July.

## 2018-01-27 NOTE — ED Provider Notes (Signed)
[facility] EMERGENCY DEPARTMENT Provider Note   CSN: [id number] Arrival date & time: 01/27/18  1153     History   Chief Complaint Chief Complaint  Patient presents with  . Shoulder Pain    Left    HPI Nichole Brown is a 52 y.o. female.  HPI   Patient is a 52 year old female with a history of migraine headaches, bronchitis, depression, anxiety, and fibromyalgia presenting for left shoulder injury.  Patient reports that she has been treated recurrently for rotator cuff tendinopathy by Dr. Sharol Given of [facility] orthopedics.  Patient reports that she also rotator cuff arthroscopy in July 2018.  Patient reports she has graduated from physical therapy without difficulty and has returned to work.  Patient reports that yesterday she was walking on a bathroom stall and hit her anterior left shoulder on the stall as well as was hit from the back by the door.  Patient reports that subsequently she has had decreased range of motion in all directions from the shoulder.  Patient reports a subjective feeling of swelling.  No erythema or ecchymosis that she has noted.  Patient reports that she feels tingling down her left arm, but no loss of sensation or weakness.  Patient tried ice and heat for symptoms last night.  Past Medical History:  Diagnosis Date  . Anxiety   . Bronchitis   . Depression   . Fibromyalgia 2005  . GERD (gastroesophageal reflux disease)   . Insomnia   . Migraine headache   . Pneumonia     Patient Active Problem List   Diagnosis Date Noted  . Impingement syndrome of left shoulder   . 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 Gastroesophageal reflux disease without esophagitis 12/26/2016  . Grief at loss of child 11/09/2016  . Sternal fracture 10/06/2015  . Contusion of leg 10/06/2015  . Migraine headache 07/24/2013  . Insomnia 05/30/2013  . Fibromyalgia 05/30/2013    Past Surgical History:  Procedure Laterality Date  . ABDOMINAL HYSTERECTOMY    .  CESAREAN SECTION    . CHOLECYSTECTOMY  1988  . open heart surgery  1967  . PATENT DUCTUS ARTERIOUS REPAIR  1967  . SHOULDER ARTHROSCOPY Left 06/16/2017   Procedure: LEFT SHOULDER ARTHROSCOPY, DEBRIDEMENT, AND DECOMPRESSION;  Surgeon: Newt Minion, MD;  Location: [address];  Service: Orthopedics;  Laterality: Left;  . SHOULDER SURGERY Left 2008   AC joint    OB History    No data available       Home Medications    Prior to Admission medications   Medication Sig Start Date End Date Taking? Authorizing Provider  albuterol (PROVENTIL HFA;VENTOLIN HFA) 108 (90 Base) MCG/ACT inhaler Inhale 2 puffs every 6 (six) hours as needed into the lungs. 10/23/17  Yes Mikey Kirschner, MD  ALPRAZolam Duanne Moron) 0.5 MG tablet TAKE ONE TABLET AT BEDTIME AS NEEDED FOR SLEEP 10/03/17  Yes Mikey Kirschner, MD  naproxen (NAPROSYN) 500 MG tablet TAKE ONE TABLET BY MOUTH TWICE A DAY WITH A MEAL AS NEEDED FOR HEADACHE 01/04/18  Yes Nilda Simmer, NP  ondansetron (ZOFRAN-ODT) 4 MG disintegrating tablet Take 1 tablet (4 mg total) by mouth every 8 (eight) hours as needed for nausea or vomiting. 06/12/17  Yes Cuthriell, Charline Bills, PA-C  pantoprazole (PROTONIX) 40 MG tablet Take 1 tablet (40 mg total) by mouth daily. For acid reflux Patient taking differently: Take 40 mg by mouth daily as needed. For acid reflux 12/26/16  Yes Nilda Simmer,  NP  rizatriptan (MAXALT-MLT) 10 MG disintegrating tablet Take 10 mg by mouth as needed for migraine. May repeat in 2 hours if needed   Yes [provider]  topiramate (TOPAMAX) 25 MG tablet Take 25 mg by mouth daily.   Yes [provider]  gabapentin (NEURONTIN) 100 MG capsule Take 1 capsule (100 mg total) by mouth 3 (three) times daily. When necessary for neuropathy pain Patient not taking: Reported on 01/10/2018 09/14/17   Newt Minion, MD  HYDROcodone-homatropine [facility]) 5-1.5 MG/5ML syrup Take 5 mLs at bedtime as needed by mouth for cough. Patient not  taking: Reported on 01/10/2018 10/23/17   Mikey Kirschner, MD  Lidocaine 1.8 % PTCH Apply 1 patch topically daily. 01/27/18   Langston Masker B, PA-C  Lifitegrast (XIIDRA) 5 % SOLN Place 1 drop into both eyes 2 (two) times daily.    [provider]  methocarbamol (ROBAXIN) 500 MG tablet Take 1 tablet (500 mg total) by mouth 2 (two) times daily. 01/27/18   Albesa Seen, PA-C    Family History Family History  Problem Relation Age of Onset  . Hypertension Maternal Grandmother   . Diabetes Maternal Grandmother   . Heart disease Maternal Grandmother 40       MI  . Hypertension Maternal Grandfather   . Throat cancer Maternal Grandfather   . Breast cancer Mother 97  . Hypertension Father     Social History Social History   Tobacco Use  . Smoking status: Never Smoker  . Smokeless tobacco: Never Used  Substance Use Topics  . Alcohol use: Yes    Comment: occassionally  . Drug use: No     Allergies   Cymbalta [duloxetine hcl] and Nortriptyline   Review of Systems Review of Systems  Musculoskeletal: Positive for arthralgias and myalgias. Negative for joint swelling, neck pain and neck stiffness.  Neurological: Negative for weakness and numbness.     Physical Exam Updated Vital Signs BP (!) 110/55 (BP Location: Right Arm)   Pulse 70   Temp 97.7 F (36.5 C) (Oral)   Resp 16   Ht 5\' 2"  (1.575 m)   Wt 75.3 kg (166 lb)   SpO2 100%   BMI 30.36 kg/m   Physical Exam  Constitutional: She appears well-developed and well-nourished. No distress.  Sitting comfortably in bed.  HENT:  Head: Normocephalic and atraumatic.  Eyes: Conjunctivae are normal. Right eye exhibits no discharge. Left eye exhibits no discharge.  EOMs normal to gross examination.  Neck: Normal range of motion.  Cardiovascular: Normal rate and regular rhythm.  Intact, 2+ radial pulse of LUE.  Pulmonary/Chest:  Normal respiratory effort. Patient converses comfortably. No audible wheeze or stridor.    Abdominal: She exhibits no distension.  Musculoskeletal:  Left3 shoulder with tenderness to palpation of anterior shoulder and rotator cuff insertion sites on humerus.  Decreased range of motion particularly with forward flexion, abduction, and external rotation.+ empty can test, + Neer's. No swelling, erythema or ecchymosis present. No step-off, crepitus, or deformity appreciated. 5/5 muscle strength of UE. 2+ radial pulse, sensation intact and all compartments soft.  Neurological: She is alert.  Cranial nerves intact to gross observation. Patient moves extremities without difficulty.  Skin: Skin is warm and dry. She is not diaphoretic.  Psychiatric: She has a normal mood and affect. Her behavior is normal. Judgment and thought content normal.  Nursing note and vitals reviewed.    ED Treatments / Results  Labs (all labs ordered are listed, but  only abnormal results are displayed) Labs Reviewed - No data to display  EKG  EKG Interpretation None       Radiology Dg Shoulder Left  Result Date: 01/27/2018 CLINICAL DATA:  Left shoulder pain. EXAM: LEFT SHOULDER - 2+ VIEW COMPARISON:  None FINDINGS: There is no evidence of fracture or dislocation. There is no evidence of arthropathy or other focal bone abnormality. Soft tissues are unremarkable. IMPRESSION: Negative. Electronically Signed   By: Kerby Moors M.D.   On: 01/27/2018 13:01    Procedures Procedures (including critical care time)  Medications Ordered in ED Medications - No data to display   Initial Impression / Assessment and Plan / ED Course  I have reviewed the triage vital signs and the nursing notes.  Pertinent labs & imaging results that were available during my care of the patient were reviewed by me and considered in my medical decision making (see chart for details).     Patient is nontoxic-appearing and in no acute distress.  Patient exhibits signs and symptoms consistent with rotator cuff tendinopathy  and/or acute rotator cuff tear.  No step-off or crepitus appreciated over the site of pain.  Suspect patient reinjured the site she is previously suffered from tendinopathy, most likely supraspinatus.  The patient is neurovascularly intact in the left upper extremity.  Given diffuse muscle spasming in the left upper extremity and trapezius, will provide patient a short course of Robaxin.  Patient also provided prescription for lidocaine patches.  Patient instructed to follow-up as soon as possible with Dr. Sharol Given for reassessment of her tendinopathy.  Patient return precautions for any weakness, numbness, pallor or other color change of the left upper extremity.  Patient is in understanding and agree with the plan of care.  Final Clinical Impressions(s) / ED Diagnoses   Final diagnoses:  Acute pain of left shoulder    ED Discharge Orders        Ordered    Lidocaine 1.8 % PTCH  Daily     01/27/18 1529    methocarbamol (ROBAXIN) 500 MG tablet  2 times daily     01/27/18 1529       Tamala Julian 01/27/18 1754    Pattricia Boss, MD 01/28/18 [phone]

## 2018-01-29 ENCOUNTER — Telehealth (INDEPENDENT_AMBULATORY_CARE_PROVIDER_SITE_OTHER): Payer: Self-pay | Admitting: Orthopedic Surgery

## 2018-01-29 NOTE — Telephone Encounter (Signed)
Done. Patient aware CD is ready for pickup 

## 2018-01-29 NOTE — Telephone Encounter (Signed)
Patient called asking for a CD with all her xrays and MRI's. CB # [phone]

## 2018-01-31 DIAGNOSIS — M25512 Pain in left shoulder: Secondary | ICD-10-CM | POA: Diagnosis not present

## 2018-01-31 DIAGNOSIS — M7502 Adhesive capsulitis of left shoulder: Secondary | ICD-10-CM | POA: Diagnosis not present

## 2018-02-08 ENCOUNTER — Other Ambulatory Visit: Payer: Self-pay

## 2018-02-08 ENCOUNTER — Ambulatory Visit (HOSPITAL_COMMUNITY): Payer: 59 | Attending: Orthopedic Surgery | Admitting: Specialist

## 2018-02-08 DIAGNOSIS — G8929 Other chronic pain: Secondary | ICD-10-CM | POA: Diagnosis not present

## 2018-02-08 DIAGNOSIS — R29898 Other symptoms and signs involving the musculoskeletal system: Secondary | ICD-10-CM | POA: Insufficient documentation

## 2018-02-08 DIAGNOSIS — M25512 Pain in left shoulder: Secondary | ICD-10-CM | POA: Diagnosis not present

## 2018-02-08 DIAGNOSIS — M25612 Stiffness of left shoulder, not elsewhere classified: Secondary | ICD-10-CM | POA: Insufficient documentation

## 2018-02-08 NOTE — Patient Instructions (Signed)

## 2018-02-09 NOTE — Therapy (Signed)
[place], [place], [address] Phone: [phone]   Fax:  [phone]  Occupational Therapy Evaluation  Patient Details  Name: Nichole Brown MRN: [id number] Date of Birth: Jun 04, 1966 Referring Provider: Jenetta Loges PA   Encounter Date: 02/08/2018  OT End of Session - 02/08/18 2133    Visit Number  1    Number of Visits  16    Date for OT Re-Evaluation  04/09/18 mini reassess on 3/30    Authorization Type  [facility] - no visit limit    OT Start Time  0254    OT Stop Time  1600 patient arrived late for evaluation    OT Time Calculation (min)  28 min    Activity Tolerance  Patient tolerated treatment well    Behavior During Therapy  [facility] for tasks assessed/performed       Past Medical History:  Diagnosis Date  . Anxiety   . Bronchitis   . Depression   . Fibromyalgia 2005  . GERD (gastroesophageal reflux disease)   . Insomnia   . Migraine headache   . Pneumonia     Past Surgical History:  Procedure Laterality Date  . ABDOMINAL HYSTERECTOMY    . CESAREAN SECTION    . CHOLECYSTECTOMY  1988  . open heart surgery  1967  . PATENT DUCTUS ARTERIOUS REPAIR  1967  . SHOULDER ARTHROSCOPY Left 06/16/2017   Procedure: LEFT SHOULDER ARTHROSCOPY, DEBRIDEMENT, AND DECOMPRESSION;  Surgeon: Newt Minion, MD;  Location: [facility];  Service: Orthopedics;  Laterality: Left;  . SHOULDER SURGERY Left 2008   AC joint    There were no vitals filed for this visit.  Subjective Assessment - 02/08/18 2126    Subjective   S:  I want to get rid of the pain.    Pertinent History  Ms. Keitt had rotator cuff surgery in June 2018.  After surgery, she had therapy at this clinic, including dry needling, and cortisone injections from MD.  She did not have any relief from her left arm pain and numbness.  On 01/26/18 she ran into a door jam with the anterior portion of her left shoulder.  Since that time, she has had decreased mobility and  increasd pain.  She recieved a cortisone injection in her shoulder joint last week, which has given her the first real relief in her symptoms in some time.  She has been referred to occupational therapy for evaluatin and treatment.      Special Tests  FOTO 49.76    Patient Stated Goals  I want to get the pain taken care of.    Currently in Pain?  Yes    Pain Score  7     Pain Location  Shoulder    Pain Orientation  Right;Anterior    Pain Descriptors / Indicators  Aching;Numbness;Sharp    Pain Type  Acute pain    Pain Onset  More than a month ago    Pain Frequency  Intermittent    Aggravating Factors   external rotation    Pain Relieving Factors  n/a    Effect of Pain on Daily Activities  moderate        [facility] OT Assessment - 02/09/18 0001      Assessment   Medical Diagnosis  Left shoulder pain, LUE paresthesia     Hand Dominance  Right    Prior Therapy  OT and PT       Precautions  Precautions  None      Restrictions   Weight Bearing Restrictions  No      Balance Screen   Has the patient fallen in the past 6 months  Yes    How many times?  1    Has the patient had a decrease in activity level because of a fear of falling?   No    Is the patient reluctant to leave their home because of a fear of falling?   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, draws blood samples    Leisure  spending time with family      ADL   ADL comments  Patient having difficulty reaching above shoulder height, externally rotating arm to feel for patients veins, fixing her hair and weightbearing on her left arm       Written Expression   Dominant Hand  Right      Vision - History   Baseline Vision  Wears glasses all the time      Cognition    Overall Cognitive Status  Within Functional Limits for tasks assessed      Observation/Other Assessments   Focus on Therapeutic Outcomes (FOTO)   49.76      Sensation   Light Touch  Appears Intact    Additional Comments  patient reports tingling sensation in her entire left upper extremity that has been present since her surgery in 2018      Coordination   Gross Motor Movements are Fluid and Coordinated  Yes    Fine Motor Movements are Fluid and Coordinated  Yes      ROM / Strength   AROM / PROM / Strength  AROM;PROM;Strength      Palpation   Palpation comment  moderate fascial restrictions noted in her left scapular, shoulder, and upper left arm      AROM   Overall AROM Comments  assessed in seated, external and internal rotation with shoulder adducted    AROM Assessment Site  Shoulder    Right/Left Shoulder  Left    Left Shoulder Flexion  116 Degrees    Left Shoulder ABduction  95 Degrees    Left Shoulder Internal Rotation  80 Degrees    Left Shoulder External Rotation  40 Degrees      PROM   Overall PROM Comments  assessed in supine, flexion and abuction are WNL, external rotation and internal rotation are 75%       Strength   Overall Strength Comments  assessed in seated external and internal rotation with shoulder adducted     Right/Left Shoulder  Left    Left Shoulder Flexion  4-/5    Left Shoulder ABduction  3+/5    Left Shoulder Internal Rotation  4+/5    Left Shoulder External Rotation  4+/5                      OT Education - 02/08/18 2132    Education provided  Yes    Education Details  shoulder stretches    Person(s) Educated  Patient    Methods  Explanation;Demonstration;Handout    Comprehension  Verbalized understanding  OT Short Term Goals - 02/08/18 2139      OT SHORT TERM GOAL #1   Title  Pt will be educated on and independent in HEP to improve functional use of LUE during daily tasks.     Time  4    Period  Weeks    Status   New    Target Date  03/10/18      OT SHORT TERM GOAL #2   Title  Pt will improve LUE P/ROM to WNL to increase ability to use LUE as assist during dressing tasks.     Time  4    Period  Weeks    Status  New      OT SHORT TERM GOAL #3   Title  Pt will decrease pain in LUE to 5/10 to improve ability to sleep at night.     Time  4    Period  Weeks    Status  New      OT SHORT TERM GOAL #4   Title  Pt will improve LUE strength to 4-/5 to increase ability to use LUE as assist when driving.     Time  4    Period  Weeks    Status  New        OT Long Term Goals - 02/08/18 2140      OT LONG TERM GOAL #1   Title  Pt will return to highest level of functioning during B/IADL tasks using LUE as non-dominant.     Time  8    Period  Weeks    Status  New    Target Date  04/09/18      OT LONG TERM GOAL #2   Title  Pt will decrease LUE pain to 3/10 or less to improve ability to use LUE during grooming tasks such as washing hair.     Time  8    Period  Weeks    Status  New      OT LONG TERM GOAL #3   Title  Pt will decrease fascial restrictions in LUE from mod/max to minimal amounts to improve mobility required for functional reaching tasks.     Time  8    Period  Weeks    Status  New      OT LONG TERM GOAL #4   Title  Pt will improve LUE A/ROM to [facility] to increase ability to perform overhead reaching tasks at home and work.     Time  8    Period  Weeks    Status  New      OT LONG TERM GOAL #5   Title  Pt will improve LUE strength to 5/5 to increase ability to pull/push heavy cart at work.     Time  8    Period  Weeks    Status  New            Plan - 02/08/18 2134    Clinical Impression Statement  A:  Patient is a 52 year old female with past medical history signficant for anxiety, shoulder surgery.  Patient has been experiencing pain and numbness in her left shoulder and arm since her rotator cuff surgery in 2018.  She knocked her shoulder against a stall, and has had  increasd pain and decreased mobility in her left anterior shoulder since that time.  Patient will benefit from skilled OT intervention to decrease pain and restrictions and improve pain free mobility in her left  shoulder region.      Occupational performance deficits (Please refer to evaluation for details):  ADL's;IADL's;Work;Play;Leisure;Social Participation    Rehab Potential  Good    OT Frequency  2x / week    OT Duration  6 weeks    OT Treatment/Interventions  Self-care/ADL training;Cryotherapy;Therapeutic exercise;DME and/or AE instruction;Manual Therapy;Ultrasound;Electrical Stimulation;Neuromuscular education;Patient/family education;Therapeutic activities;Passive range of motion;Energy conservation;Iontophoresis    Plan  P:  Skilled OT intervention to decrease pan and restrictions and improve pain free mobility needed to use left upper extremity noramlly when completing all aily activities.     Clinical Decision Making  Limited treatment options, no task modification necessary    OT Home Exercise Plan  shoulder stretches    Consulted and Agree with Plan of Care  Patient       Patient will benefit from skilled therapeutic intervention in order to improve the following deficits and impairments:  Decreased skin integrity, Increased muscle spasms, Pain, Impaired perceived functional ability, Decreased range of motion, Decreased strength, Impaired UE functional use  Visit Diagnosis: Stiffness of left shoulder, not elsewhere classified - Plan: Ot plan of care cert/re-cert  Chronic left shoulder pain - Plan: Ot plan of care cert/re-cert  Other symptoms and signs involving the musculoskeletal system - Plan: Ot plan of care cert/re-cert    Problem List Patient Active Problem List   Diagnosis Date Noted  . Impingement syndrome of left shoulder   . 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 Gastroesophageal reflux disease without esophagitis 12/26/2016  . Grief at loss of child  11/09/2016  . Sternal fracture 10/06/2015  . Contusion of leg 10/06/2015  . Migraine headache 07/24/2013  . Insomnia 05/30/2013  . Fibromyalgia 05/30/2013    Vangie Bicker, [place], OTR/L [phone]  02/09/2018, 8:18 AM  [place], [place], [address] Phone: [phone]   Fax:  [phone]  Name: Nichole Brown MRN: [id number] Date of Birth: 06-Jan-1966

## 2018-02-19 ENCOUNTER — Encounter (HOSPITAL_COMMUNITY): Payer: Self-pay | Admitting: Specialist

## 2018-02-19 ENCOUNTER — Ambulatory Visit (HOSPITAL_COMMUNITY): Payer: 59 | Attending: Orthopedic Surgery | Admitting: Specialist

## 2018-02-19 DIAGNOSIS — M25512 Pain in left shoulder: Secondary | ICD-10-CM | POA: Insufficient documentation

## 2018-02-19 DIAGNOSIS — R29898 Other symptoms and signs involving the musculoskeletal system: Secondary | ICD-10-CM | POA: Insufficient documentation

## 2018-02-19 DIAGNOSIS — M25612 Stiffness of left shoulder, not elsewhere classified: Secondary | ICD-10-CM | POA: Diagnosis not present

## 2018-02-19 DIAGNOSIS — G8929 Other chronic pain: Secondary | ICD-10-CM | POA: Insufficient documentation

## 2018-02-19 NOTE — Therapy (Signed)
[place], [place], [address] Phone: [phone]   Fax:  [phone]  Occupational Therapy Treatment  Patient Details  Name: Nichole Brown MRN: [id number] Date of Birth: Jun 11, 1966 Referring Provider: Jenetta Loges PA   Encounter Date: 02/19/2018  OT End of Session - 02/19/18 1544    Visit Number  2    Number of Visits  16    Date for OT Re-Evaluation  04/09/18 mini reassess 3/30    Authorization Type  [facility] - no visit limit    OT Start Time  1522    OT Stop Time  1605    OT Time Calculation (min)  43 min       Past Medical History:  Diagnosis Date  . Anxiety   . Bronchitis   . Depression   . Fibromyalgia 2005  . GERD (gastroesophageal reflux disease)   . Insomnia   . Migraine headache   . Pneumonia     Past Surgical History:  Procedure Laterality Date  . ABDOMINAL HYSTERECTOMY    . CESAREAN SECTION    . CHOLECYSTECTOMY  1988  . open heart surgery  1967  . PATENT DUCTUS ARTERIOUS REPAIR  1967  . SHOULDER ARTHROSCOPY Left 06/16/2017   Procedure: LEFT SHOULDER ARTHROSCOPY, DEBRIDEMENT, AND DECOMPRESSION;  Surgeon: Newt Minion, MD;  Location: [facility];  Service: Orthopedics;  Laterality: Left;  . SHOULDER SURGERY Left 2008   AC joint    There were no vitals filed for this visit.  Subjective Assessment - 02/19/18 1528    Subjective   S:  Its feeling much better.  I can lay on it better and at work it doesnt hurt as much and if it does if I rest it the pain goes away.     Currently in Pain?  Yes    Pain Score  1     Pain Location  Shoulder    Pain Orientation  Anterior;Left         [facility] OT Assessment - 02/19/18 0001      Assessment   Medical Diagnosis  Left shoulder pain, LUE paresthesia       Precautions   Precautions  None               OT Treatments/Exercises (OP) - 02/19/18 0001      Exercises   Exercises  Shoulder      Shoulder Exercises: Supine   Protraction   PROM;AROM;10 reps    Horizontal ABduction  PROM;AROM;10 reps    External Rotation  PROM;AAROM;10 reps    Internal Rotation  PROM;AROM;10 reps    Flexion  PROM;AROM;10 reps    ABduction  PROM;AROM;10 reps    Other Supine Exercises  star gazer stretch 5 times 10 second hold      Shoulder Exercises: Seated   External Rotation  AROM;10 reps    External Rotation Limitations  arm abducted    Internal Rotation  AROM;10 reps    Internal Rotation Limitations  arm abducted       Shoulder Exercises: Standing   External Rotation  Theraband;10 reps    Theraband Level (Shoulder External Rotation)  Level 2 (Red)    External Rotation Limitations  eccentric contraction from internal rotation     Internal Rotation  Theraband;10 reps    Theraband Level (Shoulder Internal Rotation)  Level 2 (Red)    Extension  Theraband;10 reps    Theraband Level (Shoulder Extension)  Level 2 (Red)  Row  Theraband;10 reps    Theraband Level (Shoulder Row)  Level 2 (Red)    Retraction  Theraband;10 reps    Theraband Level (Shoulder Retraction)  Level 2 (Red)      Shoulder Exercises: Therapy Ball   Right/Left  5 reps    Right/Left Limitations  more difficult leading with left arm      Manual Therapy   Manual Therapy  Myofascial release    Manual therapy comments  manual therapy completed seperately from all other interventions this date    Myofascial Release  myofascial release and manual stretching to left upper arm, anterior and posterior shoulder and associated areas to decrease restrictions and improve pain free mobilty             OT Education - 02/19/18 1642    Education provided  Yes    Education Details  reviewed plan of care and hep, issued a written copy of plan of care    Person(s) Educated  Patient    Methods  Explanation;Demonstration;Handout    Comprehension  Verbalized understanding       OT Short Term Goals - 02/19/18 1645      OT SHORT TERM GOAL #1   Title  Pt will be educated on  and independent in HEP to improve functional use of LUE during daily tasks.     Time  4    Period  Weeks    Status  On-going      OT SHORT TERM GOAL #2   Title  Pt will improve LUE P/ROM to WNL to increase ability to use LUE as assist during dressing tasks.     Time  4    Period  Weeks    Status  On-going      OT SHORT TERM GOAL #3   Title  Pt will decrease pain in LUE to 5/10 to improve ability to sleep at night.     Time  4    Period  Weeks    Status  On-going      OT SHORT TERM GOAL #4   Title  Pt will improve LUE strength to 4-/5 to increase ability to use LUE as assist when driving.     Time  4    Period  Weeks    Status  On-going      OT SHORT TERM GOAL #5   Status  On-going        OT Long Term Goals - 02/19/18 1645      OT LONG TERM GOAL #1   Title  Pt will return to highest level of functioning during B/IADL tasks using LUE as non-dominant.     Time  8    Period  Weeks    Status  On-going      OT LONG TERM GOAL #2   Title  Pt will decrease LUE pain to 3/10 or less to improve ability to use LUE during grooming tasks such as washing hair.     Time  8    Period  Weeks    Status  On-going      OT LONG TERM GOAL #3   Title  Pt will decrease fascial restrictions in LUE from mod/max to minimal amounts to improve mobility required for functional reaching tasks.     Time  8    Period  Weeks    Status  On-going      OT LONG TERM GOAL #4   Title  Pt will  improve LUE A/ROM to [employer] to increase ability to perform overhead reaching tasks at home and work.     Time  8    Period  Weeks    Status  On-going      OT LONG TERM GOAL #5   Title  Pt will improve LUE strength to 5/5 to increase ability to pull/push heavy cart at work.     Time  8    Period  Weeks    Status  On-going            Plan - 02/19/18 1643    Clinical Impression Statement  A:  Patient with noted improvement in pain level and A/ROM this date.  Patient is not able to externally rotate with  shoulder adducted, and is able to do so with arm abducted.  Internal rotation is painful and limited in abducted position.      Plan  P:  Focus on decreasing tightness and pain in internal rotation which is limiting patient's external rotation.         Patient will benefit from skilled therapeutic intervention in order to improve the following deficits and impairments:  Decreased skin integrity, Increased muscle spasms, Pain, Impaired perceived functional ability, Decreased range of motion, Decreased strength, Impaired UE functional use  Visit Diagnosis: Chronic left shoulder pain  Stiffness of left shoulder, not elsewhere classified  Other symptoms and signs involving the musculoskeletal system    Problem List Patient Active Problem List   Diagnosis Date Noted  . Impingement syndrome of left shoulder   . 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 Gastroesophageal reflux disease without esophagitis 12/26/2016  . Grief at loss of child 11/09/2016  . Sternal fracture 10/06/2015  . Contusion of leg 10/06/2015  . Migraine headache 07/24/2013  . Insomnia 05/30/2013  . Fibromyalgia 05/30/2013    Vangie Bicker, [place], OTR/L [phone]  02/19/2018, 4:50 PM  [facility] [address] [place], [place], [address] Phone: [phone]   Fax:  [phone]  Name: Nichole Brown MRN: [id number] Date of Birth: 1966/01/04

## 2018-02-21 ENCOUNTER — Encounter (HOSPITAL_COMMUNITY): Payer: Self-pay | Admitting: Occupational Therapy

## 2018-02-21 ENCOUNTER — Ambulatory Visit (HOSPITAL_COMMUNITY): Payer: 59 | Admitting: Occupational Therapy

## 2018-02-21 DIAGNOSIS — M25612 Stiffness of left shoulder, not elsewhere classified: Secondary | ICD-10-CM

## 2018-02-21 DIAGNOSIS — M25512 Pain in left shoulder: Principal | ICD-10-CM

## 2018-02-21 DIAGNOSIS — G8929 Other chronic pain: Secondary | ICD-10-CM | POA: Diagnosis not present

## 2018-02-21 DIAGNOSIS — R29898 Other symptoms and signs involving the musculoskeletal system: Secondary | ICD-10-CM

## 2018-02-21 NOTE — Therapy (Signed)
[place], [place], [address] Phone: [phone]   Fax:  [phone]  Occupational Therapy Treatment  Patient Details  Name: Nichole Brown MRN: [id number] Date of Birth: Aug 25, 1966 Referring Provider: Jenetta Loges PA   Encounter Date: 02/21/2018  OT End of Session - 02/21/18 1708    Visit Number  3    Number of Visits  16    Date for OT Re-Evaluation  04/09/18 mini reassess 3/30    Authorization Type  [facility]R - no visit limit    OT Start Time  1518    OT Stop Time  1557    OT Time Calculation (min)  39 min    Activity Tolerance  Patient tolerated treatment well    Behavior During Therapy  [facility] for tasks assessed/performed       Past Medical History:  Diagnosis Date  . Anxiety   . Bronchitis   . Depression   . Fibromyalgia 2005  . GERD (gastroesophageal reflux disease)   . Insomnia   . Migraine headache   . Pneumonia     Past Surgical History:  Procedure Laterality Date  . ABDOMINAL HYSTERECTOMY    . CESAREAN SECTION    . CHOLECYSTECTOMY  1988  . open heart surgery  1967  . PATENT DUCTUS ARTERIOUS REPAIR  1967  . SHOULDER ARTHROSCOPY Left 06/16/2017   Procedure: LEFT SHOULDER ARTHROSCOPY, DEBRIDEMENT, AND DECOMPRESSION;  Surgeon: Newt Minion, MD;  Location: [facility];  Service: Orthopedics;  Laterality: Left;  . SHOULDER SURGERY Left 2008   AC joint    There were no vitals filed for this visit.  Subjective Assessment - 02/21/18 1706    Subjective   S: It's been so much better, I can actually move my arm now.     Currently in Pain?  Yes    Pain Score  4     Pain Location  Shoulder    Pain Orientation  Left;Anterior    Pain Descriptors / Indicators  Aching;Sore    Pain Type  Acute pain    Pain Radiating Towards  n/a    Pain Onset  More than a month ago    Pain Frequency  Intermittent    Aggravating Factors   external rotation    Pain Relieving Factors  n/a    Effect of Pain on Daily  Activities  moderate    Multiple Pain Sites  No         [facility] OT Assessment - 02/21/18 1518      Assessment   Medical Diagnosis  Left shoulder pain, LUE paresthesia       Precautions   Precautions  None               OT Treatments/Exercises (OP) - 02/21/18 1519      Exercises   Exercises  Shoulder      Shoulder Exercises: Supine   Protraction  PROM;AROM;10 reps    Horizontal ABduction  PROM;AROM;10 reps    External Rotation  PROM;AAROM;10 reps    Internal Rotation  PROM;AROM;10 reps    Flexion  PROM;AROM;10 reps    ABduction  PROM;AROM;10 reps      Shoulder Exercises: Standing   External Rotation  Theraband;10 reps    Theraband Level (Shoulder External Rotation)  Level 2 (Red)    Internal Rotation  Theraband;10 reps    Theraband Level (Shoulder Internal Rotation)  Level 2 (Red)    Extension  Theraband;10 reps  Theraband Level (Shoulder Extension)  Level 2 (Red)    Row  Theraband;10 reps    Theraband Level (Shoulder Row)  Level 2 (Red)    Retraction  Theraband;10 reps    Theraband Level (Shoulder Retraction)  Level 2 (Red)    Other Standing Exercises  red looped band: wall walks, lateral wall slides, diagonal wall slides, 10X each    Other Standing Exercises  stargazer stretch: 2X, 10"      Manual Therapy   Manual Therapy  Myofascial release    Manual therapy comments  manual therapy completed seperately from all other interventions this date    Myofascial Release  myofascial release and manual stretching to left upper arm, anterior and posterior shoulder and associated areas to decrease restrictions and improve pain free mobilty               OT Short Term Goals - 02/19/18 1645      OT SHORT TERM GOAL #1   Title  Pt will be educated on and independent in HEP to improve functional use of LUE during daily tasks.     Time  4    Period  Weeks    Status  On-going      OT SHORT TERM GOAL #2   Title  Pt will improve LUE P/ROM to WNL to increase  ability to use LUE as assist during dressing tasks.     Time  4    Period  Weeks    Status  On-going      OT SHORT TERM GOAL #3   Title  Pt will decrease pain in LUE to 5/10 to improve ability to sleep at night.     Time  4    Period  Weeks    Status  On-going      OT SHORT TERM GOAL #4   Title  Pt will improve LUE strength to 4-/5 to increase ability to use LUE as assist when driving.     Time  4    Period  Weeks    Status  On-going      OT SHORT TERM GOAL #5   Status  On-going        OT Long Term Goals - 02/19/18 1645      OT LONG TERM GOAL #1   Title  Pt will return to highest level of functioning during B/IADL tasks using LUE as non-dominant.     Time  8    Period  Weeks    Status  On-going      OT LONG TERM GOAL #2   Title  Pt will decrease LUE pain to 3/10 or less to improve ability to use LUE during grooming tasks such as washing hair.     Time  8    Period  Weeks    Status  On-going      OT LONG TERM GOAL #3   Title  Pt will decrease fascial restrictions in LUE from mod/max to minimal amounts to improve mobility required for functional reaching tasks.     Time  8    Period  Weeks    Status  On-going      OT LONG TERM GOAL #4   Title  Pt will improve LUE A/ROM to [facility] to increase ability to perform overhead reaching tasks at home and work.     Time  8    Period  Weeks    Status  On-going  OT LONG TERM GOAL #5   Title  Pt will improve LUE strength to 5/5 to increase ability to pull/push heavy cart at work.     Time  8    Period  Weeks    Status  On-going            Plan - 02/21/18 1708    Clinical Impression Statement  A: Pt reporting she only has pain with external rotation and some abduction. Pt does have popping with flexion and abduction, added wall slides with band for shoulder stabilization. Continued with A/ROM and scapular theraband, noted improvement in er by end of session. Verbal cuing for form and technique.      Plan  P: Continue  working on scapular/shoulder stabilization and improving er/IR    Consulted and Agree with Plan of Care  Patient       Patient will benefit from skilled therapeutic intervention in order to improve the following deficits and impairments:  Decreased skin integrity, Increased muscle spasms, Pain, Impaired perceived functional ability, Decreased range of motion, Decreased strength, Impaired UE functional use  Visit Diagnosis: Chronic left shoulder pain  Stiffness of left shoulder, not elsewhere classified  Other symptoms and signs involving the musculoskeletal system    Problem List Patient Active Problem List   Diagnosis Date Noted  . Impingement syndrome of left shoulder   . 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 Gastroesophageal reflux disease without esophagitis 12/26/2016  . Grief at loss of child 11/09/2016  . Sternal fracture 10/06/2015  . Contusion of leg 10/06/2015  . Migraine headache 07/24/2013  . Insomnia 05/30/2013  . Fibromyalgia 05/30/2013   Guadelupe Sabin, OTR/L  [phone] 02/21/2018, 5:10 PM  [facility] [address] [place], [place], [address] Phone: [phone]   Fax:  [phone]  Name: DETRA BORES MRN: [id number] Date of Birth: 1966-09-29

## 2018-02-26 ENCOUNTER — Encounter (HOSPITAL_COMMUNITY): Payer: Self-pay

## 2018-02-26 ENCOUNTER — Ambulatory Visit (HOSPITAL_COMMUNITY): Payer: 59

## 2018-02-26 ENCOUNTER — Other Ambulatory Visit: Payer: Self-pay

## 2018-02-26 DIAGNOSIS — M25512 Pain in left shoulder: Secondary | ICD-10-CM | POA: Diagnosis not present

## 2018-02-26 DIAGNOSIS — M25612 Stiffness of left shoulder, not elsewhere classified: Secondary | ICD-10-CM | POA: Diagnosis not present

## 2018-02-26 DIAGNOSIS — R29898 Other symptoms and signs involving the musculoskeletal system: Secondary | ICD-10-CM

## 2018-02-26 DIAGNOSIS — G8929 Other chronic pain: Secondary | ICD-10-CM | POA: Diagnosis not present

## 2018-02-26 NOTE — Therapy (Signed)
[facility], [place], [address] Phone: [phone]   Fax:  [phone]  Occupational Therapy Treatment  Patient Details  Name: Nichole Brown MRN: [id number] Date of Birth: 1966/07/13 Referring Provider: Jenetta Loges PA   Encounter Date: 02/26/2018  OT End of Session - 02/26/18 1623    Visit Number  4    Number of Visits  16    Date for OT Re-Evaluation  04/09/18 mini reassess 3/30    Authorization Type  [facility]R - no visit limit    OT Start Time  1520    OT Stop Time  1603    OT Time Calculation (min)  43 min    Activity Tolerance  Patient tolerated treatment well    Behavior During Therapy  [facility] for tasks assessed/performed       Past Medical History:  Diagnosis Date  . Anxiety   . Bronchitis   . Depression   . Fibromyalgia 2005  . GERD (gastroesophageal reflux disease)   . Insomnia   . Migraine headache   . Pneumonia     Past Surgical History:  Procedure Laterality Date  . ABDOMINAL HYSTERECTOMY    . CESAREAN SECTION    . CHOLECYSTECTOMY  1988  . open heart surgery  1967  . PATENT DUCTUS ARTERIOUS REPAIR  1967  . SHOULDER ARTHROSCOPY Left 06/16/2017   Procedure: LEFT SHOULDER ARTHROSCOPY, DEBRIDEMENT, AND DECOMPRESSION;  Surgeon: Newt Minion, MD;  Location: [facility];  Service: Orthopedics;  Laterality: Left;  . SHOULDER SURGERY Left 2008   AC joint    There were no vitals filed for this visit.  Subjective Assessment - 02/26/18 1554    Subjective   S: I still have pain. I'm seeing the doctor on Wednesday.    Currently in Pain?  Yes    Pain Score  6     Pain Location  Shoulder    Pain Orientation  Anterior;Left    Pain Descriptors / Indicators  Aching;Sore    Pain Type  Acute pain         [facility] OT Assessment - 02/26/18 1523      Assessment   Medical Diagnosis  Left shoulder pain, LUE paresthesia       Precautions   Precautions  None      AROM   Overall AROM Comments  assessed in  seated, external and internal rotation with shoulder adducted    AROM Assessment Site  Shoulder    Right/Left Shoulder  Left    Left Shoulder Flexion  145 Degrees previous: 145    Left Shoulder ABduction  136 Degrees previous: 95    Left Shoulder Internal Rotation  90 Degrees previous: 80    Left Shoulder External Rotation  65 Degrees previous: 40      PROM   Overall PROM Comments  assessed in supine, flexion and abuction are WNL, external rotation and internal rotation are 75%       Strength   Overall Strength Comments  assessed in seated external and internal rotation with shoulder adducted     Right/Left Shoulder  Left    Left Shoulder Flexion  4+/5 preivous; 4-/5    Left Shoulder ABduction  4+/5 previous: 3+/5    Left Shoulder Internal Rotation  5/5 previous: 4+/5    Left Shoulder External Rotation  4+/5 previuos: 4+/5               OT Treatments/Exercises (OP) - 02/26/18 1523  Exercises   Exercises  Shoulder      Shoulder Exercises: Supine   Protraction  PROM;5 reps    Horizontal ABduction  PROM;5 reps    External Rotation  PROM;5 reps    Internal Rotation  PROM;5 reps    Flexion  PROM;5 reps    ABduction  PROM;5 reps      Shoulder Exercises: ROM/Strengthening   Over Head Lace  2'     Ball on Wall  1' flexion 1'abduction green ball      Manual Therapy   Manual Therapy  Myofascial release;Joint mobilization    Manual therapy comments  manual therapy completed seperately from all other interventions this date    Joint Mobilization  Joint mobilization and stretch completed to left upper arm to increase ROM and mobilize soft tissue and joint.     Myofascial Release  myofascial release and manual stretching to left upper arm, anterior and posterior shoulder and associated areas to decrease restrictions and improve pain free mobilty             OT Education - 02/26/18 1623    Education provided  Yes    Education Details  Discussed adding External  rotation stretch against wall to HEP and discontinue cross body stretch.     Person(s) Educated  Patient    Methods  Explanation    Comprehension  Verbalized understanding       OT Short Term Goals - 02/19/18 1645      OT SHORT TERM GOAL #1   Title  Pt will be educated on and independent in HEP to improve functional use of LUE during daily tasks.     Time  4    Period  Weeks    Status  On-going      OT SHORT TERM GOAL #2   Title  Pt will improve LUE P/ROM to WNL to increase ability to use LUE as assist during dressing tasks.     Time  4    Period  Weeks    Status  On-going      OT SHORT TERM GOAL #3   Title  Pt will decrease pain in LUE to 5/10 to improve ability to sleep at night.     Time  4    Period  Weeks    Status  On-going      OT SHORT TERM GOAL #4   Title  Pt will improve LUE strength to 4-/5 to increase ability to use LUE as assist when driving.     Time  4    Period  Weeks    Status  On-going      OT SHORT TERM GOAL #5   Status  On-going        OT Long Term Goals - 02/19/18 1645      OT LONG TERM GOAL #1   Title  Pt will return to highest level of functioning during B/IADL tasks using LUE as non-dominant.     Time  8    Period  Weeks    Status  On-going      OT LONG TERM GOAL #2   Title  Pt will decrease LUE pain to 3/10 or less to improve ability to use LUE during grooming tasks such as washing hair.     Time  8    Period  Weeks    Status  On-going      OT LONG TERM GOAL #3   Title  Pt will  decrease fascial restrictions in LUE from mod/max to minimal amounts to improve mobility required for functional reaching tasks.     Time  8    Period  Weeks    Status  On-going      OT LONG TERM GOAL #4   Title  Pt will improve LUE A/ROM to [facility] to increase ability to perform overhead reaching tasks at home and work.     Time  8    Period  Weeks    Status  On-going      OT LONG TERM GOAL #5   Title  Pt will improve LUE strength to 5/5 to increase  ability to pull/push heavy cart at work.     Time  8    Period  Weeks    Status  On-going            Plan - 02/26/18 1624    Clinical Impression Statement  A: Measurements taken for MD appointment. In just 3 weeks, patient has made a significant amount of progress with her ROM and strength in her LUE. Pain continues to be a deficit for patient as she experiences pain with all activities. Add overhead lacing and ball on the wall to focus on scapular strengthening and stability. VC for form and technique.     Plan  P: Follow up on MD appointment. Complete prone hughston exercises, and sidelying and prone strengthening for scapular stability.        Patient will benefit from skilled therapeutic intervention in order to improve the following deficits and impairments:  Decreased skin integrity, Increased muscle spasms, Pain, Impaired perceived functional ability, Decreased range of motion, Decreased strength, Impaired UE functional use  Visit Diagnosis: Chronic left shoulder pain  Stiffness of left shoulder, not elsewhere classified  Other symptoms and signs involving the musculoskeletal system    Problem List Patient Active Problem List   Diagnosis Date Noted  . Impingement syndrome of left shoulder   . 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 Gastroesophageal reflux disease without esophagitis 12/26/2016  . Grief at loss of child 11/09/2016  . Sternal fracture 10/06/2015  . Contusion of leg 10/06/2015  . Migraine headache 07/24/2013  . Insomnia 05/30/2013  . Fibromyalgia 05/30/2013   Ailene Ravel, OTR/L,CBIS  [phone]  02/26/2018, 4:26 PM  [facility] [address] [place], [place], [address] Phone: [phone]   Fax:  [phone]  Name: Nichole Brown MRN: [id number] Date of Birth: July 12, 1966

## 2018-02-28 ENCOUNTER — Ambulatory Visit (HOSPITAL_COMMUNITY): Payer: 59 | Admitting: Occupational Therapy

## 2018-02-28 DIAGNOSIS — M7502 Adhesive capsulitis of left shoulder: Secondary | ICD-10-CM | POA: Diagnosis not present

## 2018-03-01 ENCOUNTER — Ambulatory Visit (HOSPITAL_COMMUNITY): Payer: 59 | Admitting: Occupational Therapy

## 2018-03-05 DIAGNOSIS — M4003 Postural kyphosis, cervicothoracic region: Secondary | ICD-10-CM | POA: Diagnosis not present

## 2018-03-05 DIAGNOSIS — M436 Torticollis: Secondary | ICD-10-CM | POA: Diagnosis not present

## 2018-03-05 DIAGNOSIS — M9902 Segmental and somatic dysfunction of thoracic region: Secondary | ICD-10-CM | POA: Diagnosis not present

## 2018-03-05 DIAGNOSIS — M9901 Segmental and somatic dysfunction of cervical region: Secondary | ICD-10-CM | POA: Diagnosis not present

## 2018-03-06 ENCOUNTER — Ambulatory Visit (INDEPENDENT_AMBULATORY_CARE_PROVIDER_SITE_OTHER): Payer: 59 | Admitting: Nurse Practitioner

## 2018-03-06 ENCOUNTER — Encounter: Payer: Self-pay | Admitting: Nurse Practitioner

## 2018-03-06 VITALS — BP 122/74 | Ht 62.0 in | Wt 169.4 lb

## 2018-03-06 DIAGNOSIS — Z1231 Encounter for screening mammogram for malignant neoplasm of breast: Secondary | ICD-10-CM

## 2018-03-06 DIAGNOSIS — Z1211 Encounter for screening for malignant neoplasm of colon: Secondary | ICD-10-CM | POA: Diagnosis not present

## 2018-03-06 DIAGNOSIS — Z01419 Encounter for gynecological examination (general) (routine) without abnormal findings: Secondary | ICD-10-CM

## 2018-03-06 NOTE — Progress Notes (Signed)
   Subjective:    Patient ID: Nichole Brown, female    DOB: [birth year], 52 y.o.   MRN: [id number]  HPI presents for her wellness exam. Has had a complete hysterectomy. Same sexual partner. Continues follow up with headache specialist at [facility]. Regular eye exams. Needs dental exam. Needs mammogram. Her mother is in her 35s and was just diagnosed with early breast cancer. Regular exercise.     Review of Systems  Constitutional: Negative for activity change, appetite change and fatigue.  HENT: Negative for dental problem, ear pain, sinus pressure and sore throat.   Respiratory: Negative for cough, chest tightness, shortness of breath and wheezing.   Cardiovascular: Negative for chest pain.  Gastrointestinal: Negative for abdominal distention, abdominal pain, blood in stool, constipation, diarrhea, nausea and vomiting.  Genitourinary: Negative for difficulty urinating, dysuria, enuresis, frequency, genital sores, pelvic pain, urgency and vaginal discharge.  Neurological: Positive for headaches.       Objective:   Physical Exam  Constitutional: She is oriented to person, place, and time. She appears well-developed. No distress.  HENT:  Right Ear: External ear normal.  Left Ear: External ear normal.  Mouth/Throat: Oropharynx is clear and moist.  Neck: Normal range of motion. Neck supple. No tracheal deviation present. No thyromegaly present.  Cardiovascular: Normal rate, regular rhythm and normal heart sounds. Exam reveals no gallop.  No murmur heard. Pulmonary/Chest: Effort normal and breath sounds normal. Right breast exhibits no inverted nipple, no mass, no skin change and no tenderness. Left breast exhibits no inverted nipple, no mass, no skin change and no tenderness. Breasts are symmetrical.  Axillae no adenopathy.   Abdominal: Soft. She exhibits no distension. There is no tenderness.  Genitourinary: Vagina normal. No vaginal discharge found.  Genitourinary Comments: External GU: no  rashes or lesions. Vagina: no discharge. Bimanual exam: no tenderness or masses.   Musculoskeletal: She exhibits no edema.  Lymphadenopathy:    She has no cervical adenopathy.  Neurological: She is alert and oriented to person, place, and time.  Skin: Skin is warm and dry. No rash noted.  Psychiatric: She has a normal mood and affect. Her behavior is normal.  Vitals reviewed.         Assessment & Plan:  Well woman exam - Plan: MM Digital Screening, HIV antibody (with reflex), Hepatitis C Antibody, Basic Metabolic Panel (BMET), Lipid Profile, Hepatic function panel, TSH, Vitamin D (25 hydroxy), CBC with Differential, Ambulatory referral to Gastroenterology  Encounter for screening mammogram for breast cancer - Plan: MM Digital Screening  Screen for colon cancer - Plan: Ambulatory referral to Gastroenterology  Encouraged continued activity. Recommend vitamin D and calcium supplement. Labs pending. Refer for screening colonoscopy.  Return in about 1 year (around 03/07/2019) for physical.

## 2018-03-07 ENCOUNTER — Encounter (HOSPITAL_COMMUNITY): Payer: Self-pay | Admitting: Occupational Therapy

## 2018-03-07 ENCOUNTER — Ambulatory Visit (HOSPITAL_COMMUNITY): Payer: 59 | Admitting: Occupational Therapy

## 2018-03-07 DIAGNOSIS — R29898 Other symptoms and signs involving the musculoskeletal system: Secondary | ICD-10-CM

## 2018-03-07 DIAGNOSIS — M25612 Stiffness of left shoulder, not elsewhere classified: Secondary | ICD-10-CM | POA: Diagnosis not present

## 2018-03-07 DIAGNOSIS — G8929 Other chronic pain: Secondary | ICD-10-CM | POA: Diagnosis not present

## 2018-03-07 DIAGNOSIS — M25512 Pain in left shoulder: Principal | ICD-10-CM

## 2018-03-07 NOTE — Therapy (Signed)
[place], [place], [address] Phone: [phone]   Fax:  [phone]  Occupational Therapy Treatment  Patient Details  Name: Nichole Brown MRN: [id number] Date of Birth: Aug 19, 1966 Referring Provider: Jenetta Loges PA   Encounter Date: 03/07/2018  OT End of Session - 03/07/18 1620    Visit Number  5    Number of Visits  16    Date for OT Re-Evaluation  04/09/18 mini reassess 3/30    Authorization Type  UMR - no visit limit    OT Start Time  1515    OT Stop Time  1558    OT Time Calculation (min)  43 min    Activity Tolerance  Patient tolerated treatment well    Behavior During Therapy  [facility] for tasks assessed/performed       Past Medical History:  Diagnosis Date  . Anxiety   . Bronchitis   . Depression   . Fibromyalgia 2005  . GERD (gastroesophageal reflux disease)   . Insomnia   . Migraine headache   . Pneumonia     Past Surgical History:  Procedure Laterality Date  . ABDOMINAL HYSTERECTOMY    . CESAREAN SECTION    . CHOLECYSTECTOMY  1988  . open heart surgery  1967  . PATENT DUCTUS ARTERIOUS REPAIR  1967  . SHOULDER ARTHROSCOPY Left 06/16/2017   Procedure: LEFT SHOULDER ARTHROSCOPY, DEBRIDEMENT, AND DECOMPRESSION;  Surgeon: Newt Minion, MD;  Location: [place];  Service: Orthopedics;  Laterality: Left;  . SHOULDER SURGERY Left 2008   AC joint    There were no vitals filed for this visit.  Subjective Assessment - 03/07/18 1509    Subjective   S: I'm feeling pretty good today even after working all day.     Currently in Pain?  No/denies         [facility] OT Assessment - 03/07/18 1509      Assessment   Medical Diagnosis  Left shoulder pain, LUE paresthesia       Precautions   Precautions  None               OT Treatments/Exercises (OP) - 03/07/18 1509      Exercises   Exercises  Shoulder      Shoulder Exercises: Supine   Protraction  PROM;5 reps    Horizontal ABduction   PROM;5 reps    External Rotation  PROM;5 reps    Internal Rotation  PROM;5 reps    Flexion  PROM;5 reps    ABduction  PROM;5 reps      Shoulder Exercises: Prone   Flexion  Strengthening;10 reps    Flexion Weight (lbs)  1    Extension  Strengthening;10 reps    Extension Weight (lbs)  1    Horizontal ABduction 1  Strengthening;10 reps    Horizontal ABduction 1 Weight (lbs)  1    Horizontal ABduction 2  Strengthening;10 reps    Horizontal ABduction 2 Weight (lbs)  1    Other Prone Exercises  H1, H3, H4, A/ROM, 10X      Shoulder Exercises: Sidelying   External Rotation  AROM;12 reps    Internal Rotation  AROM;12 reps    Flexion  AROM;12 reps    ABduction  AROM;12 reps    Other Sidelying Exercises  horizontal abduction, A/ROM, 12X    Other Sidelying Exercises  protraction, A/ROM, 12X      Shoulder Exercises: Standing   Extension  Theraband;10  reps    Theraband Level (Shoulder Extension)  Level 3 (Green)    Row  Theraband;10 reps    Theraband Level (Shoulder Row)  Level 3 (Green)    Retraction  Theraband;10 reps    Theraband Level (Shoulder Retraction)  Level 3 (Green)      Shoulder Exercises: ROM/Strengthening   "W" Arms  10X    X to V Arms  12X, 1#    Ball on Wall  1' flexion 1'abduction green ball      Manual Therapy   Manual Therapy  Myofascial release    Manual therapy comments  manual therapy completed seperately from all other interventions this date    Myofascial Release  myofascial release and manual stretching to left upper arm, anterior and posterior shoulder and associated areas to decrease restrictions and improve pain free mobilty               OT Short Term Goals - 02/19/18 1645      OT SHORT TERM GOAL #1   Title  Pt will be educated on and independent in HEP to improve functional use of LUE during daily tasks.     Time  4    Period  Weeks    Status  On-going      OT SHORT TERM GOAL #2   Title  Pt will improve LUE P/ROM to WNL to increase ability  to use LUE as assist during dressing tasks.     Time  4    Period  Weeks    Status  On-going      OT SHORT TERM GOAL #3   Title  Pt will decrease pain in LUE to 5/10 to improve ability to sleep at night.     Time  4    Period  Weeks    Status  On-going      OT SHORT TERM GOAL #4   Title  Pt will improve LUE strength to 4-/5 to increase ability to use LUE as assist when driving.     Time  4    Period  Weeks    Status  On-going      OT SHORT TERM GOAL #5   Status  On-going        OT Long Term Goals - 02/19/18 1645      OT LONG TERM GOAL #1   Title  Pt will return to highest level of functioning during B/IADL tasks using LUE as non-dominant.     Time  8    Period  Weeks    Status  On-going      OT LONG TERM GOAL #2   Title  Pt will decrease LUE pain to 3/10 or less to improve ability to use LUE during grooming tasks such as washing hair.     Time  8    Period  Weeks    Status  On-going      OT LONG TERM GOAL #3   Title  Pt will decrease fascial restrictions in LUE from mod/max to minimal amounts to improve mobility required for functional reaching tasks.     Time  8    Period  Weeks    Status  On-going      OT LONG TERM GOAL #4   Title  Pt will improve LUE A/ROM to [facility] to increase ability to perform overhead reaching tasks at home and work.     Time  8    Period  Weeks  Status  On-going      OT LONG TERM GOAL #5   Title  Pt will improve LUE strength to 5/5 to increase ability to pull/push heavy cart at work.     Time  8    Period  Weeks    Status  On-going            Plan - 03/07/18 1621    Clinical Impression Statement  A: Pt reports MD wants her to continue with therapy. Pt reports she is noticing progress as she is able to go longer at work before feeling discomfort. Added sidelying exercises, prone strengthening, and prone hughston exercises, pt with mod difficulty during hughston exercises due to weakness in mid-trapezius. Verbal cuing for form and  technique during exercises.     Plan  P: continue with prone hughston and add red loop band for scapular strengthening       Patient will benefit from skilled therapeutic intervention in order to improve the following deficits and impairments:  Decreased skin integrity, Increased muscle spasms, Pain, Impaired perceived functional ability, Decreased range of motion, Decreased strength, Impaired UE functional use  Visit Diagnosis: Chronic left shoulder pain  Stiffness of left shoulder, not elsewhere classified  Other symptoms and signs involving the musculoskeletal system    Problem List Patient Active Problem List   Diagnosis Date Noted  . Impingement syndrome of left shoulder   . 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 Gastroesophageal reflux disease without esophagitis 12/26/2016  . Grief at loss of child 11/09/2016  . Sternal fracture 10/06/2015  . Contusion of leg 10/06/2015  . Migraine headache 07/24/2013  . Insomnia 05/30/2013  . Fibromyalgia 05/30/2013   Guadelupe Sabin, OTR/L  [phone] 03/07/2018, 4:23 PM  [facility] [address] [place], [place], [address] Phone: [phone]   Fax:  [phone]  Name: MATHEW STORCK MRN: [id number] Date of Birth: Apr 30, 1966

## 2018-03-08 ENCOUNTER — Encounter: Payer: Self-pay | Admitting: Family Medicine

## 2018-03-08 DIAGNOSIS — M9902 Segmental and somatic dysfunction of thoracic region: Secondary | ICD-10-CM | POA: Diagnosis not present

## 2018-03-08 DIAGNOSIS — M9901 Segmental and somatic dysfunction of cervical region: Secondary | ICD-10-CM | POA: Diagnosis not present

## 2018-03-08 DIAGNOSIS — M4003 Postural kyphosis, cervicothoracic region: Secondary | ICD-10-CM | POA: Diagnosis not present

## 2018-03-08 DIAGNOSIS — M436 Torticollis: Secondary | ICD-10-CM | POA: Diagnosis not present

## 2018-03-09 ENCOUNTER — Other Ambulatory Visit: Payer: Self-pay | Admitting: Nurse Practitioner

## 2018-03-09 ENCOUNTER — Encounter (HOSPITAL_COMMUNITY): Payer: Self-pay | Admitting: Occupational Therapy

## 2018-03-09 ENCOUNTER — Telehealth: Payer: Self-pay | Admitting: Family Medicine

## 2018-03-09 ENCOUNTER — Telehealth (HOSPITAL_COMMUNITY): Payer: Self-pay | Admitting: Family Medicine

## 2018-03-09 MED ORDER — AMOXICILLIN-POT CLAVULANATE 875-125 MG PO TABS: 1.0000 | ORAL_TABLET | Freq: Two times a day (BID) | ORAL | 0 refills | Status: DC

## 2018-03-09 NOTE — Telephone Encounter (Signed)
Pt called to check on this. Please advise.

## 2018-03-09 NOTE — Telephone Encounter (Signed)
Please advise 

## 2018-03-09 NOTE — Telephone Encounter (Signed)
Message for Nichole Brown-Patient was seen 3/26 for physical but now sick with fever,sorethroat,drainage. She states was told to call back if she got worse. [place]

## 2018-03-09 NOTE — Telephone Encounter (Signed)
Patient notified and verbalized understanding. 

## 2018-03-09 NOTE — Telephone Encounter (Signed)
Sent in antibiotic to [facility]. Call back next week if no better.

## 2018-03-09 NOTE — Telephone Encounter (Signed)
03/09/18  has a fever and bad sore throat, waiting for the dr to call her back

## 2018-03-12 ENCOUNTER — Ambulatory Visit (HOSPITAL_COMMUNITY): Payer: Self-pay

## 2018-03-14 ENCOUNTER — Encounter: Payer: Self-pay | Admitting: Family Medicine

## 2018-03-14 ENCOUNTER — Telehealth: Payer: Self-pay | Admitting: Nurse Practitioner

## 2018-03-14 ENCOUNTER — Ambulatory Visit: Payer: 59 | Admitting: Family Medicine

## 2018-03-14 ENCOUNTER — Other Ambulatory Visit: Payer: Self-pay | Admitting: Nurse Practitioner

## 2018-03-14 ENCOUNTER — Ambulatory Visit (HOSPITAL_COMMUNITY): Payer: 59

## 2018-03-14 ENCOUNTER — Telehealth (HOSPITAL_COMMUNITY): Payer: Self-pay | Admitting: Family Medicine

## 2018-03-14 ENCOUNTER — Encounter (HOSPITAL_COMMUNITY): Payer: Self-pay

## 2018-03-14 VITALS — BP 100/80 | Temp 98.8°F | Ht 62.0 in | Wt 169.0 lb

## 2018-03-14 DIAGNOSIS — J0100 Acute maxillary sinusitis, unspecified: Secondary | ICD-10-CM | POA: Diagnosis not present

## 2018-03-14 MED ORDER — AZITHROMYCIN 250 MG PO TABS
ORAL_TABLET | ORAL | 0 refills | Status: DC
Start: 2018-03-14 — End: 2018-04-12

## 2018-03-14 NOTE — Telephone Encounter (Signed)
Concerned because she is on a very strong antibiotic. What is her work schedule? Could she come in tomorrow? Does Nichole Brown still have anything later?

## 2018-03-14 NOTE — Telephone Encounter (Signed)
Actually Nichole Brown called in Augmentin. Has she seen any improvement? Color to drainage? Fever? What specifically are her symptoms that she needs treatment for? Thanks.

## 2018-03-14 NOTE — Telephone Encounter (Signed)
03/14/18  Pt is sick and just came from drs office and is currently on antibiotics

## 2018-03-14 NOTE — Telephone Encounter (Signed)
Patient was prescribed amoxicillin by Hoyle Sauer on 03/09/18.  She said it doesn't seem to be helping.  She has cough and a lot of congestion.  She is requesting something else to be called in.   [place]

## 2018-03-14 NOTE — Telephone Encounter (Signed)
Discussed with pt. Offered pt today. Pt transferred to the front to schedule for today.

## 2018-03-14 NOTE — Progress Notes (Signed)
   Subjective:    Patient ID: Nichole Brown, female    DOB: [birth year], 52 y.o.   MRN: [id number]  HPI  Patient is here today with complaints of a cough and congestion,headache,.sore throat,fever,nose stopped up,body aches,right ear pain for a week. Called last week and Hoyle Sauer called in Amoxicillin, but still having symptoms. Patient with significant congestion drainage coughing denies high fever chills wheezing difficulty breathing nausea vomiting diarrhea PMH benign Review of Systems  Constitutional: Positive for fatigue and fever. Negative for activity change and chills.  HENT: Positive for congestion and rhinorrhea. Negative for ear pain.   Eyes: Negative for discharge.  Respiratory: Positive for cough. Negative for shortness of breath and wheezing.   Cardiovascular: Negative for chest pain.       Objective:   Physical Exam  Constitutional: She appears well-developed.  HENT:  Head: Normocephalic.  Right Ear: External ear normal.  Left Ear: External ear normal.  Nose: Nose normal.  Mouth/Throat: Oropharynx is clear and moist. No oropharyngeal exudate.  Eyes: Right eye exhibits no discharge. Left eye exhibits no discharge.  Neck: Neck supple. No tracheal deviation present.  Cardiovascular: Normal rate and normal heart sounds.  No murmur heard. Pulmonary/Chest: Effort normal and breath sounds normal. She has no wheezes. She has no rales.  Lymphadenopathy:    She has no cervical adenopathy.  Skin: Skin is warm and dry.  Nursing note and vitals reviewed.         Assessment & Plan:  Patient was seen today for upper respiratory illness. It is felt that the patient is dealing with sinusitis. Antibiotics were prescribed today. Importance of compliance with medication was discussed. Symptoms should gradually resolve over the course of the next several days. If high fevers, progressive illness, difficulty breathing, worsening condition or failure for symptoms to improve over the  next several days then the patient is to follow-up. If any emergent conditions the patient is to follow-up in the emergency department otherwise to follow-up in the office. Sinusitis

## 2018-03-14 NOTE — Telephone Encounter (Signed)
Pt states congestion is thick and drainage is yellow. She is not having any fever, she is having headache, body aches and no energy.

## 2018-03-14 NOTE — Telephone Encounter (Signed)
Left msg to return call

## 2018-03-15 ENCOUNTER — Telehealth (HOSPITAL_COMMUNITY): Payer: Self-pay | Admitting: Family Medicine

## 2018-03-15 NOTE — Telephone Encounter (Signed)
03/15/18  pt called and still not any better

## 2018-03-16 ENCOUNTER — Ambulatory Visit (HOSPITAL_COMMUNITY): Payer: 59 | Admitting: Occupational Therapy

## 2018-03-21 ENCOUNTER — Encounter (HOSPITAL_COMMUNITY): Payer: Self-pay

## 2018-03-21 ENCOUNTER — Telehealth (HOSPITAL_COMMUNITY): Payer: Self-pay | Admitting: Occupational Therapy

## 2018-03-21 NOTE — Telephone Encounter (Signed)
Patient called to cancel she is not feeling well today

## 2018-03-23 ENCOUNTER — Encounter (HOSPITAL_COMMUNITY): Payer: Self-pay | Admitting: Occupational Therapy

## 2018-03-23 ENCOUNTER — Encounter: Payer: Self-pay | Admitting: Gastroenterology

## 2018-03-23 ENCOUNTER — Ambulatory Visit (HOSPITAL_COMMUNITY): Payer: 59 | Attending: Orthopedic Surgery | Admitting: Occupational Therapy

## 2018-03-23 DIAGNOSIS — M25612 Stiffness of left shoulder, not elsewhere classified: Secondary | ICD-10-CM | POA: Diagnosis not present

## 2018-03-23 DIAGNOSIS — G8929 Other chronic pain: Secondary | ICD-10-CM

## 2018-03-23 DIAGNOSIS — R29898 Other symptoms and signs involving the musculoskeletal system: Secondary | ICD-10-CM

## 2018-03-23 DIAGNOSIS — G43709 Chronic migraine without aura, not intractable, without status migrainosus: Secondary | ICD-10-CM | POA: Diagnosis not present

## 2018-03-23 DIAGNOSIS — M25512 Pain in left shoulder: Secondary | ICD-10-CM | POA: Diagnosis not present

## 2018-03-23 NOTE — Therapy (Signed)
[place], [place], [address] Phone: [phone]   Fax:  [phone]  Occupational Therapy Treatment  Patient Details  Name: Nichole Brown MRN: [id number] Date of Birth: 06-25-1966 Referring Provider: Jenetta Loges PA   Encounter Date: 03/23/2018  OT End of Session - 03/23/18 1736    Visit Number  6    Number of Visits  16    Date for OT Re-Evaluation  04/09/18 mini reassess 3/30    Authorization Type  UMR - no visit limit    OT Start Time  1650    OT Stop Time  1731    OT Time Calculation (min)  41 min    Activity Tolerance  Patient tolerated treatment well    Behavior During Therapy  [facility] for tasks assessed/performed       Past Medical History:  Diagnosis Date  . Anxiety   . Bronchitis   . Depression   . Fibromyalgia 2005  . GERD (gastroesophageal reflux disease)   . Insomnia   . Migraine headache   . Pneumonia     Past Surgical History:  Procedure Laterality Date  . ABDOMINAL HYSTERECTOMY    . CESAREAN SECTION    . CHOLECYSTECTOMY  1988  . open heart surgery  1967  . PATENT DUCTUS ARTERIOUS REPAIR  1967  . SHOULDER ARTHROSCOPY Left 06/16/2017   Procedure: LEFT SHOULDER ARTHROSCOPY, DEBRIDEMENT, AND DECOMPRESSION;  Surgeon: Newt Minion, MD;  Location: [facility];  Service: Orthopedics;  Laterality: Left;  . SHOULDER SURGERY Left 2008   AC joint    There were no vitals filed for this visit.  Subjective Assessment - 03/23/18 1652    Subjective   S: I can actually lean on it without it hurting.     Currently in Pain?  No/denies         [facility] OT Assessment - 03/23/18 1650      Assessment   Medical Diagnosis  Left shoulder pain, LUE paresthesia       Precautions   Precautions  None               OT Treatments/Exercises (OP) - 03/23/18 1653      Exercises   Exercises  Shoulder;Elbow      Shoulder Exercises: Supine   Protraction  PROM;5 reps    Horizontal ABduction  PROM;5  reps    External Rotation  PROM;5 reps    Internal Rotation  PROM;5 reps    Flexion  PROM;5 reps    ABduction  PROM;5 reps      Shoulder Exercises: Prone   Flexion  Strengthening;10 reps    Flexion Weight (lbs)  1    Extension  Strengthening;10 reps    Extension Weight (lbs)  1    Horizontal ABduction 1  Strengthening;10 reps    Horizontal ABduction 1 Weight (lbs)  1    Horizontal ABduction 2  Strengthening;10 reps    Horizontal ABduction 2 Weight (lbs)  1    Other Prone Exercises  H1, H3, H4, A/ROM, 10X      Shoulder Exercises: Standing   Protraction  Theraband;10 reps    Theraband Level (Shoulder Protraction)  Level 2 (Red)    Horizontal ABduction  Theraband;10 reps    Theraband Level (Shoulder Horizontal ABduction)  Level 2 (Red)    External Rotation  Theraband;10 reps    Theraband Level (Shoulder External Rotation)  Level 2 (Red)    Internal Rotation  Theraband;10  reps    Theraband Level (Shoulder Internal Rotation)  Level 2 (Red)    Flexion  Theraband;10 reps    Theraband Level (Shoulder Flexion)  Level 2 (Red)    ABduction  Theraband;10 reps    Theraband Level (Shoulder ABduction)  Level 2 (Red)    Extension  Theraband;10 reps    Theraband Level (Shoulder Extension)  Level 3 (Green)    Row  [employer] reps    Theraband Level (Shoulder Row)  Level 3 (Green)    Retraction  Theraband;10 reps    Theraband Level (Shoulder Retraction)  Level 3 (Green)    Other Standing Exercises  red looped band: lateral wall slides, diagonal wall slides, 10X each      Shoulder Exercises: ROM/Strengthening   Cybex Press  2 plate;10 reps    Cybex Row  2 plate;10 reps    "W" Arms  10X, 1#    X to V Arms  12X, 1#      Elbow Exercises   Theraband Level (Elbow Flexion)  Level 2 (Red) bicep curl and hammer curl, 10X each      Manual Therapy   Manual Therapy  Myofascial release    Manual therapy comments  manual therapy completed seperately from all other interventions this date     Myofascial Release  myofascial release and manual stretching to left upper arm, anterior and posterior shoulder and associated areas to decrease restrictions and improve pain free mobilty               OT Short Term Goals - 02/19/18 1645      OT SHORT TERM GOAL #1   Title  Pt will be educated on and independent in HEP to improve functional use of LUE during daily tasks.     Time  4    Period  Weeks    Status  On-going      OT SHORT TERM GOAL #2   Title  Pt will improve LUE P/ROM to WNL to increase ability to use LUE as assist during dressing tasks.     Time  4    Period  Weeks    Status  On-going      OT SHORT TERM GOAL #3   Title  Pt will decrease pain in LUE to 5/10 to improve ability to sleep at night.     Time  4    Period  Weeks    Status  On-going      OT SHORT TERM GOAL #4   Title  Pt will improve LUE strength to 4-/5 to increase ability to use LUE as assist when driving.     Time  4    Period  Weeks    Status  On-going      OT SHORT TERM GOAL #5   Status  On-going        OT Long Term Goals - 02/19/18 1645      OT LONG TERM GOAL #1   Title  Pt will return to highest level of functioning during B/IADL tasks using LUE as non-dominant.     Time  8    Period  Weeks    Status  On-going      OT LONG TERM GOAL #2   Title  Pt will decrease LUE pain to 3/10 or less to improve ability to use LUE during grooming tasks such as washing hair.     Time  8    Period  Weeks  Status  On-going      OT LONG TERM GOAL #3   Title  Pt will decrease fascial restrictions in LUE from mod/max to minimal amounts to improve mobility required for functional reaching tasks.     Time  8    Period  Weeks    Status  On-going      OT LONG TERM GOAL #4   Title  Pt will improve LUE A/ROM to [facility] to increase ability to perform overhead reaching tasks at home and work.     Time  8    Period  Weeks    Status  On-going      OT LONG TERM GOAL #5   Title  Pt will improve LUE  strength to 5/5 to increase ability to pull/push heavy cart at work.     Time  8    Period  Weeks    Status  On-going            Plan - 03/23/18 1736    Clinical Impression Statement  A: Pt reports she has been completing her HEP at home and has noticed a big difference in function and pain. Pt is able to perform work tasks with minimal discomfort. Continued with shoulder and scapular strengthening, targeting mid trapezius as pt with continued deficits in this area. Pt with concerns regarding elbow, added elbow flexion strengthening, notable weakness with bicep curl compared to hammer curl. Verbal cuing for form, technique, and speed during all exercises.     Plan  P: Add straight arm pronation/supination with red band, continue working on middle trapezius strength    Consulted and Agree with Plan of Care  Patient       Patient will benefit from skilled therapeutic intervention in order to improve the following deficits and impairments:  Decreased skin integrity, Increased muscle spasms, Pain, Impaired perceived functional ability, Decreased range of motion, Decreased strength, Impaired UE functional use  Visit Diagnosis: Chronic left shoulder pain  Stiffness of left shoulder, not elsewhere classified  Other symptoms and signs involving the musculoskeletal system    Problem List Patient Active Problem List   Diagnosis Date Noted  . Impingement syndrome of left shoulder   . 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 Gastroesophageal reflux disease without esophagitis 12/26/2016  . Grief at loss of child 11/09/2016  . Sternal fracture 10/06/2015  . Contusion of leg 10/06/2015  . Migraine headache 07/24/2013  . Insomnia 05/30/2013  . Fibromyalgia 05/30/2013   Guadelupe Sabin, OTR/L  [phone] 03/23/2018, 5:39 PM  [facility] [address] [place], [place], [address] Phone: [phone]   Fax:  [phone]  Name: KYONNA FRIER MRN: [id number] Date of Birth: 10-Oct-1966

## 2018-03-26 ENCOUNTER — Ambulatory Visit (HOSPITAL_COMMUNITY): Payer: Self-pay

## 2018-03-26 DIAGNOSIS — D485 Neoplasm of uncertain behavior of skin: Secondary | ICD-10-CM | POA: Diagnosis not present

## 2018-03-26 DIAGNOSIS — D2261 Melanocytic nevi of right upper limb, including shoulder: Secondary | ICD-10-CM | POA: Diagnosis not present

## 2018-03-26 DIAGNOSIS — L819 Disorder of pigmentation, unspecified: Secondary | ICD-10-CM | POA: Diagnosis not present

## 2018-03-26 DIAGNOSIS — L219 Seborrheic dermatitis, unspecified: Secondary | ICD-10-CM | POA: Diagnosis not present

## 2018-03-26 DIAGNOSIS — L570 Actinic keratosis: Secondary | ICD-10-CM | POA: Diagnosis not present

## 2018-03-28 ENCOUNTER — Ambulatory Visit (HOSPITAL_COMMUNITY): Payer: 59

## 2018-03-29 ENCOUNTER — Ambulatory Visit (HOSPITAL_COMMUNITY): Payer: 59 | Admitting: Occupational Therapy

## 2018-03-30 ENCOUNTER — Ambulatory Visit (HOSPITAL_COMMUNITY): Payer: 59 | Admitting: Occupational Therapy

## 2018-03-30 ENCOUNTER — Telehealth (HOSPITAL_COMMUNITY): Payer: Self-pay | Admitting: Family Medicine

## 2018-03-30 NOTE — Telephone Encounter (Signed)
03/30/18  pt left a message to [facility] said she had a migraine

## 2018-04-02 DIAGNOSIS — M7502 Adhesive capsulitis of left shoulder: Secondary | ICD-10-CM | POA: Diagnosis not present

## 2018-04-03 ENCOUNTER — Encounter (HOSPITAL_COMMUNITY): Payer: Self-pay | Admitting: Occupational Therapy

## 2018-04-03 ENCOUNTER — Ambulatory Visit (HOSPITAL_COMMUNITY): Payer: 59 | Admitting: Occupational Therapy

## 2018-04-03 DIAGNOSIS — G8929 Other chronic pain: Secondary | ICD-10-CM | POA: Diagnosis not present

## 2018-04-03 DIAGNOSIS — R29898 Other symptoms and signs involving the musculoskeletal system: Secondary | ICD-10-CM | POA: Diagnosis not present

## 2018-04-03 DIAGNOSIS — M25612 Stiffness of left shoulder, not elsewhere classified: Secondary | ICD-10-CM

## 2018-04-03 DIAGNOSIS — M25512 Pain in left shoulder: Principal | ICD-10-CM

## 2018-04-03 NOTE — Therapy (Signed)
[place], [place], [address] Phone: [phone]   Fax:  [phone]  Occupational Therapy Treatment  Patient Details  Name: CALIYAH SIEH MRN: [id number] Date of Birth: [birth year] Referring Provider: Jenetta Loges PA   Encounter Date: 04/03/2018  OT End of Session - 04/03/18 1650    Visit Number  7    Number of Visits  16    Date for OT Re-Evaluation  04/09/18 mini reassess 3/30    Authorization Type  [facility] - no visit limit    OT Start Time  1515    OT Stop Time  1559    OT Time Calculation (min)  44 min    Activity Tolerance  Patient tolerated treatment well    Behavior During Therapy  [facility] for tasks assessed/performed       Past Medical History:  Diagnosis Date  . Anxiety   . Bronchitis   . Depression   . Fibromyalgia 2005  . GERD (gastroesophageal reflux disease)   . Insomnia   . Migraine headache   . Pneumonia     Past Surgical History:  Procedure Laterality Date  . ABDOMINAL HYSTERECTOMY    . CESAREAN SECTION    . CHOLECYSTECTOMY  1988  . open heart surgery  1967  . PATENT DUCTUS ARTERIOUS REPAIR  1967  . SHOULDER ARTHROSCOPY Left 06/16/2017   Procedure: LEFT SHOULDER ARTHROSCOPY, DEBRIDEMENT, AND DECOMPRESSION;  Surgeon: Newt Minion, MD;  Location: [place];  Service: Orthopedics;  Laterality: Left;  . SHOULDER SURGERY Left 2008   AC joint    There were no vitals filed for this visit.  Subjective Assessment - 04/03/18 1516    Subjective   S: The doctor said my elbow issue is probably coming from the shoulder.     Currently in Pain?  No/denies         [facility] OT Assessment - 04/03/18 1516      Assessment   Medical Diagnosis  Left shoulder pain, LUE paresthesia       Precautions   Precautions  None               OT Treatments/Exercises (OP) - 04/03/18 1518      Exercises   Exercises  Shoulder;Elbow      Shoulder Exercises: Supine   Protraction  PROM;5 reps    Horizontal ABduction  PROM;5 reps    External Rotation  PROM;5 reps    Internal Rotation  PROM;5 reps    Flexion  PROM;5 reps    ABduction  PROM;5 reps      Shoulder Exercises: Prone   Flexion  Strengthening;10 reps    Flexion Weight (lbs)  1    Extension  Strengthening;10 reps    Extension Weight (lbs)  1    Horizontal ABduction 1  Strengthening;10 reps    Horizontal ABduction 1 Weight (lbs)  1    Horizontal ABduction 2  Strengthening;10 reps    Horizontal ABduction 2 Weight (lbs)  1    Other Prone Exercises  H1, H3, H4, A/ROM, 10X      Shoulder Exercises: Standing   External Rotation  Theraband;10 reps    Theraband Level (Shoulder External Rotation)  Level 2 (Red)    Internal Rotation  Theraband;10 reps    Theraband Level (Shoulder Internal Rotation)  Level 2 (Red)    Other Standing Exercises  long arm rotation, red band supination and pronation, 10X each      Shoulder  Exercises: ROM/Strengthening   Cybex Press  2.5 plate;10 reps    Cybex Row  2.5 plate;10 reps    Ball on Wall  1' flexion 1' abduction green ball      Shoulder Exercises: Stretch   Corner Stretch  3 reps;30 seconds    Cross Chest Stretch  3 reps;30 seconds    Wall Stretch - Flexion  3 reps;30 seconds      Manual Therapy   Manual Therapy  Myofascial release    Manual therapy comments  manual therapy completed seperately from all other interventions this date    Myofascial Release  myofascial release and manual stretching to left upper arm, anterior and posterior shoulder and associated areas to decrease restrictions and improve pain free mobilty               OT Short Term Goals - 02/19/18 1645      OT SHORT TERM GOAL #1   Title  Pt will be educated on and independent in HEP to improve functional use of LUE during daily tasks.     Time  4    Period  Weeks    Status  On-going      OT SHORT TERM GOAL #2   Title  Pt will improve LUE P/ROM to WNL to increase ability to use LUE as assist during  dressing tasks.     Time  4    Period  Weeks    Status  On-going      OT SHORT TERM GOAL #3   Title  Pt will decrease pain in LUE to 5/10 to improve ability to sleep at night.     Time  4    Period  Weeks    Status  On-going      OT SHORT TERM GOAL #4   Title  Pt will improve LUE strength to 4-/5 to increase ability to use LUE as assist when driving.     Time  4    Period  Weeks    Status  On-going      OT SHORT TERM GOAL #5   Status  On-going        OT Long Term Goals - 02/19/18 1645      OT LONG TERM GOAL #1   Title  Pt will return to highest level of functioning during B/IADL tasks using LUE as non-dominant.     Time  8    Period  Weeks    Status  On-going      OT LONG TERM GOAL #2   Title  Pt will decrease LUE pain to 3/10 or less to improve ability to use LUE during grooming tasks such as washing hair.     Time  8    Period  Weeks    Status  On-going      OT LONG TERM GOAL #3   Title  Pt will decrease fascial restrictions in LUE from mod/max to minimal amounts to improve mobility required for functional reaching tasks.     Time  8    Period  Weeks    Status  On-going      OT LONG TERM GOAL #4   Title  Pt will improve LUE A/ROM to [facility] to increase ability to perform overhead reaching tasks at home and work.     Time  8    Period  Weeks    Status  On-going      OT LONG TERM GOAL #5  Title  Pt will improve LUE strength to 5/5 to increase ability to pull/push heavy cart at work.     Time  8    Period  Weeks    Status  On-going            Plan - 04/03/18 1651    Clinical Impression Statement  A: Pt reports MD believes she is on the right track and wrote additonal prescription for 3-4 additonal weeks of OT. Continued with manual therapy today working on muscle knots palpated at upper trapezius and along medial border of scapula to decrease tightness and improve ROM. Resumed sustained stretching today, added long arm rotation with red band, supination more  difficult than pronation. Attempted prone hughston exercises to target middle trapezius with weights, pt unable to complete due to weakness. Verbal cuing throughout session for form and technique.     Plan  P: Reassessment/recertification, FOTO, continue with strengthening       Patient will benefit from skilled therapeutic intervention in order to improve the following deficits and impairments:  Decreased skin integrity, Increased muscle spasms, Pain, Impaired perceived functional ability, Decreased range of motion, Decreased strength, Impaired UE functional use  Visit Diagnosis: Chronic left shoulder pain  Stiffness of left shoulder, not elsewhere classified  Other symptoms and signs involving the musculoskeletal system    Problem List Patient Active Problem List   Diagnosis Date Noted  . Impingement syndrome of left shoulder   . 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 Gastroesophageal reflux disease without esophagitis 12/26/2016  . Grief at loss of child 11/09/2016  . Sternal fracture 10/06/2015  . Contusion of leg 10/06/2015  . Migraine headache 07/24/2013  . Insomnia 05/30/2013  . Fibromyalgia 05/30/2013   Guadelupe Sabin, OTR/L  [phone] 04/03/2018, 4:54 PM  [place] [address] [place], [place], [address] Phone: [phone]   Fax:  [phone]  Name: HALEI HANOVER MRN: [id number] Date of Birth: February 07, 1966

## 2018-04-06 ENCOUNTER — Ambulatory Visit (HOSPITAL_COMMUNITY): Payer: 59 | Admitting: Occupational Therapy

## 2018-04-06 ENCOUNTER — Telehealth (HOSPITAL_COMMUNITY): Payer: Self-pay | Admitting: Occupational Therapy

## 2018-04-06 NOTE — Telephone Encounter (Signed)
She is working over time and can not come in

## 2018-04-11 ENCOUNTER — Ambulatory Visit (HOSPITAL_COMMUNITY): Payer: 59 | Admitting: Specialist

## 2018-04-12 ENCOUNTER — Ambulatory Visit: Payer: 59 | Admitting: Nurse Practitioner

## 2018-04-12 ENCOUNTER — Other Ambulatory Visit
Admission: RE | Admit: 2018-04-12 | Discharge: 2018-04-12 | Disposition: A | Discharge status: Home / Self Care | Payer: 59 | Source: Ambulatory Visit | Attending: Nurse Practitioner | Admitting: Nurse Practitioner

## 2018-04-12 VITALS — BP 110/74 | Temp 98.1°F | Ht 62.0 in | Wt 169.0 lb

## 2018-04-12 DIAGNOSIS — G43009 Migraine without aura, not intractable, without status migrainosus: Secondary | ICD-10-CM | POA: Diagnosis not present

## 2018-04-12 DIAGNOSIS — Z01419 Encounter for gynecological examination (general) (routine) without abnormal findings: Secondary | ICD-10-CM | POA: Insufficient documentation

## 2018-04-12 LAB — CBC WITH DIFFERENTIAL/PLATELET
BASOS ABS: 0.1 10*3/uL (ref 0–0.1)
BASOS PCT: 1 %
EOS ABS: 0.3 10*3/uL (ref 0–0.7)
Eosinophils Relative: 4 %
HCT: 38.5 % (ref 35.0–47.0)
Hemoglobin: 13.1 g/dL (ref 12.0–16.0)
LYMPHS PCT: 35 %
Lymphs Abs: 2.6 10*3/uL (ref 1.0–3.6)
MCH: 29.9 pg (ref 26.0–34.0)
MCHC: 33.9 g/dL (ref 32.0–36.0)
MCV: 88.2 fL (ref 80.0–100.0)
MONO ABS: 0.6 10*3/uL (ref 0.2–0.9)
Monocytes Relative: 8 %
Neutro Abs: 3.7 10*3/uL (ref 1.4–6.5)
Neutrophils Relative %: 52 %
PLATELETS: 244 10*3/uL (ref 150–440)
RBC: 4.36 MIL/uL (ref 3.80–5.20)
RDW: 13.5 % (ref 11.5–14.5)
WBC: 7.3 10*3/uL (ref 3.6–11.0)

## 2018-04-12 LAB — HEPATIC FUNCTION PANEL
ALT: 22 U/L (ref 14–54)
AST: 19 U/L (ref 15–41)
Albumin: 4.2 g/dL (ref 3.5–5.0)
Alkaline Phosphatase: 72 U/L (ref 38–126)
TOTAL PROTEIN: 6.9 g/dL (ref 6.5–8.1)
Total Bilirubin: 0.6 mg/dL (ref 0.3–1.2)

## 2018-04-12 LAB — BASIC METABOLIC PANEL
Anion gap: 7 (ref 5–15)
BUN: 12 mg/dL (ref 6–20)
CHLORIDE: 104 mmol/L (ref 101–111)
CO2: 27 mmol/L (ref 22–32)
CREATININE: 0.6 mg/dL (ref 0.44–1.00)
Calcium: 8.9 mg/dL (ref 8.9–10.3)
GFR calc Af Amer: >60 mL/min (ref >60)
GFR calc non Af Amer: >60 mL/min (ref >60)
GLUCOSE: 102 mg/dL — AB (ref 65–99)
POTASSIUM: 3.7 mmol/L (ref 3.5–5.1)
Sodium: 138 mmol/L (ref 135–145)

## 2018-04-12 LAB — TSH: TSH: 2.466 u[IU]/mL (ref 0.350–4.500)

## 2018-04-12 LAB — LIPID PANEL
CHOL/HDL RATIO: 3.6 ratio
Cholesterol: 178 mg/dL (ref 0–200)
HDL: 49 mg/dL (ref >40)
LDL CALC: 104 mg/dL — AB (ref 0–99)
TRIGLYCERIDES: 127 mg/dL (ref <150)
VLDL: 25 mg/dL (ref 0–40)

## 2018-04-12 NOTE — Patient Instructions (Signed)
Ajovy

## 2018-04-13 ENCOUNTER — Encounter: Payer: Self-pay | Admitting: Nurse Practitioner

## 2018-04-13 ENCOUNTER — Encounter

## 2018-04-13 LAB — VITAMIN D 25 HYDROXY (VIT D DEFICIENCY, FRACTURES): Vit D, 25-Hydroxy: 22.3 ng/mL — ABNORMAL LOW (ref 30.0–100.0)

## 2018-04-13 LAB — HIV ANTIBODY (ROUTINE TESTING W REFLEX): HIV SCREEN 4TH GENERATION: NONREACTIVE

## 2018-04-13 LAB — HEPATITIS C ANTIBODY

## 2018-04-13 NOTE — Progress Notes (Signed)
Subjective: Presents to discuss lab work that she had done earlier today.  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  Also wants to update [place] on her current migraine treatment.  At this time is on [place] for her migraines which is working extremely well.  Takes a rare Xanax for extreme anxiety.  Has started walking more and trying to eat healthier.  Is starting to work more on her weight loss.  Objective:   BP 110/74   Temp 98.1 F (36.7 C) (Oral)   Ht 5\' 2"  (1.575 m)   Wt 169 lb (76.7 kg)   BMI 30.91 kg/m  NAD.  Alert, oriented.  Cheerful affect.  Lungs clear.  Heart regular rate and rhythm. Results for orders placed or performed during the hospital encounter of 04/12/18  CBC with Differential/Platelet  Result Value Ref Range   WBC 7.3 3.6 - 11.0 K/uL   RBC 4.36 3.80 - 5.20 MIL/uL   Hemoglobin 13.1 12.0 - 16.0 g/dL   HCT 38.5 35.0 - 47.0 %   MCV 88.2 80.0 - 100.0 fL   MCH 29.9 26.0 - 34.0 pg   MCHC 33.9 32.0 - 36.0 g/dL   RDW 13.5 11.5 - 14.5 %   Platelets 244 150 - 440 K/uL   Neutrophils Relative % 52 %   Lymphocytes Relative 35 %   Monocytes Relative 8 %   Eosinophils Relative 4 %   Basophils Relative 1 %   Neutro Abs 3.7 1.4 - 6.5 K/uL   Lymphs Abs 2.6 1.0 - 3.6 K/uL   Monocytes Absolute 0.6 0.2 - 0.9 K/uL   Eosinophils Absolute 0.3 0 - 0.7 K/uL   Basophils Absolute 0.1 0 - 0.1 K/uL   Smear Review MORPHOLOGY UNREMARKABLE   VITAMIN D 25 Hydroxy (Vit-D Deficiency, Fractures)  Result Value Ref Range   Vit D, 25-Hydroxy 22.3 (L) 30.0 - 100.0 ng/mL  TSH  Result Value Ref Range   TSH 2.466 0.350 - 4.500 uIU/mL  Hepatic function panel  Result Value Ref Range   Total Protein 6.9 6.5 - 8.1 g/dL   Albumin 4.2 3.5 - 5.0 g/dL   AST 19 15 - 41 U/L   ALT 22 14 - 54 U/L   Alkaline Phosphatase 72 38 - 126 U/L   Total Bilirubin 0.6 0.3 - 1.2 mg/dL   Bilirubin, Direct <0.1 (L) 0.1 - 0.5 mg/dL   Indirect Bilirubin NOT CALCULATED 0.3  - 0.9 mg/dL  Lipid panel  Result Value Ref Range   Cholesterol 178 0 - 200 mg/dL   Triglycerides 127 <150 mg/dL   HDL 49 >40 mg/dL   Total CHOL/HDL Ratio 3.6 RATIO   VLDL 25 0 - 40 mg/dL   LDL Cholesterol 104 (H) 0 - 99 mg/dL  Basic metabolic panel  Result Value Ref Range   Sodium 138 135 - 145 mmol/L   Potassium 3.7 3.5 - 5.1 mmol/L   Chloride 104 101 - 111 mmol/L   CO2 27 22 - 32 mmol/L   Glucose, Bld 102 (H) 65 - 99 mg/dL   BUN 12 6 - 20 mg/dL   Creatinine, Ser 0.60 0.44 - 1.00 mg/dL   Calcium 8.9 8.9 - 10.3 mg/dL   GFR calc non Af Amer >60 >60 mL/min   GFR calc Af Amer >60 >60 mL/min   Anion gap 7 5 - 15  HIV antibody  Result Value Ref Range   HIV Screen 4th  Generation wRfx Non Reactive Non Reactive   At the time of the visit her HIV test and vitamin D were not available.  Reviewed labs with patient and discussed any concerns.    Assessment:   Problem List Items Addressed This Visit      Cardiovascular and Mediastinum   Migraine headache - Primary   Relevant Medications   Erenumab-aooe (AIMOVIG) 70 MG/ML SOAJ       Plan: Encouraged regular activity and weight loss.  Will send a MyChart message once all of her labs are available.  No changes in medication at this point.  Continue follow-up with her headache specialist as planned. Return for follow up as needed.

## 2018-04-16 ENCOUNTER — Ambulatory Visit (HOSPITAL_COMMUNITY)
Admission: RE | Admit: 2018-04-16 | Discharge: 2018-04-16 | Disposition: A | Discharge status: Home / Self Care | Payer: 59 | Source: Ambulatory Visit | Attending: Nurse Practitioner | Admitting: Nurse Practitioner

## 2018-04-16 ENCOUNTER — Other Ambulatory Visit: Payer: Self-pay | Admitting: Nurse Practitioner

## 2018-04-16 ENCOUNTER — Ambulatory Visit (HOSPITAL_COMMUNITY): Payer: 59 | Attending: Orthopedic Surgery

## 2018-04-16 DIAGNOSIS — Z1231 Encounter for screening mammogram for malignant neoplasm of breast: Secondary | ICD-10-CM | POA: Diagnosis not present

## 2018-04-16 MED ORDER — VITAMIN D (ERGOCALCIFEROL) 1.25 MG (50000 UNIT) PO CAPS: 50000.0000 [IU] | ORAL_CAPSULE | ORAL | 0 refills | Status: DC

## 2018-04-17 ENCOUNTER — Encounter (HOSPITAL_COMMUNITY): Payer: Self-pay | Admitting: Occupational Therapy

## 2018-04-17 ENCOUNTER — Telehealth (HOSPITAL_COMMUNITY): Payer: Self-pay | Admitting: Family Medicine

## 2018-04-17 NOTE — Therapy (Signed)
[facility] [address] [place], [place], [address] Phone: [phone]   Fax:  [phone]  Patient Details  Name: Nichole Brown MRN: [id number] Date of Birth: 1966/10/17 Referring Provider:  No ref. provider found     Encounter Date: 04/17/2018   OCCUPATIONAL THERAPY DISCHARGE SUMMARY  Visits from Start of Care: 7  Current functional level related to goals / functional outcomes: Pt called and requested to be discharged from OT services. Pt was due for mini-reassessment, therefore no updated measurements available. Pt's ROM is WFL and strength has been improving, Nichole Brown is able to complete B/IADL tasks with minimal difficulty.    Remaining deficits: Occasional pain/discomfort with sustained use of LUE   Education / Equipment: HEP throughout OT treatments  Plan: Patient agrees to discharge.  Patient goals were partially met. Patient is being discharged due to the patient's request.  ?????       Guadelupe Sabin, OTR/L  [phone] 04/17/2018, 2:33 PM  [facility] [address] [place], [place], [address] Phone: [phone]   Fax:  [phone]

## 2018-04-17 NOTE — Telephone Encounter (Signed)
04/17/18  pt called to cx all appts and said that she wouldn't be back

## 2018-04-19 ENCOUNTER — Ambulatory Visit (HOSPITAL_COMMUNITY): Payer: 59

## 2018-04-23 ENCOUNTER — Encounter (HOSPITAL_COMMUNITY): Payer: Self-pay

## 2018-04-24 DIAGNOSIS — Z0289 Encounter for other administrative examinations: Secondary | ICD-10-CM

## 2018-04-25 ENCOUNTER — Telehealth: Payer: Self-pay | Admitting: Family Medicine

## 2018-04-25 NOTE — Telephone Encounter (Signed)
Message for Nichole Brown dropped off FMLA for you to fill out. She states she spoke with you at her last visit about this so its in your box. She needs this faxed back by 5/17.I let her know you werent in the office until 5/16. We would do are best.

## 2018-04-26 ENCOUNTER — Encounter (HOSPITAL_COMMUNITY): Payer: Self-pay | Admitting: Occupational Therapy

## 2018-04-30 DIAGNOSIS — M7502 Adhesive capsulitis of left shoulder: Secondary | ICD-10-CM | POA: Diagnosis not present

## 2018-04-30 NOTE — Telephone Encounter (Signed)
Done. Given to [place].

## 2018-05-22 DIAGNOSIS — M7502 Adhesive capsulitis of left shoulder: Secondary | ICD-10-CM | POA: Diagnosis not present

## 2018-05-22 DIAGNOSIS — M25512 Pain in left shoulder: Secondary | ICD-10-CM | POA: Diagnosis not present

## 2018-05-30 ENCOUNTER — Ambulatory Visit: Payer: 59 | Admitting: Physical Therapy

## 2018-06-05 ENCOUNTER — Ambulatory Visit: Payer: 59 | Admitting: Physical Therapy

## 2018-06-07 ENCOUNTER — Ambulatory Visit: Payer: 59

## 2018-06-07 ENCOUNTER — Ambulatory Visit: Payer: 59 | Admitting: Physical Therapy

## 2018-06-11 ENCOUNTER — Ambulatory Visit: Payer: Self-pay | Admitting: Nurse Practitioner

## 2018-06-11 ENCOUNTER — Ambulatory Visit: Payer: 59

## 2018-06-18 ENCOUNTER — Encounter: Payer: Self-pay | Admitting: Physical Therapy

## 2018-06-18 ENCOUNTER — Other Ambulatory Visit: Payer: Self-pay

## 2018-06-18 ENCOUNTER — Ambulatory Visit: Payer: 59 | Attending: Orthopedic Surgery | Admitting: Physical Therapy

## 2018-06-18 DIAGNOSIS — M25612 Stiffness of left shoulder, not elsewhere classified: Secondary | ICD-10-CM | POA: Insufficient documentation

## 2018-06-18 DIAGNOSIS — G8929 Other chronic pain: Secondary | ICD-10-CM | POA: Diagnosis not present

## 2018-06-18 DIAGNOSIS — M25512 Pain in left shoulder: Secondary | ICD-10-CM | POA: Insufficient documentation

## 2018-06-18 DIAGNOSIS — M6281 Muscle weakness (generalized): Secondary | ICD-10-CM | POA: Insufficient documentation

## 2018-06-18 NOTE — Therapy (Addendum)
[place] [facility] SERVICES [address] [place], [place], [address] Phone: [phone]   Fax:  [phone]  Physical Therapy Treatment  Patient Details  Name: Nichole Brown MRN: [id number] Date of Birth: 22-Jul-1966 Referring Provider: Dr. Onnie Graham   Encounter Date: 06/18/2018  PT End of Session - 06/18/18 1044    Visit Number  1    Number of Visits  13    Date for PT Re-Evaluation  07/30/18    Authorization Type  progress note 1/10    PT Start Time  0802    PT Stop Time  0845    PT Time Calculation (min)  43 min    Activity Tolerance  Patient tolerated treatment well    Behavior During Therapy  [facility] for tasks assessed/performed       Past Medical History:  Diagnosis Date  . Anxiety   . Bronchitis   . Depression   . Fibromyalgia 2005  . GERD (gastroesophageal reflux disease)   . Insomnia   . Migraine headache   . Pneumonia     Past Surgical History:  Procedure Laterality Date  . ABDOMINAL HYSTERECTOMY    . CESAREAN SECTION    . CHOLECYSTECTOMY  1988  . open heart surgery  1967  . PATENT DUCTUS ARTERIOUS REPAIR  1967  . SHOULDER ARTHROSCOPY Left 06/16/2017   Procedure: LEFT SHOULDER ARTHROSCOPY, DEBRIDEMENT, AND DECOMPRESSION;  Surgeon: Newt Minion, MD;  Location: [place];  Service: Orthopedics;  Laterality: Left;  . SHOULDER SURGERY Left 2008   AC joint    There were no vitals filed for this visit.  Subjective Assessment - 06/18/18 0754    Subjective  Pt reports she had surgery on L shoulder 06/16/2017 for RC tear; been to therapy previously. Pt received cortisone injection about 3 weeks ago due to shoulder 'freezing up on me.' Pt reports pain in L shoulder has never gone away and has tingling all the way down into her fingers.     Pertinent History  52 y.o. female presenting with chief complaint of L shoulder pain. PMH significant for anxiety, depression, migraine headaches, insomnia, and L shoulder surgery in 2008 (AC  ligament) and 2018 (RC tear). Pt received cortisone injection 3 weeks ago (04/30/2018) due to shoulder freezing. Pt has unrelenting pain radiating down into fingers with tingling sensation all the way to fingertips. Pt has 4 grandsons she wants to be able to play with without having shoulder pain.    Limitations  Lifting;House hold activities    How long can you sit comfortably?  N/A    How long can you stand comfortably?  N/A    How long can you walk comfortably?  N/A    Diagnostic tests  None since surgery in 2018    Patient Stated Goals  "No pain; be able to do my job without L shoulder getting tired and weak"    Currently in Pain?  Yes    Pain Score  6     Pain Location  Shoulder    Pain Orientation  Left    Pain Descriptors / Indicators  Burning;Tingling;Shooting;Sharp    Pain Type  Chronic pain    Pain Radiating Towards  Down to fingers    Pain Onset  More than a month ago Less pain before surgery in 06/2017; has not been painfree since surgery    Pain Frequency  Constant    Aggravating Factors   Weight bearing through L elbow, holding something  for 5-10 minutes, sweeping, laying on L side    Pain Relieving Factors  Ice, ibuprofen/tylenol     Effect of Pain on Daily Activities  moderate    Multiple Pain Sites  No         [facility] PT Assessment - 06/18/18 0001      Assessment   Medical Diagnosis  L shoulder pain    Referring Provider  Dr. Onnie Graham    Onset Date/Surgical Date  06/16/17    Hand Dominance  Right    Next MD Visit  06/25/2018    Prior Therapy  PT and OT in [place] dry needling, ROM, strengthening, stretching, joint mobs      Precautions   Precautions  None      Restrictions   Weight Bearing Restrictions  No      Balance Screen   Has the patient fallen in the past 6 months  No    Has the patient had a decrease in activity level because of a fear of falling?   No    Is the patient reluctant to leave their home because of a fear of falling?   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, 3 years, 2 years, 4 months)      Cognition   Overall Cognitive Status  Within Functional Limits for tasks assessed      Observation/Other Assessments   Observations  Rounded shoulders, forward head    Quick DASH   77.3% (severe disability)      Coordination   Gross Motor Movements are Fluid and Coordinated  Yes    Fine Motor Movements are Fluid and Coordinated  Yes      AROM   Overall AROM Comments  assessed in sitting, increased pain with all motions    Left Shoulder Extension  24 Degrees    Left Shoulder Flexion  145 Degrees    Left Shoulder ABduction  140 Degrees    Left Shoulder Internal Rotation  90 Degrees    Left Shoulder External Rotation  5 Degrees      Strength   Overall Strength Comments  Limited in L strength 2/2 pain    Right Shoulder ABduction  5/5    Right Shoulder Internal Rotation  5/5    Right Shoulder External Rotation  5/5    Left Shoulder Flexion  4/5    Left Shoulder ABduction  4/5    Left Shoulder Internal Rotation  4/5    Left Shoulder External Rotation  4/5    Right Elbow Flexion  5/5    Right Elbow Extension  5/5    Left Elbow Flexion  4/5    Left Elbow Extension  4/5      Neer Impingement test    Findings  Positive    Side  Left    Comments  Extremely  painful (7/10), shrug sign present      Hawkins-Kennedy test   Findings  Positive    Side  Left    Comments  Painful      Empty Can test   Findings  Positive    Side  Left    Comment   Painful and weak      Full Can test   Findings  Positive    Side  Left    Comment  Painful and weak      Transfers   Comments  transfers independently      Ambulation/Gait   Gait Comments  ambulates independently, normal gait pattern      High Level Balance   High Level Balance Comments  exhibits normal standing balance;                            PT Education - 06/18/18 0757    Education provided  Yes    Education Details  plan of care, recommendations    Person(s) Educated  Patient    Methods  Explanation    Comprehension  Verbalized understanding;Verbal cues required;Need further instruction       PT Short Term Goals - 06/18/18 1102      PT SHORT TERM GOAL #1   Title  Patient will report decreased left shoulder pain at rest and during activity > 10 min as < 5/10.    Baseline  Current pain level on 7/8: 6/10    Time  3    Period  Weeks    Status  New    Target Date  07/09/18      PT SHORT TERM GOAL #2   Title  Patient will be independent in home exercise program to improve strength/mobility for better functional independence with ADLs.    Time  3    Period  Weeks    Status  New    Target Date  07/09/18        PT Long Term Goals - 06/18/18 1104      PT LONG TERM GOAL #1   Title  Patient will be able to work for at least 4 hours without increase in pain level > 4/10 to improve ability to perform work duties.     Baseline  7/8: painful at all times    Time  6    Period  Weeks    Status  New    Target Date  07/30/18      PT LONG TERM GOAL #2   Title  Patient will demonstrate L shoulder strength as 4+/5 or greater in order to be able to complete work duties and hold grandson without L shoulder fatigue.    Baseline  Grossly 4/5    Time  6    Period  Weeks    Status  New    Target Date  07/30/18      PT LONG TERM GOAL #3   Title  Patient will improve Quick-DASH by at least 10% to demonstrated decreased disability with functional tasks  and improved independence with ADLs.    Baseline  7/8: 77.3%    Time  6    Period  Weeks    Status  New    Target Date  07/30/18      PT LONG TERM GOAL #4   Title  Pt will demonstrate increased L shoulder AROM as follows: extension 35 deg, flexion 155 deg, ABduction 150  deg, ER 20 deg without increased pain or Shrug sign.    Baseline  L ext: 24 deg, L flex: 145 deg, L ABD: 140 deg, L ER 5 deg    Time  6    Period  Weeks    Status  New    Target Date  07/30/18            Plan - 06/18/18 1044    Clinical Impression Statement  Pt is a 52 y.o. female with chief complaint of L shoulder pain. Pt has significant history of L shoulder surgery in 2008 for [facility] ligament and 2018 for RC injury. Pt has had physical therapy previously including dry needling, joint mobilizations, strengthening, stretching, and ROM exercise. Pt received cortisone injection at the end of May 2019 due to freezing of shoulder with referral to PT following injection. Pt indicated pain currently as 6/10 with 10/10 at worst and 4/10 at best. Pt demonstrated decreased AROM and decreased strength; shrug sign present in all ROM. Pt resulted in positive Neer's impingement test, Hawkins-Kennedy test, Full Can test, and Empty Can test with pain and weakness present in all tests. Pt will benefit from skilled PT intervention for L shoulder pain, decreased mobility, and decreased strength.    History and Personal Factors relevant to plan of care:  (+) motivated, age, family support (-) previous shoulder surgeries, anxiety/depression, unsuccessful PT prior, high pain level    Clinical Presentation  Evolving    Clinical Presentation due to:  Previous shoulder surgeries and treatments, anxiety/depression, constant moderate to high pain level    Clinical Decision Making  Moderate    Rehab Potential  Fair    Clinical Impairments Affecting Rehab Potential  History of shoulder surgeries, anxiety/depression, chronicity of pain    PT Frequency   2x / week    PT Duration  6 weeks    PT Treatment/Interventions  Cryotherapy;Electrical Stimulation;Moist Heat;Ultrasound;Therapeutic activities;Therapeutic exercise;Neuromuscular re-education;Patient/family education;Manual techniques;Passive range of motion;Dry needling;Energy conservation;Taping    PT Next Visit Plan  HEP, assess passive accessory motion, focus on pain relief    PT Home Exercise Plan  not given at this visit    Consulted and Agree with Plan of Care  Patient       Patient will benefit from skilled therapeutic intervention in order to improve the following deficits and impairments:  Decreased mobility, Decreased range of motion, Decreased strength, Hypomobility, Increased muscle spasms, Impaired perceived functional ability, Impaired UE functional use, Postural dysfunction, Pain  Visit Diagnosis: Chronic left shoulder pain  Stiffness of left shoulder, not elsewhere classified  Muscle weakness (generalized)     Problem List Patient Active Problem List   Diagnosis Date Noted  . Impingement syndrome of left shoulder   . 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 Gastroesophageal reflux disease without esophagitis 12/26/2016  . Grief at loss of child 11/09/2016  . Sternal fracture 10/06/2015  . Contusion of leg 10/06/2015  . Migraine headache 07/24/2013  . Insomnia 05/30/2013  . Fibromyalgia 05/30/2013   Harriet Masson, SPT This entire session was performed under direct supervision and direction of a licensed therapist/therapist assistant . I have personally read, edited and approve of the note as written.  Trotter,Margaret PT, DPT 06/18/2018, 5:33 PM  [place] MAIN [facility] SERVICES [address] [place], [place], [address] Phone: [phone]   Fax:  [phone]  Name: Nichole Brown MRN: [id number] Date of Birth: [birth year]

## 2018-06-19 ENCOUNTER — Encounter: Payer: 59 | Admitting: Physical Therapy

## 2018-06-21 ENCOUNTER — Encounter: Payer: 59 | Admitting: Physical Therapy

## 2018-06-22 ENCOUNTER — Ambulatory Visit: Payer: 59 | Admitting: Family Medicine

## 2018-06-22 DIAGNOSIS — G43709 Chronic migraine without aura, not intractable, without status migrainosus: Secondary | ICD-10-CM | POA: Diagnosis not present

## 2018-06-25 ENCOUNTER — Encounter: Payer: Self-pay | Admitting: Physical Therapy

## 2018-06-25 ENCOUNTER — Ambulatory Visit: Payer: 59 | Admitting: Physical Therapy

## 2018-06-25 DIAGNOSIS — M6281 Muscle weakness (generalized): Secondary | ICD-10-CM | POA: Diagnosis not present

## 2018-06-25 DIAGNOSIS — M25612 Stiffness of left shoulder, not elsewhere classified: Secondary | ICD-10-CM | POA: Diagnosis not present

## 2018-06-25 DIAGNOSIS — M25512 Pain in left shoulder: Principal | ICD-10-CM

## 2018-06-25 DIAGNOSIS — G8929 Other chronic pain: Secondary | ICD-10-CM

## 2018-06-25 NOTE — Therapy (Addendum)
[place] MAIN [facility] SERVICES [address] [place], [place], [address] Phone: [phone]   Fax:  [phone]  Physical Therapy Treatment  Patient Details  Name: Nichole Brown MRN: [id number] Date of Birth: [birth year] Referring Provider: Dr. Onnie Graham   Encounter Date: 06/25/2018  PT End of Session - 06/25/18 1427    Visit Number  2    Number of Visits  13    Date for PT Re-Evaluation  07/30/18    Authorization Type  progress note 2/10    PT Start Time  1432    PT Stop Time  1519    PT Time Calculation (min)  47 min    Activity Tolerance  Patient tolerated treatment well;Patient limited by pain    Behavior During Therapy  [facility] for tasks assessed/performed       Past Medical History:  Diagnosis Date  . Anxiety   . Bronchitis   . Depression   . Fibromyalgia 2005  . GERD (gastroesophageal reflux disease)   . Insomnia   . Migraine headache   . Pneumonia     Past Surgical History:  Procedure Laterality Date  . ABDOMINAL HYSTERECTOMY    . CESAREAN SECTION    . CHOLECYSTECTOMY  1988  . open heart surgery  1967  . PATENT DUCTUS ARTERIOUS REPAIR  1967  . SHOULDER ARTHROSCOPY Left 06/16/2017   Procedure: LEFT SHOULDER ARTHROSCOPY, DEBRIDEMENT, AND DECOMPRESSION;  Surgeon: Newt Minion, MD;  Location: [facility];  Service: Orthopedics;  Laterality: Left;  . SHOULDER SURGERY Left 2008   AC joint    There were no vitals filed for this visit.  Subjective Assessment - 06/25/18 1428    Subjective  Patient reports she had increased pain yesterday 8-10/10 when holding her 38-month old grandson; pain comes on immediately when picking up grandson and progresses up to the 10/10. Pt reports feeling numbness all the way down her arm to her fingertips that comes and goes.     Pertinent History  52 y.o. female presenting with chief complaint of L shoulder pain. PMH significant for anxiety, depression, migraine headaches, insomnia, and L shoulder surgery  in 2008 (AC ligament) and 2018 (RC tear). Pt received cortisone injection 3 weeks ago (04/30/2018) due to shoulder freezing. Pt has unrelenting pain radiating down into fingers with tingling sensation all the way to fingertips. Pt has 4 grandsons she wants to be able to play with without having shoulder pain.    Limitations  Lifting;House hold activities    How long can you sit comfortably?  N/A    How long can you stand comfortably?  N/A    How long can you walk comfortably?  N/A    Diagnostic tests  None since surgery in 2018    Patient Stated Goals  "No pain; be able to do my job without L shoulder getting tired and weak"    Currently in Pain?  Yes    Pain Score  4     Pain Location  Shoulder    Pain Orientation  Left    Pain Descriptors / Indicators  Burning;Tingling;Sharp    Pain Type  Chronic pain    Pain Radiating Towards  down to fingers    Pain Onset  More than a month ago Less pain before surgery in 06/2017; has not been painfree since surgery    Pain Frequency  Constant    Multiple Pain Sites  No  Treatment Tinel's Sign (+) Phalen's (+) Reverse Phalen's (-)   Nerve tension tests: Median nerve (+) with N/T into hand, some pain Radial nerve (-) some pain in shoulder Ulnar (+) with N/T to finger tips and most painful  PROM: L shoulder abduction about 60 deg before increased pain L shoulder ER 2 deg before increased pain L shoulder flexion 120 deg before increased pain   PT performed L shoulder abduction/adduction between 20 and 60 deg with gentle, passive motions to promote pain relief and increase motion in the shoulder x30 sec bouts x3 bouts  L shoulder Joint mobilizations: Inferior glide grade I and II 15 sec bouts x4 bouts, no increase in pain; used to decrease pain and promote motion in the shoulder joint Posterior glide grade II assessed; increased pain in front of shoulder Anterior glide grade II assessed; did not change the front of the shoulder and  felt some stretch in posterior shoulder   PT assessed passive accessory motion of L shoulder joint with noted hypomobility throughout the joint; potentially due to increased muscle tension and guarding as well as joint hypomobility.  Pec stretch in supine with L arm supported by PT to maintain comfort while stretching L pec muscles; 30 sec bouts x3 reps  McConnell taping of L shoulder for scapular retraction and shoulder ER: 2 pieces from acromion around to inferior border of scapula 1 piece from acromion around to medial border of scapula  Goal of taping to help improve posture and allow tape to act as muscles would to try to help alleviate some pain in L shoulder with more upright posture Educated patient on safe tape wear and to remove if feels warm, itching, or if skin starts to turn red as these are signs of allergic reaction. Patient verbalized understanding;    PT Education - 06/25/18 1425    Education provided  Yes    Education Details  passive range of motion, joint mobilizations, stretching, taping    Person(s) Educated  Patient    Methods  Explanation;Demonstration;Verbal cues    Comprehension  Verbalized understanding;Returned demonstration;Verbal cues required;Need further instruction       PT Short Term Goals - 06/18/18 1102      PT SHORT TERM GOAL #1   Title  Patient will report decreased left shoulder pain at rest and during activity > 10 min as < 5/10.    Baseline  Current pain level on 7/8: 6/10    Time  3    Period  Weeks    Status  New    Target Date  07/09/18      PT SHORT TERM GOAL #2   Title  Patient will be independent in home exercise program to improve strength/mobility for better functional independence with ADLs.    Time  3    Period  Weeks    Status  New    Target Date  07/09/18        PT Long Term Goals - 06/18/18 1104      PT LONG TERM GOAL #1   Title  Patient will be able to work for at least 4 hours without increase in pain level > 4/10  to improve ability to perform work duties.     Baseline  7/8: painful at all times    Time  6    Period  Weeks    Status  New    Target Date  07/30/18      PT LONG TERM GOAL #2  Title  Patient will demonstrate L shoulder strength as 4+/5 or greater in order to be able to complete work duties and hold grandson without L shoulder fatigue.    Baseline  Grossly 4/5    Time  6    Period  Weeks    Status  New    Target Date  07/30/18      PT LONG TERM GOAL #3   Title  Patient will improve Quick-DASH by at least 10% to demonstrated decreased disability with functional tasks and improved independence with ADLs.    Baseline  7/8: 77.3%    Time  6    Period  Weeks    Status  New    Target Date  07/30/18      PT LONG TERM GOAL #4   Title  Pt will demonstrate increased L shoulder AROM as follows: extension 35 deg, flexion 155 deg, ABduction 150 deg, ER 20 deg without increased pain or Shrug sign.    Baseline  L ext: 24 deg, L flex: 145 deg, L ABD: 140 deg, L ER 5 deg    Time  6    Period  Weeks    Status  New    Target Date  07/30/18            Plan - 06/25/18 1427    Clinical Impression Statement  Patient tolerated therapy session well. PT performed special tests to assess thoracic outlet syndrome, Adson's test negative, and carpal tunnel syndrome, positive phalen's and tinel's with negative reverse phalen's. PT assessed nerve tension tests with medial nerve and ulnar nerve causing increased numbness/tingling with radial nerve being the least uncomfortable. PT assessed joint mobility with passive ROM and passive accessory ROM; demonstrated hypomobility due to muscle tightness and tension as well as pain. Pt demonstrates significant muscle guarding throughout therapy activities. Pt appears to have significant fear of movement with L shoulder; vocalizes desire to have imaging done to confirm structurally intact prior to doing too much movement. Pt demonstrates inconsistency with test  results; reports pain with all movements and at rest. PT performed McConnell taping of L shoulder to improve L shoulder posture; tape to increase scapular retraction and shoulder ER. Pt would continue to benefit from skilled PT intervention to address L shoulder pain, hypomobility, and strength deficits.    Rehab Potential  Fair    Clinical Impairments Affecting Rehab Potential  History of shoulder surgeries, anxiety/depression, chronicity of pain    PT Frequency  2x / week    PT Duration  6 weeks    PT Treatment/Interventions  Cryotherapy;Electrical Stimulation;Moist Heat;Ultrasound;Therapeutic activities;Therapeutic exercise;Neuromuscular re-education;Patient/family education;Manual techniques;Passive range of motion;Dry needling;Energy conservation;Taping    PT Next Visit Plan  HEP, assess passive accessory motion, focus on pain relief    PT Home Exercise Plan  not given at this visit    Consulted and Agree with Plan of Care  Patient       Patient will benefit from skilled therapeutic intervention in order to improve the following deficits and impairments:  Decreased mobility, Decreased range of motion, Decreased strength, Hypomobility, Increased muscle spasms, Impaired perceived functional ability, Impaired UE functional use, Postural dysfunction, Pain  Visit Diagnosis: Chronic left shoulder pain  Stiffness of left shoulder, not elsewhere classified  Muscle weakness (generalized)     Problem List Patient Active Problem List   Diagnosis Date Noted  . Impingement syndrome of left shoulder   . 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 Gastroesophageal reflux disease without  esophagitis 12/26/2016  . Grief at loss of child 11/09/2016  . Sternal fracture 10/06/2015  . Contusion of leg 10/06/2015  . Migraine headache 07/24/2013  . Insomnia 05/30/2013  . Fibromyalgia 05/30/2013   Harriet Masson, SPT This entire session was performed under direct supervision and direction of  a licensed therapist/therapist assistant . I have personally read, edited and approve of the note as written.   Trotter,Margaret PT, DPT 06/26/2018, 8:28 AM  [place] MAIN [facility] SERVICES [address] [place], [place], [address] Phone: [phone]   Fax:  [phone]  Name: Nichole Brown MRN: [id number] Date of Birth: 10-Sep-1966

## 2018-06-27 ENCOUNTER — Ambulatory Visit: Payer: 59 | Admitting: Physical Therapy

## 2018-06-28 IMAGING — MG DIGITAL SCREENING BILATERAL MAMMOGRAM WITH CAD
4 series · 4 of 4 positions shown · non-contrast
Comparison: Previous exam(s).

CLINICAL DATA: Screening.

EXAM:
DIGITAL SCREENING BILATERAL MAMMOGRAM WITH CAD

[R MLO]
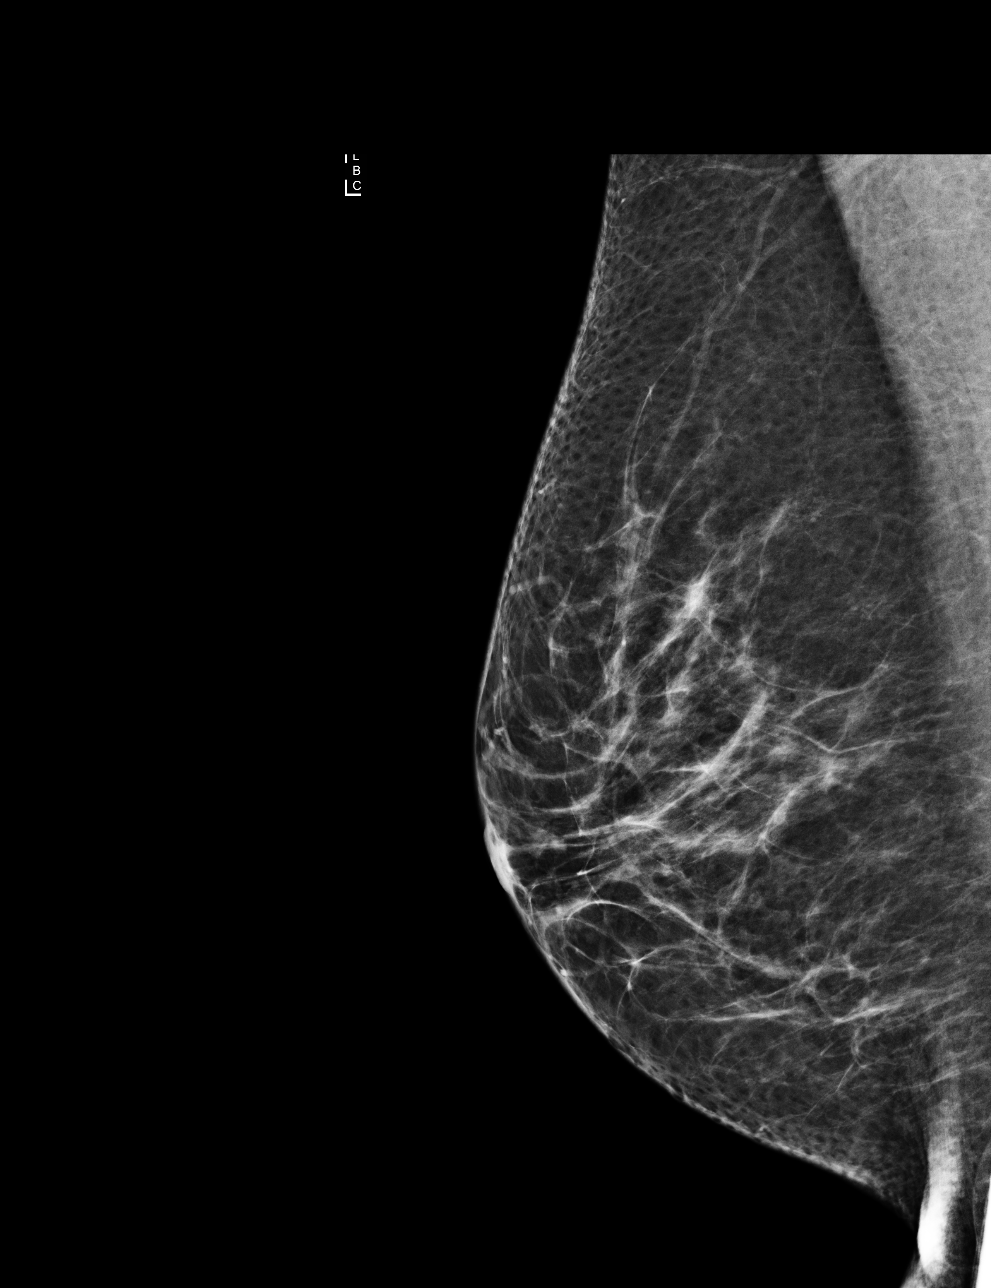

[L CC]
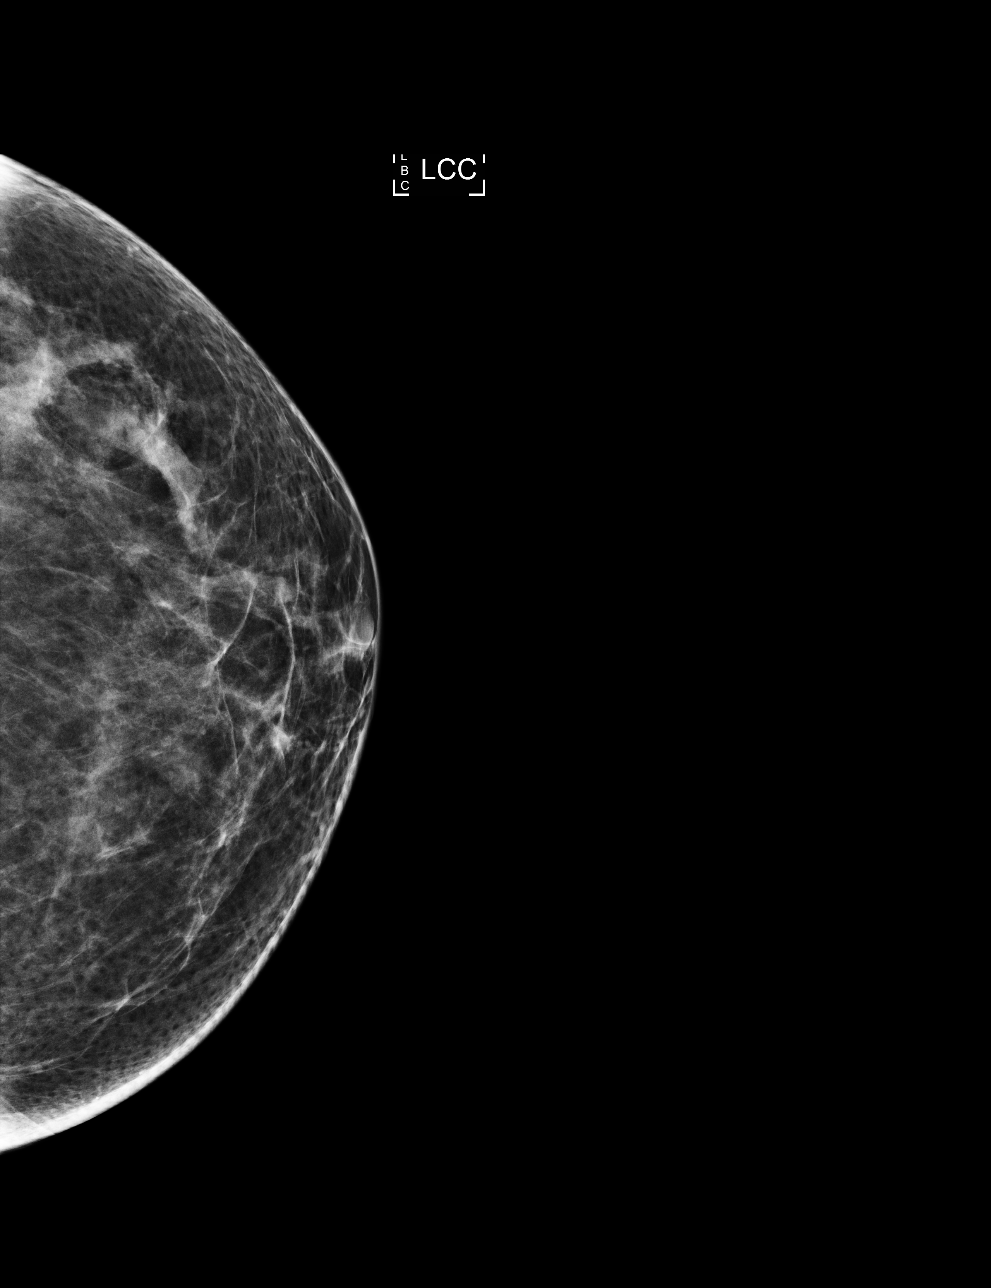

[R CC]
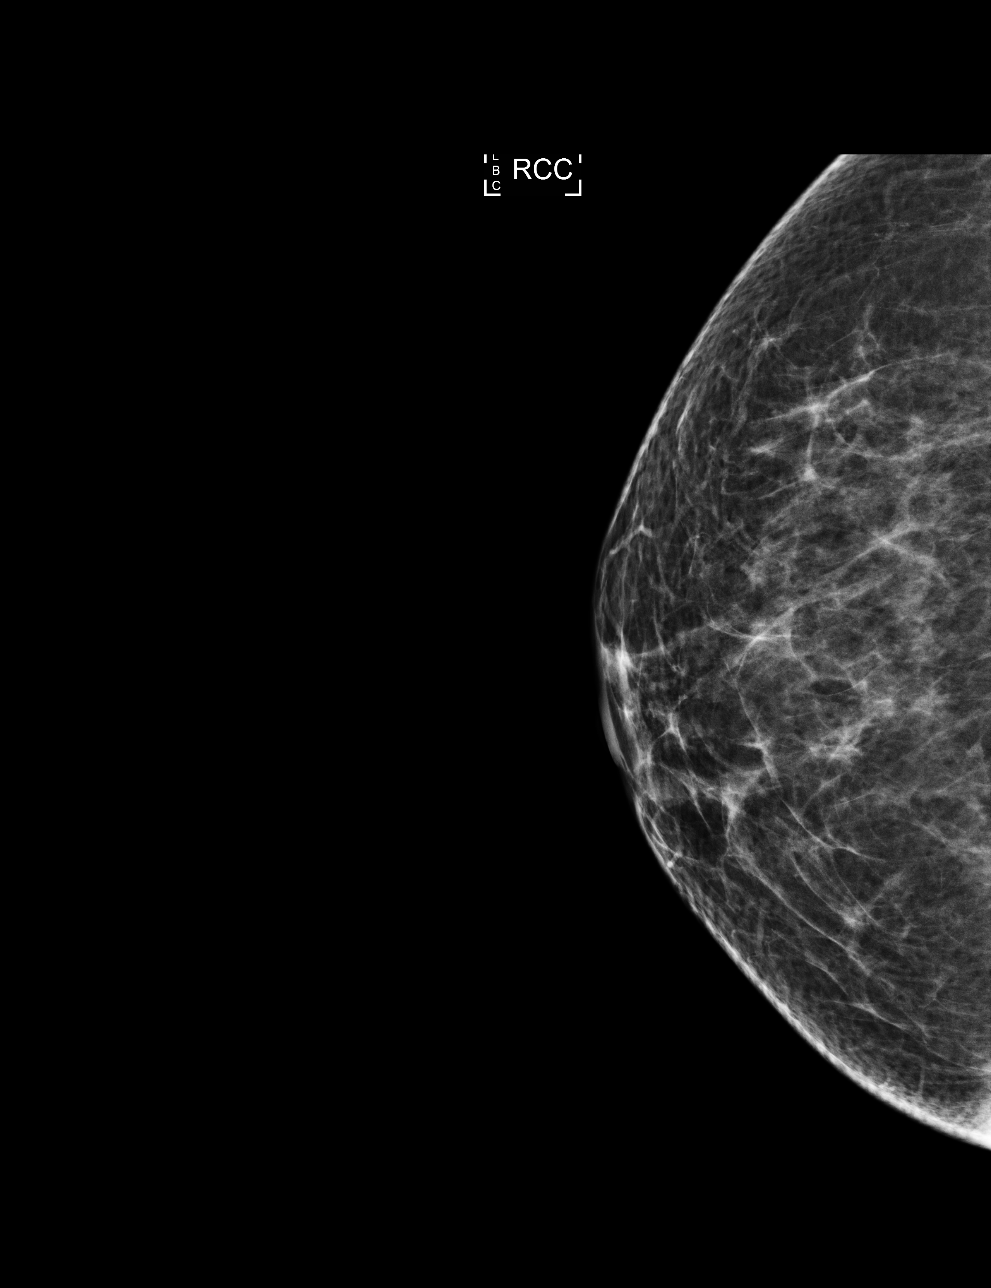

[L MLO]
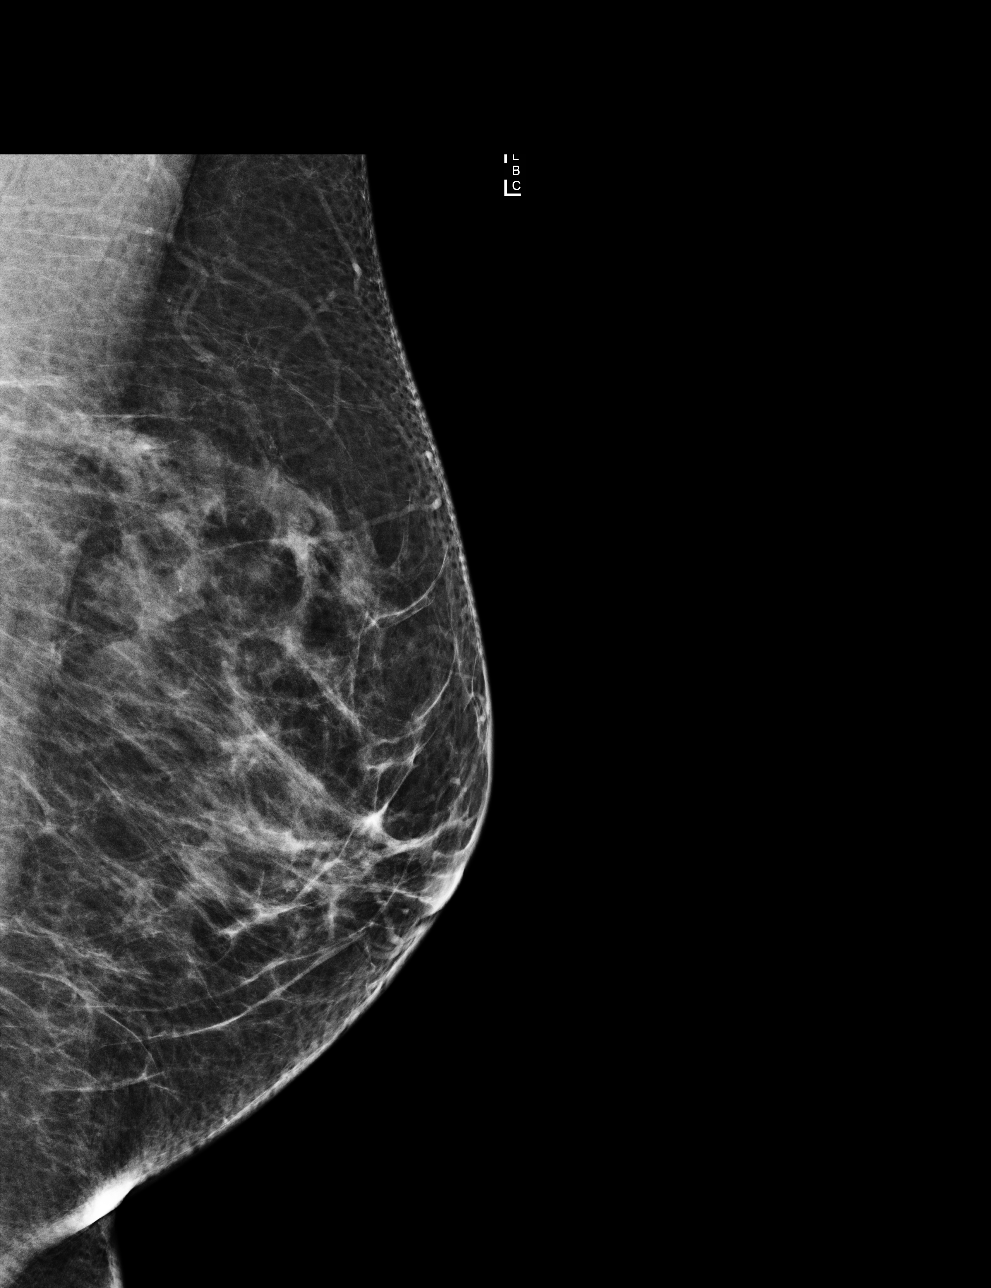

[4 of 4 positions shown; findings below may reference images not displayed]

ACR Breast Density Category b: There are scattered areas of
fibroglandular density.
FINDINGS: There are no findings suspicious for malignancy. Images were
processed with CAD.
IMPRESSION: No mammographic evidence of malignancy. A result letter of this
screening mammogram will be mailed directly to the patient.

RECOMMENDATION:
Screening mammogram in one year. (Code:AS-G-LCT)

BI-RADS CATEGORY  1: Negative.

## 2018-07-02 ENCOUNTER — Ambulatory Visit: Payer: 59

## 2018-07-02 DIAGNOSIS — M25612 Stiffness of left shoulder, not elsewhere classified: Secondary | ICD-10-CM

## 2018-07-02 DIAGNOSIS — M6281 Muscle weakness (generalized): Secondary | ICD-10-CM | POA: Diagnosis not present

## 2018-07-02 DIAGNOSIS — G8929 Other chronic pain: Secondary | ICD-10-CM

## 2018-07-02 DIAGNOSIS — M25512 Pain in left shoulder: Principal | ICD-10-CM

## 2018-07-02 DIAGNOSIS — Z0289 Encounter for other administrative examinations: Secondary | ICD-10-CM

## 2018-07-02 NOTE — Therapy (Signed)
[place] MAIN [facility] SERVICES [place], [place], [address] Phone: [phone]   Fax:  [phone]  Physical Therapy Treatment  Patient Details  Name: Nichole Brown MRN: [id number] Date of Birth: [birth year] Referring Provider: Dr. Onnie Graham   Encounter Date: 07/02/2018  PT End of Session - 07/02/18 1524    Visit Number  3    Number of Visits  13    Date for PT Re-Evaluation  07/30/18    Authorization Type  progress note 3/10    PT Start Time  1430    PT Stop Time  1514    PT Time Calculation (min)  44 min    Activity Tolerance  Patient tolerated treatment well;Patient limited by pain    Behavior During Therapy  [facility] for tasks assessed/performed       Past Medical History:  Diagnosis Date  . Anxiety   . Bronchitis   . Depression   . Fibromyalgia 2005  . GERD (gastroesophageal reflux disease)   . Insomnia   . Migraine headache   . Pneumonia     Past Surgical History:  Procedure Laterality Date  . ABDOMINAL HYSTERECTOMY    . CESAREAN SECTION    . CHOLECYSTECTOMY  1988  . open heart surgery  1967  . PATENT DUCTUS ARTERIOUS REPAIR  1967  . SHOULDER ARTHROSCOPY Left 06/16/2017   Procedure: LEFT SHOULDER ARTHROSCOPY, DEBRIDEMENT, AND DECOMPRESSION;  Surgeon: Newt Minion, MD;  Location: [place];  Service: Orthopedics;  Laterality: Left;  . SHOULDER SURGERY Left 2008   AC joint    There were no vitals filed for this visit.  Subjective Assessment - 07/02/18 1522    Subjective  Patient reports 7/10 pain today due to just getting out of work. Has been having pain in her shoulder when trying to play with her grandchildren. Has been having numbness and coldness down arm in certain positions.     Pertinent History  52 y.o. female presenting with chief complaint of L shoulder pain. PMH significant for anxiety, depression, migraine headaches, insomnia, and L shoulder surgery in 2008 (AC ligament) and 2018 (RC tear). Pt received  cortisone injection 3 weeks ago (04/30/2018) due to shoulder freezing. Pt has unrelenting pain radiating down into fingers with tingling sensation all the way to fingertips. Pt has 4 grandsons she wants to be able to play with without having shoulder pain.    Limitations  Lifting;House hold activities    How long can you sit comfortably?  N/A    How long can you stand comfortably?  N/A    How long can you walk comfortably?  N/A    Diagnostic tests  None since surgery in 2018    Patient Stated Goals  "No pain; be able to do my job without L shoulder getting tired and weak"    Currently in Pain?  Yes    Pain Score  7     Pain Location  Shoulder    Pain Orientation  Left    Pain Descriptors / Indicators  Stabbing;Burning    Pain Type  Chronic pain;Neuropathic pain    Pain Onset  More than a month ago Less pain before surgery in 06/2017; has not been painfree since surgery    Pain Frequency  Constant     Supine: L GH  Distraction of GH with gentle increase in traction 4x20 seconds  Rhythmic pertubations of GH joint to decrease muscle tension 3 minutes  PROM : three  point hold, flexion, abduction, ER, and IR 3x60 seconds. C/o numbness/ coldness in ER and IR. Pain in bicep for flexion   Bicep trigger point release with movement : flexion and supination into extension and pronation 5 minutes  Mobilization of bicep tendon laterally into intertrochanteric groove: initially tender with decreased pain upon repetition.  [place] AP mobilizations: initially allow patient to feel pressure and increase grade with repetition ;I-III grade 3 minutes  GH Inferior mobilization:  initially allow patient to feel pressure and increase grade with repetition ;I-III grade 3 minutes  GH PA mobilizations: performed in supine due to patient's pain in prone.  Grade I-III, 2 minutes   First rib mobilization (L) 3 minutes grade I-II  Suboccipital release 2x30 seconds  Ice cup massage 4 minutes  Side lying: Scapular  PROM: protract retract 10x, elevate depress 10x,   Palpation of supraspinatus, upper trap, middle trap, and levator scapulae, no change in numbness/tingling                          PT Education - 07/02/18 1523    Education provided  Yes    Education Details  PROM, posture, muscle guarding    Person(s) Educated  Patient    Methods  Explanation;Demonstration;Verbal cues    Comprehension  Verbalized understanding;Returned demonstration       PT Short Term Goals - 06/18/18 1102      PT SHORT TERM GOAL #1   Title  Patient will report decreased left shoulder pain at rest and during activity > 10 min as < 5/10.    Baseline  Current pain level on 7/8: 6/10    Time  3    Period  Weeks    Status  New    Target Date  07/09/18      PT SHORT TERM GOAL #2   Title  Patient will be independent in home exercise program to improve strength/mobility for better functional independence with ADLs.    Time  3    Period  Weeks    Status  New    Target Date  07/09/18        PT Long Term Goals - 06/18/18 1104      PT LONG TERM GOAL #1   Title  Patient will be able to work for at least 4 hours without increase in pain level > 4/10 to improve ability to perform work duties.     Baseline  7/8: painful at all times    Time  6    Period  Weeks    Status  New    Target Date  07/30/18      PT LONG TERM GOAL #2   Title  Patient will demonstrate L shoulder strength as 4+/5 or greater in order to be able to complete work duties and hold grandson without L shoulder fatigue.    Baseline  Grossly 4/5    Time  6    Period  Weeks    Status  New    Target Date  07/30/18      PT LONG TERM GOAL #3   Title  Patient will improve Quick-DASH by at least 10% to demonstrated decreased disability with functional tasks and improved independence with ADLs.    Baseline  7/8: 77.3%    Time  6    Period  Weeks    Status  New    Target Date  07/30/18      PT  LONG TERM GOAL #4   Title  Pt  will demonstrate increased L shoulder AROM as follows: extension 35 deg, flexion 155 deg, ABduction 150 deg, ER 20 deg without increased pain or Shrug sign.    Baseline  L ext: 24 deg, L flex: 145 deg, L ABD: 140 deg, L ER 5 deg    Time  6    Period  Weeks    Status  New    Target Date  07/30/18            Plan - 07/02/18 1533    Clinical Impression Statement  Patient presents with high pain level due to being at end of work day. Will receive her MRI next Tuesday. Reports pain in intertrochanteric groove area primarily at this time that was relieved with mobilization of [place] joint in inferior direction, bicep tendon trigger point and manipulation, as well as ice cup massage. Patient educated on muscle tension cycle upon which when she feels a motion may be painful she tenses musculature therefore causing the original task to become painful when it may not have been. Pt will benefit from skilled PT intervention for L shoulder pain, decreased mobility, and decreased strength.    Rehab Potential  Fair    Clinical Impairments Affecting Rehab Potential  History of shoulder surgeries, anxiety/depression, chronicity of pain    PT Frequency  2x / week    PT Duration  6 weeks    PT Treatment/Interventions  Cryotherapy;Electrical Stimulation;Moist Heat;Ultrasound;Therapeutic activities;Therapeutic exercise;Neuromuscular re-education;Patient/family education;Manual techniques;Passive range of motion;Dry needling;Energy conservation;Taping    PT Next Visit Plan  HEP, assess passive accessory motion, focus on pain relief    PT Home Exercise Plan  not given at this visit    Consulted and Agree with Plan of Care  Patient       Patient will benefit from skilled therapeutic intervention in order to improve the following deficits and impairments:  Decreased mobility, Decreased range of motion, Decreased strength, Hypomobility, Increased muscle spasms, Impaired perceived functional ability, Impaired UE  functional use, Postural dysfunction, Pain  Visit Diagnosis: Chronic left shoulder pain  Stiffness of left shoulder, not elsewhere classified  Muscle weakness (generalized)     Problem List Patient Active Problem List   Diagnosis Date Noted  . Impingement syndrome of left shoulder   . 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 Gastroesophageal reflux disease without esophagitis 12/26/2016  . Grief at loss of child 11/09/2016  . Sternal fracture 10/06/2015  . Contusion of leg 10/06/2015  . Migraine headache 07/24/2013  . Insomnia 05/30/2013  . Fibromyalgia 05/30/2013  Janna Arch, PT, DPT    07/02/2018, 3:34 PM  [place] MAIN [facility] SERVICES [address] [place], [place], [address] Phone: [phone]   Fax:  [phone]  Name: Nichole Brown MRN: [id number] Date of Birth: 10-Dec-1966

## 2018-07-03 ENCOUNTER — Encounter: Payer: Self-pay | Admitting: Gastroenterology

## 2018-07-03 ENCOUNTER — Encounter: Payer: Self-pay | Admitting: Physical Therapy

## 2018-07-03 ENCOUNTER — Ambulatory Visit: Payer: 59 | Admitting: Family Medicine

## 2018-07-03 ENCOUNTER — Encounter: Payer: Self-pay | Admitting: Family Medicine

## 2018-07-03 ENCOUNTER — Telehealth: Payer: Self-pay | Admitting: Family Medicine

## 2018-07-03 VITALS — BP 118/74 | Ht 62.0 in | Wt 169.2 lb

## 2018-07-03 DIAGNOSIS — G43009 Migraine without aura, not intractable, without status migrainosus: Secondary | ICD-10-CM

## 2018-07-03 MED ORDER — ALPRAZOLAM 0.5 MG PO TABS: ORAL_TABLET | ORAL | 3 refills | Status: DC

## 2018-07-03 NOTE — Telephone Encounter (Signed)
Patient brought in FMLA to be updated by 8/3. Please review ,date and sign.In your yellow folder.

## 2018-07-03 NOTE — Progress Notes (Signed)
   Subjective:    Patient ID: Nichole Brown, female    DOB: 05/06/1966, 52 y.o.   MRN: [id number]  HPI Pt is here to discuss FMLA papers.   Now on monthly injec aimovig for the migraines and it has also helped the reg h a 's   Sleeping better at night  Taking less xanax for sleep   Uses maxalt rarely at this time   Pt receommended to cont her fmla in casie of recurrences   execising reg now, and handling diet better now         Review of Systems No headache, no major weight loss or weight gain, no chest pain no back pain abdominal pain no change in bowel habits complete ROS otherwise negative     Objective:   Physical Exam   Alert vitals stable, NAD. Blood pressure good on repeat. HEENT normal. Lungs clear. Heart regular rate and rhythm.      Assessment & Plan:  Impression migraine headaches.  Overall control improved.  Patient wishes to remain under FMLA status for potential absences going forward  2.  Insomnia occasional with less need for Xanax meds refilled  Follow-up in 6 months diet exercise discussed

## 2018-07-05 ENCOUNTER — Encounter: Payer: Self-pay | Admitting: Physical Therapy

## 2018-07-09 ENCOUNTER — Ambulatory Visit: Payer: 59 | Admitting: Physical Therapy

## 2018-07-10 DIAGNOSIS — M7502 Adhesive capsulitis of left shoulder: Secondary | ICD-10-CM | POA: Diagnosis not present

## 2018-07-11 ENCOUNTER — Ambulatory Visit: Payer: 59 | Admitting: Physical Therapy

## 2018-07-19 DIAGNOSIS — M19012 Primary osteoarthritis, left shoulder: Secondary | ICD-10-CM | POA: Diagnosis not present

## 2018-07-19 DIAGNOSIS — M7502 Adhesive capsulitis of left shoulder: Secondary | ICD-10-CM | POA: Diagnosis not present

## 2018-07-25 ENCOUNTER — Ambulatory Visit: Payer: 59 | Admitting: Nurse Practitioner

## 2018-07-30 ENCOUNTER — Encounter: Payer: Self-pay | Admitting: Nurse Practitioner

## 2018-07-30 ENCOUNTER — Ambulatory Visit: Payer: 59 | Admitting: Nurse Practitioner

## 2018-07-30 DIAGNOSIS — R69 Illness, unspecified: Secondary | ICD-10-CM | POA: Diagnosis not present

## 2018-07-30 DIAGNOSIS — Z01818 Encounter for other preprocedural examination: Secondary | ICD-10-CM | POA: Diagnosis not present

## 2018-07-30 NOTE — Assessment & Plan Note (Signed)
The patient is currently due for colonoscopy, first ever exam.  She is brought to the office due to polypharmacy and possible need for augmented sedation.  She denies any GI symptoms at this time.  We will proceed with colonoscopy.  Follow-up based on post procedure recommendations.  She did want [place] to be aware she has bone-on-bone issues in her left shoulder and is working with orthopedics.  She may or may not have difficulty laying on her left side for prolonged period of time.  Proceed with colonoscopy with 12.5 mg preprocedure Phenergan. With Dr. Oneida Alar in the near future. The risks, benefits, and alternatives have been discussed in detail with the patient. They state understanding and desire to proceed.   Is currently on Xanax as needed for sleep.  No other anticoagulants, anxiolytics, chronic pain medication, or antidepressants.  We will plan for the procedure with 12.5 mg preprocedure Phenergan to promote adequate sedation.

## 2018-07-30 NOTE — Progress Notes (Addendum)
REVIEWED-NO ADDITIONAL RECOMMENDATIONS.  Primary Care Physician:  Mikey Kirschner, MD Primary Gastroenterologist:  Dr. Oneida Alar  Chief Complaint  Patient presents with  . Consult    TCS. Has not had one prior     HPI:   Nichole Brown is a 52 y.o. female who presents on referral for first ever screening colonoscopy.  Nurse/phone triage was deferred to office visit due to medications.  Today she states she is doing well overall.  Denies abdominal pain, nausea, vomiting, hematochezia, melena, fever, chills, unintentional weight loss.  Denies chest pain, dyspnea, dizziness, lightheadedness, syncope, near syncope. Denies any other upper or lower GI symptoms.  She was previously on hydrocodone but is not on anymore.  She takes Xanax occasionally, not every night.  She does not have CPAP.  She does have left shoulder pain which was found to be bone-on-bone and she is concerned she may have difficulty laying on her left side.  Past Medical History:  Diagnosis Date  . Anxiety   . Bronchitis   . Depression   . Fibromyalgia 2005  . GERD (gastroesophageal reflux disease)   . Insomnia   . Migraine headache   . Pneumonia     Past Surgical History:  Procedure Laterality Date  . ABDOMINAL HYSTERECTOMY    . CESAREAN SECTION    . CHOLECYSTECTOMY  1988  . open heart surgery  1967  . PATENT DUCTUS ARTERIOUS REPAIR  1967  . SHOULDER ARTHROSCOPY Left 06/16/2017   Procedure: LEFT SHOULDER ARTHROSCOPY, DEBRIDEMENT, AND DECOMPRESSION;  Surgeon: Newt Minion, MD;  Location: [facility];  Service: Orthopedics;  Laterality: Left;  . SHOULDER SURGERY Left 2008   [facility] joint    Current Outpatient Medications  Medication Sig Dispense Refill  . ALPRAZolam (XANAX) 0.5 MG tablet TAKE ONE TABLET AT BEDTIME AS NEEDED FOR SLEEP 30 tablet 3  . Erenumab-aooe (AIMOVIG) 70 MG/ML SOAJ Inject into the skin. Once a month    . Lifitegrast (XIIDRA) 5 % SOLN Place 1 drop into both eyes 2 (two) times daily.    .  naproxen (NAPROSYN) 500 MG tablet TAKE ONE TABLET BY MOUTH TWICE A DAY WITH A MEAL AS NEEDED FOR HEADACHE 30 tablet 0  . ondansetron (ZOFRAN-ODT) 4 MG disintegrating tablet Take 1 tablet (4 mg total) by mouth every 8 (eight) hours as needed for nausea or vomiting. 20 tablet 0  . pantoprazole (PROTONIX) 40 MG tablet Take 1 tablet (40 mg total) by mouth daily. For acid reflux (Patient taking differently: Take 40 mg by mouth daily as needed. For acid reflux) 90 tablet 0  . rizatriptan (MAXALT-MLT) 10 MG disintegrating tablet Take 10 mg by mouth as needed for migraine. May repeat in 2 hours if needed    . Vitamin D, Ergocalciferol, (DRISDOL) 50000 units CAPS capsule Take 1 capsule (50,000 Units total) by mouth every 7 (seven) days. 12 capsule 0   No current facility-administered medications for this visit.     Allergies as of 07/30/2018 - Review Complete 07/30/2018  Allergen Reaction Noted  . Cymbalta [duloxetine hcl] Other (See Comments) 02/09/2017  . Nortriptyline Nausea And Vomiting 02/09/2017    Family History  Problem Relation Age of Onset  . Hypertension Maternal Grandmother   . Diabetes Maternal Grandmother   . Heart disease Maternal Grandmother 40       MI  . Hypertension Maternal Grandfather   . Throat cancer Maternal Grandfather   . Breast cancer Mother 24  . Hypertension Father  Social History   Socioeconomic History  . Marital status: Married    Spouse name: Not on file  . Number of children: Not on file  . Years of education: Not on file  . Highest education level: Not on file  Occupational History  . Not on file  Social Needs  . Financial resource strain: Not on file  . Food insecurity:    Worry: Not on file    Inability: Not on file  . Transportation needs:    Medical: Not on file    Non-medical: Not on file  Tobacco Use  . Smoking status: Former Research scientist (life sciences)  . Smokeless tobacco: Never Used  Substance and Sexual Activity  . Alcohol use: Yes    Comment:  occassionally  . Drug use: No  . Sexual activity: Yes    Birth control/protection: Surgical  Lifestyle  . Physical activity:    Days per week: Not on file    Minutes per session: Not on file  . Stress: Not on file  Relationships  . Social connections:    Talks on phone: Not on file    Gets together: Not on file    Attends religious service: Not on file    Active member of club or organization: Not on file    Attends meetings of clubs or organizations: Not on file    Relationship status: Not on file  . Intimate partner violence:    Fear of current or ex partner: Not on file    Emotionally abused: Not on file    Physically abused: Not on file    Forced sexual activity: Not on file  Other Topics Concern  . Not on file  Social History Narrative  . Not on file    Review of Systems: Complete ROS negative except as per HPI.    Physical Exam: BP 101/69   Pulse 85   Temp (!) 97.4 F (36.3 C) (Oral)   Ht 5\' 2"  (1.575 m)   Wt 170 lb (77.1 kg)   BMI 31.09 kg/m  General:   Alert and oriented. Pleasant and cooperative. Well-nourished and well-developed.  Head:  Normocephalic and atraumatic. Eyes:  Without icterus, sclera clear and conjunctiva pink.  Ears:  Normal auditory acuity. Cardiovascular:  S1, S2 present without murmurs appreciated. Extremities without clubbing or edema. Respiratory:  Clear to auscultation bilaterally. No wheezes, rales, or rhonchi. No distress.  Gastrointestinal:  +BS, soft, non-tender and non-distended. No HSM noted. No guarding or rebound. No masses appreciated.  Rectal:  Deferred  Musculoskalatal:  Symmetrical without gross deformities. Neurologic:  Alert and oriented x4;  grossly normal neurologically. Psych:  Alert and cooperative. Normal mood and affect. Heme/Lymph/Immune: No excessive bruising noted.    07/30/2018 2:29 PM   Disclaimer: This note was dictated with voice recognition software. Similar sounding words can inadvertently be  transcribed and may not be corrected upon review.

## 2018-07-30 NOTE — Patient Instructions (Signed)
1. We will schedule your colonoscopy for you. 2. Further recommendations will be made after colonoscopy. 3. Return for follow-up based on recommendations made after your colonoscopy. 4. Call us if you have any questions or concerns.  At [facility] we value your feedback. You may receive a survey about your visit today. Please share your experience as we strive to create trusting relationships with our patients to provide genuine, compassionate, quality care.  It was great to meet you today!  I hope you have a great summer!!

## 2018-07-31 ENCOUNTER — Other Ambulatory Visit: Payer: Self-pay

## 2018-07-31 ENCOUNTER — Telehealth: Payer: Self-pay

## 2018-07-31 DIAGNOSIS — Z1211 Encounter for screening for malignant neoplasm of colon: Secondary | ICD-10-CM

## 2018-07-31 MED ORDER — PEG 3350-KCL-NA BICARB-NACL 420 G PO SOLR: 4000.0000 mL | ORAL | 0 refills | Status: DC

## 2018-07-31 NOTE — Telephone Encounter (Signed)
Nichole Brown at [facility] called office, no PA needed for TCS.

## 2018-07-31 NOTE — Telephone Encounter (Signed)
Called pt, TCS w/SLF scheduled for 10/05/18 at 10:30am. Rx for prep sent to pharmacy. Orders entered. Instructions mailed.   PA for TCS submitted via [facility] website.

## 2018-08-07 ENCOUNTER — Encounter: Payer: Self-pay | Admitting: Family Medicine

## 2018-08-07 ENCOUNTER — Ambulatory Visit: Payer: 59 | Admitting: Family Medicine

## 2018-08-07 VITALS — BP 112/78 | HR 76 | Temp 98.6°F | Ht 62.0 in | Wt 169.1 lb

## 2018-08-07 DIAGNOSIS — J4521 Mild intermittent asthma with (acute) exacerbation: Secondary | ICD-10-CM | POA: Diagnosis not present

## 2018-08-07 DIAGNOSIS — J019 Acute sinusitis, unspecified: Secondary | ICD-10-CM

## 2018-08-07 MED ORDER — AZITHROMYCIN 250 MG PO TABS: ORAL_TABLET | ORAL | 0 refills | Status: DC

## 2018-08-07 MED ORDER — ALBUTEROL SULFATE HFA 108 (90 BASE) MCG/ACT IN AERS: 2.0000 | INHALATION_SPRAY | Freq: Four times a day (QID) | RESPIRATORY_TRACT | 2 refills | Status: DC | PRN

## 2018-08-07 MED ORDER — PREDNISONE 20 MG PO TABS: ORAL_TABLET | ORAL | 0 refills | Status: DC

## 2018-08-07 NOTE — Progress Notes (Signed)
   Subjective:    Patient ID: Nichole Brown, female    DOB: [birth year], 52 y.o.   MRN: [id number]  HPI  Patient is here today with complaints of a bad cough and can not catch a deep breath, runny nose, Right ear pain, on going for a week. Breathing troubles started Sunday.Has not taken any meds for this other than Tylenol. Patient with intermittent head congestion drainage coughing has difficult time taking a deep breath.  Denies high fever chills denies wheezing difficulty breathing. signif congestion in head and chest Hard to take deep breasth Coughs with deep breath   Review of Systems  Constitutional: Negative for activity change and fever.  HENT: Positive for congestion and rhinorrhea. Negative for ear pain.   Eyes: Negative for discharge.  Respiratory: Positive for cough and shortness of breath. Negative for wheezing.   Cardiovascular: Negative for chest pain.       Objective:   Physical Exam  Constitutional: She appears well-developed.  HENT:  Head: Normocephalic.  Nose: Nose normal.  Mouth/Throat: Oropharynx is clear and moist. No oropharyngeal exudate.  Neck: Neck supple.  Cardiovascular: Normal rate and normal heart sounds.  No murmur heard. Pulmonary/Chest: Effort normal and breath sounds normal. She has no wheezes.  Lymphadenopathy:    She has no cervical adenopathy.  Skin: Skin is warm and dry.  Nursing note and vitals reviewed.   Patient does not seem to be in respiratory distress.      Assessment & Plan:  Viral syndrome Secondary rhinosinusitis Bronchial component Asthma flareup Continue current medications Prednisone taper Antibiotics prescribed I do not feel chest x-ray lab work is indicated currently If worse we will do testing Follow-up if problems I doubt that this is a pulmonary embolism

## 2018-09-03 DIAGNOSIS — M25512 Pain in left shoulder: Secondary | ICD-10-CM | POA: Diagnosis not present

## 2018-09-18 ENCOUNTER — Telehealth: Payer: Self-pay

## 2018-09-18 NOTE — Telephone Encounter (Signed)
Pt called office, requested to reschedule TCS w/SLF that was for 10/05/18. TCS rescheduled to 11/16/18 at 2:00pm. New instructions mailed. Endo scheduler aware.

## 2018-10-08 ENCOUNTER — Telehealth: Payer: Self-pay

## 2018-10-08 NOTE — Telephone Encounter (Signed)
Pt called office to reschedule TCS that was for 11/16/18 d/t she has another appt that day. TCS rescheduled to 12/10/18 at 9:45am. New instructions mailed. LMOVM for endo scheduler.

## 2018-10-10 ENCOUNTER — Ambulatory Visit: Payer: 59 | Admitting: Family Medicine

## 2018-10-10 ENCOUNTER — Ambulatory Visit (HOSPITAL_COMMUNITY)
Admission: RE | Admit: 2018-10-10 | Discharge: 2018-10-10 | Disposition: A | Discharge status: Home / Self Care | Payer: 59 | Source: Ambulatory Visit | Attending: Family Medicine | Admitting: Family Medicine

## 2018-10-10 ENCOUNTER — Encounter: Payer: Self-pay | Admitting: Family Medicine

## 2018-10-10 VITALS — BP 98/62 | Temp 98.5°F | Ht 62.0 in | Wt 170.1 lb

## 2018-10-10 DIAGNOSIS — G43009 Migraine without aura, not intractable, without status migrainosus: Secondary | ICD-10-CM

## 2018-10-10 DIAGNOSIS — J40 Bronchitis, not specified as acute or chronic: Secondary | ICD-10-CM | POA: Diagnosis not present

## 2018-10-10 DIAGNOSIS — R0600 Dyspnea, unspecified: Secondary | ICD-10-CM | POA: Diagnosis not present

## 2018-10-10 MED ORDER — TIZANIDINE HCL 4 MG PO TABS: ORAL_TABLET | ORAL | 2 refills | Status: DC

## 2018-10-10 NOTE — Progress Notes (Signed)
   Subjective:    Patient ID: Nichole Brown, female    DOB: 07/20/1966, 52 y.o.   MRN: [id number]  HPI Patient is here today with complaints of a headache.She says migraine started on Sunday and she had a full blown migraine from Sunday to Tuesday.    She says she took Maxalt 10 mg and it did not seem to help.  Pt worried about the aimovig injections    Worried that ist is causing weight  Has appt with specilis on dec 6  Pt having trouble exercising, exercising more '  Pa looked at pts shots and said there was no effect on  Patient noting progressive dyspnea.  Very concerning to her.  Occurring more more with minimal exertion.  Wondered it if it was related to injections from migraines now not so.  Patient does not smoke.  No history of asthma.  No obvious wheezing.   Review of Systems No headache, no major weight loss or weight gain, no chest pain no back pain abdominal pain no change in bowel habits complete ROS otherwise negative     Objective:   Physical Exam  Alert and oriented, vitals reviewed and stable, NAD ENT-TM's and ext canals WNL bilat via otoscopic exam Soft palate, tonsils and post pharynx WNL via oropharyngeal exam Neck-symmetric, no masses; thyroid nonpalpable and nontender Pulmonary-no tachypnea or accessory muscle use; Clear without wheezes via auscultation Card--no abnrml murmurs, rhythm reg and rate WNL Carotid pulses symmetric, without bruits       Assessment & Plan:  Impression progressive dyspnea.  Very concerning to patient.  Will probably pulmonary function tests and chest x-ray.  Also emotional demise.  Discussed we will need to continue treating her specialist this regard.  Multiple questions answered.  Further recommendations based on results.  Greater than 50% of this 25 minute face to face visit was spent in counseling and discussion and coordination of care regarding the above diagnosis/diagnosies

## 2018-10-11 ENCOUNTER — Other Ambulatory Visit: Payer: Self-pay | Admitting: *Deleted

## 2018-10-11 DIAGNOSIS — R9389 Abnormal findings on diagnostic imaging of other specified body structures: Secondary | ICD-10-CM

## 2018-10-11 DIAGNOSIS — R0602 Shortness of breath: Secondary | ICD-10-CM

## 2018-10-15 DIAGNOSIS — H52223 Regular astigmatism, bilateral: Secondary | ICD-10-CM | POA: Diagnosis not present

## 2018-10-15 DIAGNOSIS — H524 Presbyopia: Secondary | ICD-10-CM | POA: Diagnosis not present

## 2018-10-15 DIAGNOSIS — H5213 Myopia, bilateral: Secondary | ICD-10-CM | POA: Diagnosis not present

## 2018-10-15 DIAGNOSIS — H04123 Dry eye syndrome of bilateral lacrimal glands: Secondary | ICD-10-CM | POA: Diagnosis not present

## 2018-10-17 ENCOUNTER — Encounter: Payer: Self-pay | Admitting: Family Medicine

## 2018-10-25 ENCOUNTER — Ambulatory Visit (INDEPENDENT_AMBULATORY_CARE_PROVIDER_SITE_OTHER): Payer: 59 | Admitting: Pulmonary Disease

## 2018-10-25 ENCOUNTER — Other Ambulatory Visit (INDEPENDENT_AMBULATORY_CARE_PROVIDER_SITE_OTHER): Payer: 59

## 2018-10-25 ENCOUNTER — Encounter: Payer: Self-pay | Admitting: Pulmonary Disease

## 2018-10-25 VITALS — BP 102/82 | HR 78 | Ht 62.0 in | Wt 169.0 lb

## 2018-10-25 DIAGNOSIS — M2550 Pain in unspecified joint: Secondary | ICD-10-CM | POA: Diagnosis not present

## 2018-10-25 DIAGNOSIS — R9389 Abnormal findings on diagnostic imaging of other specified body structures: Secondary | ICD-10-CM | POA: Diagnosis not present

## 2018-10-25 LAB — C-REACTIVE PROTEIN: CRP: 0.2 mg/dL — ABNORMAL LOW (ref 0.5–20.0)

## 2018-10-25 LAB — SEDIMENTATION RATE: SED RATE: 8 mm/h (ref 0–30)

## 2018-10-25 NOTE — Progress Notes (Signed)
Nichole Brown    [id number]    29-Nov-1966  Primary Care Physician:Luking, Grace Bushy, MD  Referring Physician: Mikey Kirschner, [place], [place] [address]  Chief complaint:   Shortness of breath  HPI:  Patient with shortness of breath more noticeable recently Recently started antimigraine medications-erenumab which she associated with the onset of symptoms  She has no shortness of breath at rest No history of asthma Reformed social smoker  No family history of lung disease No chronic cough  She does have some musculoskeletal pains and discomfort   Outpatient Encounter Medications as of 10/25/2018  Medication Sig  . albuterol (PROVENTIL HFA;VENTOLIN HFA) 108 (90 Base) MCG/ACT inhaler Inhale 2 puffs into the lungs every 6 (six) hours as needed for wheezing.  Eduard Roux (AIMOVIG) 70 MG/ML SOAJ Inject into the skin. Once a month  . ondansetron (ZOFRAN-ODT) 4 MG disintegrating tablet Take 1 tablet (4 mg total) by mouth every 8 (eight) hours as needed for nausea or vomiting.  . pantoprazole (PROTONIX) 40 MG tablet Take 1 tablet (40 mg total) by mouth daily. For acid reflux (Patient taking differently: Take 40 mg by mouth daily as needed. For acid reflux)  . rizatriptan (MAXALT-MLT) 10 MG disintegrating tablet Take 10 mg by mouth as needed for migraine. May repeat in 2 hours if needed  . tiZANidine (ZANAFLEX) 4 MG tablet Take one TID prn spasm.  . [DISCONTINUED] polyethylene glycol-electrolytes (TRILYTE) 420 g solution Take 4,000 mLs by mouth as directed.   No facility-administered encounter medications on file as of 10/25/2018.     Allergies as of 10/25/2018 - Review Complete 10/10/2018  Allergen Reaction Noted  . Cymbalta [duloxetine hcl] Other (See Comments) 02/09/2017  . Nortriptyline Nausea And Vomiting 02/09/2017    Past Medical History:  Diagnosis Date  . Anxiety   . Bronchitis   . Depression   . Fibromyalgia 2005  .  GERD (gastroesophageal reflux disease)   . Insomnia   . Migraine headache   . Pneumonia     Past Surgical History:  Procedure Laterality Date  . ABDOMINAL HYSTERECTOMY    . CESAREAN SECTION    . CHOLECYSTECTOMY  1988  . open heart surgery  1967  . PATENT DUCTUS ARTERIOUS REPAIR  1967  . SHOULDER ARTHROSCOPY Left 06/16/2017   Procedure: LEFT SHOULDER ARTHROSCOPY, DEBRIDEMENT, AND DECOMPRESSION;  Surgeon: Newt Minion, MD;  Location: [place];  Service: Orthopedics;  Laterality: Left;  . SHOULDER SURGERY Left 2008   [facility] joint    Family History  Problem Relation Age of Onset  . Hypertension Maternal Grandmother   . Diabetes Maternal Grandmother   . Heart disease Maternal Grandmother 40       MI  . Hypertension Maternal Grandfather   . Throat cancer Maternal Grandfather   . Breast cancer Mother 24  . Hypertension Father     Social History   Socioeconomic History  . Marital status: Married    Spouse name: Not on file  . Number of children: Not on file  . Years of education: Not on file  . Highest education level: Not on file  Occupational History  . Not on file  Social Needs  . Financial resource strain: Not on file  . Food insecurity:    Worry: Not on file    Inability: Not on file  . Transportation needs:    Medical: Not on file    Non-medical: Not on  file  Tobacco Use  . Smoking status: Former Research scientist (life sciences)  . Smokeless tobacco: Never Used  Substance and Sexual Activity  . Alcohol use: Yes    Comment: occassionally  . Drug use: No  . Sexual activity: Yes    Birth control/protection: Surgical  Lifestyle  . Physical activity:    Days per week: Not on file    Minutes per session: Not on file  . Stress: Not on file  Relationships  . Social connections:    Talks on phone: Not on file    Gets together: Not on file    Attends religious service: Not on file    Active member of club or organization: Not on file    Attends meetings of clubs or organizations: Not on file      Relationship status: Not on file  . Intimate partner violence:    Fear of current or ex partner: Not on file    Emotionally abused: Not on file    Physically abused: Not on file    Forced sexual activity: Not on file  Other Topics Concern  . Not on file  Social History Narrative  . Not on file    Review of Systems  Constitutional: Positive for fatigue. Negative for appetite change and chills.  Eyes: Negative.   Respiratory: Positive for shortness of breath. Negative for cough.   Cardiovascular: Negative.   Gastrointestinal: Negative.   Endocrine: Negative.   Genitourinary: Negative.   Neurological: Positive for headaches.  No night sweats  Vitals:   10/25/18 1602  BP: 102/82  Pulse: 78  SpO2: 98%     Physical Exam  Constitutional: She appears well-developed.  HENT:  Head: Normocephalic and atraumatic.  Eyes: Pupils are equal, round, and reactive to light. Right eye exhibits no discharge. Left eye exhibits no discharge.  Neck: Normal range of motion. Neck supple. No tracheal deviation present. No thyromegaly present.  Cardiovascular: Normal rate and regular rhythm.  Pulmonary/Chest: Effort normal and breath sounds normal. No respiratory distress. She has no wheezes.  Abdominal: Soft. Bowel sounds are normal. She exhibits no distension. There is no tenderness.   Data Reviewed: Chest x-ray reviewed by myself showing increased markings, no clear-cut infiltrate  Assessment:  Shortness of breath-only with significant exertion with no underlying lung disease known to patient, was on antibiotics about 4 months ago for a respiratory infection  Muscular skeletal pain and discomfort  She has no underlying lung disease known to her  Plan/Recommendations: Follow-up on pulmonary function study-already ordered  Follow-up on repeat chest x-ray in 4 to 6 weeks -to follow the markings to resolution  Obtain ESR, ANA, CRP-markers of inflammation--further evaluation depending  on results  Patient has no   Sherrilyn Rist MD [facility] 10/25/2018, 4:09 PM  CC: Mikey Kirschner, MD

## 2018-10-25 NOTE — Patient Instructions (Signed)
Shortness of breath Increased interstitial markings on chest x-ray  Repeat chest x-ray in about 4 to 6 weeks Follow-up on the pulmonary function study already ordered for the 20th  ESR CRP ANA For further evaluation of musculoskeletal and joint pains  I will see you back in the office in about 2 months

## 2018-10-29 LAB — ANA: Anti Nuclear Antibody(ANA): POSITIVE — AB

## 2018-10-29 LAB — ANTI-NUCLEAR AB-TITER (ANA TITER): ANA Titer 1: 1:160 {titer} — ABNORMAL HIGH

## 2018-10-31 ENCOUNTER — Telehealth: Payer: Self-pay | Admitting: Pulmonary Disease

## 2018-10-31 ENCOUNTER — Ambulatory Visit (HOSPITAL_COMMUNITY)
Admission: RE | Admit: 2018-10-31 | Discharge: 2018-10-31 | Disposition: A | Discharge status: Home / Self Care | Payer: 59 | Source: Ambulatory Visit | Attending: Family Medicine | Admitting: Family Medicine

## 2018-10-31 DIAGNOSIS — R0600 Dyspnea, unspecified: Secondary | ICD-10-CM | POA: Insufficient documentation

## 2018-10-31 DIAGNOSIS — R768 Other specified abnormal immunological findings in serum: Secondary | ICD-10-CM

## 2018-10-31 LAB — PULMONARY FUNCTION TEST
DL/VA % pred: 90 %
DL/VA: 4.08 ml/min/mmHg/L
DLCO UNC % PRED: 96 %
DLCO unc: 20.88 ml/min/mmHg
FEF 25-75 Post: 4 L/sec
FEF 25-75 Pre: 3.49 L/sec
FEF2575-%CHANGE-POST: 14 %
FEF2575-%PRED-POST: 160 %
FEF2575-%Pred-Pre: 140 %
FEV1-%CHANGE-POST: 6 %
FEV1-%Pred-Post: 112 %
FEV1-%Pred-Pre: 105 %
FEV1-POST: 2.84 L
FEV1-PRE: 2.67 L
FEV1FVC-%CHANGE-POST: 1 %
FEV1FVC-%PRED-PRE: 108 %
FEV6-%Change-Post: 4 %
FEV6-%Pred-Post: 104 %
FEV6-%Pred-Pre: 99 %
FEV6-PRE: 3.1 L
FEV6-Post: 3.24 L
FEV6FVC-%Pred-Post: 103 %
FEV6FVC-%Pred-Pre: 103 %
FVC-%CHANGE-POST: 4 %
FVC-%PRED-POST: 100 %
FVC-%PRED-PRE: 96 %
FVC-POST: 3.24 L
FVC-PRE: 3.1 L
POST FEV1/FVC RATIO: 88 %
Post FEV6/FVC ratio: 100 %
Pre FEV1/FVC ratio: 86 %
Pre FEV6/FVC Ratio: 100 %
RV % PRED: 127 %
RV: 2.26 L
TLC % pred: 118 %
TLC: 5.63 L

## 2018-10-31 MED ORDER — ALBUTEROL SULFATE (2.5 MG/3ML) 0.083% IN NEBU
2.5000 mg | INHALATION_SOLUTION | Freq: Once | RESPIRATORY_TRACT | Status: AC
  Administered 2018-10-31: 2.5 mg via RESPIRATORY_TRACT

## 2018-10-31 NOTE — Telephone Encounter (Signed)
Called and spoke to patient, made aware of lab results. Ok with referral to Rhematology referral. Order placed. Voiced understanding. Nothing further is needed at this time.

## 2018-11-16 DIAGNOSIS — G43709 Chronic migraine without aura, not intractable, without status migrainosus: Secondary | ICD-10-CM | POA: Diagnosis not present

## 2018-12-03 ENCOUNTER — Telehealth: Payer: Self-pay | Admitting: Family Medicine

## 2018-12-03 NOTE — Telephone Encounter (Signed)
Pt would like a phone call to discuss when her FMLA would be due for renewal.

## 2018-12-10 ENCOUNTER — Encounter (HOSPITAL_COMMUNITY): Payer: Self-pay

## 2018-12-10 ENCOUNTER — Ambulatory Visit (HOSPITAL_COMMUNITY)
Admission: RE | Admit: 2018-12-10 | Discharge: 2018-12-10 | Disposition: A | Discharge status: Home / Self Care | Payer: 59 | Attending: Gastroenterology | Admitting: Gastroenterology

## 2018-12-10 ENCOUNTER — Encounter (HOSPITAL_COMMUNITY)
Admission: RE | Disposition: A | Discharge status: Home / Self Care | Payer: Self-pay | Source: Home / Self Care | Attending: Gastroenterology

## 2018-12-10 ENCOUNTER — Other Ambulatory Visit: Payer: Self-pay

## 2018-12-10 DIAGNOSIS — F329 Major depressive disorder, single episode, unspecified: Secondary | ICD-10-CM | POA: Diagnosis not present

## 2018-12-10 DIAGNOSIS — D123 Benign neoplasm of transverse colon: Secondary | ICD-10-CM | POA: Diagnosis not present

## 2018-12-10 DIAGNOSIS — Z1211 Encounter for screening for malignant neoplasm of colon: Secondary | ICD-10-CM | POA: Insufficient documentation

## 2018-12-10 DIAGNOSIS — K644 Residual hemorrhoidal skin tags: Secondary | ICD-10-CM | POA: Diagnosis not present

## 2018-12-10 DIAGNOSIS — K5730 Diverticulosis of large intestine without perforation or abscess without bleeding: Secondary | ICD-10-CM | POA: Diagnosis not present

## 2018-12-10 DIAGNOSIS — Z79899 Other long term (current) drug therapy: Secondary | ICD-10-CM | POA: Insufficient documentation

## 2018-12-10 DIAGNOSIS — Z87891 Personal history of nicotine dependence: Secondary | ICD-10-CM | POA: Diagnosis not present

## 2018-12-10 DIAGNOSIS — G4700 Insomnia, unspecified: Secondary | ICD-10-CM | POA: Diagnosis not present

## 2018-12-10 DIAGNOSIS — K219 Gastro-esophageal reflux disease without esophagitis: Secondary | ICD-10-CM | POA: Diagnosis not present

## 2018-12-10 DIAGNOSIS — F419 Anxiety disorder, unspecified: Secondary | ICD-10-CM | POA: Diagnosis not present

## 2018-12-10 DIAGNOSIS — K635 Polyp of colon: Secondary | ICD-10-CM | POA: Diagnosis not present

## 2018-12-10 DIAGNOSIS — D124 Benign neoplasm of descending colon: Secondary | ICD-10-CM | POA: Diagnosis not present

## 2018-12-10 DIAGNOSIS — M797 Fibromyalgia: Secondary | ICD-10-CM | POA: Insufficient documentation

## 2018-12-10 DIAGNOSIS — K648 Other hemorrhoids: Secondary | ICD-10-CM | POA: Insufficient documentation

## 2018-12-10 DIAGNOSIS — Z0000 Encounter for general adult medical examination without abnormal findings: Secondary | ICD-10-CM

## 2018-12-10 HISTORY — PX: COLONOSCOPY: SHX5424

## 2018-12-10 HISTORY — PX: POLYPECTOMY: SHX5525

## 2018-12-10 SURGERY — COLONOSCOPY
Anesthesia: Moderate Sedation

## 2018-12-10 MED ORDER — SODIUM CHLORIDE 0.9% FLUSH
INTRAVENOUS | Status: AC
  Administered 2018-12-10: 10 mL
  Filled 2018-12-10: qty 10

## 2018-12-10 MED ORDER — MIDAZOLAM HCL 5 MG/5ML IJ SOLN
INTRAMUSCULAR | Status: DC | PRN
  Administered 2018-12-10: 2 mg via INTRAVENOUS
  Administered 2018-12-10 (×2): 1 mg via INTRAVENOUS

## 2018-12-10 MED ORDER — SODIUM CHLORIDE 0.9 % IV SOLN
INTRAVENOUS | Status: DC
  Administered 2018-12-10: 09:00:00 via INTRAVENOUS

## 2018-12-10 MED ORDER — MEPERIDINE HCL 100 MG/ML IJ SOLN
INTRAMUSCULAR | Status: DC | PRN
  Administered 2018-12-10 (×2): 25 mg via INTRAVENOUS

## 2018-12-10 MED ORDER — PROMETHAZINE HCL 25 MG/ML IJ SOLN
INTRAMUSCULAR | Status: AC
  Administered 2018-12-10: 12.5 mg
  Filled 2018-12-10: qty 1

## 2018-12-10 MED ORDER — MEPERIDINE HCL 100 MG/ML IJ SOLN
INTRAMUSCULAR | Status: AC
  Filled 2018-12-10: qty 2

## 2018-12-10 MED ORDER — PROMETHAZINE HCL 25 MG/ML IJ SOLN: 12.5000 mg | Freq: Once | INTRAMUSCULAR | Status: DC

## 2018-12-10 MED ORDER — MIDAZOLAM HCL 5 MG/5ML IJ SOLN
INTRAMUSCULAR | Status: AC
  Filled 2018-12-10: qty 10

## 2018-12-10 MED ORDER — STERILE WATER FOR IRRIGATION IR SOLN
Status: DC | PRN
  Administered 2018-12-10: 1.5 mL

## 2018-12-10 NOTE — Progress Notes (Signed)
Nichole Brown cannot drive, operate machinery, sign legal documents until after 10 am on Tuesday December 11, 2018.

## 2018-12-10 NOTE — Op Note (Signed)
[facility] Patient Name: Nichole Brown Procedure Date: 12/10/2018 9:06 AM MRN: [id number] Date of Birth: Oct 29, 1966 Attending MD: Barney Drain MD, MD CSN: [id number] Age: 52 Admit Type: Outpatient Procedure:                Colonoscopy WITH COLD SNARE POLYPECTOMY Indications:              Screening for colorectal malignant neoplasm Providers:                Barney Drain MD, MD, Lurline Del, RN, Aram Candela Referring MD:             Rosemary Holms, MD Medicines:                Promethazine 12.5 mg IV, Meperidine 50 mg IV,                            Midazolam 4 mg IV Complications:            No immediate complications. Estimated Blood Loss:     Estimated blood loss was minimal. Procedure:                Pre-Anesthesia Assessment:                           - Prior to the procedure, a History and Physical                            was performed, and patient medications and                            allergies were reviewed. The patient's tolerance of                            previous anesthesia was also reviewed. The risks                            and benefits of the procedure and the sedation                            options and risks were discussed with the patient.                            All questions were answered, and informed consent                            was obtained. Prior Anticoagulants: The patient has                            taken no previous anticoagulant or antiplatelet                            agents. ASA Grade Assessment: II - A patient with                            mild systemic disease. After reviewing the risks  and benefits, the patient was deemed in                            satisfactory condition to undergo the procedure.                            After obtaining informed consent, the colonoscope                            was passed under direct vision. Throughout the                            procedure, the  patient's blood pressure, pulse, and                            oxygen saturations were monitored continuously. The                            [id number]-[id number] ([id number]) scope was introduced through                            the anus and advanced to the the cecum, identified                            by appendiceal orifice and ileocecal valve. The                            colonoscopy was somewhat difficult due to a                            tortuous colon. Successful completion of the                            procedure was aided by increasing the dose of                            sedation medication, straightening and shortening                            the scope to obtain bowel loop reduction and                            COLOWRAP. The patient tolerated the procedure                            fairly well. The quality of the bowel preparation                            was excellent. The ileocecal valve, appendiceal                            orifice, and rectum were photographed. Scope In: 9:45:09 AM Scope Out: 10:07:59 AM Scope Withdrawal Time: 0 hours 19 minutes 48 seconds  Total Procedure  Duration: 0 hours 22 minutes 50 seconds  Findings:      Six sessile polyps were found in the proximal descending colon, mid       transverse colon and hepatic flexure(4: BTL 1). The polyps were 4 to 8       mm in size. These polyps were removed with a cold snare. Resection and       retrieval were complete.      Multiple small and large-mouthed diverticula were found in the       recto-sigmoid colon, sigmoid colon, descending colon, transverse colon,       hepatic flexure and ascending colon.      External and internal hemorrhoids were found.      The recto-sigmoid colon, sigmoid colon and descending colon were mildly       tortuous. Impression:               - Six 4 to 8 mm polyps in the proximal descending                            colon, in the mid transverse colon and at the                             hepatic flexure, removed with a cold snare.                            Resected and retrieved.                           - Diverticulosis in the recto-sigmoid colon, in the                            sigmoid colon, in the descending colon, in the                            transverse colon, at the hepatic flexure and in the                            ascending colon.                           - External and internal hemorrhoids.                           - Tortuous colon. Moderate Sedation:      Moderate (conscious) sedation was administered by the endoscopy nurse       and supervised by the endoscopist. The following parameters were       monitored: oxygen saturation, heart rate, blood pressure, and response       to care. Total physician intraservice time was 32 minutes. Recommendation:           - Patient has a contact number available for                            emergencies. The signs and symptoms of potential  delayed complications were discussed with the                            patient. Return to normal activities tomorrow.                            Written discharge instructions were provided to the                            patient.                           - High fiber diet.                           - Continue present medications.                           - Await pathology results.                           - Repeat colonoscopy in 3 years for surveillance. Procedure Code(s):        --- Professional ---                           [phone], Colonoscopy, flexible; with removal of                            tumor(s), polyp(s), or other lesion(s) by snare                            technique                           99153, Moderate sedation; each additional 15                            minutes intraservice time                           G0500, Moderate sedation services provided by the                            same physician or  other qualified health care                            professional performing a gastrointestinal                            endoscopic service that sedation supports,                            requiring the presence of an independent trained                            observer to assist in the monitoring of the  patient's level of consciousness and physiological                            status; initial 15 minutes of intra-service time;                            patient age 17 years or older (additional time may                            be reported with [phone], as appropriate) Diagnosis Code(s):        --- Professional ---                           Z12.11, Encounter for screening for malignant                            neoplasm of colon                           D12.4, Benign neoplasm of descending colon                           D12.3, Benign neoplasm of transverse colon (hepatic                            flexure or splenic flexure)                           K64.8, Other hemorrhoids                           K57.30, Diverticulosis of large intestine without                            perforation or abscess without bleeding                           Q43.8, Other specified congenital malformations of                            intestine CPT copyright 2018 [employer] Medical Association. All rights reserved. The codes documented in this report are preliminary and upon coder review may  be revised to meet current compliance requirements. Barney Drain, MD Barney Drain MD, MD 12/10/2018 10:23:41 AM This report has been signed electronically. Number of Addenda: 0

## 2018-12-10 NOTE — H&P (Signed)
Primary Care Physician:  Mikey Kirschner, MD Primary Gastroenterologist:  Dr. Oneida Alar  Pre-Procedure History & Physical: HPI:  Nichole Brown is a 52 y.o. female here for COLON CANCER SCREENING.  Past Medical History:  Diagnosis Date  . Anxiety   . Bronchitis   . Depression   . Fibromyalgia 2005  . GERD (gastroesophageal reflux disease)   . Insomnia   . Migraine headache   . Pneumonia     Past Surgical History:  Procedure Laterality Date  . ABDOMINAL HYSTERECTOMY    . CESAREAN SECTION    . CHOLECYSTECTOMY  1988  . open heart surgery  1967  . PATENT DUCTUS ARTERIOUS REPAIR  1967  . SHOULDER ARTHROSCOPY Left 06/16/2017   Procedure: LEFT SHOULDER ARTHROSCOPY, DEBRIDEMENT, AND DECOMPRESSION;  Surgeon: Newt Minion, MD;  Location: [place];  Service: Orthopedics;  Laterality: Left;  . SHOULDER SURGERY Left 2008   AC joint    Prior to Admission medications   Medication Sig Start Date End Date Taking? Authorizing Provider  Erenumab-aooe (AIMOVIG) 70 MG/ML SOAJ Inject 70 mg into the skin. Once a month 03/23/18  Yes [provider]  ondansetron (ZOFRAN-ODT) 4 MG disintegrating tablet Take 1 tablet (4 mg total) by mouth every 8 (eight) hours as needed for nausea or vomiting. 06/12/17  Yes Cuthriell, Charline Bills, PA-C  Propylene Glycol (SYSTANE BALANCE) 0.6 % SOLN Place 1 drop into both eyes daily as needed (dry eyes).   Yes [provider]  rizatriptan (MAXALT-MLT) 10 MG disintegrating tablet Take 10 mg by mouth as needed for migraine. May repeat in 2 hours if needed   Yes [provider]  pantoprazole (PROTONIX) 40 MG tablet Take 1 tablet (40 mg total) by mouth daily. For acid reflux Patient taking differently: Take 40 mg by mouth daily as needed. For acid reflux 12/26/16   Nilda Simmer, NP    Allergies as of 07/31/2018 - Review Complete 07/30/2018  Allergen Reaction Noted  . Cymbalta [duloxetine hcl] Other (See Comments) 02/09/2017  . Nortriptyline  Nausea And Vomiting 02/09/2017    Family History  Problem Relation Age of Onset  . Hypertension Maternal Grandmother   . Diabetes Maternal Grandmother   . Heart disease Maternal Grandmother 40       MI  . Hypertension Maternal Grandfather   . Throat cancer Maternal Grandfather   . Breast cancer Mother 43  . Hypertension Father     Social History   Socioeconomic History  . Marital status: Married    Spouse name: Not on file  . Number of children: Not on file  . Years of education: Not on file  . Highest education level: Not on file  Occupational History  . Not on file  Social Needs  . Financial resource strain: Not on file  . Food insecurity:    Worry: Not on file    Inability: Not on file  . Transportation needs:    Medical: Not on file    Non-medical: Not on file  Tobacco Use  . Smoking status: Former Research scientist (life sciences)  . Smokeless tobacco: Never Used  Substance and Sexual Activity  . Alcohol use: Yes    Comment: occassionally  . Drug use: No  . Sexual activity: Yes    Birth control/protection: Surgical  Lifestyle  . Physical activity:    Days per week: Not on file    Minutes per session: Not on file  . Stress: Not on file  Relationships  . Social connections:  Talks on phone: Not on file    Gets together: Not on file    Attends religious service: Not on file    Active member of club or organization: Not on file    Attends meetings of clubs or organizations: Not on file    Relationship status: Not on file  . Intimate partner violence:    Fear of current or ex partner: Not on file    Emotionally abused: Not on file    Physically abused: Not on file    Forced sexual activity: Not on file  Other Topics Concern  . Not on file  Social History Narrative  . Not on file    Review of Systems: See HPI, otherwise negative ROS   Physical Exam: BP 109/75   Pulse 70   Temp 97.9 F (36.6 C) (Oral)   Resp 20   Ht 5\' 2"  (1.575 m)   Wt 76.7 kg   SpO2 97%   BMI  30.91 kg/m  General:   Alert,  pleasant and cooperative in NAD Head:  Normocephalic and atraumatic. Neck:  Supple; Lungs:  Clear throughout to auscultation.    Heart:  Regular rate and rhythm. Abdomen:  Soft, nontender and nondistended. Normal bowel sounds, without guarding, and without rebound.   Neurologic:  Alert and  oriented x4;  grossly normal neurologically.  Impression/Plan:    SCREENING  Plan:  1. TCS TODAY DISCUSSED PROCEDURE, BENEFITS, & RISKS: < 1% chance of medication reaction, bleeding, perforation, or rupture of spleen/liver.

## 2018-12-10 NOTE — Discharge Instructions (Signed)
You have small internal hemorrhoids AND MODERATE EXTERNAL HEMORRHOIDS and diverticulosis IN YOUR LEFT AND RIGHT COLON. YOU HAD SIX POLYPS REMOVED.    DRINK WATER TO KEEP YOUR URINE LIGHT YELLOW.  CONTINUE YOUR WEIGHT LOSS EFFORTS. YOUR BODY MASS INDEX IS OVER 30 WHICH MEANS YOU ARE OBESE. OBESITY IS ASSOCIATED WITH AN INCREASE FOR ALL CANCERS, INCLUDING ESOPHAGEAL AND COLON CANCER. A WEIGHT OF 160 lbs or less    WILL GET YOUR BODY MASS INDEX(BMI) UNDER 30.  FOLLOW A HIGH FIBER DIET. AVOID ITEMS THAT CAUSE BLOATING. See info below.  YOUR BIOPSY RESULTS WILL BE BACK IN 5 BUSINESS DAYS.  USE PREPARATION H FOUR TIMES  A DAY for three days then IF NEEDED TO RELIEVE RECTAL PAIN/PRESSURE/BLEEDING.  Next colonoscopy in 3 years.  Colonoscopy Care After Read the instructions outlined below and refer to this sheet in the next week. These discharge instructions provide you with general information on caring for yourself after you leave the hospital. While your treatment has been planned according to the most current medical practices available, unavoidable complications occasionally occur. If you have any problems or questions after discharge, call DR. Jerricka Carvey, [phone].  ACTIVITY  You may resume your regular activity, but move at a slower pace for the next 24 hours.   Take frequent rest periods for the next 24 hours.   Walking will help get rid of the air and reduce the bloated feeling in your belly (abdomen).   No driving for 24 hours (because of the medicine (anesthesia) used during the test).   You may shower.   Do not sign any important legal documents or operate any machinery for 24 hours (because of the anesthesia used during the test).    NUTRITION  Drink plenty of fluids.   You may resume your normal diet as instructed by your doctor.   Begin with a light meal and progress to your normal diet. Heavy or fried foods are harder to digest and may make you feel sick to your stomach  (nauseated).   Avoid alcoholic beverages for 24 hours or as instructed.    MEDICATIONS  You may resume your normal medications.   WHAT YOU CAN EXPECT TODAY  Some feelings of bloating in the abdomen.   Passage of more gas than usual.   Spotting of blood in your stool or on the toilet paper  .  IF YOU HAD POLYPS REMOVED DURING THE COLONOSCOPY:  Eat a soft diet IF YOU HAVE NAUSEA, BLOATING, ABDOMINAL PAIN, OR VOMITING.    FINDING OUT THE RESULTS OF YOUR TEST Not all test results are available during your visit. DR. Oneida Alar WILL CALL YOU WITHIN 14 DAYS OF YOUR PROCEDUE WITH YOUR RESULTS. Do not assume everything is normal if you have not heard from DR. Archimedes Harold, CALL HER OFFICE AT [phone].  SEEK IMMEDIATE MEDICAL ATTENTION AND CALL THE OFFICE: [phone] IF:  You have more than a spotting of blood in your stool.   Your belly is swollen (abdominal distention).   You are nauseated or vomiting.   You have a temperature over 101F.   You have abdominal pain or discomfort that is severe or gets worse throughout the day.  High-Fiber Diet A high-fiber diet changes your normal diet to include more whole grains, legumes, fruits, and vegetables. Changes in the diet involve replacing refined carbohydrates with unrefined foods. The calorie level of the diet is essentially unchanged. The Dietary Reference Intake (recommended amount) for adult males is 38 grams per day. For  adult females, it is 25 grams per day. Pregnant and lactating women should consume 28 grams of fiber per day. Fiber is the intact part of a plant that is not broken down during digestion. Functional fiber is fiber that has been isolated from the plant to provide a beneficial effect in the body.  PURPOSE  Increase stool bulk.   Ease and regulate bowel movements.   Lower cholesterol.   REDUCE RISK OF COLON CANCER  INDICATIONS THAT YOU NEED MORE FIBER  Constipation and hemorrhoids.   Uncomplicated  diverticulosis (intestine condition) and irritable bowel syndrome.   Weight management.   As a protective measure against hardening of the arteries (atherosclerosis), diabetes, and cancer.   GUIDELINES FOR INCREASING FIBER IN THE DIET  Start adding fiber to the diet slowly. A gradual increase of about 5 more grams (2 slices of whole-wheat bread, 2 servings of most fruits or vegetables, or 1 bowl of high-fiber cereal) per day is best. Too rapid an increase in fiber may result in constipation, flatulence, and bloating.   Drink enough water and fluids to keep your urine clear or pale yellow. Water, juice, or caffeine-free drinks are recommended. Not drinking enough fluid may cause constipation.   Eat a variety of high-fiber foods rather than one type of fiber.   Try to increase your intake of fiber through using high-fiber foods rather than fiber pills or supplements that contain small amounts of fiber.   The goal is to change the types of food eaten. Do not supplement your present diet with high-fiber foods, but replace foods in your present diet.   INCLUDE A VARIETY OF FIBER SOURCES  Replace refined and processed grains with whole grains, canned fruits with fresh fruits, and incorporate other fiber sources. White rice, white breads, and most bakery goods contain little or no fiber.   Brown whole-grain rice, buckwheat oats, and many fruits and vegetables are all good sources of fiber. These include: broccoli, Brussels sprouts, cabbage, cauliflower, beets, sweet potatoes, white potatoes (skin on), carrots, tomatoes, eggplant, squash, berries, fresh fruits, and dried fruits.   Cereals appear to be the richest source of fiber. Cereal fiber is found in whole grains and bran. Bran is the fiber-rich outer coat of cereal grain, which is largely removed in refining. In whole-grain cereals, the bran remains. In breakfast cereals, the largest amount of fiber is found in those with "bran" in their names.  The fiber content is sometimes indicated on the label.   You may need to include additional fruits and vegetables each day.   In baking, for 1 cup white flour, you may use the following substitutions:   1 cup whole-wheat flour minus 2 tablespoons.   1/2 cup white flour plus 1/2 cup whole-wheat flour.   Polyps, Colon  A polyp is extra tissue that grows inside your body. Colon polyps grow in the large intestine. The large intestine, also called the colon, is part of your digestive system. It is a long, hollow tube at the end of your digestive tract where your body makes and stores stool. Most polyps are not dangerous. They are benign. This means they are not cancerous. But over time, some types of polyps can turn into cancer. Polyps that are smaller than a pea are usually not harmful. But larger polyps could someday become or may already be cancerous. To be safe, doctors remove all polyps and test them.   PREVENTION There is not one sure way to prevent polyps. You might be  able to lower your risk of getting them if you:  Eat more fruits and vegetables and less fatty food.   Do not smoke.   Avoid alcohol.   Exercise every day.   Lose weight if you are overweight.   Eating more calcium and folate can also lower your risk of getting polyps. Some foods that are rich in calcium are milk, cheese, and broccoli. Some foods that are rich in folate are chickpeas, kidney beans, and spinach.    Diverticulosis Diverticulosis is a common condition that develops when small pouches (diverticula) form in the wall of the colon. The risk of diverticulosis increases with age. It happens more often in people who eat a low-fiber diet. Most individuals with diverticulosis have no symptoms. Those individuals with symptoms usually experience belly (abdominal) pain, constipation, or loose stools (diarrhea).  HOME CARE INSTRUCTIONS  Increase the amount of fiber in your diet as directed by your caregiver or  dietician. This may reduce symptoms of diverticulosis.   Drink at least 6 to 8 glasses of water each day to prevent constipation.   Try not to strain when you have a bowel movement.   Avoiding nuts and seeds to prevent complications is NOT NECESSARY.   FOODS HAVING HIGH FIBER CONTENT INCLUDE:  Fruits. Apple, peach, pear, tangerine, raisins, prunes.   Vegetables. Brussels sprouts, asparagus, broccoli, cabbage, carrot, cauliflower, romaine lettuce, spinach, summer squash, tomato, winter squash, zucchini.   Starchy Vegetables. Baked beans, kidney beans, lima beans, split peas, lentils, potatoes (with skin).   Grains. Whole wheat bread, brown rice, bran flake cereal, plain oatmeal, white rice, shredded wheat, bran muffins.   SEEK IMMEDIATE MEDICAL CARE IF:  You develop increasing pain or severe bloating.   You have an oral temperature above 101F.   You develop vomiting or bowel movements that are bloody or black.   Hemorrhoids Hemorrhoids are dilated (enlarged) veins around the rectum. Sometimes clots will form in the veins. This makes them swollen and painful. These are called thrombosed hemorrhoids. Causes of hemorrhoids include:  Constipation.   Straining to have a bowel movement.   HEAVY LIFTING   HOME CARE INSTRUCTIONS  Eat a well balanced diet and drink 6 to 8 glasses of water every day to avoid constipation. You may also use a bulk laxative.   Avoid straining to have bowel movements.   Keep anal area dry and clean.   Do not use a donut shaped pillow or sit on the toilet for long periods. This increases blood pooling and pain.   Move your bowels when your body has the urge; this will require less straining and will decrease pain and pressure.

## 2018-12-13 NOTE — Telephone Encounter (Signed)
Spoke with patient to let her know that her renewal is coming on 01/03/19.She informed me that she is seeing a specialist for her migraines now and he is prescribing her medication now.So he would be the one to updated her FMLA form not our office because we arent doing her follow ups and medication now.But if she had any problems with her forms to please call our office and I will get with primary care doctor for advise on forms.

## 2018-12-17 ENCOUNTER — Encounter (HOSPITAL_COMMUNITY): Payer: Self-pay | Admitting: Gastroenterology

## 2018-12-17 NOTE — Progress Notes (Signed)
PT is aware.

## 2018-12-17 NOTE — Progress Notes (Signed)
LMOM to call.

## 2018-12-24 ENCOUNTER — Observation Stay (HOSPITAL_COMMUNITY)
Admission: EM | Admit: 2018-12-24 | Discharge: 2018-12-25 | Disposition: A | Discharge status: Home / Self Care | Payer: 59 | Attending: Internal Medicine | Admitting: Internal Medicine

## 2018-12-24 ENCOUNTER — Emergency Department (HOSPITAL_COMMUNITY): Payer: 59

## 2018-12-24 ENCOUNTER — Other Ambulatory Visit: Payer: Self-pay

## 2018-12-24 ENCOUNTER — Encounter (HOSPITAL_COMMUNITY): Payer: Self-pay | Admitting: Emergency Medicine

## 2018-12-24 ENCOUNTER — Observation Stay (HOSPITAL_BASED_OUTPATIENT_CLINIC_OR_DEPARTMENT_OTHER): Payer: 59

## 2018-12-24 DIAGNOSIS — R112 Nausea with vomiting, unspecified: Secondary | ICD-10-CM | POA: Diagnosis not present

## 2018-12-24 DIAGNOSIS — R079 Chest pain, unspecified: Secondary | ICD-10-CM | POA: Diagnosis not present

## 2018-12-24 DIAGNOSIS — R0602 Shortness of breath: Secondary | ICD-10-CM | POA: Diagnosis not present

## 2018-12-24 DIAGNOSIS — G43809 Other migraine, not intractable, without status migrainosus: Secondary | ICD-10-CM

## 2018-12-24 DIAGNOSIS — R072 Precordial pain: Secondary | ICD-10-CM | POA: Diagnosis not present

## 2018-12-24 DIAGNOSIS — Z87891 Personal history of nicotine dependence: Secondary | ICD-10-CM | POA: Diagnosis not present

## 2018-12-24 DIAGNOSIS — R0789 Other chest pain: Principal | ICD-10-CM | POA: Insufficient documentation

## 2018-12-24 DIAGNOSIS — N289 Disorder of kidney and ureter, unspecified: Secondary | ICD-10-CM | POA: Diagnosis not present

## 2018-12-24 DIAGNOSIS — Z79899 Other long term (current) drug therapy: Secondary | ICD-10-CM | POA: Diagnosis not present

## 2018-12-24 LAB — CBC WITH DIFFERENTIAL/PLATELET
Abs Immature Granulocytes: 0.05 10*3/uL (ref 0.00–0.07)
Basophils Absolute: 0.1 10*3/uL (ref 0.0–0.1)
Basophils Relative: 1 %
Eosinophils Absolute: 0.4 10*3/uL (ref 0.0–0.5)
Eosinophils Relative: 4 %
HCT: 44.1 % (ref 36.0–46.0)
Hemoglobin: 14.3 g/dL (ref 12.0–15.0)
IMMATURE GRANULOCYTES: 1 %
Lymphocytes Relative: 56 %
Lymphs Abs: 5.4 10*3/uL — ABNORMAL HIGH (ref 0.7–4.0)
MCH: 28.5 pg (ref 26.0–34.0)
MCHC: 32.4 g/dL (ref 30.0–36.0)
MCV: 87.8 fL (ref 80.0–100.0)
Monocytes Absolute: 0.6 10*3/uL (ref 0.1–1.0)
Monocytes Relative: 6 %
NEUTROS PCT: 32 %
Neutro Abs: 3 10*3/uL (ref 1.7–7.7)
Platelets: 280 10*3/uL (ref 150–400)
RBC: 5.02 MIL/uL (ref 3.87–5.11)
RDW: 13 % (ref 11.5–15.5)
WBC: 9.4 10*3/uL (ref 4.0–10.5)
nRBC: 0 % (ref 0.0–0.2)

## 2018-12-24 LAB — COMPREHENSIVE METABOLIC PANEL
ALT: 27 U/L (ref 0–44)
AST: 19 U/L (ref 15–41)
Albumin: 3.6 g/dL (ref 3.5–5.0)
Alkaline Phosphatase: 67 U/L (ref 38–126)
Anion gap: 7 (ref 5–15)
BUN: 12 mg/dL (ref 6–20)
CO2: 22 mmol/L (ref 22–32)
Calcium: 9 mg/dL (ref 8.9–10.3)
Chloride: 110 mmol/L (ref 98–111)
Creatinine, Ser: 0.64 mg/dL (ref 0.44–1.00)
GFR calc Af Amer: >60 mL/min (ref >60)
GFR calc non Af Amer: >60 mL/min (ref >60)
Glucose, Bld: 110 mg/dL — ABNORMAL HIGH (ref 70–99)
Potassium: 3.7 mmol/L (ref 3.5–5.1)
SODIUM: 139 mmol/L (ref 135–145)
Total Bilirubin: 0.5 mg/dL (ref 0.3–1.2)
Total Protein: 6.3 g/dL — ABNORMAL LOW (ref 6.5–8.1)

## 2018-12-24 LAB — I-STAT CHEM 8, ED
BUN: 12 mg/dL (ref 6–20)
CREATININE: 0.6 mg/dL (ref 0.44–1.00)
Calcium, Ion: 1.18 mmol/L (ref 1.15–1.40)
Chloride: 107 mmol/L (ref 98–111)
Glucose, Bld: 109 mg/dL — ABNORMAL HIGH (ref 70–99)
HCT: 44 % (ref 36.0–46.0)
Hemoglobin: 15 g/dL (ref 12.0–15.0)
Potassium: 4 mmol/L (ref 3.5–5.1)
Sodium: 140 mmol/L (ref 135–145)
TCO2: 25 mmol/L (ref 22–32)

## 2018-12-24 LAB — ECHOCARDIOGRAM COMPLETE
HEIGHTINCHES: 62 in
Weight: 2726.65 oz

## 2018-12-24 LAB — TROPONIN I
Troponin I: <0.03 ng/mL (ref <0.03)
Troponin I: <0.03 ng/mL (ref <0.03)
Troponin I: <0.03 ng/mL (ref <0.03)

## 2018-12-24 LAB — POC OCCULT BLOOD, ED: Fecal Occult Bld: NEGATIVE

## 2018-12-24 LAB — LIPASE, BLOOD: Lipase: 27 U/L (ref 11–51)

## 2018-12-24 LAB — I-STAT TROPONIN, ED: Troponin i, poc: 0 ng/mL (ref 0.00–0.08)

## 2018-12-24 LAB — I-STAT CG4 LACTIC ACID, ED: Lactic Acid, Venous: 1.81 mmol/L (ref 0.5–1.9)

## 2018-12-24 LAB — CBG MONITORING, ED: Glucose-Capillary: 106 mg/dL — ABNORMAL HIGH (ref 70–99)

## 2018-12-24 MED ORDER — ONDANSETRON HCL 4 MG/2ML IJ SOLN
4.0000 mg | Freq: Once | INTRAMUSCULAR | Status: AC
  Administered 2018-12-24: 4 mg via INTRAVENOUS
  Filled 2018-12-24: qty 2

## 2018-12-24 MED ORDER — IOPAMIDOL (ISOVUE-370) INJECTION 76%
100.0000 mL | Freq: Once | INTRAVENOUS | Status: AC | PRN
  Administered 2018-12-24: 100 mL via INTRAVENOUS

## 2018-12-24 MED ORDER — ENOXAPARIN SODIUM 40 MG/0.4ML ~~LOC~~ SOLN
40.0000 mg | SUBCUTANEOUS | Status: DC
  Administered 2018-12-24: 40 mg via SUBCUTANEOUS
  Filled 2018-12-24 (×2): qty 0.4

## 2018-12-24 MED ORDER — RIZATRIPTAN BENZOATE 10 MG PO TBDP
10.0000 mg | ORAL_TABLET | ORAL | Status: DC | PRN
  Filled 2018-12-24: qty 1

## 2018-12-24 MED ORDER — ONDANSETRON HCL 4 MG PO TABS: 4.0000 mg | ORAL_TABLET | Freq: Four times a day (QID) | ORAL | Status: DC | PRN

## 2018-12-24 MED ORDER — ONDANSETRON HCL 4 MG/2ML IJ SOLN: 4.0000 mg | Freq: Four times a day (QID) | INTRAMUSCULAR | Status: DC | PRN

## 2018-12-24 MED ORDER — ACETAMINOPHEN 325 MG PO TABS
650.0000 mg | ORAL_TABLET | Freq: Four times a day (QID) | ORAL | Status: DC | PRN
  Administered 2018-12-24: 650 mg via ORAL
  Filled 2018-12-24: qty 2

## 2018-12-24 MED ORDER — SUCRALFATE 1 GM/10ML PO SUSP
1.0000 g | Freq: Three times a day (TID) | ORAL | Status: DC
  Administered 2018-12-24 – 2018-12-25 (×4): 1 g via ORAL
  Filled 2018-12-24 (×4): qty 10

## 2018-12-24 MED ORDER — PANTOPRAZOLE SODIUM 40 MG PO TBEC
40.0000 mg | DELAYED_RELEASE_TABLET | Freq: Two times a day (BID) | ORAL | Status: DC
  Administered 2018-12-24 – 2018-12-25 (×2): 40 mg via ORAL
  Filled 2018-12-24 (×2): qty 1

## 2018-12-24 NOTE — ED Notes (Signed)
ECHO in progress at this time 

## 2018-12-24 NOTE — ED Notes (Signed)
Patient transported to CT 

## 2018-12-24 NOTE — H&P (Signed)
History and Physical    Nichole Brown:[id number] DOB: 03/09/1966 DOA: 12/24/2018  I have briefly reviewed the patient's prior medical records in [place]  PCP: Mikey Kirschner, MD  Patient coming from: home  Chief Complaint: chest pain  HPI: Nichole Brown is a 53 y.o. female with medical history significant of migraine headaches, depression, anxiety, presents to the hospital with chief complaint of chest pain upper epigastric pain.  Patient reports that she was in her normal state of health up until earlier today when she all of a sudden experienced severe excruciating midsternal chest pain as well as upper epigastric pain, going into her back, and she became so weak that she could not move.  Family was nearby and they report that she was extremely weak and extremely pale appearing.  They checked her pulse and felt that she was bradycardic with a heart rate of 48.  On arrival to the ED her vital signs are normal.  Patient currently feels a little bit better, less pain, and tells me that she was also having nausea.  Denies vomiting.  She tells me that she had few episodes of loose stools yesterday but none today.  Her chest pain was also associated with mild shortness of breath.  She denies any migraine headaches currently. She also has noticed significant increase in burping since this morning   ED Course: In the ED her vital signs are stable, she is normotensive, she underwent a CT angiogram of the chest abdomen and pelvis which was unremarkable except for a left adrenal adenoma which needs to be follow-up with a CT in 12 months.  We are asked to admit for chest pain rule out  Review of Systems: As per HPI otherwise 10 point review of systems negative.   Past Medical History:  Diagnosis Date  . Anxiety   . Bronchitis   . Depression   . Fibromyalgia 2005  . GERD (gastroesophageal reflux disease)   . Insomnia   . Migraine headache   . Pneumonia     Past Surgical  History:  Procedure Laterality Date  . ABDOMINAL HYSTERECTOMY    . CESAREAN SECTION    . CHOLECYSTECTOMY  1988  . COLONOSCOPY N/A 12/10/2018   Procedure: COLONOSCOPY;  Surgeon: Danie Binder, MD;  Location: [facility];  Service: Endoscopy;  Laterality: N/A;  10:30am  . open heart surgery  1967  . PATENT DUCTUS ARTERIOUS REPAIR  1967  . POLYPECTOMY  12/10/2018   Procedure: POLYPECTOMY;  Surgeon: Danie Binder, MD;  Location: [facility];  Service: Endoscopy;;  hepatic flexure, descending, transverse  . SHOULDER ARTHROSCOPY Left 06/16/2017   Procedure: LEFT SHOULDER ARTHROSCOPY, DEBRIDEMENT, AND DECOMPRESSION;  Surgeon: Newt Minion, MD;  Location: [facility];  Service: Orthopedics;  Laterality: Left;  . SHOULDER SURGERY Left 2008   AC joint     reports that she has quit smoking. She has never used smokeless tobacco. She reports current alcohol use. She reports that she does not use drugs.  Allergies  Allergen Reactions  . Cymbalta [Duloxetine Hcl] Other (See Comments)    Hallucinations; sleep walking  . Nortriptyline Nausea And Vomiting  . Phenergan [Promethazine Hcl]     Restless legs and cramping    Family History  Problem Relation Age of Onset  . Hypertension Maternal Grandmother   . Diabetes Maternal Grandmother   . Heart disease Maternal Grandmother 40       MI  . Hypertension Maternal Grandfather   .  Throat cancer Maternal Grandfather   . Breast cancer Mother 86  . Hypertension Father   . Colon cancer Neg Hx     Prior to Admission medications   Medication Sig Start Date End Date Taking? Authorizing Provider  Erenumab-aooe (AIMOVIG) 70 MG/ML SOAJ Inject 70 mg into the skin. Once a month 03/23/18  Yes [provider]  ondansetron (ZOFRAN-ODT) 4 MG disintegrating tablet Take 1 tablet (4 mg total) by mouth every 8 (eight) hours as needed for nausea or vomiting. 06/12/17  Yes Cuthriell, Charline Bills, PA-C  pantoprazole (PROTONIX) 40 MG tablet Take 1 tablet (40 mg  total) by mouth daily. For acid reflux Patient taking differently: Take 40 mg by mouth daily as needed. For acid reflux 12/26/16  Yes Nilda Simmer, NP  Propylene Glycol (SYSTANE BALANCE) 0.6 % SOLN Place 1 drop into both eyes daily as needed (dry eyes).   Yes [provider]  rizatriptan (MAXALT-MLT) 10 MG disintegrating tablet Take 10 mg by mouth as needed for migraine. May repeat in 2 hours if needed   Yes [provider]  Vitamin D, Ergocalciferol, (DRISDOL) 1.25 MG (50000 UT) CAPS capsule Take 50,000 Units by mouth every 7 (seven) days.    Yes [provider]    Physical Exam: Vitals:   12/24/18 0830 12/24/18 0900 12/24/18 0930 12/24/18 1000  BP: 113/72 110/76 114/75 95/67  Pulse: 63 (!) 59 (!) 59 62  Resp: 13 14 16 15   Temp:      TempSrc:      SpO2: 96% 98% 98% 97%  Weight:      Height:       Constitutional: NAD, calm, comfortable Eyes: PERRL, lids and conjunctivae normal ENMT: Mucous membranes are moist.  Neck: normal, supple Respiratory: clear to auscultation bilaterally, no wheezing, no crackles. Normal respiratory effort. No accessory muscle use.  Cardiovascular: Regular rate and rhythm, no murmurs / rubs / gallops. No extremity edema. 2+ pedal pulses.  Abdomen: no tenderness, no masses palpated. Bowel sounds positive.  Musculoskeletal: no clubbing / cyanosis.  Skin: no rashes, lesions, ulcers. No induration Neurologic: CN 2-12 grossly intact. Strength 5/5 in all 4.  Psychiatric: Normal judgment and insight. Alert and oriented x 3. Normal mood.   Labs on Admission: I have personally reviewed following labs and imaging studies  CBC: Recent Labs  Lab 12/24/18 0438 12/24/18 0444  WBC 9.4  --   NEUTROABS 3.0  --   HGB 14.3 15.0  HCT 44.1 44.0  MCV 87.8  --   PLT 280  --    Basic Metabolic Panel: Recent Labs  Lab 12/24/18 0438 12/24/18 0444  NA 139 140  K 3.7 4.0  CL 110 107  CO2 22  --   GLUCOSE 110* 109*  BUN 12 12    CREATININE 0.64 0.60  CALCIUM 9.0  --    GFR: Estimated Creatinine Clearance: 77.4 mL/min (by C-G formula based on SCr of 0.6 mg/dL). Liver Function Tests: Recent Labs  Lab 12/24/18 0438  AST 19  ALT 27  ALKPHOS 67  BILITOT 0.5  PROT 6.3*  ALBUMIN 3.6   Recent Labs  Lab 12/24/18 0438  LIPASE 27   No results for input(s): AMMONIA in the last 168 hours. Coagulation Profile: No results for input(s): INR, PROTIME in the last 168 hours. Cardiac Enzymes: Recent Labs  Lab 12/24/18 0438  TROPONINI <0.03   BNP (last 3 results) No results for input(s): PROBNP in the last 8760 hours. HbA1C: No results  for input(s): HGBA1C in the last 72 hours. CBG: Recent Labs  Lab 12/24/18 0439  GLUCAP 106*   Lipid Profile: No results for input(s): CHOL, HDL, LDLCALC, TRIG, CHOLHDL, LDLDIRECT in the last 72 hours. Thyroid Function Tests: No results for input(s): TSH, T4TOTAL, FREET4, T3FREE, THYROIDAB in the last 72 hours. Anemia Panel: No results for input(s): VITAMINB12, FOLATE, FERRITIN, TIBC, IRON, RETICCTPCT in the last 72 hours. Urine analysis:    Component Value Date/Time   COLORURINE STRAW (A) 06/12/2017 2214   APPEARANCEUR CLEAR (A) 06/12/2017 2214   LABSPEC 1.004 (L) 06/12/2017 2214   PHURINE 6.0 06/12/2017 2214   GLUCOSEU NEGATIVE 06/12/2017 2214   HGBUR NEGATIVE 06/12/2017 2214   BILIRUBINUR NEGATIVE 06/12/2017 2214   KETONESUR NEGATIVE 06/12/2017 2214   PROTEINUR NEGATIVE 06/12/2017 2214   NITRITE NEGATIVE 06/12/2017 2214   LEUKOCYTESUR NEGATIVE 06/12/2017 2214     Radiological Exams on Admission: Dg Chest Portable 1 View  Result Date: 12/24/2018 CLINICAL DATA:  Chest pain EXAM: PORTABLE CHEST 1 VIEW COMPARISON:  10/10/2018 FINDINGS: Artifact from EKG leads. Interstitial coarsening similar to priors. There is no edema, consolidation, effusion, or pneumothorax. Normal heart size and mediastinal contours. IMPRESSION: Stable compared to prior.  No acute finding.  Electronically Signed   By: Monte Fantasia M.D.   On: 12/24/2018 05:29   Ct Angio Chest/abd/pel For Dissection W And/or Wo Contrast  Result Date: 12/24/2018 CLINICAL DATA:  Chest pain for 1 day with shortness of breath EXAM: CT ANGIOGRAPHY CHEST, ABDOMEN AND PELVIS TECHNIQUE: Multidetector CT imaging through the chest, abdomen and pelvis was performed using the standard protocol during bolus administration of intravenous contrast. Multiplanar reconstructed images and MIPs were obtained and reviewed to evaluate the vascular anatomy. CONTRAST:  169mL [id number] COMPARISON:  Plain film from earlier in the same day. FINDINGS: CTA CHEST FINDINGS Cardiovascular: Thoracic aorta shows a normal branching pattern without aneurysmal dilatation or dissection. No cardiac enlargement is noted. No significant coronary calcifications are seen. Pulmonary artery is within normal limits although not timed for pulmonary embolism evaluation. Mediastinum/Nodes: Thoracic inlet is within normal limits. No hilar or mediastinal adenopathy is seen. The esophagus is within normal limits. Lungs/Pleura: Lungs are clear. No pleural effusion or pneumothorax. Musculoskeletal: Mild degenerative changes of the thoracic spine are noted. No acute bony abnormality is seen. Review of the MIP images confirms the above findings. CTA ABDOMEN AND PELVIS FINDINGS VASCULAR Aorta: Normal caliber aorta without aneurysm, dissection, vasculitis or significant stenosis. Celiac: Patent without evidence of aneurysm, dissection, vasculitis or significant stenosis. SMA: Patent without evidence of aneurysm, dissection, vasculitis or significant stenosis. Renals: Both renal arteries are patent without evidence of aneurysm, dissection, vasculitis, fibromuscular dysplasia or significant stenosis. IMA: Patent without evidence of aneurysm, dissection, vasculitis or significant stenosis. Iliacs: Patent without evidence of aneurysm, dissection, vasculitis or significant  stenosis. Veins: Left retroaortic renal vein is noted. No other venous abnormality is seen. Review of the MIP images confirms the above findings. NON-VASCULAR Hepatobiliary: No focal liver abnormality is seen. Status post cholecystectomy. No biliary dilatation. Pancreas: Unremarkable. No pancreatic ductal dilatation or surrounding inflammatory changes. Spleen: Normal in size without focal abnormality. Adrenals/Urinary Tract: Right adrenal gland is within normal limits. Left adrenal gland demonstrates a 13 mm slightly hypodense lesion likely representing a small adenoma. Stomach/Bowel: Stomach is within normal limits. Appendix appears normal. No evidence of bowel wall thickening, distention, or inflammatory changes. Lymphatic: No significant vascular findings are present. No enlarged abdominal or pelvic lymph nodes. Reproductive: Uterus has been surgically removed.  No adnexal mass is noted. Other: Minimal free fluid is noted within the pelvis likely physiologic in nature. Musculoskeletal: No acute or significant osseous findings. Review of the MIP images confirms the above findings. IMPRESSION: No evidence of aortic dissection or pulmonary embolism. 13 mm left adrenal lesion likely representing an adenoma. Consideration for follow-up adrenal CT in 12 months is recommended. This recommendation follows ACR consensus guidelines: Management of Incidental Adrenal Masses: A White Paper of the ACR Incidental Findings Committee. J Am Coll Radiol 2017;14:1038-1044. Electronically Signed   By: Inez Catalina M.D.   On: 12/24/2018 07:24    EKG: Independently reviewed. Sinus rhythm   Assessment/Plan Active Problems:   Chest pain   Principal Problem Chest pain, atypical -Sudden onset, radiating to her back, she has no cardiac history, underwent a treadmill stress test in 2018 which was unremarkable.  This is less likely cardiac, but will monitor on telemetry, cycle cardiac enzymes, given history of cardiac surgery as an  infant for PFO repair--Per patient, will obtain a 2D echo -there is possibly is a GI component to this, placed on twice daily PPI as well as Carafate -?  Whether this was an esophageal spasm  Active Problems ?  Reported bradycardia -Heart rate is in the 50s in the ED but she is not hypotensive.  We will monitor on telemetry, obtain 2D echo  Migraine headaches -Continue home medications  Nausea/dry heaves -Symptomatic management, PPI  DVT prophylaxis: Lovenox Code Status: Full code Family Communication: Family at bedside Disposition Plan: Admit to telemetry Consults called: None   Marzetta Board, MD, PhD [facility]  Contact via [email or web address]  [facility] Office Info P: [phone]  F: [phone]   12/24/2018, 10:53 AM

## 2018-12-24 NOTE — ED Triage Notes (Signed)
Pt c/o chest pain 

## 2018-12-24 NOTE — Progress Notes (Signed)
*  PRELIMINARY RESULTS* Echocardiogram 2D Echocardiogram has been performed.  Nichole Brown 12/24/2018, 11:22 AM

## 2018-12-24 NOTE — ED Provider Notes (Addendum)
[facility] EMERGENCY DEPARTMENT Provider Note   CSN: [id number] Arrival date & time: 12/24/18  0435     History   Chief Complaint Chief Complaint  Patient presents with  . Chest Pain    HPI Nichole Brown is a 53 y.o. female.  Level 5 caveat for acuity of condition.  Patient brought in by private vehicle unable to get out of car on her own.  She was pale and diaphoretic.  Reportedly started complaining of chest pain, back pain and abdominal pain about 30 minutes ago.  Had nausea but no vomiting.  No diarrhea or blood in the stool.  Reports pain in the center of her chest that radiates to her back and upper abdomen.  Associated with some shortness of breath.  Nausea but no vomiting.  History of cardiac surgery as a child.  Denies any history of MI.  She is never had this kind of pain before.  Previous cholecystectomy.  Does have a history of acid reflux but this feels different.  No pain with urination or blood in the urine.  The history is provided by the patient and the EMS personnel. The history is limited by the condition of the patient.  Chest Pain    Past Medical History:  Diagnosis Date  . Anxiety   . Bronchitis   . Depression   . Fibromyalgia 2005  . GERD (gastroesophageal reflux disease)   . Insomnia   . Migraine headache   . Pneumonia     Patient Active Problem List   Diagnosis Date Noted  . Encounter for screening colonoscopy   . Taking multiple medications for chronic disease 07/30/2018  . Impingement syndrome of left shoulder   . 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 Gastroesophageal reflux disease without esophagitis 12/26/2016  . Grief at loss of child 11/09/2016  . Sternal fracture 10/06/2015  . Contusion of leg 10/06/2015  . Migraine headache 07/24/2013  . Insomnia 05/30/2013  . Fibromyalgia 05/30/2013    Past Surgical History:  Procedure Laterality Date  . ABDOMINAL HYSTERECTOMY    . CESAREAN SECTION    . CHOLECYSTECTOMY  1988    . COLONOSCOPY N/A 12/10/2018   Procedure: COLONOSCOPY;  Surgeon: Danie Binder, MD;  Location: [facility];  Service: Endoscopy;  Laterality: N/A;  10:30am  . open heart surgery  1967  . PATENT DUCTUS ARTERIOUS REPAIR  1967  . POLYPECTOMY  12/10/2018   Procedure: POLYPECTOMY;  Surgeon: Danie Binder, MD;  Location: [facility];  Service: Endoscopy;;  hepatic flexure, descending, transverse  . SHOULDER ARTHROSCOPY Left 06/16/2017   Procedure: LEFT SHOULDER ARTHROSCOPY, DEBRIDEMENT, AND DECOMPRESSION;  Surgeon: Newt Minion, MD;  Location: [place];  Service: Orthopedics;  Laterality: Left;  . SHOULDER SURGERY Left 2008   AC joint     OB History   No obstetric history on file.      Home Medications    Prior to Admission medications   Medication Sig Start Date End Date Taking? Authorizing Provider  Erenumab-aooe (AIMOVIG) 70 MG/ML SOAJ Inject 70 mg into the skin. Once a month 03/23/18   [provider]  ondansetron (ZOFRAN-ODT) 4 MG disintegrating tablet Take 1 tablet (4 mg total) by mouth every 8 (eight) hours as needed for nausea or vomiting. 06/12/17   Cuthriell, Charline Bills, PA-C  pantoprazole (PROTONIX) 40 MG tablet Take 1 tablet (40 mg total) by mouth daily. For acid reflux Patient taking differently: Take 40 mg by mouth daily as  needed. For acid reflux 12/26/16   Nilda Simmer, NP  Propylene Glycol (SYSTANE BALANCE) 0.6 % SOLN Place 1 drop into both eyes daily as needed (dry eyes).    [provider]  rizatriptan (MAXALT-MLT) 10 MG disintegrating tablet Take 10 mg by mouth as needed for migraine. May repeat in 2 hours if needed    [provider]    Family History Family History  Problem Relation Age of Onset  . Hypertension Maternal Grandmother   . Diabetes Maternal Grandmother   . Heart disease Maternal Grandmother 40       MI  . Hypertension Maternal Grandfather   . Throat cancer Maternal Grandfather   . Breast cancer Mother 40  .  Hypertension Father   . Colon cancer Neg Hx     Social History Social History   Tobacco Use  . Smoking status: Former Research scientist (life sciences)  . Smokeless tobacco: Never Used  Substance Use Topics  . Alcohol use: Yes    Comment: occassionally  . Drug use: No     Allergies   Cymbalta [duloxetine hcl]; Nortriptyline; and Phenergan [promethazine hcl]   Review of Systems Review of Systems  Unable to perform ROS: Acuity of condition  Cardiovascular: Positive for chest pain.     Physical Exam Updated Vital Signs BP 136/70 (BP Location: Right Arm)   Pulse 66   Temp 97.7 F (36.5 C) (Oral)   Resp 20   Ht 5\' 2"  (1.575 m)   Wt 77.3 kg   SpO2 100%   BMI 31.17 kg/m   Physical Exam Vitals signs and nursing note reviewed.  Constitutional:      General: She is not in acute distress.    Appearance: She is well-developed. She is ill-appearing and diaphoretic.     Comments: Pale, somnolent, responds to some questions  HENT:     Head: Normocephalic and atraumatic.     Mouth/Throat:     Pharynx: No oropharyngeal exudate.  Eyes:     Conjunctiva/sclera: Conjunctivae normal.     Pupils: Pupils are equal, round, and reactive to light.  Neck:     Musculoskeletal: Normal range of motion and neck supple.     Comments: No meningismus. Cardiovascular:     Rate and Rhythm: Normal rate and regular rhythm.     Heart sounds: Normal heart sounds. No murmur.  Pulmonary:     Effort: Pulmonary effort is normal. No respiratory distress.     Breath sounds: Normal breath sounds.  Chest:     Chest wall: No tenderness.  Abdominal:     Palpations: Abdomen is soft.     Tenderness: There is no abdominal tenderness. There is no guarding or rebound.     Comments: Right upper quadrant scar well-healed.  Soft, mild diffuse tenderness  Equal femoral pulses  Genitourinary:    Comments: No gross blood Musculoskeletal: Normal range of motion.        General: No tenderness.  Skin:    General: Skin is warm.      Capillary Refill: Capillary refill takes less than 2 seconds.  Neurological:     Mental Status: She is alert and oriented to person, place, and time.     Cranial Nerves: No cranial nerve deficit.     Motor: No abnormal muscle tone.     Coordination: Coordination normal.     Comments:  5/5 strength throughout. CN 2-12 intact.Equal grip strength.   Psychiatric:        Behavior: Behavior normal.  ED Treatments / Results  Labs (all labs ordered are listed, but only abnormal results are displayed) Labs Reviewed  CBC WITH DIFFERENTIAL/PLATELET - Abnormal; Notable for the following components:      Result Value   Lymphs Abs 5.4 (*)    All other components within normal limits  COMPREHENSIVE METABOLIC PANEL - Abnormal; Notable for the following components:   Glucose, Bld 110 (*)    Total Protein 6.3 (*)    All other components within normal limits  CBG MONITORING, ED - Abnormal; Notable for the following components:   Glucose-Capillary 106 (*)    All other components within normal limits  I-STAT CHEM 8, ED - Abnormal; Notable for the following components:   Glucose, Bld 109 (*)    All other components within normal limits  TROPONIN I  LIPASE, BLOOD  I-STAT CG4 LACTIC ACID, ED  I-STAT TROPONIN, ED  POC OCCULT BLOOD, ED    EKG EKG Interpretation  Date/Time:  Monday December 24 2018 04:38:01 EST Ventricular Rate:  67 PR Interval:    QRS Duration: 87 QT Interval:  416 QTC Calculation: 440 R Axis:   41 Text Interpretation:  Sinus rhythm Borderline low voltage, extremity leads Abnormal R-wave progression, early transition T wave inversion v1 and v2  Confirmed by Ezequiel Essex [phone]) on 12/24/2018 4:47:01 AM   Radiology Dg Chest Portable 1 View  Result Date: 12/24/2018 CLINICAL DATA:  Chest pain EXAM: PORTABLE CHEST 1 VIEW COMPARISON:  10/10/2018 FINDINGS: Artifact from EKG leads. Interstitial coarsening similar to priors. There is no edema, consolidation, effusion, or  pneumothorax. Normal heart size and mediastinal contours. IMPRESSION: Stable compared to prior.  No acute finding. Electronically Signed   By: Monte Fantasia M.D.   On: 12/24/2018 05:29   Ct Angio Chest/abd/pel For Dissection W And/or Wo Contrast  Result Date: 12/24/2018 CLINICAL DATA:  Chest pain for 1 day with shortness of breath EXAM: CT ANGIOGRAPHY CHEST, ABDOMEN AND PELVIS TECHNIQUE: Multidetector CT imaging through the chest, abdomen and pelvis was performed using the standard protocol during bolus administration of intravenous contrast. Multiplanar reconstructed images and MIPs were obtained and reviewed to evaluate the vascular anatomy. CONTRAST:  139mL ISOVUE-370 COMPARISON:  Plain film from earlier in the same day. FINDINGS: CTA CHEST FINDINGS Cardiovascular: Thoracic aorta shows a normal branching pattern without aneurysmal dilatation or dissection. No cardiac enlargement is noted. No significant coronary calcifications are seen. Pulmonary artery is within normal limits although not timed for pulmonary embolism evaluation. Mediastinum/Nodes: Thoracic inlet is within normal limits. No hilar or mediastinal adenopathy is seen. The esophagus is within normal limits. Lungs/Pleura: Lungs are clear. No pleural effusion or pneumothorax. Musculoskeletal: Mild degenerative changes of the thoracic spine are noted. No acute bony abnormality is seen. Review of the MIP images confirms the above findings. CTA ABDOMEN AND PELVIS FINDINGS VASCULAR Aorta: Normal caliber aorta without aneurysm, dissection, vasculitis or significant stenosis. Celiac: Patent without evidence of aneurysm, dissection, vasculitis or significant stenosis. SMA: Patent without evidence of aneurysm, dissection, vasculitis or significant stenosis. Renals: Both renal arteries are patent without evidence of aneurysm, dissection, vasculitis, fibromuscular dysplasia or significant stenosis. IMA: Patent without evidence of aneurysm, dissection,  vasculitis or significant stenosis. Iliacs: Patent without evidence of aneurysm, dissection, vasculitis or significant stenosis. Veins: Left retroaortic renal vein is noted. No other venous abnormality is seen. Review of the MIP images confirms the above findings. NON-VASCULAR Hepatobiliary: No focal liver abnormality is seen. Status post cholecystectomy. No biliary dilatation. Pancreas: Unremarkable. No pancreatic ductal  dilatation or surrounding inflammatory changes. Spleen: Normal in size without focal abnormality. Adrenals/Urinary Tract: Right adrenal gland is within normal limits. Left adrenal gland demonstrates a 13 mm slightly hypodense lesion likely representing a small adenoma. Stomach/Bowel: Stomach is within normal limits. Appendix appears normal. No evidence of bowel wall thickening, distention, or inflammatory changes. Lymphatic: No significant vascular findings are present. No enlarged abdominal or pelvic lymph nodes. Reproductive: Uterus has been surgically removed. No adnexal mass is noted. Other: Minimal free fluid is noted within the pelvis likely physiologic in nature. Musculoskeletal: No acute or significant osseous findings. Review of the MIP images confirms the above findings. IMPRESSION: No evidence of aortic dissection or pulmonary embolism. 13 mm left adrenal lesion likely representing an adenoma. Consideration for follow-up adrenal CT in 12 months is recommended. This recommendation follows ACR consensus guidelines: Management of Incidental Adrenal Masses: A White Paper of the ACR Incidental Findings Committee. J Am Coll Radiol 2017;14:1038-1044. Electronically Signed   By: Inez Catalina M.D.   On: 12/24/2018 07:24    Procedures Procedures (including critical care time)  Medications Ordered in ED Medications  ondansetron (ZOFRAN) injection 4 mg (4 mg Intravenous Given 12/24/18 0447)     Initial Impression / Assessment and Plan / ED Course  I have reviewed the triage vital signs  and the nursing notes.  Pertinent labs & imaging results that were available during my care of the patient were reviewed by me and considered in my medical decision making (see chart for details).    Acute onset of chest pain, abdominal pain and back pain.  EKG without ST elevation.  Patient appears pale and slow to respond.  Vitals are stable.  EKG without ST elevation.  Labs obtained and patient taken for emergent CT scan to rule out aortic dissection.  No evidence of aortic dissection or pulmonary embolism.  Troponin negative.  LFTs and lipase normal.  Patient now responding more and states that she woke up with nausea and dry heaving.  After this she developed pressure and pain in her chest and abdomen and back.  This is starting to improve.  Her work-up is reassuring thus far.  Plan observation admission given acuity of her presentation as well as to rule out MI.  D/w Dr. Darrick Meigs and Dr. Cruzita Lederer.   CRITICAL CARE Performed by: Ezequiel Essex Total critical care time: 35 minutes Critical care time was exclusive of separately billable procedures and treating other patients. Critical care was necessary to treat or prevent imminent or life-threatening deterioration. Critical care was time spent personally by me on the following activities: development of treatment plan with patient and/or surrogate as well as nursing, discussions with consultants, evaluation of patient's response to treatment, examination of patient, obtaining history from patient or surrogate, ordering and performing treatments and interventions, ordering and review of laboratory studies, ordering and review of radiographic studies, pulse oximetry and re-evaluation of patient's condition.   Final Clinical Impressions(s) / ED Diagnoses   Final diagnoses:  Nonspecific chest pain  Nausea and vomiting, intractability of vomiting not specified, unspecified vomiting type    ED Discharge Orders    None       Valarie Farace,  Annie Main, MD 12/24/18 4742    Ezequiel Essex, MD 12/24/18 [phone]

## 2018-12-25 ENCOUNTER — Ambulatory Visit: Payer: Self-pay | Admitting: Pulmonary Disease

## 2018-12-25 DIAGNOSIS — G43809 Other migraine, not intractable, without status migrainosus: Secondary | ICD-10-CM | POA: Diagnosis not present

## 2018-12-25 DIAGNOSIS — R079 Chest pain, unspecified: Secondary | ICD-10-CM | POA: Diagnosis not present

## 2018-12-25 DIAGNOSIS — Z79899 Other long term (current) drug therapy: Secondary | ICD-10-CM | POA: Diagnosis not present

## 2018-12-25 DIAGNOSIS — R0789 Other chest pain: Secondary | ICD-10-CM | POA: Diagnosis not present

## 2018-12-25 DIAGNOSIS — R112 Nausea with vomiting, unspecified: Secondary | ICD-10-CM | POA: Diagnosis not present

## 2018-12-25 DIAGNOSIS — Z87891 Personal history of nicotine dependence: Secondary | ICD-10-CM | POA: Diagnosis not present

## 2018-12-25 LAB — COMPREHENSIVE METABOLIC PANEL
ALK PHOS: 62 U/L (ref 38–126)
ALT: 24 U/L (ref 0–44)
AST: 17 U/L (ref 15–41)
Albumin: 3.5 g/dL (ref 3.5–5.0)
Anion gap: 7 (ref 5–15)
BILIRUBIN TOTAL: 0.3 mg/dL (ref 0.3–1.2)
BUN: 8 mg/dL (ref 6–20)
CALCIUM: 9.3 mg/dL (ref 8.9–10.3)
CO2: 26 mmol/L (ref 22–32)
CREATININE: 0.62 mg/dL (ref 0.44–1.00)
Chloride: 108 mmol/L (ref 98–111)
GFR calc Af Amer: >60 mL/min (ref >60)
GFR calc non Af Amer: >60 mL/min (ref >60)
Glucose, Bld: 102 mg/dL — ABNORMAL HIGH (ref 70–99)
Potassium: 4 mmol/L (ref 3.5–5.1)
Sodium: 141 mmol/L (ref 135–145)
Total Protein: 6 g/dL — ABNORMAL LOW (ref 6.5–8.1)

## 2018-12-25 LAB — CBC
HCT: 39.8 % (ref 36.0–46.0)
Hemoglobin: 13 g/dL (ref 12.0–15.0)
MCH: 29.5 pg (ref 26.0–34.0)
MCHC: 32.7 g/dL (ref 30.0–36.0)
MCV: 90.5 fL (ref 80.0–100.0)
PLATELETS: 244 10*3/uL (ref 150–400)
RBC: 4.4 MIL/uL (ref 3.87–5.11)
RDW: 12.8 % (ref 11.5–15.5)
WBC: 7.2 10*3/uL (ref 4.0–10.5)
nRBC: 0 % (ref 0.0–0.2)

## 2018-12-25 MED ORDER — SUCRALFATE 1 GM/10ML PO SUSP: 1.0000 g | Freq: Three times a day (TID) | ORAL | 0 refills | Status: DC

## 2018-12-25 NOTE — Discharge Instructions (Signed)
Follow with Mikey Kirschner, MD in 5-7 days  Please get a complete blood count and chemistry panel checked by your Primary MD at your next visit, and again as instructed by your Primary MD. Please get your medications reviewed and adjusted by your Primary MD.  Please request your Primary MD to go over all Hospital Tests and Procedure/Radiological results at the follow up, please get all Hospital records sent to your Prim MD by signing hospital release before you go home.  If you had Pneumonia of Lung problems at the Hospital: Please get a 2 view Chest X ray done in 6-8 weeks after hospital discharge or sooner if instructed by your Primary MD.  If you have Congestive Heart Failure: Please call your Cardiologist or Primary MD anytime you have any of the following symptoms:  1) 3 pound weight gain in 24 hours or 5 pounds in 1 week  2) shortness of breath, with or without a dry hacking cough  3) swelling in the hands, feet or stomach  4) if you have to sleep on extra pillows at night in order to breathe  Follow cardiac low salt diet and 1.5 lit/day fluid restriction.  If you have diabetes Accuchecks 4 times/day, Once in AM empty stomach and then before each meal. Log in all results and show them to your primary doctor at your next visit. If any glucose reading is under 80 or above 300 call your primary MD immediately.  If you have Seizure/Convulsions/Epilepsy: Please do not drive, operate heavy machinery, participate in activities at heights or participate in high speed sports until you have seen by Primary MD or a Neurologist and advised to do so again.  If you had Gastrointestinal Bleeding: Please ask your Primary MD to check a complete blood count within one week of discharge or at your next visit. Your endoscopic/colonoscopic biopsies that are pending at the time of discharge, will also need to followed by your Primary MD.  Get Medicines reviewed and adjusted. Please take all your  medications with you for your next visit with your Primary MD  Please request your Primary MD to go over all hospital tests and procedure/radiological results at the follow up, please ask your Primary MD to get all Hospital records sent to his/her office.  If you experience worsening of your admission symptoms, develop shortness of breath, life threatening emergency, suicidal or homicidal thoughts you must seek medical attention immediately by calling 911 or calling your MD immediately  if symptoms less severe.  You must read complete instructions/literature along with all the possible adverse reactions/side effects for all the Medicines you take and that have been prescribed to you. Take any new Medicines after you have completely understood and accpet all the possible adverse reactions/side effects.   Do not drive or operate heavy machinery when taking Pain medications.   Do not take more than prescribed Pain, Sleep and Anxiety Medications  Special Instructions: If you have smoked or chewed Tobacco  in the last 2 yrs please stop smoking, stop any regular Alcohol  and or any Recreational drug use.  Wear Seat belts while driving.  Please note You were cared for by a hospitalist during your hospital stay. If you have any questions about your discharge medications or the care you received while you were in the hospital after you are discharged, you can call the unit and asked to speak with the hospitalist on call if the hospitalist that took care of you is not available.  Once you are discharged, your primary care physician will handle any further medical issues. Please note that NO REFILLS for any discharge medications will be authorized once you are discharged, as it is imperative that you return to your primary care physician (or establish a relationship with a primary care physician if you do not have one) for your aftercare needs so that they can reassess your need for medications and monitor your  lab values.  You can reach the hospitalist office at phone [phone] or fax [phone]   If you do not have a primary care physician, you can call [phone] for a physician referral.  Activity: As tolerated with Full fall precautions use walker/cane & assistance as needed  Diet: regular  Disposition Home

## 2018-12-25 NOTE — Discharge Summary (Signed)
Physician Discharge Summary  [name] HQI:[id number] DOB: 1966-06-06 DOA: 12/24/2018  PCP: Mikey Kirschner, MD  Admit date: 12/24/2018 Discharge date: 12/25/2018  Admitted From: home Disposition:  Home   Recommendations for Outpatient Follow-up:  1. Follow up with PCP in 1-2 weeks  Home Health: none  Equipment/Devices: none  Discharge Condition: stable  CODE STATUS: Full code Diet recommendation: regular  HPI: Per admitting MD, Nichole Brown is a 53 y.o. female with medical history significant of migraine headaches, depression, anxiety, presents to the hospital with chief complaint of chest pain upper epigastric pain.  Patient reports that she was in her normal state of health up until earlier today when she all of a sudden experienced severe excruciating midsternal chest pain as well as upper epigastric pain, going into her back, and she became so weak that she could not move.  Family was nearby and they report that she was extremely weak and extremely pale appearing.  They checked her pulse and felt that she was bradycardic with a heart rate of 48.  On arrival to the ED her vital signs are normal.  Patient currently feels a little bit better, less pain, and tells me that she was also having nausea.  Denies vomiting.  She tells me that she had few episodes of loose stools yesterday but none today.  Her chest pain was also associated with mild shortness of breath.  She denies any migraine headaches currently. She also has noticed significant increase in burping since this morning  ED Course: In the ED her vital signs are stable, she is normotensive, she underwent a CT angiogram of the chest abdomen and pelvis which was unremarkable except for a left adrenal adenoma which needs to be follow-up with a CT in 12 months.  We are asked to admit for chest pain rule out  Hospital Course: Principal problem: Typical chest pain-patient was admitted to the hospital with sudden onset of  chest pain, radiating to her back.  She presented to the ED.  Patient has no known cardiac history, and she in fact underwent a stress test in 2018 which was unremarkable.  She underwent a CT angiogram of the chest abdomen pelvis in the ED which was negative.  Her symptoms were also associated with epigastric abdominal pain as well as increased reflux, suggesting a GI etiology.  She was admitted to the hospital for chest pain rule out, her cardiac enzymes have remained negative, she also underwent a 2D echo which was unremarkable (full results below).  She was placed on PPI as well as Carafate with improvement in her symptoms.  On the day of discharge, she was chest pain-free, able to tolerate a regular diet, ambulating in the hallway without difficulties.  She will be discharged home in stable condition, her chest pain is most likely GI in origin,?  Esophageal spasm given severity and sudden onset.  She will be given Carafate on discharge  Active Problems ?  Reported bradycardia at home -Heart rate is in the 50s in the ED but she is not hypotensive.  No significant blocks noted on telemetry Migraine headaches -Continue home medications Nausea/dry heaves -Symptomatic management, PPI.  Resolved, able to tolerate a regular diet  Discharge Diagnoses:  Active Problems:   Chest pain  Discharge Instructions  Allergies as of 12/25/2018      Reactions   Cymbalta [duloxetine Hcl] Other (See Comments)   Hallucinations; sleep walking   Nortriptyline Nausea And Vomiting   Phenergan [promethazine  Hcl]    Restless legs and cramping      Medication List    TAKE these medications   AIMOVIG 70 MG/ML Soaj Generic drug:  Erenumab-aooe Inject 70 mg into the skin. Once a month   ondansetron 4 MG disintegrating tablet Commonly known as:  ZOFRAN-ODT Take 1 tablet (4 mg total) by mouth every 8 (eight) hours as needed for nausea or vomiting.   pantoprazole 40 MG tablet Commonly known as:  PROTONIX Take 1  tablet (40 mg total) by mouth daily. For acid reflux What changed:    when to take this  reasons to take this   rizatriptan 10 MG disintegrating tablet Commonly known as:  MAXALT-MLT Take 10 mg by mouth as needed for migraine. May repeat in 2 hours if needed   sucralfate 1 GM/10ML suspension Commonly known as:  CARAFATE Take 10 mLs (1 g total) by mouth 4 (four) times daily -  with meals and at bedtime.   SYSTANE BALANCE 0.6 % Soln Generic drug:  Propylene Glycol Place 1 drop into both eyes daily as needed (dry eyes).   Vitamin D (Ergocalciferol) 1.25 MG (50000 UT) Caps capsule Commonly known as:  DRISDOL Take 50,000 Units by mouth every 7 (seven) days.      Follow-up Information    Mikey Kirschner, MD. Schedule an appointment as soon as possible for a visit in 1 week(s).   Specialty:  Family Medicine Contact information: [address] [place] [address] [phone]           Consultations:  None   Procedures/Studies:  2D echo  Study Conclusions - Left ventricle: The cavity size was normal. Wall thickness was normal. Systolic function was normal. The estimated ejection fraction was in the range of 60% to 65%. Wall motion was normal; there were no regional wall motion abnormalities. Doppler parameters are consistent with abnormal left ventricular relaxation (grade 1 diastolic dysfunction). - Mitral valve: Mildly thickened leaflets. - Right atrium: Central venous pressure (est): 3 mm Hg. - Atrial septum: No defect or patent foramen ovale was identified. - Tricuspid valve: There was trivial regurgitation. - Pulmonary arteries: Systolic pressure could not be accurately estimated. - Pericardium, extracardiac: A trivial pericardial effusion was identified.   Dg Chest Portable 1 View  Result Date: 12/24/2018 CLINICAL DATA:  Chest pain EXAM: PORTABLE CHEST 1 VIEW COMPARISON:  10/10/2018 FINDINGS: Artifact from EKG leads. Interstitial coarsening similar  to priors. There is no edema, consolidation, effusion, or pneumothorax. Normal heart size and mediastinal contours. IMPRESSION: Stable compared to prior.  No acute finding. Electronically Signed   By: Monte Fantasia M.D.   On: 12/24/2018 05:29   Ct Angio Chest/abd/pel For Dissection W And/or Wo Contrast  Result Date: 12/24/2018 CLINICAL DATA:  Chest pain for 1 day with shortness of breath EXAM: CT ANGIOGRAPHY CHEST, ABDOMEN AND PELVIS TECHNIQUE: Multidetector CT imaging through the chest, abdomen and pelvis was performed using the standard protocol during bolus administration of intravenous contrast. Multiplanar reconstructed images and MIPs were obtained and reviewed to evaluate the vascular anatomy. CONTRAST:  167mL ISOVUE-370 COMPARISON:  Plain film from earlier in the same day. FINDINGS: CTA CHEST FINDINGS Cardiovascular: Thoracic aorta shows a normal branching pattern without aneurysmal dilatation or dissection. No cardiac enlargement is noted. No significant coronary calcifications are seen. Pulmonary artery is within normal limits although not timed for pulmonary embolism evaluation. Mediastinum/Nodes: Thoracic inlet is within normal limits. No hilar or mediastinal adenopathy is seen. The esophagus is within  normal limits. Lungs/Pleura: Lungs are clear. No pleural effusion or pneumothorax. Musculoskeletal: Mild degenerative changes of the thoracic spine are noted. No acute bony abnormality is seen. Review of the MIP images confirms the above findings. CTA ABDOMEN AND PELVIS FINDINGS VASCULAR Aorta: Normal caliber aorta without aneurysm, dissection, vasculitis or significant stenosis. Celiac: Patent without evidence of aneurysm, dissection, vasculitis or significant stenosis. SMA: Patent without evidence of aneurysm, dissection, vasculitis or significant stenosis. Renals: Both renal arteries are patent without evidence of aneurysm, dissection, vasculitis, fibromuscular dysplasia or significant stenosis.  IMA: Patent without evidence of aneurysm, dissection, vasculitis or significant stenosis. Iliacs: Patent without evidence of aneurysm, dissection, vasculitis or significant stenosis. Veins: Left retroaortic renal vein is noted. No other venous abnormality is seen. Review of the MIP images confirms the above findings. NON-VASCULAR Hepatobiliary: No focal liver abnormality is seen. Status post cholecystectomy. No biliary dilatation. Pancreas: Unremarkable. No pancreatic ductal dilatation or surrounding inflammatory changes. Spleen: Normal in size without focal abnormality. Adrenals/Urinary Tract: Right adrenal gland is within normal limits. Left adrenal gland demonstrates a 13 mm slightly hypodense lesion likely representing a small adenoma. Stomach/Bowel: Stomach is within normal limits. Appendix appears normal. No evidence of bowel wall thickening, distention, or inflammatory changes. Lymphatic: No significant vascular findings are present. No enlarged abdominal or pelvic lymph nodes. Reproductive: Uterus has been surgically removed. No adnexal mass is noted. Other: Minimal free fluid is noted within the pelvis likely physiologic in nature. Musculoskeletal: No acute or significant osseous findings. Review of the MIP images confirms the above findings. IMPRESSION: No evidence of aortic dissection or pulmonary embolism. 13 mm left adrenal lesion likely representing an adenoma. Consideration for follow-up adrenal CT in 12 months is recommended. This recommendation follows ACR consensus guidelines: Management of Incidental Adrenal Masses: A White Paper of the ACR Incidental Findings Committee. J Am Coll Radiol 2017;14:1038-1044. Electronically Signed   By: Inez Catalina M.D.   On: 12/24/2018 07:24      Subjective: - no chest pain, shortness of breath, no abdominal pain, nausea or vomiting.   Discharge Exam: Vitals:   12/24/18 2139 12/25/18 0614  BP: 106/76 115/71  Pulse: 65 66  Resp: 16 16  Temp: 98 F  (36.7 C) 98.6 F (37 C)  SpO2: 98% 98%    General: Pt is alert, awake, not in acute distress Cardiovascular: RRR, S1/S2 +, no rubs, no gallops Respiratory: CTA bilaterally, no wheezing, no rhonchi Abdominal: Soft, NT, ND, bowel sounds + Extremities: no edema, no cyanosis    The results of significant diagnostics from this hospitalization (including imaging, microbiology, ancillary and laboratory) are listed below for reference.     Microbiology: No results found for this or any previous visit (from the past 240 hour(s)).   Labs: BNP (last 3 results) No results for input(s): BNP in the last 8760 hours. Basic Metabolic Panel: Recent Labs  Lab 12/24/18 0438 12/24/18 0444 12/25/18 0523  NA 139 140 141  K 3.7 4.0 4.0  CL 110 107 108  CO2 22  --  26  GLUCOSE 110* 109* 102*  BUN 12 12 8   CREATININE 0.64 0.60 0.62  CALCIUM 9.0  --  9.3   Liver Function Tests: Recent Labs  Lab 12/24/18 0438 12/25/18 0523  AST 19 17  ALT 27 24  ALKPHOS 67 62  BILITOT 0.5 0.3  PROT 6.3* 6.0*  ALBUMIN 3.6 3.5   Recent Labs  Lab 12/24/18 0438  LIPASE 27   No results for input(s): AMMONIA in the  last 168 hours. CBC: Recent Labs  Lab 12/24/18 0438 12/24/18 0444 12/25/18 0523  WBC 9.4  --  7.2  NEUTROABS 3.0  --   --   HGB 14.3 15.0 13.0  HCT 44.1 44.0 39.8  MCV 87.8  --  90.5  PLT 280  --  244   Cardiac Enzymes: Recent Labs  Lab 12/24/18 0438 12/24/18 1051 12/24/18 1620 12/24/18 2233  TROPONINI <0.03 <0.03 <0.03 <0.03   BNP: Invalid input(s): POCBNP CBG: Recent Labs  Lab 12/24/18 0439  GLUCAP 106*   D-Dimer No results for input(s): DDIMER in the last 72 hours. Hgb A1c No results for input(s): HGBA1C in the last 72 hours. Lipid Profile No results for input(s): CHOL, HDL, LDLCALC, TRIG, CHOLHDL, LDLDIRECT in the last 72 hours. Thyroid function studies No results for input(s): TSH, T4TOTAL, T3FREE, THYROIDAB in the last 72 hours.  Invalid input(s):  FREET3 Anemia work up No results for input(s): VITAMINB12, FOLATE, FERRITIN, TIBC, IRON, RETICCTPCT in the last 72 hours. Urinalysis    Component Value Date/Time   COLORURINE STRAW (A) 06/12/2017 2214   APPEARANCEUR CLEAR (A) 06/12/2017 2214   LABSPEC 1.004 (L) 06/12/2017 2214   PHURINE 6.0 06/12/2017 2214   GLUCOSEU NEGATIVE 06/12/2017 2214   HGBUR NEGATIVE 06/12/2017 2214   BILIRUBINUR NEGATIVE 06/12/2017 2214   KETONESUR NEGATIVE 06/12/2017 2214   PROTEINUR NEGATIVE 06/12/2017 2214   NITRITE NEGATIVE 06/12/2017 2214   LEUKOCYTESUR NEGATIVE 06/12/2017 2214   Sepsis Labs Invalid input(s): PROCALCITONIN,  WBC,  LACTICIDVEN   Time coordinating discharge: 25 minutes  SIGNED:  Marzetta Board, MD  [facility] 12/25/2018, 11:48 AM

## 2018-12-26 DIAGNOSIS — Z6830 Body mass index (BMI) 30.0-30.9, adult: Secondary | ICD-10-CM | POA: Diagnosis not present

## 2018-12-26 DIAGNOSIS — E669 Obesity, unspecified: Secondary | ICD-10-CM | POA: Diagnosis not present

## 2018-12-26 DIAGNOSIS — G43809 Other migraine, not intractable, without status migrainosus: Secondary | ICD-10-CM | POA: Diagnosis not present

## 2018-12-26 DIAGNOSIS — R0602 Shortness of breath: Secondary | ICD-10-CM | POA: Diagnosis not present

## 2018-12-26 DIAGNOSIS — R768 Other specified abnormal immunological findings in serum: Secondary | ICD-10-CM | POA: Diagnosis not present

## 2019-01-01 ENCOUNTER — Encounter: Payer: Self-pay | Admitting: Family Medicine

## 2019-01-01 ENCOUNTER — Ambulatory Visit (INDEPENDENT_AMBULATORY_CARE_PROVIDER_SITE_OTHER): Payer: 59 | Admitting: Family Medicine

## 2019-01-01 VITALS — BP 108/72 | Ht 62.0 in | Wt 171.0 lb

## 2019-01-01 DIAGNOSIS — F411 Generalized anxiety disorder: Secondary | ICD-10-CM | POA: Diagnosis not present

## 2019-01-01 DIAGNOSIS — F321 Major depressive disorder, single episode, moderate: Secondary | ICD-10-CM

## 2019-01-01 DIAGNOSIS — K219 Gastro-esophageal reflux disease without esophagitis: Secondary | ICD-10-CM

## 2019-01-01 MED ORDER — SUCRALFATE 1 GM/10ML PO SUSP: 1.0000 g | Freq: Three times a day (TID) | ORAL | 0 refills | Status: DC

## 2019-01-01 MED ORDER — BUPROPION HCL ER (SR) 150 MG PO TB12: ORAL_TABLET | ORAL | 3 refills | Status: DC

## 2019-01-01 MED ORDER — PANTOPRAZOLE SODIUM 40 MG PO TBEC: 40.0000 mg | DELAYED_RELEASE_TABLET | Freq: Every day | ORAL | 3 refills | Status: DC

## 2019-01-01 NOTE — Progress Notes (Signed)
   Subjective:    Patient ID: Nichole Brown, female    DOB: Aug 23, 1966, 53 y.o.   MRN: [id number]  HPI  Patient is here today to follow up her recent hospitalization for acid reflux and anxiety.She states she had chest pain on 12/24/2018 and passed out so her husband took her to the hospital where she was told it was acid reflux. She was admitted due to an abnormal ekg she says is what the Ed Dr had told her.  Chest discmfort now better,   Taking protonix a t night,  Patient is currently taking Protonix 40 mg on po Qd for reflux,she is also taking Carafate 1 gm four times daily with meals.  She says she has had some anxiety and was not placed on any medication for this.  She is also taking    Pt having sig anxiety, worries daily, weighs on her, affects her perception of symtoms  Pt has had stress and lost her son tragically two yrs ago  Has taken xanax pos hx of antidpr in the past  Some fatigue at times    Still having some  Challenges with reflux symtoms   Pt stopped aimovig a on her own. Back now better. Pt felt this was casuing spasms  For hospital record.  Reviewed in presence of patient including results studies and test.   Review of Systems  Genitourinary: Negative for flank pain.       Objective:   Physical Exam  Alert and oriented, vitals reviewed and stable, NAD ENT-TM's and ext canals WNL bilat via otoscopic exam Soft palate, tonsils and post pharynx WNL via oropharyngeal exam Neck-symmetric, no masses; thyroid nonpalpable and nontender Pulmonary-no tachypnea or accessory muscle use; Clear without wheezes via auscultation Card--no abnrml murmurs, rhythm reg and rate WNL Carotid pulses symmetric, without bruits       Assessment & Plan:  Impression 1 status post admission for chest pain.  MI ruled out.  Cardiac test within normal limits  2.  Flare of reflux.  Discussed.  Patient to maintain Carafate.  Rationale discussed  3.  Anxiety with  element of grief.  Ongoing.  Patient lost her son 2 years ago.  Experiencing ongoing anxiety and element of grief.  Discussion held regarding medicines will press on and give initiate medication  4.  Pituitary adenoma.  With recommendation to rescan in 1 year.  Will put patient on tickler file.  5.  Migraine headaches.  Ongoing.  Patient frustrated because current therapy not working as well.  Working with specialist  Greater than 50% of this 25 minute face to face visit was spent in counseling and discussion and coordination of care regarding the above diagnosis/diagnosies

## 2019-02-20 ENCOUNTER — Telehealth: Payer: Self-pay | Admitting: Cardiovascular Disease

## 2019-02-20 NOTE — Telephone Encounter (Signed)
Attempted to reach the patient. There was no answer nor voicemail.  

## 2019-02-20 NOTE — Telephone Encounter (Signed)
Patient states she had recent admission to [facility] and wants Dr. Fletcher Anon to review testing/ docs.  Patient declined an appt at this time

## 2019-03-01 NOTE — Telephone Encounter (Signed)
This patient was last evaluated by Dr. Fletcher Anon March 2018. To Dr. Fletcher Anon to review recent [facility] admission as requested by the patient.

## 2019-03-04 NOTE — Telephone Encounter (Signed)
I reviewed her records from the hospitalization in January and everything appeared reassuring including labs, CT of the chest as well as echocardiogram.

## 2019-03-05 NOTE — Telephone Encounter (Signed)
Left a message for the patient to call back.  

## 2019-03-08 NOTE — Telephone Encounter (Signed)
Patient made aware and verbalized her understanding. 

## 2019-03-27 DIAGNOSIS — D1801 Hemangioma of skin and subcutaneous tissue: Secondary | ICD-10-CM | POA: Diagnosis not present

## 2019-03-27 DIAGNOSIS — L570 Actinic keratosis: Secondary | ICD-10-CM | POA: Diagnosis not present

## 2019-03-27 DIAGNOSIS — D2262 Melanocytic nevi of left upper limb, including shoulder: Secondary | ICD-10-CM | POA: Diagnosis not present

## 2019-03-27 DIAGNOSIS — L819 Disorder of pigmentation, unspecified: Secondary | ICD-10-CM | POA: Diagnosis not present

## 2019-03-27 DIAGNOSIS — D485 Neoplasm of uncertain behavior of skin: Secondary | ICD-10-CM | POA: Diagnosis not present

## 2019-03-28 ENCOUNTER — Ambulatory Visit: Payer: 59 | Admitting: Family Medicine

## 2019-04-03 DIAGNOSIS — D485 Neoplasm of uncertain behavior of skin: Secondary | ICD-10-CM | POA: Diagnosis not present

## 2019-04-03 DIAGNOSIS — L989 Disorder of the skin and subcutaneous tissue, unspecified: Secondary | ICD-10-CM | POA: Diagnosis not present

## 2019-06-04 ENCOUNTER — Other Ambulatory Visit (HOSPITAL_COMMUNITY): Payer: Self-pay | Admitting: Family Medicine

## 2019-06-04 DIAGNOSIS — Z1231 Encounter for screening mammogram for malignant neoplasm of breast: Secondary | ICD-10-CM

## 2019-06-10 ENCOUNTER — Other Ambulatory Visit: Payer: Self-pay

## 2019-06-10 ENCOUNTER — Ambulatory Visit (HOSPITAL_COMMUNITY)
Admission: RE | Admit: 2019-06-10 | Discharge: 2019-06-10 | Disposition: A | Discharge status: Home / Self Care | Payer: 59 | Source: Ambulatory Visit | Attending: Family Medicine | Admitting: Family Medicine

## 2019-06-10 DIAGNOSIS — Z1231 Encounter for screening mammogram for malignant neoplasm of breast: Secondary | ICD-10-CM | POA: Diagnosis not present

## 2019-06-21 ENCOUNTER — Telehealth: Payer: Self-pay | Admitting: Family Medicine

## 2019-06-21 NOTE — Telephone Encounter (Signed)
Pt states she is not having any fever, no body aches, no loss of smell or taste, pt is feeling ok overall. Pt states she was going to work but they sent her home. Sneezing and coughing some. Pt states she thinks it is due to allergies but OTC Claritin is not working. Pt also wakes up with "sleep" in eyes in the mornings. Pt had COVID test done at [facility] where she works and got a negative result yesterday. Please advise. Thank you

## 2019-06-21 NOTE — Telephone Encounter (Signed)
Patient works for [employer]. Has Cough, sneeze, sore throat and hoarseness. She had Covid test at [facility] at work and got a negative result yesterday. Work still isn't letting her come back to work because of her symptoms.  She thinks it's just allergies but  OTC Claritin isn't helping.  Wants a suggestion for something else she can take.  [facility]

## 2019-06-21 NOTE — Telephone Encounter (Signed)
Add flonase two sprays ea nost qd, and , add round of sinus abx cefzil 500 bid ten d

## 2019-06-24 MED ORDER — CEFPROZIL 500 MG PO TABS: 500.0000 mg | ORAL_TABLET | Freq: Two times a day (BID) | ORAL | 0 refills | Status: DC

## 2019-06-24 NOTE — Telephone Encounter (Signed)
Prescription sent electronically to pharmacy. Patient notified. 

## 2019-07-19 ENCOUNTER — Other Ambulatory Visit: Payer: Self-pay

## 2019-07-22 ENCOUNTER — Encounter: Payer: Self-pay | Admitting: Family Medicine

## 2019-07-22 ENCOUNTER — Other Ambulatory Visit: Payer: Self-pay

## 2019-07-22 ENCOUNTER — Ambulatory Visit (INDEPENDENT_AMBULATORY_CARE_PROVIDER_SITE_OTHER): Payer: 59 | Admitting: Family Medicine

## 2019-07-22 VITALS — BP 110/72 | Temp 97.0°F | Ht 62.0 in | Wt 170.0 lb

## 2019-07-22 DIAGNOSIS — Z0000 Encounter for general adult medical examination without abnormal findings: Secondary | ICD-10-CM | POA: Diagnosis not present

## 2019-07-22 DIAGNOSIS — Z79899 Other long term (current) drug therapy: Secondary | ICD-10-CM

## 2019-07-22 NOTE — Progress Notes (Signed)
Subjective:    Patient ID: Nichole Brown, female    DOB: 1966/10/21, 53 y.o.   MRN: [id number]  HPI The patient comes in today for a wellness visit.    A review of their health history was completed.  A review of medications was also completed.  Any needed refills; carafate and protonix.   Eating habits: health conscious. Has lost 10 lbs.   Falls/  MVA accidents in past few months: none  Regular exercise:walk at least 5 miles every other day. Sits ups and running in place.   Specialist pt sees on regular basis: neurology at [facility] for migraines. Dr. Elvia Collum.   Preventative health issues were discussed.   Additional concerns: pt declined breast exam. Had mammogram on 06/11/19 that was negative. Declines pelvic exam.  Left shoulder pain since last surgery over one year ago.   Pt had shoulder surg and rehab and soe linited motion which persists  Pain now ongoing  Doing exercises taght by PT    Pt cont'd to have injections post the procedure for inflammatiion   Stopped wellbutrin due to side effects but does not feel she needs any meds at this time.   At [place] host, drawing blood, prior to surgery   Less stress, headaches  Stopped well butrin   ot taking , does nt help   Review of Systems  Constitutional: Negative for activity change, appetite change and fatigue.  HENT: Negative for congestion and rhinorrhea.   Eyes: Negative for discharge.  Respiratory: Negative for cough, chest tightness and wheezing.   Cardiovascular: Negative for chest pain.  Gastrointestinal: Negative for abdominal pain, blood in stool and vomiting.  Endocrine: Negative for polyphagia.  Genitourinary: Negative for difficulty urinating and frequency.  Musculoskeletal: Negative for neck pain.  Skin: Negative for color change.  Allergic/Immunologic: Negative for environmental allergies and food allergies.  Neurological: Negative for weakness and headaches.   Psychiatric/Behavioral: Negative for agitation and behavioral problems.  All other systems reviewed and are negative.      Objective:   Physical Exam Constitutional:      Appearance: She is well-developed.  HENT:     Head: Normocephalic.     Right Ear: External ear normal.     Left Ear: External ear normal.  Eyes:     Pupils: Pupils are equal, round, and reactive to light.  Neck:     Musculoskeletal: Normal range of motion.     Thyroid: No thyromegaly.  Cardiovascular:     Rate and Rhythm: Normal rate and regular rhythm.     Heart sounds: Normal heart sounds. No murmur.  Pulmonary:     Effort: Pulmonary effort is normal. No respiratory distress.     Breath sounds: Normal breath sounds. No wheezing.  Abdominal:     General: Bowel sounds are normal. There is no distension.     Palpations: Abdomen is soft. There is no mass.     Tenderness: There is no abdominal tenderness.  Musculoskeletal: Normal range of motion.        General: No tenderness.  Lymphadenopathy:     Cervical: No cervical adenopathy.  Skin:    General: Skin is warm and dry.  Neurological:     Mental Status: She is alert and oriented to person, place, and time.     Motor: No abnormal muscle tone.  Psychiatric:        Behavior: Behavior normal.           Assessment & Plan:  Impression wellness exam.  Diet discussed.  Exercise discussed.  Mental health discussed patient feels overall she is doing better in that regard.  Working in new job which is much easier on her.  Up-to-date on colonoscopy.  Continues to be frustrated by ongoing shoulder pain and orthopedic concerns sees a specialist for this.  Diet discussed exercise discussed.  Appropriate blood work further recommendations based on results

## 2019-07-23 ENCOUNTER — Other Ambulatory Visit
Admission: RE | Admit: 2019-07-23 | Discharge: 2019-07-23 | Disposition: A | Discharge status: Home / Self Care | Payer: 59 | Source: Ambulatory Visit | Attending: Family Medicine | Admitting: Family Medicine

## 2019-07-23 DIAGNOSIS — Z79899 Other long term (current) drug therapy: Secondary | ICD-10-CM | POA: Insufficient documentation

## 2019-07-23 DIAGNOSIS — Z0000 Encounter for general adult medical examination without abnormal findings: Secondary | ICD-10-CM | POA: Diagnosis not present

## 2019-07-23 LAB — CBC WITH DIFFERENTIAL/PLATELET
Abs Immature Granulocytes: 0.03 10*3/uL (ref 0.00–0.07)
Basophils Absolute: 0.1 10*3/uL (ref 0.0–0.1)
Basophils Relative: 1 %
Eosinophils Absolute: 0.4 10*3/uL (ref 0.0–0.5)
Eosinophils Relative: 5 %
HCT: 39.9 % (ref 36.0–46.0)
Hemoglobin: 13.4 g/dL (ref 12.0–15.0)
Immature Granulocytes: 0 %
Lymphocytes Relative: 45 %
Lymphs Abs: 3.4 10*3/uL (ref 0.7–4.0)
MCH: 29.2 pg (ref 26.0–34.0)
MCHC: 33.6 g/dL (ref 30.0–36.0)
MCV: 86.9 fL (ref 80.0–100.0)
Monocytes Absolute: 0.5 10*3/uL (ref 0.1–1.0)
Monocytes Relative: 7 %
Neutro Abs: 3.2 10*3/uL (ref 1.7–7.7)
Neutrophils Relative %: 42 %
Platelets: 249 10*3/uL (ref 150–400)
RBC: 4.59 MIL/uL (ref 3.87–5.11)
RDW: 13.1 % (ref 11.5–15.5)
WBC: 7.6 10*3/uL (ref 4.0–10.5)
nRBC: 0 % (ref 0.0–0.2)

## 2019-07-23 LAB — HEPATIC FUNCTION PANEL
ALT: 20 U/L (ref 0–44)
AST: 16 U/L (ref 15–41)
Albumin: 3.9 g/dL (ref 3.5–5.0)
Alkaline Phosphatase: 71 U/L (ref 38–126)
Bilirubin, Direct: <0.1 mg/dL (ref 0.0–0.2)
Total Bilirubin: 0.4 mg/dL (ref 0.3–1.2)
Total Protein: 6.5 g/dL (ref 6.5–8.1)

## 2019-07-23 LAB — LIPID PANEL
Cholesterol: 162 mg/dL (ref 0–200)
HDL: 45 mg/dL (ref >40)
LDL Cholesterol: 93 mg/dL (ref 0–99)
Total CHOL/HDL Ratio: 3.6 RATIO
Triglycerides: 122 mg/dL (ref <150)
VLDL: 24 mg/dL (ref 0–40)

## 2019-07-23 LAB — BASIC METABOLIC PANEL
Anion gap: 9 (ref 5–15)
BUN: 13 mg/dL (ref 6–20)
CO2: 23 mmol/L (ref 22–32)
Calcium: 9 mg/dL (ref 8.9–10.3)
Chloride: 108 mmol/L (ref 98–111)
Creatinine, Ser: 0.57 mg/dL (ref 0.44–1.00)
GFR calc Af Amer: >60 mL/min (ref >60)
GFR calc non Af Amer: >60 mL/min (ref >60)
Glucose, Bld: 102 mg/dL — ABNORMAL HIGH (ref 70–99)
Potassium: 3.9 mmol/L (ref 3.5–5.1)
Sodium: 140 mmol/L (ref 135–145)

## 2019-07-24 LAB — VITAMIN D 25 HYDROXY (VIT D DEFICIENCY, FRACTURES): Vit D, 25-Hydroxy: 27.2 ng/mL — ABNORMAL LOW (ref 30.0–100.0)

## 2019-07-25 DIAGNOSIS — G43709 Chronic migraine without aura, not intractable, without status migrainosus: Secondary | ICD-10-CM | POA: Diagnosis not present

## 2019-07-28 ENCOUNTER — Encounter: Payer: Self-pay | Admitting: Family Medicine

## 2019-07-30 ENCOUNTER — Encounter: Payer: Self-pay | Admitting: Family Medicine

## 2019-07-30 NOTE — Telephone Encounter (Signed)
See other mychart message from today. Pt received letter with results after sending this message. She does not need anything at this time.

## 2019-08-26 DIAGNOSIS — M7582 Other shoulder lesions, left shoulder: Secondary | ICD-10-CM | POA: Diagnosis not present

## 2019-08-26 DIAGNOSIS — M25512 Pain in left shoulder: Secondary | ICD-10-CM | POA: Diagnosis not present

## 2019-08-26 DIAGNOSIS — M7522 Bicipital tendinitis, left shoulder: Secondary | ICD-10-CM | POA: Diagnosis not present

## 2019-09-02 ENCOUNTER — Other Ambulatory Visit: Payer: Self-pay | Admitting: Surgery

## 2019-09-02 DIAGNOSIS — M7522 Bicipital tendinitis, left shoulder: Secondary | ICD-10-CM

## 2019-09-02 DIAGNOSIS — M7582 Other shoulder lesions, left shoulder: Secondary | ICD-10-CM

## 2019-09-05 ENCOUNTER — Ambulatory Visit: Admission: RE | Admit: 2019-09-05 | Payer: 59 | Source: Ambulatory Visit

## 2019-09-12 ENCOUNTER — Other Ambulatory Visit: Payer: Self-pay

## 2019-09-12 ENCOUNTER — Ambulatory Visit
Admission: RE | Admit: 2019-09-12 | Discharge: 2019-09-12 | Disposition: A | Discharge status: Home / Self Care | Payer: 59 | Source: Ambulatory Visit | Attending: Surgery | Admitting: Surgery

## 2019-09-12 DIAGNOSIS — M7582 Other shoulder lesions, left shoulder: Secondary | ICD-10-CM | POA: Diagnosis not present

## 2019-09-12 DIAGNOSIS — M7522 Bicipital tendinitis, left shoulder: Secondary | ICD-10-CM | POA: Diagnosis not present

## 2019-09-12 DIAGNOSIS — M19012 Primary osteoarthritis, left shoulder: Secondary | ICD-10-CM | POA: Diagnosis not present

## 2019-09-13 ENCOUNTER — Ambulatory Visit: Admission: RE | Admit: 2019-09-13 | Payer: 59 | Source: Ambulatory Visit

## 2019-09-16 DIAGNOSIS — M7582 Other shoulder lesions, left shoulder: Secondary | ICD-10-CM | POA: Diagnosis not present

## 2019-09-16 DIAGNOSIS — M7522 Bicipital tendinitis, left shoulder: Secondary | ICD-10-CM | POA: Diagnosis not present

## 2019-09-25 ENCOUNTER — Other Ambulatory Visit: Payer: Self-pay

## 2019-09-25 ENCOUNTER — Encounter
Admission: RE | Admit: 2019-09-25 | Discharge: 2019-09-25 | Disposition: A | Discharge status: Home / Self Care | Payer: 59 | Source: Ambulatory Visit | Attending: Surgery | Admitting: Surgery

## 2019-09-25 DIAGNOSIS — Z01818 Encounter for other preprocedural examination: Secondary | ICD-10-CM | POA: Insufficient documentation

## 2019-09-25 DIAGNOSIS — M7522 Bicipital tendinitis, left shoulder: Secondary | ICD-10-CM | POA: Diagnosis not present

## 2019-09-25 NOTE — Patient Instructions (Signed)
Your procedure is scheduled on: Thursday October 03, 2019 Report to Day Surgery on the 2nd floor of the [facility]. To find out your arrival time, please call [phone] between 1PM - 3PM on: October 02, 2019  REMEMBER: Instructions that are not followed completely may result in serious medical risk, up to and including death; or upon the discretion of your surgeon and anesthesiologist your surgery may need to be rescheduled.  Do not eat food after midnight the night before surgery.  No gum chewing, lozengers or hard candies.  You may however, drink CLEAR liquids up to 2 hours before you are scheduled to arrive for your surgery. Do not drink anything within 2 hours of the start of your surgery.  Clear liquids include: - water  - apple juice without pulp - gatorade - black coffee or tea (Do NOT add milk or creamers to the coffee or tea) Do NOT drink anything that is not on this list.  No Alcohol for 24 hours before or after surgery.  No Smoking including e-cigarettes for 24 hours prior to surgery.  No chewable tobacco products for at least 6 hours prior to surgery.  No nicotine patches on the day of surgery.  On the morning of surgery brush your teeth with toothpaste and water, you may rinse your mouth with mouthwash if you wish. Do not swallow any toothpaste or mouthwash.  Notify your doctor if there is any change in your medical condition (cold, fever, infection).  Do not wear jewelry, make-up, hairpins, clips or nail polish.  Do not wear lotions, powders, or perfumes.   Do not shave 48 hours prior to surgery.   Contacts and dentures may not be worn into surgery.  Do not bring valuables to the hospital, including drivers license, insurance or credit cards.  [facility] is not responsible for any belongings or valuables.   TAKE THESE MEDICATIONS THE MORNING OF SURGERY: PANTOPRAZOLE (take one the night before and one on the morning of surgery - helps to prevent nausea  after surgery.)  Use CHG Soap  as directed on instruction sheet.  Stop Anti-inflammatories (NSAIDS) such as Advil, Aleve, Ibuprofen, Motrin, Naproxen, Naprosyn and Aspirin based products such as Excedrin, Goodys Powder, BC Powder. (May take Tylenol or Acetaminophen if needed.)  Stop ANY OVER THE COUNTER supplements until after surgery. (May continue Vitamin D, Vitamin B, and multivitamin.)  Wear comfortable clothing (specific to your surgery type) to the hospital.  Plan for stool softeners for home use.  If you are being admitted to the hospital overnight, leave your suitcase in the car. After surgery it may be brought to your room.  If you are being discharged the day of surgery, you will not be allowed to drive home. You will need a responsible adult to drive you home and stay with you that night.   If you are taking public transportation, you will need to have a responsible adult with you. Please confirm with your physician that it is acceptable to use public transportation.   Please call [phone] if you have any questions about these instructions.

## 2019-09-30 ENCOUNTER — Other Ambulatory Visit
Admission: RE | Admit: 2019-09-30 | Discharge: 2019-09-30 | Disposition: A | Discharge status: Home / Self Care | Payer: 59 | Source: Ambulatory Visit | Attending: Surgery | Admitting: Surgery

## 2019-09-30 ENCOUNTER — Other Ambulatory Visit: Payer: Self-pay

## 2019-09-30 DIAGNOSIS — Z20828 Contact with and (suspected) exposure to other viral communicable diseases: Secondary | ICD-10-CM | POA: Diagnosis not present

## 2019-09-30 DIAGNOSIS — Z01812 Encounter for preprocedural laboratory examination: Secondary | ICD-10-CM | POA: Diagnosis not present

## 2019-10-01 LAB — SARS CORONAVIRUS 2 (TAT 6-24 HRS): SARS Coronavirus 2: NEGATIVE

## 2019-10-02 MED ORDER — CEFAZOLIN SODIUM-DEXTROSE 2-4 GM/100ML-% IV SOLN
2.0000 g | Freq: Once | INTRAVENOUS | Status: AC
  Administered 2019-10-03: 2 g via INTRAVENOUS

## 2019-10-03 ENCOUNTER — Ambulatory Visit: Payer: 59 | Admitting: Anesthesiology

## 2019-10-03 ENCOUNTER — Encounter
Admission: RE | Disposition: A | Discharge status: Home / Self Care | Payer: Self-pay | Source: Home / Self Care | Attending: Surgery

## 2019-10-03 ENCOUNTER — Other Ambulatory Visit: Payer: Self-pay

## 2019-10-03 ENCOUNTER — Ambulatory Visit
Admission: RE | Admit: 2019-10-03 | Discharge: 2019-10-03 | Disposition: A | Discharge status: Home / Self Care | Payer: 59 | Attending: Surgery | Admitting: Surgery

## 2019-10-03 ENCOUNTER — Ambulatory Visit: Payer: 59

## 2019-10-03 DIAGNOSIS — G8918 Other acute postprocedural pain: Secondary | ICD-10-CM | POA: Diagnosis not present

## 2019-10-03 DIAGNOSIS — M7582 Other shoulder lesions, left shoulder: Secondary | ICD-10-CM | POA: Diagnosis not present

## 2019-10-03 DIAGNOSIS — Z87891 Personal history of nicotine dependence: Secondary | ICD-10-CM | POA: Insufficient documentation

## 2019-10-03 DIAGNOSIS — M25512 Pain in left shoulder: Secondary | ICD-10-CM | POA: Diagnosis not present

## 2019-10-03 DIAGNOSIS — S43432A Superior glenoid labrum lesion of left shoulder, initial encounter: Secondary | ICD-10-CM | POA: Diagnosis not present

## 2019-10-03 DIAGNOSIS — F321 Major depressive disorder, single episode, moderate: Secondary | ICD-10-CM | POA: Diagnosis not present

## 2019-10-03 DIAGNOSIS — M7542 Impingement syndrome of left shoulder: Secondary | ICD-10-CM | POA: Diagnosis not present

## 2019-10-03 DIAGNOSIS — M25812 Other specified joint disorders, left shoulder: Secondary | ICD-10-CM | POA: Insufficient documentation

## 2019-10-03 DIAGNOSIS — Z888 Allergy status to other drugs, medicaments and biological substances status: Secondary | ICD-10-CM | POA: Diagnosis not present

## 2019-10-03 DIAGNOSIS — M19012 Primary osteoarthritis, left shoulder: Secondary | ICD-10-CM | POA: Diagnosis not present

## 2019-10-03 DIAGNOSIS — G43909 Migraine, unspecified, not intractable, without status migrainosus: Secondary | ICD-10-CM | POA: Diagnosis not present

## 2019-10-03 DIAGNOSIS — Z79899 Other long term (current) drug therapy: Secondary | ICD-10-CM | POA: Diagnosis not present

## 2019-10-03 DIAGNOSIS — K219 Gastro-esophageal reflux disease without esophagitis: Secondary | ICD-10-CM | POA: Insufficient documentation

## 2019-10-03 HISTORY — DX: Other complications of anesthesia, initial encounter: T88.59XA

## 2019-10-03 HISTORY — PX: SHOULDER ARTHROSCOPY WITH SUBACROMIAL DECOMPRESSION: SHX5684

## 2019-10-03 SURGERY — SHOULDER ARTHROSCOPY WITH SUBACROMIAL DECOMPRESSION
Anesthesia: General | Site: Shoulder | Laterality: Left

## 2019-10-03 MED ORDER — DEXAMETHASONE SODIUM PHOSPHATE 10 MG/ML IJ SOLN
INTRAMUSCULAR | Status: AC
  Filled 2019-10-03: qty 1

## 2019-10-03 MED ORDER — FENTANYL CITRATE (PF) 250 MCG/5ML IJ SOLN
INTRAMUSCULAR | Status: AC
  Filled 2019-10-03: qty 5

## 2019-10-03 MED ORDER — CEFAZOLIN SODIUM-DEXTROSE 2-4 GM/100ML-% IV SOLN
INTRAVENOUS | Status: AC
  Filled 2019-10-03: qty 100

## 2019-10-03 MED ORDER — MIDAZOLAM HCL 2 MG/2ML IJ SOLN
INTRAMUSCULAR | Status: AC
  Filled 2019-10-03: qty 2

## 2019-10-03 MED ORDER — OXYCODONE HCL 5 MG/5ML PO SOLN: 5.0000 mg | Freq: Once | ORAL | Status: DC | PRN

## 2019-10-03 MED ORDER — MIDAZOLAM HCL 2 MG/2ML IJ SOLN: 1.0000 mg | Freq: Once | INTRAMUSCULAR | Status: DC

## 2019-10-03 MED ORDER — BUPIVACAINE HCL (PF) 0.5 % IJ SOLN
INTRAMUSCULAR | Status: AC
  Filled 2019-10-03: qty 30

## 2019-10-03 MED ORDER — DEXAMETHASONE SODIUM PHOSPHATE 4 MG/ML IJ SOLN
INTRAMUSCULAR | Status: DC | PRN
  Administered 2019-10-03: 6 mg via INTRAVENOUS

## 2019-10-03 MED ORDER — FENTANYL CITRATE (PF) 100 MCG/2ML IJ SOLN: 25.0000 ug | INTRAMUSCULAR | Status: DC | PRN

## 2019-10-03 MED ORDER — ONDANSETRON HCL 4 MG/2ML IJ SOLN
INTRAMUSCULAR | Status: DC | PRN
  Administered 2019-10-03: 4 mg via INTRAVENOUS

## 2019-10-03 MED ORDER — SCOPOLAMINE 1 MG/3DAYS TD PT72
MEDICATED_PATCH | TRANSDERMAL | Status: AC
  Administered 2019-10-03: 09:00:00
  Filled 2019-10-03: qty 1

## 2019-10-03 MED ORDER — LIDOCAINE HCL (CARDIAC) PF 100 MG/5ML IV SOSY
PREFILLED_SYRINGE | INTRAVENOUS | Status: DC | PRN
  Administered 2019-10-03: 100 mg via INTRAVENOUS

## 2019-10-03 MED ORDER — LACTATED RINGERS IV SOLN
INTRAVENOUS | Status: DC
  Administered 2019-10-03: 09:00:00 via INTRAVENOUS

## 2019-10-03 MED ORDER — ONDANSETRON HCL 4 MG/2ML IJ SOLN
INTRAMUSCULAR | Status: AC
  Filled 2019-10-03: qty 2

## 2019-10-03 MED ORDER — BUPIVACAINE-EPINEPHRINE (PF) 0.5% -1:200000 IJ SOLN
INTRAMUSCULAR | Status: DC | PRN
  Administered 2019-10-03: 30 mL

## 2019-10-03 MED ORDER — FENTANYL CITRATE (PF) 100 MCG/2ML IJ SOLN
INTRAMUSCULAR | Status: AC
  Filled 2019-10-03: qty 2

## 2019-10-03 MED ORDER — SUGAMMADEX SODIUM 500 MG/5ML IV SOLN
INTRAVENOUS | Status: AC
  Filled 2019-10-03: qty 5

## 2019-10-03 MED ORDER — SODIUM CHLORIDE FLUSH 0.9 % IV SOLN
INTRAVENOUS | Status: AC
  Filled 2019-10-03: qty 10

## 2019-10-03 MED ORDER — BUPIVACAINE HCL (PF) 0.5 % IJ SOLN
INTRAMUSCULAR | Status: DC | PRN
  Administered 2019-10-03: 3 mL via PERINEURAL
  Administered 2019-10-03: 7 mL via PERINEURAL

## 2019-10-03 MED ORDER — LIDOCAINE HCL (PF) 2 % IJ SOLN
INTRAMUSCULAR | Status: AC
  Filled 2019-10-03: qty 20

## 2019-10-03 MED ORDER — LIDOCAINE HCL (PF) 1 % IJ SOLN
INTRAMUSCULAR | Status: AC
  Filled 2019-10-03: qty 5

## 2019-10-03 MED ORDER — OXYCODONE HCL 5 MG PO TABS: 5.0000 mg | ORAL_TABLET | ORAL | 0 refills | Status: DC | PRN

## 2019-10-03 MED ORDER — BUPIVACAINE HCL (PF) 0.5 % IJ SOLN
INTRAMUSCULAR | Status: AC
  Filled 2019-10-03: qty 10

## 2019-10-03 MED ORDER — FENTANYL CITRATE (PF) 100 MCG/2ML IJ SOLN: 50.0000 ug | Freq: Once | INTRAMUSCULAR | Status: DC

## 2019-10-03 MED ORDER — BUPIVACAINE LIPOSOME 1.3 % IJ SUSP
INTRAMUSCULAR | Status: AC
  Filled 2019-10-03: qty 20

## 2019-10-03 MED ORDER — PHENYLEPHRINE HCL-NACL 10-0.9 MG/250ML-% IV SOLN
INTRAVENOUS | Status: DC | PRN
  Administered 2019-10-03: 10 ug/min via INTRAVENOUS

## 2019-10-03 MED ORDER — PROPOFOL 10 MG/ML IV BOLUS
INTRAVENOUS | Status: AC
  Filled 2019-10-03: qty 20

## 2019-10-03 MED ORDER — ROCURONIUM BROMIDE 50 MG/5ML IV SOLN
INTRAVENOUS | Status: AC
  Filled 2019-10-03: qty 2

## 2019-10-03 MED ORDER — SUGAMMADEX SODIUM 200 MG/2ML IV SOLN
INTRAVENOUS | Status: DC | PRN
  Administered 2019-10-03: 306.8 mg via INTRAVENOUS

## 2019-10-03 MED ORDER — ROCURONIUM BROMIDE 100 MG/10ML IV SOLN
INTRAVENOUS | Status: DC | PRN
  Administered 2019-10-03: 80 mg via INTRAVENOUS

## 2019-10-03 MED ORDER — PROPOFOL 10 MG/ML IV BOLUS
INTRAVENOUS | Status: DC | PRN
  Administered 2019-10-03: 170 mg via INTRAVENOUS

## 2019-10-03 MED ORDER — PROMETHAZINE HCL 25 MG/ML IJ SOLN
INTRAMUSCULAR | Status: AC
  Administered 2019-10-03: 6.25 mg via INTRAVENOUS
  Filled 2019-10-03: qty 1

## 2019-10-03 MED ORDER — OXYCODONE HCL 5 MG PO TABS: 5.0000 mg | ORAL_TABLET | Freq: Once | ORAL | Status: DC | PRN

## 2019-10-03 MED ORDER — GLYCOPYRROLATE 0.2 MG/ML IJ SOLN
INTRAMUSCULAR | Status: AC
  Filled 2019-10-03: qty 1

## 2019-10-03 MED ORDER — SCOPOLAMINE 1 MG/3DAYS TD PT72: 1.0000 | MEDICATED_PATCH | TRANSDERMAL | Status: DC

## 2019-10-03 MED ORDER — EPINEPHRINE PF 1 MG/ML IJ SOLN
INTRAMUSCULAR | Status: AC
  Filled 2019-10-03: qty 3

## 2019-10-03 MED ORDER — PROMETHAZINE HCL 25 MG/ML IJ SOLN
6.2500 mg | Freq: Once | INTRAMUSCULAR | Status: AC
  Administered 2019-10-03: 12:00:00 6.25 mg via INTRAVENOUS

## 2019-10-03 MED ORDER — BUPIVACAINE LIPOSOME 1.3 % IJ SUSP
INTRAMUSCULAR | Status: DC | PRN
  Administered 2019-10-03: 13 mL via PERINEURAL
  Administered 2019-10-03: 7 mL via PERINEURAL

## 2019-10-03 SURGICAL SUPPLY — 42 items
APL PRP STRL LF DISP 70% ISPRP (MISCELLANEOUS) ×1
BIT DRILL JUGRKNT W/NDL BIT2.9 (DRILL) IMPLANT
BLADE FULL RADIUS 3.5 (BLADE) ×2 IMPLANT
BUR ACROMIONIZER 4.0 (BURR) ×2 IMPLANT
CANNULA SHAVER 8MMX76MM (CANNULA) ×2 IMPLANT
CHLORAPREP W/TINT 26 (MISCELLANEOUS) ×2 IMPLANT
COVER MAYO STAND REUSABLE (DRAPES) ×2 IMPLANT
COVER WAND RF STERILE (DRAPES) ×2 IMPLANT
DRAPE SPLIT 6X30 W/TAPE (DRAPES) ×4 IMPLANT
DRILL JUGGERKNOT W/NDL BIT 2.9 (DRILL)
ELECT REM PT RETURN 9FT ADLT (ELECTROSURGICAL) ×2
ELECTRODE REM PT RTRN 9FT ADLT (ELECTROSURGICAL) ×1 IMPLANT
GAUZE SPONGE 4X4 12PLY STRL (GAUZE/BANDAGES/DRESSINGS) ×2 IMPLANT
GAUZE XEROFORM 1X8 LF (GAUZE/BANDAGES/DRESSINGS) ×2 IMPLANT
GLOVE BIO SURGEON STRL SZ7.5 (GLOVE) ×4 IMPLANT
GLOVE BIO SURGEON STRL SZ8 (GLOVE) ×4 IMPLANT
GLOVE BIOGEL PI IND STRL 8 (GLOVE) ×1 IMPLANT
GLOVE BIOGEL PI INDICATOR 8 (GLOVE) ×1
GLOVE INDICATOR 8.0 STRL GRN (GLOVE) ×2 IMPLANT
GOWN STRL REUS W/ TWL LRG LVL3 (GOWN DISPOSABLE) ×1 IMPLANT
GOWN STRL REUS W/ TWL XL LVL3 (GOWN DISPOSABLE) ×1 IMPLANT
GOWN STRL REUS W/TWL LRG LVL3 (GOWN DISPOSABLE) ×2
GOWN STRL REUS W/TWL XL LVL3 (GOWN DISPOSABLE) ×2
GRASPER SUT 15 45D LOW PRO (SUTURE) IMPLANT
IV LACTATED RINGER IRRG 3000ML (IV SOLUTION) ×2
IV LR IRRIG 3000ML ARTHROMATIC (IV SOLUTION) ×1 IMPLANT
MANIFOLD NEPTUNE II (INSTRUMENTS) ×2 IMPLANT
MASK FACE SPIDER DISP (MASK) ×2 IMPLANT
MAT ABSORB  FLUID 56X50 GRAY (MISCELLANEOUS) ×1
MAT ABSORB FLUID 56X50 GRAY (MISCELLANEOUS) ×1 IMPLANT
NEEDLE HYPO 22GX1.5 SAFETY (NEEDLE) ×2 IMPLANT
PACK ARTHROSCOPY SHOULDER (MISCELLANEOUS) ×2 IMPLANT
SLING ARM LRG DEEP (SOFTGOODS) ×2 IMPLANT
SLING ULTRA II LG (MISCELLANEOUS) ×2 IMPLANT
STAPLER SKIN PROX 35W (STAPLE) ×2 IMPLANT
STRAP SAFETY 5IN WIDE (MISCELLANEOUS) ×2 IMPLANT
SUT ETHIBOND 0 MO6 C/R (SUTURE) ×2 IMPLANT
SUT VIC AB 2-0 CT1 27 (SUTURE) ×4
SUT VIC AB 2-0 CT1 TAPERPNT 27 (SUTURE) ×2 IMPLANT
TAPE MICROFOAM 4IN (TAPE) ×2 IMPLANT
TUBING ARTHRO INFLOW-ONLY STRL (TUBING) ×2 IMPLANT
WAND WEREWOLF FLOW 90D (MISCELLANEOUS) ×2 IMPLANT

## 2019-10-03 NOTE — Anesthesia Preprocedure Evaluation (Signed)
Anesthesia Evaluation  Patient identified by MRN, date of birth, ID band Patient awake    Reviewed: Allergy & Precautions, H&P , NPO status , Patient's Chart, lab work & pertinent test results  History of Anesthesia Complications (+) PONV and history of anesthetic complications  Airway Mallampati: III  TM Distance: >3 FB Neck ROM: full    Dental  (+) Chipped   Pulmonary neg shortness of breath, pneumonia, former smoker,           Cardiovascular Exercise Tolerance: Good (-) angina(-) Past MI and (-) DOE negative cardio ROS       Neuro/Psych  Headaches, PSYCHIATRIC DISORDERS  Neuromuscular disease    GI/Hepatic Neg liver ROS, GERD  Medicated and Controlled,  Endo/Other  negative endocrine ROS  Renal/GU      Musculoskeletal  (+) Arthritis , Fibromyalgia -  Abdominal   Peds  Hematology negative hematology ROS (+)   Anesthesia Other Findings Past Medical History: No date: Anxiety No date: Bronchitis No date: Complication of anesthesia     Comment:  PONV No date: Depression 2005: Fibromyalgia No date: GERD (gastroesophageal reflux disease) No date: Insomnia No date: Migraine headache No date: Pneumonia  Past Surgical History: No date: ABDOMINAL HYSTERECTOMY No date: CESAREAN SECTION 1988: CHOLECYSTECTOMY 12/10/2018: COLONOSCOPY; N/A     Comment:  Procedure: COLONOSCOPY;  Surgeon: Danie Binder, MD;                [facility];  Service: Endoscopy;                Laterality: N/A;  10:30am 1967: open heart surgery 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 12/10/2018: POLYPECTOMY     Comment:  Procedure: POLYPECTOMY;  Surgeon: Danie Binder, MD;                Location: [facility];  Service: Endoscopy;;  hepatic               flexure, descending, transverse 06/16/2017: SHOULDER ARTHROSCOPY; Left     Comment:  Procedure: LEFT SHOULDER ARTHROSCOPY, DEBRIDEMENT, AND               DECOMPRESSION;   Surgeon: Newt Minion, MD;  Location:               [place];  Service: Orthopedics;  Laterality: Left; 2008: SHOULDER SURGERY; Left     Comment:  AC joint  BMI    Body Mass Index: 30.93 kg/m      Reproductive/Obstetrics negative OB ROS                             Anesthesia Physical Anesthesia Plan  ASA: III  Anesthesia Plan: General ETT   Post-op Pain Management: GA combined w/ Regional for post-op pain   Induction: Intravenous  PONV Risk Score and Plan: Ondansetron, Dexamethasone, Midazolam and Treatment may vary due to age or medical condition  Airway Management Planned: Oral ETT  Additional Equipment:   Intra-op Plan:   Post-operative Plan: Extubation in OR  Informed Consent: I have reviewed the patients History and Physical, chart, labs and discussed the procedure including the risks, benefits and alternatives for the proposed anesthesia with the patient or authorized representative who has indicated his/her understanding and acceptance.     Dental Advisory Given  Plan Discussed with: Anesthesiologist, CRNA and Surgeon  Anesthesia Plan Comments: (Patient consented for risks of anesthesia including but not limited to:  -  adverse reactions to medications - damage to teeth, lips or other oral mucosa - sore throat or hoarseness - Damage to heart, brain, lungs or loss of life  Patient voiced understanding.)        Anesthesia Quick Evaluation

## 2019-10-03 NOTE — Anesthesia Post-op Follow-up Note (Signed)
Anesthesia QCDR form completed.        

## 2019-10-03 NOTE — Transfer of Care (Signed)
Immediate Anesthesia Transfer of Care Note  Patient: Nichole Brown  Procedure(s) Performed: SHOULDER ARTHROSCOPY WITH DEBRIDEMENT, DECOMPRESSION AND PROBABLE  BICEPS TENODESIS (Left Shoulder)  Patient Location: PACU  Anesthesia Type:General  Level of Consciousness: awake, alert  and oriented  Airway & Oxygen Therapy: Patient Spontanous Breathing and Patient connected to face mask oxygen  Post-op Assessment: Report given to RN and Post -op Vital signs reviewed and stable  Post vital signs: Reviewed and stable  Last Vitals:  Vitals Value Taken Time  BP 92/61 10/03/19 1155  Temp    Pulse 71 10/03/19 1158  Resp 15 10/03/19 1158  SpO2 100 % 10/03/19 1158  Vitals shown include unvalidated device data.  Last Pain:  Vitals:   10/03/19 0841  TempSrc: Tympanic         Complications: No apparent anesthesia complications

## 2019-10-03 NOTE — Anesthesia Procedure Notes (Signed)
Anesthesia Regional Block: Interscalene brachial plexus block   Pre-Anesthetic Checklist: ,, timeout performed, Correct Patient, Correct Site, Correct Laterality, Correct Procedure, Correct Position, site marked, Risks and benefits discussed,  Surgical consent,  Pre-op evaluation,  At surgeon's request and post-op pain management  Laterality: Upper and Left  Prep: chloraprep       Needles:  Injection technique: Single-shot  Needle Type: Stimiplex     Needle Length: 5cm  Needle Gauge: 22     Additional Needles:   Procedures:,,,, ultrasound used (permanent image in chart),,,,  Narrative:  Start time: 10/03/2019 9:25 AM End time: 10/03/2019 9:28 AM Injection made incrementally with aspirations every 5 mL.  Performed by: Personally  Anesthesiologist: Marquiz Sotelo, Precious Haws, MD  Additional Notes: Patient consented for risk and benefits of nerve block including but not limited to nerve damage, failed block, bleeding and infection.  Patient voiced understanding.  Functioning IV was confirmed and monitors were applied.  A 24mm 22ga Stimuplex needle was used. Sterile prep,hand hygiene and sterile gloves were used.  Minimal sedation used for procedure.  No paresthesia endorsed by patient during the procedure.  Negative aspiration and negative test dose prior to incremental administration of local anesthetic. The patient tolerated the procedure well with no immediate complications.

## 2019-10-03 NOTE — Op Note (Signed)
10/03/2019  11:50 AM  Patient:   Nichole Brown  Pre-Op Diagnosis:   Impingement/tendinopathy with possible biceps tendinopathy, left shoulder.  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  Procedure:   Extensive arthroscopic debridement and arthroscopic subacromial decompression, left shoulder.  Anesthesia:   General endotracheal with interscalene block using Exparel placed preoperatively by the anesthesiologist.  Surgeon:   Pascal Lux, MD  Assistant:   Cameron Proud, PA-C; Erin Mecum, PA-S  Findings:   As above. There were extensive grade 3 chondromalacial changes involving the humeral head and grade 2-3 chondromalacial changes involving the glenoid. Extensive synovitis was noted anteriorly, superiorly, and posterosuperiorly. The intra-articular portion of the long head of the biceps tendon was absent, but there was significant thickening/scarring of the rotator interval. The labrum demonstrated moderate fraying anteriorly, superiorly, and posterosuperiorly without frank detachment from the glenoid. The rotator cuff was in satisfactory condition.  Complications:   None  Fluids:   900 cc  Estimated blood loss:   5 cc  Tourniquet time:   None  Drains:   None  Closure:   Staples      Brief clinical note:   The patient is a 53 year old female with a history of gradually worsening left shoulder pain and weakness. The patient's symptoms have progressed despite medications, activity modification, etc. The patient's history and examination are consistent with impingement/tendinopathy with a possible rotator cuff tear. A preoperative MRI scan demonstrated no evidence for rotator cuff tears, but suggested biceps tendinopathy as well as rotator cuff tendinopathy. The patient presents at this time for definitive management of these shoulder symptoms.  Procedure:   The patient underwent placement of an  interscalene block using Exparel by the anesthesiologist in the preoperative holding area before being brought into the operating room and lain in the supine position. The patient then underwent general endotracheal intubation and anesthesia before being repositioned in the beach chair position using the beach chair positioner. The left shoulder and upper extremity were prepped with ChloraPrep solution before being draped sterilely. Preoperative antibiotics were administered. A timeout was performed to confirm the proper surgical site before the expected portal sites and incision site were injected with 0.5% Sensorcaine with epinephrine. A posterior portal was created and the glenohumeral joint thoroughly inspected with the findings as described above. An anterior portal was created using an outside-in technique. The labrum and rotator cuff were further probed, again confirming the above-noted findings. The areas of labral fraying were debrided back to stable margins using the full-radius resector, as were the areas of extensive synovitis. In addition, an abrasion chondroplasty was performed using the full-radius resector to help remove any loose articular cartilage from the glenoid and humeral surfaces. The ArthroCare wand was inserted and used to debride out the rotator interval as well as to free up the subscapularis tendon from adjacent scar tissue to facilitate its mobility. It also was used to obtain hemostasis as well as to "anneal" the labrum superiorly and anteriorly. The instruments were removed from the joint after suctioning the excess fluid.  The camera was repositioned through the posterior portal into the subacromial space. A separate lateral portal was created using an outside-in technique. The 3.5 mm full-radius resector was introduced and used to perform a subtotal bursectomy. The ArthroCare wand was then inserted and used to remove the periosteal tissue off the undersurface of the anterior third  of the acromion as well as to recess the coracoacromial ligament from  its attachment along the anterior and lateral margins of the acromion. There was no significant bony lip extending down from the anterior or lateral aspects of the acromion, so no formal bony debridement was performed. Hemostasis was achieved using the ArthroCare wand before the instruments were removed from the subacromial space after suctioning the excess fluid.  The portal sites were closed using staples. A sterile bulky dressing was applied to the shoulder before the arm was placed into a shoulder sling. The patient was then awakened, extubated, and returned to the recovery room in satisfactory condition after tolerating the procedure well.

## 2019-10-03 NOTE — H&P (Signed)
Paper H&P to be scanned into permanent record. H&P reviewed and patient re-examined. No changes. 

## 2019-10-03 NOTE — Anesthesia Postprocedure Evaluation (Signed)
Anesthesia Post Note  Patient: Nichole Brown  Procedure(s) Performed: SHOULDER ARTHROSCOPY WITH DEBRIDEMENT, DECOMPRESSION (Left Shoulder)  Patient location during evaluation: PACU Anesthesia Type: General Level of consciousness: awake and alert Pain management: pain level controlled Vital Signs Assessment: post-procedure vital signs reviewed and stable Respiratory status: spontaneous breathing, nonlabored ventilation, respiratory function stable and patient connected to nasal cannula oxygen Cardiovascular status: blood pressure returned to baseline and stable Postop Assessment: no apparent nausea or vomiting Anesthetic complications: no     Last Vitals:  Vitals:   10/03/19 1413 10/03/19 1440  BP: 123/75 125/76  Pulse: 71 77  Resp: 16   Temp: (!) 36.3 C   SpO2: 99%     Last Pain:  Vitals:   10/03/19 1413  TempSrc: Temporal  PainSc: 0-No pain                 Precious Haws [place]

## 2019-10-03 NOTE — Progress Notes (Signed)
Patient's HR dropped to 57. Her baseline when she arrived was 91. Dr. Amie Critchley stated that he was okay will that HR. He stated she was probably nervous when she got here this AM. I will continue to monitor her.

## 2019-10-03 NOTE — Anesthesia Procedure Notes (Signed)
Procedure Name: Intubation Date/Time: 10/03/2019 10:35 AM Performed by: Bernardo Heater, CRNA Pre-anesthesia Checklist: Patient identified, Patient being monitored, Timeout performed, Emergency Drugs available and Suction available Patient Re-evaluated:Patient Re-evaluated prior to induction Oxygen Delivery Method: Circle system utilized Preoxygenation: Pre-oxygenation with 100% oxygen Induction Type: IV induction Laryngoscope Size: Mac and 3 Grade View: Grade I Tube type: Oral Tube size: 7.0 mm Number of attempts: 1 Placement Confirmation: ETT inserted through vocal cords under direct vision,  positive ETCO2 and breath sounds checked- equal and bilateral Secured at: 21 cm Tube secured with: Tape Dental Injury: Teeth and Oropharynx as per pre-operative assessment

## 2019-10-03 NOTE — Discharge Instructions (Addendum)
Orthopedic discharge instructions: Keep dressing dry and intact.  May shower after dressing changed on post-op day #4 (Monday).  Cover staples with Band-Aids after drying off. Apply ice frequently to shoulder. Take ibuprofen 600-800 mg TID with meals for 7-10 days, then as necessary. Take oxycodone as prescribed when needed.  May supplement with ES Tylenol if necessary. Keep shoulder sling on at all times until nerve block wears off, then may remove it and begin to use arm as normally as possible. Begin physical therapy as scheduled next week. Follow-up in 10-14 days or as scheduled.   AMBULATORY SURGERY  DISCHARGE INSTRUCTIONS   1) The drugs that you were given will stay in your system until tomorrow so for the next 24 hours you should not:  A) Drive an automobile B) Make any legal decisions C) Drink any alcoholic beverage   2) You may resume regular meals tomorrow.  Today it is better to start with liquids and gradually work up to solid foods.  You may eat anything you prefer, but it is better to start with liquids, then soup and crackers, and gradually work up to solid foods.   3) Please notify your doctor immediately if you have any unusual bleeding, trouble breathing, redness and pain at the surgery site, drainage, fever, or pain not relieved by medication.    4) Additional Instructions:        Please contact your physician with any problems or Same Day Surgery at [phone], Monday through Friday 6 am to 4 pm, or [place] at [facility] number at [phone].

## 2019-10-08 DIAGNOSIS — R29898 Other symptoms and signs involving the musculoskeletal system: Secondary | ICD-10-CM | POA: Diagnosis not present

## 2019-10-08 DIAGNOSIS — M25512 Pain in left shoulder: Secondary | ICD-10-CM | POA: Diagnosis not present

## 2019-10-08 DIAGNOSIS — M25612 Stiffness of left shoulder, not elsewhere classified: Secondary | ICD-10-CM | POA: Diagnosis not present

## 2019-10-08 DIAGNOSIS — Z9889 Other specified postprocedural states: Secondary | ICD-10-CM | POA: Diagnosis not present

## 2019-10-14 DIAGNOSIS — Z9889 Other specified postprocedural states: Secondary | ICD-10-CM | POA: Diagnosis not present

## 2019-10-17 DIAGNOSIS — Z9889 Other specified postprocedural states: Secondary | ICD-10-CM | POA: Diagnosis not present

## 2019-10-21 DIAGNOSIS — Z9889 Other specified postprocedural states: Secondary | ICD-10-CM | POA: Diagnosis not present

## 2019-10-23 DIAGNOSIS — Z9889 Other specified postprocedural states: Secondary | ICD-10-CM | POA: Diagnosis not present

## 2019-10-28 DIAGNOSIS — M25512 Pain in left shoulder: Secondary | ICD-10-CM | POA: Diagnosis not present

## 2019-10-28 DIAGNOSIS — Z9889 Other specified postprocedural states: Secondary | ICD-10-CM | POA: Diagnosis not present

## 2019-10-31 ENCOUNTER — Other Ambulatory Visit: Payer: Self-pay

## 2019-10-31 ENCOUNTER — Ambulatory Visit (INDEPENDENT_AMBULATORY_CARE_PROVIDER_SITE_OTHER): Payer: 59 | Admitting: Family Medicine

## 2019-10-31 DIAGNOSIS — Z9889 Other specified postprocedural states: Secondary | ICD-10-CM | POA: Diagnosis not present

## 2019-10-31 DIAGNOSIS — K921 Melena: Secondary | ICD-10-CM | POA: Diagnosis not present

## 2019-10-31 MED ORDER — HYDROCORTISONE (PERIANAL) 2.5 % EX CREA: TOPICAL_CREAM | CUTANEOUS | 0 refills | Status: DC

## 2019-10-31 NOTE — Progress Notes (Signed)
   Subjective:  Audio only  Patient ID: Nichole Brown, female    DOB: 1966-04-18, 53 y.o.   MRN: [id number]  HPI Pt states she had a bowel movement this morning and saw blood on the outside of bowel movement and when she wiped. Pt states the blood is bright red. Pt has had this happened before in October. Pt states she is also experiencing headaches, lower back pain and nausea after bowel movement.   Virtual Visit via Telephone Note  I connected with Nichole Brown on 10/31/19 at 11:00 AM EST by telephone and verified that I am speaking with the correct person using two identifiers.  Location: Patient: home Provider: office   I discussed the limitations, risks, security and privacy concerns of performing an evaluation and management service by telephone and the availability of in person appointments. I also discussed with the patient that there may be a patient responsible charge related to this service. The patient expressed understanding and agreed to proceed.   History of Present Illness:    Observations/Objective:   Assessment and Plan:   Follow Up Instructions:    I discussed the assessment and treatment plan with the patient. The patient was provided an opportunity to ask questions and all were answered. The patient agreed with the plan and demonstrated an understanding of the instructions.   The patient was advised to call back or seek an in-person evaluation if the symptoms worsen or if the condition fails to improve as anticipated.  I provided 18 minutes of non-face-to-face time during this encounter.   Patient just had a colonoscopy 10 months ago.  Has some small polyps next 1 due in several years.  Has had hard stools due to taking pain medication recently.  Notes some bleeding with bowel movements.  He has some discomfort at that time bright red blood.  Stopped fairly quickly.  Vicente Males, LPN     Review of Systems No chest pain no headache     Objective:   Physical Exam  Virtual      Assessment & Plan:  Impression probable rectal fissure with bleeding secondary to constipation  Soften stool with MiraLAX.  Anusol HC 3 times daily.  Symptom care discussed warning signs discussed with negative colonoscopy just 10 months ago no major worries concerning serious pathology

## 2019-11-04 DIAGNOSIS — Z9889 Other specified postprocedural states: Secondary | ICD-10-CM | POA: Diagnosis not present

## 2019-11-06 DIAGNOSIS — H5213 Myopia, bilateral: Secondary | ICD-10-CM | POA: Diagnosis not present

## 2019-11-11 DIAGNOSIS — Z9889 Other specified postprocedural states: Secondary | ICD-10-CM | POA: Diagnosis not present

## 2019-11-12 DIAGNOSIS — Z760 Encounter for issue of repeat prescription: Secondary | ICD-10-CM | POA: Diagnosis not present

## 2019-11-13 DIAGNOSIS — Z9889 Other specified postprocedural states: Secondary | ICD-10-CM | POA: Diagnosis not present

## 2019-11-21 DIAGNOSIS — Z9889 Other specified postprocedural states: Secondary | ICD-10-CM | POA: Diagnosis not present

## 2019-11-24 IMAGING — MR MR SHOULDER*L* W/O CM
4 of 5 series · 31 of 40 positions shown · non-contrast
Comparison: Left shoulder x-ray 01/27/2018

CLINICAL DATA: Left shoulder pain, limited range of motion. History
of prior left shoulder surgery 2493 with subacromial decompression
as well as debridement of the rotator cuff and labrum.

EXAM:
MRI OF THE LEFT SHOULDER WITHOUT CONTRAST
TECHNIQUE: Multiplanar, multisequence MR imaging of the shoulder was performed.
No intravenous contrast was administered.

[Series 5: PD fat-sat · axial · left · 4.0mm · 0.55mm/px · z∈[-33,+97]mm · 8 of 28 slices shown (1 of 2)]
[im 1/28]
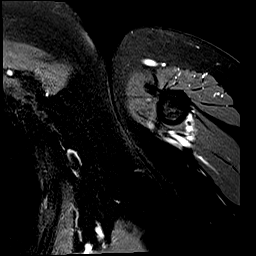
[im 4/28]
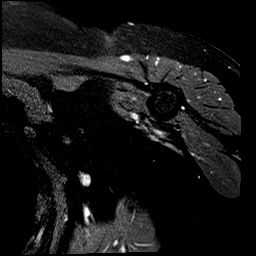
[im 10/28]
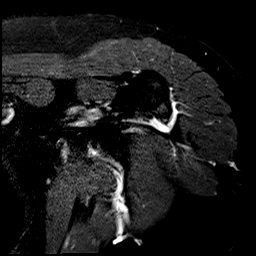
[im 13/28]
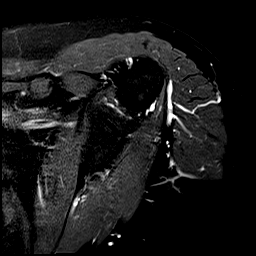
[im 16/28]
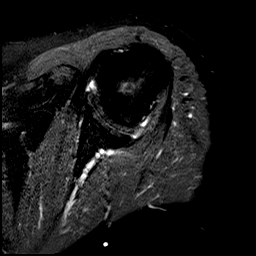
[im 19/28]
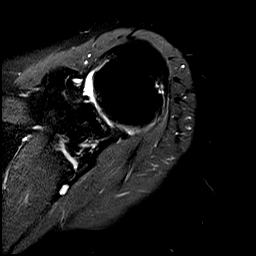
[im 25/28]
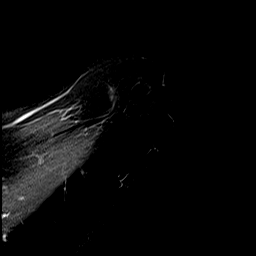
[im 28/28]
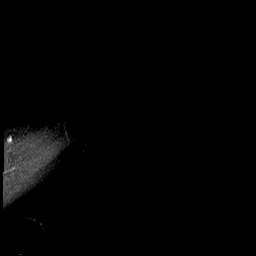

[Series 6: PD fat-sat · oblique · left · 4.0mm · 0.44mm/px · 8 of 26 slices shown (2 of 2)]
[im 1/26]
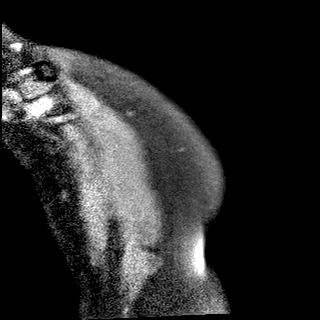
[im 4/26]
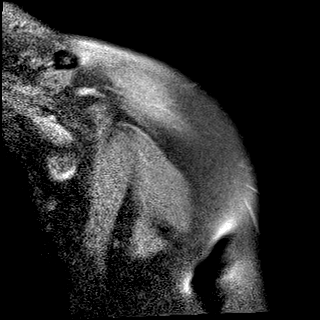
[im 8/26]
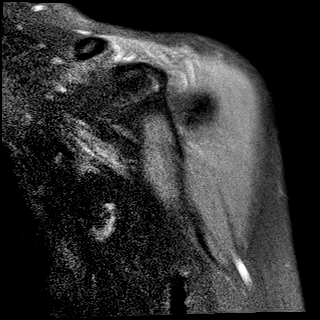
[im 11/26]
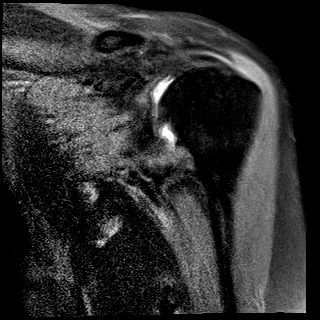
[im 15/26]
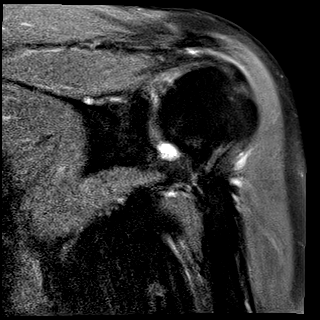
[im 18/26]
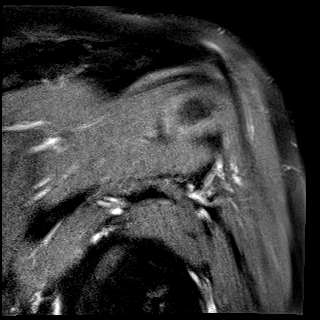
[im 22/26]
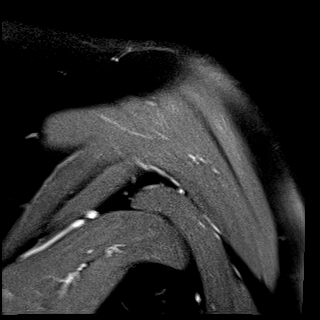
[im 26/26]
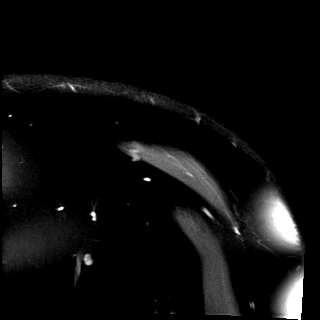

[Series 7: T2 fat-sat · oblique · left · 4.0mm · 0.44mm/px · 8 of 26 slices shown (1 of 2)]
[im 1/26]
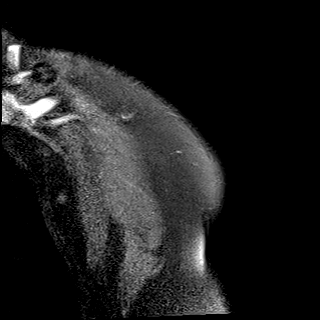
[im 4/26]
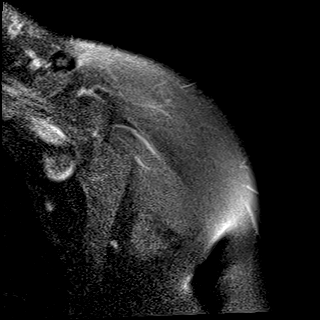
[im 8/26]
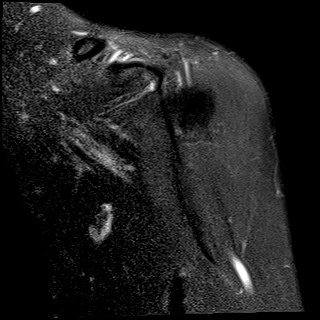
[im 11/26]
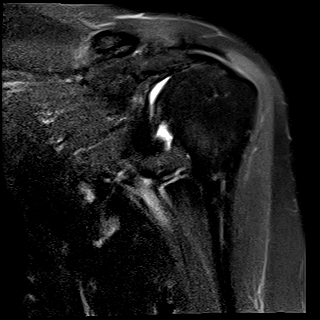
[im 15/26]
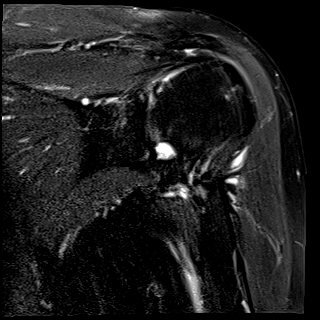
[im 18/26]
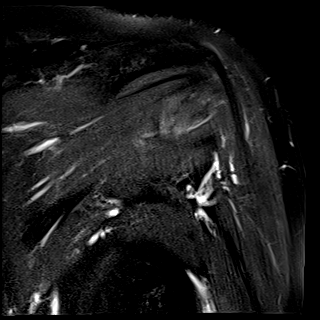
[im 22/26]
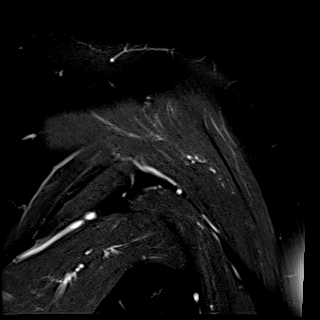
[im 26/26]
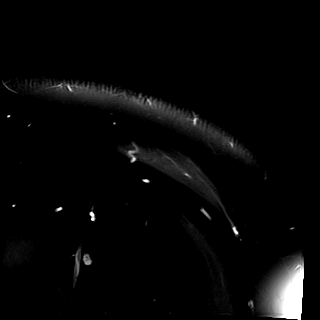

[Series 8: T2 fat-sat · oblique · left · 4.0mm · 0.23mm/px · 7 of 22 slices shown (2 of 2)]
[im 1/22]
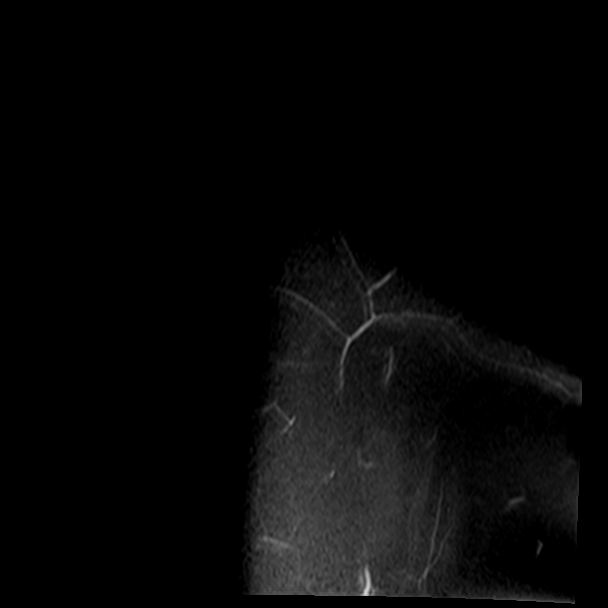
[im 4/22]
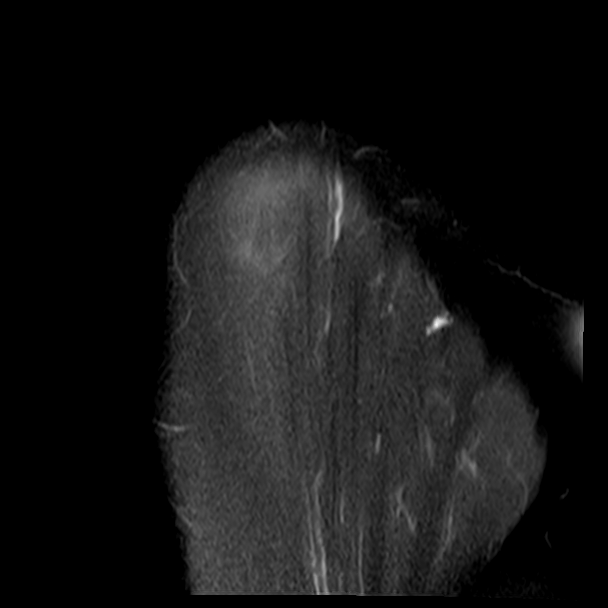
[im 8/22]
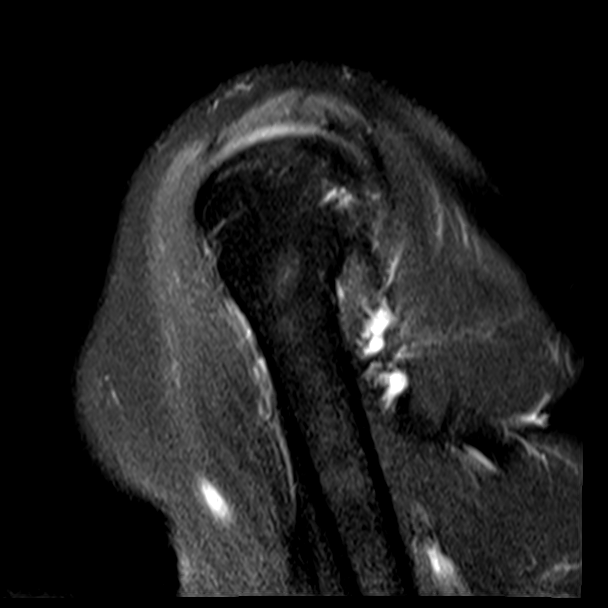
[im 11/22]
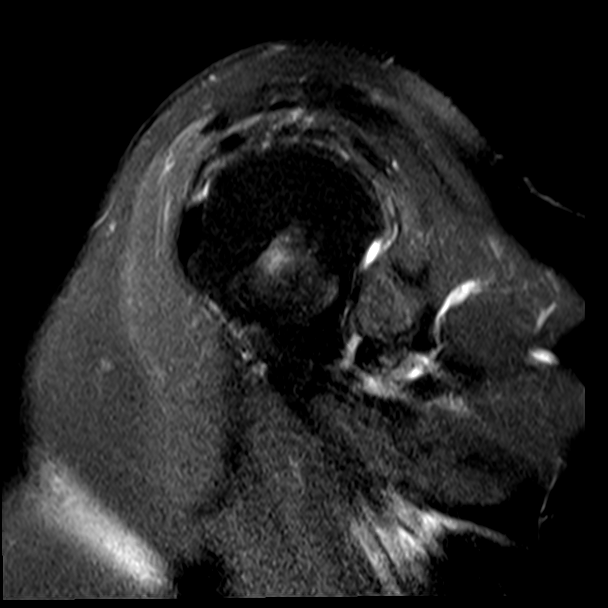
[im 15/22]
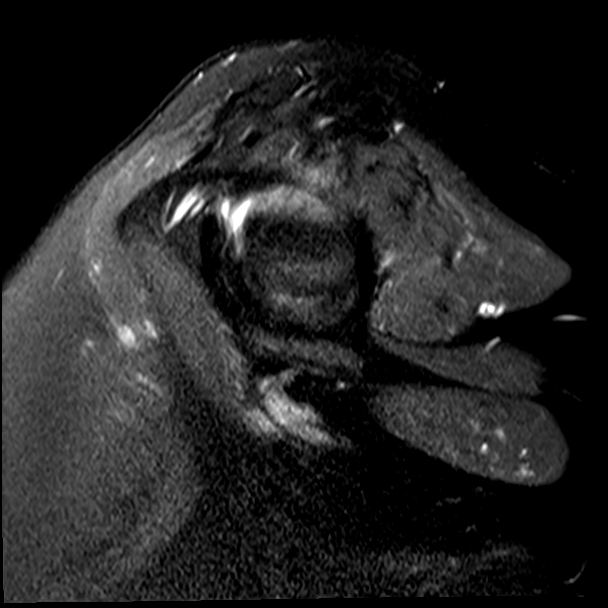
[im 18/22]
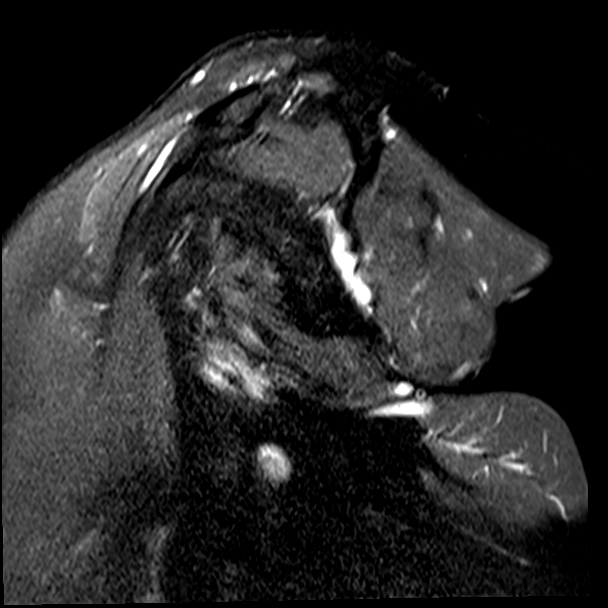
[im 22/22]
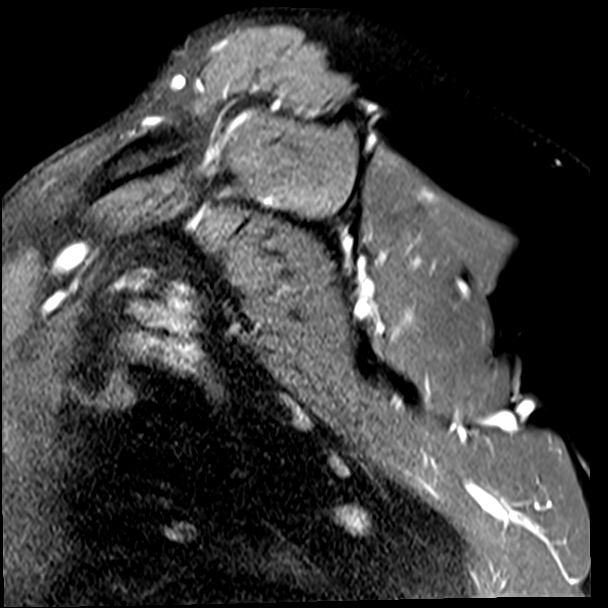

[31 of 40 positions shown; findings below may reference images not displayed]

FINDINGS: Technical note: Motion degraded exam.

Rotator cuff: Supraspinatus and infraspinatus tendons appear
slightly attenuated, however intact without discrete tear. Intact
subscapularis. Teres minor intact.

Muscles: Mild fatty infiltration near the myotendinous junction of
the supraspinatus muscle. Muscles otherwise normal in bulk and
signal intensity.

Biceps long head: Intra-articular portion is not well evaluated
secondary to motion artifact. Extra-articular portion appears well
positioned within the bicipital groove with normal signal intensity.

Acromioclavicular Joint: Prior subacromial decompression. AC joint
otherwise unremarkable. Trace subacromial-subdeltoid bursal edema.

Glenohumeral Joint: No joint effusion. Small amount of debris is
noted within the axillary pouch. Diffuse chondral thinning and
surface irregularity with scattered subchondral reactive marrow
changes of the humeral head and glenoid suggesting overlying
full-thickness chondral defects. Motion artifact precludes detailed
evaluation.

Labrum: Evaluation limited. Prior posterosuperior labral
debridement.

Bones: Humeral head osteophytosis. No marrow abnormality, fracture
or dislocation.

Other: None.
IMPRESSION: 1. Moderate glenohumeral osteoarthritis with diffuse cartilage
irregularities. Debris within the axillary pouch may reflect
synovitis or small loose bodies.
2. Attenuated appearance of the supraspinatus and infraspinatus
tendons, some of which may be attributed to postsurgical changes. No
rotator cuff tear identified.
3. Trace subacromial-subdeltoid bursal edema without fluid
collection.

## 2019-11-28 DIAGNOSIS — Z9889 Other specified postprocedural states: Secondary | ICD-10-CM | POA: Diagnosis not present

## 2019-12-02 DIAGNOSIS — M25512 Pain in left shoulder: Secondary | ICD-10-CM | POA: Diagnosis not present

## 2019-12-02 DIAGNOSIS — Z9889 Other specified postprocedural states: Secondary | ICD-10-CM | POA: Diagnosis not present

## 2019-12-10 ENCOUNTER — Encounter: Payer: Self-pay | Admitting: Family Medicine

## 2019-12-10 ENCOUNTER — Telehealth: Payer: Self-pay | Admitting: Family Medicine

## 2019-12-10 DIAGNOSIS — F419 Anxiety disorder, unspecified: Secondary | ICD-10-CM

## 2019-12-10 NOTE — Telephone Encounter (Signed)
Patient is requesting a referral for anxiety to [facility] psychiatry.Please Advise

## 2019-12-10 NOTE — Telephone Encounter (Signed)
Referral ordered in Epic. Patient notified. 

## 2019-12-10 NOTE — Telephone Encounter (Signed)
Let's do 

## 2019-12-12 DIAGNOSIS — Z9889 Other specified postprocedural states: Secondary | ICD-10-CM | POA: Diagnosis not present

## 2019-12-16 DIAGNOSIS — M19012 Primary osteoarthritis, left shoulder: Secondary | ICD-10-CM | POA: Diagnosis not present

## 2019-12-16 DIAGNOSIS — G8929 Other chronic pain: Secondary | ICD-10-CM | POA: Diagnosis not present

## 2019-12-16 DIAGNOSIS — M25512 Pain in left shoulder: Secondary | ICD-10-CM | POA: Diagnosis not present

## 2019-12-18 DIAGNOSIS — Z9889 Other specified postprocedural states: Secondary | ICD-10-CM | POA: Diagnosis not present

## 2019-12-23 ENCOUNTER — Encounter: Payer: Self-pay | Admitting: Family Medicine

## 2019-12-26 ENCOUNTER — Ambulatory Visit (INDEPENDENT_AMBULATORY_CARE_PROVIDER_SITE_OTHER): Payer: 59 | Admitting: Family Medicine

## 2019-12-26 DIAGNOSIS — F411 Generalized anxiety disorder: Secondary | ICD-10-CM | POA: Diagnosis not present

## 2019-12-26 DIAGNOSIS — F419 Anxiety disorder, unspecified: Secondary | ICD-10-CM | POA: Diagnosis not present

## 2019-12-26 DIAGNOSIS — G8929 Other chronic pain: Secondary | ICD-10-CM

## 2019-12-26 DIAGNOSIS — M546 Pain in thoracic spine: Secondary | ICD-10-CM

## 2019-12-26 MED ORDER — TIZANIDINE HCL 4 MG PO TABS: ORAL_TABLET | ORAL | 2 refills | Status: DC

## 2019-12-26 MED ORDER — ALPRAZOLAM 0.5 MG PO TABS: ORAL_TABLET | ORAL | 2 refills | Status: DC

## 2019-12-26 NOTE — Progress Notes (Signed)
   Subjective:  Audio only  Patient ID: Nichole Brown, female    DOB: 29-Dec-1965, 54 y.o.   MRN: [id number]  HPI Pt is having muscle spasms in shoulders that go down into back. Pt had shoulder surgery back in October. The pain is radiating to lower back.  Pt has tried some anti inflammatories and that helps a little.  Virtual Visit via Telephone Note  I connected with Nichole Brown on 12/26/19 at  1:10 PM EST by telephone and verified that I am speaking with the correct person using two identifiers.  Location: Patient: home Provider: office   I discussed the limitations, risks, security and privacy concerns of performing an evaluation and management service by telephone and the availability of in person appointments. I also discussed with the patient that there may be a patient responsible charge related to this service. The patient expressed understanding and agreed to proceed.   History of Present Illness:    Observations/Objective:   Assessment and Plan:   Follow Up Instructions:    I discussed the assessment and treatment plan with the patient. The patient was provided an opportunity to ask questions and all were answered. The patient agreed with the plan and demonstrated an understanding of the instructions.   The patient was advised to call back or seek an in-person evaluation if the symptoms worsen or if the condition fails to improve as anticipated.  I provided 30 minutes of non-face-to-face time during this encounter.    Moe anxiety at night along with insomnia   Lots of upper back spasms  Has an appt with counselor, thru the [place] [facility]   Working on pre admit lab at [employer], overall less stressful     Review of Systems No headache no chest pain no shortness of breath no abdominal pain    Objective:   Physical Exam  Virtual      Assessment & Plan:  Impression 1 substantial anxiety.  With substantial insomnia.  Patient not really  inclined to take any chronic medications in this regard except for something to help her sleep at night.  Pursuing counseling at this time.  2.  Substantial upper back aching and pain and spasms.  Discussed.  Status post shoulder surgery.  Requests muscle spasm agent.  We will press on and prescribe 1 local measures discussed exercise discussed also   Greater than 50% of this 30 minute face to face visit was spent in counseling and discussion and coordination of care regarding the above diagnosis/diagnosies

## 2020-01-09 ENCOUNTER — Other Ambulatory Visit: Payer: Self-pay | Admitting: *Deleted

## 2020-01-09 DIAGNOSIS — E279 Disorder of adrenal gland, unspecified: Secondary | ICD-10-CM

## 2020-01-15 ENCOUNTER — Ambulatory Visit: Payer: 59 | Admitting: Skilled Nursing Facility1

## 2020-01-15 ENCOUNTER — Encounter: Payer: Self-pay | Admitting: Family Medicine

## 2020-01-20 ENCOUNTER — Encounter: Payer: Self-pay | Admitting: Family Medicine

## 2020-01-20 MED ORDER — BENZONATATE 100 MG PO CAPS: 100.0000 mg | ORAL_CAPSULE | Freq: Three times a day (TID) | ORAL | 0 refills | Status: DC | PRN

## 2020-01-22 ENCOUNTER — Ambulatory Visit (INDEPENDENT_AMBULATORY_CARE_PROVIDER_SITE_OTHER): Payer: 59 | Admitting: Family Medicine

## 2020-01-22 ENCOUNTER — Other Ambulatory Visit: Payer: Self-pay

## 2020-01-22 ENCOUNTER — Encounter: Payer: Self-pay | Admitting: Family Medicine

## 2020-01-22 DIAGNOSIS — J329 Chronic sinusitis, unspecified: Secondary | ICD-10-CM | POA: Diagnosis not present

## 2020-01-22 DIAGNOSIS — J310 Chronic rhinitis: Secondary | ICD-10-CM

## 2020-01-22 MED ORDER — ALBUTEROL SULFATE HFA 108 (90 BASE) MCG/ACT IN AERS: 2.0000 | INHALATION_SPRAY | Freq: Four times a day (QID) | RESPIRATORY_TRACT | 0 refills | Status: DC | PRN

## 2020-01-22 MED ORDER — CLARITHROMYCIN 500 MG PO TABS: 500.0000 mg | ORAL_TABLET | Freq: Two times a day (BID) | ORAL | 0 refills | Status: DC

## 2020-01-22 NOTE — Progress Notes (Signed)
   Subjective:  Audio  Patient ID: Nichole Brown, female    DOB: May 24, 1966, 54 y.o.   MRN: [id number]  Cough This is a new problem. The current episode started in the past 7 days. Associated symptoms include chills, headaches, myalgias, nasal congestion and wheezing. Treatments tried: tessalon perles.   covid test negative   Tight and wheezy cough   Very achey   Low gr 99 temp  Works at [employer]   Some produd    feeels a little  Review of Systems  Constitutional: Positive for chills.  Respiratory: Positive for cough and wheezing.   Musculoskeletal: Positive for myalgias.  Neurological: Positive for headaches.   Virtual Visit via Video Note  I connected with Nichole Brown on 01/22/20 at  4:10 PM EST by a video enabled telemedicine application and verified that I am speaking with the correct person using two identifiers.  Location: Patient: home Provider: office   I discussed the limitations of evaluation and management by telemedicine and the availability of in person appointments. The patient expressed understanding and agreed to proceed.  History of Present Illness:    Observations/Objective:   Assessment and Plan:   Follow Up Instructions:    I discussed the assessment and treatment plan with the patient. The patient was provided an opportunity to ask questions and all were answered. The patient agreed with the plan and demonstrated an understanding of the instructions.   The patient was advised to call back or seek an in-person evaluation if the symptoms worsen or if the condition fails to improve as anticipated.  I provided 20 minutes of non-face-to-face time during this encounter.        Objective:   Physical Exam   Virtual     Assessment & Plan:  Impression probable rhinosinusitis.  Though the potential for false negatives with Covid testing discussed antibiotics prescribed.  Symptom care discussed warning signs discussed

## 2020-01-22 NOTE — Telephone Encounter (Signed)
Patient scheduled virtual visit today with Dr Richardson Landry- 01/22/2020

## 2020-01-22 NOTE — Telephone Encounter (Signed)
Patient had virtual visit with Dr Richardson Landry 01/22/2020

## 2020-01-29 ENCOUNTER — Encounter: Payer: Self-pay | Admitting: Family Medicine

## 2020-01-30 ENCOUNTER — Other Ambulatory Visit: Payer: Self-pay

## 2020-01-30 ENCOUNTER — Ambulatory Visit (HOSPITAL_COMMUNITY)
Admission: RE | Admit: 2020-01-30 | Discharge: 2020-01-30 | Disposition: A | Discharge status: Home / Self Care | Payer: 59 | Source: Ambulatory Visit | Attending: Family Medicine | Admitting: Family Medicine

## 2020-01-30 DIAGNOSIS — D3502 Benign neoplasm of left adrenal gland: Secondary | ICD-10-CM | POA: Diagnosis not present

## 2020-01-30 DIAGNOSIS — E279 Disorder of adrenal gland, unspecified: Secondary | ICD-10-CM | POA: Diagnosis not present

## 2020-02-10 DIAGNOSIS — M19012 Primary osteoarthritis, left shoulder: Secondary | ICD-10-CM | POA: Diagnosis not present

## 2020-02-10 DIAGNOSIS — M7582 Other shoulder lesions, left shoulder: Secondary | ICD-10-CM | POA: Diagnosis not present

## 2020-02-25 ENCOUNTER — Ambulatory Visit (INDEPENDENT_AMBULATORY_CARE_PROVIDER_SITE_OTHER): Payer: 59 | Admitting: Family Medicine

## 2020-02-25 ENCOUNTER — Other Ambulatory Visit: Payer: Self-pay

## 2020-02-25 DIAGNOSIS — R05 Cough: Secondary | ICD-10-CM

## 2020-02-25 DIAGNOSIS — J683 Other acute and subacute respiratory conditions due to chemicals, gases, fumes and vapors: Secondary | ICD-10-CM

## 2020-02-25 MED ORDER — FLOVENT HFA 44 MCG/ACT IN AERO: 2.0000 | INHALATION_SPRAY | Freq: Two times a day (BID) | RESPIRATORY_TRACT | 2 refills | Status: DC

## 2020-02-25 MED ORDER — CLARITHROMYCIN 500 MG PO TABS: 500.0000 mg | ORAL_TABLET | Freq: Two times a day (BID) | ORAL | 0 refills | Status: AC

## 2020-02-25 NOTE — Progress Notes (Signed)
   Subjective:  Audio only  Patient ID: Nichole Brown, female    DOB: 1965-12-31, 54 y.o.   MRN: [id number]  Cough This is a new problem. Associated symptoms include nasal congestion and wheezing.    Patient states she has returned to work but still has cough and congestion and wheezing and has finished all her med.  Pt has aggravating nagging cough  Notes a wheezy texture in the chest  Pt also taking claritin  Using the alb inhaler   Review of Systems  Respiratory: Positive for cough and wheezing.    Virtual Visit via Video Note  I connected with Nichole Brown on 02/25/20 at  1:40 PM EDT by a video enabled telemedicine application and verified that I am speaking with the correct person using two identifiers.  Location: Patient: home Provider: office   I discussed the limitations of evaluation and management by telemedicine and the availability of in person appointments. The patient expressed understanding and agreed to proceed.  History of Present Illness:    Observations/Objective:   Assessment and Plan:   Follow Up Instructions:    I discussed the assessment and treatment plan with the patient. The patient was provided an opportunity to ask questions and all were answered. The patient agreed with the plan and demonstrated an understanding of the instructions.   The patient was advised to call back or seek an in-person evaluation if the symptoms worsen or if the condition fails to improve as anticipated.  I provided 22 minutes of non-face-to-face time during this encounter.   No chest pain no headache no fevers     Objective:   Physical Exam  Virtual      Assessment & Plan:  Impression persistent reactive airways.  Requiring 2 albuterol treatments per day.  Still productive cough.  Feels she is some infection on board.  Biaxin refilled.  Flovent 44 mics per puff 2 puffs twice daily at least for a month.  With refills if needed

## 2020-04-09 ENCOUNTER — Encounter: Payer: Self-pay | Admitting: Family Medicine

## 2020-04-09 ENCOUNTER — Ambulatory Visit: Payer: 59 | Admitting: Family Medicine

## 2020-04-09 ENCOUNTER — Other Ambulatory Visit: Payer: Self-pay

## 2020-04-09 VITALS — BP 126/82 | HR 88 | Temp 97.9°F | Ht 62.0 in | Wt 168.2 lb

## 2020-04-09 DIAGNOSIS — L304 Erythema intertrigo: Secondary | ICD-10-CM

## 2020-04-09 DIAGNOSIS — W57XXXA Bitten or stung by nonvenomous insect and other nonvenomous arthropods, initial encounter: Secondary | ICD-10-CM

## 2020-04-09 MED ORDER — CLOTRIMAZOLE-BETAMETHASONE 1-0.05 % EX CREA: 1.0000 "application " | TOPICAL_CREAM | Freq: Two times a day (BID) | CUTANEOUS | 0 refills | Status: DC

## 2020-04-09 NOTE — Patient Instructions (Signed)

## 2020-04-09 NOTE — Progress Notes (Signed)
Patient ID: Nichole Brown, female    DOB: 05-13-1966, 54 y.o.   MRN: [id number]   Chief Complaint  Patient presents with  . Rash    tick removed from neck Saturday- rash on neck   Subjective:   HPI  HPI Pt with tick on the rt upper chest/neck area, 4 days ago. Prior to that noticed a rash in between the breast, and small bump between the breast that is very itchy. Sweaty between breast and itchy at times. Just small bump where tick bite on neck, but feeling like her whole neck is itchy.  No rash on the neck. No fever, body aches.   Medical History Nichole Brown has a past medical history of Anxiety, Bronchitis, Complication of anesthesia, Depression, Fibromyalgia (2005), GERD (gastroesophageal reflux disease), Insomnia, Migraine headache, and Pneumonia.   Outpatient Encounter Medications as of 04/09/2020  Medication Sig  . albuterol (VENTOLIN HFA) 108 (90 Base) MCG/ACT inhaler Inhale 2 puffs into the lungs 4 (four) times daily as needed for wheezing or shortness of breath.  . ALPRAZolam (XANAX) 0.5 MG tablet Take one tablet po qhs prn anxiety  . clotrimazole-betamethasone (LOTRISONE) cream Apply 1 application topically 2 (two) times daily. To chest rash for 2 wks.  . fluticasone (FLOVENT HFA) 44 MCG/ACT inhaler Inhale 2 puffs into the lungs in the morning and at bedtime.  . hydrocortisone (ANUSOL-HC) 2.5 % rectal cream Apply TID to affected area  . ondansetron (ZOFRAN-ODT) 4 MG disintegrating tablet Take 1 tablet (4 mg total) by mouth every 8 (eight) hours as needed for nausea or vomiting.  . pantoprazole (PROTONIX) 40 MG tablet Take 1 tablet (40 mg total) by mouth daily. For acid reflux (Patient taking differently: Take 40 mg by mouth daily as needed. For acid reflux)  . Propylene Glycol (SYSTANE BALANCE) 0.6 % SOLN Place 1 drop into both eyes daily as needed (dry eyes).  . rizatriptan (MAXALT-MLT) 10 MG disintegrating tablet Take 10 mg by mouth as needed for migraine. May repeat in 2  hours if needed  . tiZANidine (ZANAFLEX) 4 MG tablet Take one tablet po BID prn spasms  . Vitamin D, Ergocalciferol, (DRISDOL) 1.25 MG (50000 UT) CAPS capsule Take 50,000 Units by mouth every 7 (seven) days. Wednesdays   No facility-administered encounter medications on file as of 04/09/2020.     Review of Systems  Constitutional: Negative for chills and fever.  HENT: Negative for congestion, rhinorrhea and sore throat.   Respiratory: Negative for cough, shortness of breath and wheezing.   Cardiovascular: Negative for chest pain and leg swelling.  Gastrointestinal: Negative for abdominal pain, diarrhea, nausea and vomiting.  Genitourinary: Negative for dysuria and frequency.  Musculoskeletal: Negative for arthralgias and back pain.  Skin: Positive for rash.       Insect bite to rt upper chest  Neurological: Negative for dizziness, weakness and headaches.     Vitals BP 126/82   Pulse 88   Temp 97.9 F (36.6 C) (Oral)   Ht 5\' 2"  (1.575 m)   Wt 168 lb 3.2 oz (76.3 kg)   SpO2 100%   BMI 30.76 kg/m   Objective:   Physical Exam Constitutional:      General: She is not in acute distress.    Appearance: Normal appearance.  Musculoskeletal:        General: No swelling or tenderness. Normal range of motion.  Skin:    Findings: Lesion (tick bite, rt upper chest, base of rt neck.) and rash (mild erythema in  btween breasts, small 0.5cm bump on center of chest to left breast.) present.  Neurological:     General: No focal deficit present.     Mental Status: She is alert and oriented to person, place, and time.      Assessment and Plan   1. Tick bite, initial encounter  2. Intertrigo - clotrimazole-betamethasone (LOTRISONE) cream; Apply 1 application topically 2 (two) times daily. To chest rash for 2 wks.  Dispense: 60 g; Refill: 0    Apply lotrisone for 2 wks to chest rash, likely fungal rash.  For tick bite use HC cream and benadryl prn. call or rto if worsening. Gave  info on tick bite and bull's eye rash.  Due in 9/21 for wellness exam. F/u prn.  Pt in agreement with plan.

## 2020-04-27 ENCOUNTER — Other Ambulatory Visit (HOSPITAL_COMMUNITY): Payer: Self-pay | Admitting: Family Medicine

## 2020-04-27 DIAGNOSIS — Z1231 Encounter for screening mammogram for malignant neoplasm of breast: Secondary | ICD-10-CM

## 2020-05-05 ENCOUNTER — Other Ambulatory Visit: Payer: Self-pay

## 2020-05-05 ENCOUNTER — Telehealth: Payer: Self-pay | Admitting: *Deleted

## 2020-05-05 ENCOUNTER — Telehealth (INDEPENDENT_AMBULATORY_CARE_PROVIDER_SITE_OTHER): Payer: 59 | Admitting: Family Medicine

## 2020-05-05 ENCOUNTER — Encounter: Payer: Self-pay | Admitting: Family Medicine

## 2020-05-05 DIAGNOSIS — J0100 Acute maxillary sinusitis, unspecified: Secondary | ICD-10-CM | POA: Diagnosis not present

## 2020-05-05 DIAGNOSIS — H1033 Unspecified acute conjunctivitis, bilateral: Secondary | ICD-10-CM

## 2020-05-05 MED ORDER — POLYMYXIN B-TRIMETHOPRIM 10000-0.1 UNIT/ML-% OP SOLN: 1.0000 [drp] | OPHTHALMIC | 0 refills | Status: DC

## 2020-05-05 MED ORDER — AMOXICILLIN 500 MG PO CAPS: 500.0000 mg | ORAL_CAPSULE | Freq: Three times a day (TID) | ORAL | 0 refills | Status: DC

## 2020-05-05 NOTE — Telephone Encounter (Signed)
Ms. Nichole Brown, Nichole Brown are scheduled for a virtual visit with your provider today.    Just as we do with appointments in the office, we must obtain your consent to participate.  Your consent will be active for this visit and any virtual visit you may have with one of our providers in the next 365 days.    If you have a MyChart account, I can also send a copy of this consent to you electronically.  All virtual visits are billed to your insurance company just like a traditional visit in the office.  As this is a virtual visit, video technology does not allow for your provider to perform a traditional examination.  This may limit your provider's ability to fully assess your condition.  If your provider identifies any concerns that need to be evaluated in person or the need to arrange testing such as labs, EKG, etc, we will make arrangements to do so.    Although advances in technology are sophisticated, we cannot ensure that it will always work on either your end or our end.  If the connection with a video visit is poor, we may have to switch to a telephone visit.  With either a video or telephone visit, we are not always able to ensure that we have a secure connection.   I need to obtain your verbal consent now.   Are you willing to proceed with your visit today?   Nichole Brown has provided verbal consent on 05/05/2020 for a virtual visit (video or telephone).   Mitzie Na, RN 05/05/2020  11:14 AM

## 2020-05-05 NOTE — Progress Notes (Signed)
Patient ID: Nichole Brown, female    DOB: 11-08-1966, 54 y.o.   MRN: [id number]   Virtual Visit via phone Note  I connected with Nichole Brown on 05/05/20 at  1:40 PM EDT by a phone enabled telemedicine application and verified that I am speaking with the correct person using two identifiers.  Location: Patient: home Provider: office   I discussed the limitations of evaluation and management by telemedicine and the availability of in person appointments. The patient expressed understanding and agreed to proceed.  Chief Complaint  Patient presents with  . Cough    sore throat and H/A for a week   Subjective:    HPI Pt had a phone visit for concern of coughing, nasal congestion and headache.  Has been going on for 1 week. Has nasal pain on left side, and not sure if has a "sore" in there, but is very tender.  Also having some eye crusting and drainage in morning.  Using propylene glycol for eye drops for dry eyes. Using xyzal for allergies daily. Cough is dry but at times having a "bad taste" in her mouth. Feeling pressure and pain under eyes. Having a headache. Has h/o migraines and had one yesterday and needed to take her maxalt.   Pt having discharge and matting of eyelashes in morning. No redness to sclera of eyes.  Medical History Nichole Brown has a past medical history of Anxiety, Bronchitis, Complication of anesthesia, Depression, Fibromyalgia (2005), GERD (gastroesophageal reflux disease), Insomnia, Migraine headache, and Pneumonia.   Outpatient Encounter Medications as of 05/05/2020  Medication Sig  . albuterol (VENTOLIN HFA) 108 (90 Base) MCG/ACT inhaler Inhale 2 puffs into the lungs 4 (four) times daily as needed for wheezing or shortness of breath.  . ALPRAZolam (XANAX) 0.5 MG tablet Take one tablet po qhs prn anxiety  . amoxicillin (AMOXIL) 500 MG capsule Take 1 capsule (500 mg total) by mouth 3 (three) times daily.  . clotrimazole-betamethasone (LOTRISONE) cream  Apply 1 application topically 2 (two) times daily. To chest rash for 2 wks.  . fluticasone (FLOVENT HFA) 44 MCG/ACT inhaler Inhale 2 puffs into the lungs in the morning and at bedtime.  . hydrocortisone (ANUSOL-HC) 2.5 % rectal cream Apply TID to affected area  . ondansetron (ZOFRAN-ODT) 4 MG disintegrating tablet Take 1 tablet (4 mg total) by mouth every 8 (eight) hours as needed for nausea or vomiting.  . pantoprazole (PROTONIX) 40 MG tablet Take 1 tablet (40 mg total) by mouth daily. For acid reflux (Patient taking differently: Take 40 mg by mouth daily as needed. For acid reflux)  . Propylene Glycol (SYSTANE BALANCE) 0.6 % SOLN Place 1 drop into both eyes daily as needed (dry eyes).  . rizatriptan (MAXALT-MLT) 10 MG disintegrating tablet Take 10 mg by mouth as needed for migraine. May repeat in 2 hours if needed  . tiZANidine (ZANAFLEX) 4 MG tablet Take one tablet po BID prn spasms  . trimethoprim-polymyxin b (POLYTRIM) ophthalmic solution Place 1 drop into both eyes every 4 (four) hours.  . Vitamin D, Ergocalciferol, (DRISDOL) 1.25 MG (50000 UT) CAPS capsule Take 50,000 Units by mouth every 7 (seven) days. Wednesdays   No facility-administered encounter medications on file as of 05/05/2020.     Review of Systems  Constitutional: Negative for chills and fever.  HENT: Positive for congestion, postnasal drip, sinus pressure, sinus pain and sore throat. Negative for ear pain and rhinorrhea.   Eyes: Positive for discharge and itching. Negative for pain and  redness.  Respiratory: Positive for cough. Negative for shortness of breath and wheezing.   Gastrointestinal: Negative for constipation, diarrhea, nausea and vomiting.  Neurological: Positive for headaches.     Vitals There were no vitals taken for this visit.  Objective:   Physical Exam  No PE due to phone visit.  Assessment and Plan   1. Acute non-recurrent maxillary sinusitis - amoxicillin (AMOXIL) 500 MG capsule; Take 1  capsule (500 mg total) by mouth 3 (three) times daily.  Dispense: 30 capsule; Refill: 0  2. Acute conjunctivitis of both eyes, unspecified acute conjunctivitis type - trimethoprim-polymyxin b (POLYTRIM) ophthalmic solution; Place 1 drop into both eyes every 4 (four) hours.  Dispense: 10 mL; Refill: 0   Pt given amoxicillin for sinusitis.  Also given Polytrim eye drops for possible conjunctivitis.   Call or rto if worsening in next 2-3 days.  Pt in agreement with plan.  F/u prn.  Follow Up Instructions:    I discussed the assessment and treatment plan with the patient. The patient was provided an opportunity to ask questions and all were answered. The patient agreed with the plan and demonstrated an understanding of the instructions.   The patient was advised to call back or seek an in-person evaluation if the symptoms worsen or if the condition fails to improve as anticipated.  I provided 15 minutes of non-face-to-face time during this encounter.

## 2020-05-07 ENCOUNTER — Ambulatory Visit: Payer: 59 | Admitting: Family Medicine

## 2020-05-07 ENCOUNTER — Other Ambulatory Visit: Payer: Self-pay

## 2020-05-07 ENCOUNTER — Encounter: Payer: Self-pay | Admitting: Family Medicine

## 2020-05-07 VITALS — BP 118/72 | HR 75 | Temp 97.2°F | Ht 62.0 in | Wt 167.2 lb

## 2020-05-07 DIAGNOSIS — J683 Other acute and subacute respiratory conditions due to chemicals, gases, fumes and vapors: Secondary | ICD-10-CM

## 2020-05-07 DIAGNOSIS — J302 Other seasonal allergic rhinitis: Secondary | ICD-10-CM | POA: Diagnosis not present

## 2020-05-07 DIAGNOSIS — R590 Localized enlarged lymph nodes: Secondary | ICD-10-CM

## 2020-05-07 DIAGNOSIS — W57XXXA Bitten or stung by nonvenomous insect and other nonvenomous arthropods, initial encounter: Secondary | ICD-10-CM

## 2020-05-07 MED ORDER — ALBUTEROL SULFATE HFA 108 (90 BASE) MCG/ACT IN AERS: 2.0000 | INHALATION_SPRAY | Freq: Four times a day (QID) | RESPIRATORY_TRACT | 2 refills | Status: DC | PRN

## 2020-05-07 MED ORDER — PREDNISONE 20 MG PO TABS: 40.0000 mg | ORAL_TABLET | Freq: Every day | ORAL | 0 refills | Status: DC

## 2020-05-07 MED ORDER — FLOVENT HFA 44 MCG/ACT IN AERO: 2.0000 | INHALATION_SPRAY | Freq: Two times a day (BID) | RESPIRATORY_TRACT | 2 refills | Status: DC

## 2020-05-07 NOTE — Progress Notes (Signed)
Patient ID: Nichole Brown, female    DOB: 05/29/1966, 54 y.o.   MRN: [id number]   Chief Complaint  Patient presents with  . Sinus Problem  . knot on neck   Subjective:    HPI Pt seen for follow up on concern of itchy eyes, itchy nose, sneezing, coughing.  Now having rt ear pain and had an insect bite near rt ear.  Feeling an enlarged "knot" on side of neck near rt ear.  No fever.   Was seen for phone visit for allergy symptoms on 05/05/20. Given polytrim eye drops and amoxicillin 500mg  tid for 10 days for concern of possible sinusitis.  She has been taking the medications, had 2 days of meds, but not feeling much better.  Stating her coughing is worsening and using her inhalers as directed.  Coughing worse at night.   She is using albuterol at night.    Medical History Kataryna has a past medical history of Anxiety, Bronchitis, Complication of anesthesia, Depression, Fibromyalgia (2005), GERD (gastroesophageal reflux disease), Insomnia, Migraine headache, and Pneumonia.   Outpatient Encounter Medications as of 05/07/2020  Medication Sig  . albuterol (VENTOLIN HFA) 108 (90 Base) MCG/ACT inhaler Inhale 2 puffs into the lungs 4 (four) times daily as needed for wheezing or shortness of breath.  . ALPRAZolam (XANAX) 0.5 MG tablet Take one tablet po qhs prn anxiety  . amoxicillin (AMOXIL) 500 MG capsule Take 1 capsule (500 mg total) by mouth 3 (three) times daily.  . clotrimazole-betamethasone (LOTRISONE) cream Apply 1 application topically 2 (two) times daily. To chest rash for 2 wks.  . fluticasone (FLOVENT HFA) 44 MCG/ACT inhaler Inhale 2 puffs into the lungs in the morning and at bedtime.  . hydrocortisone (ANUSOL-HC) 2.5 % rectal cream Apply TID to affected area  . ondansetron (ZOFRAN-ODT) 4 MG disintegrating tablet Take 1 tablet (4 mg total) by mouth every 8 (eight) hours as needed for nausea or vomiting.  . pantoprazole (PROTONIX) 40 MG tablet Take 1 tablet (40 mg total) by mouth  daily. For acid reflux (Patient taking differently: Take 40 mg by mouth daily as needed. For acid reflux)  . predniSONE (DELTASONE) 20 MG tablet Take 2 tablets (40 mg total) by mouth daily with breakfast.  . Propylene Glycol (SYSTANE BALANCE) 0.6 % SOLN Place 1 drop into both eyes daily as needed (dry eyes).  . rizatriptan (MAXALT-MLT) 10 MG disintegrating tablet Take 10 mg by mouth as needed for migraine. May repeat in 2 hours if needed  . tiZANidine (ZANAFLEX) 4 MG tablet Take one tablet po BID prn spasms  . trimethoprim-polymyxin b (POLYTRIM) ophthalmic solution Place 1 drop into both eyes every 4 (four) hours.  . Vitamin D, Ergocalciferol, (DRISDOL) 1.25 MG (50000 UT) CAPS capsule Take 50,000 Units by mouth every 7 (seven) days. Wednesdays  . [DISCONTINUED] albuterol (VENTOLIN HFA) 108 (90 Base) MCG/ACT inhaler Inhale 2 puffs into the lungs 4 (four) times daily as needed for wheezing or shortness of breath.  . [DISCONTINUED] fluticasone (FLOVENT HFA) 44 MCG/ACT inhaler Inhale 2 puffs into the lungs in the morning and at bedtime.   No facility-administered encounter medications on file as of 05/07/2020.     Review of Systems  Constitutional: Positive for fatigue. Negative for chills and fever.  HENT: Positive for ear pain. Negative for congestion, postnasal drip, rhinorrhea, sinus pressure, sinus pain, sneezing and sore throat.        +itchy nose  Eyes: Positive for itching. Negative for pain, discharge and  redness.  Respiratory: Positive for cough. Negative for shortness of breath and wheezing.   Gastrointestinal: Negative for constipation, diarrhea, nausea and vomiting.  Skin: Negative for rash.  Neurological: Negative for headaches.     Vitals BP 118/72   Pulse 75   Temp (!) 97.2 F (36.2 C) (Oral)   Ht 5\' 2"  (1.575 m)   Wt 167 lb 3.2 oz (75.8 kg)   SpO2 97%   BMI 30.58 kg/m   Objective:   Physical Exam Vitals and nursing note reviewed.  Constitutional:      General:  She is not in acute distress.    Appearance: Normal appearance. She is not toxic-appearing.  HENT:     Head: Normocephalic and atraumatic.     Right Ear: Tympanic membrane, ear canal and external ear normal.     Left Ear: Tympanic membrane, ear canal and external ear normal.     Nose: Nose normal. No congestion or rhinorrhea.     Mouth/Throat:     Mouth: Mucous membranes are moist.     Pharynx: Oropharynx is clear. No oropharyngeal exudate or posterior oropharyngeal erythema.  Eyes:     Extraocular Movements: Extraocular movements intact.     Conjunctiva/sclera: Conjunctivae normal.     Pupils: Pupils are equal, round, and reactive to light.  Cardiovascular:     Rate and Rhythm: Normal rate and regular rhythm.     Pulses: Normal pulses.     Heart sounds: Normal heart sounds. No murmur.  Pulmonary:     Effort: Pulmonary effort is normal. No respiratory distress.     Breath sounds: Normal breath sounds. No wheezing, rhonchi or rales.  Musculoskeletal:     Cervical back: Normal range of motion and neck supple. No tenderness.  Lymphadenopathy:     Cervical: Cervical adenopathy (rt auricular area, soft mobile about 0.5cm. non tender cervical adneopathy) present.  Skin:    General: Skin is warm and dry.     Findings: No rash.     Comments: +small insect bite near rt pre-auricular area, mild erythema surrounding, no papule, induration, or swelling.  Neurological:     General: No focal deficit present.     Mental Status: She is alert and oriented to person, place, and time.  Psychiatric:        Mood and Affect: Mood normal.        Behavior: Behavior normal.      Assessment and Plan   1. Seasonal allergic rhinitis, unspecified trigger - predniSONE (DELTASONE) 20 MG tablet; Take 2 tablets (40 mg total) by mouth daily with breakfast.  Dispense: 10 tablet; Refill: 0  2. 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  Dispense: 10 tablet; Refill: 0 - albuterol (VENTOLIN HFA) 108 (90 Base) MCG/ACT inhaler; Inhale 2 puffs into the lungs 4 (four) times daily as needed for wheezing or shortness of breath.  Dispense: 18 g; Refill: 2 - fluticasone (FLOVENT HFA) 44 MCG/ACT inhaler; Inhale 2 puffs into the lungs in the morning and at bedtime.  Dispense: 1 Inhaler; Refill: 2  3. Tick bite, initial encounter  4. Cervical lymphadenopathy    Pt to cont to monitor for the worsening rash from the tick bite near rt ear, no bull's eye rash seen. Cont to monitor lymphadenopathy.  If painful, warm, red, or getting a fever to call back. Cont with amoxicillin until finished.  Gave refill on albuterol/flovent, and cont with polytrim eye drops. Take prednisone burst for 5 days.  F/u prn. Pt in agreement.

## 2020-05-20 ENCOUNTER — Other Ambulatory Visit (HOSPITAL_COMMUNITY): Payer: Self-pay | Admitting: Family Medicine

## 2020-05-20 DIAGNOSIS — Z1231 Encounter for screening mammogram for malignant neoplasm of breast: Secondary | ICD-10-CM

## 2020-06-08 DIAGNOSIS — D1801 Hemangioma of skin and subcutaneous tissue: Secondary | ICD-10-CM | POA: Diagnosis not present

## 2020-06-08 DIAGNOSIS — L570 Actinic keratosis: Secondary | ICD-10-CM | POA: Diagnosis not present

## 2020-06-08 DIAGNOSIS — L218 Other seborrheic dermatitis: Secondary | ICD-10-CM | POA: Diagnosis not present

## 2020-06-22 ENCOUNTER — Other Ambulatory Visit: Payer: Self-pay

## 2020-06-22 ENCOUNTER — Ambulatory Visit (HOSPITAL_COMMUNITY)
Admission: RE | Admit: 2020-06-22 | Discharge: 2020-06-22 | Disposition: A | Discharge status: Home / Self Care | Payer: 59 | Source: Ambulatory Visit | Attending: Family Medicine | Admitting: Family Medicine

## 2020-06-22 DIAGNOSIS — Z1231 Encounter for screening mammogram for malignant neoplasm of breast: Secondary | ICD-10-CM | POA: Diagnosis not present

## 2020-07-23 DIAGNOSIS — F458 Other somatoform disorders: Secondary | ICD-10-CM | POA: Diagnosis not present

## 2020-07-23 DIAGNOSIS — M542 Cervicalgia: Secondary | ICD-10-CM | POA: Diagnosis not present

## 2020-07-23 DIAGNOSIS — G43709 Chronic migraine without aura, not intractable, without status migrainosus: Secondary | ICD-10-CM | POA: Diagnosis not present

## 2020-07-24 ENCOUNTER — Ambulatory Visit
Admission: RE | Admit: 2020-07-24 | Discharge: 2020-07-24 | Disposition: A | Discharge status: Home / Self Care | Payer: 59 | Source: Ambulatory Visit | Attending: Family Medicine | Admitting: Family Medicine

## 2020-07-24 ENCOUNTER — Encounter: Payer: Self-pay | Admitting: Family Medicine

## 2020-07-24 ENCOUNTER — Other Ambulatory Visit: Payer: Self-pay

## 2020-07-24 ENCOUNTER — Ambulatory Visit (INDEPENDENT_AMBULATORY_CARE_PROVIDER_SITE_OTHER): Payer: 59 | Admitting: Family Medicine

## 2020-07-24 VITALS — HR 77 | Temp 99.8°F | Resp 16

## 2020-07-24 DIAGNOSIS — R05 Cough: Secondary | ICD-10-CM | POA: Insufficient documentation

## 2020-07-24 DIAGNOSIS — Z8709 Personal history of other diseases of the respiratory system: Secondary | ICD-10-CM | POA: Diagnosis not present

## 2020-07-24 DIAGNOSIS — R053 Chronic cough: Secondary | ICD-10-CM

## 2020-07-24 MED ORDER — BENZONATATE 100 MG PO CAPS: 100.0000 mg | ORAL_CAPSULE | Freq: Two times a day (BID) | ORAL | 0 refills | Status: DC | PRN

## 2020-07-24 MED ORDER — ALBUTEROL SULFATE HFA 108 (90 BASE) MCG/ACT IN AERS: 2.0000 | INHALATION_SPRAY | Freq: Four times a day (QID) | RESPIRATORY_TRACT | 2 refills | Status: DC | PRN

## 2020-07-24 MED ORDER — PANTOPRAZOLE SODIUM 40 MG PO TBEC: 40.0000 mg | DELAYED_RELEASE_TABLET | Freq: Every day | ORAL | 1 refills | Status: DC

## 2020-07-24 NOTE — Progress Notes (Signed)
Patient ID: Nichole Brown, female    DOB: 09-30-1966, 54 y.o.   MRN: [id number]   Chief Complaint  Patient presents with  . Cough   Subjective:    Cough This is a new problem. Episode onset: Seen May 27th for sinus issues and cough, given abx and prednisone. Cough has never cleared up. Feels chest and back tightness. Using inhalers as prescribed. Worse in the AM.  Reports a horrible taste when coughing.  Pertinent negatives include no chest pain, chills, ear pain, fever, headaches, postnasal drip, rash, rhinorrhea, sore throat, shortness of breath or wheezing.  No fever, sore throat or runny nose.  Does have some nasal congestion in AMs. Pt physically feels good, not feeling fatigued or ill, just the cough is lingering.  She has h/o gerd and was not taking the protonix 40mg  anymore.  Does notice increase in indigestion and heart burn recently. Went to pulm in past had pfts in 2019, but doesn't recall the result.  Per the chart the pft- was normal.  She said never had a diagnosis of asthma.  But does get bronchitis a lot and has been given albuterol prn.  No h/o smoking, however in chart, states she was a "former smoker." Not having pain or pressure in sinuses.  Cough is dry, non-productive with bad taste in mouth.    Medical History Nichole Brown has a past medical history of Anxiety, Bronchitis, Complication of anesthesia, Depression, Fibromyalgia (2005), GERD (gastroesophageal reflux disease), Insomnia, Migraine headache, and Pneumonia.   Outpatient Encounter Medications as of 07/24/2020  Medication Sig  . albuterol (VENTOLIN HFA) 108 (90 Base) MCG/ACT inhaler Inhale 2 puffs into the lungs 4 (four) times daily as needed for wheezing or shortness of breath.  . ALPRAZolam (XANAX) 0.5 MG tablet Take one tablet po qhs prn anxiety  . benzonatate (TESSALON) 100 MG capsule Take 1 capsule (100 mg total) by mouth 2 (two) times daily as needed for cough.  . clotrimazole-betamethasone (LOTRISONE)  cream Apply 1 application topically 2 (two) times daily. To chest rash for 2 wks.  . fluticasone (FLOVENT HFA) 44 MCG/ACT inhaler Inhale 2 puffs into the lungs in the morning and at bedtime.  . hydrocortisone (ANUSOL-HC) 2.5 % rectal cream Apply TID to affected area  . ondansetron (ZOFRAN-ODT) 4 MG disintegrating tablet Take 1 tablet (4 mg total) by mouth every 8 (eight) hours as needed for nausea or vomiting.  . pantoprazole (PROTONIX) 40 MG tablet Take 1 tablet (40 mg total) by mouth daily. For acid reflux  . Propylene Glycol (SYSTANE BALANCE) 0.6 % SOLN Place 1 drop into both eyes daily as needed (dry eyes).  . rizatriptan (MAXALT-MLT) 10 MG disintegrating tablet Take 10 mg by mouth as needed for migraine. May repeat in 2 hours if needed  . tiZANidine (ZANAFLEX) 4 MG tablet Take one tablet po BID prn spasms  . trimethoprim-polymyxin b (POLYTRIM) ophthalmic solution Place 1 drop into both eyes every 4 (four) hours.  . Vitamin D, Ergocalciferol, (DRISDOL) 1.25 MG (50000 UT) CAPS capsule Take 50,000 Units by mouth every 7 (seven) days. Wednesdays  . [DISCONTINUED] albuterol (VENTOLIN HFA) 108 (90 Base) MCG/ACT inhaler Inhale 2 puffs into the lungs 4 (four) times daily as needed for wheezing or shortness of breath.  . [DISCONTINUED] amoxicillin (AMOXIL) 500 MG capsule Take 1 capsule (500 mg total) by mouth 3 (three) times daily.  . [DISCONTINUED] pantoprazole (PROTONIX) 40 MG tablet Take 1 tablet (40 mg total) by mouth daily. For acid reflux (Patient  taking differently: Take 40 mg by mouth daily as needed. For acid reflux)  . [DISCONTINUED] predniSONE (DELTASONE) 20 MG tablet Take 2 tablets (40 mg total) by mouth daily with breakfast.   No facility-administered encounter medications on file as of 07/24/2020.     Review of Systems  Constitutional: Negative for chills and fever.  HENT: Positive for congestion. Negative for ear pain, nosebleeds, postnasal drip, rhinorrhea, sinus pressure, sinus pain,  sneezing and sore throat.   Respiratory: Positive for cough. Negative for shortness of breath and wheezing.   Cardiovascular: Negative for chest pain and leg swelling.  Gastrointestinal: Negative for abdominal pain, diarrhea, nausea and vomiting.  Genitourinary: Negative for dysuria and frequency.  Musculoskeletal: Negative for arthralgias and back pain.  Skin: Negative for rash.  Neurological: Negative for dizziness, weakness and headaches.     Vitals Pulse 77   Temp 99.8 F (37.7 C) (Temporal) Comment: outside in tent in the heat  Resp 16   SpO2 97%   Objective:   Physical Exam Vitals and nursing note reviewed.  Constitutional:      General: She is not in acute distress.    Appearance: Normal appearance. She is not ill-appearing or toxic-appearing.  HENT:     Head: Normocephalic and atraumatic.     Right Ear: External ear normal.     Left Ear: External ear normal.     Nose: Nose normal. No congestion or rhinorrhea.     Mouth/Throat:     Mouth: Mucous membranes are moist.     Pharynx: Oropharynx is clear. No oropharyngeal exudate or posterior oropharyngeal erythema.  Eyes:     Extraocular Movements: Extraocular movements intact.     Conjunctiva/sclera: Conjunctivae normal.     Pupils: Pupils are equal, round, and reactive to light.  Cardiovascular:     Rate and Rhythm: Normal rate and regular rhythm.     Pulses: Normal pulses.     Heart sounds: Normal heart sounds. No murmur heard.   Pulmonary:     Effort: Pulmonary effort is normal. No respiratory distress.     Breath sounds: Normal breath sounds. No wheezing, rhonchi or rales.  Musculoskeletal:     Cervical back: Normal range of motion.  Skin:    General: Skin is warm and dry.     Findings: No rash.  Neurological:     Mental Status: She is alert and oriented to person, place, and time.  Psychiatric:        Mood and Affect: Mood normal.        Behavior: Behavior normal.      Assessment and Plan   1.  Persistent cough - DG Chest 2 View - benzonatate (TESSALON) 100 MG capsule; Take 1 capsule (100 mg total) by mouth 2 (two) times daily as needed for cough.  Dispense: 30 capsule; Refill: 0 - pantoprazole (PROTONIX) 40 MG tablet; Take 1 tablet (40 mg total) by mouth daily. For acid reflux  Dispense: 90 tablet; Refill: 1 - albuterol (VENTOLIN HFA) 108 (90 Base) MCG/ACT inhaler; Inhale 2 puffs into the lungs 4 (four) times daily as needed for wheezing or shortness of breath.  Dispense: 18 g; Refill: 2  2. History of asthma   Chronic cough- multifactorial, Likely related to untreated gerd and possibly underlying asthma. Pt advised to restart the protonix, gave some tessalon perles in meantime and cont use the inhaler prn. Call or rto if worsening coughing, fever, or sob. Ordered cxr.  Pt to follow up if not improving  in 2-3 wks.  Pt in agreement.

## 2020-08-18 DIAGNOSIS — J340 Abscess, furuncle and carbuncle of nose: Secondary | ICD-10-CM | POA: Diagnosis not present

## 2020-08-25 ENCOUNTER — Ambulatory Visit: Payer: 59 | Admitting: Family Medicine

## 2020-09-02 DIAGNOSIS — J340 Abscess, furuncle and carbuncle of nose: Secondary | ICD-10-CM | POA: Diagnosis not present

## 2020-10-06 ENCOUNTER — Other Ambulatory Visit: Payer: Self-pay

## 2020-10-06 ENCOUNTER — Other Ambulatory Visit: Payer: Self-pay | Admitting: Family Medicine

## 2020-10-06 ENCOUNTER — Encounter: Payer: Self-pay | Admitting: Family Medicine

## 2020-10-06 ENCOUNTER — Ambulatory Visit (INDEPENDENT_AMBULATORY_CARE_PROVIDER_SITE_OTHER): Payer: 59 | Admitting: Family Medicine

## 2020-10-06 VITALS — BP 130/78 | HR 88 | Temp 96.9°F | Ht 62.0 in | Wt 164.4 lb

## 2020-10-06 DIAGNOSIS — K219 Gastro-esophageal reflux disease without esophagitis: Secondary | ICD-10-CM

## 2020-10-06 MED ORDER — FAMOTIDINE 20 MG PO TABS: 20.0000 mg | ORAL_TABLET | Freq: Two times a day (BID) | ORAL | 1 refills | Status: DC

## 2020-10-06 MED ORDER — SUCRALFATE 1 GM/10ML PO SUSP: 1.0000 g | Freq: Three times a day (TID) | ORAL | 0 refills | Status: DC

## 2020-10-06 NOTE — Progress Notes (Signed)
Pt here to follow up on reflux. Pt states reflux is getting worse. Pt states she is unable to eat anything. Pt was taking Pantoprazole daily but backed off because it was making indigestion worse. Stopped taking it about 3 weeks ago. If indigestion gets to bad, she will take a pill but no relief.     Patient ID: Nichole Brown, female    DOB: 16-Jan-1966, 54 y.o.   MRN: [id number]   Chief Complaint  Patient presents with  . Gastroesophageal Reflux   Subjective:  CC: indigestion worsening  Presents today with a complaint of indigestion that is worsening.  About 1-1/2 years ago, Nichole Brown passed out and was taken to the emergency department.  A series of tests were performed, and the conclusion was that she experienced acid reflux.  She has been on Protonix since that time.  She reports that Protonix is no longer helping, seems to be making her indigestion worse.  She has stopped taking Protonix at this time.  She has tried Pepcid over-the-counter and reports that this seems to be helping more than the Protonix.  She also reports that during that hospital stay, she received Carafate solution and this seemed to work very well for her.  She denies any fever, chest pain, shortness of breath, no abdominal pain, no nausea vomiting or diarrhea.    Medical History Niemah has a past medical history of Anxiety, Bronchitis, Complication of anesthesia, Depression, Fibromyalgia (2005), GERD (gastroesophageal reflux disease), Insomnia, Migraine headache, and Pneumonia.   Outpatient Encounter Medications as of 10/06/2020  Medication Sig  . albuterol (VENTOLIN HFA) 108 (90 Base) MCG/ACT inhaler Inhale 2 puffs into the lungs 4 (four) times daily as needed for wheezing or shortness of breath.  . ALPRAZolam (XANAX) 0.5 MG tablet Take one tablet po qhs prn anxiety  . benzonatate (TESSALON) 100 MG capsule Take 1 capsule (100 mg total) by mouth 2 (two) times daily as needed for cough.  . clotrimazole-betamethasone  (LOTRISONE) cream Apply 1 application topically 2 (two) times daily. To chest rash for 2 wks.  . fluticasone (FLOVENT HFA) 44 MCG/ACT inhaler Inhale 2 puffs into the lungs in the morning and at bedtime.  . hydrocortisone (ANUSOL-HC) 2.5 % rectal cream Apply TID to affected area  . ondansetron (ZOFRAN-ODT) 4 MG disintegrating tablet Take 1 tablet (4 mg total) by mouth every 8 (eight) hours as needed for nausea or vomiting.  . pantoprazole (PROTONIX) 40 MG tablet Take 1 tablet (40 mg total) by mouth daily. For acid reflux  . Propylene Glycol (SYSTANE BALANCE) 0.6 % SOLN Place 1 drop into both eyes daily as needed (dry eyes).  . rizatriptan (MAXALT-MLT) 10 MG disintegrating tablet Take 10 mg by mouth as needed for migraine. May repeat in 2 hours if needed  . tiZANidine (ZANAFLEX) 4 MG tablet Take one tablet po BID prn spasms  . trimethoprim-polymyxin b (POLYTRIM) ophthalmic solution Place 1 drop into both eyes every 4 (four) hours.  . Vitamin D, Ergocalciferol, (DRISDOL) 1.25 MG (50000 UT) CAPS capsule Take 50,000 Units by mouth every 7 (seven) days. Wednesdays  . famotidine (PEPCID) 20 MG tablet Take 1 tablet (20 mg total) by mouth 2 (two) times daily.  . sucralfate (CARAFATE) 1 GM/10ML suspension Take 10 mLs (1 g total) by mouth 4 (four) times daily -  with meals and at bedtime.   No facility-administered encounter medications on file as of 10/06/2020.     Review of Systems  Constitutional: Negative for chills and fever.  HENT: Negative.   Respiratory: Negative for shortness of breath.   Cardiovascular: Negative for chest pain.  Gastrointestinal: Negative for abdominal pain, blood in stool, constipation, diarrhea, nausea and vomiting.     Vitals BP 130/78   Pulse 88   Temp (!) 96.9 F (36.1 C)   Ht 5\' 2"  (1.575 m)   Wt 164 lb 6.4 oz (74.6 kg)   SpO2 96%   BMI 30.07 kg/m   Objective:   Physical Exam Vitals and nursing note reviewed.  Constitutional:      Appearance: Normal  appearance.  Cardiovascular:     Rate and Rhythm: Normal rate and regular rhythm.     Heart sounds: Normal heart sounds.  Pulmonary:     Effort: Pulmonary effort is normal.     Breath sounds: Normal breath sounds.  Abdominal:     Palpations: Abdomen is soft.  Skin:    General: Skin is warm and dry.  Neurological:     Mental Status: She is alert and oriented to person, place, and time.  Psychiatric:        Mood and Affect: Mood normal.        Behavior: Behavior normal.        Thought Content: Thought content normal.        Judgment: Judgment normal.      Assessment and Plan   1. Gastroesophageal reflux disease, unspecified whether esophagitis present - sucralfate (CARAFATE) 1 GM/10ML suspension; Take 10 mLs (1 g total) by mouth 4 (four) times daily -  with meals and at bedtime.  Dispense: 420 mL; Refill: 0 - famotidine (PEPCID) 20 MG tablet; Take 1 tablet (20 mg total) by mouth 2 (two) times daily.  Dispense: 60 tablet; Refill: 1   Will change therapy from Protonix to famotidine twice per day.  Since she had such good results with Carafate in the past, will also order this for her to see if it continues to give a good result.  She understands that the new medications may not be effective, will consider gastrointestinal specialist if her symptoms do not resolve.  Information given on food choices for GERD.  Blood pressure rechecked: 110/70.  Agrees with plan of care discussed today. Understands warning signs to seek further care: Chest pain, shortness of breath, any significant changes in health status. Understands to follow-up in 2 months, sooner if needed.    Chalmers Guest, NP 10/06/2020

## 2020-10-06 NOTE — Patient Instructions (Signed)
Try new medication and let us know if it does not work.      Food Choices for Gastroesophageal Reflux Disease, Adult When you have gastroesophageal reflux disease (GERD), the foods you eat and your eating habits are very important. Choosing the right foods can help ease your discomfort. Think about working with a nutrition specialist (dietitian) to help you make good choices. What are tips for following this plan?  Meals  Choose healthy foods that are low in fat, such as fruits, vegetables, whole grains, low-fat dairy products, and lean meat, fish, and poultry.  Eat small meals often instead of 3 large meals a day. Eat your meals slowly, and in a place where you are relaxed. Avoid bending over or lying down until 2-3 hours after eating.  Avoid eating meals 2-3 hours before bed.  Avoid drinking a lot of liquid with meals.  Cook foods using methods other than frying. Bake, grill, or broil food instead.  Avoid or limit: ? Chocolate. ? Peppermint or spearmint. ? Alcohol. ? Pepper. ? Black and decaffeinated coffee. ? Black and decaffeinated tea. ? Bubbly (carbonated) soft drinks. ? Caffeinated energy drinks and soft drinks.  Limit high-fat foods such as: ? Fatty meat or fried foods. ? Whole milk, cream, butter, or ice cream. ? Nuts and nut butters. ? Pastries, donuts, and sweets made with butter or shortening.  Avoid foods that cause symptoms. These foods may be different for everyone. Common foods that cause symptoms include: ? Tomatoes. ? Oranges, lemons, and limes. ? Peppers. ? Spicy food. ? Onions and garlic. ? Vinegar. Lifestyle  Maintain a healthy weight. Ask your doctor what weight is healthy for you. If you need to lose weight, work with your doctor to do so safely.  Exercise for at least 30 minutes for 5 or more days each week, or as told by your doctor.  Wear loose-fitting clothes.  Do not smoke. If you need help quitting, ask your doctor.  Sleep with the  head of your bed higher than your feet. Use a wedge under the mattress or blocks under the bed frame to raise the head of the bed. Summary  When you have gastroesophageal reflux disease (GERD), food and lifestyle choices are very important in easing your symptoms.  Eat small meals often instead of 3 large meals a day. Eat your meals slowly, and in a place where you are relaxed.  Limit high-fat foods such as fatty meat or fried foods.  Avoid bending over or lying down until 2-3 hours after eating.  Avoid peppermint and spearmint, caffeine, alcohol, and chocolate. This information is not intended to replace advice given to you by your health care provider. Make sure you discuss any questions you have with your health care provider. Document Revised: 03/21/2019 Document Reviewed: 01/03/2017 Elsevier Patient Education  [employer].

## 2020-10-07 ENCOUNTER — Other Ambulatory Visit
Admission: RE | Admit: 2020-10-07 | Discharge: 2020-10-07 | Disposition: A | Discharge status: Home / Self Care | Payer: 59 | Attending: Family Medicine | Admitting: Family Medicine

## 2020-10-07 ENCOUNTER — Telehealth: Payer: Self-pay

## 2020-10-07 ENCOUNTER — Encounter: Payer: Self-pay | Admitting: Family Medicine

## 2020-10-07 DIAGNOSIS — E559 Vitamin D deficiency, unspecified: Secondary | ICD-10-CM

## 2020-10-07 DIAGNOSIS — R109 Unspecified abdominal pain: Secondary | ICD-10-CM

## 2020-10-07 DIAGNOSIS — Z79899 Other long term (current) drug therapy: Secondary | ICD-10-CM | POA: Diagnosis not present

## 2020-10-07 LAB — CBC WITH DIFFERENTIAL/PLATELET
Abs Immature Granulocytes: 0.02 10*3/uL (ref 0.00–0.07)
Basophils Absolute: 0.1 10*3/uL (ref 0.0–0.1)
Basophils Relative: 1 %
Eosinophils Absolute: 0.5 10*3/uL (ref 0.0–0.5)
Eosinophils Relative: 5 %
HCT: 38.3 % (ref 36.0–46.0)
Hemoglobin: 12.7 g/dL (ref 12.0–15.0)
Immature Granulocytes: 0 %
Lymphocytes Relative: 39 %
Lymphs Abs: 3.6 10*3/uL (ref 0.7–4.0)
MCH: 29.1 pg (ref 26.0–34.0)
MCHC: 33.2 g/dL (ref 30.0–36.0)
MCV: 87.8 fL (ref 80.0–100.0)
Monocytes Absolute: 0.7 10*3/uL (ref 0.1–1.0)
Monocytes Relative: 8 %
Neutro Abs: 4.3 10*3/uL (ref 1.7–7.7)
Neutrophils Relative %: 47 %
Platelets: 264 10*3/uL (ref 150–400)
RBC: 4.36 MIL/uL (ref 3.87–5.11)
RDW: 13.1 % (ref 11.5–15.5)
WBC: 9.2 10*3/uL (ref 4.0–10.5)
nRBC: 0 % (ref 0.0–0.2)

## 2020-10-07 LAB — COMPREHENSIVE METABOLIC PANEL
ALT: 24 U/L (ref 0–44)
AST: 20 U/L (ref 15–41)
Albumin: 4.3 g/dL (ref 3.5–5.0)
Alkaline Phosphatase: 70 U/L (ref 38–126)
Anion gap: 8 (ref 5–15)
BUN: 14 mg/dL (ref 6–20)
CO2: 28 mmol/L (ref 22–32)
Calcium: 9.4 mg/dL (ref 8.9–10.3)
Chloride: 102 mmol/L (ref 98–111)
Creatinine, Ser: 0.71 mg/dL (ref 0.44–1.00)
GFR, Estimated: >60 mL/min (ref >60)
Glucose, Bld: 96 mg/dL (ref 70–99)
Potassium: 4 mmol/L (ref 3.5–5.1)
Sodium: 138 mmol/L (ref 135–145)
Total Bilirubin: 0.5 mg/dL (ref 0.3–1.2)
Total Protein: 6.9 g/dL (ref 6.5–8.1)

## 2020-10-07 LAB — LIPASE, BLOOD: Lipase: 32 U/L (ref 11–51)

## 2020-10-07 LAB — VITAMIN D 25 HYDROXY (VIT D DEFICIENCY, FRACTURES): Vit D, 25-Hydroxy: 30.32 ng/mL (ref 30–100)

## 2020-10-07 LAB — AMYLASE: Amylase: 47 U/L (ref 28–100)

## 2020-10-07 NOTE — Telephone Encounter (Signed)
Message for Nichole Brown---Patient was seen 10/26 and she had her labs faxed over for you to review in your box.

## 2020-10-20 ENCOUNTER — Encounter: Payer: Self-pay | Admitting: Family Medicine

## 2020-10-20 ENCOUNTER — Other Ambulatory Visit: Payer: Self-pay

## 2020-10-20 ENCOUNTER — Telehealth: Payer: Self-pay

## 2020-10-20 ENCOUNTER — Other Ambulatory Visit: Payer: Self-pay | Admitting: *Deleted

## 2020-10-20 ENCOUNTER — Ambulatory Visit (INDEPENDENT_AMBULATORY_CARE_PROVIDER_SITE_OTHER): Payer: 59 | Admitting: Family Medicine

## 2020-10-20 VITALS — BP 104/64 | HR 107 | Temp 98.4°F | Wt 162.4 lb

## 2020-10-20 DIAGNOSIS — Z0000 Encounter for general adult medical examination without abnormal findings: Secondary | ICD-10-CM

## 2020-10-20 DIAGNOSIS — F321 Major depressive disorder, single episode, moderate: Secondary | ICD-10-CM

## 2020-10-20 DIAGNOSIS — J310 Chronic rhinitis: Secondary | ICD-10-CM | POA: Diagnosis not present

## 2020-10-20 DIAGNOSIS — J329 Chronic sinusitis, unspecified: Secondary | ICD-10-CM

## 2020-10-20 DIAGNOSIS — M62838 Other muscle spasm: Secondary | ICD-10-CM | POA: Diagnosis not present

## 2020-10-20 DIAGNOSIS — F411 Generalized anxiety disorder: Secondary | ICD-10-CM

## 2020-10-20 MED ORDER — AMOXICILLIN-POT CLAVULANATE 875-125 MG PO TABS: 1.0000 | ORAL_TABLET | Freq: Two times a day (BID) | ORAL | 0 refills | Status: DC

## 2020-10-20 MED ORDER — TIZANIDINE HCL 4 MG PO TABS: ORAL_TABLET | ORAL | 2 refills | Status: DC

## 2020-10-20 MED ORDER — SERTRALINE HCL 50 MG PO TABS: 50.0000 mg | ORAL_TABLET | Freq: Every day | ORAL | 3 refills | Status: DC

## 2020-10-20 MED ORDER — SERTRALINE HCL 25 MG PO TABS: ORAL_TABLET | ORAL | 1 refills | Status: DC

## 2020-10-20 NOTE — Progress Notes (Signed)
Patient ID: Nichole Brown, female    DOB: 1966/04/17, 54 y.o.   MRN: [id number]   Chief Complaint  Patient presents with  . Annual Exam   Subjective:  Cc: WELLNESS  Presents today for her annual wellness exam.  She is also experiencing some depression and anxiety.  Wants me to look at a "sore "in her nose that has been previously treated.  Was last seen in this office on October 26, for GERD medication management altered at that time, that current regimen is helping.  Will hold off on GI referral for now.   The patient comes in today for a wellness visit.    A review of their health history was completed.  A review of medications was also completed.   Eating habits: pretty good   Falls/  MVA accidents in past few months: none  Regular exercise: walking daily  Specialist pt sees on regular basis: Neuro  Preventative health issues were discussed.   Additional concerns: reports a sore in her nose for several day, has been treated for MRSA.   Low Bp this morning and felt slightly dizzy.  Medical History Nichole Brown has a past medical history of Anxiety, Bronchitis, Complication of anesthesia, Depression, Fibromyalgia (2005), GERD (gastroesophageal reflux disease), Insomnia, Migraine headache, and Pneumonia.   Outpatient Encounter Medications as of 10/20/2020  Medication Sig  . albuterol (VENTOLIN HFA) 108 (90 Base) MCG/ACT inhaler Inhale 2 puffs into the lungs 4 (four) times daily as needed for wheezing or shortness of breath.  . ALPRAZolam (XANAX) 0.5 MG tablet Take one tablet po qhs prn anxiety  . amoxicillin-clavulanate (AUGMENTIN) 875-125 MG tablet Take 1 tablet by mouth 2 (two) times daily.  . clotrimazole-betamethasone (LOTRISONE) cream Apply 1 application topically 2 (two) times daily. To chest rash for 2 wks.  . famotidine (PEPCID) 20 MG tablet Take 1 tablet (20 mg total) by mouth 2 (two) times daily.  . fluticasone (FLOVENT HFA) 44 MCG/ACT inhaler Inhale 2 puffs into  the lungs in the morning and at bedtime.  . hydrocortisone (ANUSOL-HC) 2.5 % rectal cream Apply TID to affected area  . ondansetron (ZOFRAN-ODT) 4 MG disintegrating tablet Take 1 tablet (4 mg total) by mouth every 8 (eight) hours as needed for nausea or vomiting.  Marland Kitchen Propylene Glycol (SYSTANE BALANCE) 0.6 % SOLN Place 1 drop into both eyes daily as needed (dry eyes).  . rizatriptan (MAXALT-MLT) 10 MG disintegrating tablet Take 10 mg by mouth as needed for migraine. May repeat in 2 hours if needed  . sertraline (ZOLOFT) 25 MG tablet Take 1 tablet by mouth daily  . sucralfate (CARAFATE) 1 GM/10ML suspension Take 10 mLs (1 g total) by mouth 4 (four) times daily -  with meals and at bedtime.  Marland Kitchen tiZANidine (ZANAFLEX) 4 MG tablet Take one tablet po BID prn spasms  . Vitamin D, Ergocalciferol, (DRISDOL) 1.25 MG (50000 UT) CAPS capsule Take 50,000 Units by mouth every 7 (seven) days. Wednesdays  . [DISCONTINUED] benzonatate (TESSALON) 100 MG capsule Take 1 capsule (100 mg total) by mouth 2 (two) times daily as needed for cough.  . [DISCONTINUED] pantoprazole (PROTONIX) 40 MG tablet Take 1 tablet (40 mg total) by mouth daily. For acid reflux  . [DISCONTINUED] sertraline (ZOLOFT) 50 MG tablet Take 1 tablet (50 mg total) by mouth daily.  . [DISCONTINUED] trimethoprim-polymyxin b (POLYTRIM) ophthalmic solution Place 1 drop into both eyes every 4 (four) hours.   No facility-administered encounter medications on file as of 10/20/2020.  Review of Systems  Constitutional: Negative for chills and fever.  HENT: Positive for sinus pressure and sinus pain. Negative for ear pain and sore throat.   Respiratory: Negative for shortness of breath.   Cardiovascular: Negative for chest pain.  Gastrointestinal: Negative for abdominal pain.  Hematological: Negative for adenopathy.  Psychiatric/Behavioral: Positive for sleep disturbance. Negative for self-injury and suicidal ideas. The patient is nervous/anxious.         2016/03/09 her son died, and then this summer close family member died due to drowning.      Vitals BP 104/64   Pulse (!) 107   Temp 98.4 F (36.9 C)   Wt 162 lb 6.4 oz (73.7 kg)   SpO2 98%   BMI 29.70 kg/m   Objective:   Physical Exam Nursing note reviewed.  Constitutional:      General: She is not in acute distress.    Appearance: She is not ill-appearing.  HENT:     Right Ear: Tympanic membrane normal.     Left Ear: Tympanic membrane normal.     Nose: Nose normal.     Comments: "feels a sore" inside nose. Not visualize.    Mouth/Throat:     Mouth: Mucous membranes are moist.     Pharynx: Oropharynx is clear.  Cardiovascular:     Rate and Rhythm: Normal rate and regular rhythm.     Heart sounds: Normal heart sounds.  Pulmonary:     Effort: Pulmonary effort is normal.     Breath sounds: Normal breath sounds.  Abdominal:     General: Bowel sounds are normal.     Palpations: Abdomen is soft.  Skin:    General: Skin is warm and dry.  Neurological:     Mental Status: She is alert and oriented to person, place, and time.  Psychiatric:        Behavior: Behavior normal.        Thought Content: Thought content normal.        Judgment: Judgment normal.     Comments: Feels depressed and anxious PHQ-9: 16 GAD 7: 14      Assessment and Plan   1. Wellness examination  2. Depression, major, single episode, moderate (HCC) - sertraline (ZOLOFT) 25 MG tablet; Take 1 tablet by mouth daily  Dispense: 30 tablet; Refill: 1  3. Rhinosinusitis - amoxicillin-clavulanate (AUGMENTIN) 875-125 MG tablet; Take 1 tablet by mouth 2 (two) times daily.  Dispense: 20 tablet; Refill: 0   She has significant depression and anxiety, has a history of losing a child, and another close member of her family died suddenly this past summer.  This has been affecting her adversely.  She has contact information for a mental health counselor, I strongly encouraged her to seek that help immediately.  She  denies suicidal or homicidal thoughts.  Will start sertraline today.  Precautions given concerning suicidal thoughts.  She understands that it takes several weeks for this medication to become therapeutic, and understands that she may feel worse before she feels better.  Strict instructions given for higher level of care if she should experience suicidal thoughts.  She is instructed to go immediately to the emergency department.  Upon examination, she is noted to have significant frontal and maxillary sinus pain and pressure, will treat for rhinosinusitis.  She reports that she does get a sinus infection every year around this time.  We reviewed her labs today in detail.  Her vitamin D level has been severely low in the  past, today it is 30.32 she is encouraged to continue her vitamin D supplementation that she takes.  She is encouraged to get out in the sunlight every day.   Her specialist include: Neurology for migraines.  She is strongly encouraged to seek mental health counseling and she agrees to do so.  In addition to mental health counseling, she is encouraged to continue with physical activity every day, get outside in the sunlight, and to consider yoga.  Medication refills sent.  Agrees with plan of care discussed today. Understands warning signs to seek further care: Chest pain, shortness of breath, any thoughts of suicide/homicide.  She is instructed to go to the emergency room immediately if this should occur. Understands to follow-up in 1 month for depression.

## 2020-10-20 NOTE — Telephone Encounter (Signed)
Ok to put in this referral?

## 2020-10-20 NOTE — Telephone Encounter (Signed)
Pt was seen today and needs referral to [facility] Fax number [phone]  Pt call back [phone]

## 2020-10-20 NOTE — Patient Instructions (Addendum)
Stay hydrated. Connect with a counselor ASAP!!! Start medication for depression. Follow-up in one month.     Complicated Grief Grief is a normal response to the death of someone close to you. Feelings of fear, anger, and guilt can affect almost everyone who loses a loved one. It is also common to have symptoms of depression while you are grieving. These include problems with sleep, loss of appetite, and lack of energy. They may last for weeks or months after a loss. Complicated grief is different from normal grief or depression. Normal grieving involves sadness and feelings of loss, but those feelings get better and heal over time. Complicated grief is a severe type of grief that lasts for a long time, usually for several months to a year or longer. It interferes with your ability to function normally. Complicated grief may require treatment from a mental health care provider. What are the causes? The cause of this condition is not known. It is not clear why some people continue to struggle with grief and others do not. What increases the risk? You are more likely to develop this condition if:  The death of your loved one was sudden or unexpected.  The death of your loved one was due to a violent event.  Your loved one died from suicide.  Your loved one was a child or a young person.  You were very close to your loved one, or you were dependent on him or her.  You have a history of depression or anxiety. What are the signs or symptoms? Symptoms of this condition include:  Feeling disbelief or having a lack of emotion (numbness).  Being unable to enjoy good memories of your loved one.  Needing to avoid anything or anyone that reminds you of your loved one.  Being unable to stop thinking about the death.  Feeling intense anger or guilt.  Feeling alone and hopeless.  Feeling that your life is meaningless and empty.  Losing the desire to move on with your life. How is this  diagnosed? This condition may be diagnosed based on:  Your symptoms. Complicated grief will be diagnosed if you have ongoing symptoms of grief for 6-12 months or longer.  The effect of symptoms on your life. You may be diagnosed with this condition if your symptoms are interfering with your ability to live your life. Your health care provider may recommend that you see a mental health care provider. Many symptoms of depression are similar to the symptoms of complicated grief. It is important to be evaluated for complicated grief along with other mental health conditions. How is this treated? This condition is most commonly treated with talk therapy. This therapy is offered by a mental health specialist (psychiatrist). During therapy:  You will learn healthy ways to cope with the loss of your loved one.  Your mental health care provider may recommend antidepressant medicines. Follow these instructions at home: Lifestyle   Take care of yourself. ? Eat on a regular basis, and maintain a healthy diet. Eat plenty of fruits, vegetables, lean protein, and whole grains. ? Try to get some exercise each day. Aim for 30 minutes of exercise on most days of the week. ? Keep a consistent sleep schedule. Try to get 8 or more hours of sleep each night. ? Start doing the things that you used to enjoy.  Do not use drugs or alcohol to ease your symptoms.  Spend time with friends and loved ones. General instructions  Take over-the-counter  and prescription medicines only as told by your health care provider.  Consider joining a grief (bereavement) support group to help you deal with your loss.  Keep all follow-up visits as told by your health care provider. This is important. Contact a health care provider if:  Your symptoms prevent you from functioning normally.  Your symptoms do not get better with treatment. Get help right away if:  You have serious thoughts about hurting yourself or someone  else.  You have suicidal feelings. If you ever feel like you may hurt yourself or others, or have thoughts about taking your own life, get help right away. You can go to your nearest emergency department or call:  Your local emergency services (911 in the [place]  A suicide crisis helpline, such as the [place] at (606)781-5410. This is open 24 hours a day. Summary  Complicated grief is a severe type of grief that lasts for a long time. This grief is not likely to go away on its own. Get the help you need.  Some griefs are more difficult than others and can cause this condition. You may need a certain type of treatment to help you recover if the loss of your loved one was sudden, violent, or due to suicide.  You may feel guilty about moving on with your life. Getting help does not mean that you are forgetting your loved one. It means that you are taking care of yourself.  Complicated grief is best treated with talk therapy. Medicines may also be prescribed.  Seek the help you need, and find support that will help you recover. This information is not intended to replace advice given to you by your health care provider. Make sure you discuss any questions you have with your health care provider. Document Revised: 11/10/2017 Document Reviewed: 09/13/2017 Elsevier Patient Education  2020 Elsevier Inc.  Preventive Care 57-54 Years Old, Female Preventive care refers to visits with your health care provider and lifestyle choices that can promote health and wellness. This includes:  A yearly physical exam. This may also be called an annual well check.  Regular dental visits and eye exams.  Immunizations.  Screening for certain conditions.  Healthy lifestyle choices, such as eating a healthy diet, getting regular exercise, not using drugs or products that contain nicotine and tobacco, and limiting alcohol use. What can I expect for my preventive care  visit? Physical exam Your health care provider will check your:  Height and weight. This may be used to calculate body mass index (BMI), which tells if you are at a healthy weight.  Heart rate and blood pressure.  Skin for abnormal spots. Counseling Your health care provider may ask you questions about your:  Alcohol, tobacco, and drug use.  Emotional well-being.  Home and relationship well-being.  Sexual activity.  Eating habits.  Work and work Statistician.  Method of birth control.  Menstrual cycle.  Pregnancy history. What immunizations do I need?  Influenza (flu) vaccine  This is recommended every year. Tetanus, diphtheria, and pertussis (Tdap) vaccine  You may need a Td booster every 10 years. Varicella (chickenpox) vaccine  You may need this if you have not been vaccinated. Zoster (shingles) vaccine  You may need this after age 36. Measles, mumps, and rubella (MMR) vaccine  You may need at least one dose of MMR if you were born in 1957 or later. You may also need a second dose. Pneumococcal conjugate (PCV13) vaccine  You may need  this if you have certain conditions and were not previously vaccinated. Pneumococcal polysaccharide (PPSV23) vaccine  You may need one or two doses if you smoke cigarettes or if you have certain conditions. Meningococcal conjugate (MenACWY) vaccine  You may need this if you have certain conditions. Hepatitis A vaccine  You may need this if you have certain conditions or if you travel or work in places where you may be exposed to hepatitis A. Hepatitis B vaccine  You may need this if you have certain conditions or if you travel or work in places where you may be exposed to hepatitis B. Haemophilus influenzae type b (Hib) vaccine  You may need this if you have certain conditions. Human papillomavirus (HPV) vaccine  If recommended by your health care provider, you may need three doses over 6 months. You may receive  vaccines as individual doses or as more than one vaccine together in one shot (combination vaccines). Talk with your health care provider about the risks and benefits of combination vaccines. What tests do I need? Blood tests  Lipid and cholesterol levels. These may be checked every 5 years, or more frequently if you are over 94 years old.  Hepatitis C test.  Hepatitis B test. Screening  Lung cancer screening. You may have this screening every year starting at age 52 if you have a 30-pack-year history of smoking and currently smoke or have quit within the past 15 years.  Colorectal cancer screening. All adults should have this screening starting at age 41 and continuing until age 64. Your health care provider may recommend screening at age 31 if you are at increased risk. You will have tests every 1-10 years, depending on your results and the type of screening test.  Diabetes screening. This is done by checking your blood sugar (glucose) after you have not eaten for a while (fasting). You may have this done every 1-3 years.  Mammogram. This may be done every 1-2 years. Talk with your health care provider about when you should start having regular mammograms. This may depend on whether you have a family history of breast cancer.  BRCA-related cancer screening. This may be done if you have a family history of breast, ovarian, tubal, or peritoneal cancers.  Pelvic exam and Pap test. This may be done every 3 years starting at age 74. Starting at age 15, this may be done every 5 years if you have a Pap test in combination with an HPV test. Other tests  Sexually transmitted disease (STD) testing.  Bone density scan. This is done to screen for osteoporosis. You may have this scan if you are at high risk for osteoporosis. Follow these instructions at home: Eating and drinking  Eat a diet that includes fresh fruits and vegetables, whole grains, lean protein, and low-fat dairy.  Take vitamin  and mineral supplements as recommended by your health care provider.  Do not drink alcohol if: ? Your health care provider tells you not to drink. ? You are pregnant, may be pregnant, or are planning to become pregnant.  If you drink alcohol: ? Limit how much you have to 0-1 drink a day. ? Be aware of how much alcohol is in your drink. In the [place] one drink equals one 12 oz bottle of beer (355 mL), one 5 oz glass of wine (148 mL), or one 1 oz glass of hard liquor (44 mL). Lifestyle  Take daily care of your teeth and gums.  Stay active. Exercise for at  least 30 minutes on 5 or more days each week.  Do not use any products that contain nicotine or tobacco, such as cigarettes, e-cigarettes, and chewing tobacco. If you need help quitting, ask your health care provider.  If you are sexually active, practice safe sex. Use a condom or other form of birth control (contraception) in order to prevent pregnancy and STIs (sexually transmitted infections).  If told by your health care provider, take low-dose aspirin daily starting at age 67. What's next?  Visit your health care provider once a year for a well check visit.  Ask your health care provider how often you should have your eyes and teeth checked.  Stay up to date on all vaccines. This information is not intended to replace advice given to you by your health care provider. Make sure you discuss any questions you have with your health care provider. Document Revised: 08/09/2018 Document Reviewed: 08/09/2018 Elsevier Patient Education  2020 [employer].

## 2020-10-25 ENCOUNTER — Encounter: Payer: Self-pay | Admitting: Family Medicine

## 2020-10-26 ENCOUNTER — Telehealth: Payer: Self-pay

## 2020-10-26 NOTE — Telephone Encounter (Signed)
Pt called she seen Santiago Glad last week and was prescribed an antibiotic which upset her stomach over the weekend she contacted the pharmacy and they have instructed her not to take dairy with the antibiotic. Pt sent MyChart message to Santiago Glad and she was ask to call and make appointment. Pt feels she does not need appt and if she needs to be tested for Covid can you send request to [facility] she works there.   Pt call back [phone] Or office number [phone]

## 2020-10-27 NOTE — Telephone Encounter (Signed)
Please ask if her symptoms have resolved since stopping the meds. I can try another antibiotic if needed, but abx tend to upset the stomach. She can try probiotics for the gut flora.  Thanks, Santiago Glad

## 2020-10-27 NOTE — Telephone Encounter (Signed)
Patient states she is still taking the antibiotic. She was informed by the pharmacy not to have any milk products while taking the med and that has helped a lot. She will let us know if anything changes.

## 2020-11-17 ENCOUNTER — Encounter: Payer: Self-pay | Admitting: Gastroenterology

## 2020-11-17 ENCOUNTER — Ambulatory Visit: Payer: 59 | Admitting: Gastroenterology

## 2020-11-17 ENCOUNTER — Other Ambulatory Visit: Payer: Self-pay | Admitting: Gastroenterology

## 2020-11-17 ENCOUNTER — Telehealth: Payer: Self-pay

## 2020-11-17 ENCOUNTER — Other Ambulatory Visit: Payer: Self-pay

## 2020-11-17 VITALS — BP 125/78 | HR 62 | Temp 96.9°F | Ht 62.0 in | Wt 165.0 lb

## 2020-11-17 DIAGNOSIS — K219 Gastro-esophageal reflux disease without esophagitis: Secondary | ICD-10-CM

## 2020-11-17 DIAGNOSIS — R1310 Dysphagia, unspecified: Secondary | ICD-10-CM | POA: Insufficient documentation

## 2020-11-17 DIAGNOSIS — R1011 Right upper quadrant pain: Secondary | ICD-10-CM | POA: Diagnosis not present

## 2020-11-17 MED ORDER — PANTOPRAZOLE SODIUM 40 MG PO TBEC: 40.0000 mg | DELAYED_RELEASE_TABLET | Freq: Every day | ORAL | 3 refills | Status: DC

## 2020-11-17 NOTE — H&P (View-Only) (Signed)
Referring Provider: Erven Colla, DO Primary Care Physician:  Erven Colla, DO Primary GI: Dr. Abbey Chatters  Chief Complaint  Patient presents with  . Gastroesophageal Reflux    reflux daily with everything she eats    HPI:   Nichole Brown is a 54 y.o. female presenting today with a history of multiple polyps, with surveillance due in Dec 2020, now with dyspepsia.    In Jan 2020: in hospital with chest pain, all evals unremarkable and responded well to Protonix. She is no longer taking a PPI.   Has intermittent epigastric pain, belching, gassy. Almost daily. Radiates around to RUQ. Postprandial component. Gallbladder absent. Recently HFP normal. Taking a probiotic. Noticed improvement with PPI but still present. Eats fast. Has to slow down or she feels like it gets lodged in esophagus. Takes Tylenol. Stopped taking Ibuprofen when Covid came about. Trying to change up diet. Feels like she has a gallstone lodged. Feels like she did prior to cholecystectomy.     Past Medical History:  Diagnosis Date  . Anxiety   . Bronchitis   . Complication of anesthesia    PONV  . Depression   . Fibromyalgia 2005  . GERD (gastroesophageal reflux disease)   . Insomnia   . Migraine headache   . Pneumonia     Past Surgical History:  Procedure Laterality Date  . ABDOMINAL HYSTERECTOMY    . CESAREAN SECTION    . CHOLECYSTECTOMY  1988  . COLONOSCOPY N/A 12/10/2018    six 4-8 mm polyps in proximal descending colon, mid transverse and hepatic flexure. Diverticulosis. External and internal hemorrhoids. Torturous colon. Two simple adenomas, two serrated polyps, 2 hyperplastic polyps. Surveillance due Dec 2022.   Marland Kitchen open heart surgery  1967  . PATENT DUCTUS ARTERIOUS REPAIR  1967  . POLYPECTOMY  12/10/2018   Procedure: POLYPECTOMY;  Surgeon: Danie Binder, MD;  Location[facility];  Service: Endoscopy;;  hepatic flexure, descending, transverse  . SHOULDER ARTHROSCOPY Left 06/16/2017    Procedure: LEFT SHOULDER ARTHROSCOPY, DEBRIDEMENT, AND DECOMPRESSION;  Surgeon: Newt Minion, MD;  Location: [facility];  Service: Orthopedics;  Laterality: Left;  . SHOULDER ARTHROSCOPY WITH SUBACROMIAL DECOMPRESSION Left 10/03/2019   Procedure: SHOULDER ARTHROSCOPY WITH DEBRIDEMENT, DECOMPRESSION;  Surgeon: Corky Mull, MD;  Location: [facility];  Service: Orthopedics;  Laterality: Left;  . SHOULDER SURGERY Left 2008   [facility] joint    Current Outpatient Medications  Medication Sig Dispense Refill  . albuterol (VENTOLIN HFA) 108 (90 Base) MCG/ACT inhaler Inhale 2 puffs into the lungs 4 (four) times daily as needed for wheezing or shortness of breath. 18 g 2  . famotidine (PEPCID) 20 MG tablet Take 1 tablet (20 mg total) by mouth 2 (two) times daily. 60 tablet 1  . fluticasone (FLOVENT HFA) 44 MCG/ACT inhaler Inhale 2 puffs into the lungs in the morning and at bedtime. 1 Inhaler 2  . ondansetron (ZOFRAN-ODT) 4 MG disintegrating tablet Take 1 tablet (4 mg total) by mouth every 8 (eight) hours as needed for nausea or vomiting. 20 tablet 0  . Propylene Glycol (SYSTANE BALANCE) 0.6 % SOLN Place 1 drop into both eyes daily as needed (dry eyes).    . rizatriptan (MAXALT-MLT) 10 MG disintegrating tablet Take 10 mg by mouth as needed for migraine. May repeat in 2 hours if needed    . sertraline (ZOLOFT) 25 MG tablet Take 1 tablet by mouth daily 30 tablet 1  . sucralfate (CARAFATE) 1 GM/10ML suspension  Take 10 mLs (1 g total) by mouth 4 (four) times daily -  with meals and at bedtime. 420 mL 0  . tiZANidine (ZANAFLEX) 4 MG tablet Take one tablet po BID prn spasms 36 tablet 2  . Vitamin D, Ergocalciferol, (DRISDOL) 1.25 MG (50000 UT) CAPS capsule Take 50,000 Units by mouth every 7 (seven) days. Wednesdays    . pantoprazole (PROTONIX) 40 MG tablet Take 1 tablet (40 mg total) by mouth daily. Take 30 minutes before breakfast 30 tablet 3   No current facility-administered medications for this visit.    Allergies  as of 11/17/2020 - Review Complete 11/17/2020  Allergen Reaction Noted  . Cymbalta [duloxetine hcl] Other (See Comments) 02/09/2017  . Nortriptyline Nausea And Vomiting 02/09/2017  . Phenergan [promethazine hcl]  12/10/2018    Family History  Problem Relation Age of Onset  . Hypertension Maternal Grandmother   . Diabetes Maternal Grandmother   . Heart disease Maternal Grandmother 40       MI  . Hypertension Maternal Grandfather   . Throat cancer Maternal Grandfather   . Breast cancer Mother 53  . Hypertension Father   . Colon cancer Neg Hx     Social History   Socioeconomic History  . Marital status: Married    Spouse name: Not on file  . Number of children: Not on file  . Years of education: Not on file  . Highest education level: Not on file  Occupational History  . Not on file  Tobacco Use  . Smoking status: Former Research scientist (life sciences)  . Smokeless tobacco: Never Used  Vaping Use  . Vaping Use: Never used  Substance and Sexual Activity  . Alcohol use: Yes    Comment: occassionally  . Drug use: No  . Sexual activity: Yes    Birth control/protection: Surgical  Other Topics Concern  . Not on file  Social History Narrative  . Not on file   Social Determinants of Health   Financial Resource Strain:   . Difficulty of Paying Living Expenses: Not on file  Food Insecurity:   . Worried About Charity fundraiser in the Last Year: Not on file  . Ran Out of Food in the Last Year: Not on file  Transportation Needs:   . Lack of Transportation (Medical): Not on file  . Lack of Transportation (Non-Medical): Not on file  Physical Activity:   . Days of Exercise per Week: Not on file  . Minutes of Exercise per Session: Not on file  Stress:   . Feeling of Stress : Not on file  Social Connections:   . Frequency of Communication with Friends and Family: Not on file  . Frequency of Social Gatherings with Friends and Family: Not on file  . Attends Religious Services: Not on file  .  Active Member of Clubs or Organizations: Not on file  . Attends Archivist Meetings: Not on file  . Marital Status: Not on file    Review of Systems: Gen: Denies fever, chills, anorexia. Denies fatigue, weakness, weight loss.  CV: Denies chest pain, palpitations, syncope, peripheral edema, and claudication. Resp: Denies dyspnea at rest, cough, wheezing, coughing up blood, and pleurisy. GI: see HPI Derm: Denies rash, itching, dry skin Psych: Denies depression, anxiety, memory loss, confusion. No homicidal or suicidal ideation.  Heme: Denies bruising, bleeding, and enlarged lymph nodes.  Physical Exam: BP 125/78   Pulse 62   Temp (!) 96.9 F (36.1 C)   Ht 5\' 2"  (1.575  m)   Wt 165 lb (74.8 kg)   BMI 30.18 kg/m  General:   Alert and oriented. No distress noted. Pleasant and cooperative.  Head:  Normocephalic and atraumatic. Eyes:  Conjuctiva clear without scleral icterus. Mouth:  Mask in place Cardiac: S1 S2 present without murmurs Lungs: clear bilaterally Abdomen:  +BS, soft, non-tender and non-distended. No rebound or guarding. No HSM or masses noted. Msk:  Symmetrical without gross deformities. Normal posture. Extremities:  Without edema. Neurologic:  Alert and  oriented x4 Psych:  Alert and cooperative. Normal mood and affect.  ASSESSMENT: Nichole Brown is a 54 y.o. female presenting today with new onset epigastric/RUQ pain, GERD, with just slight response to PPI and currently not taking PPI routinely. Pepcid BID without improvement. Denying NSAID use.  Interestingly, she reports this feels similar to biliary symptoms prior to cholecystectomy years ago; her recent HFP is normal. Doubt retained stone: will pursue RUQ [place] in interim.   Vague solid food dysphagia noted. Will pursue EGD/dilation due to dyspepsia and dysphagia. Start PPI now.    PLAN:  Proceed with upper endoscopy/dilation by Dr. Abbey Chatters in near future: the risks, benefits, and alternatives have been  discussed with the patient in detail. The patient states understanding and desires to proceed.   Protonix daily: sent to pharmacy  RUQ [place]  3 month follow-up  Colonoscopy Dec 2022  Further recommendations to follow  Annitta Needs, PhD, ANP-BC [facility]

## 2020-11-17 NOTE — Patient Instructions (Addendum)
We are arranging an upper endoscopy with dilation by Dr. Abbey Chatters.  I sent in Protonix to take once each morning, 30 minutes before breakfast. You can take Pepcid as needed, but let's try Protonix first.  We are arranging an ultrasound.  We will see you back in 3 months!  It was a pleasure to see you today. I want to create trusting relationships with patients to provide genuine, compassionate, and quality care. I value your feedback. If you receive a survey regarding your visit,  I greatly appreciate you taking time to fill this out.   Annitta Needs, PhD, ANP-BC [facility]    Gastroesophageal Reflux Disease, Adult Gastroesophageal reflux (GER) happens when acid from the stomach flows up into the tube that connects the mouth and the stomach (esophagus). Normally, food travels down the esophagus and stays in the stomach to be digested. However, when a person has GER, food and stomach acid sometimes move back up into the esophagus. If this becomes a more serious problem, the person may be diagnosed with a disease called gastroesophageal reflux disease (GERD). GERD occurs when the reflux:  Happens often.  Causes frequent or severe symptoms.  Causes problems such as damage to the esophagus. When stomach acid comes in contact with the esophagus, the acid may cause soreness (inflammation) in the esophagus. Over time, GERD may create small holes (ulcers) in the lining of the esophagus. What are the causes? This condition is caused by a problem with the muscle between the esophagus and the stomach (lower esophageal sphincter, or LES). Normally, the LES muscle closes after food passes through the esophagus to the stomach. When the LES is weakened or abnormal, it does not close properly, and that allows food and stomach acid to go back up into the esophagus. The LES can be weakened by certain dietary substances, medicines, and medical conditions, including:  Tobacco  use.  Pregnancy.  Having a hiatal hernia.  Alcohol use.  Certain foods and beverages, such as coffee, chocolate, onions, and peppermint. What increases the risk? You are more likely to develop this condition if you:  Have an increased body weight.  Have a connective tissue disorder.  Use NSAID medicines. What are the signs or symptoms? Symptoms of this condition include:  Heartburn.  Difficult or painful swallowing.  The feeling of having a lump in the throat.  Abitter taste in the mouth.  Bad breath.  Having a large amount of saliva.  Having an upset or bloated stomach.  Belching.  Chest pain. Different conditions can cause chest pain. Make sure you see your health care provider if you experience chest pain.  Shortness of breath or wheezing.  Ongoing (chronic) cough or a night-time cough.  Wearing away of tooth enamel.  Weight loss. How is this diagnosed? Your health care provider will take a medical history and perform a physical exam. To determine if you have mild or severe GERD, your health care provider may also monitor how you respond to treatment. You may also have tests, including:  A test to examine your stomach and esophagus with a small camera (endoscopy).  A test thatmeasures the acidity level in your esophagus.  A test thatmeasures how much pressure is on your esophagus.  A barium swallow or modified barium swallow test to show the shape, size, and functioning of your esophagus. How is this treated? The goal of treatment is to help relieve your symptoms and to prevent complications. Treatment for this condition may vary depending on  how severe your symptoms are. Your health care provider may recommend:  Changes to your diet.  Medicine.  Surgery. Follow these instructions at home: Eating and drinking   Follow a diet as recommended by your health care provider. This may involve avoiding foods and drinks such as: ? Coffee and tea (with  or without caffeine). ? Drinks that containalcohol. ? Energy drinks and sports drinks. ? Carbonated drinks or sodas. ? Chocolate and cocoa. ? Peppermint and mint flavorings. ? Garlic and onions. ? Horseradish. ? Spicy and acidic foods, including peppers, chili powder, curry powder, vinegar, hot sauces, and barbecue sauce. ? Citrus fruit juices and citrus fruits, such as oranges, lemons, and limes. ? Tomato-based foods, such as red sauce, chili, salsa, and pizza with red sauce. ? Fried and fatty foods, such as donuts, french fries, potato chips, and high-fat dressings. ? High-fat meats, such as hot dogs and fatty cuts of red and white meats, such as rib eye steak, sausage, ham, and bacon. ? High-fat dairy items, such as whole milk, butter, and cream cheese.  Eat small, frequent meals instead of large meals.  Avoid drinking large amounts of liquid with your meals.  Avoid eating meals during the 2-3 hours before bedtime.  Avoid lying down right after you eat.  Do not exercise right after you eat. Lifestyle   Do not use any products that contain nicotine or tobacco, such as cigarettes, e-cigarettes, and chewing tobacco. If you need help quitting, ask your health care provider.  Try to reduce your stress by using methods such as yoga or meditation. If you need help reducing stress, ask your health care provider.  If you are overweight, reduce your weight to an amount that is healthy for you. Ask your health care provider for guidance about a safe weight loss goal. General instructions  Pay attention to any changes in your symptoms.  Take over-the-counter and prescription medicines only as told by your health care provider. Do not take aspirin, ibuprofen, or other NSAIDs unless your health care provider told you to do so.  Wear loose-fitting clothing. Do not wear anything tight around your waist that causes pressure on your abdomen.  Raise (elevate) the head of your bed about 6  inches (15 cm).  Avoid bending over if this makes your symptoms worse.  Keep all follow-up visits as told by your health care provider. This is important. Contact a health care provider if:  You have: ? New symptoms. ? Unexplained weight loss. ? Difficulty swallowing or it hurts to swallow. ? Wheezing or a persistent cough. ? A hoarse voice.  Your symptoms do not improve with treatment. Get help right away if you:  Have pain in your arms, neck, jaw, teeth, or back.  Feel sweaty, dizzy, or light-headed.  Have chest pain or shortness of breath.  Vomit and your vomit looks like blood or coffee grounds.  Faint.  Have stool that is bloody or black.  Cannot swallow, drink, or eat. Summary  Gastroesophageal reflux happens when acid from the stomach flows up into the esophagus. GERD is a disease in which the reflux happens often, causes frequent or severe symptoms, or causes problems such as damage to the esophagus.  Treatment for this condition may vary depending on how severe your symptoms are. Your health care provider may recommend diet and lifestyle changes, medicine, or surgery.  Contact a health care provider if you have new or worsening symptoms.  Take over-the-counter and prescription medicines only as told  by your health care provider. Do not take aspirin, ibuprofen, or other NSAIDs unless your health care provider told you to do so.  Keep all follow-up visits as told by your health care provider. This is important. This information is not intended to replace advice given to you by your health care provider. Make sure you discuss any questions you have with your health care provider. Document Revised: 06/06/2018 Document Reviewed: 06/06/2018 Elsevier Patient Education  [place].

## 2020-11-17 NOTE — Progress Notes (Signed)
Cc'ed to pcp °

## 2020-11-17 NOTE — Telephone Encounter (Signed)
PA for EGD/DIL submitted via [facility] website. Case ID: [id number]

## 2020-11-17 NOTE — Progress Notes (Signed)
Referring Provider: Erven Colla, DO Primary Care Physician:  Erven Colla, DO Primary GI: Dr. Abbey Chatters  Chief Complaint  Patient presents with  . Gastroesophageal Reflux    reflux daily with everything she eats    HPI:   SHAMEL GALYEAN is a 54 y.o. female presenting today with a history of multiple polyps, with surveillance due in Dec 2020, now with dyspepsia.    In Jan 2020: in hospital with chest pain, all evals unremarkable and responded well to Protonix. She is no longer taking a PPI.   Has intermittent epigastric pain, belching, gassy. Almost daily. Radiates around to RUQ. Postprandial component. Gallbladder absent. Recently HFP normal. Taking a probiotic. Noticed improvement with PPI but still present. Eats fast. Has to slow down or she feels like it gets lodged in esophagus. Takes Tylenol. Stopped taking Ibuprofen when Covid came about. Trying to change up diet. Feels like she has a gallstone lodged. Feels like she did prior to cholecystectomy.     Past Medical History:  Diagnosis Date  . Anxiety   . Bronchitis   . Complication of anesthesia    PONV  . Depression   . Fibromyalgia 2005  . GERD (gastroesophageal reflux disease)   . Insomnia   . Migraine headache   . Pneumonia     Past Surgical History:  Procedure Laterality Date  . ABDOMINAL HYSTERECTOMY    . CESAREAN SECTION    . CHOLECYSTECTOMY  1988  . COLONOSCOPY N/A 12/10/2018    six 4-8 mm polyps in proximal descending colon, mid transverse and hepatic flexure. Diverticulosis. External and internal hemorrhoids. Torturous colon. Two simple adenomas, two serrated polyps, 2 hyperplastic polyps. Surveillance due Dec 2022.   [facility] Kitchen open heart surgery  1967  . PATENT DUCTUS ARTERIOUS REPAIR  1967  . POLYPECTOMY  12/10/2018   Procedure: POLYPECTOMY;  Surgeon: Danie Binder, MD;  [facility];  Service: Endoscopy;;  hepatic flexure, descending, transverse  . SHOULDER ARTHROSCOPY Left 06/16/2017    Procedure: LEFT SHOULDER ARTHROSCOPY, DEBRIDEMENT, AND DECOMPRESSION;  Surgeon: Newt Minion, MD;  Location: [place];  Service: Orthopedics;  Laterality: Left;  . SHOULDER ARTHROSCOPY WITH SUBACROMIAL DECOMPRESSION Left 10/03/2019   Procedure: SHOULDER ARTHROSCOPY WITH DEBRIDEMENT, DECOMPRESSION;  Surgeon: Corky Mull, MD;  Location: [facility];  Service: Orthopedics;  Laterality: Left;  . SHOULDER SURGERY Left 2008   [facility] joint    Current Outpatient Medications  Medication Sig Dispense Refill  . albuterol (VENTOLIN HFA) 108 (90 Base) MCG/ACT inhaler Inhale 2 puffs into the lungs 4 (four) times daily as needed for wheezing or shortness of breath. 18 g 2  . famotidine (PEPCID) 20 MG tablet Take 1 tablet (20 mg total) by mouth 2 (two) times daily. 60 tablet 1  . fluticasone (FLOVENT HFA) 44 MCG/ACT inhaler Inhale 2 puffs into the lungs in the morning and at bedtime. 1 Inhaler 2  . ondansetron (ZOFRAN-ODT) 4 MG disintegrating tablet Take 1 tablet (4 mg total) by mouth every 8 (eight) hours as needed for nausea or vomiting. 20 tablet 0  . Propylene Glycol (SYSTANE BALANCE) 0.6 % SOLN Place 1 drop into both eyes daily as needed (dry eyes).    . rizatriptan (MAXALT-MLT) 10 MG disintegrating tablet Take 10 mg by mouth as needed for migraine. May repeat in 2 hours if needed    . sertraline (ZOLOFT) 25 MG tablet Take 1 tablet by mouth daily 30 tablet 1  . sucralfate (CARAFATE) 1 GM/10ML suspension  Take 10 mLs (1 g total) by mouth 4 (four) times daily -  with meals and at bedtime. 420 mL 0  . tiZANidine (ZANAFLEX) 4 MG tablet Take one tablet po BID prn spasms 36 tablet 2  . Vitamin D, Ergocalciferol, (DRISDOL) 1.25 MG (50000 UT) CAPS capsule Take 50,000 Units by mouth every 7 (seven) days. Wednesdays    . pantoprazole (PROTONIX) 40 MG tablet Take 1 tablet (40 mg total) by mouth daily. Take 30 minutes before breakfast 30 tablet 3   No current facility-administered medications for this visit.    Allergies  as of 11/17/2020 - Review Complete 11/17/2020  Allergen Reaction Noted  . Cymbalta [duloxetine hcl] Other (See Comments) 02/09/2017  . Nortriptyline Nausea And Vomiting 02/09/2017  . Phenergan [promethazine hcl]  12/10/2018    Family History  Problem Relation Age of Onset  . Hypertension Maternal Grandmother   . Diabetes Maternal Grandmother   . Heart disease Maternal Grandmother 40       MI  . Hypertension Maternal Grandfather   . Throat cancer Maternal Grandfather   . Breast cancer Mother 6  . Hypertension Father   . Colon cancer Neg Hx     Social History   Socioeconomic History  . Marital status: Married    Spouse name: Not on file  . Number of children: Not on file  . Years of education: Not on file  . Highest education level: Not on file  Occupational History  . Not on file  Tobacco Use  . Smoking status: Former Research scientist (life sciences)  . Smokeless tobacco: Never Used  Vaping Use  . Vaping Use: Never used  Substance and Sexual Activity  . Alcohol use: Yes    Comment: occassionally  . Drug use: No  . Sexual activity: Yes    Birth control/protection: Surgical  Other Topics Concern  . Not on file  Social History Narrative  . Not on file   Social Determinants of Health   Financial Resource Strain:   . Difficulty of Paying Living Expenses: Not on file  Food Insecurity:   . Worried About Charity fundraiser in the Last Year: Not on file  . Ran Out of Food in the Last Year: Not on file  Transportation Needs:   . Lack of Transportation (Medical): Not on file  . Lack of Transportation (Non-Medical): Not on file  Physical Activity:   . Days of Exercise per Week: Not on file  . Minutes of Exercise per Session: Not on file  Stress:   . Feeling of Stress : Not on file  Social Connections:   . Frequency of Communication with Friends and Family: Not on file  . Frequency of Social Gatherings with Friends and Family: Not on file  . Attends Religious Services: Not on file  .  Active Member of Clubs or Organizations: Not on file  . Attends Archivist Meetings: Not on file  . Marital Status: Not on file    Review of Systems: Gen: Denies fever, chills, anorexia. Denies fatigue, weakness, weight loss.  CV: Denies chest pain, palpitations, syncope, peripheral edema, and claudication. Resp: Denies dyspnea at rest, cough, wheezing, coughing up blood, and pleurisy. GI: see HPI Derm: Denies rash, itching, dry skin Psych: Denies depression, anxiety, memory loss, confusion. No homicidal or suicidal ideation.  Heme: Denies bruising, bleeding, and enlarged lymph nodes.  Physical Exam: BP 125/78   Pulse 62   Temp (!) 96.9 F (36.1 C)   Ht 5\' 2"  (1.575  m)   Wt 165 lb (74.8 kg)   BMI 30.18 kg/m  General:   Alert and oriented. No distress noted. Pleasant and cooperative.  Head:  Normocephalic and atraumatic. Eyes:  Conjuctiva clear without scleral icterus. Mouth:  Mask in place Cardiac: S1 S2 present without murmurs Lungs: clear bilaterally Abdomen:  +BS, soft, non-tender and non-distended. No rebound or guarding. No HSM or masses noted. Msk:  Symmetrical without gross deformities. Normal posture. Extremities:  Without edema. Neurologic:  Alert and  oriented x4 Psych:  Alert and cooperative. Normal mood and affect.  ASSESSMENT: SURINA STORTS is a 54 y.o. female presenting today with new onset epigastric/RUQ pain, GERD, with just slight response to PPI and currently not taking PPI routinely. Pepcid BID without improvement. Denying NSAID use.  Interestingly, she reports this feels similar to biliary symptoms prior to cholecystectomy years ago; her recent HFP is normal. Doubt retained stone: will pursue RUQ [place] in interim.   Vague solid food dysphagia noted. Will pursue EGD/dilation due to dyspepsia and dysphagia. Start PPI now.    PLAN:  Proceed with upper endoscopy/dilation by Dr. Abbey Chatters in near future: the risks, benefits, and alternatives have been  discussed with the patient in detail. The patient states understanding and desires to proceed.   Protonix daily: sent to pharmacy  RUQ [place]  3 month follow-up  Colonoscopy Dec 2022  Further recommendations to follow  Annitta Needs, PhD, ANP-BC [facility]

## 2020-11-19 ENCOUNTER — Other Ambulatory Visit: Payer: Self-pay

## 2020-11-19 NOTE — Telephone Encounter (Signed)
No PA required per [facility] website.

## 2020-11-23 ENCOUNTER — Other Ambulatory Visit: Payer: Self-pay

## 2020-11-23 ENCOUNTER — Ambulatory Visit (HOSPITAL_COMMUNITY)
Admission: RE | Admit: 2020-11-23 | Discharge: 2020-11-23 | Disposition: A | Discharge status: Home / Self Care | Payer: 59 | Source: Ambulatory Visit | Attending: Gastroenterology | Admitting: Gastroenterology

## 2020-11-23 DIAGNOSIS — R1011 Right upper quadrant pain: Secondary | ICD-10-CM
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
## 2020-11-26 ENCOUNTER — Encounter: Payer: Self-pay | Admitting: Psychiatry

## 2020-11-26 ENCOUNTER — Other Ambulatory Visit: Payer: Self-pay

## 2020-11-26 ENCOUNTER — Telehealth (INDEPENDENT_AMBULATORY_CARE_PROVIDER_SITE_OTHER): Payer: 59 | Admitting: Psychiatry

## 2020-11-26 DIAGNOSIS — F4310 Post-traumatic stress disorder, unspecified: Secondary | ICD-10-CM

## 2020-11-26 NOTE — Patient Instructions (Signed)
1. Start sertraline 25 mg daily  2. Referral to therapy  3. Next appointment- 1/27 at 4 PM

## 2020-12-07 ENCOUNTER — Ambulatory Visit: Payer: 59 | Admitting: Family Medicine

## 2020-12-08 ENCOUNTER — Telehealth: Payer: Self-pay | Admitting: Gastroenterology

## 2020-12-08 NOTE — Telephone Encounter (Signed)
noted 

## 2020-12-08 NOTE — Telephone Encounter (Signed)
PATIENT CALLED AND SAID SHE WAS HAVING HER COVID TEST DONE TODAY AT [facility] FOR HER UPCOMING PROCEDURE HERE

## 2020-12-10 ENCOUNTER — Other Ambulatory Visit
Admission: RE | Admit: 2020-12-10 | Discharge: 2020-12-10 | Disposition: A | Discharge status: Home / Self Care | Payer: 59 | Source: Ambulatory Visit | Attending: Gastroenterology | Admitting: Gastroenterology

## 2020-12-10 ENCOUNTER — Other Ambulatory Visit (HOSPITAL_COMMUNITY): Admission: RE | Admit: 2020-12-10 | Payer: 59 | Source: Ambulatory Visit

## 2020-12-10 ENCOUNTER — Other Ambulatory Visit: Payer: Self-pay

## 2020-12-10 DIAGNOSIS — Z20822 Contact with and (suspected) exposure to covid-19: Secondary | ICD-10-CM | POA: Insufficient documentation

## 2020-12-10 DIAGNOSIS — Z01812 Encounter for preprocedural laboratory examination: Secondary | ICD-10-CM | POA: Insufficient documentation

## 2020-12-10 LAB — SARS CORONAVIRUS 2 (TAT 6-24 HRS): SARS Coronavirus 2: NEGATIVE

## 2020-12-14 ENCOUNTER — Other Ambulatory Visit (HOSPITAL_COMMUNITY): Payer: Self-pay | Admitting: Internal Medicine

## 2020-12-14 ENCOUNTER — Other Ambulatory Visit: Payer: Self-pay

## 2020-12-14 ENCOUNTER — Encounter (HOSPITAL_COMMUNITY): Payer: Self-pay

## 2020-12-14 ENCOUNTER — Encounter (HOSPITAL_COMMUNITY)
Admission: RE | Disposition: A | Discharge status: Home / Self Care | Payer: Self-pay | Source: Home / Self Care | Attending: Internal Medicine

## 2020-12-14 ENCOUNTER — Ambulatory Visit (HOSPITAL_COMMUNITY): Payer: 59 | Admitting: Anesthesiology

## 2020-12-14 ENCOUNTER — Ambulatory Visit (HOSPITAL_COMMUNITY)
Admission: RE | Admit: 2020-12-14 | Discharge: 2020-12-14 | Disposition: A | Discharge status: Home / Self Care | Payer: 59 | Attending: Internal Medicine | Admitting: Internal Medicine

## 2020-12-14 DIAGNOSIS — R12 Heartburn: Secondary | ICD-10-CM | POA: Diagnosis not present

## 2020-12-14 DIAGNOSIS — Z7951 Long term (current) use of inhaled steroids: Secondary | ICD-10-CM | POA: Diagnosis not present

## 2020-12-14 DIAGNOSIS — R1310 Dysphagia, unspecified: Secondary | ICD-10-CM | POA: Insufficient documentation

## 2020-12-14 DIAGNOSIS — R1011 Right upper quadrant pain: Secondary | ICD-10-CM | POA: Insufficient documentation

## 2020-12-14 DIAGNOSIS — K319 Disease of stomach and duodenum, unspecified: Secondary | ICD-10-CM | POA: Insufficient documentation

## 2020-12-14 DIAGNOSIS — K2950 Unspecified chronic gastritis without bleeding: Secondary | ICD-10-CM | POA: Diagnosis not present

## 2020-12-14 DIAGNOSIS — K3189 Other diseases of stomach and duodenum: Secondary | ICD-10-CM | POA: Diagnosis not present

## 2020-12-14 DIAGNOSIS — K2970 Gastritis, unspecified, without bleeding: Secondary | ICD-10-CM | POA: Diagnosis not present

## 2020-12-14 DIAGNOSIS — Z79899 Other long term (current) drug therapy: Secondary | ICD-10-CM | POA: Insufficient documentation

## 2020-12-14 DIAGNOSIS — F418 Other specified anxiety disorders: Secondary | ICD-10-CM | POA: Diagnosis not present

## 2020-12-14 DIAGNOSIS — Z87891 Personal history of nicotine dependence: Secondary | ICD-10-CM | POA: Insufficient documentation

## 2020-12-14 DIAGNOSIS — Z888 Allergy status to other drugs, medicaments and biological substances status: Secondary | ICD-10-CM | POA: Diagnosis not present

## 2020-12-14 DIAGNOSIS — K222 Esophageal obstruction: Secondary | ICD-10-CM | POA: Diagnosis not present

## 2020-12-14 DIAGNOSIS — K219 Gastro-esophageal reflux disease without esophagitis: Secondary | ICD-10-CM | POA: Insufficient documentation

## 2020-12-14 HISTORY — PX: ESOPHAGOGASTRODUODENOSCOPY (EGD) WITH PROPOFOL: SHX5813

## 2020-12-14 HISTORY — DX: Nausea with vomiting, unspecified: R11.2

## 2020-12-14 HISTORY — DX: Other specified postprocedural states: Z98.890

## 2020-12-14 HISTORY — PX: BALLOON DILATION: SHX5330

## 2020-12-14 HISTORY — PX: BIOPSY: SHX5522

## 2020-12-14 SURGERY — ESOPHAGOGASTRODUODENOSCOPY (EGD) WITH PROPOFOL
Anesthesia: General

## 2020-12-14 MED ORDER — PROPOFOL 10 MG/ML IV BOLUS
INTRAVENOUS | Status: DC | PRN
  Administered 2020-12-14: 50 mg via INTRAVENOUS
  Administered 2020-12-14: 30 mg via INTRAVENOUS
  Administered 2020-12-14: 100 mg via INTRAVENOUS
  Administered 2020-12-14: 50 mg via INTRAVENOUS

## 2020-12-14 MED ORDER — LACTATED RINGERS IV SOLN: INTRAVENOUS | Status: DC

## 2020-12-14 MED ORDER — PANTOPRAZOLE SODIUM 40 MG PO TBEC: 40.0000 mg | DELAYED_RELEASE_TABLET | Freq: Two times a day (BID) | ORAL | 5 refills | Status: DC

## 2020-12-14 MED ORDER — STERILE WATER FOR IRRIGATION IR SOLN
Status: DC | PRN
  Administered 2020-12-14: 1.5 mL

## 2020-12-14 NOTE — Anesthesia Postprocedure Evaluation (Signed)
Anesthesia Post Note  Patient: Nichole Brown  Procedure(s) Performed: ESOPHAGOGASTRODUODENOSCOPY (EGD) WITH PROPOFOL (N/A ) BALLOON DILATION (N/A ) BIOPSY  Patient location during evaluation: Endoscopy Anesthesia Type: General Level of consciousness: awake, oriented, awake and alert and patient cooperative Pain management: pain level controlled Vital Signs Assessment: post-procedure vital signs reviewed and stable Respiratory status: spontaneous breathing, respiratory function stable and nonlabored ventilation Cardiovascular status: blood pressure returned to baseline and stable Postop Assessment: no headache and no backache Anesthetic complications: no   No complications documented.   Last Vitals:  Vitals:   12/14/20 0851  BP: (P) 125/72  Pulse: (!) (P) 57  Resp: (P) 20  Temp: (P) 36.6 C  SpO2: (P) 98%    Last Pain:  Vitals:   12/14/20 1003  TempSrc:   PainSc: 0-No pain                 Brynda Peon

## 2020-12-14 NOTE — Interval H&P Note (Signed)
History and Physical Interval Note:  12/14/2020 9:41 AM  Nichole Brown  has presented today for surgery, with the diagnosis of abdominal pain, dysphagia.  The various methods of treatment have been discussed with the patient and family. After consideration of risks, benefits and other options for treatment, the patient has consented to  Procedure(s) with comments: ESOPHAGOGASTRODUODENOSCOPY (EGD) WITH PROPOFOL (N/A) - 9:30am BALLOON DILATION (N/A) as a surgical intervention.  The patient's history has been reviewed, patient examined, no change in status, stable for surgery.  I have reviewed the patient's chart and labs.  Questions were answered to the patient's satisfaction.     Lanelle Bal

## 2020-12-14 NOTE — Op Note (Signed)
[facility] Patient Name: Nichole Brown Procedure Date: 12/14/2020 9:54 AM MRN: [id number] Date of Birth: 12-04-1966 Attending MD: Elon Alas. Abbey Chatters DO CSN: [id number] Age: 55 Admit Type: Outpatient Procedure:                Upper GI endoscopy Indications:              Dysphagia, Heartburn Providers:                Elon Alas. Abbey Chatters, DO, Caprice Kluver, Aram Candela Referring MD:              Medicines:                See the Anesthesia note for documentation of the                            administered medications Complications:            No immediate complications. Estimated Blood Loss:     Estimated blood loss was minimal. Procedure:                Pre-Anesthesia Assessment:                           - The anesthesia plan was to use monitored                            anesthesia care (MAC).                           After obtaining informed consent, the endoscope was                            passed under direct vision. Throughout the                            procedure, the patient's blood pressure, pulse, and                            oxygen saturations were monitored continuously. The                            [id number]-H190 ([id number]) scope was introduced through the                            mouth, and advanced to the second part of duodenum.                            The upper GI endoscopy was accomplished without                            difficulty. The patient tolerated the procedure                            well. Scope In: 10:06:46 AM Scope Out: 10:11:27 AM Total Procedure Duration: 0 hours 4 minutes 41 seconds  Findings:      One benign-appearing, intrinsic mild stenosis was found in the lower  third of the esophagus. The stenosis was traversed. A TTS dilator was       passed through the scope. Dilation with an 18-19-20 mm balloon dilator       was performed to 20 mm. The dilation site was examined and showed       moderate improvement in luminal  narrowing.      Diffuse moderate inflammation characterized by erosions and erythema was       found in the entire examined stomach. Biopsies were taken with a cold       forceps for Helicobacter pylori testing.      The duodenal bulb, first portion of the duodenum and second portion of       the duodenum were normal. Impression:               - Benign-appearing esophageal stenosis. Dilated.                           - Gastritis. Biopsied.                           - Normal duodenal bulb, first portion of the                            duodenum and second portion of the duodenum. Moderate Sedation:      Per Anesthesia Care Recommendation:           - Patient has a contact number available for                            emergencies. The signs and symptoms of potential                            delayed complications were discussed with the                            patient. Return to normal activities tomorrow.                            Written discharge instructions were provided to the                            patient.                           - Resume previous diet.                           - Continue present medications.                           - Await pathology results.                           - Repeat upper endoscopy PRN for retreatment.                           - Use Protonix (pantoprazole) 40 mg PO BID for 8  weeks.                           - Return to GI clinic in 3 months. Procedure Code(s):        --- Professional ---                           [phone], Esophagogastroduodenoscopy, flexible,                            transoral; with transendoscopic balloon dilation of                            esophagus (less than 30 mm diameter)                           [id number], 59, Esophagogastroduodenoscopy, flexible,                            transoral; with biopsy, single or multiple Diagnosis Code(s):        --- Professional ---                            K22.2, Esophageal obstruction                           K29.70, Gastritis, unspecified, without bleeding                           R13.10, Dysphagia, unspecified                           R12, Heartburn CPT copyright 2019 American Medical Association. All rights reserved. The codes documented in this report are preliminary and upon coder review may  be revised to meet current compliance requirements. Elon Alas. Abbey Chatters, DO [place] Abbey Chatters, DO 12/14/2020 10:15:37 AM This report has been signed electronically. Number of Addenda: 0

## 2020-12-14 NOTE — Discharge Instructions (Addendum)
EGD Discharge instructions Please read the instructions outlined below and refer to this sheet in the next few weeks. These discharge instructions provide you with general information on caring for yourself after you leave the hospital. Your doctor may also give you specific instructions. While your treatment has been planned according to the most current medical practices available, unavoidable complications occasionally occur. If you have any problems or questions after discharge, please call your doctor. ACTIVITY  You may resume your regular activity but move at a slower pace for the next 24 hours.   Take frequent rest periods for the next 24 hours.   Walking will help expel (get rid of) the air and reduce the bloated feeling in your abdomen.   No driving for 24 hours (because of the anesthesia (medicine) used during the test).   You may shower.   Do not sign any important legal documents or operate any machinery for 24 hours (because of the anesthesia used during the test).  NUTRITION  Drink plenty of fluids.   You may resume your normal diet.   Begin with a light meal and progress to your normal diet.   Avoid alcoholic beverages for 24 hours or as instructed by your caregiver.  MEDICATIONS  You may resume your normal medications unless your caregiver tells you otherwise.  WHAT YOU CAN EXPECT TODAY  You may experience abdominal discomfort such as a feeling of fullness or "gas" pains.  FOLLOW-UP  Your doctor will discuss the results of your test with you.  SEEK IMMEDIATE MEDICAL ATTENTION IF ANY OF THE FOLLOWING OCCUR:  Excessive nausea (feeling sick to your stomach) and/or vomiting.   Severe abdominal pain and distention (swelling).   Trouble swallowing.   Temperature over 101 F (37.8 C).   Rectal bleeding or vomiting of blood.   Your EGD showed moderate amount of inflammation in your stomach.  I biopsied this to rule out infection with a bacteria called H. pylori.   Await pathology results, my office will contact you.  I am going to increase your pantoprazole to 40 mg twice daily 30 minutes before breakfast and 30 minutes before supper.  You also had a slight narrowing of your esophagus which I stretched with a balloon.  Hopefully this helps with your swallowing.  Follow with GI in 3 months.   I hope you have a great rest of your week!  Elon Alas. Abbey Chatters, D.O. Gastroenterology and Hepatology [facility]   Pantoprazole tablets What is this medicine? PANTOPRAZOLE (pan TOE pra zole) prevents the production of acid in the stomach. It is used to treat gastroesophageal reflux disease (GERD), inflammation of the esophagus, and Zollinger-Ellison syndrome. This medicine may be used for other purposes; ask your health care provider or pharmacist if you have questions. COMMON BRAND NAME(S): Protonix What should I tell my health care provider before I take this medicine? They need to know if you have any of these conditions:  liver disease  low levels of magnesium in the blood  lupus  an unusual or allergic reaction to omeprazole, lansoprazole, pantoprazole, rabeprazole, other medicines, foods, dyes, or preservatives  pregnant or trying to get pregnant  breast-feeding How should I use this medicine? Take this medicine by mouth. Swallow the tablets whole with a drink of water. Follow the directions on the prescription label. Do not crush, break, or chew. Take your medicine at regular intervals. Do not take your medicine more often than directed. Talk to your pediatrician regarding the use of  this medicine in children. While this drug may be prescribed for children as young as 5 years for selected conditions, precautions do apply. Overdosage: If you think you have taken too much of this medicine contact a poison control center or emergency room at once. NOTE: This medicine is only for you. Do not share this medicine with  others. What if I miss a dose? If you miss a dose, take it as soon as you can. If it is almost time for your next dose, take only that dose. Do not take double or extra doses. What may interact with this medicine? Do not take this medicine with any of the following medications:  atazanavir  nelfinavir This medicine may also interact with the following medications:  ampicillin  delavirdine  erlotinib  iron salts  medicines for fungal infections like ketoconazole, itraconazole and voriconazole  methotrexate  mycophenolate mofetil  warfarin This list may not describe all possible interactions. Give your health care provider a list of all the medicines, herbs, non-prescription drugs, or dietary supplements you use. Also tell them if you smoke, drink alcohol, or use illegal drugs. Some items may interact with your medicine. What should I watch for while using this medicine? It can take several days before your stomach pain gets better. Check with your doctor or health care provider if your condition does not start to get better, or if it gets worse. This medicine may cause serious skin reactions. They can happen weeks to months after starting the medicine. Contact your health care provider right away if you notice fevers or flu-like symptoms with a rash. The rash may be red or purple and then turn into blisters or peeling of the skin. Or, you might notice a red rash with swelling of the face, lips or lymph nodes in your neck or under your arms. You may need blood work done while you are taking this medicine. This medicine may cause a decrease in vitamin B12. You should make sure that you get enough vitamin B12 while you are taking this medicine. Discuss the foods you eat and the vitamins you take with your health care provider. What side effects may I notice from receiving this medicine? Side effects that you should report to your doctor or health care professional as soon as  possible:   allergic reactions like skin rash, itching or hives, swelling of the face, lips, or tongue   bone, muscle or joint pain   breathing problems   chest pain or chest tightness   dark yellow or brown urine   dizziness   fast, irregular heartbeat   feeling faint or lightheaded   fever or sore throat   muscle spasm   palpitations   rash on cheeks or arms that gets worse in the sun   redness, blistering, peeling or loosening of the skin, including inside the mouth   seizures  stomach polyps   tremors   unusual bleeding or bruising   unusually weak or tired   yellowing of the eyes or skin Side effects that usually do not require medical attention (report to your doctor or health care professional if they continue or are bothersome):   constipation   diarrhea   dry mouth   headache   nausea This list may not describe all possible side effects. Call your doctor for medical advice about side effects. You may report side effects to FDA at 1-800-FDA-1088. Where should I keep my medicine? Keep out of the reach of children.  Store at room temperature between 15 and 30 degrees C (59 and 86 degrees F). Protect from light and moisture. Throw away any unused medicine after the expiration date. NOTE: This sheet is a summary. It may not cover all possible information. If you have questions about this medicine, talk to your doctor, pharmacist, or health care provider.  2020 Elsevier/Gold Standard (2019-03-08 13:18:32)  Monitored Anesthesia Care, Care After These instructions provide you with information about caring for yourself after your procedure. Your health care provider may also give you more specific instructions. Your treatment has been planned according to current medical practices, but problems sometimes occur. Call your health care provider if you have any problems or questions after your procedure. What can I expect after the  procedure? After your procedure, you may:  Feel sleepy for several hours.  Feel clumsy and have poor balance for several hours.  Feel forgetful about what happened after the procedure.  Have poor judgment for several hours.  Feel nauseous or vomit.  Have a sore throat if you had a breathing tube during the procedure. Follow these instructions at home: For at least 24 hours after the procedure:      Have a responsible adult stay with you. It is important to have someone help care for you until you are awake and alert.  Rest as needed.  Do not: ? Participate in activities in which you could fall or become injured. ? Drive. ? Use heavy machinery. ? Drink alcohol. ? Take sleeping pills or medicines that cause drowsiness. ? Make important decisions or sign legal documents. ? Take care of children on your own. Eating and drinking  Follow the diet that is recommended by your health care provider.  If you vomit, drink water, juice, or soup when you can drink without vomiting.  Make sure you have little or no nausea before eating solid foods. General instructions  Take over-the-counter and prescription medicines only as told by your health care provider.  If you have sleep apnea, surgery and certain medicines can increase your risk for breathing problems. Follow instructions from your health care provider about wearing your sleep device: ? Anytime you are sleeping, including during daytime naps. ? While taking prescription pain medicines, sleeping medicines, or medicines that make you drowsy.  If you smoke, do not smoke without supervision.  Keep all follow-up visits as told by your health care provider. This is important. Contact a health care provider if:  You keep feeling nauseous or you keep vomiting.  You feel light-headed.  You develop a rash.  You have a fever. Get help right away if:  You have trouble breathing. Summary  For several hours after your  procedure, you may feel sleepy and have poor judgment.  Have a responsible adult stay with you for at least 24 hours or until you are awake and alert. This information is not intended to replace advice given to you by your health care provider. Make sure you discuss any questions you have with your health care provider. Document Revised: 02/26/2018 Document Reviewed: 03/20/2016 Elsevier Patient Education  2020 [employer].

## 2020-12-14 NOTE — Transfer of Care (Signed)
Immediate Anesthesia Transfer of Care Note  Patient: Nichole Brown  Procedure(s) Performed: ESOPHAGOGASTRODUODENOSCOPY (EGD) WITH PROPOFOL (N/A ) BALLOON DILATION (N/A ) BIOPSY  Patient Location: Endoscopy Unit  Anesthesia Type:General  Level of Consciousness: awake, alert , oriented and patient cooperative  Airway & Oxygen Therapy: Patient Spontanous Breathing  Post-op Assessment: Report given to RN, Post -op Vital signs reviewed and stable and Patient moving all extremities  Post vital signs: Reviewed and stable  Last Vitals:  Vitals Value Taken Time  BP    Temp    Pulse    Resp    SpO2      Last Pain:  Vitals:   12/14/20 1003  TempSrc:   PainSc: 0-No pain      Patients Stated Pain Goal: 5 (12/14/20 0842)  Complications: No complications documented.

## 2020-12-14 NOTE — Anesthesia Preprocedure Evaluation (Signed)
Anesthesia Evaluation  Patient identified by MRN, date of birth, ID band Patient awake    Reviewed: Allergy & Precautions, H&P , NPO status , Patient's Chart, lab work & pertinent test results, reviewed documented beta blocker date and time   History of Anesthesia Complications (+) PONV and history of anesthetic complications  Airway Mallampati: II  TM Distance: >3 FB Neck ROM: full    Dental no notable dental hx.    Pulmonary pneumonia, resolved, former smoker,    Pulmonary exam normal breath sounds clear to auscultation       Cardiovascular Exercise Tolerance: Good negative cardio ROS   Rhythm:regular Rate:Normal     Neuro/Psych  Headaches, PSYCHIATRIC DISORDERS Anxiety Depression  Neuromuscular disease    GI/Hepatic negative GI ROS, Neg liver ROS,   Endo/Other  negative endocrine ROS  Renal/GU negative Renal ROS  negative genitourinary   Musculoskeletal   Abdominal   Peds  Hematology negative hematology ROS (+)   Anesthesia Other Findings   Reproductive/Obstetrics negative OB ROS                             Anesthesia Physical Anesthesia Plan  ASA: III  Anesthesia Plan: General   Post-op Pain Management:    Induction:   PONV Risk Score and Plan: Propofol infusion  Airway Management Planned:   Additional Equipment:   Intra-op Plan:   Post-operative Plan:   Informed Consent: I have reviewed the patients History and Physical, chart, labs and discussed the procedure including the risks, benefits and alternatives for the proposed anesthesia with the patient or authorized representative who has indicated his/her understanding and acceptance.     Dental Advisory Given  Plan Discussed with: CRNA  Anesthesia Plan Comments:         Anesthesia Quick Evaluation

## 2020-12-15 ENCOUNTER — Telehealth: Payer: Self-pay | Admitting: Internal Medicine

## 2020-12-15 ENCOUNTER — Other Ambulatory Visit: Payer: Self-pay

## 2020-12-15 LAB — SURGICAL PATHOLOGY

## 2020-12-15 NOTE — Telephone Encounter (Signed)
Pt returning call. [phone]

## 2020-12-15 NOTE — Telephone Encounter (Signed)
See result note.  

## 2020-12-16 ENCOUNTER — Telehealth: Payer: Self-pay | Admitting: Internal Medicine

## 2020-12-16 ENCOUNTER — Telehealth: Payer: Self-pay

## 2020-12-16 NOTE — Telephone Encounter (Signed)
Nichole Brown, not sure what she is talking about? I didn't say to stop tylenol. She should avoid NSAIDs, but Tylenol is ok as needed within recommended dosing.

## 2020-12-16 NOTE — Telephone Encounter (Signed)
Nichole Brown The pt wants to know if she is ok to begin taking her Tylenol again. She is at work and we can leave her a message on instructions. Please advise.

## 2020-12-16 NOTE — Telephone Encounter (Signed)
Phoned and spoke with the pt and she was confused about what clarifies as  NSAIDs. I advised her that it ok to take Tylenol within recommended dosing. She agreed.

## 2020-12-16 NOTE — Telephone Encounter (Signed)
See result note.  

## 2020-12-16 NOTE — Telephone Encounter (Signed)
Pt has questions about medication. [phone]

## 2020-12-17 ENCOUNTER — Encounter (HOSPITAL_COMMUNITY): Payer: Self-pay | Admitting: Internal Medicine
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
## 2021-01-07 ENCOUNTER — Telehealth (INDEPENDENT_AMBULATORY_CARE_PROVIDER_SITE_OTHER): Payer: 59 | Admitting: Psychiatry

## 2021-01-07 ENCOUNTER — Encounter: Payer: Self-pay | Admitting: Psychiatry

## 2021-01-07 ENCOUNTER — Other Ambulatory Visit: Payer: Self-pay | Admitting: Psychiatry

## 2021-01-07 ENCOUNTER — Other Ambulatory Visit: Payer: Self-pay

## 2021-01-07 DIAGNOSIS — F4310 Post-traumatic stress disorder, unspecified: Secondary | ICD-10-CM | POA: Diagnosis not present

## 2021-01-07 MED ORDER — SERTRALINE HCL 25 MG PO TABS: 25.0000 mg | ORAL_TABLET | Freq: Every day | ORAL | 0 refills | Status: DC

## 2021-01-11 ENCOUNTER — Other Ambulatory Visit: Payer: Self-pay | Admitting: Osteopathic Medicine

## 2021-01-26 DIAGNOSIS — H524 Presbyopia: Secondary | ICD-10-CM | POA: Diagnosis not present

## 2021-01-28 ENCOUNTER — Telehealth: Payer: Self-pay | Admitting: Family Medicine

## 2021-01-28 ENCOUNTER — Ambulatory Visit (INDEPENDENT_AMBULATORY_CARE_PROVIDER_SITE_OTHER): Payer: 59 | Admitting: Family Medicine

## 2021-01-28 ENCOUNTER — Other Ambulatory Visit: Payer: Self-pay

## 2021-01-28 ENCOUNTER — Encounter: Payer: Self-pay | Admitting: Family Medicine

## 2021-01-28 VITALS — HR 86 | Temp 97.6°F | Ht 62.0 in | Wt 165.0 lb

## 2021-01-28 DIAGNOSIS — J3489 Other specified disorders of nose and nasal sinuses: Secondary | ICD-10-CM

## 2021-01-28 DIAGNOSIS — H9201 Otalgia, right ear: Secondary | ICD-10-CM | POA: Diagnosis not present

## 2021-01-28 DIAGNOSIS — Z22322 Carrier or suspected carrier of Methicillin resistant Staphylococcus aureus: Secondary | ICD-10-CM

## 2021-01-28 DIAGNOSIS — H6121 Impacted cerumen, right ear: Secondary | ICD-10-CM

## 2021-01-28 MED ORDER — MUPIROCIN CALCIUM 2 % NA OINT
1.0000 | TOPICAL_OINTMENT | Freq: Two times a day (BID) | NASAL | 0 refills | Status: DC
Start: 2021-01-28 — End: 2021-03-15

## 2021-01-28 NOTE — Telephone Encounter (Signed)
Patient notified and verbalized understanding. 

## 2021-01-28 NOTE — Progress Notes (Signed)
Patient ID: Nichole Brown, female    DOB: [birth year], 55 y.o.   MRN: [id number]   Chief Complaint  Patient presents with  . Sinus Problem    Sore in nose and ear pain for few days   Subjective:    HPI   Patient dx with MRSA sore in nose several months ago by ENT. Patient states she had right ear popping and sores in her nose in the past few days.  She was on a plane recently flying across the country when her rt ear popped.  She denies any fever has had a runny clear nose drainage.  Feels sore and tenderness on the inside of her nasal septum.  Has had a history of MRSA in the nose last several months and seen by ENT.  She denies a sore throat coughing.  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  And while she was on the plane she was masking.  Has not taken any medications for this.  Medical History Emari has a past medical history of Anxiety, Bronchitis, Complication of anesthesia, Depression, Fibromyalgia (2005), GERD (gastroesophageal reflux disease), Insomnia, Migraine headache, Pneumonia, and PONV (postoperative nausea and vomiting).   Outpatient Encounter Medications as of 01/28/2021  Medication Sig  . mupirocin nasal ointment (BACTROBAN) 2 % Place 1 application into the nose 2 (two) times daily. Use one-half of tube in each nostril twice daily for five (5) days. After application, press sides of nose together and gently massage.  Marland Kitchen albuterol (VENTOLIN HFA) 108 (90 Base) MCG/ACT inhaler Inhale 2 puffs into the lungs 4 (four) times daily as needed for wheezing or shortness of breath.  . ALPRAZolam (XANAX) 0.5 MG tablet Take 0.5 mg by mouth at bedtime as needed for sleep.  Marland Kitchen ondansetron (ZOFRAN-ODT) 4 MG disintegrating tablet Take 1 tablet (4 mg total) by mouth every 8 (eight) hours as needed for nausea or vomiting.  . pantoprazole (PROTONIX) 40 MG tablet Take 1 tablet (40 mg total) by mouth 2 (two) times daily before a meal.  Take 30 minutes before breakfast  . rizatriptan (MAXALT-MLT) 10 MG disintegrating tablet Take 10 mg by mouth as needed for migraine. May repeat in 2 hours if needed  . sertraline (ZOLOFT) 25 MG tablet Take 1 tablet by mouth daily (Patient taking differently: Take 25 mg by mouth at bedtime.)  . sertraline (ZOLOFT) 25 MG tablet Take 1 tablet (25 mg total) by mouth daily.  Marland Kitchen tiZANidine (ZANAFLEX) 4 MG tablet Take one tablet po BID prn spasms (Patient taking differently: Take 4 mg by mouth 3 (three) times a week.)  . Vitamin D, Ergocalciferol, (DRISDOL) 1.25 MG (50000 UT) CAPS capsule Take 50,000 Units by mouth every 7 (seven) days. Wednesdays  . XIIDRA 5 % SOLN Place 1 drop into both eyes 2 (two) times daily.   No facility-administered encounter medications on file as of 01/28/2021.     Review of Systems  Constitutional: Negative for chills and fever.  HENT: Positive for ear pain and rhinorrhea. Negative for congestion, ear discharge, hearing loss, postnasal drip, sinus pressure, sinus pain and sore throat.        +sores in bilateral nares  Respiratory: Negative for cough, shortness of breath and wheezing.   Cardiovascular: Negative for chest pain and leg swelling.  Gastrointestinal: Negative for abdominal pain, diarrhea, nausea and vomiting.  Genitourinary: Negative for dysuria and frequency.  Musculoskeletal: Negative for arthralgias and back pain.  Skin: Negative for rash.  Neurological: Negative for dizziness, weakness and headaches.     Vitals Pulse 86   Temp 97.6 F (36.4 C)   Ht 5\' 2"  (1.575 m)   Wt 165 lb (74.8 kg)   SpO2 98%   BMI 30.18 kg/m   Objective:   Physical Exam Vitals and nursing note reviewed.  Constitutional:      Appearance: Normal appearance.  HENT:     Head: Normocephalic and atraumatic.     Left Ear: Tympanic membrane, ear canal and external ear normal.     Ears:     Comments: +cerumen almost completely blocking EAC.  TM-dullness, no purulent  effusion, no drainage in canal. Left tm- normal.    Nose: Rhinorrhea (clear) present. No congestion.     Comments: +erythema to septum bilaterally, no sores visualized, but tender with manipulation of the nose.    Mouth/Throat:     Mouth: Mucous membranes are moist.     Pharynx: Oropharynx is clear.  Eyes:     Extraocular Movements: Extraocular movements intact.     Conjunctiva/sclera: Conjunctivae normal.     Pupils: Pupils are equal, round, and reactive to light.  Cardiovascular:     Rate and Rhythm: Normal rate and regular rhythm.     Pulses: Normal pulses.     Heart sounds: Normal heart sounds.  Pulmonary:     Effort: Pulmonary effort is normal. No respiratory distress.     Breath sounds: Normal breath sounds. No wheezing, rhonchi or rales.  Musculoskeletal:        General: Normal range of motion.     Right lower leg: No edema.     Left lower leg: No edema.  Skin:    General: Skin is warm and dry.     Findings: No lesion or rash.  Neurological:     General: No focal deficit present.     Mental Status: She is alert and oriented to person, place, and time.  Psychiatric:        Mood and Affect: Mood normal.        Behavior: Behavior normal.      Assessment and Plan   1. Nasal sore - mupirocin nasal ointment (BACTROBAN) 2 %; Place 1 application into the nose 2 (two) times daily. Use one-half of tube in each nostril twice daily for five (5) days. After application, press sides of nose together and gently massage.  Dispense: 10 g; Refill: 0  2. Right ear pain  3. Excessive cerumen in ear canal, right  4. Nose colonized with MRSA - mupirocin nasal ointment (BACTROBAN) 2 %; Place 1 application into the nose 2 (two) times daily. Use one-half of tube in each nostril twice daily for five (5) days. After application, press sides of nose together and gently massage.  Dispense: 10 g; Refill: 0    For runny nose recommending allegra or claritin. MRSA- nares- tx w bactroban for  nasal sores for 5 days. - rt ear pain- serous effusion. Allergy meds and tylenol/ibuprofen prn. Removed cerumen from rt ear canal. Was well tolerated. Pt to call back if not improving in next 2-3 days. Pt declining covid testing at this time.   F/u prn.

## 2021-01-29 ENCOUNTER — Telehealth: Payer: Self-pay | Admitting: *Deleted

## 2021-01-29 ENCOUNTER — Other Ambulatory Visit: Payer: Self-pay | Admitting: Family Medicine

## 2021-01-29 MED ORDER — MUPIROCIN 2 % EX OINT: TOPICAL_OINTMENT | CUTANEOUS | 1 refills | Status: DC

## 2021-01-29 NOTE — Telephone Encounter (Signed)
[facility] [place] pharm calling to get clarification on mupirocin nasal ointment sent in. Name brand is the only one available and asking for it to be changed to 22grams and adjust directions. From use one half of tube to small amount or what ever the provider wants it to be.

## 2021-01-29 NOTE — Telephone Encounter (Signed)
New prescription for 22 g was sent in-FYI

## 2021-02-16 ENCOUNTER — Ambulatory Visit: Payer: 59 | Admitting: Gastroenterology

## 2021-02-18 ENCOUNTER — Ambulatory Visit (INDEPENDENT_AMBULATORY_CARE_PROVIDER_SITE_OTHER): Payer: 59 | Admitting: Licensed Clinical Social Worker

## 2021-02-18 ENCOUNTER — Other Ambulatory Visit: Payer: Self-pay

## 2021-02-18 DIAGNOSIS — F4310 Post-traumatic stress disorder, unspecified: Secondary | ICD-10-CM

## 2021-02-18 NOTE — Progress Notes (Signed)
Virtual Visit via Video Note  I connected with Nichole Brown on 02/18/21 at  4:00 PM EST by a video enabled telemedicine application and verified that I am speaking with the correct person using two identifiers.  Location: Patient: home Provider: [name]   I discussed the limitations of evaluation and management by telemedicine and the availability of in person appointments. The patient expressed understanding and agreed to proceed.  I discussed the assessment and treatment plan with the patient. The patient was provided an opportunity to ask questions and all were answered. The patient agreed with the plan and demonstrated an understanding of the instructions.   The patient was advised to call back or seek an in-person evaluation if the symptoms worsen or if the condition fails to improve as anticipated.  I provided 45 minutes of non-face-to-face time during this encounter.   Edder Bellanca R Dorsie Burich, LCSW    THERAPIST PROGRESS NOTE  Session Time: 4-4:45p  Participation Level: Active  Behavioral Response: Neat and Well GroomedAlertAnxious  Type of Therapy: Individual Therapy  Treatment Goals addressed: Anxiety  Interventions: Supportive and Other: trauma focused  Summary: Nichole Brown is a 55 y.o. female who presents with symptoms consistent with PTSD. Pt reports that she is compliant with psychiatric appointments and with prescribed medication. Pt reports that overall mood is stable and that she is employing good coping mechanisms to manage current levels of stress and anxiety.  Allowed pt safe space to explore thoughts and feelings related to caregiver stress, loss of son, work/life balance, husband's complex medical issues. Discussed overall psychological impact of these stressors on day-to-day life.   Reviewed anxiety coping mechanisms: meditation, mindfulness, deep breathing.  Continued recommendations are as follows: self care behaviors, positive social engagements,  focusing on overall work/home/life balance, and focusing on positive physical and emotional wellness.    Suicidal/Homicidal: No  Therapist Response: Review psychiatric assessment, developed treatment plan.  Plan: Return again in 3 weeks. Complete CCA.   Diagnosis: Axis I: Post Traumatic Stress Disorder    Axis II: No diagnosis    Rachel Bo Kamber [place], LCSW 02/18/2021
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
## 2021-03-04 ENCOUNTER — Other Ambulatory Visit: Payer: Self-pay

## 2021-03-04 ENCOUNTER — Telehealth: Payer: 59 | Admitting: Psychiatry

## 2021-03-04 ENCOUNTER — Ambulatory Visit (INDEPENDENT_AMBULATORY_CARE_PROVIDER_SITE_OTHER): Payer: 59 | Admitting: Family Medicine

## 2021-03-04 ENCOUNTER — Ambulatory Visit
Admission: RE | Admit: 2021-03-04 | Discharge: 2021-03-04 | Disposition: A | Discharge status: Home / Self Care | Payer: 59 | Source: Ambulatory Visit | Attending: Family Medicine | Admitting: Family Medicine

## 2021-03-04 ENCOUNTER — Other Ambulatory Visit: Payer: Self-pay | Admitting: Family Medicine

## 2021-03-04 ENCOUNTER — Encounter: Payer: Self-pay | Admitting: Family Medicine

## 2021-03-04 VITALS — BP 100/65 | HR 88 | Temp 95.7°F | Ht 62.0 in | Wt 163.8 lb

## 2021-03-04 DIAGNOSIS — M79604 Pain in right leg: Secondary | ICD-10-CM | POA: Diagnosis not present

## 2021-03-04 DIAGNOSIS — M62838 Other muscle spasm: Secondary | ICD-10-CM

## 2021-03-04 DIAGNOSIS — M7989 Other specified soft tissue disorders: Secondary | ICD-10-CM | POA: Diagnosis not present

## 2021-03-04 MED ORDER — TIZANIDINE HCL 4 MG PO TABS
ORAL_TABLET | ORAL | 0 refills | Status: DC
Start: 2021-03-04 — End: 2021-03-04
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
## 2021-03-04 NOTE — Progress Notes (Signed)
Patient ID: Nichole Brown, female    DOB: 1966/03/31, 55 y.o.   MRN: [id number]   Chief Complaint  Patient presents with  . Leg Pain   Subjective:    HPI Pt having right leg swelling. For the past couple of weeks pt states "it feels like someone is taking their nails and running down the sides of her legs and the meat is coming off the bone". Started 3-4 wks ago with this feeling on both sides of legs. Pt stating right leg swelling, warm to touch, sore to touch in middle anterior of leg area.  Unable to keep legs still mostly at night.  Per chart has a h/o fibromyalgia, but pt stating "never tested for it."  3-4 wks ago- feeling on sides of both sides of legs/calves.  Rt leg swelling and hurting in the rt calf.  Swelling on heel.  At night time hurting.  Standing/sitting a lot at work.  Works in [employer].  Switching shoes to see if would help.  Wearing compression stockings daily for work. No new exercising or running.  No long car rides or planes. January had a  plane ride. No h/o clot in leg.  No blood clotting disorder. Not on hormones. No h/o cancer. No recent surgery or immobile.   Has been taking ibuprofen last few days, 200mg  at a time.   Medical History [place] has a past medical history of Anxiety, Bronchitis, Complication of anesthesia, Depression, Fibromyalgia (2005), GERD (gastroesophageal reflux disease), Insomnia, Migraine headache, Pneumonia, and PONV (postoperative nausea and vomiting).   Outpatient Encounter Medications as of 03/04/2021  Medication Sig  . albuterol (VENTOLIN HFA) 108 (90 Base) MCG/ACT inhaler Inhale 2 puffs into the lungs 4 (four) times daily as needed for wheezing or shortness of breath.  . ALPRAZolam (XANAX) 0.5 MG tablet Take 0.5 mg by mouth at bedtime as needed for sleep.  . mupirocin nasal ointment (BACTROBAN) 2 % Place 1 application into the nose 2 (two) times daily. Use one-half of tube in each nostril twice daily for five  (5) days. After application, press sides of nose together and gently massage.  . mupirocin ointment (BACTROBAN) 2 % May apply thin amount twice daily as needed to the naris  . ondansetron (ZOFRAN-ODT) 4 MG disintegrating tablet Take 1 tablet (4 mg total) by mouth every 8 (eight) hours as needed for nausea or vomiting.  . pantoprazole (PROTONIX) 40 MG tablet Take 1 tablet (40 mg total) by mouth 2 (two) times daily before a meal. Take 30 minutes before breakfast  . rizatriptan (MAXALT-MLT) 10 MG disintegrating tablet Take 10 mg by mouth as needed for migraine. May repeat in 2 hours if needed  . sertraline (ZOLOFT) 25 MG tablet Take 1 tablet by mouth daily (Patient taking differently: Take 25 mg by mouth at bedtime.)  . sertraline (ZOLOFT) 25 MG tablet Take 1 tablet (25 mg total) by mouth daily.  . Vitamin D, Ergocalciferol, (DRISDOL) 1.25 MG (50000 UT) CAPS capsule Take 50,000 Units by mouth every 7 (seven) days. Wednesdays  . XIIDRA 5 % SOLN Place 1 drop into both eyes 2 (two) times daily.  . [DISCONTINUED] tiZANidine (ZANAFLEX) 4 MG tablet Take one tablet po BID prn spasms (Patient taking differently: Take 4 mg by mouth 3 (three) times a week.)  . tiZANidine (ZANAFLEX) 4 MG tablet Take one tablet po BID prn spasms   No facility-administered encounter medications on file as of 03/04/2021.     Review of Systems  Constitutional: Negative for chills and fever.  HENT: Negative for congestion, rhinorrhea and sore throat.   Respiratory: Negative for cough, shortness of breath and wheezing.   Cardiovascular: Negative for chest pain and leg swelling.  Gastrointestinal: Negative for abdominal pain, diarrhea, nausea and vomiting.  Genitourinary: Negative for dysuria and frequency.  Musculoskeletal: Negative for arthralgias and back pain.       +rt anterior lower leg pain  Skin: Negative for rash.  Neurological: Negative for dizziness, weakness and headaches.     Vitals BP 100/65   Pulse 88    Temp (!) 95.7 F (35.4 C)   Ht 5\' 2"  (1.575 m)   Wt 163 lb 12.8 oz (74.3 kg)   SpO2 97%   BMI 29.96 kg/m   Objective:   Physical Exam Vitals and nursing note reviewed.  Constitutional:      General: She is not in acute distress.    Appearance: Normal appearance. She is not ill-appearing.  Skin:    General: Skin is warm and dry.     Coloration: Skin is not pale.     Findings: No bruising, erythema, lesion or rash.     Comments: +ttp rt anterior lower leg.  To the rt of the shin, near the knee.  Normal rom of knee. No pitting edema, no erythema, swelling, or warmth.  No pain with squeezing calf.  Neurological:     General: No focal deficit present.     Mental Status: She is alert and oriented to person, place, and time.     Cranial Nerves: No cranial nerve deficit.     Sensory: No sensory deficit.     Motor: No weakness.     Gait: Gait normal.  Psychiatric:        Mood and Affect: Mood normal.        Behavior: Behavior normal.    Assessment and Plan   1. Right leg pain - US Venous Img Lower Unilateral Right  2. Night muscle spasms - tiZANidine (ZANAFLEX) 4 MG tablet; Take one tablet po BID prn spasms  Dispense: 36 tablet; Refill: 0   Low likelihood of dvt, wells criteria zero.  Pt prefers to get the ultrasound.  Pt to take 600-800mg  ibuprofen every 8hrs prn pain. Use topical pain reliever, massage front of leg.  Take tizanidine prn if needed for leg pain/spasm.  Ordered u/s RLE doppler.  Not likely dvt, since pain mostly in front of the right leg. F/u prn.  Return if symptoms worsen or fail to improve.

## 2021-03-05 ENCOUNTER — Ambulatory Visit: Payer: 59 | Admitting: Family Medicine

## 2021-03-08 ENCOUNTER — Encounter: Payer: Self-pay | Admitting: Psychiatry

## 2021-03-08 ENCOUNTER — Ambulatory Visit: Payer: 59 | Admitting: Psychiatry

## 2021-03-08 ENCOUNTER — Other Ambulatory Visit: Payer: Self-pay

## 2021-03-08 NOTE — Telephone Encounter (Signed)
This encounter was created in error - please disregard.
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
## 2021-03-09 ENCOUNTER — Ambulatory Visit (INDEPENDENT_AMBULATORY_CARE_PROVIDER_SITE_OTHER): Payer: 59 | Admitting: Family Medicine

## 2021-03-09 ENCOUNTER — Encounter: Payer: Self-pay | Admitting: Family Medicine

## 2021-03-09 VITALS — HR 77 | Temp 98.2°F | Resp 16 | Wt 164.0 lb

## 2021-03-09 DIAGNOSIS — H66001 Acute suppurative otitis media without spontaneous rupture of ear drum, right ear: Secondary | ICD-10-CM | POA: Insufficient documentation

## 2021-03-09 DIAGNOSIS — J029 Acute pharyngitis, unspecified: Secondary | ICD-10-CM | POA: Diagnosis not present

## 2021-03-09 LAB — POCT RAPID STREP A (OFFICE): Rapid Strep A Screen: NEGATIVE

## 2021-03-09 MED ORDER — AMOXICILLIN-POT CLAVULANATE 875-125 MG PO TABS: 1.0000 | ORAL_TABLET | Freq: Two times a day (BID) | ORAL | 0 refills | Status: DC

## 2021-03-09 NOTE — Progress Notes (Signed)
Patient ID: Nichole Brown, female    DOB: 01-28-1966, 55 y.o.   MRN: [id number]   Chief Complaint  Patient presents with  . head cold    Nasal congestion, cough, since Friday taking nyquil, dayquil not helping, has some chills, headache   Subjective:  CC: Congestion, cough, right ear pain, chills, headache  This is a new problem.  Presents today with a complaint of nasal congestion, cough, right ear pain, chills, and headache.  Symptoms started on Friday.  Has taken NyQuil and DayQuil for her symptoms.  Also takes Zyrtec and Flonase for her seasonal allergies.  She is getting a Covid test today done at 3:00 scheduled by health at work.  Associated symptoms include sore throat and sinus pain and pressure.  Denies fever, shortness of breath, chest pain and abdominal pain.     Medical History Tanna has a past medical history of Anxiety, Bronchitis, Complication of anesthesia, Depression, Fibromyalgia (2005), GERD (gastroesophageal reflux disease), Insomnia, Migraine headache, Pneumonia, and PONV (postoperative nausea and vomiting).   Outpatient Encounter Medications as of 03/09/2021  Medication Sig  . albuterol (VENTOLIN HFA) 108 (90 Base) MCG/ACT inhaler Inhale 2 puffs into the lungs 4 (four) times daily as needed for wheezing or shortness of breath.  . ALPRAZolam (XANAX) 0.5 MG tablet Take 0.5 mg by mouth at bedtime as needed for sleep.  Marland Kitchen amoxicillin-clavulanate (AUGMENTIN) 875-125 MG tablet Take 1 tablet by mouth 2 (two) times daily.  . Cholecalciferol (VITAMIN D) 50 MCG (2000 UT) CAPS Take by mouth.  . mupirocin nasal ointment (BACTROBAN) 2 % Place 1 application into the nose 2 (two) times daily. Use one-half of tube in each nostril twice daily for five (5) days. After application, press sides of nose together and gently massage.  . ondansetron (ZOFRAN-ODT) 4 MG disintegrating tablet Take 1 tablet (4 mg total) by mouth every 8 (eight) hours as needed for nausea or vomiting.  .  pantoprazole (PROTONIX) 40 MG tablet Take 1 tablet (40 mg total) by mouth 2 (two) times daily before a meal. Take 30 minutes before breakfast  . rizatriptan (MAXALT-MLT) 10 MG disintegrating tablet Take 10 mg by mouth as needed for migraine. May repeat in 2 hours if needed  . sertraline (ZOLOFT) 25 MG tablet Take 1 tablet by mouth daily (Patient taking differently: Take 25 mg by mouth at bedtime.)  . tiZANidine (ZANAFLEX) 4 MG tablet Take one tablet po BID prn spasms  . XIIDRA 5 % SOLN Place 1 drop into both eyes 2 (two) times daily.  . [DISCONTINUED] mupirocin ointment (BACTROBAN) 2 % May apply thin amount twice daily as needed to the naris  . [DISCONTINUED] sertraline (ZOLOFT) 25 MG tablet Take 1 tablet (25 mg total) by mouth daily.  . [DISCONTINUED] Vitamin D, Ergocalciferol, (DRISDOL) 1.25 MG (50000 UT) CAPS capsule Take 50,000 Units by mouth every 7 (seven) days. Wednesdays   No facility-administered encounter medications on file as of 03/09/2021.     Review of Systems  Constitutional: Positive for chills. Negative for fever.  HENT: Positive for congestion, ear pain, sinus pressure, sinus pain and sore throat.   Respiratory: Positive for cough. Negative for shortness of breath.   Cardiovascular: Negative for chest pain.  Gastrointestinal: Negative for abdominal pain, nausea and vomiting.  Neurological: Positive for headaches.     Vitals Pulse 77   Temp 98.2 F (36.8 C)   Resp 16   Wt 164 lb (74.4 kg)   SpO2 98%   BMI 30.00  kg/m   Objective:   Physical Exam Vitals reviewed.  Constitutional:      Appearance: Normal appearance.  HENT:     Right Ear: Tympanic membrane is erythematous.     Left Ear: Tympanic membrane normal.     Nose:     Left Turbinates: Swollen.     Right Sinus: Maxillary sinus tenderness and frontal sinus tenderness present.     Left Sinus: Maxillary sinus tenderness and frontal sinus tenderness present.     Mouth/Throat:     Pharynx: Uvula midline.  Posterior oropharyngeal erythema present.     Tonsils: No tonsillar exudate.  Cardiovascular:     Rate and Rhythm: Normal rate and regular rhythm.     Heart sounds: Normal heart sounds.  Pulmonary:     Effort: Pulmonary effort is normal.     Breath sounds: Normal breath sounds.  Skin:    General: Skin is warm and dry.  Neurological:     General: No focal deficit present.     Mental Status: She is alert.  Psychiatric:        Behavior: Behavior normal.       Assessment and Plan   1. Non-recurrent acute suppurative otitis media of right ear without spontaneous rupture of tympanic membrane - amoxicillin-clavulanate (AUGMENTIN) 875-125 MG tablet; Take 1 tablet by mouth 2 (two) times daily.  Dispense: 20 tablet; Refill: 0  2. Sore throat - POCT rapid strep A   Rapid strep negative, will send for culture.  Will treat right otitis media with antibiotic for 10 days.  Recommend supportive therapy, adequate hydration, and sinus rinses.  Continue taking allergy medication.   Agrees with plan of care discussed today. Understands warning signs to seek further care: chest pain, shortness of breath, any significant change in health.  Understands to follow-up if symptoms do not improve, or worsen.  Notify the office once Covid results are available.    Chalmers Guest, NP 03/09/2021

## 2021-03-09 NOTE — Patient Instructions (Addendum)
Otitis Media, Adult  Otitis media is a condition in which the middle ear is red and swollen (inflamed) and full of fluid. The middle ear is the part of the ear that contains bones for hearing as well as air that helps send sounds to the brain. The condition usually goes away on its own. What are the causes? This condition is caused by a blockage in the eustachian tube. The eustachian tube connects the middle ear to the back of the nose. It normally allows air into the middle ear. The blockage is caused by fluid or swelling. Problems that can cause blockage include:  A cold or infection that affects the nose, mouth, or throat.  Allergies.  An irritant, such as tobacco smoke.  Adenoids that have become large. The adenoids are soft tissue located in the back of the throat, behind the nose and the roof of the mouth.  Growth or swelling in the upper part of the throat, just behind the nose (nasopharynx).  Damage to the ear caused by change in pressure. This is called barotrauma. What are the signs or symptoms? Symptoms of this condition include:  Ear pain.  Fever.  Problems with hearing.  Being tired.  Fluid leaking from the ear.  Ringing in the ear. How is this treated? This condition can go away on its own within 3-5 days. But if the condition is caused by bacteria or does not go away on its own, or if it keeps coming back, your doctor may:  Give you antibiotic medicines.  Give you medicines for pain. Follow these instructions at home:  Take over-the-counter and prescription medicines only as told by your doctor.  If you were prescribed an antibiotic medicine, take it as told by your doctor. Do not stop taking the antibiotic even if you start to feel better.  Keep all follow-up visits as told by your doctor. This is important. Contact a doctor if:  You have bleeding from your nose.  There is a lump on your neck.  You are not feeling better in 5 days.  You feel worse  instead of better. Get help right away if:  You have pain that is not helped with medicine.  You have swelling, redness, or pain around your ear.  You get a stiff neck.  You cannot move part of your face (paralysis).  You notice that the bone behind your ear hurts when you touch it.  You get a very bad headache. Summary  Otitis media means that the middle ear is red, swollen, and full of fluid.  This condition usually goes away on its own.  If the problem does not go away, treatment may be needed. You may be given medicines to treat the infection or to treat your pain.  If you were prescribed an antibiotic medicine, take it as told by your doctor. Do not stop taking the antibiotic even if you start to feel better.  Keep all follow-up visits as told by your doctor. This is important. This information is not intended to replace advice given to you by your health care provider. Make sure you discuss any questions you have with your health care provider. Document Revised: 10/31/2019 Document Reviewed: 10/31/2019 Elsevier Patient Education  2021 [place]. How to Perform a Sinus Rinse A sinus rinse is a home treatment. It rinses your sinuses with a mixture of salt and water (saline solution). Sinuses are air-filled spaces in your skull behind the bones of your face and forehead. They  open into your nasal cavity. A sinus rinse can help to clear your nasal cavity. It can clear mucus, dirt, dust, or pollen. You may do a sinus rinse when you have:  A cold.  A virus.  Allergies.  A sinus infection.  A stuffy nose. Talk with your doctor about whether a sinus rinse might help you. What are the risks? A sinus rinse is normally very safe and helpful. However, there are a few risks. These include:  A burning feeling in the sinuses. This may happen if you do not make the saline solution as instructed. Be sure to follow all directions when making the saline solution.  Nasal  irritation.  Infection from unclean water. This is rare, but possible. Do not do a sinus rinse if you have had:  Ear or nasal surgery.  An ear infection.  Blocked ears. Supplies needed:  Saline solution or powder.  Distilled or germ-free (sterile) water may be needed to mix with saline powder. ? You may use boiled and cooled tap water. Boil tap water for 5 minutes; cool until it is lukewarm. Use within 24 hours. ? Do not use regular tap water to mix with the saline solution.  Neti pot or nasal rinse bottle. This releases the saline solution into your nose and through your sinuses. You can buy neti pots and rinse bottles: ? At your local pharmacy. ? At a health food store. ? Online. How to perform a sinus rinse 1. Wash your hands with soap and water. 2. Wash your device using the directions that came with it. 3. Dry your device. 4. Use the solution that comes with your device or one that is sold separately in stores. Follow the mixing directions on the package if you need to mix with sterile or distilled water. 5. Fill your device with the amount of saline solution stated in the device instructions. 6. Stand over a sink and tilt your head sideways over the sink. 7. Place the spout of the device in your upper nostril (the one closer to the ceiling). 8. Gently pour or squeeze the saline solution into your nasal cavity. The liquid should drain to your lower nostril if you are not too stuffed up (congested). 9. While rinsing, breathe through your open mouth. 10. Gently blow your nose to clear any mucus and rinse solution. Blowing too hard may cause ear pain. 11. Repeat in your other nostril. 12. Clean and rinse your device with clean water. 13. Air-dry your device. Talk with your doctor or pharmacist if you have questions about how to do a sinus rinse.   Summary  A sinus rinse is a home treatment. It rinses your sinuses with a mixture of salt and water (saline solution).  A sinus  rinse is normally very safe and helpful. Follow all instructions carefully.  Talk with your doctor about whether a sinus rinse might help you. This information is not intended to replace advice given to you by your health care provider. Make sure you discuss any questions you have with your health care provider. Document Revised: 09/08/2020 Document Reviewed: 09/08/2020 Elsevier Patient Education  2021 [employer].

## 2021-03-11 LAB — SPECIMEN STATUS REPORT

## 2021-03-11 LAB — STREP A DNA PROBE: Strep Gp A Direct, DNA Probe: NEGATIVE

## 2021-03-13 ENCOUNTER — Other Ambulatory Visit: Payer: Self-pay

## 2021-03-13 MED FILL — Tizanidine HCl Tab 4 MG (Base Equivalent): ORAL | 18 days supply | Qty: 36 | Fill #0 | Status: AC

## 2021-03-15 ENCOUNTER — Ambulatory Visit (INDEPENDENT_AMBULATORY_CARE_PROVIDER_SITE_OTHER): Payer: 59 | Admitting: Psychiatry

## 2021-03-15 ENCOUNTER — Other Ambulatory Visit: Payer: Self-pay

## 2021-03-15 ENCOUNTER — Encounter: Payer: Self-pay | Admitting: Psychiatry

## 2021-03-15 VITALS — BP 113/70 | HR 82 | Temp 97.5°F | Wt 167.0 lb

## 2021-03-15 DIAGNOSIS — F4310 Post-traumatic stress disorder, unspecified: Secondary | ICD-10-CM | POA: Diagnosis not present

## 2021-03-15 DIAGNOSIS — F3341 Major depressive disorder, recurrent, in partial remission: Secondary | ICD-10-CM

## 2021-03-15 MED ORDER — SERTRALINE HCL 25 MG PO TABS
25.0000 mg | ORAL_TABLET | Freq: Every day | ORAL | 0 refills | Status: DC
Start: 2021-03-15 — End: 2021-06-07
  Filled 2021-03-15: qty 90, 90d supply, fill #0

## 2021-03-15 NOTE — Patient Instructions (Signed)
1. Continue sertraline 25 mg daily  2. Next appointment- in 3 months, in person visit

## 2021-03-16 ENCOUNTER — Other Ambulatory Visit: Payer: Self-pay

## 2021-03-16 ENCOUNTER — Ambulatory Visit: Payer: 59 | Admitting: Gastroenterology

## 2021-03-16 MED FILL — Pantoprazole Sodium EC Tab 40 MG (Base Equiv): ORAL | 30 days supply | Qty: 60 | Fill #0 | Status: AC

## 2021-03-22 NOTE — Addendum Note (Signed)
Addended by: Dairl Ponder on: 03/22/2021 01:33 PM   Modules accepted: Orders

## 2021-03-23 DIAGNOSIS — F419 Anxiety disorder, unspecified: Secondary | ICD-10-CM | POA: Diagnosis not present

## 2021-03-23 DIAGNOSIS — R5381 Other malaise: Secondary | ICD-10-CM | POA: Diagnosis not present

## 2021-03-23 DIAGNOSIS — G43909 Migraine, unspecified, not intractable, without status migrainosus: Secondary | ICD-10-CM | POA: Diagnosis not present

## 2021-03-23 DIAGNOSIS — R7303 Prediabetes: Secondary | ICD-10-CM | POA: Diagnosis not present

## 2021-03-23 DIAGNOSIS — E039 Hypothyroidism, unspecified: Secondary | ICD-10-CM | POA: Diagnosis not present

## 2021-03-23 DIAGNOSIS — E663 Overweight: Secondary | ICD-10-CM | POA: Diagnosis not present

## 2021-03-23 DIAGNOSIS — Z79899 Other long term (current) drug therapy: Secondary | ICD-10-CM | POA: Diagnosis not present

## 2021-03-23 DIAGNOSIS — F4310 Post-traumatic stress disorder, unspecified: Secondary | ICD-10-CM | POA: Diagnosis not present

## 2021-03-23 DIAGNOSIS — E559 Vitamin D deficiency, unspecified: Secondary | ICD-10-CM | POA: Diagnosis not present

## 2021-03-23 DIAGNOSIS — R7989 Other specified abnormal findings of blood chemistry: Secondary | ICD-10-CM | POA: Diagnosis not present

## 2021-03-23 DIAGNOSIS — E785 Hyperlipidemia, unspecified: Secondary | ICD-10-CM | POA: Diagnosis not present

## 2021-03-23 DIAGNOSIS — R5383 Other fatigue: Secondary | ICD-10-CM | POA: Diagnosis not present

## 2021-03-23 DIAGNOSIS — Z8249 Family history of ischemic heart disease and other diseases of the circulatory system: Secondary | ICD-10-CM | POA: Diagnosis not present

## 2021-03-23 DIAGNOSIS — E7800 Pure hypercholesterolemia, unspecified: Secondary | ICD-10-CM | POA: Diagnosis not present

## 2021-03-23 DIAGNOSIS — K219 Gastro-esophageal reflux disease without esophagitis: Secondary | ICD-10-CM | POA: Diagnosis not present

## 2021-03-24 ENCOUNTER — Other Ambulatory Visit: Payer: Self-pay

## 2021-03-24 ENCOUNTER — Ambulatory Visit (INDEPENDENT_AMBULATORY_CARE_PROVIDER_SITE_OTHER): Payer: 59 | Admitting: Licensed Clinical Social Worker

## 2021-03-24 DIAGNOSIS — F4310 Post-traumatic stress disorder, unspecified: Secondary | ICD-10-CM | POA: Diagnosis not present

## 2021-03-24 MED ORDER — PREDNISONE 10 MG PO TABS
ORAL_TABLET | ORAL | 0 refills | Status: DC
  Filled 2021-03-24: qty 21, 6d supply, fill #0

## 2021-03-24 NOTE — Progress Notes (Signed)
Outpatient Therapy  Note  I connected with Nichole Brown on 03/24/21 at  4:00 PM EDT .   Location: Patient: [name] Provider: [name]   I discussed the assessment and treatment plan with the patient. The patient was provided an opportunity to ask questions and all were answered. The patient agreed with the plan and demonstrated an understanding of the instructions.   I provided 60 minutes of face-to-face time during this encounter.   Burdett Pinzon R Ingri Diemer, LCSW    THERAPIST PROGRESS NOTE  Session Time: 4-5p  Participation Level: Active  Behavioral Response: Neat and Well GroomedAlertAnxious  Type of Therapy: Individual Therapy  Treatment Goals addressed: Anxiety  Interventions: CBT and Reframing  Summary: Nichole Brown is a 55 y.o. female who presents with symptoms consistent with PTSD/anxiety. Pt reports that overall mood is stable and that she is managing situational stress and anxiety well. Pt reports that she is compliant with medications.   Allowed pt to explore and express thoughts and feelings surrounding the following: son's death, daughter's continued support, husband and pt travelling together, work enjoyment, utilization of coping skills that are working.   Reviewed techniques of mindfulness and meditation. Encouraged continuing physical activity.   Continued recommendations are as follows: self care behaviors, positive social engagements, focusing on overall work/home/life balance, and focusing on positive physical and emotional wellness.   Suicidal/Homicidal: No  Therapist Response: Nichole Brown is able to show evidence of more energy, activities, and socializing with others. Nichole Brown is developing the ability to recognize, accept, and cope with feelings of depression.Nichole Brown is beginning to identify major life conflicts from the past and present that form the basis for present anxiety. These behaviors are reflective of both personal growth and progress. Treatment to continue  as indicated.   Plan: Return again in 4 weeks.  Diagnosis: Axis I: Post Traumatic Stress Disorder    Axis II: No diagnosis    Nichole Bo Jennifermarie Franzen, LCSW 03/24/2021

## 2021-03-29 ENCOUNTER — Encounter: Payer: Self-pay | Admitting: Radiology

## 2021-04-14 ENCOUNTER — Other Ambulatory Visit
Admission: RE | Admit: 2021-04-14 | Discharge: 2021-04-14 | Disposition: A | Discharge status: Home / Self Care | Payer: 59 | Source: Ambulatory Visit | Attending: Family Medicine | Admitting: Family Medicine

## 2021-04-14 DIAGNOSIS — D519 Vitamin B12 deficiency anemia, unspecified: Secondary | ICD-10-CM | POA: Diagnosis not present

## 2021-04-14 DIAGNOSIS — E559 Vitamin D deficiency, unspecified: Secondary | ICD-10-CM | POA: Insufficient documentation

## 2021-04-14 DIAGNOSIS — D649 Anemia, unspecified: Secondary | ICD-10-CM | POA: Insufficient documentation

## 2021-04-14 LAB — RETICULOCYTES
Immature Retic Fract: 7.2 % (ref 2.3–15.9)
RBC.: 4.6 MIL/uL (ref 3.87–5.11)
Retic Count, Absolute: 54.3 10*3/uL (ref 19.0–186.0)
Retic Ct Pct: 1.2 % (ref 0.4–3.1)

## 2021-04-14 LAB — IRON AND TIBC
Iron: 102 ug/dL (ref 28–170)
Saturation Ratios: 30 % (ref 10.4–31.8)
TIBC: 336 ug/dL (ref 250–450)
UIBC: 234 ug/dL

## 2021-04-14 LAB — FOLATE: Folate: 35 ng/mL (ref >5.9)

## 2021-04-14 LAB — FERRITIN: Ferritin: 153 ng/mL (ref 11–307)

## 2021-04-14 LAB — VITAMIN D 25 HYDROXY (VIT D DEFICIENCY, FRACTURES): Vit D, 25-Hydroxy: 20.25 ng/mL — ABNORMAL LOW (ref 30–100)

## 2021-04-14 LAB — VITAMIN B12: Vitamin B-12: 311 pg/mL (ref 180–914)

## 2021-04-20 DIAGNOSIS — M722 Plantar fascial fibromatosis: Secondary | ICD-10-CM | POA: Diagnosis not present

## 2021-04-20 DIAGNOSIS — F4310 Post-traumatic stress disorder, unspecified: Secondary | ICD-10-CM | POA: Diagnosis not present

## 2021-04-20 DIAGNOSIS — K219 Gastro-esophageal reflux disease without esophagitis: Secondary | ICD-10-CM | POA: Diagnosis not present

## 2021-04-20 DIAGNOSIS — R7303 Prediabetes: Secondary | ICD-10-CM | POA: Diagnosis not present

## 2021-05-04 ENCOUNTER — Encounter: Payer: Self-pay | Admitting: Gastroenterology

## 2021-05-04 ENCOUNTER — Ambulatory Visit: Payer: 59 | Admitting: Gastroenterology

## 2021-05-04 ENCOUNTER — Other Ambulatory Visit: Payer: Self-pay

## 2021-05-04 VITALS — BP 116/74 | HR 63 | Temp 97.5°F | Ht 62.0 in | Wt 166.4 lb

## 2021-05-04 DIAGNOSIS — Z1283 Encounter for screening for malignant neoplasm of skin: Secondary | ICD-10-CM | POA: Diagnosis not present

## 2021-05-04 DIAGNOSIS — K219 Gastro-esophageal reflux disease without esophagitis: Secondary | ICD-10-CM

## 2021-05-04 DIAGNOSIS — D1801 Hemangioma of skin and subcutaneous tissue: Secondary | ICD-10-CM | POA: Diagnosis not present

## 2021-05-04 DIAGNOSIS — L218 Other seborrheic dermatitis: Secondary | ICD-10-CM | POA: Diagnosis not present

## 2021-05-04 DIAGNOSIS — L570 Actinic keratosis: Secondary | ICD-10-CM | POA: Diagnosis not present

## 2021-05-04 MED ORDER — PANTOPRAZOLE SODIUM 40 MG PO TBEC
40.0000 mg | DELAYED_RELEASE_TABLET | Freq: Every day | ORAL | 3 refills | Status: DC
  Filled 2021-05-04: qty 90, 90d supply, fill #0

## 2021-05-04 NOTE — Patient Instructions (Signed)
I have decreased Protonix down to once per day. Let us know if this is not tolerated and you have recurrent symptoms!  We will see you back in November for routine follow-up and can arrange colonoscopy at that time!  I enjoyed seeing you again today! As you know, I value our relationship and want to provide genuine, compassionate, and quality care. I welcome your feedback. If you receive a survey regarding your visit,  I greatly appreciate you taking time to fill this out. See you next time!  Annitta Needs, PhD, ANP-BC [facility]

## 2021-05-04 NOTE — Progress Notes (Signed)
Referring Provider: Erven Colla, DO Primary GI: Dr. Abbey Chatters  Chief Complaint  Patient presents with  . Gastroesophageal Reflux    Doing ok. Protonix has lowered vitamin D    HPI:   Nichole Brown is a 55 y.o. female presenting today with a history of GERD, dysphagia, colon polyps with surveillance due in Dec 2022, presenting for follow-up after EGD.   EGD completed Feb 2022 with benign-appearing esophageal stenosis s/p dilation, gastritis, normal duodenum. Mild chronic gastritis.   Dysphagia resolved. No abdominal pain. No overt GI bleeding. No constipation, diarrhea, change in appetite, loss of appetite. Taking supplemental Vit D. Currently PPI BID.   Past Medical History:  Diagnosis Date  . Anxiety   . Bronchitis   . Complication of anesthesia    PONV  . Depression   . Fibromyalgia 2005  . GERD (gastroesophageal reflux disease)   . Insomnia   . Migraine headache   . Pneumonia   . PONV (postoperative nausea and vomiting)     Past Surgical History:  Procedure Laterality Date  . ABDOMINAL HYSTERECTOMY    . BALLOON DILATION N/A 12/14/2020   Procedure: BALLOON DILATION;  Surgeon: Eloise Harman, DO;  [facility];  Service: Endoscopy;  Laterality: N/A;  . BIOPSY  12/14/2020   Procedure: BIOPSY;  Surgeon: Eloise Harman, DO;  [facility];  Service: Endoscopy;;  . CESAREAN SECTION    . CHOLECYSTECTOMY  1988  . COLONOSCOPY N/A 12/10/2018    six 4-8 mm polyps in proximal descending colon, mid transverse and hepatic flexure. Diverticulosis. External and internal hemorrhoids. Torturous colon. Two simple adenomas, two serrated polyps, 2 hyperplastic polyps. Surveillance due Dec 2022.   . ESOPHAGOGASTRODUODENOSCOPY (EGD) WITH PROPOFOL N/A 12/14/2020   Benign-appearing esophageal stenosis s/p dilation, gastritis, normal duodenum. Mild chronic gastritis.   Marland Kitchen open heart surgery  1967  . PATENT DUCTUS ARTERIOUS REPAIR  1967  . POLYPECTOMY   12/10/2018   Procedure: POLYPECTOMY;  Surgeon: Danie Binder, MD;  [facility];  Service: Endoscopy;;  hepatic flexure, descending, transverse  . SHOULDER ARTHROSCOPY Left 06/16/2017   Procedure: LEFT SHOULDER ARTHROSCOPY, DEBRIDEMENT, AND DECOMPRESSION;  Surgeon: Newt Minion, MD;  Location: [place];  Service: Orthopedics;  Laterality: Left;  . SHOULDER ARTHROSCOPY WITH SUBACROMIAL DECOMPRESSION Left 10/03/2019   Procedure: SHOULDER ARTHROSCOPY WITH DEBRIDEMENT, DECOMPRESSION;  Surgeon: Corky Mull, MD;  Location: [facility];  Service: Orthopedics;  Laterality: Left;  . SHOULDER SURGERY Left 2008   [facility] joint    Current Outpatient Medications  Medication Sig Dispense Refill  . albuterol (VENTOLIN HFA) 108 (90 Base) MCG/ACT inhaler Inhale 2 puffs into the lungs 4 (four) times daily as needed for wheezing or shortness of breath. 18 g 2  . Cholecalciferol (VITAMIN D) 50 MCG (2000 UT) CAPS Take by mouth daily.    . ondansetron (ZOFRAN-ODT) 4 MG disintegrating tablet Take 1 tablet (4 mg total) by mouth every 8 (eight) hours as needed for nausea or vomiting. 20 tablet 0  . rizatriptan (MAXALT-MLT) 10 MG disintegrating tablet Take 10 mg by mouth as needed for migraine. May repeat in 2 hours if needed    . sertraline (ZOLOFT) 25 MG tablet Take 1 tablet (25 mg total) by mouth at bedtime. 90 tablet 0  . tiZANidine (ZANAFLEX) 4 MG tablet TAKE 1 TABLET BY MOUTH TWICE DAILY AS NEEDED FOR SPASMS 36 tablet 0  . XIIDRA 5 % SOLN Place 1 drop  into both eyes 2 (two) times daily.    . pantoprazole (PROTONIX) 40 MG tablet Take 1 tablet (40 mg total) by mouth daily. 30 minutes before breakfast 90 tablet 3   No current facility-administered medications for this visit.    Allergies as of 05/04/2021 - Review Complete 05/04/2021  Allergen Reaction Noted  . Cymbalta [duloxetine hcl] Other (See Comments) 02/09/2017  . Nortriptyline Nausea And Vomiting 02/09/2017  . Phenergan [promethazine hcl]  12/10/2018     Family History  Problem Relation Age of Onset  . Hypertension Maternal Grandmother   . Diabetes Maternal Grandmother   . Heart disease Maternal Grandmother 40       MI  . Hypertension Maternal Grandfather   . Throat cancer Maternal Grandfather   . Breast cancer Mother 59  . Hypertension Father   . Colon cancer Neg Hx     Social History   Socioeconomic History  . Marital status: Married    Spouse name: daniel  . Number of children: 4  . Years of education: Not on file  . Highest education level: Some college, no degree  Occupational History  . Not on file  Tobacco Use  . Smoking status: Former Research scientist (life sciences)  . Smokeless tobacco: Never Used  Vaping Use  . Vaping Use: Never used  Substance and Sexual Activity  . Alcohol use: Yes    Comment: occassionally  . Drug use: No  . Sexual activity: Yes    Birth control/protection: Surgical  Other Topics Concern  . Not on file  Social History Narrative  . Not on file   Social Determinants of Health   Financial Resource Strain: Not on file  Food Insecurity: Not on file  Transportation Needs: Not on file  Physical Activity: Not on file  Stress: Not on file  Social Connections: Not on file    Review of Systems: Gen: Denies fever, chills, anorexia. Denies fatigue, weakness, weight loss.  CV: Denies chest pain, palpitations, syncope, peripheral edema, and claudication. Resp: Denies dyspnea at rest, cough, wheezing, coughing up blood, and pleurisy. GI: see HPI Derm: Denies rash, itching, dry skin Psych: Denies depression, anxiety, memory loss, confusion. No homicidal or suicidal ideation.  Heme: Denies bruising, bleeding, and enlarged lymph nodes.  Physical Exam: BP 116/74   Pulse 63   Temp (!) 97.5 F (36.4 C) (Temporal)   Ht 5\' 2"  (1.575 m)   Wt 166 lb 6.4 oz (75.5 kg)   BMI 30.43 kg/m  General:   Alert and oriented. No distress noted. Pleasant and cooperative.  Head:  Normocephalic and atraumatic. Eyes:  Conjuctiva  clear without scleral icterus. Mouth:  Mask in place Abdomen:  +BS, soft, non-tender and non-distended. No rebound or guarding. No HSM or masses noted. Msk:  Symmetrical without gross deformities. Normal posture. Extremities:  Without edema. Neurologic:  Alert and  oriented x4 Psych:  Alert and cooperative. Normal mood and affect.  ASSESSMENT: Nichole Brown is a 55 y.o. female presenting today with a history of GERD, dysphagia, colon polyps with surveillance due in Dec 2022, presenting for follow-up after EGD.   Dysphagia resolved s/p dilation. Remains on PPI BID, and we will decrease this to once daily if tolerated.   As she is doing well, will have her return in 6 months and arrange colonoscopy at that time.     PLAN:  Decrease PPI to once daily Return in Nov 2022 Colonoscopy Dec 2022  Annitta Needs, PhD, ANP-BC [facility]

## 2021-05-05 ENCOUNTER — Other Ambulatory Visit: Payer: Self-pay

## 2021-05-05 ENCOUNTER — Ambulatory Visit (INDEPENDENT_AMBULATORY_CARE_PROVIDER_SITE_OTHER): Payer: 59 | Admitting: Licensed Clinical Social Worker

## 2021-05-05 DIAGNOSIS — F4310 Post-traumatic stress disorder, unspecified: Secondary | ICD-10-CM

## 2021-05-05 NOTE — Progress Notes (Addendum)
In Office Outpatient Therapy Note  I connected with Nichole Brown on 05/05/21 at  4:00 PM EDT and verified that I am speaking with the correct person using two identifiers.  Location: Patient: [name]  Provider: [name]   I discussed the assessment and treatment plan with the patient. The patient was provided an opportunity to ask questions and all were answered. The patient agreed with the plan and demonstrated an understanding of the instructions.  I provided 60 minutes of face-to-face time during this encounter.   Nichole Ostenson R Libbey Duce, LCSW    THERAPIST PROGRESS NOTE  Session Time: 4-5p  Participation Level: Active  Behavioral Response: Neat and Well GroomedAlertAnxious  Type of Therapy: Individual Therapy  Treatment Goals addressed: Anxiety  Interventions: Supportive and Other: trauma focused  Summary: Nichole Brown is a 55 y.o. female who presents with symptoms consistent with PTSD. Pt reports that she is still experiencing some situational and ongoing anxiety. Reviewed anxiety management.  Pt reports that she has been compliant with medication (sertraline). Pt is reporting an increase in headaches recently and wants to consult with neurologist about going back on topamax. Pt stopped the topamax when starting sertraline--but cannot deal with the pain and the increase of headaches that this change has triggered.  Allowed pt to explore and express thoughts and feelings about recent external stressors and life events. Pt reports that she recently found out that her stepdaughter is pregnant and that her daughter and husband are having issues. Pt expressed her concerns about this, and overall psychological impact on her husband.  Allowed pt to share her thoughts and concerns about husband's health. Pts husband is continuing to experience heart related concerns.  Pt has some specific fears about husband--allowed pt to explore the fears.   Clinician validated pts feelings and  offered unconditional support and positive regard.  Continued recommendations are as follows: self care behaviors, positive social engagements, focusing on overall work/home/life balance, and focusing on positive physical and emotional wellness. .   Suicidal/Homicidal: No  Therapist Response: [name] is continuing to identify stress triggers and utilize stress management techniques that have been successful for her in the past. Pt is prioritizing self care, positive social supports, and is engaging in recreational activities to help boost mood. These behaviors are reflective of both personal growth and progress. Treatment to continue as indicated.  Plan: Return again in 4 weeks.  Diagnosis: Axis I: Post Traumatic Stress Disorder    Axis II: No diagnosis    [place], LCSW 05/05/2021

## 2021-05-05 NOTE — Progress Notes (Signed)
Cc'ed to pcp °

## 2021-05-17 ENCOUNTER — Other Ambulatory Visit: Payer: Self-pay
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
## 2021-06-04 ENCOUNTER — Other Ambulatory Visit: Payer: Self-pay

## 2021-06-04 DIAGNOSIS — G43709 Chronic migraine without aura, not intractable, without status migrainosus: Secondary | ICD-10-CM | POA: Diagnosis not present

## 2021-06-04 MED ORDER — PROPRANOLOL HCL 10 MG PO TABS
ORAL_TABLET | ORAL | 5 refills | Status: DC
  Filled 2021-06-04: qty 90, 30d supply, fill #0

## 2021-06-04 MED ORDER — TOPIRAMATE 25 MG PO TABS
ORAL_TABLET | ORAL | 5 refills | Status: DC
  Filled 2021-06-04: qty 60, 30d supply, fill #0

## 2021-06-04 MED ORDER — ONDANSETRON 4 MG PO TBDP
ORAL_TABLET | ORAL | 11 refills | Status: DC
  Filled 2021-06-04: qty 20, 7d supply, fill #0
  Filled 2021-10-11: qty 20, 7d supply, fill #1

## 2021-06-04 MED ORDER — RIZATRIPTAN BENZOATE 10 MG PO TBDP
ORAL_TABLET | ORAL | 11 refills | Status: DC
  Filled 2021-06-04: qty 10, 30d supply, fill #0
  Filled 2021-10-11: qty 10, 30d supply, fill #1

## 2021-06-07 ENCOUNTER — Other Ambulatory Visit: Payer: Self-pay

## 2021-06-07 ENCOUNTER — Encounter: Payer: Self-pay | Admitting: Psychiatry

## 2021-06-07 ENCOUNTER — Ambulatory Visit (INDEPENDENT_AMBULATORY_CARE_PROVIDER_SITE_OTHER): Payer: 59 | Admitting: Psychiatry

## 2021-06-07 VITALS — BP 103/69 | HR 83 | Temp 97.1°F | Wt 165.0 lb

## 2021-06-07 DIAGNOSIS — F4310 Post-traumatic stress disorder, unspecified: Secondary | ICD-10-CM | POA: Diagnosis not present

## 2021-06-07 DIAGNOSIS — F3341 Major depressive disorder, recurrent, in partial remission: Secondary | ICD-10-CM

## 2021-06-07 MED ORDER — ESCITALOPRAM OXALATE 10 MG PO TABS
ORAL_TABLET | ORAL | 1 refills | Status: DC
  Filled 2021-06-07 – 2021-06-21 (×2): qty 30, 30d supply, fill #0

## 2021-06-16 ENCOUNTER — Other Ambulatory Visit: Payer: Self-pay

## 2021-06-16 ENCOUNTER — Ambulatory Visit (INDEPENDENT_AMBULATORY_CARE_PROVIDER_SITE_OTHER): Payer: 59 | Admitting: Licensed Clinical Social Worker

## 2021-06-16 DIAGNOSIS — Z5329 Procedure and treatment not carried out because of patient's decision for other reasons: Secondary | ICD-10-CM

## 2021-06-16 NOTE — Progress Notes (Signed)
LCSW counselor tried to connect with patient for scheduled appointment via MyChart video text request x 2 and email request; also tried to connect via phone without success. LCSW counselor left message for patient to call office number to reschedule OPT appointment.

## 2021-06-17 ENCOUNTER — Ambulatory Visit (INDEPENDENT_AMBULATORY_CARE_PROVIDER_SITE_OTHER): Payer: 59 | Admitting: Licensed Clinical Social Worker

## 2021-06-17 DIAGNOSIS — F4310 Post-traumatic stress disorder, unspecified: Secondary | ICD-10-CM

## 2021-06-17 NOTE — Progress Notes (Signed)
Virtual Visit via Audio Note  I connected with Nichole Brown on 06/17/21 at  2:00 PM EDT by an audio enabled telemedicine application and verified that I am speaking with the correct person using two identifiers.  Location: Patient: home Provider: remote office [place], [place])   I discussed the limitations of evaluation and management by telemedicine and the availability of in person appointments. The patient expressed understanding and agreed to proceed.   I discussed the assessment and treatment plan with the patient. The patient was provided an opportunity to ask questions and all were answered. The patient agreed with the plan and demonstrated an understanding of the instructions.   The patient was advised to call back or seek an in-person evaluation if the symptoms worsen or if the condition fails to improve as anticipated.  I provided 45 minutes of non-face-to-face time during this encounter.   Nichole Leer R Majel Giel, LCSW  THERAPIST PROGRESS NOTE  Session Time: 2-2:45p  Participation Level: Active  Behavioral Response: NAAlertAnxious  Type of Therapy: Individual Therapy  Treatment Goals addressed: Anxiety and Coping  Interventions: CBT and Other: trauma focused  Summary: Nichole Brown Brown a 55 y.o. female who presents with continuing symptoms related to PTSD/anxiety diagnosis.  Patient reports that overall mood has been stable, and that she Brown managing external stressors fairly well.   Allowed patient to explore and express thoughts and feelings associated with recent stressors, and patient reports that she has been struggling with anxiety after an incident that happened recently at her mothers home. Patient reports that she opened the door to step outside, and stepped on a black snake. Patient reports that she panicked at that moment and started screaming and asking family members to help her. Family members rushed outside to contain the snake, and as patient was re  entering the home another snake appeared. Patient reports that she has been dreaming about snakes since the incident and Brown feeling hypervigilant and jumpy. Allowed patient to share her story and express thoughts and feelings associated with the entire incident. The conversation triggered patients fear of death, and fear of finding husband dead. Discussed trauma with patient and discussed the body's physiological response to perceived danger both "in the moment" and when reflecting about it later.  Encouraged patient to utilize coping skills that have been helpful in the past to manage stress and anxiety.   Discussed importance of self care, relaxation, and physical activity. Discussed relationship with husband, and patient reports that they are spending good time together and engaging in recreational activities on a regular basis..   Continued recommendations are as follows: self care behaviors, positive social engagements, focusing on overall work/home/life balance, and focusing on positive physical and emotional wellness.   Suicidal/Homicidal: No  Therapist Response: Nichole Brown doing a great job of identifying specific situations that trigger anxiety, and utilizing anxiety management strategies in the moment. Patient Brown able to verbalize and understanding of how thoughts, feelings, and behaviors contribute to overall anxiety and treatment of anxiety. Patient Brown able to discuss traumas from the past without becoming overly emotional. These behaviors are reflective of continuing personal growth and progress. Treatment to continue as indicated.  Plan: Return again in 4 weeks.  Diagnosis: Axis I: Post Traumatic Stress Disorder    Axis II: No diagnosis    [place], LCSW 06/17/2021

## 2021-06-18 ENCOUNTER — Ambulatory Visit: Payer: 59 | Admitting: Licensed Clinical Social Worker

## 2021-06-21 ENCOUNTER — Other Ambulatory Visit: Payer: Self-pay

## 2021-06-22 DIAGNOSIS — Z0000 Encounter for general adult medical examination without abnormal findings: Secondary | ICD-10-CM | POA: Diagnosis not present

## 2021-06-22 DIAGNOSIS — Z1239 Encounter for other screening for malignant neoplasm of breast: Secondary | ICD-10-CM | POA: Diagnosis not present

## 2021-06-22 DIAGNOSIS — R319 Hematuria, unspecified: Secondary | ICD-10-CM | POA: Diagnosis not present

## 2021-06-29 ENCOUNTER — Other Ambulatory Visit: Payer: Self-pay | Admitting: Family Medicine

## 2021-06-29 DIAGNOSIS — Z1231 Encounter for screening mammogram for malignant neoplasm of breast: Secondary | ICD-10-CM

## 2021-07-06 ENCOUNTER — Ambulatory Visit (INDEPENDENT_AMBULATORY_CARE_PROVIDER_SITE_OTHER): Payer: 59 | Admitting: Licensed Clinical Social Worker

## 2021-07-06 ENCOUNTER — Other Ambulatory Visit: Payer: Self-pay

## 2021-07-06 DIAGNOSIS — F4310 Post-traumatic stress disorder, unspecified: Secondary | ICD-10-CM

## 2021-07-06 NOTE — Progress Notes (Signed)
Virtual Visit via Video Note  I connected with Nichole Brown on 07/06/21 at  2:00 PM EDT by a video enabled telemedicine application and verified that I am speaking with the correct person using two identifiers.  Location: Patient: home Provider: remote office [place], [place])   I discussed the limitations of evaluation and management by telemedicine and the availability of in person appointments. The patient expressed understanding and agreed to proceed.  I discussed the assessment and treatment plan with the patient. The patient was provided an opportunity to ask questions and all were answered. The patient agreed with the plan and demonstrated an understanding of the instructions.   The patient was advised to call back or seek an in-person evaluation if the symptoms worsen or if the condition fails to improve as anticipated.  I provided 60 minutes of non-face-to-face time during this encounter.   Vera Wishart R Ramello Cordial, LCSW  THERAPIST PROGRESS NOTE  Session Time: 2-3p  Participation Level: Active  Behavioral Response: NAAlertAnxious and Depressed  Type of Therapy: Individual Therapy  Treatment Goals addressed: Anxiety  Interventions: CBT and Other: trauma focused  Summary: Nichole Brown is a 55 y.o. female who presents with escalating stress associated with recent external stressors. Pt reports that overall mood has been fluctuating depending on situations. Allowed pt to explore and express thoughts and feelings associated with recent life events and external stressors. Pt reports that after a trip to visit daughter, she had a "run in" with her over her daughter thinking that she was being pushy about concerns. Explored pts feelings about relationship with daughter. Gave pt unconditional positive support. Continued recommendations are as follows: self care behaviors, positive social engagements, focusing on overall work/home/life balance, and focusing on positive physical and  emotional wellness.   Suicidal/Homicidal: No  Therapist Response: Progress fluctuating/intermittent due to recent acute stressors.   Plan: Return again in 4 weeks.  Diagnosis: Axis I: Post Traumatic Stress Disorder    Axis II: No diagnosis    Rachel Bo [place], LCSW 07/06/2021

## 2021-07-08 ENCOUNTER — Other Ambulatory Visit: Payer: Self-pay

## 2021-07-08 ENCOUNTER — Ambulatory Visit
Admission: RE | Admit: 2021-07-08 | Discharge: 2021-07-08 | Disposition: A | Discharge status: Home / Self Care | Payer: 59 | Source: Ambulatory Visit | Attending: Family Medicine | Admitting: Family Medicine

## 2021-07-08 DIAGNOSIS — Z1231 Encounter for screening mammogram for malignant neoplasm of breast: Secondary | ICD-10-CM | POA: Diagnosis not present

## 2021-07-13 DIAGNOSIS — H01024 Squamous blepharitis left upper eyelid: Secondary | ICD-10-CM | POA: Diagnosis not present

## 2021-07-13 DIAGNOSIS — H01021 Squamous blepharitis right upper eyelid: Secondary | ICD-10-CM | POA: Diagnosis not present

## 2021-07-13 DIAGNOSIS — M25774 Osteophyte, right foot: Secondary | ICD-10-CM | POA: Diagnosis not present

## 2021-07-13 DIAGNOSIS — M722 Plantar fascial fibromatosis: Secondary | ICD-10-CM | POA: Diagnosis not present

## 2021-07-13 DIAGNOSIS — M79671 Pain in right foot: Secondary | ICD-10-CM | POA: Diagnosis not present

## 2021-07-13 DIAGNOSIS — H04123 Dry eye syndrome of bilateral lacrimal glands: Secondary | ICD-10-CM | POA: Diagnosis not present

## 2021-07-13 DIAGNOSIS — R7303 Prediabetes: Secondary | ICD-10-CM | POA: Diagnosis not present

## 2021-07-14 ENCOUNTER — Other Ambulatory Visit: Payer: Self-pay

## 2021-07-14 MED ORDER — PREDNISONE 10 MG PO TABS
ORAL_TABLET | ORAL | 0 refills | Status: DC
  Filled 2021-07-14: qty 21, 6d supply, fill #0

## 2021-07-14 MED ORDER — MELOXICAM 15 MG PO TABS
ORAL_TABLET | ORAL | 1 refills | Status: DC
  Filled 2021-07-14: qty 30, 30d supply, fill #0

## 2021-07-15 ENCOUNTER — Other Ambulatory Visit: Payer: Self-pay | Admitting: Family Medicine

## 2021-07-15 ENCOUNTER — Ambulatory Visit: Payer: 59

## 2021-07-15 DIAGNOSIS — M79671 Pain in right foot: Secondary | ICD-10-CM

## 2021-07-16 ENCOUNTER — Ambulatory Visit
Admission: RE | Admit: 2021-07-16 | Discharge: 2021-07-16 | Disposition: A | Discharge status: Home / Self Care | Payer: 59 | Source: Ambulatory Visit | Attending: Family Medicine | Admitting: Family Medicine

## 2021-07-16 DIAGNOSIS — M19071 Primary osteoarthritis, right ankle and foot: Secondary | ICD-10-CM | POA: Diagnosis not present

## 2021-07-16 DIAGNOSIS — M79671 Pain in right foot: Secondary | ICD-10-CM

## 2021-07-16 DIAGNOSIS — M7731 Calcaneal spur, right foot: Secondary | ICD-10-CM | POA: Diagnosis not present

## 2021-07-20 ENCOUNTER — Other Ambulatory Visit: Payer: Self-pay

## 2021-07-22 ENCOUNTER — Other Ambulatory Visit: Payer: Self-pay

## 2021-07-22 ENCOUNTER — Ambulatory Visit (INDEPENDENT_AMBULATORY_CARE_PROVIDER_SITE_OTHER): Payer: 59 | Admitting: Licensed Clinical Social Worker

## 2021-07-22 DIAGNOSIS — F4310 Post-traumatic stress disorder, unspecified: Secondary | ICD-10-CM
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
## 2021-07-28 ENCOUNTER — Other Ambulatory Visit: Payer: Self-pay

## 2021-07-28 DIAGNOSIS — H04123 Dry eye syndrome of bilateral lacrimal glands: Secondary | ICD-10-CM | POA: Diagnosis not present

## 2021-07-28 DIAGNOSIS — H01021 Squamous blepharitis right upper eyelid: Secondary | ICD-10-CM | POA: Diagnosis not present

## 2021-07-28 DIAGNOSIS — H01024 Squamous blepharitis left upper eyelid: Secondary | ICD-10-CM | POA: Diagnosis not present

## 2021-07-28 MED ORDER — XIIDRA 5 % OP SOLN
OPHTHALMIC | 4 refills | Status: DC
  Filled 2021-07-28 – 2021-10-11 (×2): qty 180, 90d supply, fill #0
  Filled 2022-03-17: qty 180, 90d supply, fill #1
  Filled 2022-07-15: qty 180, 90d supply, fill #2

## 2021-07-30 ENCOUNTER — Other Ambulatory Visit: Payer: Self-pay

## 2021-08-02 ENCOUNTER — Ambulatory Visit: Payer: 59 | Admitting: Psychiatry

## 2021-08-04 ENCOUNTER — Ambulatory Visit (INDEPENDENT_AMBULATORY_CARE_PROVIDER_SITE_OTHER): Payer: 59 | Admitting: Licensed Clinical Social Worker

## 2021-08-04 ENCOUNTER — Other Ambulatory Visit: Payer: Self-pay

## 2021-08-04 DIAGNOSIS — F4310 Post-traumatic stress disorder, unspecified: Secondary | ICD-10-CM | POA: Diagnosis not present

## 2021-08-05 NOTE — Progress Notes (Signed)
   THERAPIST PROGRESS NOTE  Session Time: 4-5p  Participation Level: Active  Behavioral Response: Neat and Well GroomedAlertAnxious and Depressed  Type of Therapy: Individual Therapy  Treatment Goals addressed: Anxiety and Diagnosis: depression  Interventions: CBT and Other: trauma focused  Summary: Nichole Brown is a 55 y.o. female who presents with continuing symptoms related to PTSD diagnosis. Pt reporting that overall mood is stable and triggered by situational events. Pt feels she is managing stress and anxiety well. Allowed pt safe space to explore thoughts and feelings associated with recent life events/stressors. Pt reports that she is continuing to be stressed over relationship with her daughter. Pt reports that she is having to set significant boundaries with daughter, which is helping pt feel more empowered. Reviewed coping skills for managing stress, depression, anxiety.Continued recommendations are as follows: self care behaviors, positive social engagements, focusing on overall work/home/life balance, and focusing on positive physical and emotional wellness.    Suicidal/Homicidal: No  Therapist Response: [name] Is doing a great job of identifying specific situations that trigger anxiety, and utilizing anxiety management strategies in the moment. Patient is able to verbalize and understanding of how thoughts, feelings, and behaviors contribute to overall anxiety and treatment of anxiety. Patient is able to discuss traumas from the past without becoming overly emotional. These behaviors are reflective of continuing personal growth and progress. Treatment to continue as indicated.  Plan: Return again in 4 weeks.  Diagnosis: Axis I: Post Traumatic Stress Disorder    Axis II: No diagnosis    [place], LCSW 08/05/2021

## 2021-08-09 ENCOUNTER — Ambulatory Visit: Payer: 59 | Admitting: Licensed Clinical Social Worker

## 2021-08-10 ENCOUNTER — Other Ambulatory Visit: Payer: Self-pay

## 2021-08-24 ENCOUNTER — Other Ambulatory Visit: Payer: Self-pay

## 2021-08-24 DIAGNOSIS — M722 Plantar fascial fibromatosis: Secondary | ICD-10-CM | POA: Diagnosis not present

## 2021-08-24 MED ORDER — ETODOLAC 500 MG PO TABS
ORAL_TABLET | ORAL | 1 refills | Status: DC
  Filled 2021-08-24: qty 60, 30d supply, fill #0

## 2021-08-25 ENCOUNTER — Other Ambulatory Visit: Payer: Self-pay

## 2021-08-25 ENCOUNTER — Ambulatory Visit (INDEPENDENT_AMBULATORY_CARE_PROVIDER_SITE_OTHER): Payer: 59 | Admitting: Licensed Clinical Social Worker

## 2021-08-25 DIAGNOSIS — F4310 Post-traumatic stress disorder, unspecified: Secondary | ICD-10-CM | POA: Diagnosis not present

## 2021-08-25 NOTE — Progress Notes (Signed)
   THERAPIST PROGRESS NOTE  Session Time: 4-5p  Participation Level: Active  Behavioral Response: Neat and Well GroomedAlertAnxious  Type of Therapy: Individual Therapy  Treatment Goals addressed: Coping  Interventions: Reframing  Summary: Nichole Brown is a 55 y.o. female who presents with improving symptoms related to PTSD diagnosis. Patient reports that overall mood is stable and that she feels she is managing stress and anxiety well. Patient reports improving quality and quantity of sleep.  Allow patients a space to explore and express thoughts and feelings associated with recent external stresses in life events. Allow patient to explore relationship between her and daughter, and recent boundaries that patient is setting for her own safety. Patient reports that setting the boundaries have allowed patient to focus on self-care and increased overall self-awareness. Patient discussed reading book, "Boundaries" that was required by her church, and patient feels that it has been life changing. Encouraged patient to share relatable information patient has found in the book. Discussed lifelong traumas, and overall impact.  Allow patient to identify anxiety triggers, fears, and phobias. Allowed patient space to reframe and identify irrational fears and replace with more rational cognitive explanations. Patient reports that each time she does this it makes sense and makes her feel more at ease.  Continued recommendations are as follows: self care behaviors, positive social engagements, focusing on overall work/home/life balance, and focusing on positive physical and emotional wellness. .   Suicidal/Homicidal: No  Therapist Response: [name] doing a great job of identifying specific situations that trigger anxiety, and utilizing anxiety management strategies in the moment. Patient is able to verbalize and understanding of how thoughts, feelings, and behaviors contribute to overall anxiety and  treatment of anxiety. Patient is able to discuss traumas from the past without becoming overly emotional. These behaviors are reflective of continuing personal growth and progress. Treatment to continue as indicated.  Plan: Return again in 4 weeks.  Diagnosis: Axis I: Post Traumatic Stress Disorder    Axis II: No diagnosis    Rachel Bo [place]za [place], LCSW 08/25/2021

## 2021-09-14 DIAGNOSIS — M722 Plantar fascial fibromatosis: Secondary | ICD-10-CM | POA: Diagnosis not present

## 2021-09-15 ENCOUNTER — Ambulatory Visit (INDEPENDENT_AMBULATORY_CARE_PROVIDER_SITE_OTHER): Payer: 59 | Admitting: Licensed Clinical Social Worker

## 2021-09-15 ENCOUNTER — Other Ambulatory Visit: Payer: Self-pay

## 2021-09-15 DIAGNOSIS — F4310 Post-traumatic stress disorder, unspecified: Secondary | ICD-10-CM | POA: Diagnosis not present

## 2021-09-15 NOTE — Progress Notes (Signed)
   THERAPIST PROGRESS NOTE  Session Time: 4-5p  Participation Level: Active  Behavioral Response: Neat and Well GroomedAlertEuthymic  Type of Therapy: Individual Therapy  Treatment Goals addressed:  Problem: Reduce the negative impact trauma related symptoms have on social, occupational, and family functioning. Goal: LTG: Reduce frequency, intensity, and duration of PTSD symptoms so daily functioning is improved: Input needed on appropriate metric.  Pt self report. Outcome: Progressing  Goal: STG: Practice interpersonal effectiveness skills 7 times per week for the next 16 weeks Outcome: Progressing  Interventions:  Intervention: PROVIDE Jaquana WITH EDUCATIONAL INFORMATION AND READING MATERIAL ON PTSD, ITS CAUSES AND SYMPTOMS  Intervention: Assist with relaxation techniques, as appropriate (deep breathing exercises, meditation, guided imagery)  Intervention: Give positive reinforcement and praise   Summary: Nichole Brown is a 55 y.o. female who presents with improving symptoms related to PTSD diagnosis. Pt reports that overall mood is stable and that she is managing situational stressors better. Pt reporting good quality and quantity of sleep.  Allowed pt to explore and express thoughts and feelings associated with recent life situations and external stressors. Pt reports that changes within the workplace environment have made an improvement in overall work stress. Pt reports that she has noticed a big change recently. Explored psychoeducational book "Boundaries" and pt shared her personal book, notes, and reflections of how this book has impacted her in a positive way. Discussed ways that pt is setting limits and boundaries with family members. Pt feels empowered and feels more aware of self, wants, and needs. Praised pts progress and encouraged pt to continue reading book.  Continued recommendations are as follows: self care behaviors, positive social engagements, focusing on  overall work/home/life balance, and focusing on positive physical and emotional wellness. .   Suicidal/Homicidal: No  Therapist Response: Pt is continuing to apply interventions learned in session into daily life situations. Pt is currently on track to meet goals utilizing interventions mentioned above. Personal growth and progress noted. Treatment to continue as indicated.    Plan: Return again in 4 weeks.  Diagnosis: Axis I: PTSD    Axis II: No diagnosis    [place], LCSW 09/15/2021

## 2021-09-16 NOTE — Plan of Care (Signed)
  Problem: Reduce the negative impact trauma related symptoms have on social, occupational, and family functioning. Goal: LTG: Reduce frequency, intensity, and duration of PTSD symptoms so daily functioning is improved: Input needed on appropriate metric.  Pt self report. Outcome: Progressing Goal: STG: Practice interpersonal effectiveness skills 7 times per week for the next 16 weeks Outcome: Progressing Intervention: PROVIDE Nichole Brown WITH EDUCATIONAL INFORMATION AND READING MATERIAL ON PTSD, ITS CAUSES AND SYMPTOMS Intervention: Assist with relaxation techniques, as appropriate (deep breathing exercises, meditation, guided imagery) Intervention: Give positive reinforcement and praise

## 2021-09-24 ENCOUNTER — Encounter: Payer: Self-pay | Admitting: Emergency Medicine

## 2021-09-24 ENCOUNTER — Other Ambulatory Visit: Payer: Self-pay

## 2021-09-24 ENCOUNTER — Ambulatory Visit
Admission: EM | Admit: 2021-09-24 | Discharge: 2021-09-24 | Disposition: A | Discharge status: Home / Self Care | Payer: 59 | Attending: Emergency Medicine | Admitting: Emergency Medicine

## 2021-09-24 DIAGNOSIS — M722 Plantar fascial fibromatosis: Secondary | ICD-10-CM

## 2021-09-24 DIAGNOSIS — M79661 Pain in right lower leg: Secondary | ICD-10-CM

## 2021-09-24 MED ORDER — PREDNISONE 10 MG (21) PO TBPK: ORAL_TABLET | ORAL | 0 refills | Status: DC

## 2021-09-24 NOTE — Discharge Instructions (Addendum)
I suspect that you have low-grade shinsplints from walking abnormally because of your plantar fascial pain.  Try the prednisone.  Take 1000 mg of Tylenol with the etodolac twice a day.  You may take an additional 1000 mg of Tylenol 2 more times a day for a total of 4000 mg of Tylenol in 24 hours.  Ice.  Continue stretching.

## 2021-09-24 NOTE — ED Provider Notes (Signed)
HPI  SUBJECTIVE:  Nichole Brown is a 55 y.o. female who presents with 6 months of stabbing, sharp right sided shin pain.  She has had problems with plantar fasciitis during this time as well and has altered gait mechanics.  She had a negative DVT ultrasound 2 months ago for this shin pain.  She has seen podiatry twice and has gotten steroid shots for her plantar fasciitis.  She states that her foot feels swollen, and reports that she had some erythema, swelling in an area of her shin last night, but this has since resolved.  No calf swelling, foot color or temperature changes.  Patient states she does a lot of walking daily.  She has tried ice and stretching, is on Tylenol and etodolac twice daily.  Stretching helps.  Symptoms are worse with walking.  She has a past medical history of fibromyalgia, Planter fasciitis.  No history of diabetes, neuropathy, osteoporosis.  PMD: [facility] preferred care in [place].    Past Medical History:  Diagnosis Date   Anxiety    Bronchitis    Complication of anesthesia    PONV   Depression    Fibromyalgia 2005   GERD (gastroesophageal reflux disease)    Insomnia    Migraine headache    Pneumonia    PONV (postoperative nausea and vomiting)     Past Surgical History:  Procedure Laterality Date   ABDOMINAL HYSTERECTOMY     BALLOON DILATION N/A 12/14/2020   Procedure: BALLOON DILATION;  Surgeon: Eloise Harman, DO;  [facility];  Service: Endoscopy;  Laterality: N/A;   BIOPSY  12/14/2020   Procedure: BIOPSY;  Surgeon: Eloise Harman, DO;  [facility];  Service: Endoscopy;;   CESAREAN SECTION     CHOLECYSTECTOMY  1988   COLONOSCOPY N/A 12/10/2018    six 4-8 mm polyps in proximal descending colon, mid transverse and hepatic flexure. Diverticulosis. External and internal hemorrhoids. Torturous colon. Two simple adenomas, two serrated polyps, 2 hyperplastic polyps. Surveillance due Dec 2022.    ESOPHAGOGASTRODUODENOSCOPY (EGD)  WITH PROPOFOL N/A 12/14/2020   Benign-appearing esophageal stenosis s/p dilation, gastritis, normal duodenum. Mild chronic gastritis.    open heart surgery  1967   PATENT DUCTUS ARTERIOUS REPAIR  1967   POLYPECTOMY  12/10/2018   Procedure: POLYPECTOMY;  Surgeon: Danie Binder, MD;  [facility];  Service: Endoscopy;;  hepatic flexure, descending, transverse   SHOULDER ARTHROSCOPY Left 06/16/2017   Procedure: LEFT SHOULDER ARTHROSCOPY, DEBRIDEMENT, AND DECOMPRESSION;  Surgeon: Newt Minion, MD;  Location: [facility];  Service: Orthopedics;  Laterality: Left;   SHOULDER ARTHROSCOPY WITH SUBACROMIAL DECOMPRESSION Left 10/03/2019   Procedure: SHOULDER ARTHROSCOPY WITH DEBRIDEMENT, DECOMPRESSION;  Surgeon: Corky Mull, MD;  Location: [facility];  Service: Orthopedics;  Laterality: Left;   SHOULDER SURGERY Left 2008   AC joint    Family History  Problem Relation Age of Onset   Hypertension Maternal Grandmother    Diabetes Maternal Grandmother    Heart disease Maternal Grandmother 2       MI   Hypertension Maternal Grandfather    Throat cancer Maternal Grandfather    Breast cancer Mother 12   Hypertension Father    Colon cancer Neg Hx     Social History   Tobacco Use   Smoking status: Former   Smokeless tobacco: Never  Scientific laboratory technician Use: Never used  Substance Use Topics   Alcohol use: Yes    Comment: occassionally  Drug use: No    No current facility-administered medications for this encounter.  Current Outpatient Medications:    etodolac (LODINE) 500 MG tablet, Take 1 tablet (500 mg total) by mouth 2 (two) times daily for 30 days, Disp: 60 tablet, Rfl: 1   predniSONE (STERAPRED UNI-PAK 21 TAB) 10 MG (21) TBPK tablet, Dispense one 6 day pack. Take as directed with food., Disp: 21 tablet, Rfl: 0   albuterol (VENTOLIN HFA) 108 (90 Base) MCG/ACT inhaler, Inhale 2 puffs into the lungs 4 (four) times daily as needed for wheezing or shortness of breath., Disp: 18 g, Rfl:  2   Cholecalciferol (VITAMIN D) 50 MCG (2000 UT) CAPS, Take by mouth daily., Disp: , Rfl:    escitalopram (LEXAPRO) 10 MG tablet, 5 mg daily for one week, then 10 mg daily, Disp: 30 tablet, Rfl: 1   famotidine (PEPCID) 20 MG tablet, , Disp: , Rfl:    Lifitegrast (XIIDRA) 5 % SOLN, Instill 1 drop OU bid., Disp: 180 each, Rfl: 4   meloxicam (MOBIC) 15 MG tablet, 1 tab ORAL every day as needed, Disp: 30 tablet, Rfl: 1   ondansetron (ZOFRAN-ODT) 4 MG disintegrating tablet, Take 1 tablet (4 mg total) by mouth every 8 (eight) hours as needed for nausea or vomiting., Disp: 20 tablet, Rfl: 0   ondansetron (ZOFRAN-ODT) 4 MG disintegrating tablet, Take 1 tablet (4 mg total) by mouth every 8 (eight) hours as needed for Nausea migraine, Disp: 20 tablet, Rfl: 11   pantoprazole (PROTONIX) 40 MG tablet, Take 1 tablet (40 mg total) by mouth daily. 30 minutes before breakfast, Disp: 90 tablet, Rfl: 3   propranolol (INDERAL) 10 MG tablet, Take 1 tablet (10 mg) nightly x 3 days, increase to 1 tablet twice a day x 1 week then increase to 1 tablet in the morning and 2 tablets in the evening, Disp: 90 tablet, Rfl: 5   rizatriptan (MAXALT-MLT) 10 MG disintegrating tablet, Take 1 tablet (10 mg total) by mouth once as needed (may repeat dose once in > 2 hrs) for up to 1 dose, Disp: 10 tablet, Rfl: 11   tiZANidine (ZANAFLEX) 4 MG tablet, TAKE 1 TABLET BY MOUTH TWICE DAILY AS NEEDED FOR SPASMS, Disp: 36 tablet, Rfl: 0   topiramate (TOPAMAX) 25 MG tablet, Take 1 tablet by mouth nightly for 2 weeks then incraese to 1 tablet twice a day, Disp: 60 tablet, Rfl: 5   XIIDRA 5 % SOLN, Place 1 drop into both eyes 2 (two) times daily., Disp: , Rfl:   Allergies  Allergen Reactions   Cymbalta [Duloxetine Hcl] Other (See Comments)    Hallucinations; sleep walking   Nortriptyline Nausea And Vomiting   Phenergan [Promethazine Hcl]     Restless legs and cramping   Topiramate Other (See Comments)    Dry eyes, visual changes; advised to  stop med by her optometrist Dry eyes, visual changes; advised to stop med by her optometrist - update patient eye dr said OK to use     ROS  As noted in HPI.   Physical Exam  BP 96/63 (BP Location: Right Arm)   Pulse 73   Temp 98.1 F (36.7 C) (Oral)   Resp 17   SpO2 97%   Constitutional: Well developed, well nourished, no acute distress Eyes:  EOMI, conjunctiva normal bilaterally HENT: Normocephalic, atraumatic,mucus membranes moist Respiratory: Normal inspiratory effort Cardiovascular: Normal rate GI: nondistended skin: No rash, skin intact Musculoskeletal: Calves symmetric.  Positive tenderness over the right anterior tibialis.  Compartments soft.  No erythema, induration.  No posterior calf tenderness.  Positive tenderness posterior to the distal fibula and along the lateral right foot.  Positive tenderness over the right plantar fascia.  DP 2+.  No color changes.  Normal temperature. Neurologic: Alert & oriented x 3, no focal neuro deficits Psychiatric: Speech and behavior appropriate   ED Course   Medications - No data to display  No orders of the defined types were placed in this encounter.   No results found for this or any previous visit (from the past 24 hour(s)). No results found.  ED Clinical Impression  1. Pain in right shin   2. Plantar fasciitis of right foot      ED Assessment/Plan  patient with right Planter fasciitis and anterior tibialis pain/tenderness.  Doubt DVT.  No evidence of infection or vascular compromise.  She may have a low-grade shinsplints from the altered mechanics of walking.  Her shoes are evenly worn, but they are new shoes.   will try a 6-day steroid taper, she is to continue the etodolac, add Tylenol to it.  She will be mindful of her gait.  she has follow-up with Dr. Vickki Muff on Monday at 1330.   Discussed MDM, treatment plan, and plan for follow-up with patient. patient agrees with plan.   Meds ordered this encounter   Medications   predniSONE (STERAPRED UNI-PAK 21 TAB) 10 MG (21) TBPK tablet    Sig: Dispense one 6 day pack. Take as directed with food.    Dispense:  21 tablet    Refill:  0      *This clinic note was created using [employer]. Therefore, there may be occasional mistakes despite careful proofreading.  ?    Melynda Ripple, MD 09/25/21 1200

## 2021-09-24 NOTE — ED Triage Notes (Signed)
Patient c/o RT leg and RT foot pain x 1 year.  Patient endorses worsening pain occurred last night.   Patient is currently seeing a foot doctor. Patient continues to follow up for "cortisone" shots.   Patient attempted to use ice and exercise provided by foot doctor with no relief of symptoms.

## 2021-10-04 ENCOUNTER — Encounter: Payer: Self-pay | Admitting: Cardiology

## 2021-10-04 ENCOUNTER — Ambulatory Visit: Payer: 59 | Admitting: Cardiology

## 2021-10-04 ENCOUNTER — Other Ambulatory Visit: Payer: Self-pay

## 2021-10-04 VITALS — BP 100/68 | HR 71 | Ht 62.0 in | Wt 163.0 lb

## 2021-10-04 DIAGNOSIS — K2100 Gastro-esophageal reflux disease with esophagitis, without bleeding: Secondary | ICD-10-CM | POA: Diagnosis not present

## 2021-10-04 DIAGNOSIS — R0602 Shortness of breath: Secondary | ICD-10-CM | POA: Diagnosis not present

## 2021-10-04 NOTE — Progress Notes (Signed)
Cardiology Office Note:    Date:  10/04/2021   ID:  Nichole Brown, DOB Dec 07, 1966, MRN [id number]  PCP:  Nichole Colla, DO   [facility] Providers Cardiologist:  None     Referring MD: Nichole Colla, DO   Chief Complaint  Patient presents with   New Patient (Initial Visit)    Self referral -- Patient c.o SOB. Meds reviewed verbally with patient.     History of Present Illness:    Nichole Brown is a 55 y.o. female with a hx of anxiety, migraine, GERD presents due to shortness of breath.  Patient has a history of PFO which was repaired around age 61.  She follows up with neurology for chronic migraine symptoms.  Typically requires cardiac eval due to prior cardiac surgery before any new migraine therapy started.  Has occasional chest discomfort, relieved with taking antireflux medicines.  Also notes some shortness of breath when she walks.  Denies chest pain with exertion.  Overall she states feeling okay.  Denies palpitations, dizziness, syncope.   Echocardiogram 12/2018 EF 60 to 65%.  Exercise tolerance testing 2018 was normal.  Past Medical History:  Diagnosis Date   Anxiety    Bronchitis    Complication of anesthesia    PONV   Depression    Fibromyalgia 2005   GERD (gastroesophageal reflux disease)    Insomnia    Migraine headache    Pneumonia    PONV (postoperative nausea and vomiting)     Past Surgical History:  Procedure Laterality Date   ABDOMINAL HYSTERECTOMY     BALLOON DILATION N/A 12/14/2020   Procedure: BALLOON DILATION;  Surgeon: Eloise Harman, DO;  [facility];  Service: Endoscopy;  Laterality: N/A;   BIOPSY  12/14/2020   Procedure: BIOPSY;  Surgeon: Eloise Harman, DO;  [facility];  Service: Endoscopy;;   CESAREAN SECTION     CHOLECYSTECTOMY  1988   COLONOSCOPY N/A 12/10/2018    six 4-8 mm polyps in proximal descending colon, mid transverse and hepatic flexure. Diverticulosis. External and internal  hemorrhoids. Torturous colon. Two simple adenomas, two serrated polyps, 2 hyperplastic polyps. Surveillance due Dec 2022.    ESOPHAGOGASTRODUODENOSCOPY (EGD) WITH PROPOFOL N/A 12/14/2020   Benign-appearing esophageal stenosis s/p dilation, gastritis, normal duodenum. Mild chronic gastritis.    open heart surgery  1967   PATENT DUCTUS ARTERIOUS REPAIR  1967   POLYPECTOMY  12/10/2018   Procedure: POLYPECTOMY;  Surgeon: Danie Binder, MD;  [facility];  Service: Endoscopy;;  hepatic flexure, descending, transverse   SHOULDER ARTHROSCOPY Left 06/16/2017   Procedure: LEFT SHOULDER ARTHROSCOPY, DEBRIDEMENT, AND DECOMPRESSION;  Surgeon: Newt Minion, MD;  Location: [place];  Service: Orthopedics;  Laterality: Left;   SHOULDER ARTHROSCOPY WITH SUBACROMIAL DECOMPRESSION Left 10/03/2019   Procedure: SHOULDER ARTHROSCOPY WITH DEBRIDEMENT, DECOMPRESSION;  Surgeon: Corky Mull, MD;  Location: [facility];  Service: Orthopedics;  Laterality: Left;   SHOULDER SURGERY Left 2008   AC joint    Current Medications: Current Meds  Medication Sig   albuterol (VENTOLIN HFA) 108 (90 Base) MCG/ACT inhaler Inhale 2 puffs into the lungs 4 (four) times daily as needed for wheezing or shortness of breath.   Cholecalciferol (VITAMIN D) 50 MCG (2000 UT) CAPS Take by mouth daily.   etodolac (LODINE) 500 MG tablet Take 1 tablet (500 mg total) by mouth 2 (two) times daily for 30 days   famotidine (PEPCID) 20 MG tablet  Lifitegrast (XIIDRA) 5 % SOLN Instill 1 drop OU bid.   ondansetron (ZOFRAN-ODT) 4 MG disintegrating tablet Take 1 tablet (4 mg total) by mouth every 8 (eight) hours as needed for nausea or vomiting.   ondansetron (ZOFRAN-ODT) 4 MG disintegrating tablet Take 1 tablet (4 mg total) by mouth every 8 (eight) hours as needed for Nausea migraine   rizatriptan (MAXALT-MLT) 10 MG disintegrating tablet Take 1 tablet (10 mg total) by mouth once as needed (may repeat dose once in > 2 hrs) for up to 1 dose    XIIDRA 5 % SOLN Place 1 drop into both eyes 2 (two) times daily.     Allergies:   Cymbalta [duloxetine hcl], Nortriptyline, Phenergan [promethazine hcl], and Topiramate   Social History   Socioeconomic History   Marital status: Married    Spouse name: Nichole Brown   Number of children: 4   Years of education: Not on file   Highest education level: Some college, no degree  Occupational History   Not on file  Tobacco Use   Smoking status: Former   Smokeless tobacco: Never  Scientific laboratory technician Use: Never used  Substance and Sexual Activity   Alcohol use: Yes    Comment: occassionally   Drug use: No   Sexual activity: Yes    Birth control/protection: Surgical  Other Topics Concern   Not on file  Social History Narrative   Not on file   Social Determinants of Health   Financial Resource Strain: Not on file  Food Insecurity: Not on file  Transportation Needs: Not on file  Physical Activity: Not on file  Stress: Not on file  Social Connections: Not on file     Family History: The patient's family history includes Breast cancer (age of onset: 51) in her mother; Diabetes in her maternal grandmother; Heart disease (age of onset: 85) in her maternal grandmother; Hypertension in her father, maternal grandfather, and maternal grandmother; Throat cancer in her maternal grandfather. There is no history of Colon cancer.  ROS:   Please see the history of present illness.     All other systems reviewed and are negative.  EKGs/Labs/Other Studies Reviewed:    The following studies were reviewed today:   EKG:  EKG is  ordered today.  The ekg ordered today demonstrates normal sinus rhythm, normal ECG.  Recent Labs: 10/07/2020: ALT 24; BUN 14; Creatinine, Ser 0.71; Hemoglobin 12.7; Platelets 264; Potassium 4.0; Sodium 138  Recent Lipid Panel    Component Value Date/Time   CHOL 162 07/23/2019 0815   CHOL 176 09/22/2014 0615   TRIG 122 07/23/2019 0815   TRIG 242 (H) 09/22/2014 0615    HDL 45 07/23/2019 0815   HDL 46 09/22/2014 0615   CHOLHDL 3.6 07/23/2019 0815   VLDL 24 07/23/2019 0815   VLDL 48 (H) 09/22/2014 0615   LDLCALC 93 07/23/2019 0815   LDLCALC 82 09/22/2014 0615     Risk Assessment/Calculations:          Physical Exam:    VS:  BP 100/68 (BP Location: Left Arm, Patient Position: Sitting, Cuff Size: Normal)   Pulse 71   Ht 5\' 2"  (1.575 m)   Wt 163 lb (73.9 kg)   SpO2 95%   BMI 29.81 kg/m     Wt Readings from Last 3 Encounters:  10/04/21 163 lb (73.9 kg)  05/04/21 166 lb 6.4 oz (75.5 kg)  03/09/21 164 lb (74.4 kg)     GEN:  Well nourished, well  developed in no acute distress HEENT: Normal NECK: No JVD; No carotid bruits LYMPHATICS: No lymphadenopathy CARDIAC: RRR, no murmurs, rubs, gallops RESPIRATORY:  Clear to auscultation without rales, wheezing or rhonchi  ABDOMEN: Soft, non-tender, non-distended MUSCULOSKELETAL:  No edema; No deformity  SKIN: Warm and dry NEUROLOGIC:  Alert and oriented x 3 PSYCHIATRIC:  Normal affect   ASSESSMENT:    1. Shortness of breath   2. Gastroesophageal reflux disease with esophagitis without hemorrhage    PLAN:    In order of problems listed above:  Shortness of breath, sometimes with walking.  Symptoms not consistent with angina.  She denies chest pain.  Echo 2020 showed preserved EF.  Likely from deconditioning.  Prior stress testing was normal.  Patient advised to increase exercise tolerance.  She can proceed with starting therapy for migraine management from a cardiac perspective. Reflux, well controlled with Pepcid.  Continue.  Follow-up as needed     Medication Adjustments/Labs and Tests Ordered: Current medicines are reviewed at length with the patient today.  Concerns regarding medicines are outlined above.  No orders of the defined types were placed in this encounter.  No orders of the defined types were placed in this encounter.   There are no Patient Instructions on file for this  visit.   Signed, Kate Sable, MD  10/04/2021 4:30 PM    [place]

## 2021-10-11 ENCOUNTER — Other Ambulatory Visit: Payer: Self-pay

## 2021-10-12 ENCOUNTER — Other Ambulatory Visit: Payer: Self-pay

## 2021-10-12 DIAGNOSIS — M542 Cervicalgia: Secondary | ICD-10-CM | POA: Diagnosis not present

## 2021-10-12 DIAGNOSIS — M722 Plantar fascial fibromatosis: Secondary | ICD-10-CM | POA: Diagnosis not present

## 2021-10-12 DIAGNOSIS — M79671 Pain in right foot: Secondary | ICD-10-CM | POA: Diagnosis not present

## 2021-10-12 DIAGNOSIS — G43709 Chronic migraine without aura, not intractable, without status migrainosus: Secondary | ICD-10-CM | POA: Diagnosis not present

## 2021-10-12 DIAGNOSIS — M7661 Achilles tendinitis, right leg: Secondary | ICD-10-CM | POA: Diagnosis not present

## 2021-10-12 MED ORDER — RIZATRIPTAN BENZOATE 10 MG PO TBDP
ORAL_TABLET | ORAL | 11 refills | Status: DC
  Filled 2022-07-15: qty 10, 18d supply, fill #0

## 2021-10-12 MED ORDER — ONDANSETRON 4 MG PO TBDP
ORAL_TABLET | ORAL | 11 refills | Status: DC
  Filled 2022-07-15: qty 20, 6d supply, fill #0

## 2021-10-12 MED ORDER — TOPIRAMATE 25 MG PO TABS
25.0000 mg | ORAL_TABLET | Freq: Two times a day (BID) | ORAL | 11 refills | Status: DC
  Filled 2021-10-12: qty 60, 30d supply, fill #0
  Filled 2021-12-31: qty 60, 30d supply, fill #1
  Filled 2022-03-17: qty 60, 30d supply, fill #2
  Filled 2022-05-26: qty 60, 30d supply, fill #3
  Filled 2022-07-15: qty 60, 30d supply, fill #4
  Filled 2022-08-08: qty 60, 30d supply, fill #5

## 2021-10-13 ENCOUNTER — Other Ambulatory Visit: Payer: Self-pay

## 2021-10-13 ENCOUNTER — Ambulatory Visit (INDEPENDENT_AMBULATORY_CARE_PROVIDER_SITE_OTHER): Payer: 59 | Admitting: Licensed Clinical Social Worker

## 2021-10-13 DIAGNOSIS — F4310 Post-traumatic stress disorder, unspecified: Secondary | ICD-10-CM

## 2021-10-14 NOTE — Progress Notes (Signed)
   THERAPIST PROGRESS NOTE  Session Time: 4-5p  Participation Level: Active  Behavioral Response: Neat and Well GroomedAlertAnxious  Type of Therapy: Individual Therapy  Treatment Goals addressed:  Goal: LTG: Reduce frequency, intensity, and duration of PTSD symptoms so daily functioning is improved: Input needed on appropriate metric.  Pt self report. Outcome: Progressing Goal: STG: Practice interpersonal effectiveness skills 7 times per week for the next 16 weeks Outcome: Progressing Interventions:  Intervention: Assess history of psychiatric trauma Intervention: WORK WITH [name] TO IDENTIFY THE MAJOR COMPONENTS OF A RECENT EPISODE OF DEPRESSION: PHYSICAL SYMPTOMS, MAJOR THOUGHTS AND IMAGES, AND MAJOR BEHAVIORS THEY EXPERIENCED Intervention: Work with patient to identify the major components of a recent episode of anxiety: physical symptoms, major thoughts and images, and major behaviors they experienced Intervention: Assess emotional status and coping mechanisms Intervention: Assist with relaxation techniques, as appropriate (deep breathing exercises, meditation, guided imagery)   Summary: Nichole Brown is a 55 y.o. female who presents with improving symptoms related to PTSD diagnosis. Pt reports that overall mood is stable and that she is managing stress and anxiety well. Pt reports good quality and quantity of sleep.  Allowed pt to explore and express thoughts and feelings associated with recent life situations and external stressors. Explored pts thoughts and feelings about sudden death of son and overall psychological impact. Pt feels that she is progressing because she is able to articulate and label her feelings, and has felt really numb about it in the past. Pt is continuing to read books about trauma and setting boundaries, and feels that managing and controlling her life is very liberating. Pt feels that she is improving in relationships with others because she is happier due  to setting boundaries. Praised pts progress and encouraged pt to continue seeing educational materials that are helpful to her.  Continued recommendations are as follows: self care behaviors, positive social engagements, focusing on overall work/home/life balance, and focusing on positive physical and emotional wellness.  .   Suicidal/Homicidal: No  Therapist Response: Pt is continuing to apply interventions learned in session into daily life situations. Pt is currently on track to meet goals utilizing interventions mentioned above. Personal growth and progress noted. Treatment to continue as indicated.   Plan: Return again in 4 weeks.  Diagnosis: Axis I: PTSD    Axis II: No diagnosis    Rachel Bo Bracken Moffa, LCSW 10/14/2021

## 2021-10-16 NOTE — Plan of Care (Signed)
  Problem: Reduce the negative impact trauma related symptoms have on social, occupational, and family functioning. Goal: LTG: Reduce frequency, intensity, and duration of PTSD symptoms so daily functioning is improved: Input needed on appropriate metric.  Pt self report. Outcome: Progressing Goal: STG: Practice interpersonal effectiveness skills 7 times per week for the next 16 weeks Outcome: Progressing Intervention: Assess history of psychiatric trauma Intervention: WORK WITH Alaze TO IDENTIFY THE MAJOR COMPONENTS OF A RECENT EPISODE OF DEPRESSION: PHYSICAL SYMPTOMS, MAJOR THOUGHTS AND IMAGES, AND MAJOR BEHAVIORS THEY EXPERIENCED Intervention: Work with patient to identify the major components of a recent episode of anxiety: physical symptoms, major thoughts and images, and major behaviors they experienced Intervention: Assess emotional status and coping mechanisms Intervention: Assist with relaxation techniques, as appropriate (deep breathing exercises, meditation, guided imagery)

## 2021-10-19 ENCOUNTER — Ambulatory Visit: Payer: 59 | Admitting: Gastroenterology

## 2021-10-26 DIAGNOSIS — R7303 Prediabetes: Secondary | ICD-10-CM | POA: Diagnosis not present

## 2021-10-26 DIAGNOSIS — R252 Cramp and spasm: Secondary | ICD-10-CM | POA: Diagnosis not present

## 2021-10-26 DIAGNOSIS — L298 Other pruritus: Secondary | ICD-10-CM | POA: Diagnosis not present

## 2021-10-27 ENCOUNTER — Other Ambulatory Visit: Payer: Self-pay

## 2021-10-27 ENCOUNTER — Ambulatory Visit (INDEPENDENT_AMBULATORY_CARE_PROVIDER_SITE_OTHER): Payer: 59 | Admitting: Licensed Clinical Social Worker

## 2021-10-27 DIAGNOSIS — F4310 Post-traumatic stress disorder, unspecified: Secondary | ICD-10-CM

## 2021-10-27 MED ORDER — NEOMYCIN-POLYMYXIN-HC 1 % OT SOLN
OTIC | 0 refills | Status: DC
  Filled 2021-10-27: qty 10, 7d supply, fill #0

## 2021-10-27 NOTE — Progress Notes (Signed)
   THERAPIST PROGRESS NOTE  Session Time: 3-4p  Participation Level: Active  Behavioral Response: Neat and Well GroomedAlertAnxious  Type of Therapy: Individual Therapy  Treatment Goals addressed:  Problem: Reduce the negative impact trauma related symptoms have on social, occupational, and family functioning. Goal: LTG: Reduce frequency, intensity, and duration of PTSD symptoms so daily functioning is improved: Input needed on appropriate metric.  Pt self report. Outcome: Progressing Goal: STG: Practice interpersonal effectiveness skills 7 times per week for the next 16 weeks Outcome: Progressing Interventions:  Intervention: Work with patient to identify the major components of a recent episode of anxiety: physical symptoms, major thoughts and images, and major behaviors they experienced Intervention: Educate patient on: Stress management Intervention: Assist with relaxation techniques, as appropriate (deep breathing exercises, meditation, guided imagery)   Summary: Nichole Brown is a 55 y.o. female who presents with improving symptoms related to PTSD/anxiety.  Pt reports that overall mood has been stable and that she is managing situational stressors well.   Allowed pt to explore and express thoughts and feelings associated with recent life situations and external stressors. Discussed overall stress associated with upcoming holidays, ability to wind down/rest to keep life in balance.   Reviewed some journaling that pt feels has been a "breakthrough" about accepting her son's death. Allowed pt to read journal entries and spent some time reflecting inner meaning.  Continued recommendations are as follows: self care behaviors, positive social engagements, focusing on overall work/home/life balance, and focusing on positive physical and emotional wellness.   Suicidal/Homicidal: No  Therapist Response: Pt is continuing to apply interventions learned in session into daily life  situations. Pt is currently on track to meet goals utilizing interventions mentioned above. Personal growth and progress noted. Treatment to continue as indicated.   Plan: Return again in 4 weeks.  Diagnosis: Axis I: PTSD    Axis II: No diagnosis    Rachel Bo Tennis Mckinnon, LCSW 10/27/2021

## 2021-10-28 NOTE — Plan of Care (Signed)
  Problem: Reduce the negative impact trauma related symptoms have on social, occupational, and family functioning. Goal: LTG: Reduce frequency, intensity, and duration of PTSD symptoms so daily functioning is improved: Input needed on appropriate metric.  Pt self report. Outcome: Progressing Goal: STG: Practice interpersonal effectiveness skills 7 times per week for the next 16 weeks Outcome: Progressing Intervention: Work with patient to identify the major components of a recent episode of anxiety: physical symptoms, major thoughts and images, and major behaviors they experienced Intervention: Educate patient on: Stress management Intervention: Assist with relaxation techniques, as appropriate (deep breathing exercises, meditation, guided imagery)

## 2021-10-29 ENCOUNTER — Emergency Department: Payer: 59

## 2021-10-29 ENCOUNTER — Other Ambulatory Visit: Payer: Self-pay

## 2021-10-29 ENCOUNTER — Emergency Department
Admission: EM | Admit: 2021-10-29 | Discharge: 2021-10-29 | Disposition: A | Discharge status: Home / Self Care | Payer: 59 | Attending: Emergency Medicine | Admitting: Emergency Medicine

## 2021-10-29 ENCOUNTER — Encounter: Payer: Self-pay | Admitting: Emergency Medicine

## 2021-10-29 DIAGNOSIS — R42 Dizziness and giddiness: Secondary | ICD-10-CM | POA: Diagnosis not present

## 2021-10-29 DIAGNOSIS — M79662 Pain in left lower leg: Secondary | ICD-10-CM | POA: Diagnosis not present

## 2021-10-29 DIAGNOSIS — Z20822 Contact with and (suspected) exposure to covid-19: Secondary | ICD-10-CM | POA: Diagnosis not present

## 2021-10-29 DIAGNOSIS — M79604 Pain in right leg: Secondary | ICD-10-CM

## 2021-10-29 DIAGNOSIS — R0789 Other chest pain: Secondary | ICD-10-CM | POA: Diagnosis not present

## 2021-10-29 DIAGNOSIS — R202 Paresthesia of skin: Secondary | ICD-10-CM | POA: Diagnosis not present

## 2021-10-29 DIAGNOSIS — M79661 Pain in right lower leg: Secondary | ICD-10-CM | POA: Diagnosis not present

## 2021-10-29 DIAGNOSIS — R0602 Shortness of breath: Secondary | ICD-10-CM | POA: Diagnosis not present

## 2021-10-29 DIAGNOSIS — Z87891 Personal history of nicotine dependence: Secondary | ICD-10-CM | POA: Diagnosis not present

## 2021-10-29 LAB — BASIC METABOLIC PANEL
Anion gap: 7 (ref 5–15)
BUN: 14 mg/dL (ref 6–20)
CO2: 23 mmol/L (ref 22–32)
Calcium: 9.2 mg/dL (ref 8.9–10.3)
Chloride: 106 mmol/L (ref 98–111)
Creatinine, Ser: 0.62 mg/dL (ref 0.44–1.00)
GFR, Estimated: >60 mL/min (ref >60)
Glucose, Bld: 127 mg/dL — ABNORMAL HIGH (ref 70–99)
Potassium: 3.7 mmol/L (ref 3.5–5.1)
Sodium: 136 mmol/L (ref 135–145)

## 2021-10-29 LAB — HEPATIC FUNCTION PANEL
ALT: 25 U/L (ref 0–44)
AST: 20 U/L (ref 15–41)
Albumin: 4.1 g/dL (ref 3.5–5.0)
Alkaline Phosphatase: 75 U/L (ref 38–126)
Bilirubin, Direct: <0.1 mg/dL (ref 0.0–0.2)
Total Bilirubin: 0.5 mg/dL (ref 0.3–1.2)
Total Protein: 7.1 g/dL (ref 6.5–8.1)

## 2021-10-29 LAB — URINALYSIS, ROUTINE W REFLEX MICROSCOPIC
Bilirubin Urine: NEGATIVE
Glucose, UA: NEGATIVE mg/dL
Ketones, ur: NEGATIVE mg/dL
Leukocytes,Ua: NEGATIVE
Nitrite: NEGATIVE
Protein, ur: NEGATIVE mg/dL
Specific Gravity, Urine: 1.008 (ref 1.005–1.030)
pH: 5 (ref 5.0–8.0)

## 2021-10-29 LAB — MAGNESIUM: Magnesium: 2.3 mg/dL (ref 1.7–2.4)

## 2021-10-29 LAB — CBC
HCT: 41 % (ref 36.0–46.0)
Hemoglobin: 13.9 g/dL (ref 12.0–15.0)
MCH: 30 pg (ref 26.0–34.0)
MCHC: 33.9 g/dL (ref 30.0–36.0)
MCV: 88.6 fL (ref 80.0–100.0)
Platelets: 310 10*3/uL (ref 150–400)
RBC: 4.63 MIL/uL (ref 3.87–5.11)
RDW: 13.2 % (ref 11.5–15.5)
WBC: 8.4 10*3/uL (ref 4.0–10.5)
nRBC: 0 % (ref 0.0–0.2)

## 2021-10-29 LAB — RESP PANEL BY RT-PCR (FLU A&B, COVID) ARPGX2
Influenza A by PCR: NEGATIVE
Influenza B by PCR: NEGATIVE
SARS Coronavirus 2 by RT PCR: NEGATIVE

## 2021-10-29 LAB — TSH: TSH: 2.206 u[IU]/mL (ref 0.350–4.500)

## 2021-10-29 LAB — T4, FREE: Free T4: 0.91 ng/dL (ref 0.61–1.12)

## 2021-10-29 LAB — TROPONIN I (HIGH SENSITIVITY): Troponin I (High Sensitivity): 3 ng/L (ref <18)

## 2021-10-29 LAB — BRAIN NATRIURETIC PEPTIDE: B Natriuretic Peptide: 8.2 pg/mL (ref 0.0–100.0)

## 2021-10-29 NOTE — ED Triage Notes (Signed)
Pt comes into the ED via POV c/o bilateral lower leg pain.  Pt states the pain radiates from her achilles up into her legs.  Pt denies any swelling to the legs.  Pt has been getting treated for plantar fasciitis.  Pt states the pain is cramping and it will go all the way up to her thigh.  PT also states that she has episodes of dizziness and SHOB.

## 2021-10-29 NOTE — ED Provider Notes (Signed)
[facility] Emergency Department Provider Note  ____________________________________________   Event Date/Time   First MD Initiated Contact with Patient 10/29/21 1033     (approximate)  I have reviewed the triage vital signs and the nursing notes.   HISTORY  Chief Complaint Leg Pain, Shortness of Breath, and Dizziness    HPI Nichole Brown is a 55 y.o. female with history of PFO requiring repair at 1 years who comes in with continued leg pain.  Patient reports having multiple months of tingling sensation in her right leg.  From the knee down.  Has not been progressing but not getting better.  She is been seen by podiatry and is being treated for plantar fasciitis.  She denies any swelling of the leg but states that sometimes it will feel cold and although there is no change in the color of her foot she then starts to feel short of breath and a little dizzy.  This started 2 days ago, nothing makes it better or worse.  She denies any history of blood clots, recent long travel, recent surgery, being on any kind of hormonal medications.  She states that she was started on magnesium by her primary care doctor for it.  She also reports some tingling sensation on the left shin now as well.  Denies any alcohol use, high risk sexual behaviors.  Reports being with 1 partner for over 20 years, reports a normal diet.  Patient reports that she was told to come in by her primary care doctor to make sure she is not having blood clots.       Past Medical History:  Diagnosis Date   Anxiety    Bronchitis    Complication of anesthesia    PONV   Depression    Fibromyalgia 2005   GERD (gastroesophageal reflux disease)    Insomnia    Migraine headache    Pneumonia    PONV (postoperative nausea and vomiting)     Patient Active Problem List   Diagnosis Date Noted   Non-recurrent acute suppurative otitis media of right ear without spontaneous rupture of tympanic membrane  03/09/2021   Sore throat 03/09/2021   Dysphagia 11/17/2020   Rhinosinusitis 10/20/2020   Night muscle spasms 10/20/2020   Depression, major, single episode, moderate ([place]) 01/01/2019   Generalized anxiety disorder 01/01/2019   Chest pain 12/24/2018   Wellness examination    Taking multiple medications for chronic disease 07/30/2018   Impingement syndrome of left shoulder    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   Gastroesophageal reflux disease 12/26/2016   Grief at loss of child 11/09/2016   Sternal fracture 10/06/2015   Contusion of leg 10/06/2015   Migraine headache 07/24/2013   Insomnia 05/30/2013   Fibromyalgia 05/30/2013    Past Surgical History:  Procedure Laterality Date   ABDOMINAL HYSTERECTOMY     BALLOON DILATION N/A 12/14/2020   Procedure: BALLOON DILATION;  Surgeon: Eloise Harman, DO;  [facility];  Service: Endoscopy;  Laterality: N/A;   BIOPSY  12/14/2020   Procedure: BIOPSY;  Surgeon: Eloise Harman, DO;  [facility];  Service: Endoscopy;;   CESAREAN SECTION     CHOLECYSTECTOMY  1988   COLONOSCOPY N/A 12/10/2018    six 4-8 mm polyps in proximal descending colon, mid transverse and hepatic flexure. Diverticulosis. External and internal hemorrhoids. Torturous colon. Two simple adenomas, two serrated polyps, 2 hyperplastic polyps. Surveillance due Dec 2022.    ESOPHAGOGASTRODUODENOSCOPY (EGD) WITH  PROPOFOL N/A 12/14/2020   Benign-appearing esophageal stenosis s/p dilation, gastritis, normal duodenum. Mild chronic gastritis.    open heart surgery  1967   PATENT DUCTUS ARTERIOUS REPAIR  1967   POLYPECTOMY  12/10/2018   Procedure: POLYPECTOMY;  Surgeon: Danie Binder, MD;  [facility];  Service: Endoscopy;;  hepatic flexure, descending, transverse   SHOULDER ARTHROSCOPY Left 06/16/2017   Procedure: LEFT SHOULDER ARTHROSCOPY, DEBRIDEMENT, AND DECOMPRESSION;  Surgeon: Newt Minion, MD;  Location: [place];  Service:  Orthopedics;  Laterality: Left;   SHOULDER ARTHROSCOPY WITH SUBACROMIAL DECOMPRESSION Left 10/03/2019   Procedure: SHOULDER ARTHROSCOPY WITH DEBRIDEMENT, DECOMPRESSION;  Surgeon: Corky Mull, MD;  Location: [facility];  Service: Orthopedics;  Laterality: Left;   SHOULDER SURGERY Left 2008   AC joint    Prior to Admission medications   Medication Sig Start Date End Date Taking? Authorizing Provider  albuterol (VENTOLIN HFA) 108 (90 Base) MCG/ACT inhaler Inhale 2 puffs into the lungs 4 (four) times daily as needed for wheezing or shortness of breath. 07/24/20   Elvia Collum M, DO  Cholecalciferol (VITAMIN D) 50 MCG (2000 UT) CAPS Take by mouth daily.    [provider]  etodolac (LODINE) 500 MG tablet Take 1 tablet (500 mg total) by mouth 2 (two) times daily for 30 days 08/24/21     famotidine (PEPCID) 20 MG tablet  10/06/20   [provider]  Lifitegrast Shirley Friar) 5 % SOLN Instill 1 drop OU bid. 07/28/21     NEOMYCIN-POLYMYXIN-HYDROCORTISONE (CORTISPORIN) 1 % SOLN OTIC solution PLACE 4 DROPS TO THE AFFECTED EAR/EARS 3 TIMES A DAY FOR 7 DAYS. 10/26/21     ondansetron (ZOFRAN-ODT) 4 MG disintegrating tablet Take 1 tablet (4 mg total) by mouth every 8 (eight) hours as needed for nausea or vomiting. 06/12/17   Cuthriell, Charline Bills, PA-C  ondansetron (ZOFRAN-ODT) 4 MG disintegrating tablet Take 1 tablet (4 mg total) by mouth every 8 (eight) hours as needed for Nausea migraine 06/04/21     ondansetron (ZOFRAN-ODT) 4 MG disintegrating tablet Take 1 tablet (4 mg total) by mouth every 8 (eight) hours as needed for Nausea migraine 10/12/21     rizatriptan (MAXALT-MLT) 10 MG disintegrating tablet Take 1 tablet (10 mg total) by mouth once as needed (may repeat dose once in > 2 hrs) for up to 1 dose 06/04/21     rizatriptan (MAXALT-MLT) 10 MG disintegrating tablet May take a second dose after 2 hours if needed. 10/12/21     topiramate (TOPAMAX) 25 MG tablet Take 1 tablet (25 mg total) by mouth 2  (two) times daily 10/12/21     XIIDRA 5 % SOLN Place 1 drop into both eyes 2 (two) times daily. 07/21/20   [provider]  sucralfate (CARAFATE) 1 GM/10ML suspension Take 10 mLs (1 g total) by mouth 4 (four) times daily -  with meals and at bedtime. Patient not taking: Reported on 12/07/2020 10/06/20 12/14/20  Chalmers Guest, NP    Allergies Cymbalta [duloxetine hcl], Nortriptyline, Phenergan [promethazine hcl], and Topiramate  Family History  Problem Relation Age of Onset   Hypertension Maternal Grandmother    Diabetes Maternal Grandmother    Heart disease Maternal Grandmother 31       MI   Hypertension Maternal Grandfather    Throat cancer Maternal Grandfather    Breast cancer Mother 64   Hypertension Father    Colon cancer Neg Hx     Social History Social History   Tobacco  Use   Smoking status: Former   Smokeless tobacco: Never  Scientific laboratory technician Use: Never used  Substance Use Topics   Alcohol use: Yes    Comment: occassionally   Drug use: No      Review of Systems Constitutional: No fever/chills Eyes: No visual changes. ENT: No sore throat. Cardiovascular: Denies chest pain. Respiratory: Shortness of breath Gastrointestinal: No abdominal pain.  No nausea, no vomiting.  No diarrhea.  No constipation. Genitourinary: Negative for dysuria. Musculoskeletal: Leg discomfort Skin: Negative for rash. Neurological: Negative for headaches, focal weakness or numbness.  Dizzy All other ROS negative ____________________________________________   PHYSICAL EXAM:  VITAL SIGNS: ED Triage Vitals  Enc Vitals Group     BP 10/29/21 1023 124/80     Pulse Rate 10/29/21 1023 77     Resp 10/29/21 1023 20     Temp 10/29/21 1023 98.3 F (36.8 C)     Temp Source 10/29/21 1023 Oral     SpO2 10/29/21 1023 97 %     Weight 10/29/21 1028 162 lb 14.7 oz (73.9 kg)     Height 10/29/21 1028 5\' 2"  (1.575 m)     Head Circumference --      Peak Flow --      Pain Score 10/29/21  1028 8     Pain Loc --      Pain Edu? --      Excl. in [place]? --     Constitutional: Alert and oriented. Well appearing and in no acute distress. Eyes: Conjunctivae are normal. EOMI. Head: Atraumatic. Nose: No congestion/rhinnorhea. Mouth/Throat: Mucous membranes are moist.   Neck: No stridor. Trachea Midline. FROM Cardiovascular: Normal rate, regular rhythm. Grossly normal heart sounds.  Good peripheral circulation. Respiratory: Normal respiratory effort.  No retractions. Lungs CTAB. Gastrointestinal: Soft and nontender. No distention. No abdominal bruits.  Musculoskeletal: No lower extremity tenderness nor edema.  No joint effusions.  No appreciable swelling noted.  2+ distal pulses.  Feet are warm well perfused.  Small bruise noted on the right shin.  Able to dorsiflex and plantarflex her ankles. Neurologic:  Normal speech and language. No gross focal neurologic deficits are appreciated.  Skin:  Skin is warm, dry and intact. No rash noted. Psychiatric: Mood and affect are normal. Speech and behavior are normal. GU: Deferred   ____________________________________________   LABS (all labs ordered are listed, but only abnormal results are displayed)  Labs Reviewed  BASIC METABOLIC PANEL - Abnormal; Notable for the following components:      Result Value   Glucose, Bld 127 (*)    All other components within normal limits  RESP PANEL BY RT-PCR (FLU A&B, COVID) ARPGX2  CBC  HEPATIC FUNCTION PANEL  URINALYSIS, ROUTINE W REFLEX MICROSCOPIC  BRAIN NATRIURETIC PEPTIDE  TROPONIN I (HIGH SENSITIVITY)   ____________________________________________   ED ECG REPORT I, Vanessa Freestone, the attending physician, personally viewed and interpreted this ECG.  Normal sinus rate of 70, no ST elevation, no T wave inversions, normal intervals ____________________________________________  RADIOLOGY Robert Bellow, personally viewed and evaluated these images (plain radiographs) as part of my  medical decision making, as well as reviewing the written report by the radiologist.  ED MD interpretation: No evidence of pneumonia  Official radiology report(s): DG Chest 2 View  Result Date: 10/29/2021 CLINICAL DATA:  Shortness of breath EXAM: CHEST - 2 VIEW COMPARISON:  Radiograph 07/24/2020 FINDINGS: Unchanged cardiomediastinal silhouette. There is no focal airspace consolidation. There is no pleural  effusion or visible pneumothorax. There is no acute osseous abnormality. Right upper quadrant surgical clips. IMPRESSION: No evidence of acute cardiopulmonary disease. Electronically Signed   By: Maurine Simmering M.D.   On: 10/29/2021 11:04    ____________________________________________   PROCEDURES  Procedure(s) performed (including Critical Care):  Procedures   ____________________________________________   INITIAL IMPRESSION / ASSESSMENT AND PLAN / ED COURSE  Nichole Brown was evaluated in Emergency Department on 10/29/2021 for the symptoms described in the history of present illness. She was evaluated in the context of the global COVID-19 pandemic, which necessitated consideration that the patient might be at risk for infection with the SARS-CoV-2 virus that causes COVID-19. Institutional protocols and algorithms that pertain to the evaluation of patients at risk for COVID-19 are in a state of rapid change based on information released by regulatory bodies including the CDC and federal and state organizations. These policies and algorithms were followed during the patient's care in the ED.     Patient comes in with lower extremity tingling sensation.  Patient's not had any ultrasounds for a few months.  We can repeat them to make sure no evidence of DVT.  She is got good distal pulses and I have low suspicion for an acute arterial occlusion however we will give her vascular follow-up which she expressed understanding for.  Patient understands if she develops a change in the color of  the foot that she would need to return but at this time she is got great pulses.  Labs ordered evaluate for any anemia, ACS, CHF or other acute pathology.  Denies high risk behavior to suggest HIV.  His story sounds like neuropathy but unclear why she would have this.  She will follow-up with her primary care doctor for vitamin testing as well.  Repeat evaluation patient resting comfortably in bed denies any shortness of breath or other concerns.  Labs are reassuring.  No evidence of UTI, no evidence of ACS, no evidence of anemia or electrolyte abnormalities, thyroid is normal.  DVT ultrasounds are negative.  Chest x-ray with evidence of pneumonia or pleural effusion.  At this time patient will follow-up with her primary care doctor for further work-up as well as the vascular doctor but given this is going on for months.  I discussed the provisional nature of ED diagnosis, the treatment so far, the ongoing plan of care, follow up appointments and return precautions with the patient and any family or support people present. They expressed understanding and agreed with the plan, discharged home.      ____________________________________________   FINAL CLINICAL IMPRESSION(S) / ED DIAGNOSES   Final diagnoses:  Right leg pain  Tingling      MEDICATIONS GIVEN DURING THIS VISIT:  Medications - No data to display   ED Discharge Orders     None        Note:  This document was prepared using Dragon voice recognition software and may include unintentional dictation errors.    Vanessa Anguilla, MD 10/29/21 1322

## 2021-10-29 NOTE — ED Notes (Signed)
See triage note  presents with pain to both legs  pain started from heels into legs  no fever

## 2021-10-29 NOTE — Discharge Instructions (Addendum)
You should follow-up with your primary care doctor to make sure they have tested you for other causes of neuropathy such as vitamins etc.  You should also call the vascular doctor to make follow-up.  If you ever notice your foot return cold and change colors and you cannot feel a pulse she should return to the ER immediately.

## 2021-11-08 ENCOUNTER — Encounter (INDEPENDENT_AMBULATORY_CARE_PROVIDER_SITE_OTHER): Payer: Self-pay | Admitting: Nurse Practitioner

## 2021-11-08 ENCOUNTER — Ambulatory Visit (INDEPENDENT_AMBULATORY_CARE_PROVIDER_SITE_OTHER): Payer: 59 | Admitting: Nurse Practitioner

## 2021-11-08 ENCOUNTER — Other Ambulatory Visit: Payer: Self-pay

## 2021-11-08 VITALS — BP 106/68 | HR 102 | Ht 62.0 in | Wt 160.0 lb

## 2021-11-08 DIAGNOSIS — M79604 Pain in right leg: Secondary | ICD-10-CM

## 2021-11-08 DIAGNOSIS — M797 Fibromyalgia: Secondary | ICD-10-CM

## 2021-11-08 NOTE — Progress Notes (Signed)
Subjective:    Patient ID: Nichole Brown, female    DOB: 18-Sep-1966, 55 y.o.   MRN: [id number] Chief Complaint  Patient presents with   New Patient (Initial Visit)    NP  consult paion swelling  right lower referred by fowler  justin     Nichole Brown is a 55 year old female that presents today for evaluation of right lower extremity leg pain.  The patient has had this pain for approximately 6 months.  She notes that the pain is constant.  There is no difference in pain when she is walking or resting.  She describes the pain as a slight numbness/electrical feeling that shoots from her heel.  She recently saw Dr. Vickki Muff and she was diagnosed with plantar fasciitis.  She has had 2 steroid shots that helped for about 2 or 3 weeks.  She is also tried other conservative treatments for plantar fasciitis including ice and elevation but this is not cause any drastic difference in her pain.  She also notes that she had a PFO when she was younger.  Initially this did present with some swelling and redness and a DVT study was done and this was negative.   Review of Systems  Cardiovascular:  Positive for leg swelling.  Musculoskeletal:  Positive for gait problem.  All other systems reviewed and are negative.     Objective:   Physical Exam Vitals reviewed.  HENT:     Head: Normocephalic.  Cardiovascular:     Rate and Rhythm: Normal rate.     Pulses:          Dorsalis pedis pulses are 1+ on the right side.       Posterior tibial pulses are 1+ on the right side.  Pulmonary:     Effort: Pulmonary effort is normal.  Skin:    General: Skin is warm and dry.  Neurological:     Mental Status: She is alert and oriented to person, place, and time.  Psychiatric:        Mood and Affect: Mood normal.        Behavior: Behavior normal.        Thought Content: Thought content normal.        Judgment: Judgment normal.    BP 106/68   Pulse (!) 102   Ht 5\' 2"  (1.575 m)   Wt 160 lb (72.6 kg)   BMI  29.26 kg/m   Past Medical History:  Diagnosis Date   Anxiety    Bronchitis    Complication of anesthesia    PONV   Depression    Fibromyalgia 2005   GERD (gastroesophageal reflux disease)    Insomnia    Migraine headache    Pneumonia    PONV (postoperative nausea and vomiting)     Social History   Socioeconomic History   Marital status: Married    Spouse name: daniel   Number of children: 4   Years of education: Not on file   Highest education level: Some college, no degree  Occupational History   Not on file  Tobacco Use   Smoking status: Former   Smokeless tobacco: Never  Scientific laboratory technician Use: Never used  Substance and Sexual Activity   Alcohol use: Yes    Comment: occassionally   Drug use: No   Sexual activity: Yes    Birth control/protection: Surgical  Other Topics Concern   Not on file  Social History Narrative   Not on file  Social Determinants of Health   Financial Resource Strain: Not on file  Food Insecurity: Not on file  Transportation Needs: Not on file  Physical Activity: Not on file  Stress: Not on file  Social Connections: Not on file  Intimate Partner Violence: Not on file    Past Surgical History:  Procedure Laterality Date   ABDOMINAL HYSTERECTOMY     BALLOON DILATION N/A 12/14/2020   Procedure: [place];  Surgeon: Eloise Harman, DO;  [facility];  Service: Endoscopy;  Laterality: N/A;   BIOPSY  12/14/2020   Procedure: BIOPSY;  Surgeon: Eloise Harman, DO;  [facility];  Service: Endoscopy;;   CESAREAN SECTION     CHOLECYSTECTOMY  1988   COLONOSCOPY N/A 12/10/2018    six 4-8 mm polyps in proximal descending colon, mid transverse and hepatic flexure. Diverticulosis. External and internal hemorrhoids. Torturous colon. Two simple adenomas, two serrated polyps, 2 hyperplastic polyps. Surveillance due Dec 2022.    ESOPHAGOGASTRODUODENOSCOPY (EGD) WITH PROPOFOL N/A 12/14/2020   Benign-appearing  esophageal stenosis s/p dilation, gastritis, normal duodenum. Mild chronic gastritis.    open heart surgery  1967   PATENT DUCTUS ARTERIOUS REPAIR  1967   POLYPECTOMY  12/10/2018   Procedure: POLYPECTOMY;  Surgeon: Danie Binder, MD;  [facility];  Service: Endoscopy;;  hepatic flexure, descending, transverse   SHOULDER ARTHROSCOPY Left 06/16/2017   Procedure: LEFT SHOULDER ARTHROSCOPY, DEBRIDEMENT, AND DECOMPRESSION;  Surgeon: Newt Minion, MD;  Location: [place];  Service: Orthopedics;  Laterality: Left;   SHOULDER ARTHROSCOPY WITH SUBACROMIAL DECOMPRESSION Left 10/03/2019   Procedure: SHOULDER ARTHROSCOPY WITH DEBRIDEMENT, DECOMPRESSION;  Surgeon: Corky Mull, MD;  Location: [facility];  Service: Orthopedics;  Laterality: Left;   SHOULDER SURGERY Left 2008   AC joint    Family History  Problem Relation Age of Onset   Hypertension Maternal Grandmother    Diabetes Maternal Grandmother    Heart disease Maternal Grandmother 50       MI   Hypertension Maternal Grandfather    Throat cancer Maternal Grandfather    Breast cancer Mother 75   Hypertension Father    Colon cancer Neg Hx     Allergies  Allergen Reactions   Cymbalta [Duloxetine Hcl] Other (See Comments)    Hallucinations; sleep walking   Nortriptyline Nausea And Vomiting   Phenergan [Promethazine Hcl]     Restless legs and cramping   Topiramate Other (See Comments)    Dry eyes, visual changes; advised to stop med by her optometrist Dry eyes, visual changes; advised to stop med by her optometrist - update patient eye dr said OK to use    CBC Latest Ref Rng & Units 10/29/2021 10/07/2020 07/23/2019  WBC 4.0 - 10.5 K/uL 8.4 9.2 7.6  Hemoglobin 12.0 - 15.0 g/dL 13.9 12.7 13.4  Hematocrit 36.0 - 46.0 % 41.0 38.3 39.9  Platelets 150 - 400 K/uL 310 264 249      CMP     Component Value Date/Time   NA 136 10/29/2021 1030   NA 143 09/22/2014 0615   K 3.7 10/29/2021 1030   K 4.0 09/22/2014 0615   CL 106  10/29/2021 1030   CL 108 (H) 09/22/2014 0615   CO2 23 10/29/2021 1030   CO2 28 09/22/2014 0615   GLUCOSE 127 (H) 10/29/2021 1030   GLUCOSE 84 09/22/2014 0615   BUN 14 10/29/2021 1030   BUN 12 09/22/2014 0615   CREATININE 0.62 10/29/2021 1030  CREATININE 0.65 09/22/2014 0615   CALCIUM 9.2 10/29/2021 1030   CALCIUM 8.4 (L) 09/22/2014 0615   PROT 7.1 10/29/2021 1030   PROT 6.7 09/22/2014 0615   ALBUMIN 4.1 10/29/2021 1030   ALBUMIN 3.5 09/22/2014 0615   AST 20 10/29/2021 1030   AST 26 09/22/2014 0615   ALT 25 10/29/2021 1030   ALT 36 09/22/2014 0615   ALKPHOS 75 10/29/2021 1030   ALKPHOS 83 09/22/2014 0615   BILITOT 0.5 10/29/2021 1030   BILITOT 0.2 09/22/2014 0615   GFRNONAA >60 10/29/2021 1030   GFRNONAA >60 09/22/2014 0615   GFRAA >60 07/23/2019 0815   GFRAA >60 09/22/2014 0615     No results found.     Assessment & Plan:   1. Leg pain, diffuse, right  Recommend:  The patient has atypical pain symptoms for pure atherosclerotic disease. 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  Noninvasive studies including ABI's and venous ultrasound of the legs will be obtained and the patient will follow up with me to review these studies.   - VAS [place] ABI WITH/WO TBI; Future - VAS [place] LOWER EXTREMITY VENOUS REFLUX; Future  2. Fibromyalgia This may also play a part in the patient's pain and discomfort.   Current Outpatient Medications on File Prior to Visit  Medication Sig Dispense Refill   albuterol (VENTOLIN HFA) 108 (90 Base) MCG/ACT inhaler Inhale 2 puffs into the lungs 4 (four) times daily as needed for wheezing or shortness of breath. 18 g 2   Cholecalciferol (VITAMIN D) 50 MCG (2000 UT) CAPS Take by mouth daily.     famotidine (PEPCID) 20 MG tablet      Lifitegrast (XIIDRA) 5 % SOLN Instill 1 drop OU bid. 180 each 4   ondansetron (ZOFRAN-ODT) 4  MG disintegrating tablet Take 1 tablet (4 mg total) by mouth every 8 (eight) hours as needed for nausea or vomiting. 20 tablet 0   ondansetron (ZOFRAN-ODT) 4 MG disintegrating tablet Take 1 tablet (4 mg total) by mouth every 8 (eight) hours as needed for Nausea migraine 20 tablet 11   ondansetron (ZOFRAN-ODT) 4 MG disintegrating tablet Take 1 tablet (4 mg total) by mouth every 8 (eight) hours as needed for Nausea migraine 20 tablet 11   rizatriptan (MAXALT-MLT) 10 MG disintegrating tablet Take 1 tablet (10 mg total) by mouth once as needed (may repeat dose once in > 2 hrs) for up to 1 dose 10 tablet 11   rizatriptan (MAXALT-MLT) 10 MG disintegrating tablet May take a second dose after 2 hours if needed. 10 tablet 11   topiramate (TOPAMAX) 25 MG tablet Take 1 tablet (25 mg total) by mouth 2 (two) times daily 60 tablet 11   XIIDRA 5 % SOLN Place 1 drop into both eyes 2 (two) times daily.     etodolac (LODINE) 500 MG tablet Take 1 tablet (500 mg total) by mouth 2 (two) times daily for 30 days (Patient not taking: Reported on 11/08/2021) 60 tablet 1   NEOMYCIN-POLYMYXIN-HYDROCORTISONE (CORTISPORIN) 1 % SOLN OTIC solution PLACE 4 DROPS TO THE AFFECTED EAR/EARS 3 TIMES A DAY FOR 7 DAYS. (Patient not taking: Reported on 11/08/2021) 10 mL 0   [DISCONTINUED] sucralfate (CARAFATE) 1 GM/10ML suspension Take 10 mLs (1 g total) by mouth 4 (four) times daily -  with meals and at bedtime. (Patient not taking: Reported on 12/07/2020) 420 mL 0  No current facility-administered medications on file prior to visit.    There are no Patient Instructions on file for this visit. No follow-ups on file.   Kris Hartmann, NP

## 2021-11-10 ENCOUNTER — Other Ambulatory Visit: Payer: Self-pay

## 2021-11-10 DIAGNOSIS — H9201 Otalgia, right ear: Secondary | ICD-10-CM | POA: Diagnosis not present

## 2021-11-10 DIAGNOSIS — R5383 Other fatigue: Secondary | ICD-10-CM | POA: Diagnosis not present

## 2021-11-10 DIAGNOSIS — J019 Acute sinusitis, unspecified: Secondary | ICD-10-CM | POA: Diagnosis not present

## 2021-11-10 DIAGNOSIS — R059 Cough, unspecified: Secondary | ICD-10-CM | POA: Diagnosis not present

## 2021-11-10 MED ORDER — PREDNISONE 10 MG PO TABS
ORAL_TABLET | ORAL | 0 refills | Status: DC
  Filled 2021-11-10: qty 21, 6d supply, fill #0

## 2021-11-10 MED ORDER — AMOXICILLIN-POT CLAVULANATE 500-125 MG PO TABS
ORAL_TABLET | ORAL | 0 refills | Status: DC
  Filled 2021-11-10: qty 14, 7d supply, fill #0

## 2021-11-11 ENCOUNTER — Other Ambulatory Visit: Payer: Self-pay

## 2021-11-17 ENCOUNTER — Other Ambulatory Visit: Payer: Self-pay

## 2021-11-17 ENCOUNTER — Ambulatory Visit (INDEPENDENT_AMBULATORY_CARE_PROVIDER_SITE_OTHER): Payer: 59 | Admitting: Licensed Clinical Social Worker

## 2021-11-17 DIAGNOSIS — F4310 Post-traumatic stress disorder, unspecified: Secondary | ICD-10-CM

## 2021-11-17 NOTE — Progress Notes (Signed)
   THERAPIST PROGRESS NOTE  Session Time: 4-5p  Participation Level: Active  Behavioral Response: Neat and Well GroomedAlertAnxious  Type of Therapy: Individual Therapy  Treatment Goals addressed:  Problem: Reduce the negative impact trauma related symptoms have on social, occupational, and family functioning.  Goal: LTG: Reduce frequency, intensity, and duration of PTSD symptoms so daily functioning is improved: Input needed on appropriate metric.  Pt self report. Outcome: Progressing  Goal: STG: Practice interpersonal effectiveness skills 7 times per week for the next 16 weeks Outcome: Progressing  Interventions:    Intervention: Assist patient to identify own strengths and abilities  Intervention: Identify effective coping behavior  Intervention: Discuss self-management skills  Intervention: Encourage effective communication techniques   Summary: Nichole Brown is a 55 y.o. female who presents with improving symptoms related to PTSD/anxiety.  Pt reports that overall mood has been stable and that she is managing situational stressors well.   Allowed pt to explore and express thoughts and feelings associated with recent life situations and external stressors. Discussed concerns pt has about relationship between daughter and her husband. Pt shared of a recent experience involving a family member and setting a boundary with this family member. Pt feels more empowered to set boundaries to protect her own emotions, thoughts, and feelings. Pt is continuing to read self-help books and feels improvements in many areas:  sleeping better, making healthier life choices, feeling a sense of inner peace, decreasing GI issues, and fewer headaches. Praised pts ability to identify and manage triggers/stressors, set boundaries, and continue to use and find new coping skills.  Continued recommendations are as follows: self care behaviors, positive social engagements, focusing on overall  work/home/life balance, and focusing on positive physical and emotional wellness.   Suicidal/Homicidal: No  Therapist Response: Pt is continuing to apply interventions learned in session into daily life situations. Pt is currently on track to meet goals utilizing interventions mentioned above. Personal growth and progress noted. Treatment to continue as indicated.   Plan: Return again in 4 weeks.  Diagnosis: Axis I: PTSD    Axis II: No diagnosis    Rachel Bo Yuli Lanigan, LCSW 11/17/2021

## 2021-11-18 NOTE — Plan of Care (Signed)
  Problem: Reduce the negative impact trauma related symptoms have on social, occupational, and family functioning. Goal: LTG: Reduce frequency, intensity, and duration of PTSD symptoms so daily functioning is improved: Input needed on appropriate metric.  Pt self report. Outcome: Progressing Goal: STG: Practice interpersonal effectiveness skills 7 times per week for the next 16 weeks Outcome: Progressing Intervention: Assist patient to identify own strengths and abilities Intervention: Identify effective coping behavior Intervention: Discuss self-management skills Intervention: Encourage effective communication techniques

## 2021-11-24 ENCOUNTER — Encounter: Payer: Self-pay | Admitting: Family Medicine

## 2021-11-24 ENCOUNTER — Encounter (INDEPENDENT_AMBULATORY_CARE_PROVIDER_SITE_OTHER): Payer: Self-pay | Admitting: Nurse Practitioner

## 2021-11-24 ENCOUNTER — Other Ambulatory Visit: Payer: Self-pay

## 2021-11-24 ENCOUNTER — Ambulatory Visit (INDEPENDENT_AMBULATORY_CARE_PROVIDER_SITE_OTHER): Payer: 59

## 2021-11-24 ENCOUNTER — Ambulatory Visit (INDEPENDENT_AMBULATORY_CARE_PROVIDER_SITE_OTHER): Payer: 59 | Admitting: Family Medicine

## 2021-11-24 ENCOUNTER — Ambulatory Visit (INDEPENDENT_AMBULATORY_CARE_PROVIDER_SITE_OTHER): Payer: 59 | Admitting: Nurse Practitioner

## 2021-11-24 VITALS — BP 114/75 | HR 65 | Resp 16 | Wt 163.6 lb

## 2021-11-24 VITALS — BP 118/80 | Ht 62.0 in | Wt 160.0 lb

## 2021-11-24 DIAGNOSIS — M79604 Pain in right leg: Secondary | ICD-10-CM | POA: Diagnosis not present

## 2021-11-24 DIAGNOSIS — M5442 Lumbago with sciatica, left side: Secondary | ICD-10-CM | POA: Diagnosis not present

## 2021-11-24 DIAGNOSIS — M797 Fibromyalgia: Secondary | ICD-10-CM | POA: Diagnosis not present

## 2021-11-24 DIAGNOSIS — M5441 Lumbago with sciatica, right side: Secondary | ICD-10-CM

## 2021-11-24 DIAGNOSIS — G8929 Other chronic pain: Secondary | ICD-10-CM

## 2021-11-24 DIAGNOSIS — M79605 Pain in left leg: Secondary | ICD-10-CM

## 2021-11-24 NOTE — Patient Instructions (Addendum)
Your bilateral leg pain is due to neuropathic pain (nerve pain). We will go ahead with an MRI of your lumbar spine to assess for spinal stenosis which can cause this. If this is normal I would recommend evaluation with your neurologist's group at [facility] to consider nerve testing of your legs, further evaluation, and possible medication to treat this. B12 deficiency and diabetes can cause this but these are unlikely in your case. In meantime continue with your compression of the lower legs and wearing the hokas.   I don't think exercises and stretches will help you with the pain. I will call you with the MRI results and next steps.

## 2021-11-24 NOTE — Progress Notes (Signed)
PCP: Remi Haggard, FNP  Subjective:   HPI: Patient is a 55 y.o. female here for bilateral leg pain.  Patient reports she's had over 6 months of bilateral lower leg pain. Started without injury or trauma. Pain radiates from anterior shins just below knee to bottom of feet. Associated tingling during exam of lower leg but has not had numbness/tingling otherwise. She saw podiatry and had treatment for plantar fasciitis including 2 injections that helped for maybe 2-3 weeks. Compression socks provide benefit. 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 Pain in lower legs and feet is worse with rest. Does not find anything besides compression socks that provide relief. Does not exercise regularly - used to walk for exercise but does not any longer. Of note she did have some low back pain - saw chiropractor and had laser treatment to back which helped locally. No bowel/bladder dysfunction.  Past Medical History:  Diagnosis Date   Anxiety    Bronchitis    Complication of anesthesia    PONV   Depression    Fibromyalgia 2005   GERD (gastroesophageal reflux disease)    Insomnia    Migraine headache    Pneumonia    PONV (postoperative nausea and vomiting)     Current Outpatient Medications on File Prior to Visit  Medication Sig Dispense Refill   albuterol (VENTOLIN HFA) 108 (90 Base) MCG/ACT inhaler Inhale 2 puffs into the lungs 4 (four) times daily as needed for wheezing or shortness of breath. 18 g 2   amoxicillin-clavulanate (AUGMENTIN) 500-125 MG tablet 1 tab two times a day 14 tablet 0   Cholecalciferol (VITAMIN D) 50 MCG (2000 UT) CAPS Take by mouth daily.     etodolac (LODINE) 500 MG tablet Take 1 tablet (500 mg total) by mouth 2 (two) times daily for 30 days (Patient not taking: Reported on 11/08/2021) 60 tablet 1   famotidine (PEPCID) 20 MG tablet      Lifitegrast (XIIDRA) 5 %  SOLN Instill 1 drop OU bid. 180 each 4   NEOMYCIN-POLYMYXIN-HYDROCORTISONE (CORTISPORIN) 1 % SOLN OTIC solution PLACE 4 DROPS TO THE AFFECTED EAR/EARS 3 TIMES A DAY FOR 7 DAYS. (Patient not taking: Reported on 11/08/2021) 10 mL 0   ondansetron (ZOFRAN-ODT) 4 MG disintegrating tablet Take 1 tablet (4 mg total) by mouth every 8 (eight) hours as needed for nausea or vomiting. 20 tablet 0   ondansetron (ZOFRAN-ODT) 4 MG disintegrating tablet Take 1 tablet (4 mg total) by mouth every 8 (eight) hours as needed for Nausea migraine 20 tablet 11   ondansetron (ZOFRAN-ODT) 4 MG disintegrating tablet Take 1 tablet (4 mg total) by mouth every 8 (eight) hours as needed for Nausea migraine 20 tablet 11   predniSONE (DELTASONE) 10 MG tablet tabs take as directed 21 tablet 0   rizatriptan (MAXALT-MLT) 10 MG disintegrating tablet Take 1 tablet (10 mg total) by mouth once as needed (may repeat dose once in > 2 hrs) for up to 1 dose 10 tablet 11   rizatriptan (MAXALT-MLT) 10 MG disintegrating tablet May take a second dose after 2 hours if needed. 10 tablet 11   topiramate (TOPAMAX) 25 MG tablet Take 1 tablet (25 mg total) by mouth 2 (two) times daily 60 tablet 11   XIIDRA 5 % SOLN Place 1 drop into both eyes 2 (two) times daily.     [DISCONTINUED] sucralfate (CARAFATE) 1 GM/10ML suspension Take  10 mLs (1 g total) by mouth 4 (four) times daily -  with meals and at bedtime. (Patient not taking: Reported on 12/07/2020) 420 mL 0   No current facility-administered medications on file prior to visit.    Past Surgical History:  Procedure Laterality Date   ABDOMINAL HYSTERECTOMY     BALLOON DILATION N/A 12/14/2020   Procedure: BALLOON DILATION;  Surgeon: Eloise Harman, DO;  Location: [facility];  Service: Endoscopy;  Laterality: N/A;   BIOPSY  12/14/2020   Procedure: BIOPSY;  Surgeon: Eloise Harman, DO;  Location: [facility];  Service: Endoscopy;;   CESAREAN SECTION     CHOLECYSTECTOMY  1988   COLONOSCOPY N/A  12/10/2018    six 4-8 mm polyps in proximal descending colon, mid transverse and hepatic flexure. Diverticulosis. External and internal hemorrhoids. Torturous colon. Two simple adenomas, two serrated polyps, 2 hyperplastic polyps. Surveillance due Dec 2022.    ESOPHAGOGASTRODUODENOSCOPY (EGD) WITH PROPOFOL N/A 12/14/2020   Benign-appearing esophageal stenosis s/p dilation, gastritis, normal duodenum. Mild chronic gastritis.    open heart surgery  1967   PATENT DUCTUS ARTERIOUS REPAIR  1967   POLYPECTOMY  12/10/2018   Procedure: POLYPECTOMY;  Surgeon: Danie Binder, MD;  Location: [facility];  Service: Endoscopy;;  hepatic flexure, descending, transverse   SHOULDER ARTHROSCOPY Left 06/16/2017   Procedure: LEFT SHOULDER ARTHROSCOPY, DEBRIDEMENT, AND DECOMPRESSION;  Surgeon: Newt Minion, MD;  Location: [place];  Service: Orthopedics;  Laterality: Left;   SHOULDER ARTHROSCOPY WITH SUBACROMIAL DECOMPRESSION Left 10/03/2019   Procedure: SHOULDER ARTHROSCOPY WITH DEBRIDEMENT, DECOMPRESSION;  Surgeon: Corky Mull, MD;  Location: [facility];  Service: Orthopedics;  Laterality: Left;   SHOULDER SURGERY Left 2008   AC joint    Allergies  Allergen Reactions   Cymbalta [Duloxetine Hcl] Other (See Comments)    Hallucinations; sleep walking   Nortriptyline Nausea And Vomiting   Phenergan [Promethazine Hcl]     Restless legs and cramping   Topiramate Other (See Comments)    Dry eyes, visual changes; advised to stop med by her optometrist Dry eyes, visual changes; advised to stop med by her optometrist - update patient eye dr said OK to use    BP 118/80    Ht 5\' 2"  (1.575 m)    Wt 160 lb (72.6 kg)    BMI 29.26 kg/m   No flowsheet data found.  No flowsheet data found.      Objective:  Physical Exam:  Gen: NAD, comfortable in exam room  Back: No gross deformity, scoliosis. No paraspinal TTP.  No midline or bony TTP. FROM. Strength LEs 5/5 all muscle groups.   2+ MSRs in patellar and  achilles tendons, equal bilaterally. Negative SLRs. Sensation intact to light touch bilaterally.  Bilateral lower legs: No gross deformity, swelling, ecchymoses FROM ankles with 5/5 strength all directions TTP over anterior tibialis, medial gastroc, plantar foot but describes numbness radiating down leg on palpation medial and lateral to tibia. Negative ant drawer and negative talar tilt.   Negative syndesmotic compression. Thompsons test negative. NV intact distally.   Assessment & Plan:  1. Bilateral lower leg pain - patient describes neuropathic pain but not typical ascending distribution seen with more common peripheral neuropathies.  Exam not consistent with shin splints.  Has been treated for bilateral plantar fasciitis without improvement and pain into anterior shin not consistent with this.  Being evaluated by vascular surgery and worked up there - will complete this.  Pain in lower  legs can be due to spinal stenosis and she did report some low back pain recently and seen chiropractor with some improvement only of her low back pain.  She's done conservative treatment for more than 3 months based on records with persistence of her bilateral lower extremity pain.  Will go ahead with MRI lumbar spine to further assess for spinal stenosis, consider injection, referral to neurosurgery depending on those results.  If no source there, would recommend neurology referral (has a neurologist for her headaches).

## 2021-11-25 NOTE — Addendum Note (Signed)
Addended by: Jolinda Croak E on: 11/25/2021 10:41 AM   Modules accepted: Orders

## 2021-12-01 ENCOUNTER — Ambulatory Visit
Admission: RE | Admit: 2021-12-01 | Discharge: 2021-12-01 | Disposition: A | Discharge status: Home / Self Care | Payer: 59 | Source: Ambulatory Visit | Attending: Family Medicine | Admitting: Family Medicine

## 2021-12-01 ENCOUNTER — Other Ambulatory Visit: Payer: 59

## 2021-12-01 ENCOUNTER — Other Ambulatory Visit: Payer: Self-pay

## 2021-12-01 DIAGNOSIS — M79605 Pain in left leg: Secondary | ICD-10-CM | POA: Insufficient documentation

## 2021-12-01 DIAGNOSIS — M79604 Pain in right leg: Secondary | ICD-10-CM | POA: Insufficient documentation

## 2021-12-01 DIAGNOSIS — M545 Low back pain, unspecified: Secondary | ICD-10-CM | POA: Diagnosis not present

## 2021-12-02 ENCOUNTER — Ambulatory Visit: Admission: RE | Admit: 2021-12-02 | Payer: 59 | Source: Ambulatory Visit

## 2021-12-06 ENCOUNTER — Encounter (INDEPENDENT_AMBULATORY_CARE_PROVIDER_SITE_OTHER): Payer: Self-pay | Admitting: Nurse Practitioner

## 2021-12-06 NOTE — Progress Notes (Signed)
Subjective:    Patient ID: Nichole Brown, female    DOB: 1966/01/17, 55 y.o.   MRN: [id number] Chief Complaint  Patient presents with   Follow-up    Ultrasound follow up    Nichole Brown is a 55 year old female that presents today for evaluation of right lower extremity leg pain.  The patient has had this pain for approximately 6 months.  She notes that the pain is constant.  There is no difference in pain when she is walking or resting.  She describes the pain as a slight numbness/electrical feeling that shoots from her heel.  She recently saw Dr. Vickki Muff and she was diagnosed with plantar fasciitis.  She has had 2 steroid shots that helped for about 2 or 3 weeks.  She is also tried other conservative treatments for plantar fasciitis including ice and elevation but this is not cause any drastic difference in her pain.  She also notes that she had a PFO when she was younger.  Initially this did present with some swelling and redness and a DVT study was done and this was negative.  Today noninvasive studies show no evidence of DVT or superficial thrombophlebitis bilaterally.  No evidence of deep venous insufficiency bilaterally.  No evidence of superficial venous reflux in the short saphenous veins bilaterally.  In the right lower extremity reflux is only seen at the saphenofemoral junction.  No evidence of reflux in the left lower extremity.  Today the patient has an ABI of 1.34 on the right and 1.37 on the left.  She has normal toe pressures.  She also has strong triphasic tibial artery waveforms with good toe waveforms bilaterally.   Review of Systems  Cardiovascular:  Positive for leg swelling.  Musculoskeletal:  Positive for gait problem.  All other systems reviewed and are negative.     Objective:   Physical Exam Vitals reviewed.  HENT:     Head: Normocephalic.  Cardiovascular:     Pulses:          Dorsalis pedis pulses are 1+ on the right side.       Posterior tibial pulses  are 1+ on the right side.  Pulmonary:     Effort: Pulmonary effort is normal.  Musculoskeletal:     Right lower leg: 1+ Edema present.  Neurological:     Mental Status: She is alert and oriented to person, place, and time.  Psychiatric:        Mood and Affect: Mood normal.        Thought Content: Thought content normal.        Judgment: Judgment normal.    BP 114/75 (BP Location: Right Arm)    Pulse 65    Resp 16    Wt 163 lb 9.6 oz (74.2 kg)    BMI 29.92 kg/m   Past Medical History:  Diagnosis Date   Anxiety    Bronchitis    Complication of anesthesia    PONV   Depression    Fibromyalgia 2005   GERD (gastroesophageal reflux disease)    Insomnia    Migraine headache    Pneumonia    PONV (postoperative nausea and vomiting)     Social History   Socioeconomic History   Marital status: Married    Spouse name: daniel   Number of children: 4   Years of education: Not on file   Highest education level: Some college, no degree  Occupational History   Not on file  Tobacco Use  Smoking status: Former   Smokeless tobacco: Never  Scientific laboratory technician Use: Never used  Substance and Sexual Activity   Alcohol use: Yes    Comment: occassionally   Drug use: No   Sexual activity: Yes    Birth control/protection: Surgical  Other Topics Concern   Not on file  Social History Narrative   Not on file   Social Determinants of Health   Financial Resource Strain: Not on file  Food Insecurity: Not on file  Transportation Needs: Not on file  Physical Activity: Not on file  Stress: Not on file  Social Connections: Not on file  Intimate Partner Violence: Not on file    Past Surgical History:  Procedure Laterality Date   ABDOMINAL HYSTERECTOMY     BALLOON DILATION N/A 12/14/2020   Procedure: [place];  Surgeon: Eloise Harman, DO;  Location: [facility];  Service: Endoscopy;  Laterality: N/A;   BIOPSY  12/14/2020   Procedure: BIOPSY;  Surgeon: Eloise Harman,  DO;  Location: [facility];  Service: Endoscopy;;   CESAREAN SECTION     CHOLECYSTECTOMY  1988   COLONOSCOPY N/A 12/10/2018    six 4-8 mm polyps in proximal descending colon, mid transverse and hepatic flexure. Diverticulosis. External and internal hemorrhoids. Torturous colon. Two simple adenomas, two serrated polyps, 2 hyperplastic polyps. Surveillance due Dec 2022.    ESOPHAGOGASTRODUODENOSCOPY (EGD) WITH PROPOFOL N/A 12/14/2020   Benign-appearing esophageal stenosis s/p dilation, gastritis, normal duodenum. Mild chronic gastritis.    open heart surgery  1967   PATENT DUCTUS ARTERIOUS REPAIR  1967   POLYPECTOMY  12/10/2018   Procedure: POLYPECTOMY;  Surgeon: Danie Binder, MD;  Location: [facility];  Service: Endoscopy;;  hepatic flexure, descending, transverse   SHOULDER ARTHROSCOPY Left 06/16/2017   Procedure: LEFT SHOULDER ARTHROSCOPY, DEBRIDEMENT, AND DECOMPRESSION;  Surgeon: Newt Minion, MD;  Location: [place];  Service: Orthopedics;  Laterality: Left;   SHOULDER ARTHROSCOPY WITH SUBACROMIAL DECOMPRESSION Left 10/03/2019   Procedure: SHOULDER ARTHROSCOPY WITH DEBRIDEMENT, DECOMPRESSION;  Surgeon: Corky Mull, MD;  Location: [facility];  Service: Orthopedics;  Laterality: Left;   SHOULDER SURGERY Left 2008   AC joint    Family History  Problem Relation Age of Onset   Hypertension Maternal Grandmother    Diabetes Maternal Grandmother    Heart disease Maternal Grandmother 32       MI   Hypertension Maternal Grandfather    Throat cancer Maternal Grandfather    Breast cancer Mother 8   Hypertension Father    Colon cancer Neg Hx     Allergies  Allergen Reactions   Cymbalta [Duloxetine Hcl] Other (See Comments)    Hallucinations; sleep walking   Nortriptyline Nausea And Vomiting   Phenergan [Promethazine Hcl]     Restless legs and cramping   Topiramate Other (See Comments)    Dry eyes, visual changes; advised to stop med by her optometrist Dry eyes, visual changes;  advised to stop med by her optometrist - update patient eye dr said OK to use    CBC Latest Ref Rng & Units 10/29/2021 10/07/2020 07/23/2019  WBC 4.0 - 10.5 K/uL 8.4 9.2 7.6  Hemoglobin 12.0 - 15.0 g/dL 13.9 12.7 13.4  Hematocrit 36.0 - 46.0 % 41.0 38.3 39.9  Platelets 150 - 400 K/uL 310 264 249      CMP     Component Value Date/Time   NA 136 10/29/2021 1030   NA 143 09/22/2014 0615  K 3.7 10/29/2021 1030   K 4.0 09/22/2014 0615   CL 106 10/29/2021 1030   CL 108 (H) 09/22/2014 0615   CO2 23 10/29/2021 1030   CO2 28 09/22/2014 0615   GLUCOSE 127 (H) 10/29/2021 1030   GLUCOSE 84 09/22/2014 0615   BUN 14 10/29/2021 1030   BUN 12 09/22/2014 0615   CREATININE 0.62 10/29/2021 1030   CREATININE 0.65 09/22/2014 0615   CALCIUM 9.2 10/29/2021 1030   CALCIUM 8.4 (L) 09/22/2014 0615   PROT 7.1 10/29/2021 1030   PROT 6.7 09/22/2014 0615   ALBUMIN 4.1 10/29/2021 1030   ALBUMIN 3.5 09/22/2014 0615   AST 20 10/29/2021 1030   AST 26 09/22/2014 0615   ALT 25 10/29/2021 1030   ALT 36 09/22/2014 0615   ALKPHOS 75 10/29/2021 1030   ALKPHOS 83 09/22/2014 0615   BILITOT 0.5 10/29/2021 1030   BILITOT 0.2 09/22/2014 0615   GFRNONAA >60 10/29/2021 1030   GFRNONAA >60 09/22/2014 0615   GFRAA >60 07/23/2019 0815   GFRAA >60 09/22/2014 0615     VAS [place] ABI WITH/WO TBI  Result Date: 11/25/2021  LOWER EXTREMITY DOPPLER STUDY Patient Name:  Nichole Brown  Date of Exam:   11/24/2021 Medical Rec #: [id number]         Accession #:    [id number] Date of Birth: 01-Mar-1966         Patient Gender: F Patient Age:   18 years Exam Location:  [facility] Procedure:      VAS [place] ABI WITH/WO TBI Referring Phys: Hortencia Pilar --------------------------------------------------------------------------------  Indications: Rest pain.  Performing Technologist: Almira Coaster RVS  Examination Guidelines: A complete evaluation includes at minimum, Doppler waveform signals and systolic blood pressure  reading at the level of bilateral brachial, anterior tibial, and posterior tibial arteries, when vessel segments are accessible. Bilateral testing is considered an integral part of a complete examination. Photoelectric Plethysmograph (PPG) waveforms and toe systolic pressure readings are included as required and additional duplex testing as needed. Limited examinations for reoccurring indications may be performed as noted.  ABI Findings: +---------+------------------+-----+---------+--------+  Right     Rt Pressure (mmHg) Index Waveform  Comment   +---------+------------------+-----+---------+--------+  Brachial  116                                          +---------+------------------+-----+---------+--------+  ATA       150                      triphasic 1.29      +---------+------------------+-----+---------+--------+  PTA       156                1.34  triphasic           +---------+------------------+-----+---------+--------+  Great Toe 113                0.97  Normal              +---------+------------------+-----+---------+--------+ +---------+------------------+-----+---------+-------+  Left      Lt Pressure (mmHg) Index Waveform  Comment  +---------+------------------+-----+---------+-------+  Brachial  107                                         +---------+------------------+-----+---------+-------+  ATA       143                      triphasic 1.23     +---------+------------------+-----+---------+-------+  PTA       159                1.37  triphasic          +---------+------------------+-----+---------+-------+  Great Toe 121                1.04  Normal             +---------+------------------+-----+---------+-------+ +-------+-----------+-----------+------------+------------+  ABI/TBI Today's ABI Today's TBI Previous ABI Previous TBI  +-------+-----------+-----------+------------+------------+  Right   1.34        .97                                     +-------+-----------+-----------+------------+------------+  Left    1.37        1.04                                   +-------+-----------+-----------+------------+------------+  Summary: Right: Resting right ankle-brachial index is within normal range. No evidence of significant right lower extremity arterial disease. Left: Resting left ankle-brachial index is within normal range. No evidence of significant left lower extremity arterial disease. The left toe-brachial index is normal.  *See table(s) above for measurements and observations.  Electronically signed by Hortencia Pilar MD on 11/25/2021 at 5:35:05 PM.    Final        Assessment & Plan:   1. Leg pain, diffuse, right Recommend:  I do not find evidence of Vascular pathology that would explain the patient's symptoms  The patient has atypical pain symptoms for vascular disease  I do not find evidence of Vascular pathology that would explain the patient's symptoms and I suspect the patient is c/o pseudoclaudication.  Patient should have an evaluation of his LS spine which I defer to the primary service.  Noninvasive studies including venous ultrasound of the legs do not identify vascular problems  The patient should continue walking and begin a more formal exercise program. The patient should continue his antiplatelet therapy and aggressive treatment of the lipid abnormalities. The patient should begin wearing graduated compression socks 15-20 mmHg strength to control her mild edema.  Patient will follow-up with me on a PRN basis  Further work-up of her lower extremity pain is deferred to the primary service      2. Fibromyalgia This could also be a possible cause of the patient's discomfort patient is advised to follow-up with neurology and her PCP for evaluation.   Current Outpatient Medications on File Prior to Visit  Medication Sig Dispense Refill   albuterol (VENTOLIN HFA) 108 (90 Base) MCG/ACT inhaler Inhale 2 puffs into  the lungs 4 (four) times daily as needed for wheezing or shortness of breath. 18 g 2   amoxicillin-clavulanate (AUGMENTIN) 500-125 MG tablet 1 tab two times a day 14 tablet 0   Cholecalciferol (VITAMIN D) 50 MCG (2000 UT) CAPS Take by mouth daily.     famotidine (PEPCID) 20 MG tablet      Lifitegrast (XIIDRA) 5 % SOLN Instill 1 drop OU bid. 180 each 4   ondansetron (ZOFRAN-ODT) 4 MG disintegrating tablet Take 1 tablet (4 mg total)  by mouth every 8 (eight) hours as needed for nausea or vomiting. 20 tablet 0   ondansetron (ZOFRAN-ODT) 4 MG disintegrating tablet Take 1 tablet (4 mg total) by mouth every 8 (eight) hours as needed for Nausea migraine 20 tablet 11   ondansetron (ZOFRAN-ODT) 4 MG disintegrating tablet Take 1 tablet (4 mg total) by mouth every 8 (eight) hours as needed for Nausea migraine 20 tablet 11   predniSONE (DELTASONE) 10 MG tablet tabs take as directed 21 tablet 0   rizatriptan (MAXALT-MLT) 10 MG disintegrating tablet Take 1 tablet (10 mg total) by mouth once as needed (may repeat dose once in > 2 hrs) for up to 1 dose 10 tablet 11   rizatriptan (MAXALT-MLT) 10 MG disintegrating tablet May take a second dose after 2 hours if needed. 10 tablet 11   topiramate (TOPAMAX) 25 MG tablet Take 1 tablet (25 mg total) by mouth 2 (two) times daily 60 tablet 11   XIIDRA 5 % SOLN Place 1 drop into both eyes 2 (two) times daily.     etodolac (LODINE) 500 MG tablet Take 1 tablet (500 mg total) by mouth 2 (two) times daily for 30 days (Patient not taking: Reported on 11/08/2021) 60 tablet 1   NEOMYCIN-POLYMYXIN-HYDROCORTISONE (CORTISPORIN) 1 % SOLN OTIC solution PLACE 4 DROPS TO THE AFFECTED EAR/EARS 3 TIMES A DAY FOR 7 DAYS. (Patient not taking: Reported on 11/08/2021) 10 mL 0   [DISCONTINUED] sucralfate (CARAFATE) 1 GM/10ML suspension Take 10 mLs (1 g total) by mouth 4 (four) times daily -  with meals and at bedtime. (Patient not taking: Reported on 12/07/2020) 420 mL 0   No current  facility-administered medications on file prior to visit.    There are no Patient Instructions on file for this visit. No follow-ups on file.   Kris Hartmann, NP

## 2021-12-09 ENCOUNTER — Encounter: Payer: Self-pay | Admitting: *Deleted

## 2021-12-15 ENCOUNTER — Encounter: Payer: Self-pay | Admitting: Licensed Clinical Social Worker

## 2021-12-15 ENCOUNTER — Ambulatory Visit (INDEPENDENT_AMBULATORY_CARE_PROVIDER_SITE_OTHER): Payer: 59 | Admitting: Licensed Clinical Social Worker

## 2021-12-15 ENCOUNTER — Other Ambulatory Visit: Payer: Self-pay

## 2021-12-15 DIAGNOSIS — F4310 Post-traumatic stress disorder, unspecified: Secondary | ICD-10-CM

## 2021-12-16 NOTE — Plan of Care (Signed)
°  Problem: Reduce the negative impact trauma related symptoms have on social, occupational, and family functioning. Goal: LTG: Reduce frequency, intensity, and duration of PTSD symptoms so daily functioning is improved: Input needed on appropriate metric.  Pt self report. Outcome: Not Progressing Note: Slight increase in symptoms after recent conflict w/ family member Goal: STG: Practice interpersonal effectiveness skills 7 times per week for the next 16 weeks Outcome: Progressing

## 2021-12-16 NOTE — Progress Notes (Signed)
° °  THERAPIST PROGRESS NOTE  Session Time: 4-5p  Participation Level: Active  Behavioral Response: Neat and Well GroomedAlertAnxious  Type of Therapy: Individual Therapy  Treatment Goals addressed:  Problem: Reduce the negative impact trauma related symptoms have on social, occupational, and family functioning.  Goal: LTG: Reduce frequency, intensity, and duration of PTSD symptoms so daily functioning is improved: Input needed on appropriate metric.  Pt self report. Outcome:not progressing  Goal: STG: Practice interpersonal effectiveness skills 7 times per week for the next 16 weeks Outcome: Progressing  Interventions:    Intervention: Assist patient to identify own strengths and abilities  Intervention: Identify effective coping behavior  Intervention: Discuss self-management skills  Intervention: Encourage effective communication techniques   Summary: Nichole Brown is a 56 y.o. female who presents with improving symptoms related to PTSD/anxiety.    Allowed pt to explore and express thoughts and feelings associated with recent life situations and external stressors. Discussed concerns pt has about relationship between daughter and her husband. Pt reports that she is trying to emotionally accept the situation between her daughter and husband. Discussed daughter's current work schedule and how this is impacting pt since daughter feels pt should be watching her children. Pt is trying to be more direct with her communication with her daughter and son in law. Praised pts ability to identify and manage triggers/stressors, set boundaries, and continue to use and find new coping skills.  Continued recommendations are as follows: self care behaviors, positive social engagements, focusing on overall work/home/life balance, and focusing on positive physical and emotional wellness.   Suicidal/Homicidal: No  Therapist Response: Pt is continuing to apply interventions learned in session  into daily life situations. Pt is currently on track to meet goals utilizing interventions mentioned above. Personal growth and progress noted. Treatment to continue as indicated.   Plan: Return again in 4 weeks.  Diagnosis: Axis I: PTSD    Axis II: No diagnosis    Rachel Bo Xachary Hambly, LCSW 12/16/2021

## 2021-12-20 ENCOUNTER — Other Ambulatory Visit: Payer: Self-pay

## 2021-12-20 DIAGNOSIS — R070 Pain in throat: Secondary | ICD-10-CM | POA: Diagnosis not present

## 2021-12-20 DIAGNOSIS — R498 Other voice and resonance disorders: Secondary | ICD-10-CM | POA: Diagnosis not present

## 2021-12-20 DIAGNOSIS — J020 Streptococcal pharyngitis: Secondary | ICD-10-CM | POA: Diagnosis not present

## 2021-12-20 DIAGNOSIS — R519 Headache, unspecified: Secondary | ICD-10-CM | POA: Diagnosis not present

## 2021-12-20 MED ORDER — AMOXICILLIN-POT CLAVULANATE 500-125 MG PO TABS
ORAL_TABLET | ORAL | 0 refills | Status: DC
  Filled 2021-12-20: qty 14, 7d supply, fill #0

## 2021-12-23 ENCOUNTER — Ambulatory Visit: Payer: 59 | Admitting: Sports Medicine

## 2021-12-29 ENCOUNTER — Other Ambulatory Visit: Payer: Self-pay

## 2021-12-29 ENCOUNTER — Ambulatory Visit (INDEPENDENT_AMBULATORY_CARE_PROVIDER_SITE_OTHER): Payer: 59 | Admitting: Licensed Clinical Social Worker

## 2021-12-29 DIAGNOSIS — F4310 Post-traumatic stress disorder, unspecified: Secondary | ICD-10-CM

## 2021-12-29 NOTE — Progress Notes (Signed)
° °  THERAPIST PROGRESS NOTE  Session Time: 4-5p  Participation Level: Active  Behavioral Response: Neat and Well GroomedAlertAnxious  Type of Therapy: Individual Therapy  Treatment Goals addressed:  Problem: Reduce the negative impact trauma related symptoms have on social, occupational, and family functioning.  Goal: LTG: Reduce frequency, intensity, and duration of PTSD symptoms so daily functioning is improved: Input needed on appropriate metric.  Pt self report. Outcome:not progressing  Goal: STG: Practice interpersonal effectiveness skills 7 times per week for the next 16 weeks Outcome: Progressing  Interventions:    Intervention: Assist patient to identify own strengths and abilities  Intervention: Identify effective coping behavior  Intervention: Discuss self-management skills  Intervention: Encourage effective communication techniques   Summary: Nichole Brown is a 56 y.o. female who presents with improving symptoms related to PTSD/anxiety.    Allowed pt to explore and express thoughts and feelings associated with recent life situations and external stressors. Discussed pts overall health--has been sick with strep for the past few days. Pt reports that she is feeling better and enjoyed having the time off work to relax and feel better physically and emotionally. Explored pts relationship with daughter--pt has decided to work more on setting boundaries with her and not taking things personally when her daughter makes decisions. Pt is reading book "the four agreements" and feels that it is helping her with her self discovery and self awareness. Pt feels a decrease in depression and anxiety symptoms since last session.  Continued recommendations are as follows: self care behaviors, positive social engagements, focusing on overall work/home/life balance, and focusing on positive physical and emotional wellness.   Suicidal/Homicidal: No  Therapist Response: Pt is continuing  to apply interventions learned in session into daily life situations. Pt is currently on track to meet goals utilizing interventions mentioned above. Personal growth and progress noted. Treatment to continue as indicated.   Plan: Return again in 4 weeks.  Diagnosis: Axis I: PTSD    Axis II: No diagnosis    Rachel Bo Ephriam Turman, LCSW 12/29/2021

## 2021-12-30 ENCOUNTER — Encounter: Payer: Self-pay | Admitting: Licensed Clinical Social Worker

## 2021-12-30 NOTE — Plan of Care (Signed)
°  Problem: Reduce the negative impact trauma related symptoms have on social, occupational, and family functioning. Goal: LTG: Reduce frequency, intensity, and duration of PTSD symptoms so daily functioning is improved: Input needed on appropriate metric.  Pt self report. Outcome: Progressing Goal: STG: Practice interpersonal effectiveness skills 7 times per week for the next 16 weeks Outcome: Progressing   

## 2021-12-31 ENCOUNTER — Other Ambulatory Visit: Payer: Self-pay

## 2022-01-19 ENCOUNTER — Other Ambulatory Visit: Payer: Self-pay

## 2022-01-19 ENCOUNTER — Ambulatory Visit (INDEPENDENT_AMBULATORY_CARE_PROVIDER_SITE_OTHER): Payer: 59 | Admitting: Licensed Clinical Social Worker

## 2022-01-19 DIAGNOSIS — F4310 Post-traumatic stress disorder, unspecified: Secondary | ICD-10-CM | POA: Diagnosis not present

## 2022-01-19 NOTE — Progress Notes (Signed)
° °  THERAPIST PROGRESS NOTE  Session Time: 4-5p  Participation Level: Active  Behavioral Response: Neat and Well GroomedAlertAnxious  Type of Therapy: Individual Therapy  Treatment Goals addressed:  Problem: Reduce the negative impact trauma related symptoms have on social, occupational, and family functioning.  Goal: LTG: Reduce frequency, intensity, and duration of PTSD symptoms so daily functioning is improved: Input needed on appropriate metric.  Pt self report. Outcome:not progressing  Goal: STG: Practice interpersonal effectiveness skills 7 times per week for the next 16 weeks Outcome: Progressing  Interventions:    Intervention: Assist patient to identify own strengths and abilities  Intervention: Identify effective coping behavior  Intervention: Discuss self-management skills  Intervention: Encourage effective communication techniques  Intervention: Identify cognitive distortions (CBT)   Summary: Nichole Brown is a 56 y.o. female who presents with improving symptoms related to PTSD/anxiety.    Allowed pt to explore and express thoughts and feelings associated with recent life situations and external stressors. Pt reports work-related stress has decreased and pt feels stable at work. Discussed feelings triggered by husband not following MD suggestions regarding medications and/or other health-related decisions. Discussed pt accepting husband where he is on his own health journey right now and supporting him unconditionally.  Pt states that she has discussed "final arrangements" with him and they are getting medical-based arrangements together.  Discussed how this can decrease anxiety for both of them.   Reviewed worksheets on cognitive distortions and allowed pt to identify which ones were more a part of her own thought process recently. Discussed/explored the following cognitive distortions: polarized thinking, orvergeneralization, emotional reasoning. Pt gave examples  of using distortions. Discussed thought awareness and challenging distortions.   Continued recommendations are as follows: self care behaviors, positive social engagements, focusing on overall work/home/life balance, and focusing on positive physical and emotional wellness.   Suicidal/Homicidal: No  Therapist Response: Pt is continuing to apply interventions learned in session into daily life situations. Pt is currently on track to meet goals utilizing interventions mentioned above. Personal growth and progress noted. Treatment to continue as indicated.   Plan: Return again in 4 weeks.  Diagnosis: Axis I: PTSD    Axis II: No diagnosis    Rachel Bo Cleota Pellerito, LCSW 01/19/2022

## 2022-01-20 NOTE — Plan of Care (Signed)
°  Problem: Reduce the negative impact trauma related symptoms have on social, occupational, and family functioning. Goal: LTG: Reduce frequency, intensity, and duration of PTSD symptoms so daily functioning is improved: Input needed on appropriate metric.  Pt self report. Outcome: Progressing Goal: STG: Practice interpersonal effectiveness skills 7 times per week for the next 16 weeks Outcome: Progressing   Intervention: Assist patient to identify own strengths and abilities  Intervention: Identify effective coping behavior  Intervention: Discuss self-management skills  Intervention: Encourage effective communication techniques  Intervention: Identify cognitive distortions (CBT)

## 2022-01-25 DIAGNOSIS — H10023 Other mucopurulent conjunctivitis, bilateral: Secondary | ICD-10-CM | POA: Diagnosis not present

## 2022-01-25 DIAGNOSIS — R7303 Prediabetes: Secondary | ICD-10-CM | POA: Diagnosis not present

## 2022-02-02 ENCOUNTER — Ambulatory Visit (INDEPENDENT_AMBULATORY_CARE_PROVIDER_SITE_OTHER): Payer: 59 | Admitting: Licensed Clinical Social Worker

## 2022-02-02 ENCOUNTER — Other Ambulatory Visit: Payer: Self-pay

## 2022-02-02 DIAGNOSIS — F4310 Post-traumatic stress disorder, unspecified: Secondary | ICD-10-CM | POA: Diagnosis not present

## 2022-02-03 NOTE — Plan of Care (Signed)
°  Problem: Reduce the negative impact trauma related symptoms have on social, occupational, and family functioning. Goal: LTG: Reduce frequency, intensity, and duration of PTSD symptoms so daily functioning is improved: Input needed on appropriate metric.  Pt self report. Outcome: Progressing Goal: STG: Practice interpersonal effectiveness skills 7 times per week for the next 16 weeks Outcome: Progressing   

## 2022-02-03 NOTE — Progress Notes (Signed)
° °  THERAPIST PROGRESS NOTE  Session Time: 4-5p  Participation Level: Active  Behavioral Response: Neat and Well GroomedAlertAnxious and Euthymic  Type of Therapy: Individual Therapy  Treatment Goals addressed: Problem: Reduce the negative impact trauma related symptoms have on social, occupational, and family functioning. Goal: LTG: Reduce frequency, intensity, and duration of PTSD symptoms so daily functioning is improved: Input needed on appropriate metric.  Pt self report. Outcome: Progressing Goal: STG: Practice interpersonal effectiveness skills 7 times per week for the next 16 weeks Outcome: Progressing  ProgressTowards Goals: Progressing  Interventions: CBT  Summary: Nichole Brown is a 56 y.o. female who presents with improving symptoms related to PTSD/anxiety. Pt reporting good quality and quantity of sleep "the best that I have had in a while"    Allowed pt to explore and express thoughts and feelings associated with recent life situations and external stressors.  Pt reports that recently she has found it easier to "let things go" when family members are making questionable choices. Pt expressed several examples of scenarios that have happened since last session that she has intentionally made the choice to not get involved and just "let it go".  Pt reports that because of this, she feels that she is more free and with less worry and anxiety. Discussed pt continuing to have back pain and how she may involve a chiropractor to help manage the pain. Pt reports that she is as physically active as she can be.  Reviewed mindfulness and meditation as ways to help manage stress and anxiety. Explored relationship with husband and pt feels she is trying to "let go" with some choices that he has made regarding his health.    Continued recommendations are as follows: self care behaviors, positive social engagements, focusing on overall work/home/life balance, and focusing on positive  physical and emotional wellness.   Suicidal/Homicidal: No  Therapist Response: Pt is continuing to apply interventions learned in session into daily life situations. Pt is currently on track to meet goals utilizing interventions mentioned above. Personal growth and progress noted. Treatment to continue as indicated.   Plan: Return again in 4 weeks.  Diagnosis: PTSD (post-traumatic stress disorder)  Collaboration of Care: Other Pt encouraged to continue psychiatric care with Dr. Modesta Messing  Patient/Guardian was advised Release of Information must be obtained prior to any record release in order to collaborate their care with an outside provider. Patient/Guardian was advised if they have not already done so to contact the registration department to sign all necessary forms in order for [place] to release information regarding their care.   Consent: Patient/Guardian gives verbal consent for treatment and assignment of benefits for services provided during this visit. Patient/Guardian expressed understanding and agreed to proceed.   [place], LCSW 02/03/2022

## 2022-02-16 ENCOUNTER — Telehealth (INDEPENDENT_AMBULATORY_CARE_PROVIDER_SITE_OTHER): Payer: 59 | Admitting: Family Medicine

## 2022-02-16 DIAGNOSIS — M79605 Pain in left leg: Secondary | ICD-10-CM

## 2022-02-16 DIAGNOSIS — M79604 Pain in right leg: Secondary | ICD-10-CM | POA: Diagnosis not present

## 2022-02-17 NOTE — Progress Notes (Signed)
Virtual Visit via Video Note ? ?I connected with Nichole Brown on 02/17/22 at  4:15 PM EST by a video enabled telemedicine application and verified that I am speaking with the correct person using two identifiers. ? ?Location: ?Patient: vehicle ?Provider: office ?  ?I discussed the limitations of evaluation and management by telemedicine and the availability of in person appointments. The patient expressed understanding and agreed to proceed. ? ?History of Present Illness: ?Patient continues to struggle with pain in both lower legs from shins down to feet. ?Associated tingling. ?We reviewed MRI results of her lumbar spine and no evidence spinal stenosis to account for her pain. ?She has been doing home exercises for her back and plantar fascia and not noticed much change unfortunately. ?  ?Observations/Objective: ?No acute distress. ? ?Assessment and Plan: ?Bilateral lower leg pain - with associated tingling.  Lumbar spine MRI without evidence cause for her pain. Advised to follow up with neurology (established at [facility]) for additional evaluation. ? ? ?  ?I discussed the assessment and treatment plan with the patient. The patient was provided an opportunity to ask questions and all were answered. The patient agreed with the plan and demonstrated an understanding of the instructions. ?  ?The patient was advised to call back or seek an in-person evaluation if the symptoms worsen or if the condition fails to improve as anticipated. ? ?I provided 10 minutes of non-face-to-face time during this encounter. ? ? ?Karlton Lemon, MD ? ?

## 2022-02-23 DIAGNOSIS — R7303 Prediabetes: Secondary | ICD-10-CM | POA: Diagnosis not present

## 2022-02-23 DIAGNOSIS — M722 Plantar fascial fibromatosis: Secondary | ICD-10-CM | POA: Diagnosis not present

## 2022-02-24 ENCOUNTER — Other Ambulatory Visit: Payer: Self-pay

## 2022-02-24 DIAGNOSIS — M79661 Pain in right lower leg: Secondary | ICD-10-CM | POA: Diagnosis not present

## 2022-02-24 DIAGNOSIS — M79605 Pain in left leg: Secondary | ICD-10-CM | POA: Diagnosis not present

## 2022-02-24 DIAGNOSIS — M545 Low back pain, unspecified: Secondary | ICD-10-CM | POA: Diagnosis not present

## 2022-02-24 MED ORDER — MELOXICAM 7.5 MG PO TABS
ORAL_TABLET | ORAL | 0 refills | Status: DC
  Filled 2022-02-24 – 2022-07-15 (×2): qty 30, 30d supply, fill #0

## 2022-02-28 DIAGNOSIS — M545 Low back pain, unspecified: Secondary | ICD-10-CM | POA: Insufficient documentation

## 2022-03-02 ENCOUNTER — Other Ambulatory Visit: Payer: Self-pay

## 2022-03-02 ENCOUNTER — Ambulatory Visit (INDEPENDENT_AMBULATORY_CARE_PROVIDER_SITE_OTHER): Payer: 59 | Admitting: Licensed Clinical Social Worker

## 2022-03-02 DIAGNOSIS — F4310 Post-traumatic stress disorder, unspecified: Secondary | ICD-10-CM

## 2022-03-02 NOTE — Progress Notes (Signed)
? ?  THERAPIST PROGRESS NOTE ? ?Session Time: 415-5p ? ?Participation Level: Active ? ?Behavioral Response: Neat and Well GroomedAlertAnxious and Euthymic ? ?Type of Therapy: Individual Therapy ? ?Treatment Goals addressed: Problem: Reduce the negative impact trauma related symptoms have on social, occupational, and family functioning. ? ?Goal: LTG: Reduce frequency, intensity, and duration of PTSD symptoms so daily functioning is improved: Input needed on appropriate metric.  Pt self report. ?Outcome: Progressing ? ?Goal: STG: Practice interpersonal effectiveness skills 7 times per week for the next 16 weeks ?Outcome: Progressing ? ?ProgressTowards Goals: Progressing ? ?Interventions: CBT ? ?Intervention: REVIEW PLEASE SKILLS (TREAT PHYSICAL ILLNESS, BALANCE EATING, AVOID MOOD-ALTERING SUBSTANCES, BALANCE SLEEP AND GET EXERCISE) WITH [place] ? ?Intervention: Encourage verbalization of feelings/concerns/expectations ? ?Intervention: Encourage patient to set small goals for self ? ?Interventions: psychoeducational: "[employer] Creating a Healthier Life" ? ?Summary: [name] is a 56 y.o. female who presents with improving symptoms related to PTSD/anxiety. Pt reporting good quality and quantity of sleep  ?  ?Allowed pt to explore and express thoughts and feelings associated with recent life situations and external stressors.  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 Discussed pts relationship with daughter and how she is working hard to set and abide by personal boundaries that she has set with her. Discussed coping skills and overall wellness-related behaviors that pt is trying to manage. Discussed cognitive distortions and how pt is being intentional about recognizing and labeling distorted thoughts.  ? ?Shared wellness behavior packet with pt and allowed pt to discuss/develop personal wellness goals.  ?  ?Continued recommendations are as follows: self care behaviors, positive  social engagements, focusing on overall work/home/life balance, and focusing on positive physical and emotional wellness.  ? ?Suicidal/Homicidal: No ? ?Therapist Response: Pt is continuing to apply interventions learned in session into daily life situations. Pt is currently on track to meet goals utilizing interventions mentioned above. Personal growth and progress noted. Treatment to continue as indicated.  ? ?Plan: Return again in 4 weeks. ? ?Diagnosis: PTSD (post-traumatic stress disorder) ? ?Collaboration of Care: Other Pt encouraged to continue psychiatric care with Dr. Modesta Messing ? ?Patient/Guardian was advised Release of Information must be obtained prior to any record release in order to collaborate their care with an outside provider. Patient/Guardian was advised if they have not already done so to contact the registration department to sign all necessary forms in order for [place] to release information regarding their care.  ? ?Consent: Patient/Guardian gives verbal consent for treatment and assignment of benefits for services provided during this visit. Patient/Guardian expressed understanding and agreed to proceed.  ? ?Reshaun Briseno R Shane Melby, LCSW ?03/02/2022 ? ?

## 2022-03-03 NOTE — Plan of Care (Signed)
?  Problem: Reduce the negative impact trauma related symptoms have on social, occupational, and family functioning. ?Goal: LTG: Reduce frequency, intensity, and duration of PTSD symptoms so daily functioning is improved: Input needed on appropriate metric.  Pt self report. ?Outcome: Progressing ?Goal: STG: Practice interpersonal effectiveness skills 7 times per week for the next 16 weeks ?Outcome: Progressing ?Intervention: REVIEW PLEASE SKILLS (TREAT PHYSICAL ILLNESS, BALANCE EATING, AVOID MOOD-ALTERING SUBSTANCES, BALANCE SLEEP AND GET EXERCISE) WITH Nichole Brown ?Intervention: Encourage verbalization of feelings/concerns/expectations ?Intervention: Encourage patient to set small goals for self ?  ?

## 2022-03-04 ENCOUNTER — Other Ambulatory Visit: Payer: Self-pay

## 2022-03-04 ENCOUNTER — Ambulatory Visit (INDEPENDENT_AMBULATORY_CARE_PROVIDER_SITE_OTHER): Payer: 59

## 2022-03-04 ENCOUNTER — Encounter: Payer: Self-pay | Admitting: Podiatry

## 2022-03-04 ENCOUNTER — Ambulatory Visit: Payer: 59 | Admitting: Podiatry

## 2022-03-04 DIAGNOSIS — M5416 Radiculopathy, lumbar region: Secondary | ICD-10-CM | POA: Diagnosis not present

## 2022-03-04 DIAGNOSIS — M722 Plantar fascial fibromatosis: Secondary | ICD-10-CM

## 2022-03-04 DIAGNOSIS — R202 Paresthesia of skin: Secondary | ICD-10-CM | POA: Diagnosis not present

## 2022-03-04 NOTE — Progress Notes (Signed)
? ?Subjective: ?56 y.o. female presenting today as a new patient for second opinion regarding right heel pain as well as bilateral tightness to the lower extremities up into the calf.  She does admit to having history of L4-L5 lumbar pathology.  She has an appointment with neuro spine on Monday, 03/07/2022.  She presents for further treatment and evaluation ? ? ?Past Medical History:  ?Diagnosis Date  ? Anxiety   ? Bronchitis   ? Complication of anesthesia   ? PONV  ? Depression   ? Fibromyalgia 2005  ? GERD (gastroesophageal reflux disease)   ? Insomnia   ? Migraine headache   ? Pneumonia   ? PONV (postoperative nausea and vomiting)   ? ?Past Surgical History:  ?Procedure Laterality Date  ? ABDOMINAL HYSTERECTOMY    ? BALLOON DILATION N/A 12/14/2020  ? Procedure: BALLOON DILATION;  Surgeon: Eloise Harman, DO;  [facility];  Service: Endoscopy;  Laterality: N/A;  ? BIOPSY  12/14/2020  ? Procedure: BIOPSY;  Surgeon: Eloise Harman, DO;  [facility]: [facility];  Service: Endoscopy;;  ? CESAREAN SECTION    ? CHOLECYSTECTOMY  1988  ? COLONOSCOPY N/A 12/10/2018  ?  six 4-8 mm polyps in proximal descending colon, mid transverse and hepatic flexure. Diverticulosis. External and internal hemorrhoids. Torturous colon. Two simple adenomas, two serrated polyps, 2 hyperplastic polyps. Surveillance due Dec 2022.   ? ESOPHAGOGASTRODUODENOSCOPY (EGD) WITH PROPOFOL N/A 12/14/2020  ? Benign-appearing esophageal stenosis s/p dilation, gastritis, normal duodenum. Mild chronic gastritis.   ? open heart surgery  1967  ? PATENT DUCTUS ARTERIOUS REPAIR  1967  ? POLYPECTOMY  12/10/2018  ? Procedure: POLYPECTOMY;  Surgeon: Danie Binder, MD;  Location: [facility];  Service: Endoscopy;;  hepatic flexure, descending, transverse  ? SHOULDER ARTHROSCOPY Left 06/16/2017  ? Procedure: LEFT SHOULDER ARTHROSCOPY, DEBRIDEMENT, AND DECOMPRESSION;  Surgeon: Newt Minion, MD;  Location: [facility];  Service: Orthopedics;  Laterality:  Left;  ? SHOULDER ARTHROSCOPY WITH SUBACROMIAL DECOMPRESSION Left 10/03/2019  ? Procedure: SHOULDER ARTHROSCOPY WITH DEBRIDEMENT, DECOMPRESSION;  Surgeon: Corky Mull, MD;  Location: [facility];  Service: Orthopedics;  Laterality: Left;  ? SHOULDER SURGERY Left 2008  ? AC joint  ? ?Allergies  ?Allergen Reactions  ? Cymbalta [Duloxetine Hcl] Other (See Comments)  ?  Hallucinations; sleep walking  ? Nortriptyline Nausea And Vomiting  ? Phenergan [Promethazine Hcl]   ?  Restless legs and cramping  ? Topiramate Other (See Comments)  ?  Dry eyes, visual changes; advised to stop med by her optometrist ?Dry eyes, visual changes; advised to stop med by her optometrist - update patient eye dr said OK to use  ? ? ? ?Objective: ?Physical Exam ?General: The patient is alert and oriented x3 in no acute distress. ? ?Dermatology: Skin is warm, dry and supple bilateral lower extremities. Negative for open lesions or macerations bilateral.  ? ?Vascular: Dorsalis Pedis and Posterior Tibial pulses palpable bilateral.  Capillary fill time is immediate to all digits. ? ?Neurological: Epicritic and protective threshold intact bilateral.  ? ?Musculoskeletal: Tenderness to palpation to the plantar aspect of the right heel along the plantar fascia. All other joints range of motion within normal limits bilateral. Strength 5/5 in all groups bilateral.  ? ?Radiographic exam: ?Normal osseous mineralization. Joint spaces preserved. No fracture/dislocation/boney destruction. No other soft tissue abnormalities or radiopaque foreign bodies.  ? ?Assessment: ?1. Plantar fasciitis right ?2.  Lumbar radiculopathy bilateral lower extremities ? ?Plan of Care:  ?  1. Patient evaluated. Xrays reviewed.   ?2.  The patient has already had several injections to the right heel at [facility] clinic.  No injections administered today ?3.  Today the patient was seen by our Pedorthist and molded for custom molded orthotics ?4.  Recommend follow-up with neuro spine for  lumbar pathology ?5.  The patient does not like taking pills.  No anti-inflammatories prescribed today. ?6.  Return to clinic about 1 month after she has been wearing the orthotics ? ?*Phlebotomist at [facility] outpatient ? ? ?Edrick Kins, DPM ?[place] ? ?Dr. Edrick Kins, DPM  ?  ?[address]                                        ?[place], [place] [address]                ?Office [phone]  ?Fax [phone] ? ? ? ? ?

## 2022-03-04 NOTE — Progress Notes (Signed)
SITUATION ?Reason for Consult: Evaluation for Bilateral Custom Foot Orthoses ?Patient / Caregiver Report: Patient is ready for foot orthotics ? ?OBJECTIVE DATA: ?Patient History / Diagnosis:  ?  ICD-10-CM   ?1. Plantar fasciitis  M72.2   ?  ? ? ?Current or Previous Devices:   Various prefab ? ?Foot Examination: ?Skin presentation:   Intact ?Ulcers & Callousing:   None and no history ?Toe / Foot Deformities:  Pes cavus ?Weight Bearing Presentation:  Cavus ?Sensation:    Intact ? ?Shoe Size:    8.16M ? ?ORTHOTIC RECOMMENDATION ?Recommended Device: 1x pair of custom functional foot orthotics ? ?GOALS OF ORTHOSES ?- Reduce Pain ?- Prevent Foot Deformity ?- Prevent Progression of Further Foot Deformity ?- Relieve Pressure ?- Improve the Overall Biomechanical Function of the Foot and Lower Extremity. ? ?ACTIONS PERFORMED ?Potential out of pocket cost was communicated to patient. Patient understood and consent to casting. Patient was casted for Foot Orthoses via crush box. Procedure was explained and patient tolerated procedure well. Casts were shipped to central fabrication. All questions were answered and concerns addressed. ? ?PLAN ?Patient is to be called for fitting when devices are ready.  ? ? ?

## 2022-03-07 DIAGNOSIS — M545 Low back pain, unspecified: Secondary | ICD-10-CM | POA: Diagnosis not present

## 2022-03-07 DIAGNOSIS — M79661 Pain in right lower leg: Secondary | ICD-10-CM | POA: Diagnosis not present

## 2022-03-07 DIAGNOSIS — M79605 Pain in left leg: Secondary | ICD-10-CM | POA: Diagnosis not present

## 2022-03-11 ENCOUNTER — Other Ambulatory Visit: Payer: Self-pay

## 2022-03-17 ENCOUNTER — Other Ambulatory Visit: Payer: Self-pay

## 2022-03-18 ENCOUNTER — Other Ambulatory Visit: Payer: Self-pay

## 2022-03-28 DIAGNOSIS — G5731 Lesion of lateral popliteal nerve, right lower limb: Secondary | ICD-10-CM | POA: Diagnosis not present

## 2022-03-28 DIAGNOSIS — G5732 Lesion of lateral popliteal nerve, left lower limb: Secondary | ICD-10-CM | POA: Diagnosis not present

## 2022-03-28 DIAGNOSIS — G6289 Other specified polyneuropathies: Secondary | ICD-10-CM | POA: Diagnosis not present

## 2022-03-28 DIAGNOSIS — M79604 Pain in right leg: Secondary | ICD-10-CM | POA: Diagnosis not present

## 2022-03-30 ENCOUNTER — Ambulatory Visit (INDEPENDENT_AMBULATORY_CARE_PROVIDER_SITE_OTHER): Payer: 59 | Admitting: Licensed Clinical Social Worker

## 2022-03-30 DIAGNOSIS — F4310 Post-traumatic stress disorder, unspecified: Secondary | ICD-10-CM | POA: Diagnosis not present

## 2022-03-30 NOTE — Plan of Care (Signed)
?  Problem: Reduce the negative impact trauma related symptoms have on social, occupational, and family functioning. Goal: LTG: Reduce frequency, intensity, and duration of PTSD symptoms so daily functioning is improved: Input needed on appropriate metric.  Pt self report. Outcome: Progressing Goal: STG: Practice interpersonal effectiveness skills 7 times per week for the next 16 weeks Outcome: Progressing   

## 2022-03-30 NOTE — Progress Notes (Signed)
? ?  THERAPIST PROGRESS NOTE ? ?Session Time: 4-5p ? ?[name] in office visit for patient and LCSW clinician ? ?Participation Level: Active ? ?Behavioral Response: Neat and Well GroomedAlertAnxious and Euthymic ? ?Type of Therapy: Individual Therapy ? ?Treatment Goals addressed: Problem: Reduce the negative impact trauma related symptoms have on social, occupational, and family functioning. ? ?Goal: LTG: Reduce frequency, intensity, and duration of PTSD symptoms so daily functioning is improved: Input needed on appropriate metric.  Pt self report. ?Outcome: Progressing ? ?Goal: STG: Practice interpersonal effectiveness skills 7 times per week for the next 16 weeks ?Outcome: Progressing ? ?ProgressTowards Goals: Progressing ? ?Interventions: CBT and Other: trauma focused ? ?Intervention: REVIEW PLEASE SKILLS (TREAT PHYSICAL ILLNESS, BALANCE EATING, AVOID MOOD-ALTERING SUBSTANCES, BALANCE SLEEP AND GET EXERCISE) WITH Nichole Brown ? ?Intervention: Encourage verbalization of feelings/concerns/expectations ? ?Intervention: Encourage patient to set small goals for self ? ?Interventions: psychoeducational: "[employer] Creating a Healthier Life" ? ?Summary: Nichole Brown is a 56 y.o. female who presents with improving symptoms related to PTSD/anxiety. Pt reporting good quality and quantity of sleep  ?  ?Allowed pt to explore and express thoughts and feelings associated with recent life situations and external stressors.  Patient reports recent stress associated with relationship with her stepdaughter. Patient reports that stepdaughter has been making demands that are triggering stress in patient.  ? ?Reviewed importance of setting boundaries, setting limits, and sticking with them. allowed patient to brainstorm different boundaries and limitations that can be set for this particular situation.  ? ?Allowed patient to explore relationship with stepdaughter at length. Patient reports that the relationship with stepdaughter is not  impacting her relationship with her husband--he is also aware of concerning behaviors and is fully supportive of patient and her perspective. Patient and her husband are working together to set limits and boundaries with the stepdaughter.  ?  ?Continued recommendations are as follows: self care behaviors, positive social engagements, focusing on overall work/home/life balance, and focusing on positive physical and emotional wellness.  ? ?Suicidal/Homicidal: No ? ?Therapist Response: Pt is continuing to apply interventions learned in session into daily life situations. Pt is currently on track to meet goals utilizing interventions mentioned above. Personal growth and progress noted. Treatment to continue as indicated.  ? ?Plan: Return again in 4 weeks. ? ?Diagnosis: PTSD (post-traumatic stress disorder) ? ?Collaboration of Care: Other Pt encouraged to continue psychiatric care with Dr. Modesta Messing ? ?Patient/Guardian was advised Release of Information must be obtained prior to any record release in order to collaborate their care with an outside provider. Patient/Guardian was advised if they have not already done so to contact the registration department to sign all necessary forms in order for [place] to release information regarding their care.  ? ?Consent: Patient/Guardian gives verbal consent for treatment and assignment of benefits for services provided during this visit. Patient/Guardian expressed understanding and agreed to proceed.  ? ?Phoua Hoadley R Catalina Salasar, LCSW ?03/30/2022 ? ?

## 2022-04-04 DIAGNOSIS — Z6828 Body mass index (BMI) 28.0-28.9, adult: Secondary | ICD-10-CM | POA: Diagnosis not present

## 2022-04-04 DIAGNOSIS — R7303 Prediabetes: Secondary | ICD-10-CM | POA: Diagnosis not present

## 2022-04-04 DIAGNOSIS — M722 Plantar fascial fibromatosis: Secondary | ICD-10-CM | POA: Diagnosis not present

## 2022-04-04 DIAGNOSIS — M549 Dorsalgia, unspecified: Secondary | ICD-10-CM | POA: Diagnosis not present

## 2022-04-07 ENCOUNTER — Other Ambulatory Visit: Payer: 59

## 2022-04-11 ENCOUNTER — Other Ambulatory Visit: Payer: Self-pay

## 2022-04-11 DIAGNOSIS — G6289 Other specified polyneuropathies: Secondary | ICD-10-CM | POA: Diagnosis not present

## 2022-04-11 DIAGNOSIS — M542 Cervicalgia: Secondary | ICD-10-CM | POA: Diagnosis not present

## 2022-04-11 DIAGNOSIS — R200 Anesthesia of skin: Secondary | ICD-10-CM | POA: Diagnosis not present

## 2022-04-11 DIAGNOSIS — G43709 Chronic migraine without aura, not intractable, without status migrainosus: Secondary | ICD-10-CM | POA: Diagnosis not present

## 2022-04-11 MED ORDER — GABAPENTIN 100 MG PO CAPS
ORAL_CAPSULE | ORAL | 11 refills | Status: DC
  Filled 2022-04-11: qty 60, 30d supply, fill #0

## 2022-04-15 ENCOUNTER — Ambulatory Visit (INDEPENDENT_AMBULATORY_CARE_PROVIDER_SITE_OTHER): Payer: 59

## 2022-04-15 DIAGNOSIS — M722 Plantar fascial fibromatosis: Secondary | ICD-10-CM

## 2022-04-15 NOTE — Progress Notes (Signed)

## 2022-04-20 DIAGNOSIS — G5730 Lesion of lateral popliteal nerve, unspecified lower limb: Secondary | ICD-10-CM | POA: Diagnosis not present

## 2022-05-03 DIAGNOSIS — E7800 Pure hypercholesterolemia, unspecified: Secondary | ICD-10-CM | POA: Diagnosis not present

## 2022-05-03 DIAGNOSIS — E785 Hyperlipidemia, unspecified: Secondary | ICD-10-CM | POA: Diagnosis not present

## 2022-05-03 DIAGNOSIS — E663 Overweight: Secondary | ICD-10-CM | POA: Diagnosis not present

## 2022-05-03 DIAGNOSIS — L570 Actinic keratosis: Secondary | ICD-10-CM | POA: Diagnosis not present

## 2022-05-03 DIAGNOSIS — R5383 Other fatigue: Secondary | ICD-10-CM | POA: Diagnosis not present

## 2022-05-03 DIAGNOSIS — E039 Hypothyroidism, unspecified: Secondary | ICD-10-CM | POA: Diagnosis not present

## 2022-05-03 DIAGNOSIS — Z6828 Body mass index (BMI) 28.0-28.9, adult: Secondary | ICD-10-CM | POA: Diagnosis not present

## 2022-05-03 DIAGNOSIS — D239 Other benign neoplasm of skin, unspecified: Secondary | ICD-10-CM | POA: Diagnosis not present

## 2022-05-03 DIAGNOSIS — L218 Other seborrheic dermatitis: Secondary | ICD-10-CM | POA: Diagnosis not present

## 2022-05-03 DIAGNOSIS — D519 Vitamin B12 deficiency anemia, unspecified: Secondary | ICD-10-CM | POA: Diagnosis not present

## 2022-05-03 DIAGNOSIS — E559 Vitamin D deficiency, unspecified: Secondary | ICD-10-CM | POA: Diagnosis not present

## 2022-05-03 DIAGNOSIS — R7303 Prediabetes: Secondary | ICD-10-CM | POA: Diagnosis not present

## 2022-05-03 DIAGNOSIS — Z1283 Encounter for screening for malignant neoplasm of skin: Secondary | ICD-10-CM | POA: Diagnosis not present

## 2022-05-13 ENCOUNTER — Ambulatory Visit: Payer: 59

## 2022-05-13 DIAGNOSIS — M722 Plantar fascial fibromatosis: Secondary | ICD-10-CM

## 2022-05-13 NOTE — Progress Notes (Signed)
SITUATION Reason for Consult: Follow-up with foot orthotics Patient / Caregiver Report: Patient needs a softer device, she cannot tolerate for more than four hours  OBJECTIVE DATA History / Diagnosis:    ICD-10-CM   1. Plantar fasciitis  M72.2       Change in Pathology: None  ACTIONS PERFORMED Patient's equipment was checked for structural stability and fit. Devices returned to manufacturer for refabrication. All questions answered and concerns addressed.  PLAN Follow-up as needed (PRN). Plan of care discussed with and agreed upon by patient / caregiver.

## 2022-05-17 DIAGNOSIS — G5730 Lesion of lateral popliteal nerve, unspecified lower limb: Secondary | ICD-10-CM | POA: Diagnosis not present

## 2022-05-18 ENCOUNTER — Ambulatory Visit (INDEPENDENT_AMBULATORY_CARE_PROVIDER_SITE_OTHER): Payer: 59 | Admitting: Licensed Clinical Social Worker

## 2022-05-18 DIAGNOSIS — F4310 Post-traumatic stress disorder, unspecified: Secondary | ICD-10-CM

## 2022-05-19 NOTE — Plan of Care (Signed)
  Problem: Reduce the negative impact trauma related symptoms have on social, occupational, and family functioning. Goal: LTG: Reduce frequency, intensity, and duration of PTSD symptoms so daily functioning is improved: Input needed on appropriate metric.  Pt self report. Outcome: Progressing Goal: STG: Practice interpersonal effectiveness skills 7 times per week for the next 16 weeks Outcome: Progressing Intervention: REVIEW PLEASE SKILLS (TREAT PHYSICAL ILLNESS, BALANCE EATING, AVOID MOOD-ALTERING SUBSTANCES, BALANCE SLEEP AND GET EXERCISE) WITH [place] Note: reviewed Intervention: Encourage verbalization of feelings/concerns/expectations Note: Allowed/explored/reviewed coping skills for managing Intervention: Encourage patient to set small goals for self Note: Continued

## 2022-05-19 NOTE — Progress Notes (Signed)
   THERAPIST PROGRESS NOTE  Session Time: 4-5p  [name] in office visit for patient and LCSW clinician  Participation Level: Active  Behavioral Response: Neat and Well GroomedAlertAnxious and Euthymic  Type of Therapy: Individual Therapy  Treatment Goals addressed: Problem: Reduce the negative impact trauma related symptoms have on social, occupational, and family functioning. Goal: LTG: Reduce frequency, intensity, and duration of PTSD symptoms so daily functioning is improved: Input needed on appropriate metric.  Pt self report. Outcome: Progressing Goal: STG: Practice interpersonal effectiveness skills 7 times per week for the next 16 weeks Outcome: Progressing Intervention: REVIEW PLEASE SKILLS (TREAT PHYSICAL ILLNESS, BALANCE EATING, AVOID MOOD-ALTERING SUBSTANCES, BALANCE SLEEP AND GET EXERCISE) WITH Nichole Brown Note: reviewed Intervention: Encourage verbalization of feelings/concerns/expectations Note: Allowed/explored/reviewed coping skills for managing Intervention: Encourage patient to set small goals for self Note: Continued    ProgressTowards Goals: Progressing  Interventions: CBT and Other: trauma focused  Summary: Nichole Brown is a 56 y.o. female who presents with improving symptoms related to PTSD/anxiety. Pt reporting good quality and quantity of sleep    Allowed pt to explore and express thoughts and feelings associated with recent life situations and external stressors. Allowed patient to explore relationship with daughter and relationship with stepdaughter. Allowed patient to explore her personal thoughts and feelings associated with these relationships, and overall psychological impact. Explore relationship with husband, and allowed patient to explore plans that they have for traveling in the next few months. Patient reports that she continues to have stress and anxiety associated with her husband's health and his choice to not take medications, but patient also  respects his choices.  Discussed upcoming anniversary of son's death, and where patient is in the grieving process. Patient reports that she plans a trip to the beach to help her manage her grief at that point in time.  Reviewed coping skills for managing depression symptoms and stress/anxiety. Patient reflects understanding and is cooperative and compliant    Continued recommendations are as follows: self care behaviors, positive social engagements, focusing on overall work/home/life balance, and focusing on positive physical and emotional wellness.   Suicidal/Homicidal: No  Therapist Response: Pt is continuing to apply interventions learned in session into daily life situations. Pt is currently on track to meet goals utilizing interventions mentioned above. Personal growth and progress noted. Treatment to continue as indicated.   Plan: Return again in 4 weeks.  Diagnosis: No diagnosis found.  Collaboration of Care: Other Pt encouraged to continue psychiatric care with Dr. Modesta Messing  Patient/Guardian was advised Release of Information must be obtained prior to any record release in order to collaborate their care with an outside provider. Patient/Guardian was advised if they have not already done so to contact the registration department to sign all necessary forms in order for [place] to release information regarding their care.   Consent: Patient/Guardian gives verbal consent for treatment and assignment of benefits for services provided during this visit. Patient/Guardian expressed understanding and agreed to proceed.   [place], LCSW 05/19/2022

## 2022-05-26 ENCOUNTER — Other Ambulatory Visit: Payer: Self-pay

## 2022-05-27 ENCOUNTER — Ambulatory Visit: Payer: 59 | Admitting: Podiatry

## 2022-05-27 ENCOUNTER — Other Ambulatory Visit: Payer: Self-pay

## 2022-06-02 ENCOUNTER — Other Ambulatory Visit: Payer: Self-pay

## 2022-06-02 DIAGNOSIS — R7303 Prediabetes: Secondary | ICD-10-CM | POA: Diagnosis not present

## 2022-06-02 DIAGNOSIS — N899 Noninflammatory disorder of vagina, unspecified: Secondary | ICD-10-CM | POA: Diagnosis not present

## 2022-06-02 DIAGNOSIS — N760 Acute vaginitis: Secondary | ICD-10-CM | POA: Diagnosis not present

## 2022-06-02 MED ORDER — METRONIDAZOLE 500 MG PO TABS
ORAL_TABLET | ORAL | 0 refills | Status: DC
  Filled 2022-06-02: qty 14, 7d supply, fill #0

## 2022-06-03 ENCOUNTER — Other Ambulatory Visit: Payer: Self-pay | Admitting: Family Medicine

## 2022-06-03 DIAGNOSIS — Z1231 Encounter for screening mammogram for malignant neoplasm of breast: Secondary | ICD-10-CM

## 2022-06-06 ENCOUNTER — Other Ambulatory Visit: Payer: Self-pay

## 2022-06-07 DIAGNOSIS — M722 Plantar fascial fibromatosis: Secondary | ICD-10-CM | POA: Diagnosis not present

## 2022-06-08 ENCOUNTER — Other Ambulatory Visit: Payer: Self-pay

## 2022-06-08 MED ORDER — LORAZEPAM 1 MG PO TABS
ORAL_TABLET | ORAL | 0 refills | Status: DC
  Filled 2022-06-08: qty 6, 6d supply, fill #0

## 2022-06-17 ENCOUNTER — Other Ambulatory Visit: Payer: Self-pay

## 2022-06-17 MED ORDER — METRONIDAZOLE 500 MG PO TABS
ORAL_TABLET | ORAL | 0 refills | Status: DC
  Filled 2022-06-17: qty 14, 7d supply, fill #0

## 2022-06-24 ENCOUNTER — Ambulatory Visit (INDEPENDENT_AMBULATORY_CARE_PROVIDER_SITE_OTHER): Payer: 59 | Admitting: Podiatry

## 2022-06-24 DIAGNOSIS — M722 Plantar fascial fibromatosis: Secondary | ICD-10-CM

## 2022-06-24 NOTE — Progress Notes (Signed)
Patient presents to pick up modified orthotics.  Wearing instructions were given.  Patient acknowledged understanding of the instructions.  She was advised to return as needed.

## 2022-07-06 ENCOUNTER — Telehealth (HOSPITAL_COMMUNITY): Payer: Self-pay | Admitting: Licensed Clinical Social Worker

## 2022-07-06 ENCOUNTER — Ambulatory Visit (INDEPENDENT_AMBULATORY_CARE_PROVIDER_SITE_OTHER): Payer: Self-pay | Admitting: Licensed Clinical Social Worker

## 2022-07-06 DIAGNOSIS — Z91199 Patient's noncompliance with other medical treatment and regimen due to unspecified reason: Secondary | ICD-10-CM

## 2022-07-06 NOTE — Telephone Encounter (Addendum)
LCSW counselor tried to connect with patient for scheduled appointment via MyChart video text request x 2 and email request; also tried to connect via phone without success. LCSW counselor left message for patient to call office number to reschedule OPT appointment.    Attempt 1: Text and email:4:03p   Attempt 2: Text and email: 4:12p   Attempt 3: phone call: 4:16p--could not leave message due to mailbox being full   Visit will be coded as NS

## 2022-07-06 NOTE — Progress Notes (Signed)
LCSW counselor tried to connect with patient for scheduled appointment via MyChart video text request x 2 and email request; also tried to connect via phone without success. LCSW counselor left message for patient to call office number to reschedule OPT appointment.   Attempt 1: Text and email:4:03p  Attempt 2: Text and email: 4:12p  Attempt 3: phone call: 4:16p--could not leave message due to mailbox being full  Visit will be coded as NS

## 2022-07-08 ENCOUNTER — Ambulatory Visit: Payer: 59 | Admitting: Podiatry

## 2022-07-08 DIAGNOSIS — M722 Plantar fascial fibromatosis: Secondary | ICD-10-CM

## 2022-07-08 NOTE — Progress Notes (Signed)
Chief Complaint  Patient presents with   Follow-up    Follow-up after inserts     Subjective: 56 y.o. female presenting today for follow-up evaluation of right heel pain and tightness to the lateral aspect of the right lower extremity.  Patient states that the orthotics are helping.  She has only been wearing them for about 2 weeks however.  She continues to have some right leg pain as well as heel pain.   Past Medical History:  Diagnosis Date   Anxiety    Bronchitis    Complication of anesthesia    PONV   Depression    Fibromyalgia 2005   GERD (gastroesophageal reflux disease)    Insomnia    Migraine headache    Pneumonia    PONV (postoperative nausea and vomiting)    Past Surgical History:  Procedure Laterality Date   ABDOMINAL HYSTERECTOMY     BALLOON DILATION N/A 12/14/2020   Procedure: BALLOON DILATION;  Surgeon: Eloise Harman, DO;  [facility];  Service: Endoscopy;  Laterality: N/A;   BIOPSY  12/14/2020   Procedure: BIOPSY;  Surgeon: Eloise Harman, DO;  [facility];  Service: Endoscopy;;   CESAREAN SECTION     CHOLECYSTECTOMY  1988   COLONOSCOPY N/A 12/10/2018    six 4-8 mm polyps in proximal descending colon, mid transverse and hepatic flexure. Diverticulosis. External and internal hemorrhoids. Torturous colon. Two simple adenomas, two serrated polyps, 2 hyperplastic polyps. Surveillance due Dec 2022.    ESOPHAGOGASTRODUODENOSCOPY (EGD) WITH PROPOFOL N/A 12/14/2020   Benign-appearing esophageal stenosis s/p dilation, gastritis, normal duodenum. Mild chronic gastritis.    open heart surgery  1967   PATENT DUCTUS ARTERIOUS REPAIR  1967   POLYPECTOMY  12/10/2018   Procedure: POLYPECTOMY;  Surgeon: Danie Binder, MD;  [facility];  Service: Endoscopy;;  hepatic flexure, descending, transverse   SHOULDER ARTHROSCOPY Left 06/16/2017   Procedure: LEFT SHOULDER ARTHROSCOPY, DEBRIDEMENT, AND DECOMPRESSION;  Surgeon: Newt Minion,  MD;  Location: [place];  Service: Orthopedics;  Laterality: Left;   SHOULDER ARTHROSCOPY WITH SUBACROMIAL DECOMPRESSION Left 10/03/2019   Procedure: SHOULDER ARTHROSCOPY WITH DEBRIDEMENT, DECOMPRESSION;  Surgeon: Corky Mull, MD;  Location: [facility];  Service: Orthopedics;  Laterality: Left;   SHOULDER SURGERY Left 2008   AC joint   Allergies  Allergen Reactions   Cymbalta [Duloxetine Hcl] Other (See Comments)    Hallucinations; sleep walking   Nortriptyline Nausea And Vomiting   Phenergan [Promethazine Hcl]     Restless legs and cramping   Topiramate Other (See Comments)    Dry eyes, visual changes; advised to stop med by her optometrist Dry eyes, visual changes; advised to stop med by her optometrist - update patient eye dr said OK to use     Objective: Physical Exam General: The patient is alert and oriented x3 in no acute distress.  Dermatology: Skin is warm, dry and supple bilateral lower extremities. Negative for open lesions or macerations bilateral.   Vascular: Dorsalis Pedis and Posterior Tibial pulses palpable bilateral.  Capillary fill time is immediate to all digits.  Neurological: Epicritic and protective threshold intact bilateral.   Musculoskeletal: There continues to be some tenderness to palpation to the plantar aspect of the right heel along the plantar fascia. All other joints range of motion within normal limits bilateral. Strength 5/5 in all groups bilateral.   Assessment: 1. Plantar fasciitis right 2.  Right lateral leg pain possibly due to compensation  Plan  of Care:  1. Patient evaluated.  2.  Patient states that the orthotics are comfortable and she has been wearing them daily for the last 2 weeks.  Continue. 3.  Patient has received multiple injections to the right plantar heel.  She declined any steroid injections today or prescription for anti-inflammatories 4.  Advised against going barefoot.  Recommend good supportive shoes and sneakers with her  orthotics 5.  Return to clinic as needed  *Phlebotomist at [facility] outpatient   Edrick Kins, DPM [facility]  Dr. Edrick Kins, DPM    [address], [place] [address]                Office [phone]  Fax [phone]

## 2022-07-11 ENCOUNTER — Ambulatory Visit
Admission: RE | Admit: 2022-07-11 | Discharge: 2022-07-11 | Disposition: A | Discharge status: Home / Self Care | Payer: 59 | Source: Ambulatory Visit | Attending: Family Medicine | Admitting: Family Medicine

## 2022-07-11 DIAGNOSIS — Z1231 Encounter for screening mammogram for malignant neoplasm of breast: Secondary | ICD-10-CM | POA: Diagnosis not present

## 2022-07-12 ENCOUNTER — Other Ambulatory Visit: Payer: Self-pay

## 2022-07-12 ENCOUNTER — Encounter: Payer: Self-pay | Admitting: Obstetrics & Gynecology

## 2022-07-12 ENCOUNTER — Ambulatory Visit: Payer: 59 | Admitting: Obstetrics & Gynecology

## 2022-07-12 VITALS — BP 94/63 | HR 107 | Ht 62.0 in | Wt 162.0 lb

## 2022-07-12 DIAGNOSIS — Z1329 Encounter for screening for other suspected endocrine disorder: Secondary | ICD-10-CM | POA: Diagnosis not present

## 2022-07-12 DIAGNOSIS — N951 Menopausal and female climacteric states: Secondary | ICD-10-CM | POA: Diagnosis not present

## 2022-07-12 DIAGNOSIS — N952 Postmenopausal atrophic vaginitis: Secondary | ICD-10-CM

## 2022-07-12 DIAGNOSIS — N930 Postcoital and contact bleeding: Secondary | ICD-10-CM | POA: Diagnosis not present

## 2022-07-12 MED ORDER — ESTRADIOL 0.1 MG/GM VA CREA
TOPICAL_CREAM | VAGINAL | 12 refills | Status: DC
  Filled 2022-07-12: qty 42.5, 90d supply, fill #0

## 2022-07-12 NOTE — Progress Notes (Signed)
Chief Complaint  Patient presents with   re-establsh gyn    Bleeding after sex,saw family doctor given 2 round antibiotic,happen a month ago      56 y.o. No obstetric history on file. No LMP recorded. Patient has had a hysterectomy. The current method of family planning is [facility] BSO.  Outpatient Encounter Medications as of 07/12/2022  Medication Sig   Cholecalciferol (VITAMIN D) 50 MCG (2000 UT) CAPS Take by mouth daily.   estradiol (ESTRACE VAGINAL) 0.1 MG/GM vaginal cream Use 1 gram twice a week   Lifitegrast (XIIDRA) 5 % SOLN Instill 1 drop OU bid.   ondansetron (ZOFRAN-ODT) 4 MG disintegrating tablet Take 1 tablet (4 mg total) by mouth every 8 (eight) hours as needed for nausea or vomiting.   rizatriptan (MAXALT-MLT) 10 MG disintegrating tablet Take 1 tablet (10 mg total) by mouth once as needed (may repeat dose once in > 2 hrs) for up to 1 dose   topiramate (TOPAMAX) 25 MG tablet Take 1 tablet (25 mg total) by mouth 2 (two) times daily   albuterol (VENTOLIN HFA) 108 (90 Base) MCG/ACT inhaler Inhale 2 puffs into the lungs 4 (four) times daily as needed for wheezing or shortness of breath. (Patient not taking: Reported on 07/12/2022)   amoxicillin-clavulanate (AUGMENTIN) 500-125 MG tablet Take 1 tablet by mouth twice a day (Patient not taking: Reported on 07/12/2022)   etodolac (LODINE) 500 MG tablet Take 1 tablet (500 mg total) by mouth 2 (two) times daily for 30 days (Patient not taking: Reported on 07/12/2022)   famotidine (PEPCID) 20 MG tablet  (Patient not taking: Reported on 07/12/2022)   gabapentin (NEURONTIN) 100 MG capsule Take by mouth. (Patient not taking: Reported on 07/12/2022)   LORazepam (ATIVAN) 1 MG tablet TAKE ONE TAB BEFORE BEDTIME. TAKE SECOND TAB 2 HOURS BEFORE DENTAL APPT. MUST HAVE DRIVER TO AND FROM APPT.   meloxicam (MOBIC) 7.5 MG tablet Take 1 tablet every day by oral route. (Patient not taking: Reported on 07/12/2022)   metroNIDAZOLE (FLAGYL) 500 MG tablet 1 tab every  twelve hours (Patient not taking: Reported on 07/12/2022)   NEOMYCIN-POLYMYXIN-HYDROCORTISONE (CORTISPORIN) 1 % SOLN OTIC solution PLACE 4 DROPS TO THE AFFECTED EAR/EARS 3 TIMES A DAY FOR 7 DAYS. (Patient not taking: Reported on 07/12/2022)   ondansetron (ZOFRAN-ODT) 4 MG disintegrating tablet Take 1 tablet (4 mg total) by mouth every 8 (eight) hours as needed for Nausea migraine (Patient not taking: Reported on 07/12/2022)   ondansetron (ZOFRAN-ODT) 4 MG disintegrating tablet Take 1 tablet (4 mg total) by mouth every 8 (eight) hours as needed for Nausea migraine (Patient not taking: Reported on 07/12/2022)   predniSONE (DELTASONE) 10 MG tablet tabs take as directed (Patient not taking: Reported on 07/12/2022)   rizatriptan (MAXALT-MLT) 10 MG disintegrating tablet May take a second dose after 2 hours if needed. (Patient not taking: Reported on 07/12/2022)   XIIDRA 5 % SOLN Place 1 drop into both eyes 2 (two) times daily. (Patient not taking: Reported on 07/12/2022)   [DISCONTINUED] sucralfate (CARAFATE) 1 GM/10ML suspension Take 10 mLs (1 g total) by mouth 4 (four) times daily -  with meals and at bedtime. (Patient not taking: Reported on 12/07/2020)   No facility-administered encounter medications on file as of 07/12/2022.    Subjective Pt with an episode of PCB Only once Had some odor  Cleared up with metronidazole x 2  Past Medical History:  Diagnosis Date   Anxiety    Bronchitis  Complication of anesthesia    PONV   Depression    Fibromyalgia 2005   GERD (gastroesophageal reflux disease)    Insomnia    Migraine headache    Pneumonia    PONV (postoperative nausea and vomiting)     Past Surgical History:  Procedure Laterality Date   ABDOMINAL HYSTERECTOMY     BALLOON DILATION N/A 12/14/2020   Procedure: BALLOON DILATION;  Surgeon: Eloise Harman, DO;  [facility];  Service: Endoscopy;  Laterality: N/A;   BIOPSY  12/14/2020   Procedure: BIOPSY;  Surgeon: Eloise Harman, DO;   [facility];  Service: Endoscopy;;   CESAREAN SECTION     CHOLECYSTECTOMY  1988   COLONOSCOPY N/A 12/10/2018    six 4-8 mm polyps in proximal descending colon, mid transverse and hepatic flexure. Diverticulosis. External and internal hemorrhoids. Torturous colon. Two simple adenomas, two serrated polyps, 2 hyperplastic polyps. Surveillance due Dec 2022.    ESOPHAGOGASTRODUODENOSCOPY (EGD) WITH PROPOFOL N/A 12/14/2020   Benign-appearing esophageal stenosis s/p dilation, gastritis, normal duodenum. Mild chronic gastritis.    open heart surgery  1967   PATENT DUCTUS ARTERIOUS REPAIR  1967   POLYPECTOMY  12/10/2018   Procedure: POLYPECTOMY;  Surgeon: Danie Binder, MD;  [facility];  Service: Endoscopy;;  hepatic flexure, descending, transverse   SHOULDER ARTHROSCOPY Left 06/16/2017   Procedure: LEFT SHOULDER ARTHROSCOPY, DEBRIDEMENT, AND DECOMPRESSION;  Surgeon: Newt Minion, MD;  Location: [place];  Service: Orthopedics;  Laterality: Left;   SHOULDER ARTHROSCOPY WITH SUBACROMIAL DECOMPRESSION Left 10/03/2019   Procedure: SHOULDER ARTHROSCOPY WITH DEBRIDEMENT, DECOMPRESSION;  Surgeon: Corky Mull, MD;  Location: [facility];  Service: Orthopedics;  Laterality: Left;   SHOULDER SURGERY Left 2008   AC joint    OB History   No obstetric history on file.     Allergies  Allergen Reactions   Cymbalta [Duloxetine Hcl] Other (See Comments)    Hallucinations; sleep walking   Nortriptyline Nausea And Vomiting   Phenergan [Promethazine Hcl]     Restless legs and cramping    Social History   Socioeconomic History   Marital status: Married    Spouse name: daniel   Number of children: 4   Years of education: Not on file   Highest education level: Some college, no degree  Occupational History   Not on file  Tobacco Use   Smoking status: Former   Smokeless tobacco: Never  Scientific laboratory technician Use: Never used  Substance and Sexual Activity   Alcohol use: Yes     Comment: occassionally   Drug use: No   Sexual activity: Yes    Birth control/protection: Surgical  Other Topics Concern   Not on file  Social History Narrative   Not on file   Social Determinants of Health   Financial Resource Strain: Low Risk  (07/12/2022)   Overall Financial Resource Strain (CARDIA)    Difficulty of Paying Living Expenses: Not hard at all  Food Insecurity: No Food Insecurity (07/12/2022)   Hunger Vital Sign    Worried About Running Out of Food in the Last Year: Never true    [place] in the Last Year: Never true  Transportation Needs: No Transportation Needs (07/12/2022)   PRAPARE - Hydrologist (Medical): No    Lack of Transportation (Non-Medical): No  Physical Activity: Insufficiently Active (07/12/2022)   Exercise Vital Sign    Days of Exercise per Week:  3 days    Minutes of Exercise per Session: 30 min  Stress: Stress Concern Present (07/12/2022)   [place]    Feeling of Stress : To some extent  Social Connections: Moderately Integrated (07/12/2022)   Social Connection and Isolation Panel [NHANES]    Frequency of Communication with Friends and Family: Once a week    Frequency of Social Gatherings with Friends and Family: Once a week    Attends Religious Services: More than 4 times per year    Active Member of [employer] or Organizations: Yes    Attends Music therapist: More than 4 times per year    Marital Status: Married    Family History  Problem Relation Age of Onset   Hypertension Maternal Grandmother    Diabetes Maternal Grandmother    Heart disease Maternal Grandmother 46       MI   Hypertension Maternal Grandfather    Throat cancer Maternal Grandfather    Breast cancer Mother 20   Hypertension Father    Colon cancer Neg Hx     Medications:       Current Outpatient Medications:    Cholecalciferol (VITAMIN D) 50 MCG (2000 UT) CAPS, Take  by mouth daily., Disp: , Rfl:    estradiol (ESTRACE VAGINAL) 0.1 MG/GM vaginal cream, Use 1 gram twice a week, Disp: 42.5 g, Rfl: 12   Lifitegrast (XIIDRA) 5 % SOLN, Instill 1 drop OU bid., Disp: 180 each, Rfl: 4   ondansetron (ZOFRAN-ODT) 4 MG disintegrating tablet, Take 1 tablet (4 mg total) by mouth every 8 (eight) hours as needed for nausea or vomiting., Disp: 20 tablet, Rfl: 0   rizatriptan (MAXALT-MLT) 10 MG disintegrating tablet, Take 1 tablet (10 mg total) by mouth once as needed (may repeat dose once in > 2 hrs) for up to 1 dose, Disp: 10 tablet, Rfl: 11   topiramate (TOPAMAX) 25 MG tablet, Take 1 tablet (25 mg total) by mouth 2 (two) times daily, Disp: 60 tablet, Rfl: 11   albuterol (VENTOLIN HFA) 108 (90 Base) MCG/ACT inhaler, Inhale 2 puffs into the lungs 4 (four) times daily as needed for wheezing or shortness of breath. (Patient not taking: Reported on 07/12/2022), Disp: 18 g, Rfl: 2   amoxicillin-clavulanate (AUGMENTIN) 500-125 MG tablet, Take 1 tablet by mouth twice a day (Patient not taking: Reported on 07/12/2022), Disp: 14 tablet, Rfl: 0   etodolac (LODINE) 500 MG tablet, Take 1 tablet (500 mg total) by mouth 2 (two) times daily for 30 days (Patient not taking: Reported on 07/12/2022), Disp: 60 tablet, Rfl: 1   famotidine (PEPCID) 20 MG tablet, , Disp: , Rfl:    gabapentin (NEURONTIN) 100 MG capsule, Take by mouth. (Patient not taking: Reported on 07/12/2022), Disp: 60 capsule, Rfl: 11   LORazepam (ATIVAN) 1 MG tablet, TAKE ONE TAB BEFORE BEDTIME. TAKE SECOND TAB 2 HOURS BEFORE DENTAL APPT. MUST HAVE DRIVER TO AND FROM APPT., Disp: 6 tablet, Rfl: 0   meloxicam (MOBIC) 7.5 MG tablet, Take 1 tablet every day by oral route. (Patient not taking: Reported on 07/12/2022), Disp: 30 tablet, Rfl: 0   metroNIDAZOLE (FLAGYL) 500 MG tablet, 1 tab every twelve hours (Patient not taking: Reported on 07/12/2022), Disp: 14 tablet, Rfl: 0   NEOMYCIN-POLYMYXIN-HYDROCORTISONE (CORTISPORIN) 1 % SOLN OTIC solution,  PLACE 4 DROPS TO THE AFFECTED EAR/EARS 3 TIMES A DAY FOR 7 DAYS. (Patient not taking: Reported on 07/12/2022), Disp: 10 mL, Rfl: 0  ondansetron (ZOFRAN-ODT) 4 MG disintegrating tablet, Take 1 tablet (4 mg total) by mouth every 8 (eight) hours as needed for Nausea migraine (Patient not taking: Reported on 07/12/2022), Disp: 20 tablet, Rfl: 11   ondansetron (ZOFRAN-ODT) 4 MG disintegrating tablet, Take 1 tablet (4 mg total) by mouth every 8 (eight) hours as needed for Nausea migraine (Patient not taking: Reported on 07/12/2022), Disp: 20 tablet, Rfl: 11   predniSONE (DELTASONE) 10 MG tablet, tabs take as directed (Patient not taking: Reported on 07/12/2022), Disp: 21 tablet, Rfl: 0   rizatriptan (MAXALT-MLT) 10 MG disintegrating tablet, May take a second dose after 2 hours if needed. (Patient not taking: Reported on 07/12/2022), Disp: 10 tablet, Rfl: 11   XIIDRA 5 % SOLN, Place 1 drop into both eyes 2 (two) times daily. (Patient not taking: Reported on 07/12/2022), Disp: , Rfl:   Objective Blood pressure 94/63, pulse (!) 107, height '5\' 2"'$  (1.575 m), weight 162 lb (73.5 kg).  General WDWN female NAD Vulva:  normal appearing vulva with no masses, tenderness or lesions Vagina:  atrophy mucosa, no discharge Cervix:  absent Uterus:  absent Adnexa: ovaries:absent, no masses  Pertinent ROS No burning with urination, frequency or urgency No nausea, vomiting or diarrhea Nor fever chills or other constitutional symptoms         Labs or studies none    Impression + Management Plan: Diagnoses this Encounter::   ICD-10-CM   1. Atrophic vaginitis  N95.2     2. PCB (post coital bleeding)  N93.0         Medications prescribed during  this encounter: Meds ordered this encounter  Medications   estradiol (ESTRACE VAGINAL) 0.1 MG/GM vaginal cream    Sig: Use 1 gram twice a week    Dispense:  42.5 g    Refill:  12    Labs or Scans Ordered during this encounter: No orders of the defined types  were placed in this encounter.     Follow up Return if symptoms worsen or fail to improve.

## 2022-07-14 DIAGNOSIS — Z6828 Body mass index (BMI) 28.0-28.9, adult: Secondary | ICD-10-CM | POA: Diagnosis not present

## 2022-07-14 DIAGNOSIS — N951 Menopausal and female climacteric states: Secondary | ICD-10-CM | POA: Diagnosis not present

## 2022-07-14 DIAGNOSIS — N898 Other specified noninflammatory disorders of vagina: Secondary | ICD-10-CM | POA: Diagnosis not present

## 2022-07-14 DIAGNOSIS — R232 Flushing: Secondary | ICD-10-CM | POA: Diagnosis not present

## 2022-07-15 ENCOUNTER — Ambulatory Visit (INDEPENDENT_AMBULATORY_CARE_PROVIDER_SITE_OTHER): Payer: 59 | Admitting: Licensed Clinical Social Worker

## 2022-07-15 ENCOUNTER — Other Ambulatory Visit: Payer: Self-pay

## 2022-07-15 DIAGNOSIS — F4310 Post-traumatic stress disorder, unspecified: Secondary | ICD-10-CM

## 2022-07-15 NOTE — Progress Notes (Signed)
Virtual Visit via Video Note  I connected with LYNZE REDDY on 07/15/22 at 11:00 AM EDT by a video enabled telemedicine application and verified that I am speaking with the correct person using two identifiers.  Location: Patient: home Provider: remote office [place], [place])   I discussed the limitations of evaluation and management by telemedicine and the availability of in person appointments. The patient expressed understanding and agreed to proceed.  I discussed the assessment and treatment plan with the patient. The patient was provided an opportunity to ask questions and all were answered. The patient agreed with the plan and demonstrated an understanding of the instructions.   The patient was advised to call back or seek an in-person evaluation if the symptoms worsen or if the condition fails to improve as anticipated.  I provided 30 minutes of non-face-to-face time during this encounter.   Milla Wahlberg R Caterina Racine, LCSW   THERAPIST PROGRESS NOTE  Session Time: 11-1130a  Participation Level: Active  Behavioral Response: Neat and Well GroomedAlertAnxious and Euthymic  Type of Therapy: Individual Therapy  Treatment Goals addressed:  Problem: Reduce the negative impact trauma related symptoms have on social, occupational, and family functioning. Goal: LTG: Reduce frequency, intensity, and duration of PTSD symptoms so daily functioning is improved: Input needed on appropriate metric.  Pt self report. Outcome: Progressing Goal: STG: Practice interpersonal effectiveness skills 7 times per week for the next 16 weeks Outcome: Progressing    ProgressTowards Goals: Progressing  Interventions: CBT and Other: trauma focused  Summary: ANNELI BING is a 56 y.o. female who presents with improving symptoms related to PTSD/anxiety. Pt reporting good quality and quantity of sleep.  Allowed pt to explore and express thoughts and feelings associated with recent life situations and  external stressors. Pt reports that she is in the middle of some family conflict (brother?) and she has supports from multiple family members to have a confrontation about her (their) concerns. Pt reports that she feels very confident in herself and how this conversation will go. "I know that there may be some repercussions, but I'm willing to take that chance".   Pt reports that she is managing other situational stressors well and does not have any additional concerns at time of session.  Pt elected to end session early because of her work schedule.     Continued recommendations are as follows: self care behaviors, positive social engagements, focusing on overall work/home/life balance, and focusing on positive physical and emotional wellness.   Suicidal/Homicidal: No  Therapist Response: Pt is continuing to apply interventions learned in session into daily life situations. Pt is currently on track to meet goals utilizing interventions mentioned above. Personal growth and progress noted. Treatment to continue as indicated.   Plan: Return again in 4 weeks.  Diagnosis:  Encounter Diagnosis  Name Primary?   PTSD (post-traumatic stress disorder) Yes   Collaboration of Care: Other Pt encouraged to continue psychiatric care with Dr. Modesta Messing  Patient/Guardian was advised Release of Information must be obtained prior to any record release in order to collaborate their care with an outside provider. Patient/Guardian was advised if they have not already done so to contact the registration department to sign all necessary forms in order for [place] to release information regarding their care.   Consent: Patient/Guardian gives verbal consent for treatment and assignment of benefits for services provided during this visit. Patient/Guardian expressed understanding and agreed to proceed.   Lind, LCSW 07/15/2022

## 2022-07-15 NOTE — Plan of Care (Signed)
  Problem: Reduce the negative impact trauma related symptoms have on social, occupational, and family functioning. Goal: LTG: Reduce frequency, intensity, and duration of PTSD symptoms so daily functioning is improved: Input needed on appropriate metric.  Pt self report. Outcome: Progressing Goal: STG: Practice interpersonal effectiveness skills 7 times per week for the next 16 weeks Outcome: Progressing

## 2022-07-18 ENCOUNTER — Other Ambulatory Visit: Payer: Self-pay

## 2022-07-19 ENCOUNTER — Other Ambulatory Visit: Payer: Self-pay | Admitting: Family Medicine

## 2022-07-19 DIAGNOSIS — R928 Other abnormal and inconclusive findings on diagnostic imaging of breast: Secondary | ICD-10-CM

## 2022-07-19 DIAGNOSIS — N6489 Other specified disorders of breast: Secondary | ICD-10-CM

## 2022-08-03 ENCOUNTER — Other Ambulatory Visit: Payer: Self-pay

## 2022-08-03 DIAGNOSIS — M899 Disorder of bone, unspecified: Secondary | ICD-10-CM | POA: Diagnosis not present

## 2022-08-03 DIAGNOSIS — M25511 Pain in right shoulder: Secondary | ICD-10-CM | POA: Diagnosis not present

## 2022-08-03 DIAGNOSIS — M5412 Radiculopathy, cervical region: Secondary | ICD-10-CM | POA: Diagnosis not present

## 2022-08-03 DIAGNOSIS — M7581 Other shoulder lesions, right shoulder: Secondary | ICD-10-CM | POA: Diagnosis not present

## 2022-08-03 DIAGNOSIS — M542 Cervicalgia: Secondary | ICD-10-CM | POA: Diagnosis not present

## 2022-08-03 DIAGNOSIS — G5621 Lesion of ulnar nerve, right upper limb: Secondary | ICD-10-CM | POA: Diagnosis not present

## 2022-08-03 MED ORDER — PREDNISONE 10 MG PO TABS
ORAL_TABLET | ORAL | 0 refills | Status: DC
  Filled 2022-08-03: qty 42, 12d supply, fill #0

## 2022-08-03 MED ORDER — TIZANIDINE HCL 4 MG PO TABS
ORAL_TABLET | ORAL | 1 refills | Status: DC
  Filled 2022-08-03: qty 60, 20d supply, fill #0
  Filled 2022-10-12: qty 60, 20d supply, fill #1

## 2022-08-03 MED ORDER — CELECOXIB 200 MG PO CAPS
ORAL_CAPSULE | ORAL | 0 refills | Status: DC
  Filled 2022-08-03: qty 60, 30d supply, fill #0

## 2022-08-04 ENCOUNTER — Ambulatory Visit
Admission: RE | Admit: 2022-08-04 | Discharge: 2022-08-04 | Disposition: A | Discharge status: Home / Self Care | Payer: 59 | Source: Ambulatory Visit | Attending: Family Medicine | Admitting: Family Medicine

## 2022-08-04 DIAGNOSIS — R928 Other abnormal and inconclusive findings on diagnostic imaging of breast: Secondary | ICD-10-CM | POA: Diagnosis not present

## 2022-08-04 DIAGNOSIS — N6489 Other specified disorders of breast: Secondary | ICD-10-CM

## 2022-08-05 ENCOUNTER — Other Ambulatory Visit: Payer: Self-pay | Admitting: Family Medicine

## 2022-08-09 ENCOUNTER — Other Ambulatory Visit: Payer: Self-pay

## 2022-08-12 ENCOUNTER — Ambulatory Visit (HOSPITAL_COMMUNITY): Payer: 59 | Admitting: Licensed Clinical Social Worker

## 2022-08-16 ENCOUNTER — Other Ambulatory Visit: Payer: Self-pay

## 2022-08-17 ENCOUNTER — Other Ambulatory Visit: Payer: Self-pay | Admitting: Family Medicine

## 2022-08-17 DIAGNOSIS — N6489 Other specified disorders of breast: Secondary | ICD-10-CM

## 2022-08-17 DIAGNOSIS — R928 Other abnormal and inconclusive findings on diagnostic imaging of breast: Secondary | ICD-10-CM

## 2022-08-18 ENCOUNTER — Other Ambulatory Visit: Payer: Self-pay | Admitting: Adult Health

## 2022-08-18 DIAGNOSIS — R928 Other abnormal and inconclusive findings on diagnostic imaging of breast: Secondary | ICD-10-CM

## 2022-08-18 DIAGNOSIS — N6321 Unspecified lump in the left breast, upper outer quadrant: Secondary | ICD-10-CM | POA: Diagnosis not present

## 2022-08-18 DIAGNOSIS — N63 Unspecified lump in unspecified breast: Secondary | ICD-10-CM

## 2022-08-22 DIAGNOSIS — N951 Menopausal and female climacteric states: Secondary | ICD-10-CM | POA: Diagnosis not present

## 2022-08-22 DIAGNOSIS — R5383 Other fatigue: Secondary | ICD-10-CM | POA: Diagnosis not present

## 2022-08-24 ENCOUNTER — Ambulatory Visit
Admission: RE | Admit: 2022-08-24 | Discharge: 2022-08-24 | Disposition: A | Discharge status: Home / Self Care | Payer: 59 | Source: Ambulatory Visit | Attending: Adult Health | Admitting: Adult Health

## 2022-08-24 DIAGNOSIS — N63 Unspecified lump in unspecified breast: Secondary | ICD-10-CM

## 2022-08-24 DIAGNOSIS — R928 Other abnormal and inconclusive findings on diagnostic imaging of breast: Secondary | ICD-10-CM | POA: Insufficient documentation

## 2022-08-24 DIAGNOSIS — N951 Menopausal and female climacteric states: Secondary | ICD-10-CM | POA: Diagnosis not present

## 2022-08-24 DIAGNOSIS — R5383 Other fatigue: Secondary | ICD-10-CM | POA: Diagnosis not present

## 2022-08-24 DIAGNOSIS — Z6829 Body mass index (BMI) 29.0-29.9, adult: Secondary | ICD-10-CM | POA: Diagnosis not present

## 2022-08-24 DIAGNOSIS — M2550 Pain in unspecified joint: Secondary | ICD-10-CM | POA: Diagnosis not present

## 2022-08-24 DIAGNOSIS — C50412 Malignant neoplasm of upper-outer quadrant of left female breast: Secondary | ICD-10-CM | POA: Diagnosis not present

## 2022-08-24 HISTORY — PX: BREAST BIOPSY: SHX20

## 2022-08-25 ENCOUNTER — Encounter: Payer: Self-pay | Admitting: *Deleted

## 2022-08-25 NOTE — Progress Notes (Signed)
Received referral for newly diagnosed breast cancer from [facility].  Navigation initiated.  Ms. Piatt will call me back tomorrow with which MD's she would like to be referred too.

## 2022-08-26 ENCOUNTER — Encounter: Payer: Self-pay | Admitting: *Deleted

## 2022-08-26 DIAGNOSIS — C50919 Malignant neoplasm of unspecified site of unspecified female breast: Secondary | ICD-10-CM

## 2022-08-26 LAB — SURGICAL PATHOLOGY

## 2022-08-26 NOTE — Progress Notes (Signed)
Received referral for newly diagnosed breast cancer from [facility].  Navigation initiated.  Referral sent to Dr. Donne Hazel per patient request.  She will see Dr. Janese Banks 9/18 at 2:15.

## 2022-08-29 ENCOUNTER — Encounter: Payer: Self-pay | Admitting: Oncology

## 2022-08-29 ENCOUNTER — Inpatient Hospital Stay: Payer: 59 | Attending: Oncology | Admitting: Oncology

## 2022-08-29 ENCOUNTER — Encounter: Payer: Self-pay | Admitting: *Deleted

## 2022-08-29 ENCOUNTER — Inpatient Hospital Stay: Payer: 59

## 2022-08-29 VITALS — BP 108/74 | HR 86 | Temp 98.7°F | Resp 16 | Wt 166.0 lb

## 2022-08-29 DIAGNOSIS — Z170 Estrogen receptor positive status [ER+]: Secondary | ICD-10-CM | POA: Insufficient documentation

## 2022-08-29 DIAGNOSIS — Z801 Family history of malignant neoplasm of trachea, bronchus and lung: Secondary | ICD-10-CM | POA: Diagnosis not present

## 2022-08-29 DIAGNOSIS — Z803 Family history of malignant neoplasm of breast: Secondary | ICD-10-CM | POA: Insufficient documentation

## 2022-08-29 DIAGNOSIS — Z7189 Other specified counseling: Secondary | ICD-10-CM | POA: Insufficient documentation

## 2022-08-29 DIAGNOSIS — C50412 Malignant neoplasm of upper-outer quadrant of left female breast: Secondary | ICD-10-CM | POA: Insufficient documentation

## 2022-08-29 NOTE — Progress Notes (Signed)
Hematology/Oncology Consult note [facility] [phone] [phone]  Patient Care Team: Remi Haggard, FNP as PCP - General (Family Medicine) Danie Binder, MD (Inactive) as Consulting Physician (Gastroenterology) Daiva Huge, RN as Oncology Nurse Navigator   Name of the patient: Nichole Brown  [id number]  30-Aug-1966    Reason for referral-new diagnosis of breast cancer   Referring physician-Sharon Lavena Bullion, FNP  Date of visit: 08/29/22   History of presenting illness- Patient is a 56 year old female who underwent a routine bilateral screening mammogram in July 2023 which showed a possible asymmetry in her left breast.  This was followed by a diagnostic mammogram and ultrasound which showed multiple benign cysts noted at the 1 o'clock position.  At the 2 o'clock position 3 cm from the nipple was a oval hypoechoic mass measuring 5 x 4 x 3 mm.  No suspicious left axillary adenopathy.  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  Patient was having some vaginal dryness and started taking progesterone supplements since July 2023.  She was also on topical testosterone.  Menarche at the age of 48.  She underwent hysterectomy and bilateral oophorectomy for polycystic disease at the age of 67.  She is G2 P2.  Age at first birth 74.  Family history of breast cancer in her mother and maternal grandfather had throat cancer.  ECOG PS- 0  Pain scale- 0   Review of systems- Review of Systems  Constitutional:  Negative for chills, fever, malaise/fatigue and weight loss.  HENT:  Negative for congestion, ear discharge and nosebleeds.   Eyes:  Negative for blurred vision.  Respiratory:  Negative for cough, hemoptysis, sputum production, shortness of breath and wheezing.   Cardiovascular:  Negative for chest pain, palpitations, orthopnea and  claudication.  Gastrointestinal:  Negative for abdominal pain, blood in stool, constipation, diarrhea, heartburn, melena, nausea and vomiting.  Genitourinary:  Negative for dysuria, flank pain, frequency, hematuria and urgency.  Musculoskeletal:  Negative for back pain, joint pain and myalgias.  Skin:  Negative for rash.  Neurological:  Negative for dizziness, tingling, focal weakness, seizures, weakness and headaches.  Endo/Heme/Allergies:  Does not bruise/bleed easily.  Psychiatric/Behavioral:  Negative for depression and suicidal ideas. The patient does not have insomnia.     Allergies  Allergen Reactions   Cymbalta [Duloxetine Hcl] Other (See Comments)    Hallucinations; sleep walking   Nortriptyline Nausea And Vomiting   Phenergan [Promethazine Hcl]     Restless legs and cramping    Patient Active Problem List   Diagnosis Date Noted   Non-recurrent acute suppurative otitis media of right ear without spontaneous rupture of tympanic membrane 03/09/2021   Sore throat 03/09/2021   Dysphagia 11/17/2020   Rhinosinusitis 10/20/2020   Night muscle spasms 10/20/2020   Depression, major, single episode, moderate ([place]) 01/01/2019   Generalized anxiety disorder 01/01/2019   Chest pain 12/24/2018   Wellness examination    Taking multiple medications for chronic disease 07/30/2018   Impingement syndrome of left shoulder    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   Gastroesophageal reflux disease 12/26/2016   Grief at loss of child 11/09/2016   Sternal fracture 10/06/2015   Contusion of leg 10/06/2015   Migraine headache 07/24/2013   Insomnia 05/30/2013   Fibromyalgia 05/30/2013     Past Medical History:  Diagnosis Date   Anxiety    Bronchitis  Complication of anesthesia    PONV   Depression    Fibromyalgia 2005   GERD (gastroesophageal reflux disease)    Insomnia    Migraine headache    Pneumonia    PONV (postoperative nausea and vomiting)      Past  Surgical History:  Procedure Laterality Date   ABDOMINAL HYSTERECTOMY     BALLOON DILATION N/A 12/14/2020   Procedure: BALLOON DILATION;  Surgeon: Eloise Harman, DO;  [facility];  Service: Endoscopy;  Laterality: N/A;   BIOPSY  12/14/2020   Procedure: BIOPSY;  Surgeon: Eloise Harman, DO;  [facility];  Service: Endoscopy;;   BREAST BIOPSY Left 08/24/2022   [place] bx, coil marker path pending   CESAREAN SECTION     CHOLECYSTECTOMY  1988   COLONOSCOPY N/A 12/10/2018    six 4-8 mm polyps in proximal descending colon, mid transverse and hepatic flexure. Diverticulosis. External and internal hemorrhoids. Torturous colon. Two simple adenomas, two serrated polyps, 2 hyperplastic polyps. Surveillance due Dec 2022.    ESOPHAGOGASTRODUODENOSCOPY (EGD) WITH PROPOFOL N/A 12/14/2020   Benign-appearing esophageal stenosis s/p dilation, gastritis, normal duodenum. Mild chronic gastritis.    open heart surgery  1967   PATENT DUCTUS ARTERIOUS REPAIR  1967   POLYPECTOMY  12/10/2018   Procedure: POLYPECTOMY;  Surgeon: Danie Binder, MD;  [facility]: [facility];  Service: Endoscopy;;  hepatic flexure, descending, transverse   SHOULDER ARTHROSCOPY Left 06/16/2017   Procedure: LEFT SHOULDER ARTHROSCOPY, DEBRIDEMENT, AND DECOMPRESSION;  Surgeon: Newt Minion, MD;  Location: [place];  Service: Orthopedics;  Laterality: Left;   SHOULDER ARTHROSCOPY WITH SUBACROMIAL DECOMPRESSION Left 10/03/2019   Procedure: SHOULDER ARTHROSCOPY WITH DEBRIDEMENT, DECOMPRESSION;  Surgeon: Corky Mull, MD;  Location: [facility];  Service: Orthopedics;  Laterality: Left;   SHOULDER SURGERY Left 2008   [facility] joint    Social History   Socioeconomic History   Marital status: Married    Spouse name: daniel   Number of children: 4   Years of education: Not on file   Highest education level: Some college, no degree  Occupational History   Not on file  Tobacco Use   Smoking status: Former   Smokeless  tobacco: Never  Scientific laboratory technician Use: Never used  Substance and Sexual Activity   Alcohol use: Yes    Comment: occassionally   Drug use: No   Sexual activity: Yes    Birth control/protection: Surgical  Other Topics Concern   Not on file  Social History Narrative   Not on file   Social Determinants of Health   Financial Resource Strain: Low Risk  (07/12/2022)   Overall Financial Resource Strain (CARDIA)    Difficulty of Paying Living Expenses: Not hard at all  Food Insecurity: No Food Insecurity (07/12/2022)   Hunger Vital Sign    Worried About Running Out of Food in the Last Year: Never true    [place] in the Last Year: Never true  Transportation Needs: No Transportation Needs (07/12/2022)   PRAPARE - Hydrologist (Medical): No    Lack of Transportation (Non-Medical): No  Physical Activity: Insufficiently Active (07/12/2022)   Exercise Vital Sign    Days of Exercise per Week: 3 days    Minutes of Exercise per Session: 30 min  Stress: Stress Concern Present (07/12/2022)   [place]    Feeling of Stress : To some  extent  Social Connections: Moderately Integrated (07/12/2022)   Social Connection and Isolation Panel [NHANES]    Frequency of Communication with Friends and Family: Once a week    Frequency of Social Gatherings with Friends and Family: Once a week    Attends Religious Services: More than 4 times per year    Active Member of [employer] or Organizations: Yes    Attends Music therapist: More than 4 times per year    Marital Status: Married  Human resources officer Violence: Not At Risk (07/12/2022)   Humiliation, Afraid, Rape, and Kick questionnaire    Fear of Current or Ex-Partner: No    Emotionally Abused: No    Physically Abused: No    Sexually Abused: No     Family History  Problem Relation Age of Onset   Hypertension Maternal Grandmother    Diabetes Maternal  Grandmother    Heart disease Maternal Grandmother 21       MI   Hypertension Maternal Grandfather    Throat cancer Maternal Grandfather    Breast cancer Mother 76   Hypertension Father    Colon cancer Neg Hx      Current Outpatient Medications:    celecoxib (CELEBREX) 200 MG capsule, Take 1 capsule (200 mg total) by mouth 2 (two) times daily, Disp: 60 capsule, Rfl: 0   ondansetron (ZOFRAN-ODT) 4 MG disintegrating tablet, Take 1 tablet (4 mg total) by mouth every 8 (eight) hours as needed for nausea or vomiting., Disp: 20 tablet, Rfl: 0   progesterone (PROMETRIUM) 100 MG capsule, Take 100 mg by mouth daily., Disp: , Rfl:    rizatriptan (MAXALT-MLT) 10 MG disintegrating tablet, Take 1 tablet (10 mg total) by mouth once as needed (may repeat dose once in > 2 hrs) for up to 1 dose, Disp: 10 tablet, Rfl: 11   Testosterone 10 MG/ACT (2%) GEL, Place onto the skin., Disp: , Rfl:    tiZANidine (ZANAFLEX) 4 MG tablet, Take 1 tablet (4 mg total) by mouth 3 (three) times daily, Disp: 60 tablet, Rfl: 1   albuterol (VENTOLIN HFA) 108 (90 Base) MCG/ACT inhaler, Inhale 2 puffs into the lungs 4 (four) times daily as needed for wheezing or shortness of breath. (Patient not taking: Reported on 07/12/2022), Disp: 18 g, Rfl: 2   Cholecalciferol (VITAMIN D) 50 MCG (2000 UT) CAPS, Take by mouth daily. (Patient not taking: Reported on 08/29/2022), Disp: , Rfl:    estradiol (ESTRACE VAGINAL) 0.1 MG/GM vaginal cream, Use 1 gram twice a week (Patient not taking: Reported on 08/29/2022), Disp: 42.5 g, Rfl: 12   etodolac (LODINE) 500 MG tablet, Take 1 tablet (500 mg total) by mouth 2 (two) times daily for 30 days (Patient not taking: Reported on 07/12/2022), Disp: 60 tablet, Rfl: 1   famotidine (PEPCID) 20 MG tablet, , Disp: , Rfl:    gabapentin (NEURONTIN) 100 MG capsule, Take by mouth. (Patient not taking: Reported on 07/12/2022), Disp: 60 capsule, Rfl: 11   Lifitegrast (XIIDRA) 5 % SOLN, Instill 1 drop OU bid. (Patient not  taking: Reported on 08/29/2022), Disp: 180 each, Rfl: 4   meloxicam (MOBIC) 7.5 MG tablet, Take 1 tablet every day by oral route. (Patient not taking: Reported on 07/12/2022), Disp: 30 tablet, Rfl: 0   metroNIDAZOLE (FLAGYL) 500 MG tablet, 1 tab every twelve hours (Patient not taking: Reported on 07/12/2022), Disp: 14 tablet, Rfl: 0   NEOMYCIN-POLYMYXIN-HYDROCORTISONE (CORTISPORIN) 1 % SOLN OTIC solution, PLACE 4 DROPS TO THE AFFECTED EAR/EARS 3 TIMES A  DAY FOR 7 DAYS. (Patient not taking: Reported on 07/12/2022), Disp: 10 mL, Rfl: 0   predniSONE (DELTASONE) 10 MG tablet, tabs take as directed (Patient not taking: Reported on 07/12/2022), Disp: 21 tablet, Rfl: 0   topiramate (TOPAMAX) 25 MG tablet, Take 1 tablet (25 mg total) by mouth 2 (two) times daily (Patient not taking: Reported on 08/29/2022), Disp: 60 tablet, Rfl: 11   XIIDRA 5 % SOLN, Place 1 drop into both eyes 2 (two) times daily. (Patient not taking: Reported on 07/12/2022), Disp: , Rfl:    Physical exam:  Vitals:   08/29/22 1424  BP: 108/74  Pulse: 86  Resp: 16  Temp: 98.7 F (37.1 C)  SpO2: 100%  Weight: 166 lb (75.3 kg)   Physical Exam Constitutional:      General: She is not in acute distress. Cardiovascular:     Rate and Rhythm: Normal rate and regular rhythm.     Heart sounds: Normal heart sounds.  Pulmonary:     Effort: Pulmonary effort is normal.     Breath sounds: Normal breath sounds.  Abdominal:     General: Bowel sounds are normal.     Palpations: Abdomen is soft.  Skin:    General: Skin is warm and dry.  Neurological:     Mental Status: She is alert and oriented to person, place, and time.     Breast exam: No palpable masses in either breast.  No palpable bilateral axillary adenopathy.  Bruising noted at the site of recent left breast biopsy.      Latest Ref Rng & Units 10/29/2021   10:30 AM  CMP  Glucose 70 - 99 mg/dL 127   BUN 6 - 20 mg/dL 14   Creatinine 0.44 - 1.00 mg/dL 0.62   Sodium 135 - 145 mmol/L  136   Potassium 3.5 - 5.1 mmol/L 3.7   Chloride 98 - 111 mmol/L 106   CO2 22 - 32 mmol/L 23   Calcium 8.9 - 10.3 mg/dL 9.2   Total Protein 6.5 - 8.1 g/dL 7.1   Total Bilirubin 0.3 - 1.2 mg/dL 0.5   Alkaline Phos 38 - 126 U/L 75   AST 15 - 41 U/L 20   ALT 0 - 44 U/L 25       Latest Ref Rng & Units 10/29/2021   10:30 AM  CBC  WBC 4.0 - 10.5 K/uL 8.4   Hemoglobin 12.0 - 15.0 g/dL 13.9   Hematocrit 36.0 - 46.0 % 41.0   Platelets 150 - 400 K/uL 310     No images are attached to the encounter.  [place] LT BREAST BX W LOC DEV 1ST LESION IMG BX SPEC US GUIDE  Addendum Date: 08/25/2022   ADDENDUM REPORT: 08/25/2022 14:55 ADDENDUM: PATHOLOGY revealed: A. BREAST, LEFT AT 2:00, 3 CM FROM THE NIPPLE; ULTRASOUND-GUIDED CORE NEEDLE BIOPSY: - INVASIVE MAMMARY CARCINOMA, NO SPECIAL TYPE. Size of invasive carcinoma: 6 mm in this sample. Grade 1. Ductal carcinoma in situ: Present, intermediate grade. Lymphovascular invasion: Not identified. Pathology results are CONCORDANT with imaging findings, per Dr. Fidela Salisbury. Pathology results and recommendations were discussed with patient via telephone on 08/25/2022. Patient reported biopsy site doing well with no adverse symptoms, and only slight tenderness at the site. Post biopsy care instructions were reviewed, questions were answered and my direct phone number was provided. Patient was instructed to call [facility] for any additional questions or concerns related to biopsy site. RECOMMENDATIONS: Surgical consultation. Request for surgical consultation relayed  to [place] [facility] by Electa Sniff RN on 08/25/2022. Pathology results reported by Electa Sniff RN on 08/25/2022. Electronically Signed   By: Fidela Salisbury M.D.   On: 08/25/2022 14:55   Result Date: 08/25/2022 CLINICAL DATA:  Left breast 2 o'clock mass. EXAM: ULTRASOUND GUIDED LEFT BREAST CORE NEEDLE BIOPSY COMPARISON:  Previous exam(s). PROCEDURE: I met with  the patient and we discussed the procedure of ultrasound-guided biopsy, including benefits and alternatives. We discussed the high likelihood of a successful procedure. We discussed the risks of the procedure, including infection, bleeding, tissue injury, clip migration, and inadequate sampling. Informed written consent was given. The usual time-out protocol was performed immediately prior to the procedure. Lesion quadrant: Upper outer quadrant Using sterile technique and 1% Lidocaine as local anesthetic, under direct ultrasound visualization, a 14 gauge spring-loaded device was used to perform biopsy of left breast 2 o'clock mass using a inferior approach. At the conclusion of the procedure coil shaped tissue marker clip was deployed into the biopsy cavity. Follow up 2 view mammogram was performed and dictated separately. IMPRESSION: Ultrasound guided biopsy of left breast.  No apparent complications. Electronically Signed: By: Fidela Salisbury M.D. On: 08/24/2022 11:46   MM CLIP PLACEMENT LEFT  Result Date: 08/24/2022 CLINICAL DATA:  Post ultrasound-guided core needle biopsy of left breast 2 o'clock mass. EXAM: 3D DIAGNOSTIC LEFT MAMMOGRAM POST ULTRASOUND BIOPSY COMPARISON:  Previous exam(s). FINDINGS: 3D Mammographic images were obtained following ultrasound guided biopsy of left breast 2 o'clock mass. The biopsy marking clip is in expected position at the site of biopsy. IMPRESSION: Appropriate positioning of the coil shaped biopsy marking clip at the site of biopsy in the left breast 2 o'clock. Final Assessment: Post Procedure Mammograms for Marker Placement Electronically Signed   By: Fidela Salisbury M.D.   On: 08/24/2022 11:46  MM DIAG BREAST TOMO UNI LEFT  Result Date: 08/04/2022 CLINICAL DATA:  Callback for 2 LEFT breast asymmetries seen on MLO view EXAM: DIGITAL DIAGNOSTIC UNILATERAL LEFT MAMMOGRAM WITH TOMOSYNTHESIS; ULTRASOUND LEFT BREAST LIMITED TECHNIQUE: Left digital diagnostic  mammography and breast tomosynthesis was performed.; Targeted ultrasound examination of the left breast was performed. COMPARISON:  Previous exam(s). ACR Breast Density Category c: The breast tissue is heterogeneously dense, which may obscure small masses. FINDINGS: The previously described inferior finding does not persist with additional views, consistent with superimposed fibroglandular tissue. Spot compression tomosynthesis views confirm persistence of an oval obscured mass in the LEFT upper breast at middle depth. It is best seen on ML slice 54, on the edge of the paddle on spot MLO volume 1 image 45, and volume 2 image 47. On physical exam, no suspicious mass is appreciated. Targeted ultrasound was performed of the LEFT upper breast. There are multiple adjacent benign cysts noted 1 o'clock 6 cm from the nipple. However at 2 o'clock 3 cm from the nipple, there is an oval mildly hypoechoic mass with irregular margins. This measures 5 x 4 x 3 mm. This is favored to likely correspond to the site of screening mammographic concern. Targeted ultrasound was performed of the LEFT axilla. No suspicious axillary lymph nodes are visualized. IMPRESSION: 1. There is an indeterminate 5 mm mass at 2 o'clock 3 cm from the nipple. Recommend ultrasound guided biopsy for definitive characterization (aspiration could first be attempted given presence of multiple cysts). 2. No suspicious LEFT axillary adenopathy. RECOMMENDATION: LEFT breast ultrasound-guided biopsy x1 (aspiration could be attempted first given presence of multiple cysts). I have  discussed the findings and recommendations with the patient. The biopsy procedure was discussed with the patient and questions were answered. Patient expressed their understanding of the biopsy recommendation. Patient will be scheduled for biopsy at her earliest convenience by the schedulers. Ordering provider will be notified. If applicable, a reminder letter will be sent to the patient  regarding the next appointment. BI-RADS CATEGORY  4: Suspicious. Electronically Signed   By: Valentino Saxon M.D.   On: 08/04/2022 16:38  US BREAST LTD UNI LEFT INC AXILLA  Result Date: 08/04/2022 CLINICAL DATA:  Callback for 2 LEFT breast asymmetries seen on MLO view EXAM: DIGITAL DIAGNOSTIC UNILATERAL LEFT MAMMOGRAM WITH TOMOSYNTHESIS; ULTRASOUND LEFT BREAST LIMITED TECHNIQUE: Left digital diagnostic mammography and breast tomosynthesis was performed.; Targeted ultrasound examination of the left breast was performed. COMPARISON:  Previous exam(s). ACR Breast Density Category c: The breast tissue is heterogeneously dense, which may obscure small masses. FINDINGS: The previously described inferior finding does not persist with additional views, consistent with superimposed fibroglandular tissue. Spot compression tomosynthesis views confirm persistence of an oval obscured mass in the LEFT upper breast at middle depth. It is best seen on ML slice 54, on the edge of the paddle on spot MLO volume 1 image 45, and volume 2 image 47. On physical exam, no suspicious mass is appreciated. Targeted ultrasound was performed of the LEFT upper breast. There are multiple adjacent benign cysts noted 1 o'clock 6 cm from the nipple. However at 2 o'clock 3 cm from the nipple, there is an oval mildly hypoechoic mass with irregular margins. This measures 5 x 4 x 3 mm. This is favored to likely correspond to the site of screening mammographic concern. Targeted ultrasound was performed of the LEFT axilla. No suspicious axillary lymph nodes are visualized. IMPRESSION: 1. There is an indeterminate 5 mm mass at 2 o'clock 3 cm from the nipple. Recommend ultrasound guided biopsy for definitive characterization (aspiration could first be attempted given presence of multiple cysts). 2. No suspicious LEFT axillary adenopathy. RECOMMENDATION: LEFT breast ultrasound-guided biopsy x1 (aspiration could be attempted first given presence of  multiple cysts). I have discussed the findings and recommendations with the patient. The biopsy procedure was discussed with the patient and questions were answered. Patient expressed their understanding of the biopsy recommendation. Patient will be scheduled for biopsy at her earliest convenience by the schedulers. Ordering provider will be notified. If applicable, a reminder letter will be sent to the patient regarding the next appointment. BI-RADS CATEGORY  4: Suspicious. Electronically Signed   By: Valentino Saxon M.D.   On: 08/04/2022 16:38   Assessment and plan- Patient is a 56 y.o. 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  Patient noted to [facility] a 5 mm left breast mass which was biopsy-proven grade 1 ER/PR positive HER2 negative left breast cancer.  Size of the breast mass on biopsy was 6 mm.  I would recommend upfront lumpectomy and sentinel lymph node biopsy for her.  I will see her after final pathology results are back to discuss if she needs Oncotype testing or not.  Discussed what Oncotype testing is and how the results are interpreted.  She will also see radiation oncology on the same day.  Given that her tumor was ER positive I would recommend 5 years of endocrine therapy as well after radiation is done and I will discuss this with her in greater detail down the line.  With personal and family history of breast cancer I will also refer her to genetic counseling at this time.  Follow-up with me to be decided based on date of lumpectomy  Patient is taking progesterone supplements for vaginitis.  I have asked her to stop taking that and go back to nonhormonal measures such as K-Y jelly   Thank you for this  kind referral and the opportunity to participate in the care of this patient   Visit Diagnosis 1. Malignant neoplasm of upper-outer quadrant of left breast in female, estrogen receptor positive ([place])   2. Goals of care, counseling/discussion     Dr. Randa Evens, MD, MPH [facility] [phone] 08/29/2022

## 2022-08-29 NOTE — Progress Notes (Signed)
Accompanied patient and daughter to initial medical oncology appointment.   Reviewed Breast Cancer treatment handbook.   Care plan summary given to patient.   Reviewed outreach programs and cancer center services.  

## 2022-09-01 ENCOUNTER — Other Ambulatory Visit: Payer: Self-pay | Admitting: General Surgery

## 2022-09-01 ENCOUNTER — Other Ambulatory Visit: Payer: Self-pay

## 2022-09-01 ENCOUNTER — Ambulatory Visit: Payer: 59 | Attending: General Surgery | Admitting: Rehabilitation

## 2022-09-01 ENCOUNTER — Encounter: Payer: Self-pay | Admitting: Rehabilitation

## 2022-09-01 DIAGNOSIS — M25612 Stiffness of left shoulder, not elsewhere classified: Secondary | ICD-10-CM | POA: Insufficient documentation

## 2022-09-01 DIAGNOSIS — Z170 Estrogen receptor positive status [ER+]: Secondary | ICD-10-CM | POA: Diagnosis not present

## 2022-09-01 DIAGNOSIS — R29898 Other symptoms and signs involving the musculoskeletal system: Secondary | ICD-10-CM | POA: Insufficient documentation

## 2022-09-01 DIAGNOSIS — C50412 Malignant neoplasm of upper-outer quadrant of left female breast: Secondary | ICD-10-CM | POA: Diagnosis not present

## 2022-09-01 NOTE — Therapy (Signed)
OUTPATIENT PHYSICAL THERAPY BREAST CANCER BASELINE EVALUATION   Patient Name: Nichole Brown MRN: [id number] DOB:September 04, 1966, 56 y.o., female Today's Date: 09/01/2022   PT End of Session - 09/01/22 1710     Visit Number 1    Number of Visits 6    Date for PT Re-Evaluation 09/29/22    PT Start Time 1600    PT Stop Time 7494    PT Time Calculation (min) 35 min    Activity Tolerance Patient tolerated treatment well    Behavior During Therapy [facility] for tasks assessed/performed             Past Medical History:  Diagnosis Date   Anxiety    Bronchitis    Complication of anesthesia    PONV   Depression    Fibromyalgia 2005   GERD (gastroesophageal reflux disease)    Insomnia    Migraine headache    Pneumonia    PONV (postoperative nausea and vomiting)    Past Surgical History:  Procedure Laterality Date   ABDOMINAL HYSTERECTOMY     BALLOON DILATION N/A 12/14/2020   Procedure: BALLOON DILATION;  Surgeon: Eloise Harman, DO;  [facility];  Service: Endoscopy;  Laterality: N/A;   BIOPSY  12/14/2020   Procedure: BIOPSY;  Surgeon: Eloise Harman, DO;  [facility];  Service: Endoscopy;;   BREAST BIOPSY Left 08/24/2022   [place] bx, coil marker path pending   CESAREAN SECTION     CHOLECYSTECTOMY  1988   COLONOSCOPY N/A 12/10/2018    six 4-8 mm polyps in proximal descending colon, mid transverse and hepatic flexure. Diverticulosis. External and internal hemorrhoids. Torturous colon. Two simple adenomas, two serrated polyps, 2 hyperplastic polyps. Surveillance due Dec 2022.    ESOPHAGOGASTRODUODENOSCOPY (EGD) WITH PROPOFOL N/A 12/14/2020   Benign-appearing esophageal stenosis s/p dilation, gastritis, normal duodenum. Mild chronic gastritis.    open heart surgery  1967   PATENT DUCTUS ARTERIOUS REPAIR  1967   POLYPECTOMY  12/10/2018   Procedure: POLYPECTOMY;  Surgeon: Danie Binder, MD;  [facility];  Service: Endoscopy;;  hepatic flexure,  descending, transverse   SHOULDER ARTHROSCOPY Left 06/16/2017   Procedure: LEFT SHOULDER ARTHROSCOPY, DEBRIDEMENT, AND DECOMPRESSION;  Surgeon: Newt Minion, MD;  Location: [facility];  Service: Orthopedics;  Laterality: Left;   SHOULDER ARTHROSCOPY WITH SUBACROMIAL DECOMPRESSION Left 10/03/2019   Procedure: SHOULDER ARTHROSCOPY WITH DEBRIDEMENT, DECOMPRESSION;  Surgeon: Corky Mull, MD;  Location: [facility];  Service: Orthopedics;  Laterality: Left;   SHOULDER SURGERY Left 2008   [facility] joint   Patient Active Problem List   Diagnosis Date Noted   Malignant neoplasm of upper-outer quadrant of left breast in female, estrogen receptor positive ([place]) 08/29/2022   Goals of care, counseling/discussion 08/29/2022   Non-recurrent acute suppurative otitis media of right ear without spontaneous rupture of tympanic membrane 03/09/2021   Sore throat 03/09/2021   Dysphagia 11/17/2020   Rhinosinusitis 10/20/2020   Night muscle spasms 10/20/2020   Depression, major, single episode, moderate ([place]) 01/01/2019   Generalized anxiety disorder 01/01/2019   Chest pain 12/24/2018   Wellness examination    Taking multiple medications for chronic disease 07/30/2018   Impingement syndrome of left shoulder    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   Gastroesophageal reflux disease 12/26/2016   Grief at loss of child 11/09/2016   Sternal fracture 10/06/2015   Contusion of leg 10/06/2015   Migraine headache 07/24/2013   Insomnia 05/30/2013   Fibromyalgia 05/30/2013  PCP: Threasa Alpha, NP   REFERRING PROVIDER: Dr. Luiz Iron DIAG: left breast cancer  THERAPY DIAG:  Malignant neoplasm of upper-outer quadrant of left breast in female, estrogen receptor positive ([place])  Stiffness of left shoulder, not elsewhere classified  Shoulder weakness  Rationale for Evaluation and Treatment Rehabilitation  ONSET DATE: 08/29/22  SUBJECTIVE                                                                                                                                                                                            SUBJECTIVE STATEMENT: Patient reports she is here today to be seen by her medical team for her newly diagnosed left breast cancer.   PERTINENT HISTORY:  This mass was biopsied and was consistent with invasive mammary carcinoma grade 1 ER positive greater than 90%, PR +31 to 40% and HER2 negative. Hx of 3 shoulder surgeries on the left prior to surgery.    PATIENT GOALS   reduce lymphedema risk and learn post op HEP.   PAIN:  Are you having pain? Yes: NPRS scale: 3/10 Pain location: front of left shoulder - feels like a catch Pain description: achy, sometimes sharp Aggravating factors: work Relieving factors: not using it much  PRECAUTIONS: Active CA   HAND DOMINANCE: right  WEIGHT BEARING RESTRICTIONS No  FALLS:  Has patient fallen in last 6 months? 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  LEISURE: walking   PRIOR LEVEL OF FUNCTION: Independent   OBJECTIVE  COGNITION:  Overall cognitive status: Within functional limits for tasks assessed    POSTURE:  Forward head and rounded shoulders posture  UPPER EXTREMITY AROM/PROM:  A/PROM RIGHT   eval   Shoulder extension 25 - pain  Shoulder flexion 140 - pain  Shoulder abduction 125- pain  Shoulder internal rotation   Shoulder external rotation 82 - pn    (Blank rows = not tested)  LYMPHEDEMA ASSESSMENTS:   LANDMARK RIGHT   eval  10 cm proximal to olecranon process 29.5  Olecranon process 25.5  10 cm proximal to ulnar styloid process 22.5  Just proximal to ulnar styloid process 15.8  Across hand at thumb web space 18.8  At base of 2nd digit 6.1  (Blank rows = not tested)  LANDMARK LEFT   eval  10 cm proximal to olecranon process 30.0  Olecranon process 25.5  10 cm proximal to ulnar styloid process 20.5  Just  proximal to ulnar styloid process 16  Across hand at thumb web space 18.7  At base of 2nd digit 6.0  (Blank  rows = not tested)   L-DEX LYMPHEDEMA SCREENING: The patient was assessed using the L-Dex machine today to produce a lymphedema index baseline score. The patient will be reassessed on a regular basis (typically every 3 months) to obtain new L-Dex scores. If the score is > 6.5 points away from his/her baseline score indicating onset of subclinical lymphedema, it will be recommended to wear a compression garment for 4 weeks, 12 hours per day and then be reassessed. If the score continues to be > 6.5 points from baseline at reassessment, we will initiate lymphedema treatment. Assessing in this manner has a 95% rate of preventing clinically significant lymphedema.  TODAY"S TREATMENT Date: 09/01/22 HEP established - see below for post op and now leading up to surgery    PATIENT EDUCATION:  Education details: Lymphedema risk reduction and post op shoulder/posture HEP Person educated: Patient Education method: Explanation, Demonstration, Handout Education comprehension: Patient verbalized understanding and returned demonstration   HOME EXERCISE PROGRAM: Patient was instructed today in a home exercise program today for post op shoulder range of motion. These included active assist shoulder flexion in sitting, scapular retraction, wall walking with shoulder abduction, and hands behind head external rotation.  She was encouraged to do these twice a day, holding 3 seconds and repeating 5 times when permitted by her physician. - Pt will start now due to shoulder status   ASSESSMENT: CLINICAL IMPRESSION: Pt is planning to have a left lumpectomy with SLNB and radiation at an unknown date.  She has a hx of 3 shoulder surgeries and the need for a RTSR sometime in the future.  Dr. Donne Hazel and pt would like to have her attempt some visits prior to surgery to minimize potential for side effects. She  will benefit from a post op PT reassessment to determine needs and from L-Dex screens every 3 months for 2 years to detect subclinical lymphedema.  Pt will benefit from skilled therapeutic intervention to improve on the following deficits: Decreased knowledge of precautions, impaired UE functional use, pain, decreased ROM, postural dysfunction.   PT treatment/interventions: ADL/self-care home management, pt/family education, therapeutic exercise, manual therapy, re-evaluation  REHAB POTENTIAL: Good  CLINICAL DECISION MAKING: Stable/uncomplicated  EVALUATION COMPLEXITY: Low   GOALS: Goals reviewed with patient? YES  LONG TERM GOALS: (STG=LTG)    Name Target Date Goal status  1 Pt will be able to verbalize understanding of pertinent lymphedema risk reduction practices relevant to her dx specifically related to skin care.  Baseline:  No knowledge 09/01/2022 Achieved at eval  2 Pt will be able to return demo and/or verbalize understanding of the post op HEP related to regaining shoulder ROM. Baseline:  No knowledge 09/01/2022 Achieved at eval  3 Pt will be able to verbalize understanding of the importance of attending the post op After Breast CA Class for further lymphedema risk reduction education and therapeutic exercise.  Baseline:  No knowledge 09/01/2022 Achieved at eval  4 Pt will demo she has regained full shoulder ROM and function post operatively compared to baselines.  Baseline: See objective measurements taken today. 10/12/22   5 Pt will improve Left shoulder AROM by at least 140 degrees to be able to tolerate radiation positioning 09/29/22       PLAN: PT FREQUENCY/DURATION: 2x per week up until surgery and then 1 post op visit    PLAN FOR NEXT SESSION: Lt shoulder PROM, AAROM activities, isometrics? To prepare for lumpectomy and radiation positioning. No modalities in the area.  Schedule out more  visits until lumpectomy ortho/cancer side both fine and then 3 weeks post  lumpectomy with cancer/Antone Summons.     Patient will follow up at outpatient cancer rehab 3-4 weeks following surgery.  If the patient requires physical therapy at that time, a specific plan will be dictated and sent to the referring physician for approval. The patient was educated today on appropriate basic range of motion exercises to begin post operatively and the importance of attending the After Breast Cancer class following surgery.  Patient was educated today on lymphedema risk reduction practices as it pertains to recommendations that will benefit the patient immediately following surgery.  She verbalized good understanding.    Physical Therapy Information for After Breast Cancer Surgery/Treatment:  Lymphedema is a swelling condition that you may be at risk for in your arm if you have lymph nodes removed from the armpit area.  After a sentinel node biopsy, the risk is approximately 5-9% and is higher after an axillary node dissection.  There is treatment available for this condition and it is not life-threatening.  Contact your physician or physical therapist with concerns. You may begin the 4 shoulder/posture exercises (see additional sheet) when permitted by your physician (typically a week after surgery).  If you have drains, you may need to wait until those are removed before beginning range of motion exercises.  A general recommendation is to not lift your arms above shoulder height until drains are removed.  These exercises should be done to your tolerance and gently.  This is not a "no pain/no gain" type of recovery so listen to your body and stretch into the range of motion that you can tolerate, stopping if you have pain.  If you are having immediate reconstruction, ask your plastic surgeon about doing exercises as he or she may want you to wait. We encourage you to attend the free one time ABC (After Breast Cancer) class offered by [facility].  You will learn information  related to lymphedema risk, prevention and treatment and additional exercises to regain mobility following surgery.  You can call [phone] for more information.  This is offered the 1st and 3rd Monday of each month.  You only attend the class one time. While undergoing any medical procedure or treatment, try to avoid blood pressure being taken or needle sticks from occurring on the arm on the side of cancer.   This recommendation begins after surgery and continues for the rest of your life.  This may help reduce your risk of getting lymphedema (swelling in your arm). An excellent resource for those seeking information on lymphedema is the National Lymphedema Network's web site. It can be accessed at [place].org If you notice swelling in your hand, arm or breast at any time following surgery (even if it is many years from now), please contact your doctor or physical therapist to discuss this.  Lymphedema can be treated at any time but it is easier for you if it is treated early on.  If you feel like your shoulder motion is not returning to normal in a reasonable amount of time, please contact your surgeon or physical therapist.  [facility] [phone]. [address], [address], [place]  [address]  ABC CLASS After Breast Cancer Class  After Breast Cancer Class is a specially designed exercise class to assist you in a safe recover after having breast cancer surgery.  In this class you will learn how to get back to full function  whether your drains were just removed or if you had surgery a month ago.  This one-time class is held the 1st and 3rd Monday of every month from 11:00 a.m. until 12:00 noon virtually.  This class is FREE and space is limited. For more information or to register for the next available class, call [phone].  Class Goals  Understand specific stretches to improve the flexibility of you chest and shoulder. Learn ways to safely  strengthen your upper body and improve your posture. Understand the warning signs of infection and why you may be at risk for an arm infection. Learn about Lymphedema and prevention.  ** You do not attend this class until after surgery.  Drains must be removed to participate  Patient was instructed today in a home exercise program today for post op shoulder range of motion. These included active assist shoulder flexion in sitting, scapular retraction, wall walking with shoulder abduction, and hands behind head external rotation.  She was encouraged to do these twice a day, holding 3 seconds and repeating 5 times when permitted by her physician.    Stark Bray, PT 09/01/2022, 5:11 PM

## 2022-09-02 ENCOUNTER — Ambulatory Visit: Payer: 59

## 2022-09-02 DIAGNOSIS — Z170 Estrogen receptor positive status [ER+]: Secondary | ICD-10-CM | POA: Diagnosis not present

## 2022-09-02 DIAGNOSIS — C50412 Malignant neoplasm of upper-outer quadrant of left female breast: Secondary | ICD-10-CM | POA: Diagnosis not present

## 2022-09-02 DIAGNOSIS — R29898 Other symptoms and signs involving the musculoskeletal system: Secondary | ICD-10-CM | POA: Diagnosis not present

## 2022-09-02 DIAGNOSIS — M25612 Stiffness of left shoulder, not elsewhere classified: Secondary | ICD-10-CM

## 2022-09-02 NOTE — Therapy (Signed)
OUTPATIENT PHYSICAL THERAPY BREAST CANCER TREATMENT NOTE   Patient Name: Nichole Brown MRN: [id number] DOB:10-Feb-1966, 56 y.o., female Today's Date: 09/02/2022   PT End of Session - 09/02/22 0808     Visit Number 2    Number of Visits 6    Date for PT Re-Evaluation 09/29/22    Authorization Type [employer]    PT Start Time 0803    PT Stop Time 0840    PT Time Calculation (min) 37 min    Activity Tolerance Patient tolerated treatment well    Behavior During Therapy [facility] for tasks assessed/performed             Past Medical History:  Diagnosis Date   Anxiety    Bronchitis    Complication of anesthesia    PONV   Depression    Fibromyalgia 2005   GERD (gastroesophageal reflux disease)    Insomnia    Migraine headache    Pneumonia    PONV (postoperative nausea and vomiting)    Past Surgical History:  Procedure Laterality Date   ABDOMINAL HYSTERECTOMY     BALLOON DILATION N/A 12/14/2020   Procedure: BALLOON DILATION;  Surgeon: Eloise Harman, DO;  [facility];  Service: Endoscopy;  Laterality: N/A;   BIOPSY  12/14/2020   Procedure: BIOPSY;  Surgeon: Eloise Harman, DO;  [facility];  Service: Endoscopy;;   BREAST BIOPSY Left 08/24/2022   [place] bx, coil marker path pending   CESAREAN SECTION     CHOLECYSTECTOMY  1988   COLONOSCOPY N/A 12/10/2018    six 4-8 mm polyps in proximal descending colon, mid transverse and hepatic flexure. Diverticulosis. External and internal hemorrhoids. Torturous colon. Two simple adenomas, two serrated polyps, 2 hyperplastic polyps. Surveillance due Dec 2022.    ESOPHAGOGASTRODUODENOSCOPY (EGD) WITH PROPOFOL N/A 12/14/2020   Benign-appearing esophageal stenosis s/p dilation, gastritis, normal duodenum. Mild chronic gastritis.    open heart surgery  1967   PATENT DUCTUS ARTERIOUS REPAIR  1967   POLYPECTOMY  12/10/2018   Procedure: POLYPECTOMY;  Surgeon: Danie Binder, MD;  [facility];   Service: Endoscopy;;  hepatic flexure, descending, transverse   SHOULDER ARTHROSCOPY Left 06/16/2017   Procedure: LEFT SHOULDER ARTHROSCOPY, DEBRIDEMENT, AND DECOMPRESSION;  Surgeon: Newt Minion, MD;  Location: [place];  Service: Orthopedics;  Laterality: Left;   SHOULDER ARTHROSCOPY WITH SUBACROMIAL DECOMPRESSION Left 10/03/2019   Procedure: SHOULDER ARTHROSCOPY WITH DEBRIDEMENT, DECOMPRESSION;  Surgeon: Corky Mull, MD;  Location: [facility];  Service: Orthopedics;  Laterality: Left;   SHOULDER SURGERY Left 2008   [facility] joint   Patient Active Problem List   Diagnosis Date Noted   Malignant neoplasm of upper-outer quadrant of left breast in female, estrogen receptor positive ([place]) 08/29/2022   Goals of care, counseling/discussion 08/29/2022   Non-recurrent acute suppurative otitis media of right ear without spontaneous rupture of tympanic membrane 03/09/2021   Sore throat 03/09/2021   Dysphagia 11/17/2020   Rhinosinusitis 10/20/2020   Night muscle spasms 10/20/2020   Depression, major, single episode, moderate ([place]) 01/01/2019   Generalized anxiety disorder 01/01/2019   Chest pain 12/24/2018   Wellness examination    Taking multiple medications for chronic disease 07/30/2018   Impingement syndrome of left shoulder    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   Gastroesophageal reflux disease 12/26/2016   Grief at loss of child 11/09/2016   Sternal fracture 10/06/2015   Contusion of leg 10/06/2015   Migraine headache 07/24/2013  Insomnia 05/30/2013   Fibromyalgia 05/30/2013    PCP: Threasa Alpha, NP   REFERRING PROVIDER: Dr. Luiz Iron DIAG: left breast cancer  THERAPY DIAG:  Malignant neoplasm of upper-outer quadrant of left breast in female, estrogen receptor positive ([place])  Stiffness of left shoulder, not elsewhere classified  Shoulder weakness  Rationale for Evaluation and Treatment Rehabilitation  ONSET DATE: 08/29/22  SUBJECTIVE                                                                                                                                                                                            SUBJECTIVE STATEMENT: Patient reports doing well.  No further issues since yesterday.     PERTINENT HISTORY:  This mass was biopsied and was consistent with invasive mammary carcinoma grade 1 ER positive greater than 90%, PR +31 to 40% and HER2 negative. Hx of 3 shoulder surgeries on the left prior to surgery.    PATIENT GOALS   reduce lymphedema risk and learn post op HEP.   PAIN:  Are you having pain? Yes: NPRS scale: 3/10 Pain location: front of left shoulder - feels like a catch Pain description: achy, sometimes sharp Aggravating factors: work Relieving factors: not using it much  PRECAUTIONS: Active CA   HAND DOMINANCE: right  WEIGHT BEARING RESTRICTIONS No  FALLS:  Has patient fallen in last 6 months? 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  LEISURE: walking   PRIOR LEVEL OF FUNCTION: Independent   OBJECTIVE  COGNITION:  Overall cognitive status: Within functional limits for tasks assessed    POSTURE:  Forward head and rounded shoulders posture  UPPER EXTREMITY AROM/PROM:  A/PROM RIGHT   eval   Shoulder extension 25 - pain  Shoulder flexion 140 - pain  Shoulder abduction 125- pain  Shoulder internal rotation   Shoulder external rotation 82 - pn    (Blank rows = not tested)  LYMPHEDEMA ASSESSMENTS:   LANDMARK RIGHT   eval  10 cm proximal to olecranon process 29.5  Olecranon process 25.5  10 cm proximal to ulnar styloid process 22.5  Just proximal to ulnar styloid process 15.8  Across hand at thumb web space 18.8  At base of 2nd digit 6.1  (Blank rows = not tested)  LANDMARK LEFT   eval  10 cm proximal to olecranon process 30.0  Olecranon process 25.5  10 cm proximal to ulnar styloid process 20.5  Just proximal to  ulnar styloid process 16  Across hand at thumb web space 18.7  At base of 2nd digit 6.0  (  Blank rows = not tested)   L-DEX LYMPHEDEMA SCREENING: The patient was assessed using the L-Dex machine today to produce a lymphedema index baseline score. The patient will be reassessed on a regular basis (typically every 3 months) to obtain new L-Dex scores. If the score is > 6.5 points away from his/her baseline score indicating onset of subclinical lymphedema, it will be recommended to wear a compression garment for 4 weeks, 12 hours per day and then be reassessed. If the score continues to be > 6.5 points from baseline at reassessment, we will initiate lymphedema treatment. Assessing in this manner has a 95% rate of preventing clinically significant lymphedema.  TODAY"S TREATMENT Date: 09/02/22 Finger ladder flexion x 10 Pulleys : flexion, scaption, IR x 2 min each Doorway stretch  3 x 30 sec Side lying sleeper stretch x 10 hold 10 sec PROM all planes x 10 each holding 10 sec  Date: 09/01/22 HEP established - see below for post op and now leading up to surgery    PATIENT EDUCATION:  Education details: Lymphedema risk reduction and post op shoulder/posture HEP Person educated: Patient Education method: Explanation, Demonstration, Handout Education comprehension: Patient verbalized understanding and returned demonstration   HOME EXERCISE PROGRAM: Patient was instructed today in a home exercise program today for post op shoulder range of motion. These included active assist shoulder flexion in sitting, scapular retraction, wall walking with shoulder abduction, and hands behind head external rotation.  She was encouraged to do these twice a day, holding 3 seconds and repeating 5 times when permitted by her physician. - Pt will start now due to shoulder status   ASSESSMENT: CLINICAL IMPRESSION: Patient was able to tolerate ROM today with mod pain.  Her most limiting structure appears to be  posterior capsule.  She would benefit from continuing skilled PT until surgery date to prepare for ROM needs post op.   Initial eval: Pt is planning to have a left lumpectomy with SLNB and radiation at an unknown date.  She has a hx of 3 shoulder surgeries and the need for a RTSR sometime in the future.  Dr. Donne Hazel and pt would like to have her attempt some visits prior to surgery to minimize potential for side effects. She will benefit from a post op PT reassessment to determine needs and from L-Dex screens every 3 months for 2 years to detect subclinical lymphedema.  Pt will benefit from skilled therapeutic intervention to improve on the following deficits: Decreased knowledge of precautions, impaired UE functional use, pain, decreased ROM, postural dysfunction.   PT treatment/interventions: ADL/self-care home management, pt/family education, therapeutic exercise, manual therapy, re-evaluation  REHAB POTENTIAL: Good  CLINICAL DECISION MAKING: Stable/uncomplicated  EVALUATION COMPLEXITY: Low   GOALS: Goals reviewed with patient? YES  LONG TERM GOALS: (STG=LTG)    Name Target Date Goal status  1 Pt will be able to verbalize understanding of pertinent lymphedema risk reduction practices relevant to her dx specifically related to skin care.  Baseline:  No knowledge 09/01/2022 Achieved at eval  2 Pt will be able to return demo and/or verbalize understanding of the post op HEP related to regaining shoulder ROM. Baseline:  No knowledge 09/01/2022 Achieved at eval  3 Pt will be able to verbalize understanding of the importance of attending the post op After Breast CA Class for further lymphedema risk reduction education and therapeutic exercise.  Baseline:  No knowledge 09/01/2022 Achieved at eval  4 Pt will demo she has regained full shoulder ROM and function  post operatively compared to baselines.  Baseline: See objective measurements taken today. 10/12/22   5 Pt will improve Left shoulder  AROM by at least 140 degrees to be able to tolerate radiation positioning 09/29/22       PLAN: PT FREQUENCY/DURATION: 2x per week up until surgery and then 1 post op visit    PLAN FOR NEXT SESSION: Lt shoulder PROM, AAROM activities, isometrics? To prepare for lumpectomy and radiation positioning. No modalities in the area.  Schedule out more visits until lumpectomy ortho/cancer side both fine and then 3 weeks post lumpectomy with cancer/[place].     Patient will follow up at outpatient cancer rehab 3-4 weeks following surgery.  If the patient requires physical therapy at that time, a specific plan will be dictated and sent to the referring physician for approval. The patient was educated today on appropriate basic range of motion exercises to begin post operatively and the importance of attending the After Breast Cancer class following surgery.  Patient was educated today on lymphedema risk reduction practices as it pertains to recommendations that will benefit the patient immediately following surgery.  She verbalized good understanding.    Physical Therapy Information for After Breast Cancer Surgery/Treatment:  Lymphedema is a swelling condition that you may be at risk for in your arm if you have lymph nodes removed from the armpit area.  After a sentinel node biopsy, the risk is approximately 5-9% and is higher after an axillary node dissection.  There is treatment available for this condition and it is not life-threatening.  Contact your physician or physical therapist with concerns. You may begin the 4 shoulder/posture exercises (see additional sheet) when permitted by your physician (typically a week after surgery).  If you have drains, you may need to wait until those are removed before beginning range of motion exercises.  A general recommendation is to not lift your arms above shoulder height until drains are removed.  These exercises should be done to your tolerance and gently.  This is not a  "no pain/no gain" type of recovery so listen to your body and stretch into the range of motion that you can tolerate, stopping if you have pain.  If you are having immediate reconstruction, ask your plastic surgeon about doing exercises as he or she may want you to wait. We encourage you to attend the free one time ABC (After Breast Cancer) class offered by [facility].  You will learn information related to lymphedema risk, prevention and treatment and additional exercises to regain mobility following surgery.  You can call [phone] for more information.  This is offered the 1st and 3rd Monday of each month.  You only attend the class one time. While undergoing any medical procedure or treatment, try to avoid blood pressure being taken or needle sticks from occurring on the arm on the side of cancer.   This recommendation begins after surgery and continues for the rest of your life.  This may help reduce your risk of getting lymphedema (swelling in your arm). An excellent resource for those seeking information on lymphedema is the National Lymphedema Network's web site. It can be accessed at [place].org If you notice swelling in your hand, arm or breast at any time following surgery (even if it is many years from now), please contact your doctor or physical therapist to discuss this.  Lymphedema can be treated at any time but it is easier for you if it is treated early on.  If you feel like your shoulder motion is not returning to normal in a reasonable amount of time, please contact your surgeon or physical therapist.  [facility] [phone]. [address], [address], [place] [address]  ABC CLASS After Breast Cancer Class  After Breast Cancer Class is a specially designed exercise class to assist you in a safe recover after having breast cancer surgery.  In this class you will learn how to get back to full function whether your  drains were just removed or if you had surgery a month ago.  This one-time class is held the 1st and 3rd Monday of every month from 11:00 a.m. until 12:00 noon virtually.  This class is FREE and space is limited. For more information or to register for the next available class, call [phone].  Class Goals  Understand specific stretches to improve the flexibility of you chest and shoulder. Learn ways to safely strengthen your upper body and improve your posture. Understand the warning signs of infection and why you may be at risk for an arm infection. Learn about Lymphedema and prevention.  ** You do not attend this class until after surgery.  Drains must be removed to participate  Patient was instructed today in a home exercise program today for post op shoulder range of motion. These included active assist shoulder flexion in sitting, scapular retraction, wall walking with shoulder abduction, and hands behind head external rotation.  She was encouraged to do these twice a day, holding 3 seconds and repeating 5 times when permitted by her physician.    Isabel Caprice, PT 09/02/2022, 8:48 AM

## 2022-09-05 ENCOUNTER — Other Ambulatory Visit: Payer: Self-pay | Admitting: General Surgery

## 2022-09-05 DIAGNOSIS — C50412 Malignant neoplasm of upper-outer quadrant of left female breast: Secondary | ICD-10-CM | POA: Diagnosis not present

## 2022-09-06 ENCOUNTER — Other Ambulatory Visit: Payer: 59

## 2022-09-06 ENCOUNTER — Inpatient Hospital Stay (HOSPITAL_BASED_OUTPATIENT_CLINIC_OR_DEPARTMENT_OTHER): Payer: 59 | Admitting: Licensed Clinical Social Worker

## 2022-09-06 ENCOUNTER — Inpatient Hospital Stay: Payer: 59

## 2022-09-06 ENCOUNTER — Encounter: Payer: 59 | Admitting: Licensed Clinical Social Worker

## 2022-09-06 ENCOUNTER — Other Ambulatory Visit: Payer: Self-pay

## 2022-09-06 ENCOUNTER — Telehealth: Payer: Self-pay

## 2022-09-06 DIAGNOSIS — C50412 Malignant neoplasm of upper-outer quadrant of left female breast: Secondary | ICD-10-CM | POA: Diagnosis not present

## 2022-09-06 DIAGNOSIS — Z170 Estrogen receptor positive status [ER+]: Secondary | ICD-10-CM

## 2022-09-06 DIAGNOSIS — Z803 Family history of malignant neoplasm of breast: Secondary | ICD-10-CM | POA: Diagnosis not present

## 2022-09-06 DIAGNOSIS — E119 Type 2 diabetes mellitus without complications: Secondary | ICD-10-CM | POA: Diagnosis not present

## 2022-09-06 MED ORDER — XIIDRA 5 % OP SOLN
1.0000 [drp] | Freq: Two times a day (BID) | OPHTHALMIC | 4 refills | Status: DC
  Filled 2022-09-06 – 2022-10-11 (×2): qty 180, 90d supply, fill #0
  Filled 2023-01-09: qty 180, 90d supply, fill #1
  Filled 2023-04-11: qty 180, 90d supply, fill #2
  Filled 2023-08-06: qty 180, 90d supply, fill #3

## 2022-09-06 NOTE — Telephone Encounter (Signed)
FMLA paperwork has been faxed to [employer] at [phone] and receieved a fax confirmation.

## 2022-09-07 ENCOUNTER — Ambulatory Visit: Payer: 59

## 2022-09-07 ENCOUNTER — Encounter: Payer: Self-pay | Admitting: Licensed Clinical Social Worker

## 2022-09-07 DIAGNOSIS — Z170 Estrogen receptor positive status [ER+]: Secondary | ICD-10-CM | POA: Diagnosis not present

## 2022-09-07 DIAGNOSIS — R29898 Other symptoms and signs involving the musculoskeletal system: Secondary | ICD-10-CM | POA: Diagnosis not present

## 2022-09-07 DIAGNOSIS — M25612 Stiffness of left shoulder, not elsewhere classified: Secondary | ICD-10-CM

## 2022-09-07 DIAGNOSIS — C50412 Malignant neoplasm of upper-outer quadrant of left female breast: Secondary | ICD-10-CM | POA: Diagnosis not present

## 2022-09-07 NOTE — Therapy (Signed)
OUTPATIENT PHYSICAL THERAPY BREAST CANCER TREATMENT NOTE   Patient Name: Nichole Brown MRN: [id number] DOB:10/02/1966, 56 y.o., female Today's Date: 09/07/2022   PT End of Session - 09/07/22 0814     Visit Number 3    Date for PT Re-Evaluation 09/29/22    Authorization Type [place] [employer]    PT Start Time 0800    PT Stop Time 0844    PT Time Calculation (min) 44 min    Activity Tolerance Patient tolerated treatment well    Behavior During Therapy [facility] for tasks assessed/performed             Past Medical History:  Diagnosis Date   Anxiety    Bronchitis    Complication of anesthesia    PONV   Depression    Fibromyalgia 2005   GERD (gastroesophageal reflux disease)    Insomnia    Migraine headache    Pneumonia    PONV (postoperative nausea and vomiting)    Past Surgical History:  Procedure Laterality Date   ABDOMINAL HYSTERECTOMY     BALLOON DILATION N/A 12/14/2020   Procedure: BALLOON DILATION;  Surgeon: Eloise Harman, DO;  [facility];  Service: Endoscopy;  Laterality: N/A;   BIOPSY  12/14/2020   Procedure: BIOPSY;  Surgeon: Eloise Harman, DO;  [facility];  Service: Endoscopy;;   BREAST BIOPSY Left 08/24/2022   [place] bx, coil marker path pending   CESAREAN SECTION     CHOLECYSTECTOMY  1988   COLONOSCOPY N/A 12/10/2018    six 4-8 mm polyps in proximal descending colon, mid transverse and hepatic flexure. Diverticulosis. External and internal hemorrhoids. Torturous colon. Two simple adenomas, two serrated polyps, 2 hyperplastic polyps. Surveillance due Dec 2022.    ESOPHAGOGASTRODUODENOSCOPY (EGD) WITH PROPOFOL N/A 12/14/2020   Benign-appearing esophageal stenosis s/p dilation, gastritis, normal duodenum. Mild chronic gastritis.    open heart surgery  1967   PATENT DUCTUS ARTERIOUS REPAIR  1967   POLYPECTOMY  12/10/2018   Procedure: POLYPECTOMY;  Surgeon: Danie Binder, MD;  [facility];  Service: Endoscopy;;  hepatic  flexure, descending, transverse   SHOULDER ARTHROSCOPY Left 06/16/2017   Procedure: LEFT SHOULDER ARTHROSCOPY, DEBRIDEMENT, AND DECOMPRESSION;  Surgeon: Newt Minion, MD;  Location: [place];  Service: Orthopedics;  Laterality: Left;   SHOULDER ARTHROSCOPY WITH SUBACROMIAL DECOMPRESSION Left 10/03/2019   Procedure: SHOULDER ARTHROSCOPY WITH DEBRIDEMENT, DECOMPRESSION;  Surgeon: Corky Mull, MD;  Location: [facility] ORS;  Service: Orthopedics;  Laterality: Left;   SHOULDER SURGERY Left 2008   [facility] joint   Patient Active Problem List   Diagnosis Date Noted   Malignant neoplasm of upper-outer quadrant of left breast in female, estrogen receptor positive ([place]) 08/29/2022   Goals of care, counseling/discussion 08/29/2022   Non-recurrent acute suppurative otitis media of right ear without spontaneous rupture of tympanic membrane 03/09/2021   Sore throat 03/09/2021   Dysphagia 11/17/2020   Rhinosinusitis 10/20/2020   Night muscle spasms 10/20/2020   Depression, major, single episode, moderate ([place]) 01/01/2019   Generalized anxiety disorder 01/01/2019   Chest pain 12/24/2018   Wellness examination    Taking multiple medications for chronic disease 07/30/2018   Impingement syndrome of left shoulder    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   Gastroesophageal reflux disease 12/26/2016   Grief at loss of child 11/09/2016   Sternal fracture 10/06/2015   Contusion of leg 10/06/2015   Migraine headache 07/24/2013   Insomnia 05/30/2013   Fibromyalgia 05/30/2013  PCP: Threasa Alpha, NP   REFERRING PROVIDER: Dr. Luiz Iron DIAG: left breast cancer  THERAPY DIAG:  Malignant neoplasm of upper-outer quadrant of left breast in female, estrogen receptor positive ([place])  Stiffness of left shoulder, not elsewhere classified  Shoulder weakness  Rationale for Evaluation and Treatment Rehabilitation  ONSET DATE: 08/29/22  SUBJECTIVE                                                                                                                                                                                            SUBJECTIVE STATEMENT: Patient reports doing well.  No further issues since yesterday.     PERTINENT HISTORY:  This mass was biopsied and was consistent with invasive mammary carcinoma grade 1 ER positive greater than 90%, PR +31 to 40% and HER2 negative. Hx of 3 shoulder surgeries on the left prior to surgery.    PATIENT GOALS   reduce lymphedema risk and learn post op HEP.   PAIN:  Are you having pain? Yes: NPRS scale: 3/10 Pain location: front of left shoulder - feels like a catch Pain description: achy, sometimes sharp Aggravating factors: work Relieving factors: not using it much  PRECAUTIONS: Active CA   HAND DOMINANCE: right  WEIGHT BEARING RESTRICTIONS No  FALLS:  Has patient fallen in last 6 months? 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  LEISURE: walking   PRIOR LEVEL OF FUNCTION: Independent   OBJECTIVE  COGNITION:  Overall cognitive status: Within functional limits for tasks assessed    POSTURE:  Forward head and rounded shoulders posture  UPPER EXTREMITY AROM/PROM:  A/PROM RIGHT   eval   Shoulder extension 25 - pain  Shoulder flexion 140 - pain  Shoulder abduction 125- pain  Shoulder internal rotation   Shoulder external rotation 82 - pn    (Blank rows = not tested)  LYMPHEDEMA ASSESSMENTS:   LANDMARK RIGHT   eval  10 cm proximal to olecranon process 29.5  Olecranon process 25.5  10 cm proximal to ulnar styloid process 22.5  Just proximal to ulnar styloid process 15.8  Across hand at thumb web space 18.8  At base of 2nd digit 6.1  (Blank rows = not tested)  LANDMARK LEFT   eval  10 cm proximal to olecranon process 30.0  Olecranon process 25.5  10 cm proximal to ulnar styloid process 20.5  Just proximal to ulnar styloid process 16   Across hand at thumb web space 18.7  At base of 2nd digit 6.0  (Blank rows = not tested)   L-DEX LYMPHEDEMA  SCREENING: The patient was assessed using the L-Dex machine today to produce a lymphedema index baseline score. The patient will be reassessed on a regular basis (typically every 3 months) to obtain new L-Dex scores. If the score is > 6.5 points away from his/her baseline score indicating onset of subclinical lymphedema, it will be recommended to wear a compression garment for 4 weeks, 12 hours per day and then be reassessed. If the score continues to be > 6.5 points from baseline at reassessment, we will initiate lymphedema treatment. Assessing in this manner has a 95% rate of preventing clinically significant lymphedema.  TODAY"S TREATMENT Date: 09/07/22 Finger ladder flexion x 5, abduction x 5 Pulleys : flexion, scaption, IR x 2 min each Doorway stretch  x 2 min Fwd bent shoulder ext, row and horizontal abd x 20 ea   SL ER x 20 Supine serratus punch x 20 Side lying sleeper stretch x 10 hold 10 sec PROM all planes x 10 each holding 10 sec  Date: 09/02/22 Finger ladder flexion x 10 Pulleys : flexion, scaption, IR x 2 min each Doorway stretch  3 x 30 sec Side lying sleeper stretch x 10 hold 10 sec PROM all planes x 10 each holding 10 sec  Date: 09/01/22 HEP established - see below for post op and now leading up to surgery    PATIENT EDUCATION:  Education details: Lymphedema risk reduction and post op shoulder/posture HEP Person educated: Patient Education method: Explanation, Demonstration, Handout Education comprehension: Patient verbalized understanding and returned demonstration   HOME EXERCISE PROGRAM: Patient was instructed today in a home exercise program today for post op shoulder range of motion. These included active assist shoulder flexion in sitting, scapular retraction, wall walking with shoulder abduction, and hands behind head external rotation.  She was  encouraged to do these twice a day, holding 3 seconds and repeating 5 times when permitted by her physician. - Pt will start now due to shoulder status  Access Code: [email or web address] URL: [email or web address] Date: 09/07/2022 Prepared by: Candyce Churn  Exercises - Single Arm Bent Over Shoulder Extension with Dumbbell  - 1 x daily - 7 x weekly - 1 sets - 20 reps - Bent Over Single Arm Shoulder Row with Dumbbell  - 1 x daily - 7 x weekly - 1 sets - 20 reps - Single Arm Bent Over Shoulder Horizontal Abduction with Dumbbell - Palm Down  - 1 x daily - 7 x weekly - 1 sets - 20 reps - Sidelying Shoulder External Rotation  - 1 x daily - 7 x weekly - 1 sets - 20 reps - Supine Scapular Protraction in Flexion with Dumbbells  - 1 x daily - 7 x weekly - 1 sets - 20 reps  ASSESSMENT: CLINICAL IMPRESSION: Kalaysia tolerated session today with minimal pain.  We added light strengthening to improve Glenohumeral mechanics.  She was able to do these is fwd bent position with minimal fatigue.  She would benefit from continuing skilled PT until surgery date to prepare for ROM needs post op.   Initial eval: Pt is planning to have a left lumpectomy with SLNB and radiation at an unknown date.  She has a hx of 3 shoulder surgeries and the need for a RTSR sometime in the future.  Dr. Donne Hazel and pt would like to have her attempt some visits prior to surgery to minimize potential for side effects. She will benefit from a post op PT reassessment to determine needs and from L-Dex screens every  3 months for 2 years to detect subclinical lymphedema.  Pt will benefit from skilled therapeutic intervention to improve on the following deficits: Decreased knowledge of precautions, impaired UE functional use, pain, decreased ROM, postural dysfunction.   PT treatment/interventions: ADL/self-care home management, pt/family education, therapeutic exercise, manual therapy, re-evaluation  REHAB POTENTIAL: Good  CLINICAL  DECISION MAKING: Stable/uncomplicated  EVALUATION COMPLEXITY: Low   GOALS: Goals reviewed with patient? YES  LONG TERM GOALS: (STG=LTG)    Name Target Date Goal status  1 Pt will be able to verbalize understanding of pertinent lymphedema risk reduction practices relevant to her dx specifically related to skin care.  Baseline:  No knowledge 09/01/2022 Achieved at eval  2 Pt will be able to return demo and/or verbalize understanding of the post op HEP related to regaining shoulder ROM. Baseline:  No knowledge 09/01/2022 Achieved at eval  3 Pt will be able to verbalize understanding of the importance of attending the post op After Breast CA Class for further lymphedema risk reduction education and therapeutic exercise.  Baseline:  No knowledge 09/01/2022 Achieved at eval  4 Pt will demo she has regained full shoulder ROM and function post operatively compared to baselines.  Baseline: See objective measurements taken today. 10/12/22   5 Pt will improve Left shoulder AROM by at least 140 degrees to be able to tolerate radiation positioning 09/29/22       PLAN: PT FREQUENCY/DURATION: 2x per week up until surgery and then 1 post op visit    PLAN FOR NEXT SESSION: Lt shoulder PROM, AAROM activities, isometrics? To prepare for lumpectomy and radiation positioning. No modalities in the area.  Schedule out more visits until lumpectomy ortho/cancer side both fine and then 3 weeks post lumpectomy with cancer/Kara.     Patient will follow up at outpatient cancer rehab 3-4 weeks following surgery.  If the patient requires physical therapy at that time, a specific plan will be dictated and sent to the referring physician for approval. The patient was educated today on appropriate basic range of motion exercises to begin post operatively and the importance of attending the After Breast Cancer class following surgery.  Patient was educated today on lymphedema risk reduction practices as it pertains to  recommendations that will benefit the patient immediately following surgery.  She verbalized good understanding.    Physical Therapy Information for After Breast Cancer Surgery/Treatment:  Lymphedema is a swelling condition that you may be at risk for in your arm if you have lymph nodes removed from the armpit area.  After a sentinel node biopsy, the risk is approximately 5-9% and is higher after an axillary node dissection.  There is treatment available for this condition and it is not life-threatening.  Contact your physician or physical therapist with concerns. You may begin the 4 shoulder/posture exercises (see additional sheet) when permitted by your physician (typically a week after surgery).  If you have drains, you may need to wait until those are removed before beginning range of motion exercises.  A general recommendation is to not lift your arms above shoulder height until drains are removed.  These exercises should be done to your tolerance and gently.  This is not a "no pain/no gain" type of recovery so listen to your body and stretch into the range of motion that you can tolerate, stopping if you have pain.  If you are having immediate reconstruction, ask your plastic surgeon about doing exercises as he or she may want you to wait. We encourage  you to attend the free one time ABC (After Breast Cancer) class offered by [facility].  You will learn information related to lymphedema risk, prevention and treatment and additional exercises to regain mobility following surgery.  You can call [phone] for more information.  This is offered the 1st and 3rd Monday of each month.  You only attend the class one time. While undergoing any medical procedure or treatment, try to avoid blood pressure being taken or needle sticks from occurring on the arm on the side of cancer.   This recommendation begins after surgery and continues for the rest of your life.  This may help reduce  your risk of getting lymphedema (swelling in your arm). An excellent resource for those seeking information on lymphedema is the National Lymphedema Network's web site. It can be accessed at [place].org If you notice swelling in your hand, arm or breast at any time following surgery (even if it is many years from now), please contact your doctor or physical therapist to discuss this.  Lymphedema can be treated at any time but it is easier for you if it is treated early on.  If you feel like your shoulder motion is not returning to normal in a reasonable amount of time, please contact your surgeon or physical therapist.  [facility] [phone]. [address], [address], [place] [address]  ABC CLASS After Breast Cancer Class  After Breast Cancer Class is a specially designed exercise class to assist you in a safe recover after having breast cancer surgery.  In this class you will learn how to get back to full function whether your drains were just removed or if you had surgery a month ago.  This one-time class is held the 1st and 3rd Monday of every month from 11:00 a.m. until 12:00 noon virtually.  This class is FREE and space is limited. For more information or to register for the next available class, call [phone].  Class Goals  Understand specific stretches to improve the flexibility of you chest and shoulder. Learn ways to safely strengthen your upper body and improve your posture. Understand the warning signs of infection and why you may be at risk for an arm infection. Learn about Lymphedema and prevention.  ** You do not attend this class until after surgery.  Drains must be removed to participate  Patient was instructed today in a home exercise program today for post op shoulder range of motion. These included active assist shoulder flexion in sitting, scapular retraction, wall walking with shoulder abduction, and hands behind head  external rotation.  She was encouraged to do these twice a day, holding 3 seconds and repeating 5 times when permitted by her physician.    Anderson Malta B. Aashir Umholtz, PT 09/07/22 8:47 AM  [facility] [address] [place], [place] [address] Phone # [phone] Fax [phone]

## 2022-09-07 NOTE — Progress Notes (Signed)
REFERRING PROVIDER: Sindy Guadeloupe, MD [place],  [place] [address]  PRIMARY PROVIDER:  Remi Haggard, FNP  PRIMARY REASON FOR VISIT:  1. Malignant neoplasm of upper-outer quadrant of left breast in female, estrogen receptor positive ([place])   2. Family history of breast cancer      HISTORY OF PRESENT ILLNESS:   Ms. Nichole Brown, a 56 y.o. female, was seen for a [facility] cancer genetics consultation at the request of Dr. Janese Banks due to a personal and family history of breast cancer.  Nichole Brown presents to clinic today to discuss the possibility of a hereditary predisposition to cancer, genetic testing, and to further clarify her future cancer risks, as well as potential cancer risks for family members.   In 2023, at the age of 22, Nichole Brown was diagnosed with invasive mammary carcinoma of the left breast, ER/PR+, HER2-. The treatment plan includes lumpectomy which is scheduled for 10/19.   CANCER HISTORY:  Oncology History  Malignant neoplasm of upper-outer quadrant of left breast in female, estrogen receptor positive ([place])  08/29/2022 Initial Diagnosis   Malignant neoplasm of upper-outer quadrant of left breast in female, estrogen receptor positive ([place])   08/29/2022 Cancer Staging   Staging form: Breast, AJCC 8th Edition - Clinical stage from 08/29/2022: Stage IA (cT1b, cN0, cM0, G1, ER+, PR+, HER2-) - Signed by Sindy Guadeloupe, MD on 08/29/2022 Stage prefix: Initial diagnosis Histologic grading system: 3 grade system    RISK FACTORS:  Menarche was at age 29.  First live birth at age 39.  Ovaries intact: no.  Hysterectomy: yes.  Menopausal status: postmenopausal.  Colonoscopy: yes; normal.  Past Medical History:  Diagnosis Date   Anxiety    Bronchitis    Complication of anesthesia    PONV   Depression    Fibromyalgia 2005   GERD (gastroesophageal reflux disease)    Insomnia    Migraine headache    Pneumonia    PONV (postoperative nausea and vomiting)      Past Surgical History:  Procedure Laterality Date   ABDOMINAL HYSTERECTOMY     BALLOON DILATION N/A 12/14/2020   Procedure: BALLOON DILATION;  Surgeon: Eloise Harman, DO;  [facility];  Service: Endoscopy;  Laterality: N/A;   BIOPSY  12/14/2020   Procedure: BIOPSY;  Surgeon: Eloise Harman, DO;  [facility];  Service: Endoscopy;;   BREAST BIOPSY Left 08/24/2022   [place] bx, coil marker path pending   CESAREAN SECTION     CHOLECYSTECTOMY  1988   COLONOSCOPY N/A 12/10/2018    six 4-8 mm polyps in proximal descending colon, mid transverse and hepatic flexure. Diverticulosis. External and internal hemorrhoids. Torturous colon. Two simple adenomas, two serrated polyps, 2 hyperplastic polyps. Surveillance due Dec 2022.    ESOPHAGOGASTRODUODENOSCOPY (EGD) WITH PROPOFOL N/A 12/14/2020   Benign-appearing esophageal stenosis s/p dilation, gastritis, normal duodenum. Mild chronic gastritis.    open heart surgery  1967   PATENT DUCTUS ARTERIOUS REPAIR  1967   POLYPECTOMY  12/10/2018   Procedure: POLYPECTOMY;  Surgeon: Danie Binder, MD;  [facility];  Service: Endoscopy;;  hepatic flexure, descending, transverse   SHOULDER ARTHROSCOPY Left 06/16/2017   Procedure: LEFT SHOULDER ARTHROSCOPY, DEBRIDEMENT, AND DECOMPRESSION;  Surgeon: Newt Minion, MD;  Location: [place];  Service: Orthopedics;  Laterality: Left;   SHOULDER ARTHROSCOPY WITH SUBACROMIAL DECOMPRESSION Left 10/03/2019   Procedure: SHOULDER ARTHROSCOPY WITH DEBRIDEMENT, DECOMPRESSION;  Surgeon: Corky Mull, MD;  Location: [facility]  ORS;  Service: Orthopedics;  Laterality: Left;   SHOULDER SURGERY Left 2008   [facility] joint    FAMILY HISTORY:  We obtained a detailed, 4-generation family history.  Significant diagnoses are listed below: Family History  Problem Relation Age of Onset   Breast cancer Mother 62   Hypertension Father    Colon cancer Paternal Uncle        dx 24s   Hypertension Maternal  Grandmother    Diabetes Maternal Grandmother    Heart disease Maternal Grandmother 72       MI   Hypertension Maternal Grandfather    Throat cancer Maternal Grandfather 47   Nichole Brown has 2 sisters and 1 brother. She has 1 living daughter at 17 and a son who passed at 38.  Nichole Brown mother had breast cancer at 44 and passed at 31. Maternal grandfather had throat cancer and passed at 58. He had history of smoking. No other known cancers on this side of the family.  Nichole Brown father passed at 73. A paternal uncle had colon cancer and lung cancer (or colon that spread to lungs) in his 49s. No other known cancers on this side of the family.  Nichole Brown is unaware of previous family history of genetic testing for hereditary cancer risks. There is no reported Ashkenazi Jewish ancestry. There is no known consanguinity.  GENETIC COUNSELING ASSESSMENT: Nichole Brown is a 56 y.o. female with a personal and family history of breast cancer which is somewhat suggestive of a hereditary cancer syndrome and predisposition to cancer. We, therefore, discussed and recommended the following at today's visit.   DISCUSSION: We discussed that approximately 10% of breast cancer is hereditary. Most cases of hereditary breast cancer are associated with BRCA1/BRCA2 genes, although there are other genes associated with hereditary cancer as well. Cancers and risks are gene specific. We discussed that testing is beneficial for several reasons including knowing about cancer risks, identifying potential screening and risk-reduction options that may be appropriate, and to understand if other family members could be at risk for cancer and allow them to undergo genetic testing.   We reviewed the characteristics, features and inheritance patterns of hereditary cancer syndromes. We also discussed genetic testing, including the appropriate family members to test, the process of testing, insurance coverage and  turn-around-time for results. We discussed the implications of a negative, positive and/or variant of uncertain significant result. We recommended Nichole Brown pursue genetic testing for the [employer] (STAT)+CancerNext-Expanded+RNA gene panel.   Based on Nichole Brown's personal and family history of cancer, she meets medical criteria for genetic testing. Despite that she meets criteria, she may still have an out of pocket cost. We discussed that if her out of pocket cost for testing is over $100, the laboratory will call and confirm whether she wants to proceed with testing.  If the out of pocket cost of testing is less than $100 she will be billed by the genetic testing laboratory.   PLAN: After considering the risks, benefits, and limitations, Nichole Brown provided informed consent to pursue genetic testing and the blood sample was sent to [facility] for analysis of the [employer]+CancerNext-Expanded+RNA panel. Results should be available within approximately 5-12 days' time, at which point they will be disclosed by telephone to Nichole Brown, as will any additional recommendations warranted by these results. Nichole Brown will receive a summary of her genetic counseling visit and a copy of her results once available. This information will also be available in Epic.  Nichole Brown questions were answered to her satisfaction today. Our contact information was provided should additional questions or concerns arise. Thank you for the referral and allowing [place] to share in the care of your patient.   Faith Rogue, MS, [facility] Genetic Counselor [email or web address] Phone: [phone]  The patient was seen for a total of 25 minutes in face-to-face genetic counseling.  Dr. Grayland Ormond was available for discussion regarding this case.   _______________________________________________________________________ For Office Staff:  Number of people involved in session: 1 Was an Intern/ student  involved with case: no

## 2022-09-11 DIAGNOSIS — C801 Malignant (primary) neoplasm, unspecified: Secondary | ICD-10-CM

## 2022-09-11 HISTORY — DX: Malignant (primary) neoplasm, unspecified: C80.1

## 2022-09-13 ENCOUNTER — Telehealth: Payer: Self-pay | Admitting: Licensed Clinical Social Worker

## 2022-09-14 ENCOUNTER — Ambulatory Visit: Payer: 59 | Attending: General Surgery

## 2022-09-14 DIAGNOSIS — M25612 Stiffness of left shoulder, not elsewhere classified: Secondary | ICD-10-CM

## 2022-09-14 DIAGNOSIS — M899 Disorder of bone, unspecified: Secondary | ICD-10-CM | POA: Diagnosis not present

## 2022-09-14 DIAGNOSIS — M7521 Bicipital tendinitis, right shoulder: Secondary | ICD-10-CM | POA: Diagnosis not present

## 2022-09-14 DIAGNOSIS — Z170 Estrogen receptor positive status [ER+]: Secondary | ICD-10-CM | POA: Insufficient documentation

## 2022-09-14 DIAGNOSIS — R29898 Other symptoms and signs involving the musculoskeletal system: Secondary | ICD-10-CM | POA: Diagnosis not present

## 2022-09-14 DIAGNOSIS — M7581 Other shoulder lesions, right shoulder: Secondary | ICD-10-CM | POA: Diagnosis not present

## 2022-09-14 DIAGNOSIS — C50412 Malignant neoplasm of upper-outer quadrant of left female breast: Secondary | ICD-10-CM

## 2022-09-14 NOTE — Therapy (Signed)
OUTPATIENT PHYSICAL THERAPY BREAST CANCER TREATMENT NOTE   Patient Name: Nichole Brown MRN: [id number] DOB:09/09/1966, 56 y.o., female Today's Date: 09/14/2022   PT End of Session - 09/14/22 1023     Visit Number 4    Number of Visits 6    Date for PT Re-Evaluation 09/29/22    Authorization Type [employer]    PT Start Time 1017    PT Stop Time 1100    PT Time Calculation (min) 43 min    Activity Tolerance Patient tolerated treatment well    Behavior During Therapy WFL for tasks assessed/performed             Past Medical History:  Diagnosis Date   Anxiety    Bronchitis    Complication of anesthesia    PONV   Depression    Fibromyalgia 2005   GERD (gastroesophageal reflux disease)    Insomnia    Migraine headache    Pneumonia    PONV (postoperative nausea and vomiting)    Past Surgical History:  Procedure Laterality Date   ABDOMINAL HYSTERECTOMY     BALLOON DILATION N/A 12/14/2020   Procedure: BALLOON DILATION;  Surgeon: Eloise Harman, DO;  [facility];  Service: Endoscopy;  Laterality: N/A;   BIOPSY  12/14/2020   Procedure: BIOPSY;  Surgeon: Eloise Harman, DO;  [facility];  Service: Endoscopy;;   BREAST BIOPSY Left 08/24/2022   [place] bx, coil marker path pending   CESAREAN SECTION     CHOLECYSTECTOMY  1988   COLONOSCOPY N/A 12/10/2018    six 4-8 mm polyps in proximal descending colon, mid transverse and hepatic flexure. Diverticulosis. External and internal hemorrhoids. Torturous colon. Two simple adenomas, two serrated polyps, 2 hyperplastic polyps. Surveillance due Dec 2022.    ESOPHAGOGASTRODUODENOSCOPY (EGD) WITH PROPOFOL N/A 12/14/2020   Benign-appearing esophageal stenosis s/p dilation, gastritis, normal duodenum. Mild chronic gastritis.    open heart surgery  1967   PATENT DUCTUS ARTERIOUS REPAIR  1967   POLYPECTOMY  12/10/2018   Procedure: POLYPECTOMY;  Surgeon: Danie Binder, MD;  [facility];   Service: Endoscopy;;  hepatic flexure, descending, transverse   SHOULDER ARTHROSCOPY Left 06/16/2017   Procedure: LEFT SHOULDER ARTHROSCOPY, DEBRIDEMENT, AND DECOMPRESSION;  Surgeon: Newt Minion, MD;  Location: [place];  Service: Orthopedics;  Laterality: Left;   SHOULDER ARTHROSCOPY WITH SUBACROMIAL DECOMPRESSION Left 10/03/2019   Procedure: SHOULDER ARTHROSCOPY WITH DEBRIDEMENT, DECOMPRESSION;  Surgeon: Corky Mull, MD;  Location: [facility];  Service: Orthopedics;  Laterality: Left;   SHOULDER SURGERY Left 2008   [facility] joint   Patient Active Problem List   Diagnosis Date Noted   Malignant neoplasm of upper-outer quadrant of left breast in female, estrogen receptor positive ([place]) 08/29/2022   Goals of care, counseling/discussion 08/29/2022   Non-recurrent acute suppurative otitis media of right ear without spontaneous rupture of tympanic membrane 03/09/2021   Sore throat 03/09/2021   Dysphagia 11/17/2020   Rhinosinusitis 10/20/2020   Night muscle spasms 10/20/2020   Depression, major, single episode, moderate ([place]) 01/01/2019   Generalized anxiety disorder 01/01/2019   Chest pain 12/24/2018   Wellness examination    Taking multiple medications for chronic disease 07/30/2018   Impingement syndrome of left shoulder    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   Gastroesophageal reflux disease 12/26/2016   Grief at loss of child 11/09/2016   Sternal fracture 10/06/2015   Contusion of leg 10/06/2015   Migraine headache 07/24/2013  Insomnia 05/30/2013   Fibromyalgia 05/30/2013    PCP: Threasa Alpha, NP   REFERRING PROVIDER: Dr. Luiz Iron DIAG: left breast cancer  THERAPY DIAG:  Malignant neoplasm of upper-outer quadrant of left breast in female, estrogen receptor positive ([place])  Stiffness of left shoulder, not elsewhere classified  Shoulder weakness  Rationale for Evaluation and Treatment Rehabilitation  ONSET DATE: 08/29/22  SUBJECTIVE                                                                                                                                                                                            SUBJECTIVE STATEMENT: Patient reports she saw ortho MD this morning and got a cortisone shot in her right shoulder.  "He doesn't want me to do anything with the right shoulder".  No issues or increased pain with the left shoulder.      PERTINENT HISTORY:  This mass was biopsied and was consistent with invasive mammary carcinoma grade 1 ER positive greater than 90%, PR +31 to 40% and HER2 negative. Hx of 3 shoulder surgeries on the left prior to surgery.    PATIENT GOALS   reduce lymphedema risk and learn post op HEP.   PAIN:  Are you having pain? Yes: NPRS scale: 3/10 Pain location: front of left shoulder - feels like a catch Pain description: achy, sometimes sharp Aggravating factors: work Relieving factors: not using it much  PRECAUTIONS: Active CA   HAND DOMINANCE: right  WEIGHT BEARING RESTRICTIONS No  FALLS:  Has patient fallen in last 6 months? 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  LEISURE: walking   PRIOR LEVEL OF FUNCTION: Independent   OBJECTIVE  COGNITION:  Overall cognitive status: Within functional limits for tasks assessed    POSTURE:  Forward head and rounded shoulders posture  UPPER EXTREMITY AROM/PROM:  A/PROM RIGHT   eval   Shoulder extension 25 - pain  Shoulder flexion 140 - pain  Shoulder abduction 125- pain  Shoulder internal rotation   Shoulder external rotation 82 - pn    (Blank rows = not tested)  LYMPHEDEMA ASSESSMENTS:   LANDMARK RIGHT   eval  10 cm proximal to olecranon process 29.5  Olecranon process 25.5  10 cm proximal to ulnar styloid process 22.5  Just proximal to ulnar styloid process 15.8  Across hand at thumb web space 18.8  At base of 2nd digit 6.1  (Blank rows = not tested)  LANDMARK  LEFT   eval  10 cm proximal to olecranon process 30.0  Olecranon process 25.5  10 cm  proximal to ulnar styloid process 20.5  Just proximal to ulnar styloid process 16  Across hand at thumb web space 18.7  At base of 2nd digit 6.0  (Blank rows = not tested)   L-DEX LYMPHEDEMA SCREENING: The patient was assessed using the L-Dex machine today to produce a lymphedema index baseline score. The patient will be reassessed on a regular basis (typically every 3 months) to obtain new L-Dex scores. If the score is > 6.5 points away from his/her baseline score indicating onset of subclinical lymphedema, it will be recommended to wear a compression garment for 4 weeks, 12 hours per day and then be reassessed. If the score continues to be > 6.5 points from baseline at reassessment, we will initiate lymphedema treatment. Assessing in this manner has a 95% rate of preventing clinically significant lymphedema.  TODAY"S TREATMENT Date: 09/14/22 Finger ladder flexion x 10, abduction x 10 Doorway stretch  for pectoralis left side only (due to right shoulder injected this morning) x 10 holding 10 sec Doorway stretch for parascapular areas left side x 10 hold 10 sec Fwd bent shoulder ext, row and horizontal abd x 20 each left  Supine serratus punch x 20 Side lying sleeper stretch x 10 hold 10 sec PROM all planes x 10 each holding 10 sec  Date: 09/07/22 Finger ladder flexion x 5, abduction x 5 Pulleys : flexion, scaption, IR x 2 min each Doorway stretch  x 2 min Fwd bent shoulder ext, row and horizontal abd x 20 ea   SL ER x 20 Supine serratus punch x 20 Side lying sleeper stretch x 10 hold 10 sec PROM all planes x 10 each holding 10 sec  Date: 09/02/22 Finger ladder flexion x 10 Pulleys : flexion, scaption, IR x 2 min each Doorway stretch  3 x 30 sec Side lying sleeper stretch x 10 hold 10 sec PROM all planes x 10 each holding 10 sec  Date: 09/01/22 HEP established - see below for post op and now  leading up to surgery    PATIENT EDUCATION:  Education details: Lymphedema risk reduction and post op shoulder/posture HEP Person educated: Patient Education method: Explanation, Demonstration, Handout Education comprehension: Patient verbalized understanding and returned demonstration   HOME EXERCISE PROGRAM: Access Code: [email or web address] URL: [email or web address] Date: 09/14/2022 Prepared by: Candyce Churn  Exercises - Single Arm Bent Over Shoulder Extension with Dumbbell  - 1 x daily - 7 x weekly - 1 sets - 20 reps - Bent Over Single Arm Shoulder Row with Dumbbell  - 1 x daily - 7 x weekly - 1 sets - 20 reps - Single Arm Bent Over Shoulder Horizontal Abduction with Dumbbell - Palm Down  - 1 x daily - 7 x weekly - 1 sets - 20 reps - Sidelying Shoulder External Rotation  - 1 x daily - 7 x weekly - 1 sets - 20 reps - Supine Scapular Protraction in Flexion with Dumbbells  - 1 x daily - 7 x weekly - 1 sets - 20 reps - Single Arm Doorway Pec Stretch at 120 Degrees Abduction  - 1 x daily - 7 x weekly - 1 sets - 10 reps - 10 hold - Doorway Rhomboid Stretch  - 1 x daily - 7 x weekly - 1 sets - 10 reps - 10 holdPatient was instructed today in a home exercise program today for post op shoulder range of motion. These included active assist shoulder flexion in sitting, scapular retraction, wall walking with shoulder abduction, and  hands behind head external rotation.  She was encouraged to do these twice a day, holding 3 seconds and repeating 5 times when permitted by her physician. - Pt will start now due to shoulder status  Access Code: [email or web address] URL: [email or web address] Date: 09/07/2022 Prepared by: Candyce Churn  Exercises - Single Arm Bent Over Shoulder Extension with Dumbbell  - 1 x daily - 7 x weekly - 1 sets - 20 reps - Bent Over Single Arm Shoulder Row with Dumbbell  - 1 x daily - 7 x weekly - 1 sets - 20 reps - Single Arm Bent Over Shoulder Horizontal  Abduction with Dumbbell - Palm Down  - 1 x daily - 7 x weekly - 1 sets - 20 reps - Sidelying Shoulder External Rotation  - 1 x daily - 7 x weekly - 1 sets - 20 reps - Supine Scapular Protraction in Flexion with Dumbbells  - 1 x daily - 7 x weekly - 1 sets - 20 reps  ASSESSMENT: CLINICAL IMPRESSION: Annalie is progressing appropriately.    She continues to be restricted in ER and IR but is diligent with her HEP.  She would benefit from continuing skilled PT until surgery date to prepare for ROM needs post op.   Initial eval: Pt is planning to have a left lumpectomy with SLNB and radiation at an unknown date.  She has a hx of 3 shoulder surgeries and the need for a RTSR sometime in the future.  Dr. Donne Hazel and pt would like to have her attempt some visits prior to surgery to minimize potential for side effects. She will benefit from a post op PT reassessment to determine needs and from L-Dex screens every 3 months for 2 years to detect subclinical lymphedema.  Pt will benefit from skilled therapeutic intervention to improve on the following deficits: Decreased knowledge of precautions, impaired UE functional use, pain, decreased ROM, postural dysfunction.   PT treatment/interventions: ADL/self-care home management, pt/family education, therapeutic exercise, manual therapy, re-evaluation  REHAB POTENTIAL: Good  CLINICAL DECISION MAKING: Stable/uncomplicated  EVALUATION COMPLEXITY: Low   GOALS: Goals reviewed with patient? YES  LONG TERM GOALS: (STG=LTG)    Name Target Date Goal status  1 Pt will be able to verbalize understanding of pertinent lymphedema risk reduction practices relevant to her dx specifically related to skin care.  Baseline:  No knowledge 09/01/2022 Achieved at eval  2 Pt will be able to return demo and/or verbalize understanding of the post op HEP related to regaining shoulder ROM. Baseline:  No knowledge 09/01/2022 Achieved at eval  3 Pt will be able to verbalize  understanding of the importance of attending the post op After Breast CA Class for further lymphedema risk reduction education and therapeutic exercise.  Baseline:  No knowledge 09/01/2022 Achieved at eval  4 Pt will demo she has regained full shoulder ROM and function post operatively compared to baselines.  Baseline: See objective measurements taken today. 10/12/22   5 Pt will improve Left shoulder AROM by at least 140 degrees to be able to tolerate radiation positioning 09/29/22       PLAN: PT FREQUENCY/DURATION: 2x per week up until surgery and then 1 post op visit    PLAN FOR NEXT SESSION: Lt shoulder PROM, AAROM activities, isometrics? To prepare for lumpectomy and radiation positioning. No modalities in the area.  Schedule out more visits until lumpectomy ortho/cancer side both fine and then 3 weeks post lumpectomy with cancer/[place].     Patient will follow up  at outpatient cancer rehab 3-4 weeks following surgery.  If the patient requires physical therapy at that time, a specific plan will be dictated and sent to the referring physician for approval. The patient was educated today on appropriate basic range of motion exercises to begin post operatively and the importance of attending the After Breast Cancer class following surgery.  Patient was educated today on lymphedema risk reduction practices as it pertains to recommendations that will benefit the patient immediately following surgery.  She verbalized good understanding.    Physical Therapy Information for After Breast Cancer Surgery/Treatment:  Lymphedema is a swelling condition that you may be at risk for in your arm if you have lymph nodes removed from the armpit area.  After a sentinel node biopsy, the risk is approximately 5-9% and is higher after an axillary node dissection.  There is treatment available for this condition and it is not life-threatening.  Contact your physician or physical therapist with concerns. You may begin  the 4 shoulder/posture exercises (see additional sheet) when permitted by your physician (typically a week after surgery).  If you have drains, you may need to wait until those are removed before beginning range of motion exercises.  A general recommendation is to not lift your arms above shoulder height until drains are removed.  These exercises should be done to your tolerance and gently.  This is not a "no pain/no gain" type of recovery so listen to your body and stretch into the range of motion that you can tolerate, stopping if you have pain.  If you are having immediate reconstruction, ask your plastic surgeon about doing exercises as he or she may want you to wait. We encourage you to attend the free one time ABC (After Breast Cancer) class offered by [facility].  You will learn information related to lymphedema risk, prevention and treatment and additional exercises to regain mobility following surgery.  You can call [phone] for more information.  This is offered the 1st and 3rd Monday of each month.  You only attend the class one time. While undergoing any medical procedure or treatment, try to avoid blood pressure being taken or needle sticks from occurring on the arm on the side of cancer.   This recommendation begins after surgery and continues for the rest of your life.  This may help reduce your risk of getting lymphedema (swelling in your arm). An excellent resource for those seeking information on lymphedema is the National Lymphedema Network's web site. It can be accessed at [place].org If you notice swelling in your hand, arm or breast at any time following surgery (even if it is many years from now), please contact your doctor or physical therapist to discuss this.  Lymphedema can be treated at any time but it is easier for you if it is treated early on.  If you feel like your shoulder motion is not returning to normal in a reasonable amount of time, please  contact your surgeon or physical therapist.  [facility] [phone]. [address], [place] [address]  ABC CLASS After Breast Cancer Class  After Breast Cancer Class is a specially designed exercise class to assist you in a safe recover after having breast cancer surgery.  In this class you will learn how to get back to full function whether your drains were just removed or if you had surgery a month ago.  This one-time class is held the 1st and  3rd Monday of every month from 11:00 a.m. until 12:00 noon virtually.  This class is FREE and space is limited. For more information or to register for the next available class, call [phone].  Class Goals  Understand specific stretches to improve the flexibility of you chest and shoulder. Learn ways to safely strengthen your upper body and improve your posture. Understand the warning signs of infection and why you may be at risk for an arm infection. Learn about Lymphedema and prevention.  ** You do not attend this class until after surgery.  Drains must be removed to participate  Patient was instructed today in a home exercise program today for post op shoulder range of motion. These included active assist shoulder flexion in sitting, scapular retraction, wall walking with shoulder abduction, and hands behind head external rotation.  She was encouraged to do these twice a day, holding 3 seconds and repeating 5 times when permitted by her physician.    Anderson Malta B. Almina Schul, PT 09/14/22 11:02 AM   [facility] [address], [place], [place] [address] Phone # [phone] Fax [phone]

## 2022-09-15 NOTE — Telephone Encounter (Signed)
I contacted [name] to discuss her  initial (STAT) genetic testing results. No pathogenic variants were identified in the initial 8 genes analyzed. Remainder of testing is pending, I will contact [name] when it is back.       Faith Rogue, MS, [facility] Genetic Counselor [email or web address] Phone: [phone]

## 2022-09-16 ENCOUNTER — Encounter: Payer: Self-pay | Admitting: Oncology

## 2022-09-19 ENCOUNTER — Telehealth: Payer: Self-pay | Admitting: Licensed Clinical Social Worker

## 2022-09-20 ENCOUNTER — Ambulatory Visit: Payer: Self-pay | Admitting: Licensed Clinical Social Worker

## 2022-09-20 ENCOUNTER — Encounter: Payer: Self-pay | Admitting: Licensed Clinical Social Worker

## 2022-09-20 ENCOUNTER — Ambulatory Visit: Payer: 59

## 2022-09-20 DIAGNOSIS — Z1379 Encounter for other screening for genetic and chromosomal anomalies: Secondary | ICD-10-CM | POA: Insufficient documentation

## 2022-09-20 DIAGNOSIS — Z170 Estrogen receptor positive status [ER+]: Secondary | ICD-10-CM

## 2022-09-20 DIAGNOSIS — R29898 Other symptoms and signs involving the musculoskeletal system: Secondary | ICD-10-CM | POA: Diagnosis not present

## 2022-09-20 DIAGNOSIS — M25612 Stiffness of left shoulder, not elsewhere classified: Secondary | ICD-10-CM

## 2022-09-20 DIAGNOSIS — C50412 Malignant neoplasm of upper-outer quadrant of left female breast: Secondary | ICD-10-CM | POA: Diagnosis not present

## 2022-09-20 NOTE — Progress Notes (Signed)
HPI:   Nichole Brown was previously seen in the [facility] clinic due to a personal and family history of cancer and concerns regarding a hereditary predisposition to cancer. Please refer to our prior cancer genetics clinic note for more information regarding our discussion, assessment and recommendations, at the time. Nichole Brown recent genetic test results were disclosed to her, as were recommendations warranted by these results. These results and recommendations are discussed in more detail below.  CANCER HISTORY:  Oncology History  Malignant neoplasm of upper-outer quadrant of left breast in female, estrogen receptor positive ([place])  08/29/2022 Initial Diagnosis   Malignant neoplasm of upper-outer quadrant of left breast in female, estrogen receptor positive ([place])   08/29/2022 Cancer Staging   Staging form: Breast, AJCC 8th Edition - Clinical stage from 08/29/2022: Stage IA (cT1b, cN0, cM0, G1, ER+, PR+, HER2-) - Signed by Sindy Guadeloupe, MD on 08/29/2022 Stage prefix: Initial diagnosis Histologic grading system: 3 grade system    Genetic Testing   Negative genetic testing. No pathogenic variants identified on the [facility] CancerNext-Expanded+RNA panel. The report date is 09/16/2022.  The CancerNext-Expanded + RNAinsight gene panel offered by [employer] and includes sequencing and rearrangement analysis for the following 77 genes: IP, ALK, APC*, ATM*, AXIN2, BAP1, BARD1, BLM, BMPR1A, BRCA1*, BRCA2*, BRIP1*, CDC73, CDH1*,CDK4, CDKN1B, CDKN2A, CHEK2*, CTNNA1, DICER1, FANCC, FH, FLCN, GALNT12, KIF1B, LZTR1, MAX, MEN1, MET, MLH1*, MSH2*, MSH3, MSH6*, MUTYH*, NBN, NF1*, NF2, NTHL1, PALB2*, PHOX2B, PMS2*, POT1, PRKAR1A, PTCH1, PTEN*, RAD51C*, RAD51D*,RB1, RECQL, RET, SDHA, SDHAF2, SDHB, SDHC, SDHD, SMAD4, SMARCA4, SMARCB1, SMARCE1, STK11, SUFU, TMEM127, TP53*,TSC1, TSC2, VHL and XRCC2 (sequencing and deletion/duplication); EGFR, EGLN1, HOXB13, KIT, MITF, PDGFRA, POLD1 and POLE  (sequencing only); EPCAM and GREM1 (deletion/duplication only).     FAMILY HISTORY:  We obtained a detailed, 4-generation family history.  Significant diagnoses are listed below: Family History  Problem Relation Age of Onset   Breast cancer Mother 30   Hypertension Father    Colon cancer Paternal Uncle        dx 36s   Hypertension Maternal Grandmother    Diabetes Maternal Grandmother    Heart disease Maternal Grandmother 18       MI   Hypertension Maternal Grandfather    Throat cancer Maternal Grandfather 32   Nichole Brown has 2 sisters and 1 brother. She has 1 living daughter at 13 and a son who passed at 61.   Nichole Brown mother had breast cancer at 69 and passed at 34. Maternal grandfather had throat cancer and passed at 56. He had history of smoking. No other known cancers on this side of the family.   Nichole Brown father passed at 23. A paternal uncle had colon cancer and lung cancer (or colon that spread to lungs) in his 57s. No other known cancers on this side of the family.   Nichole Brown is unaware of previous family history of genetic testing for hereditary cancer risks. There is no reported Ashkenazi Jewish ancestry. There is no known consanguinity.   GENETIC TEST RESULTS:  The [facility] CancerNext-Expanded+RNA Panel found no pathogenic mutations.   The CancerNext-Expanded + RNAinsight gene panel offered by [employer] and includes sequencing and rearrangement analysis for the following 77 genes: IP, ALK, APC*, ATM*, AXIN2, BAP1, BARD1, BLM, BMPR1A, BRCA1*, BRCA2*, BRIP1*, CDC73, CDH1*,CDK4, CDKN1B, CDKN2A, CHEK2*, CTNNA1, DICER1, FANCC, FH, FLCN, GALNT12, KIF1B, LZTR1, MAX, MEN1, MET, MLH1*, MSH2*, MSH3, MSH6*, MUTYH*, NBN, NF1*, NF2, NTHL1, PALB2*, PHOX2B, PMS2*, POT1, PRKAR1A, PTCH1, PTEN*, RAD51C*,  RAD51D*,RB1, RECQL, RET, SDHA, SDHAF2, SDHB, SDHC, SDHD, SMAD4, SMARCA4, SMARCB1, SMARCE1, STK11, SUFU, TMEM127, TP53*,TSC1, TSC2, VHL and XRCC2 (sequencing and  deletion/duplication); EGFR, EGLN1, HOXB13, KIT, MITF, PDGFRA, POLD1 and POLE (sequencing only); EPCAM and GREM1 (deletion/duplication only).   The test report has been scanned into EPIC and is located under the Molecular Pathology section of the Results Review tab.  A portion of the result report is included below for reference. Genetic testing reported out on 09/16/2022.      Even though a pathogenic variant was not identified, possible explanations for the cancer in the family may include: There may be no hereditary risk for cancer in the family. The cancers in Nichole Brown and/or her family may be sporadic/familial or due to other genetic and environmental factors. There may be a gene mutation in one of these genes that current testing methods cannot detect but that chance is small. There could be another gene that has not yet been discovered, or that we have not yet tested, that is responsible for the cancer diagnoses in the family.  It is also possible there is a hereditary cause for the cancer in the family that Nichole Brown did not inherit.  Therefore, it is important to remain in touch with cancer genetics in the future so that we can continue to offer Nichole Brown the most up to date genetic testing.    ADDITIONAL GENETIC TESTING:  We discussed with Nichole Brown that her genetic testing was fairly extensive.  If there are additional relevant genes identified to increase cancer risk that can be analyzed in the future, we would be happy to discuss and coordinate this testing at that time.     CANCER SCREENING RECOMMENDATIONS:  Nichole Brown test result is considered negative (normal).  This means that we have not identified a hereditary cause for her personal and family history of cancer at this time.   An individual's cancer risk and medical management are not determined by genetic test results alone. Overall cancer risk assessment incorporates additional factors, including personal  medical history, family history, and any available genetic information that may result in a personalized plan for cancer prevention and surveillance. Therefore, it is recommended she continue to follow the cancer management and screening guidelines provided by her oncology and primary healthcare provider.  RECOMMENDATIONS FOR FAMILY MEMBERS:   Since she did not inherit a identifiable mutation in a cancer predisposition gene included on this panel, her children could not have inherited a known mutation from her in one of these genes. Individuals in this family might be at some increased risk of developing cancer, over the general population risk, due to the family history of cancer.  Individuals in the family should notify their providers of the family history of cancer. We recommend women in this family have a yearly mammogram beginning at age 63, or 32 years younger than the earliest onset of cancer, an annual clinical breast exam, and perform monthly breast self-exams.  Family members should have colonoscopies by at age 73, or earlier, as recommended by their providers.  FOLLOW-UP:  Lastly, we discussed with Nichole Brown that cancer genetics is a rapidly advancing field and it is possible that new genetic tests will be appropriate for her and/or her family members in the future. We encouraged her to remain in contact with cancer genetics on an annual basis so we can update her personal and family histories and let her know of advances in cancer genetics that may benefit this  family.   Our contact number was provided. Nichole Brown questions were answered to her satisfaction, and she knows she is welcome to call us at anytime with additional questions or concerns.    Faith Rogue, MS, [facility] Genetic Counselor [email or web address] Phone: [phone]

## 2022-09-20 NOTE — Therapy (Signed)
OUTPATIENT PHYSICAL THERAPY BREAST CANCER TREATMENT NOTE   Patient Name: Nichole Brown MRN: [id number] DOB:1966-10-19, 56 y.o., female Today's Date: 09/20/2022   PT End of Session - 09/20/22 0803     Visit Number 5    Number of Visits 6    Date for PT Re-Evaluation 09/29/22    Authorization Type [place] [employer]    PT Start Time 0803    PT Stop Time 0845    PT Time Calculation (min) 42 min    Activity Tolerance Patient tolerated treatment well    Behavior During Therapy [facility] for tasks assessed/performed             Past Medical History:  Diagnosis Date   Anxiety    Bronchitis    Complication of anesthesia    PONV   Depression    Fibromyalgia 2005   GERD (gastroesophageal reflux disease)    Insomnia    Migraine headache    Pneumonia    PONV (postoperative nausea and vomiting)    Past Surgical History:  Procedure Laterality Date   ABDOMINAL HYSTERECTOMY     BALLOON DILATION N/A 12/14/2020   Procedure: BALLOON DILATION;  Surgeon: Eloise Harman, DO;  [facility];  Service: Endoscopy;  Laterality: N/A;   BIOPSY  12/14/2020   Procedure: BIOPSY;  Surgeon: Eloise Harman, DO;  [facility];  Service: Endoscopy;;   BREAST BIOPSY Left 08/24/2022   [place] bx, coil marker path pending   CESAREAN SECTION     CHOLECYSTECTOMY  1988   COLONOSCOPY N/A 12/10/2018    six 4-8 mm polyps in proximal descending colon, mid transverse and hepatic flexure. Diverticulosis. External and internal hemorrhoids. Torturous colon. Two simple adenomas, two serrated polyps, 2 hyperplastic polyps. Surveillance due Dec 2022.    ESOPHAGOGASTRODUODENOSCOPY (EGD) WITH PROPOFOL N/A 12/14/2020   Benign-appearing esophageal stenosis s/p dilation, gastritis, normal duodenum. Mild chronic gastritis.    open heart surgery  1967   PATENT DUCTUS ARTERIOUS REPAIR  1967   POLYPECTOMY  12/10/2018   Procedure: POLYPECTOMY;  Surgeon: Danie Binder, MD;  [facility];   Service: Endoscopy;;  hepatic flexure, descending, transverse   SHOULDER ARTHROSCOPY Left 06/16/2017   Procedure: LEFT SHOULDER ARTHROSCOPY, DEBRIDEMENT, AND DECOMPRESSION;  Surgeon: Newt Minion, MD;  Location: [facility];  Service: Orthopedics;  Laterality: Left;   SHOULDER ARTHROSCOPY WITH SUBACROMIAL DECOMPRESSION Left 10/03/2019   Procedure: SHOULDER ARTHROSCOPY WITH DEBRIDEMENT, DECOMPRESSION;  Surgeon: Corky Mull, MD;  Location: [facility];  Service: Orthopedics;  Laterality: Left;   SHOULDER SURGERY Left 2008   [facility] joint   Patient Active Problem List   Diagnosis Date Noted   Malignant neoplasm of upper-outer quadrant of left breast in female, estrogen receptor positive ([place]) 08/29/2022   Goals of care, counseling/discussion 08/29/2022   Non-recurrent acute suppurative otitis media of right ear without spontaneous rupture of tympanic membrane 03/09/2021   Sore throat 03/09/2021   Dysphagia 11/17/2020   Rhinosinusitis 10/20/2020   Night muscle spasms 10/20/2020   Depression, major, single episode, moderate ([place]) 01/01/2019   Generalized anxiety disorder 01/01/2019   Chest pain 12/24/2018   Wellness examination    Taking multiple medications for chronic disease 07/30/2018   Impingement syndrome of left shoulder    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   Gastroesophageal reflux disease 12/26/2016   Grief at loss of child 11/09/2016   Sternal fracture 10/06/2015   Contusion of leg 10/06/2015   Migraine headache 07/24/2013  Insomnia 05/30/2013   Fibromyalgia 05/30/2013    PCP: Threasa Alpha, NP   REFERRING PROVIDER: Dr. Luiz Iron DIAG: left breast cancer  THERAPY DIAG:  Malignant neoplasm of upper-outer quadrant of left breast in female, estrogen receptor positive ([place])  Stiffness of left shoulder, not elsewhere classified  Shoulder weakness  Rationale for Evaluation and Treatment Rehabilitation  ONSET DATE: 08/29/22  SUBJECTIVE                                                                                                                                                                                            SUBJECTIVE STATEMENT: Patient reports right shoulder is more sore today but her left shoulder (side of upcoming breast surgery) is improving.        PERTINENT HISTORY:  This mass was biopsied and was consistent with invasive mammary carcinoma grade 1 ER positive greater than 90%, PR +31 to 40% and HER2 negative. Hx of 3 shoulder surgeries on the left prior to surgery.    PATIENT GOALS   reduce lymphedema risk and learn post op HEP.   PAIN:  Are you having pain? Yes: NPRS scale: 3/10 Pain location: front of left shoulder - feels like a catch Pain description: achy, sometimes sharp Aggravating factors: work Relieving factors: not using it much  PRECAUTIONS: Active CA   HAND DOMINANCE: right  WEIGHT BEARING RESTRICTIONS No  FALLS:  Has patient fallen in last 6 months? 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  LEISURE: walking   PRIOR LEVEL OF FUNCTION: Independent   OBJECTIVE  COGNITION:  Overall cognitive status: Within functional limits for tasks assessed    POSTURE:  Forward head and rounded shoulders posture  UPPER EXTREMITY AROM/PROM:  A/PROM RIGHT   eval   Shoulder extension 25 - pain  Shoulder flexion 140 - pain  Shoulder abduction 125- pain  Shoulder internal rotation   Shoulder external rotation 82 - pn    (Blank rows = not tested)  LYMPHEDEMA ASSESSMENTS:   LANDMARK RIGHT   eval  10 cm proximal to olecranon process 29.5  Olecranon process 25.5  10 cm proximal to ulnar styloid process 22.5  Just proximal to ulnar styloid process 15.8  Across hand at thumb web space 18.8  At base of 2nd digit 6.1  (Blank rows = not tested)  LANDMARK LEFT   eval  10 cm proximal to olecranon process 30.0  Olecranon process 25.5  10  cm proximal to ulnar styloid process 20.5  Just proximal to ulnar styloid process 16  Across hand at  thumb web space 18.7  At base of 2nd digit 6.0  (Blank rows = not tested)   L-DEX LYMPHEDEMA SCREENING: The patient was assessed using the L-Dex machine today to produce a lymphedema index baseline score. The patient will be reassessed on a regular basis (typically every 3 months) to obtain new L-Dex scores. If the score is > 6.5 points away from his/her baseline score indicating onset of subclinical lymphedema, it will be recommended to wear a compression garment for 4 weeks, 12 hours per day and then be reassessed. If the score continues to be > 6.5 points from baseline at reassessment, we will initiate lymphedema treatment. Assessing in this manner has a 95% rate of preventing clinically significant lymphedema.  TODAY"S TREATMENT Date: 09/20/22 Finger ladder flexion x 10, abduction x 10 4 D ball rolls for stabilization Doorway stretch  for pectoralis left side only (due to right shoulder injected this morning) x 10 holding 10 sec Doorway stretch for parascapular areas left side x 10 hold 10 sec Fwd bent shoulder ext, row and horizontal abd x 20 each left with 1 lb Supine serratus punch x 20 with 1 lb Side lying sleeper stretch x 10 hold 10 sec PROM all planes x 10 each holding 10 sec  Date: 09/14/22 Finger ladder flexion x 10, abduction x 10 Doorway stretch  for pectoralis left side only (due to right shoulder injected this morning) x 10 holding 10 sec Doorway stretch for parascapular areas left side x 10 hold 10 sec Fwd bent shoulder ext, row and horizontal abd x 20 each left  Supine serratus punch x 20 Side lying sleeper stretch x 10 hold 10 sec PROM all planes x 10 each holding 10 sec  Date: 09/07/22 Finger ladder flexion x 5, abduction x 5 Pulleys : flexion, scaption, IR x 2 min each Doorway stretch  x 2 min Fwd bent shoulder ext, row and horizontal abd x 20 ea   SL ER x  20 Supine serratus punch x 20 Side lying sleeper stretch x 10 hold 10 sec PROM all planes x 10 each holding 10 sec    PATIENT EDUCATION:  Education details: Lymphedema risk reduction and post op shoulder/posture HEP Person educated: Patient Education method: Explanation, Demonstration, Handout Education comprehension: Patient verbalized understanding and returned demonstration   HOME EXERCISE PROGRAM: Access Code: [email or web address] URL: [email or web address] Date: 09/14/2022 Prepared by: Candyce Churn  Exercises - Single Arm Bent Over Shoulder Extension with Dumbbell  - 1 x daily - 7 x weekly - 1 sets - 20 reps - Bent Over Single Arm Shoulder Row with Dumbbell  - 1 x daily - 7 x weekly - 1 sets - 20 reps - Single Arm Bent Over Shoulder Horizontal Abduction with Dumbbell - Palm Down  - 1 x daily - 7 x weekly - 1 sets - 20 reps - Sidelying Shoulder External Rotation  - 1 x daily - 7 x weekly - 1 sets - 20 reps - Supine Scapular Protraction in Flexion with Dumbbells  - 1 x daily - 7 x weekly - 1 sets - 20 reps - Single Arm Doorway Pec Stretch at 120 Degrees Abduction  - 1 x daily - 7 x weekly - 1 sets - 10 reps - 10 hold - Doorway Rhomboid Stretch  - 1 x daily - 7 x weekly - 1 sets - 10 reps - 10 holdPatient was instructed today in a home exercise program today for post op shoulder range of motion. These included active  assist shoulder flexion in sitting, scapular retraction, wall walking with shoulder abduction, and hands behind head external rotation.  She was encouraged to do these twice a day, holding 3 seconds and repeating 5 times when permitted by her physician. - Pt will start now due to shoulder status  Access Code: [email or web address] URL: [email or web address] Date: 09/07/2022 Prepared by: Candyce Churn  Exercises - Single Arm Bent Over Shoulder Extension with Dumbbell  - 1 x daily - 7 x weekly - 1 sets - 20 reps - Bent Over Single Arm Shoulder Row with Dumbbell   - 1 x daily - 7 x weekly - 1 sets - 20 reps - Single Arm Bent Over Shoulder Horizontal Abduction with Dumbbell - Palm Down  - 1 x daily - 7 x weekly - 1 sets - 20 reps - Sidelying Shoulder External Rotation  - 1 x daily - 7 x weekly - 1 sets - 20 reps - Supine Scapular Protraction in Flexion with Dumbbells  - 1 x daily - 7 x weekly - 1 sets - 20 reps  ASSESSMENT: CLINICAL IMPRESSION: Shariah is progressing appropriately.    She is compliant and well motivated.  ER and IR are improving.  She is able to reach to L3-L4 now.  She would benefit from continuing skilled PT until surgery date to prepare for ROM needs post op.    Initial eval: Pt is planning to have a left lumpectomy with SLNB and radiation at an unknown date.  She has a hx of 3 shoulder surgeries and the need for a RTSR sometime in the future.  Dr. Donne Hazel and pt would like to have her attempt some visits prior to surgery to minimize potential for side effects. She will benefit from a post op PT reassessment to determine needs and from L-Dex screens every 3 months for 2 years to detect subclinical lymphedema.  Pt will benefit from skilled therapeutic intervention to improve on the following deficits: Decreased knowledge of precautions, impaired UE functional use, pain, decreased ROM, postural dysfunction.   PT treatment/interventions: ADL/self-care home management, pt/family education, therapeutic exercise, manual therapy, re-evaluation  REHAB POTENTIAL: Good  CLINICAL DECISION MAKING: Stable/uncomplicated  EVALUATION COMPLEXITY: Low   GOALS: Goals reviewed with patient? YES  LONG TERM GOALS: (STG=LTG)    Name Target Date Goal status  1 Pt will be able to verbalize understanding of pertinent lymphedema risk reduction practices relevant to her dx specifically related to skin care.  Baseline:  No knowledge 09/01/2022 Achieved at eval  2 Pt will be able to return demo and/or verbalize understanding of the post op HEP related to  regaining shoulder ROM. Baseline:  No knowledge 09/01/2022 Achieved at eval  3 Pt will be able to verbalize understanding of the importance of attending the post op After Breast CA Class for further lymphedema risk reduction education and therapeutic exercise.  Baseline:  No knowledge 09/01/2022 Achieved at eval  4 Pt will demo she has regained full shoulder ROM and function post operatively compared to baselines.  Baseline: See objective measurements taken today. 10/12/22   5 Pt will improve Left shoulder AROM by at least 140 degrees to be able to tolerate radiation positioning 09/29/22       PLAN: PT FREQUENCY/DURATION: 2x per week up until surgery and then 1 post op visit    PLAN FOR NEXT SESSION: Lt shoulder PROM, AAROM activities, isometrics? To prepare for lumpectomy and radiation positioning. No modalities in the area.  Schedule out more visits until lumpectomy  ortho/cancer side both fine and then 3 weeks post lumpectomy with cancer/Kara.     Patient will follow up at outpatient cancer rehab 3-4 weeks following surgery.  If the patient requires physical therapy at that time, a specific plan will be dictated and sent to the referring physician for approval. The patient was educated today on appropriate basic range of motion exercises to begin post operatively and the importance of attending the After Breast Cancer class following surgery.  Patient was educated today on lymphedema risk reduction practices as it pertains to recommendations that will benefit the patient immediately following surgery.  She verbalized good understanding.    Physical Therapy Information for After Breast Cancer Surgery/Treatment:  Lymphedema is a swelling condition that you may be at risk for in your arm if you have lymph nodes removed from the armpit area.  After a sentinel node biopsy, the risk is approximately 5-9% and is higher after an axillary node dissection.  There is treatment available for this condition  and it is not life-threatening.  Contact your physician or physical therapist with concerns. You may begin the 4 shoulder/posture exercises (see additional sheet) when permitted by your physician (typically a week after surgery).  If you have drains, you may need to wait until those are removed before beginning range of motion exercises.  A general recommendation is to not lift your arms above shoulder height until drains are removed.  These exercises should be done to your tolerance and gently.  This is not a "no pain/no gain" type of recovery so listen to your body and stretch into the range of motion that you can tolerate, stopping if you have pain.  If you are having immediate reconstruction, ask your plastic surgeon about doing exercises as he or she may want you to wait. We encourage you to attend the free one time ABC (After Breast Cancer) class offered by [facility].  You will learn information related to lymphedema risk, prevention and treatment and additional exercises to regain mobility following surgery.  You can call [phone] for more information.  This is offered the 1st and 3rd Monday of each month.  You only attend the class one time. While undergoing any medical procedure or treatment, try to avoid blood pressure being taken or needle sticks from occurring on the arm on the side of cancer.   This recommendation begins after surgery and continues for the rest of your life.  This may help reduce your risk of getting lymphedema (swelling in your arm). An excellent resource for those seeking information on lymphedema is the National Lymphedema Network's web site. It can be accessed at [place].org If you notice swelling in your hand, arm or breast at any time following surgery (even if it is many years from now), please contact your doctor or physical therapist to discuss this.  Lymphedema can be treated at any time but it is easier for you if it is treated early  on.  If you feel like your shoulder motion is not returning to normal in a reasonable amount of time, please contact your surgeon or physical therapist.  [facility] [phone]. [address], [address], [place] [address]  ABC CLASS After Breast Cancer Class  After Breast Cancer Class is a specially designed exercise class to assist you in a safe recover after having breast cancer surgery.  In this class you will learn how to get back to full function whether your drains  were just removed or if you had surgery a month ago.  This one-time class is held the 1st and 3rd Monday of every month from 11:00 a.m. until 12:00 noon virtually.  This class is FREE and space is limited. For more information or to register for the next available class, call [phone].  Class Goals  Understand specific stretches to improve the flexibility of you chest and shoulder. Learn ways to safely strengthen your upper body and improve your posture. Understand the warning signs of infection and why you may be at risk for an arm infection. Learn about Lymphedema and prevention.  ** You do not attend this class until after surgery.  Drains must be removed to participate  Patient was instructed today in a home exercise program today for post op shoulder range of motion. These included active assist shoulder flexion in sitting, scapular retraction, wall walking with shoulder abduction, and hands behind head external rotation.  She was encouraged to do these twice a day, holding 3 seconds and repeating 5 times when permitted by her physician.    Anderson Malta B. Maximilian Tallo, PT 09/20/22 8:42 AM   [facility] [address] [place], [place] [address] Phone # [phone] Fax [phone]

## 2022-09-20 NOTE — Telephone Encounter (Signed)
I contacted Ms. Nasworthy to discuss the remainder of her genetic testing results. No pathogenic variants were identified in the 77 genes analyzed. Detailed clinic note to follow.   The test report has been scanned into EPIC and is located under the Molecular Pathology section of the Results Review tab.  A portion of the result report is included below for reference.     Faith Rogue, MS, [facility] Genetic Counselor [email or web address] Phone: [phone]

## 2022-09-21 ENCOUNTER — Other Ambulatory Visit: Payer: Self-pay

## 2022-09-21 ENCOUNTER — Encounter (HOSPITAL_BASED_OUTPATIENT_CLINIC_OR_DEPARTMENT_OTHER): Payer: Self-pay | Admitting: General Surgery

## 2022-09-21 NOTE — Progress Notes (Signed)
   09/21/22 1107  PAT Phone Screen  Do You Have Diabetes? No  Do You Have Hypertension? No  Have You Ever Been to the ER for Asthma? No  Have You Taken Oral Steroids in the Past 3 Months? No  Do you Take Phenteramine or any Other Diet Drugs? No  Recent  Lab Work, EKG, CXR? Yes  Where was this test performed? EKG 11/01/21 [facility]  Do you have a history of heart problems? Cardiologist  Have You Ever Had Tests on Your Heart? Yes  When was Test Performed? 12/24/18  Where? ([facility])  Echo EF 60-65%   OV with Dr Garen Lah 10/04/21. Pt had PFO repair as a 56 year old. No cardiac issues since. To f/u prn.  Any Recent Hospitalizations? No  Height '5\' 2"'$  (1.575 m)  Weight 75.3 kg  Pat Appointment Scheduled No  Reason for No Appointment Not Needed

## 2022-09-22 ENCOUNTER — Ambulatory Visit (INDEPENDENT_AMBULATORY_CARE_PROVIDER_SITE_OTHER): Payer: 59 | Admitting: Licensed Clinical Social Worker

## 2022-09-22 DIAGNOSIS — F4310 Post-traumatic stress disorder, unspecified: Secondary | ICD-10-CM

## 2022-09-22 NOTE — Progress Notes (Signed)
  THERAPIST PROGRESS NOTE  Session Time: 9-10a  [place] in office visit for patient and LCSW clinician  Participation Level: Active  Behavioral Response: Neat and Well GroomedAlertAnxious and Euthymic  Type of Therapy: Individual Therapy  Treatment Goals addressed: Problem: Reduce the negative impact trauma related symptoms have on social, occupational, and family functioning. Goal: LTG: Reduce frequency, intensity, and duration of PTSD symptoms so daily functioning is improved: Input needed on appropriate metric.  Pt self report. Outcome: Progressing Goal: STG: Practice interpersonal effectiveness skills 7 times per week for the next 16 weeks Outcome: Progressing Intervention: Encourage verbalization of feelings/concerns/expectations Note: Allowed pt to explore/express Intervention: Identify needs related to health status changes Note: Allowed pt to explore psychological impact of recent diagnosis   ProgressTowards Goals: Progressing  Interventions: CBT and Other: trauma focused  Summary: SERINE KEA is a 56 y.o. female who presents with continuing symptoms related to PTSD/anxiety.  Allowed pt to explore and express thoughts and feelings associated with recent life situations and external stressors. Discussed stress associated with concerns about her own health and the health of her husband. Patient reports that she recently receive the diagnosis of breast cancer. Allow patient to explore her thoughts and feelings associated with this diagnosis, and overall psychological impact. Allow patient safe space to explore her strong emotional response to her diagnosis. Discussed ongoing life balance--patient is trying to balance her doctor's appointments, treatment, and managing her own self-care. Patient also feels like she is the primary caregiver for her husband, so is constantly making appointments for him and assisting him with any medical concerns he  has. Patient is currently using FMLA days to help manage.  Pt reports that family members are very supportive--no issues or concerns. Pt reports some continuing work-related stress.  Reviewed emotion regulation, coping skills for depression and anxiety management.     Continued recommendations are as follows: self care behaviors, positive social engagements, focusing on overall work/home/life balance, and focusing on positive physical and emotional wellness.   Suicidal/Homicidal: No  Therapist Response: Pt is continuing to apply interventions learned in session into daily life situations. Pt is currently on track to meet goals utilizing interventions mentioned above. Personal growth and progress noted. Treatment to continue as indicated.   Plan: Return again in 4 weeks.  Diagnosis:  Encounter Diagnosis  Name Primary?   PTSD (post-traumatic stress disorder) Yes    Collaboration of Care: Other Pt encouraged to continue psychiatric care with Dr. Modesta Messing  Patient/Guardian was advised Release of Information must be obtained prior to any record release in order to collaborate their care with an outside provider. Patient/Guardian was advised if they have not already done so to contact the registration department to sign all necessary forms in order for [place] to release information regarding their care.   Consent: Patient/Guardian gives verbal consent for treatment and assignment of benefits for services provided during this visit. Patient/Guardian expressed understanding and agreed to proceed.   [place], LCSW 09/22/2022

## 2022-09-22 NOTE — Plan of Care (Signed)
  Problem: Reduce the negative impact trauma related symptoms have on social, occupational, and family functioning. Goal: LTG: Reduce frequency, intensity, and duration of PTSD symptoms so daily functioning is improved: Input needed on appropriate metric.  Pt self report. Outcome: Progressing Goal: STG: Practice interpersonal effectiveness skills 7 times per week for the next 16 weeks Outcome: Progressing Intervention: Encourage verbalization of feelings/concerns/expectations Note: Allowed pt to explore/express Intervention: Identify needs related to health status changes Note: Allowed pt to explore psychological impact of recent diagnosis

## 2022-09-27 ENCOUNTER — Ambulatory Visit: Payer: 59

## 2022-09-28 ENCOUNTER — Inpatient Hospital Stay: Payer: 59 | Attending: Oncology | Admitting: Hospice and Palliative Medicine

## 2022-09-28 ENCOUNTER — Ambulatory Visit
Admission: RE | Admit: 2022-09-28 | Discharge: 2022-09-28 | Disposition: A | Discharge status: Home / Self Care | Payer: 59 | Source: Ambulatory Visit | Attending: General Surgery | Admitting: General Surgery

## 2022-09-28 DIAGNOSIS — C50412 Malignant neoplasm of upper-outer quadrant of left female breast: Secondary | ICD-10-CM

## 2022-09-28 DIAGNOSIS — Z800 Family history of malignant neoplasm of digestive organs: Secondary | ICD-10-CM | POA: Insufficient documentation

## 2022-09-28 DIAGNOSIS — Z170 Estrogen receptor positive status [ER+]: Secondary | ICD-10-CM | POA: Insufficient documentation

## 2022-09-28 DIAGNOSIS — R928 Other abnormal and inconclusive findings on diagnostic imaging of breast: Secondary | ICD-10-CM | POA: Diagnosis not present

## 2022-09-28 DIAGNOSIS — Z90710 Acquired absence of both cervix and uterus: Secondary | ICD-10-CM | POA: Insufficient documentation

## 2022-09-28 DIAGNOSIS — Z803 Family history of malignant neoplasm of breast: Secondary | ICD-10-CM | POA: Insufficient documentation

## 2022-09-28 NOTE — Progress Notes (Signed)

## 2022-09-28 NOTE — Progress Notes (Signed)
Multidisciplinary Oncology Council Documentation  Nichole Brown was presented by our [place] on 09/28/2022, which included representatives from:  Palliative Care Dietitian  Physical/Occupational Therapist Nurse Navigator Genetics Speech Therapist Social work Survivorship RN Financial Navigator Research RN   Nichole Brown currently presents with history of lung cancer  We reviewed previous medical and familial history, history of present illness, and recent lab results along with all available histopathologic and imaging studies. The [place] considered available treatment options and made the following recommendations/referrals:  None currently  The MOC is a meeting of clinicians from various specialty areas who evaluate and discuss patients for whom a multidisciplinary approach is being considered. Final determinations in the plan of care are those of the provider(s).   Today's extended care, comprehensive team conference, Nichole Brown was not present for the discussion and was not examined.

## 2022-09-29 ENCOUNTER — Ambulatory Visit
Admission: RE | Admit: 2022-09-29 | Discharge: 2022-09-29 | Disposition: A | Discharge status: Home / Self Care | Payer: 59 | Source: Ambulatory Visit | Attending: General Surgery | Admitting: General Surgery

## 2022-09-29 ENCOUNTER — Other Ambulatory Visit: Payer: Self-pay

## 2022-09-29 ENCOUNTER — Ambulatory Visit (HOSPITAL_BASED_OUTPATIENT_CLINIC_OR_DEPARTMENT_OTHER): Payer: 59 | Admitting: Anesthesiology

## 2022-09-29 ENCOUNTER — Encounter (HOSPITAL_BASED_OUTPATIENT_CLINIC_OR_DEPARTMENT_OTHER): Payer: Self-pay | Admitting: General Surgery

## 2022-09-29 ENCOUNTER — Ambulatory Visit (HOSPITAL_BASED_OUTPATIENT_CLINIC_OR_DEPARTMENT_OTHER)
Admission: RE | Admit: 2022-09-29 | Discharge: 2022-09-29 | Disposition: A | Discharge status: Home / Self Care | Payer: 59 | Attending: General Surgery | Admitting: General Surgery

## 2022-09-29 ENCOUNTER — Encounter (HOSPITAL_BASED_OUTPATIENT_CLINIC_OR_DEPARTMENT_OTHER)
Admission: RE | Disposition: A | Discharge status: Home / Self Care | Payer: Self-pay | Source: Home / Self Care | Attending: General Surgery

## 2022-09-29 DIAGNOSIS — Z170 Estrogen receptor positive status [ER+]: Secondary | ICD-10-CM

## 2022-09-29 DIAGNOSIS — C50412 Malignant neoplasm of upper-outer quadrant of left female breast: Secondary | ICD-10-CM

## 2022-09-29 DIAGNOSIS — Z87891 Personal history of nicotine dependence: Secondary | ICD-10-CM | POA: Diagnosis not present

## 2022-09-29 DIAGNOSIS — N6489 Other specified disorders of breast: Secondary | ICD-10-CM | POA: Diagnosis not present

## 2022-09-29 DIAGNOSIS — N62 Hypertrophy of breast: Secondary | ICD-10-CM | POA: Diagnosis not present

## 2022-09-29 DIAGNOSIS — R928 Other abnormal and inconclusive findings on diagnostic imaging of breast: Secondary | ICD-10-CM | POA: Diagnosis not present

## 2022-09-29 DIAGNOSIS — G8918 Other acute postprocedural pain: Secondary | ICD-10-CM | POA: Diagnosis not present

## 2022-09-29 DIAGNOSIS — D0512 Intraductal carcinoma in situ of left breast: Secondary | ICD-10-CM | POA: Diagnosis not present

## 2022-09-29 DIAGNOSIS — C50912 Malignant neoplasm of unspecified site of left female breast: Secondary | ICD-10-CM | POA: Diagnosis not present

## 2022-09-29 HISTORY — PX: BREAST LUMPECTOMY WITH RADIOACTIVE SEED AND SENTINEL LYMPH NODE BIOPSY: SHX6550

## 2022-09-29 SURGERY — BREAST LUMPECTOMY WITH RADIOACTIVE SEED AND SENTINEL LYMPH NODE BIOPSY
Anesthesia: General | Site: Breast | Laterality: Left

## 2022-09-29 MED ORDER — AMISULPRIDE (ANTIEMETIC) 5 MG/2ML IV SOLN
INTRAVENOUS | Status: AC
  Filled 2022-09-29: qty 4

## 2022-09-29 MED ORDER — FENTANYL CITRATE (PF) 100 MCG/2ML IJ SOLN
INTRAMUSCULAR | Status: AC
  Filled 2022-09-29: qty 2

## 2022-09-29 MED ORDER — PROPOFOL 500 MG/50ML IV EMUL
INTRAVENOUS | Status: DC | PRN
  Administered 2022-09-29: 150 ug/kg/min via INTRAVENOUS

## 2022-09-29 MED ORDER — SCOPOLAMINE 1 MG/3DAYS TD PT72
MEDICATED_PATCH | TRANSDERMAL | Status: AC
  Filled 2022-09-29: qty 1

## 2022-09-29 MED ORDER — BUPIVACAINE HCL (PF) 0.25 % IJ SOLN
INTRAMUSCULAR | Status: DC | PRN
  Administered 2022-09-29: 2 mL

## 2022-09-29 MED ORDER — ACETAMINOPHEN 500 MG PO TABS
1000.0000 mg | ORAL_TABLET | ORAL | Status: AC
  Administered 2022-09-29: 1000 mg via ORAL

## 2022-09-29 MED ORDER — CELECOXIB 200 MG PO CAPS
ORAL_CAPSULE | ORAL | Status: AC
  Filled 2022-09-29: qty 2

## 2022-09-29 MED ORDER — ENSURE PRE-SURGERY PO LIQD: 296.0000 mL | Freq: Once | ORAL | Status: DC

## 2022-09-29 MED ORDER — CELECOXIB 200 MG PO CAPS
400.0000 mg | ORAL_CAPSULE | ORAL | Status: AC
  Administered 2022-09-29: 200 mg via ORAL

## 2022-09-29 MED ORDER — DEXAMETHASONE SODIUM PHOSPHATE 10 MG/ML IJ SOLN
INTRAMUSCULAR | Status: AC
  Filled 2022-09-29: qty 1

## 2022-09-29 MED ORDER — FENTANYL CITRATE (PF) 100 MCG/2ML IJ SOLN
25.0000 ug | INTRAMUSCULAR | Status: DC | PRN
  Administered 2022-09-29: 25 ug via INTRAVENOUS

## 2022-09-29 MED ORDER — PROMETHAZINE HCL 25 MG/ML IJ SOLN
INTRAMUSCULAR | Status: AC
  Filled 2022-09-29: qty 1

## 2022-09-29 MED ORDER — PROPOFOL 10 MG/ML IV BOLUS
INTRAVENOUS | Status: AC
  Filled 2022-09-29: qty 20

## 2022-09-29 MED ORDER — BUPIVACAINE LIPOSOME 1.3 % IJ SUSP
INTRAMUSCULAR | Status: DC | PRN
  Administered 2022-09-29: 10 mL

## 2022-09-29 MED ORDER — MIDAZOLAM HCL 2 MG/2ML IJ SOLN
2.0000 mg | Freq: Once | INTRAMUSCULAR | Status: AC
  Administered 2022-09-29: 2 mg via INTRAVENOUS

## 2022-09-29 MED ORDER — CEFAZOLIN SODIUM-DEXTROSE 2-4 GM/100ML-% IV SOLN
2.0000 g | INTRAVENOUS | Status: AC
  Administered 2022-09-29: 2 g via INTRAVENOUS

## 2022-09-29 MED ORDER — BUPIVACAINE HCL (PF) 0.5 % IJ SOLN
INTRAMUSCULAR | Status: DC | PRN
  Administered 2022-09-29: 15 mL

## 2022-09-29 MED ORDER — CHLORHEXIDINE GLUCONATE CLOTH 2 % EX PADS: 6.0000 | MEDICATED_PAD | Freq: Once | CUTANEOUS | Status: DC

## 2022-09-29 MED ORDER — FENTANYL CITRATE (PF) 100 MCG/2ML IJ SOLN
INTRAMUSCULAR | Status: DC | PRN
  Administered 2022-09-29 (×2): 25 ug via INTRAVENOUS

## 2022-09-29 MED ORDER — CEFAZOLIN SODIUM-DEXTROSE 2-4 GM/100ML-% IV SOLN
INTRAVENOUS | Status: AC
  Filled 2022-09-29: qty 100

## 2022-09-29 MED ORDER — MAGTRACE LYMPHATIC TRACER
INTRAMUSCULAR | Status: DC | PRN
  Administered 2022-09-29: 2 mL via INTRAMUSCULAR

## 2022-09-29 MED ORDER — ONDANSETRON HCL 4 MG/2ML IJ SOLN
INTRAMUSCULAR | Status: AC
  Filled 2022-09-29: qty 2

## 2022-09-29 MED ORDER — ONDANSETRON HCL 4 MG/2ML IJ SOLN
INTRAMUSCULAR | Status: DC | PRN
  Administered 2022-09-29: 4 mg via INTRAVENOUS

## 2022-09-29 MED ORDER — LACTATED RINGERS IV SOLN: INTRAVENOUS | Status: DC

## 2022-09-29 MED ORDER — FENTANYL CITRATE (PF) 100 MCG/2ML IJ SOLN
100.0000 ug | Freq: Once | INTRAMUSCULAR | Status: AC
  Administered 2022-09-29: 100 ug via INTRAVENOUS

## 2022-09-29 MED ORDER — PROMETHAZINE HCL 25 MG/ML IJ SOLN
6.2500 mg | INTRAMUSCULAR | Status: DC | PRN
  Administered 2022-09-29: 6.25 mg via INTRAVENOUS

## 2022-09-29 MED ORDER — LIDOCAINE 2% (20 MG/ML) 5 ML SYRINGE
INTRAMUSCULAR | Status: AC
  Filled 2022-09-29: qty 5

## 2022-09-29 MED ORDER — SCOPOLAMINE 1 MG/3DAYS TD PT72
1.0000 | MEDICATED_PATCH | TRANSDERMAL | Status: DC
  Administered 2022-09-29: 1.5 mg via TRANSDERMAL

## 2022-09-29 MED ORDER — MIDAZOLAM HCL 2 MG/2ML IJ SOLN
INTRAMUSCULAR | Status: AC
  Filled 2022-09-29: qty 2

## 2022-09-29 MED ORDER — AMISULPRIDE (ANTIEMETIC) 5 MG/2ML IV SOLN
10.0000 mg | Freq: Once | INTRAVENOUS | Status: AC | PRN
  Administered 2022-09-29: 10 mg via INTRAVENOUS

## 2022-09-29 MED ORDER — DEXAMETHASONE SODIUM PHOSPHATE 10 MG/ML IJ SOLN
INTRAMUSCULAR | Status: AC
  Filled 2022-09-29: qty 2

## 2022-09-29 MED ORDER — DEXAMETHASONE SODIUM PHOSPHATE 4 MG/ML IJ SOLN
INTRAMUSCULAR | Status: DC | PRN
  Administered 2022-09-29: 10 mg via INTRAVENOUS

## 2022-09-29 MED ORDER — PROPOFOL 10 MG/ML IV BOLUS
INTRAVENOUS | Status: DC | PRN
  Administered 2022-09-29: 150 mg via INTRAVENOUS

## 2022-09-29 MED ORDER — TRAMADOL HCL 50 MG PO TABS: 50.0000 mg | ORAL_TABLET | Freq: Four times a day (QID) | ORAL | 0 refills | Status: DC | PRN

## 2022-09-29 MED ORDER — ACETAMINOPHEN 500 MG PO TABS
ORAL_TABLET | ORAL | Status: AC
  Filled 2022-09-29: qty 2

## 2022-09-29 SURGICAL SUPPLY — 51 items
ADH SKN CLS APL DERMABOND .7 (GAUZE/BANDAGES/DRESSINGS) ×1
APL PRP STRL LF DISP 70% ISPRP (MISCELLANEOUS) ×1
APPLIER CLIP 9.375 MED OPEN (MISCELLANEOUS)
APR CLP MED 9.3 20 MLT OPN (MISCELLANEOUS)
BINDER BREAST LRG (GAUZE/BANDAGES/DRESSINGS) IMPLANT
BINDER BREAST XLRG (GAUZE/BANDAGES/DRESSINGS) IMPLANT
BLADE SURG 15 STRL LF DISP TIS (BLADE) ×2 IMPLANT
BLADE SURG 15 STRL SS (BLADE) ×1
CANISTER SUC SOCK COL 7IN (MISCELLANEOUS) IMPLANT
CANISTER SUCT 1200ML W/VALVE (MISCELLANEOUS) IMPLANT
CHLORAPREP W/TINT 26 (MISCELLANEOUS) ×2 IMPLANT
CLIP APPLIE 9.375 MED OPEN (MISCELLANEOUS) IMPLANT
CLIP TI WIDE RED SMALL 6 (CLIP) ×2 IMPLANT
COVER BACK TABLE 60X90IN (DRAPES) ×2 IMPLANT
COVER MAYO STAND STRL (DRAPES) ×2 IMPLANT
COVER PROBE W GEL 5X96 (DRAPES) ×2 IMPLANT
DERMABOND ADVANCED .7 DNX12 (GAUZE/BANDAGES/DRESSINGS) ×2 IMPLANT
DRAPE LAPAROSCOPIC ABDOMINAL (DRAPES) ×2 IMPLANT
DRAPE UTILITY XL STRL (DRAPES) ×2 IMPLANT
ELECT COATED BLADE 2.86 ST (ELECTRODE) ×2 IMPLANT
ELECT REM PT RETURN 9FT ADLT (ELECTROSURGICAL) ×1
ELECTRODE REM PT RTRN 9FT ADLT (ELECTROSURGICAL) ×2 IMPLANT
GLOVE BIO SURGEON STRL SZ 6.5 (GLOVE) IMPLANT
GLOVE BIO SURGEON STRL SZ7 (GLOVE) ×4 IMPLANT
GLOVE BIOGEL PI IND STRL 7.0 (GLOVE) IMPLANT
GLOVE BIOGEL PI IND STRL 7.5 (GLOVE) ×2 IMPLANT
GOWN STRL REUS W/ TWL LRG LVL3 (GOWN DISPOSABLE) ×4 IMPLANT
GOWN STRL REUS W/TWL LRG LVL3 (GOWN DISPOSABLE) ×3
HEMOSTAT ARISTA ABSORB 3G PWDR (HEMOSTASIS) IMPLANT
KIT MARKER MARGIN INK (KITS) ×2 IMPLANT
NDL HYPO 25X1 1.5 SAFETY (NEEDLE) ×2 IMPLANT
NEEDLE HYPO 25X1 1.5 SAFETY (NEEDLE) ×1 IMPLANT
NS IRRIG 1000ML POUR BTL (IV SOLUTION) IMPLANT
PACK BASIN DAY SURGERY FS (CUSTOM PROCEDURE TRAY) ×2 IMPLANT
PENCIL SMOKE EVACUATOR (MISCELLANEOUS) ×2 IMPLANT
RETRACTOR ONETRAX LX 90X20 (MISCELLANEOUS) IMPLANT
SLEEVE SCD COMPRESS KNEE MED (STOCKING) ×2 IMPLANT
SPONGE T-LAP 4X18 ~~LOC~~+RFID (SPONGE) ×2 IMPLANT
STRIP CLOSURE SKIN 1/2X4 (GAUZE/BANDAGES/DRESSINGS) ×2 IMPLANT
SUT MNCRL AB 4-0 PS2 18 (SUTURE) ×2 IMPLANT
SUT SILK 2 0 SH (SUTURE) IMPLANT
SUT VIC AB 2-0 SH 27 (SUTURE) ×2
SUT VIC AB 2-0 SH 27XBRD (SUTURE) ×2 IMPLANT
SUT VIC AB 3-0 SH 27 (SUTURE) ×2
SUT VIC AB 3-0 SH 27X BRD (SUTURE) ×2 IMPLANT
SYR CONTROL 10ML LL (SYRINGE) ×2 IMPLANT
TOWEL GREEN STERILE FF (TOWEL DISPOSABLE) ×2 IMPLANT
TRACER MAGTRACE VIAL (MISCELLANEOUS) IMPLANT
TRAY FAXITRON CT DISP (TRAY / TRAY PROCEDURE) ×2 IMPLANT
TUBE CONNECTING 20X1/4 (TUBING) IMPLANT
YANKAUER SUCT BULB TIP NO VENT (SUCTIONS) IMPLANT

## 2022-09-29 NOTE — Progress Notes (Signed)
Assisted Dr. Singer with left, pectoralis, ultrasound guided block. Side rails up, monitors on throughout procedure. See vital signs in flow sheet. Tolerated Procedure well. 

## 2022-09-29 NOTE — Anesthesia Procedure Notes (Signed)
Procedure Name: LMA Insertion Date/Time: 09/29/2022 10:50 AM  Performed by: Ezequiel Kayser, CRNAPre-anesthesia Checklist: Patient identified, Emergency Drugs available, Suction available and Patient being monitored Patient Re-evaluated:Patient Re-evaluated prior to induction Oxygen Delivery Method: Circle System Utilized Preoxygenation: Pre-oxygenation with 100% oxygen Induction Type: IV induction Ventilation: Mask ventilation without difficulty LMA: LMA inserted LMA Size: 4.0 Number of attempts: 1 Airway Equipment and Method: Bite block Placement Confirmation: positive ETCO2 Tube secured with: Tape Dental Injury: Teeth and Oropharynx as per pre-operative assessment

## 2022-09-29 NOTE — Interval H&P Note (Signed)
History and Physical Interval Note:  09/29/2022 10:19 AM  Nichole Brown  has presented today for surgery, with the diagnosis of LEFT BREAST CANCER.  The various methods of treatment have been discussed with the patient and family. After consideration of risks, benefits and other options for treatment, the patient has consented to  Procedure(s): LEFT BREAST LUMPECTOMY WITH RADIOACTIVE SEED AND AXILLARY SENTINEL LYMPH NODE BIOPSY (Left) as a surgical intervention.  The patient's history has been reviewed, patient examined, no change in status, stable for surgery.  I have reviewed the patient's chart and labs.  Questions were answered to the patient's satisfaction.     Rolm Bookbinder

## 2022-09-29 NOTE — Op Note (Signed)
Preoperative diagnosis: Clinical stage I left breast cancer Postoperative diagnosis: Same as above Procedure: 1.  Left breast radioactive seed guided lumpectomy 2.  Left deep axillary sentinel lymph node biopsy 3.  Injection of mag trace for sentinel lymph node identification Surgeon: Dr. Serita Grammes Anesthesia: General with a pectoral block Estimated blood loss: Minimal Complications: None Drains: None Specimens: 1.  Left breast tissue containing seed and clip marked with paint 2.  Additional lateral and superior left breast margins marked short stitch superior, long stitch lateral, double stitch deep #3 left deep axillary sentinel lymph nodes with highest count of 2792 Sponge count was correct at completion Disposition recovery stable condition   Indications:56 year old female who has no prior breast surgery. She has C density breast tissue on mammography. She had a couple abnormalities on the screening that returned for diagnostic views and has an asymmetry on the left upper breast. There is an oval mass noted that on ultrasound measures 5 x 4 x 3 mm. Her axillary ultrasound is negative. 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 We discussed lumpectomy and sn biopsy.    Procedure: After informed consent was obtained I first injected 2 cc of mag trace in the subareolar position.  She underwent a pectoral block.  Antibiotics were given.  SCDs were in place.  She was then placed under general anesthesia without complication.  She was prepped and draped in the standard sterile surgical fashion.  A surgical timeout was then performed.   I made a curvilinear incisoin overyling the tumor. I then dissected to the seeds. The radioactive seed and the surrounding tissue was then excised with an attempt to get a clear margin.  I marked the cavity with clips. Mammogram confirmed removal of the seeds and the clips.  I did  remove the additional margins as I thought I was close on the CT scan. Hemostasis was obtained.  I closed down the cavity to obliterate it with 2-0 Vicryl suture.  Then closed the incision with 3-0 Vicryl and 4-0 Monocryl. Glue and steristrips applied.      I made a low axillary incision. I then entered into the axilla.  There were a couple of lymph nodes that were active with mag trace. I excised these and passed these off the table.  there was no additional activity and no palpable nodes.  Hemostasis was obtained.  I closed the axillary fascia with 2-0 Vicryl. I placed clips in the cavity and closed the breast tissue.  The skin was closed with 3-0 Vicryl and 4-0 Monocryl.  Glue was placed.  She tolerated this well and was extubated transferred to recovery stable

## 2022-09-29 NOTE — Anesthesia Procedure Notes (Signed)
Anesthesia Regional Block: Pectoralis block   Pre-Anesthetic Checklist: , timeout performed,  Correct Patient, Correct Site, Correct Laterality,  Correct Procedure, Correct Position, site marked,  Risks and benefits discussed,  Surgical consent,  Pre-op evaluation,  At surgeon's request and post-op pain management  Laterality: Left  Prep: chloraprep       Needles:  Injection technique: Single-shot  Needle Type: Echogenic Stimulator Needle     Needle Length: 10cm  Needle Gauge: 21     Additional Needles:   Procedures:,,,, ultrasound used (permanent image in chart),,    Narrative:  Start time: 09/29/2022 9:44 AM End time: 09/29/2022 9:54 AM Injection made incrementally with aspirations every 5 mL.  Performed by: Personally

## 2022-09-29 NOTE — Anesthesia Preprocedure Evaluation (Addendum)
Anesthesia Evaluation  Patient identified by MRN, date of birth, ID band Patient awake    Reviewed: Allergy & Precautions, NPO status , Patient's Chart, lab work & pertinent test results  History of Anesthesia Complications (+) PONV and history of anesthetic complications  Airway Mallampati: II  TM Distance: >3 FB Neck ROM: Full    Dental no notable dental hx. (+) Dental Advisory Given   Pulmonary neg pulmonary ROS, former smoker,    Pulmonary exam normal        Cardiovascular negative cardio ROS Normal cardiovascular exam  Normal echo 2020   Neuro/Psych PSYCHIATRIC DISORDERS Anxiety Depression negative neurological ROS     GI/Hepatic Neg liver ROS, GERD  ,  Endo/Other  negative endocrine ROS  Renal/GU negative Renal ROS     Musculoskeletal  (+) Fibromyalgia -  Abdominal   Peds  Hematology negative hematology ROS (+)   Anesthesia Other Findings   Reproductive/Obstetrics                            Anesthesia Physical Anesthesia Plan  ASA: 2  Anesthesia Plan: General   Post-op Pain Management: Regional block*, Celebrex PO (pre-op)* and Tylenol PO (pre-op)*   Induction: Intravenous  PONV Risk Score and Plan: 4 or greater and Ondansetron, Dexamethasone, Midazolam and Scopolamine patch - Pre-op  Airway Management Planned: LMA  Additional Equipment:   Intra-op Plan:   Post-operative Plan: Extubation in OR  Informed Consent: I have reviewed the patients History and Physical, chart, labs and discussed the procedure including the risks, benefits and alternatives for the proposed anesthesia with the patient or authorized representative who has indicated his/her understanding and acceptance.     Dental advisory given  Plan Discussed with: Anesthesiologist and CRNA  Anesthesia Plan Comments:        Anesthesia Quick Evaluation

## 2022-09-29 NOTE — Discharge Instructions (Addendum)
[facility] Phone Number [phone]  POST OP INSTRUCTIONS Take 400 mg of ibuprofen every 8 hours or 650 mg tylenol every 6 hours for next 72 hours then as needed. Use ice several times daily also.  A prescription for pain medication may be given to you upon discharge.  Take your pain medication as prescribed, if needed.  If narcotic pain medicine is not needed, then you may take acetaminophen (Tylenol), naprosyn (Alleve) or ibuprofen (Advil) as needed. Take your usually prescribed medications unless otherwise directed If you need a refill on your pain medication, please contact your pharmacy.  They will contact our office to request authorization.  Prescriptions will not be filled after 5pm or on week-ends. You should eat very light the first 24 hours after surgery, such as soup, crackers, pudding, etc.  Resume your normal diet the day after surgery. Most patients will experience some swelling and bruising in the breast.  Ice packs and a good support bra will help.  Wear the breast binder provided or a sports bra for 72 hours day and night.  After that wear a sports bra during the day until you return to the office. Swelling and bruising can take several days to resolve.  It is common to experience some constipation if taking pain medication after surgery.  Increasing fluid intake and taking a stool softener will usually help or prevent this problem from occurring.  A mild laxative (Milk of Magnesia or Miralax) should be taken according to package directions if there are no bowel movements after 48 hours. I used skin glue on the incision, you may shower in 24 hours.  The glue will flake off over the next 2-3 weeks.  Any sutures or staples will be removed at the office during your follow-up visit. ACTIVITIES:  You may resume regular daily activities (gradually increasing) beginning the next day.  Wearing a good support bra or sports bra minimizes pain and swelling.  You may have  sexual intercourse when it is comfortable. You may drive when you no longer are taking prescription pain medication, you can comfortably wear a seatbelt, and you can safely maneuver your car and apply brakes. RETURN TO WORK:  ______________________________________________________________________________________ Dennis Bast should see your doctor in the office for a follow-up appointment approximately two weeks after your surgery.  Your doctor's nurse will typically make your follow-up appointment when she calls you with your pathology report.  Expect your pathology report 3-4 business days after your surgery.  You may call to check if you do not hear from [place] after three days. OTHER INSTRUCTIONS: _______________________________________________________________________________________________ _____________________________________________________________________________________________________________________________________ _____________________________________________________________________________________________________________________________________ _____________________________________________________________________________________________________________________________________  WHEN TO CALL DR WAKEFIELD: Fever over 101.0 Nausea and/or vomiting. Extreme swelling or bruising. Continued bleeding from incision. Increased pain, redness, or drainage from the incision.  The clinic staff is available to answer your questions during regular business hours.  Please don't hesitate to call and ask to speak to one of the nurses for clinical concerns.  If you have a medical emergency, go to the nearest emergency room or call 911.  A surgeon from [facility] is always on call at the hospital.  For further questions, please visit [email or web address]  Post Anesthesia Home Care Instructions  Activity: Get plenty of rest for the remainder of the day. A responsible individual must stay with  you for 24 hours following the procedure.  For the next 24 hours, DO NOT: -Drive a car -Paediatric nurse -Drink alcoholic beverages -Take any medication unless instructed by your  physician -Make any legal decisions or sign important papers.  Meals: Start with liquid foods such as gelatin or soup. Progress to regular foods as tolerated. Avoid greasy, spicy, heavy foods. If nausea and/or vomiting occur, drink only clear liquids until the nausea and/or vomiting subsides. Call your physician if vomiting continues.  Special Instructions/Symptoms: Your throat may feel dry or sore from the anesthesia or the breathing tube placed in your throat during surgery. If this causes discomfort, gargle with warm salt water. The discomfort should disappear within 24 hours.  If you had a scopolamine patch placed behind your ear for the management of post- operative nausea and/or vomiting:  1. The medication in the patch is effective for 72 hours, after which it should be removed.  Wrap patch in a tissue and discard in the trash. Wash hands thoroughly with soap and water. 2. You may remove the patch earlier than 72 hours if you experience unpleasant side effects which may include dry mouth, dizziness or visual disturbances. 3. Avoid touching the patch. Wash your hands with soap and water after contact with the patch.  Information for Discharge Teaching: EXPAREL (bupivacaine liposome injectable suspension)   Your surgeon or anesthesiologist gave you EXPAREL(bupivacaine) to help control your pain after surgery.  EXPAREL is a local anesthetic that provides pain relief by numbing the tissue around the surgical site. EXPAREL is designed to release pain medication over time and can control pain for up to 72 hours. Depending on how you respond to EXPAREL, you may require less pain medication during your recovery.  Possible side effects: Temporary loss of sensation or ability to move in the area where bupivacaine  was injected. Nausea, vomiting, constipation Rarely, numbness and tingling in your mouth or lips, lightheadedness, or anxiety may occur. Call your doctor right away if you think you may be experiencing any of these sensations, or if you have other questions regarding possible side effects.  Follow all other discharge instructions given to you by your surgeon or nurse. Eat a healthy diet and drink plenty of water or other fluids.  If you return to the hospital for any reason within 96 hours following the administration of EXPAREL, it is important for health care providers to know that you have received this anesthetic. A teal colored band has been placed on your arm with the date, time and amount of EXPAREL you have received in order to alert and inform your health care providers. Please leave this armband in place for the full 96 hours following administration, and then you may remove the band.   *May have Tylenol or Ibuprofen after 3:20pm this afternoon 09/29/22

## 2022-09-29 NOTE — Transfer of Care (Signed)
Immediate Anesthesia Transfer of Care Note  Patient: Nichole Brown  Procedure(s) Performed: LEFT BREAST LUMPECTOMY WITH RADIOACTIVE SEED AND AXILLARY SENTINEL LYMPH NODE BIOPSY (Left: Breast)  Patient Location: PACU  Anesthesia Type:General and Regional  Level of Consciousness: drowsy  Airway & Oxygen Therapy: Patient Spontanous Breathing and Patient connected to face mask oxygen  Post-op Assessment: Report given to RN and Post -op Vital signs reviewed and stable  Post vital signs: Reviewed and stable  Last Vitals:  Vitals Value Taken Time  BP 114/66 09/29/22 1155  Temp    Pulse 60 09/29/22 1157  Resp 12 09/29/22 1157  SpO2 100 % 09/29/22 1157  Vitals shown include unvalidated device data.  Last Pain:  Vitals:   09/29/22 0917  TempSrc: Oral  PainSc: 0-No pain      Patients Stated Pain Goal: 5 (11/00/34 9611)  Complications: No notable events documented.

## 2022-09-29 NOTE — Anesthesia Postprocedure Evaluation (Signed)
Anesthesia Post Note  Patient: Nichole Brown  Procedure(s) Performed: LEFT BREAST LUMPECTOMY WITH RADIOACTIVE SEED AND AXILLARY SENTINEL LYMPH NODE BIOPSY (Left: Breast)     Patient location during evaluation: PACU Anesthesia Type: General Level of consciousness: sedated Pain management: pain level controlled Vital Signs Assessment: post-procedure vital signs reviewed and stable Respiratory status: spontaneous breathing and respiratory function stable Cardiovascular status: stable Postop Assessment: no apparent nausea or vomiting Anesthetic complications: no   No notable events documented.  Last Vitals:  Vitals:   09/29/22 1315 09/29/22 1327  BP: 132/69 128/78  Pulse: (!) 53 65  Resp: 10 18  Temp:  36.8 C  SpO2: 93% 97%    Last Pain:  Vitals:   09/29/22 1327  TempSrc: Oral  PainSc: 3                  Langley Flatley [name]

## 2022-09-29 NOTE — H&P (Signed)
56 year old female who has no prior breast surgery. She has a family history of breast cancer in her mother at age 58. I have taken care of her mother in the past. No one in the family has had genetic testing. She has had 3 left shoulder surgeries and has some limited range of motion. She works at [employer] as a Charity fundraiser. She is here with her daughter who is a Marine scientist who works in the [employer]. She has C density breast tissue on mammography. She had a couple abnormalities on the screening that returned for diagnostic views and has an asymmetry on the left upper breast. There is an oval mass noted that on ultrasound measures 5 x 4 x 3 mm. Her axillary ultrasound is negative. 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 Pathology at the outside institution does not further define this. She has no mass or discharge on her exam. She is here today to discuss options.  Review of Systems: A complete review of systems was obtained from the patient. I have reviewed this information and discussed as appropriate with the patient. See HPI as well for other ROS.  Review of Systems  Neurological: Positive for headaches.  All other systems reviewed and are negative.   Medical History: Past Medical History:  Diagnosis Date  Anxiety  Fibromyalgia 2005  GERD (gastroesophageal reflux disease)  History of cancer  Migraine headache   Patient Active Problem List  Diagnosis  Chronic migraine without aura without status migrainosus, not intractable  Rotator cuff tendinitis, left  Primary osteoarthritis of left shoulder  Adhesive capsulitis of left shoulder  Gastroesophageal reflux disease without esophagitis  Generalized anxiety disorder  Depression, major, single episode, moderate (CMS-HCC)  Grief at loss of child  Fibromyalgia  Sternal fracture  Insomnia  Taking multiple medications for chronic  disease  Rhinosinusitis  Non-recurrent acute suppurative otitis media of right ear without spontaneous rupture of tympanic membrane  Night muscle spasms  Migraine headache  Impingement syndrome of left shoulder  Dysphagia  Contusion of leg  Chest pain  Malignant neoplasm of upper-outer quadrant of left breast in female, estrogen receptor positive (CMS-HCC)   Past Surgical History:  Procedure Laterality Date  Extensive arthroscopic debridement and arthroscopic subacromial decompression, left shoulder. Left 10/03/2019  Dr.Poggi  CESAREAN SECTION  HYSTERECTOMY VAGINAL  LAPAROSCOPIC CHOLECYSTECTOMY  Left Shoulder Surgery  Open Heart Surgery  ORIF Surgery  PF Valve Closure    Allergies  Allergen Reactions  Duloxetine Hcl Other (See Comments)  Hallucinations; sleep walking  Nortriptyline Nausea And Vomiting  Promethazine Hcl Other (See Comments)  Restless legs and cramping  Topiramate Other (See Comments)  Dry eyes, visual changes; advised to stop med by her optometrist - update patient eye dr said OK to use   Current Outpatient Medications on File Prior to Visit  Medication Sig Dispense Refill  celecoxib (CELEBREX) 200 MG capsule Take 1 capsule (200 mg total) by mouth 2 (two) times daily 60 capsule 0  tiZANidine (ZANAFLEX) 4 MG tablet Take 1 tablet (4 mg total) by mouth 3 (three) times daily 60 tablet 1  acetaminophen (TYLENOL) 500 MG tablet Take 500 mg by mouth as needed for Pain  cholecalciferol (VITAMIN D3) 2,000 unit capsule Take by mouth once daily  ondansetron (ZOFRAN-ODT) 4 MG disintegrating tablet Take 1 tablet (4 mg total) by mouth every 8 (eight) hours as needed for Nausea migraine 20 tablet  11  predniSONE (DELTASONE) 10 MG tablet Take 6 tabs on days 1,2. Take 5 tabs on days 3,4. Take 4 tabs days 5,6. Take 3 tabs days 7,8. Take 2 tabs days 9,10. Take 1 tab days 11,12. 42 tablet 0  rizatriptan (MAXALT-MLT) 10 MG disintegrating tablet May take a second dose after 2 hours  if needed. 10 tablet 11  XIIDRA 5 % ophthalmic solution Place 1 drop into both eyes once daily   No current facility-administered medications on file prior to visit.   Family History  Problem Relation Age of Onset  Breast cancer Mother  Cancer Mother  Diabetes Father  High blood pressure (Hypertension) Father    Social History   Tobacco Use  Smoking Status Never  Smokeless Tobacco Never    Social History   Socioeconomic History  Marital status: Married  Tobacco Use  Smoking status: Never  Smokeless tobacco: Never  Vaping Use  Vaping Use: Never used  Substance and Sexual Activity  Alcohol use: No  Drug use: No   Objective:   Vitals:  09/01/22 1042  BP: 110/62  Pulse: 93  Weight: 75.2 kg (165 lb 12.8 oz)  Height: 157.5 cm ($RemoveB'5\' 2"'QtxAcSDF$ )   Body mass index is 30.33 kg/m.  Physical Exam Vitals reviewed.  Constitutional:  Appearance: Normal appearance.  Chest:  Breasts: Right: No inverted nipple, mass or nipple discharge.  Left: No inverted nipple, mass or nipple discharge.  Lymphadenopathy:  Upper Body:  Right upper body: No supraclavicular or axillary adenopathy.  Left upper body: No supraclavicular or axillary adenopathy.  Neurological:  Mental Status: She is alert.    Assessment and Plan:    Malignant neoplasm of upper-outer quadrant of left breast in female,   Left breast seed guided lumpectomy, left axillary sn biopsy  We discussed the staging and pathophysiology of breast cancer. We discussed all of the different options for treatment for breast cancer including surgery, chemotherapy, radiation therapy, Herceptin, and antiestrogen therapy  We discussed a sentinel lymph node biopsy as she does not appear to having lymph node involvement right now. We discussed the performance of that with injection of tracer. We discussed that there is a chance of having a positive node with a sentinel lymph node biopsy and we will await the permanent pathology to make  any other first further decisions in terms of her treatment. We discussed up to a 5% risk lifetime of chronic shoulder pain as well as lymphedema associated with a sentinel lymph node biopsy.  We discussed the options for treatment of the breast cancer which included lumpectomy versus a mastectomy. We discussed the performance of the lumpectomy with radioactive seed placement. We discussed a 5-10% chance of a positive margin requiring reexcision in the operating room. We also discussed that she will likely need radiation therapy if she undergoes lumpectomy. We discussed mastectomy and the postoperative care for that as well. Mastectomy can be followed by reconstruction. The decision for lumpectomy vs mastectomy has no impact on decision for chemotherapy. Most mastectomy patients will not need radiation therapy. We discussed that there is no difference in her survival whether she undergoes lumpectomy with radiation therapy or antiestrogen therapy versus a mastectomy. There is also no real difference between her recurrence in the breast  We discussed the risks of operation including bleeding, infection, possible reoperation. She understands her further therapy will be based on what her stages at the time of her operation.

## 2022-09-30 ENCOUNTER — Encounter (HOSPITAL_BASED_OUTPATIENT_CLINIC_OR_DEPARTMENT_OTHER): Payer: Self-pay | Admitting: General Surgery

## 2022-10-05 LAB — SURGICAL PATHOLOGY

## 2022-10-06 ENCOUNTER — Inpatient Hospital Stay: Payer: 59 | Admitting: Oncology

## 2022-10-06 ENCOUNTER — Telehealth: Payer: Self-pay | Admitting: *Deleted

## 2022-10-06 ENCOUNTER — Encounter: Payer: Self-pay | Admitting: Oncology

## 2022-10-06 VITALS — BP 106/76 | HR 85 | Temp 97.6°F | Resp 18 | Wt 163.6 lb

## 2022-10-06 DIAGNOSIS — C50412 Malignant neoplasm of upper-outer quadrant of left female breast: Secondary | ICD-10-CM | POA: Diagnosis not present

## 2022-10-06 DIAGNOSIS — Z7189 Other specified counseling: Secondary | ICD-10-CM

## 2022-10-06 DIAGNOSIS — Z90710 Acquired absence of both cervix and uterus: Secondary | ICD-10-CM | POA: Diagnosis not present

## 2022-10-06 DIAGNOSIS — Z170 Estrogen receptor positive status [ER+]: Secondary | ICD-10-CM | POA: Diagnosis not present

## 2022-10-06 DIAGNOSIS — Z803 Family history of malignant neoplasm of breast: Secondary | ICD-10-CM | POA: Diagnosis not present

## 2022-10-06 DIAGNOSIS — Z800 Family history of malignant neoplasm of digestive organs: Secondary | ICD-10-CM | POA: Diagnosis not present

## 2022-10-06 NOTE — Progress Notes (Signed)
Pt states she feels nauseous along with a severe headache this morning; still dealing with tenderness, soreness, and itchiness from lumpectomy. Pt will like to discuss if surgical area is suppose to feel hard.

## 2022-10-06 NOTE — Telephone Encounter (Signed)
Called patient with referral appointment time and date patient verbalized understanding.

## 2022-10-07 NOTE — Progress Notes (Signed)
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  This was followed by a diagnostic mammogram and ultrasound which showed multiple benign cysts noted at the 1 o'clock position.  At the 2 o'clock position 3 cm from the nipple was a oval hypoechoic mass measuring 5 x 4 x 3 mm.  No suspicious left axillary adenopathy.  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   Final pathology showed grade two 7 mm tumor with negative margins.  1 sentinel lymph node negative for malignancy.  Interval history-patient reports some soreness at the site of lumpectomy but denies other complaints at this time  ECOG PS- 0 Pain scale- 0   Review of systems- Review of Systems  Constitutional:  Negative for chills, fever, malaise/fatigue and weight loss.  HENT:  Negative for congestion, ear discharge and nosebleeds.   Eyes:  Negative for blurred vision.  Respiratory:  Negative for cough, hemoptysis, sputum production, shortness of breath and wheezing.   Cardiovascular:  Negative for chest pain, palpitations, orthopnea and claudication.  Gastrointestinal:  Negative for abdominal  pain, blood in stool, constipation, diarrhea, heartburn, melena, nausea and vomiting.  Genitourinary:  Negative for dysuria, flank pain, frequency, hematuria and urgency.  Musculoskeletal:  Negative for back pain, joint pain and myalgias.  Skin:  Negative for rash.  Neurological:  Negative for dizziness, tingling, focal weakness, seizures, weakness and headaches.  Endo/Heme/Allergies:  Does not bruise/bleed easily.  Psychiatric/Behavioral:  Negative for depression and suicidal ideas. The patient does not have insomnia.       Allergies  Allergen Reactions   Cymbalta [Duloxetine Hcl] Other (See Comments)    Hallucinations; sleep walking   Nortriptyline Nausea And Vomiting   Phenergan [Promethazine Hcl]     Restless legs and cramping     Past Medical History:  Diagnosis Date   Anxiety    Bronchitis    Complication of anesthesia    PONV   Depression    Fibromyalgia 2005   GERD (gastroesophageal reflux disease)    Insomnia    Migraine headache    Pneumonia    2000   PONV (postoperative nausea and vomiting)      Past Surgical History:  Procedure Laterality Date   ABDOMINAL HYSTERECTOMY     BALLOON DILATION N/A 12/14/2020   Procedure: BALLOON DILATION;  Surgeon: Eloise Harman, DO;  [facility];  Service: Endoscopy;  Laterality: N/A;   BIOPSY  12/14/2020   Procedure: BIOPSY;  Surgeon: Eloise Harman, DO;  [facility];  Service: Endoscopy;;   BREAST BIOPSY Left 08/24/2022   [place] bx, coil marker path pending   BREAST LUMPECTOMY WITH RADIOACTIVE SEED AND SENTINEL LYMPH NODE BIOPSY Left 09/29/2022   Procedure: LEFT BREAST LUMPECTOMY  WITH RADIOACTIVE SEED AND AXILLARY SENTINEL LYMPH NODE BIOPSY;  Surgeon: Rolm Bookbinder, MD;  Location: [place];  Service: General;  Laterality: Left;   CESAREAN SECTION     CHOLECYSTECTOMY  1988   COLONOSCOPY N/A 12/10/2018    six 4-8 mm polyps in proximal descending colon, mid transverse and  hepatic flexure. Diverticulosis. External and internal hemorrhoids. Torturous colon. Two simple adenomas, two serrated polyps, 2 hyperplastic polyps. Surveillance due Dec 2022.    ESOPHAGOGASTRODUODENOSCOPY (EGD) WITH PROPOFOL N/A 12/14/2020   Benign-appearing esophageal stenosis s/p dilation, gastritis, normal duodenum. Mild chronic gastritis.    open heart surgery  1967   PATENT DUCTUS ARTERIOUS REPAIR  1967   POLYPECTOMY  12/10/2018   Procedure: POLYPECTOMY;  Surgeon: Danie Binder, MD;  [facility];  Service: Endoscopy;;  hepatic flexure, descending, transverse   SHOULDER ARTHROSCOPY Left 06/16/2017   Procedure: LEFT SHOULDER ARTHROSCOPY, DEBRIDEMENT, AND DECOMPRESSION;  Surgeon: Newt Minion, MD;  Location: [place];  Service: Orthopedics;  Laterality: Left;   SHOULDER ARTHROSCOPY WITH SUBACROMIAL DECOMPRESSION Left 10/03/2019   Procedure: SHOULDER ARTHROSCOPY WITH DEBRIDEMENT, DECOMPRESSION;  Surgeon: Corky Mull, MD;  Location: [facility];  Service: Orthopedics;  Laterality: Left;   SHOULDER SURGERY Left 2008   [facility] joint    Social History   Socioeconomic History   Marital status: Married    Spouse name: daniel   Number of children: 4   Years of education: Not on file   Highest education level: Some college, no degree  Occupational History   Not on file  Tobacco Use   Smoking status: Never   Smokeless tobacco: Never  Vaping Use   Vaping Use: Never used  Substance and Sexual Activity   Alcohol use: Yes    Comment: occassionally   Drug use: No   Sexual activity: Yes    Birth control/protection: Surgical  Other Topics Concern   Not on file  Social History Narrative   Not on file   Social Determinants of Health   Financial Resource Strain: Low Risk  (07/12/2022)   Overall Financial Resource Strain (CARDIA)    Difficulty of Paying Living Expenses: Not hard at all  Food Insecurity: No Food Insecurity (07/12/2022)   Hunger Vital Sign    Worried About Running Out  of Food in the Last Year: Never true    [place] in the Last Year: Never true  Transportation Needs: No Transportation Needs (07/12/2022)   PRAPARE - Hydrologist (Medical): No    Lack of Transportation (Non-Medical): No  Physical Activity: Insufficiently Active (07/12/2022)   Exercise Vital Sign    Days of Exercise per Week: 3 days    Minutes of Exercise per Session: 30 min  Stress: Stress Concern Present (07/12/2022)   [place]    Feeling of Stress : To some extent  Social Connections: Moderately Integrated (07/12/2022)   Social Connection and Isolation Panel [NHANES]    Frequency of Communication with Friends and Family: Once a week    Frequency of Social Gatherings with Friends and Family: Once a week    Attends Religious Services: More than 4 times per year    Active Member of [employer] or Organizations: Yes    Attends Archivist Meetings: More than 4 times per year    Marital Status: Married  Human resources officer Violence: Not At Risk (07/12/2022)   Humiliation, Afraid, Rape, and Kick  questionnaire    Fear of Current or Ex-Partner: No    Emotionally Abused: No    Physically Abused: No    Sexually Abused: No    Family History  Problem Relation Age of Onset   Breast cancer Mother 38   Hypertension Father    Colon cancer Paternal Uncle        dx 76s   Hypertension Maternal Grandmother    Diabetes Maternal Grandmother    Heart disease Maternal Grandmother 40       MI   Hypertension Maternal Grandfather    Throat cancer Maternal Grandfather 50     Current Outpatient Medications:    celecoxib (CELEBREX) 200 MG capsule, Take 1 capsule (200 mg total) by mouth 2 (two) times daily, Disp: 60 capsule, Rfl: 0   Lifitegrast (XIIDRA) 5 % SOLN, Instill 1 drop into both eyes twice a day, Disp: 180 each, Rfl: 4   rizatriptan (MAXALT-MLT) 10 MG disintegrating tablet, Take 1 tablet (10 mg  total) by mouth once as needed (may repeat dose once in > 2 hrs) for up to 1 dose, Disp: 10 tablet, Rfl: 11   tiZANidine (ZANAFLEX) 4 MG tablet, Take 1 tablet (4 mg total) by mouth 3 (three) times daily, Disp: 60 tablet, Rfl: 1   traMADol (ULTRAM) 50 MG tablet, Take 1 tablet (50 mg total) by mouth every 6 (six) hours as needed., Disp: 10 tablet, Rfl: 0   XIIDRA 5 % SOLN, Place 1 drop into both eyes 2 (two) times daily., Disp: , Rfl:    ondansetron (ZOFRAN-ODT) 4 MG disintegrating tablet, Take 1 tablet (4 mg total) by mouth every 8 (eight) hours as needed for nausea or vomiting. (Patient not taking: Reported on 10/06/2022), Disp: 20 tablet, Rfl: 0  Physical exam:  Vitals:   10/06/22 1047  BP: 106/76  Pulse: 85  Resp: 18  Temp: 97.6 F (36.4 C)  SpO2: 97%  Weight: 163 lb 9.6 oz (74.2 kg)   Physical Exam Constitutional:      General: She is not in acute distress. Cardiovascular:     Rate and Rhythm: Normal rate and regular rhythm.     Heart sounds: Normal heart sounds.  Pulmonary:     Effort: Pulmonary effort is normal.  Skin:    General: Skin is warm and dry.  Neurological:     Mental Status: She is alert and oriented to person, place, and time.   Breast exam: Patient is s/p left lumpectomy with a surgical scar that appears to be well-healing.  There is bruising noted at the site of surgery.  No overt seroma noted.     Latest Ref Rng & Units 10/29/2021   10:30 AM  CMP  Glucose 70 - 99 mg/dL 127   BUN 6 - 20 mg/dL 14   Creatinine 0.44 - 1.00 mg/dL 0.62   Sodium 135 - 145 mmol/L 136   Potassium 3.5 - 5.1 mmol/L 3.7   Chloride 98 - 111 mmol/L 106   CO2 22 - 32 mmol/L 23   Calcium 8.9 - 10.3 mg/dL 9.2   Total Protein 6.5 - 8.1 g/dL 7.1   Total Bilirubin 0.3 - 1.2 mg/dL 0.5   Alkaline Phos 38 - 126 U/L 75   AST 15 - 41 U/L 20   ALT 0 - 44 U/L 25       Latest Ref Rng & Units 10/29/2021   10:30 AM  CBC  WBC 4.0 - 10.5 K/uL 8.4   Hemoglobin 12.0 -  15.0 g/dL 13.9    Hematocrit 36.0 - 46.0 % 41.0   Platelets 150 - 400 K/uL 310     No images are attached to the encounter.  MM Breast Surgical Specimen  Result Date: 09/29/2022 CLINICAL DATA:  Patient status post left breast lumpectomy. EXAM: SPECIMEN RADIOGRAPH OF THE LEFT BREAST COMPARISON:  Previous exam(s). FINDINGS: Status post excision of the left breast. The radioactive seed and biopsy marker clip are present and completely intact. IMPRESSION: Specimen radiograph of the left breast. Electronically Signed   By: Lovey Newcomer M.D.   On: 09/29/2022 11:18  MM LT RADIOACTIVE SEED LOC MAMMO GUIDE  Result Date: 09/28/2022 CLINICAL DATA:  Patient presents for radioactive seed localization prior to surgical excision left breast targeting a coil clip in the upper-outer quadrant. EXAM: MAMMOGRAPHIC GUIDED RADIOACTIVE SEED LOCALIZATION OF THE LEFT BREAST. COMPARISON:  Previous exam(s). FINDINGS: Patient presents for radioactive seed localization prior to surgical excision. I met with the patient and we discussed the procedure of seed localization including benefits and alternatives. We discussed the high likelihood of a successful procedure. We discussed the risks of the procedure including infection, bleeding, tissue injury and further surgery. We discussed the low dose of radioactivity involved in the procedure. Informed, written consent was given. The usual time-out protocol was performed immediately prior to the procedure. Using mammographic guidance, sterile technique, 1% lidocaine and an I-125 radioactive seed, the targeted coil shaped clip over the left upper outer quadrant was localized using a lateral to medial approach. The center of the radioactive seed lies 3-4 mm immediately lateral to the targeted coil clip on the CC image and immediately posterior to the targeted coil clip on the ML image. The follow-up mammogram images confirm the seed in the expected location and were marked for Dr. Donne Hazel. Follow-up  survey of the patient confirms presence of the radioactive seed. Order number of I-125 seed:  [id number]. Total activity:  0.383 millicuries reference Date: 08/17/2022 The patient tolerated the procedure well and was released from the [place]. She was given instructions regarding seed removal. IMPRESSION: Radioactive seed localization left breast. No apparent complications. Electronically Signed   By: Marin Olp M.D.   On: 09/28/2022 14:58    Assessment and plan- Patient is a 56 y.o. female with history of pathological prognostic stage Ia invasive mammary carcinoma of the left breast pT1b N0 M0 ER greater than 90% positive, PR 31 to 40% positive and HER2 negative here to discuss further management  Discussed the results of final pathology with the patient which shows a 7 mm grade 2 invasive mammary carcinoma with negative margins.  1 sentinel lymph node was negative for malignancy.  Given that her tumor was more than 5 mm in size with grade 2 histology I plan to send Oncotype on her final tumor specimen.  Discussed what Oncotype testing is and how the results are interpreted.  If patient falls in the high risk group with a recurrence score of 6 or higher adjuvant chemotherapy would be indicated.  Chemotherapy would not be indicated if she falls in the low risk group with a score of less than 11 or intermediate risk group with a score between 11-25 given that she is more than 56 years of age and postmenopausal.  I will tentatively see her back in 3 weeks time after the results of Oncotype testing are back.  I am also referring her to radiation oncology at this time for consideration of adjuvant radiation.  Given that her  tumor was ER/PR positive endocrine therapy would be indicated upon completion of radiation treatment.  Discussed risks and benefits of both tamoxifen and AI including all but not limited to hot flashes, mood swings.  Risk of arthralgias and worsening bone health associated with AI.   Risk of cataract and DVT associated with tamoxifen.  I will obtain a baseline bone density scan to decide which one would be a better option for her and discussed all this in greater detail at my next visit.  Treatment will be given with a curative intent   Cancer Staging  Malignant neoplasm of upper-outer quadrant of left breast in female, estrogen receptor positive ([place]) Staging form: Breast, AJCC 8th Edition - Clinical stage from 08/29/2022: Stage IA (cT1b, cN0, cM0, G1, ER+, PR+, HER2-) - Signed by Sindy Guadeloupe, MD on 08/29/2022 Stage prefix: Initial diagnosis Histologic grading system: 3 grade system - Pathologic stage from 10/07/2022: Stage IA (pT1b, pN0, cM0, G2, ER+, PR+, HER2-) - Signed by Sindy Guadeloupe, MD on 10/07/2022 Multigene prognostic tests performed: Oncotype DX Histologic grading system: 3 grade system     Visit Diagnosis 1. Malignant neoplasm of upper-outer quadrant of left breast in female, estrogen receptor positive ([place])   2. Goals of care, counseling/discussion      Dr. Randa Evens, MD, MPH [facility] [phone] 10/07/2022 6:09 AM

## 2022-10-10 ENCOUNTER — Encounter (INDEPENDENT_AMBULATORY_CARE_PROVIDER_SITE_OTHER): Payer: Self-pay

## 2022-10-11 ENCOUNTER — Other Ambulatory Visit: Payer: Self-pay

## 2022-10-11 MED ORDER — RIZATRIPTAN BENZOATE 10 MG PO TBDP
ORAL_TABLET | ORAL | 0 refills | Status: DC
  Filled 2022-10-11: qty 10, 20d supply, fill #0

## 2022-10-12 ENCOUNTER — Institutional Professional Consult (permissible substitution): Payer: 59 | Admitting: Radiation Oncology

## 2022-10-12 ENCOUNTER — Other Ambulatory Visit: Payer: Self-pay

## 2022-10-12 DIAGNOSIS — G43709 Chronic migraine without aura, not intractable, without status migrainosus: Secondary | ICD-10-CM | POA: Diagnosis not present

## 2022-10-12 DIAGNOSIS — M542 Cervicalgia: Secondary | ICD-10-CM | POA: Diagnosis not present

## 2022-10-12 MED ORDER — RIZATRIPTAN BENZOATE 10 MG PO TBDP
10.0000 mg | ORAL_TABLET | Freq: Every day | ORAL | 11 refills | Status: DC | PRN
  Filled 2022-10-12: qty 10, 18d supply, fill #0
  Filled 2023-01-09: qty 10, 17d supply, fill #0
  Filled 2023-03-24: qty 10, 17d supply, fill #1
  Filled 2023-04-11: qty 10, 17d supply, fill #2
  Filled 2023-08-06: qty 10, 17d supply, fill #3
  Filled 2023-09-06: qty 10, 17d supply, fill #4
  Filled 2023-09-22: qty 10, 17d supply, fill #5

## 2022-10-12 MED ORDER — ONDANSETRON 4 MG PO TBDP
ORAL_TABLET | ORAL | 11 refills | Status: DC
  Filled 2022-10-12: qty 20, 7d supply, fill #0
  Filled 2023-01-09: qty 20, 7d supply, fill #1
  Filled 2023-03-24: qty 20, 7d supply, fill #2
  Filled 2023-04-11: qty 20, 7d supply, fill #3
  Filled 2023-06-12: qty 20, 7d supply, fill #4
  Filled 2023-08-06: qty 20, 7d supply, fill #5
  Filled 2023-09-06: qty 20, 7d supply, fill #6
  Filled 2023-09-22: qty 20, 7d supply, fill #7

## 2022-10-12 MED ORDER — TOPIRAMATE 25 MG PO TABS
25.0000 mg | ORAL_TABLET | Freq: Two times a day (BID) | ORAL | 11 refills | Status: DC
  Filled 2022-10-12: qty 60, 30d supply, fill #0
  Filled 2023-01-09: qty 60, 30d supply, fill #1
  Filled 2023-04-11: qty 60, 30d supply, fill #2
  Filled 2023-08-06: qty 60, 30d supply, fill #3
  Filled 2023-09-06: qty 60, 30d supply, fill #4
  Filled 2023-09-22: qty 60, 30d supply, fill #5

## 2022-10-13 ENCOUNTER — Ambulatory Visit
Admission: RE | Admit: 2022-10-13 | Discharge: 2022-10-13 | Disposition: A | Discharge status: Home / Self Care | Payer: 59 | Source: Ambulatory Visit | Attending: Radiation Oncology | Admitting: Radiation Oncology

## 2022-10-13 ENCOUNTER — Encounter: Payer: Self-pay | Admitting: Radiation Oncology

## 2022-10-13 VITALS — BP 106/75 | HR 70 | Temp 97.0°F | Resp 16 | Ht 62.0 in | Wt 164.5 lb

## 2022-10-13 DIAGNOSIS — C50412 Malignant neoplasm of upper-outer quadrant of left female breast: Secondary | ICD-10-CM | POA: Diagnosis not present

## 2022-10-13 DIAGNOSIS — Z79899 Other long term (current) drug therapy: Secondary | ICD-10-CM | POA: Insufficient documentation

## 2022-10-13 DIAGNOSIS — K219 Gastro-esophageal reflux disease without esophagitis: Secondary | ICD-10-CM | POA: Insufficient documentation

## 2022-10-13 DIAGNOSIS — Z170 Estrogen receptor positive status [ER+]: Secondary | ICD-10-CM | POA: Insufficient documentation

## 2022-10-13 DIAGNOSIS — M797 Fibromyalgia: Secondary | ICD-10-CM | POA: Insufficient documentation

## 2022-10-13 DIAGNOSIS — G4700 Insomnia, unspecified: Secondary | ICD-10-CM | POA: Insufficient documentation

## 2022-10-13 NOTE — Consult Note (Signed)
NEW PATIENT EVALUATION  Name: Nichole Brown  MRN: [id number]  Date:   10/13/2022     DOB: November 21, 1966   This 56 y.o. female patient presents to the clinic for initial evaluation of stage Ia (pT1b N0 M0) ER/PR positive invasive mammary carcinoma of her left breast status post wide local excision and sentinel node biopsy.  REFERRING PHYSICIAN: Remi Haggard, FNP  CHIEF COMPLAINT:  Chief Complaint  Patient presents with   Breast Cancer    DIAGNOSIS: The encounter diagnosis was Malignant neoplasm of upper-outer quadrant of left female breast, unspecified estrogen receptor status ([place]).   PREVIOUS INVESTIGATIONS:  Mammogram and ultrasound reviewed Pathology reports reviewed Clinical notes reviewed  HPI: Patient is a 56 year old female who presented with an abnormal mammogram of her left breast.  There was an indeterminate 5 mm mass at the 2 o'clock position 3 cm from nipple for which ultrasound-guided biopsy was recommended.  This was performed and positive for invasive mammary carcinoma.  There was no left axillary adenopathy by ultrasound.  She underwent a wide local excision showing a 7 mm overall grade 2 invasive mammary carcinoma ER/PR positive HER2/neu not overexpressed.  Margins were all clear at 5 mm.  1 sentinel lymph node was negative for metastatic disease.  She has done well postoperatively.  Oncotype DX has been sent out and is pending.  She specifically denies breast tenderness cough or bone pain still having some left axillary tenderness.  PLANNED TREATMENT REGIMEN: Left hypofractionated whole breast radiation with breath-hold  PAST MEDICAL HISTORY:  has a past medical history of Anxiety, Bronchitis, Complication of anesthesia, Depression, Fibromyalgia (2005), GERD (gastroesophageal reflux disease), Insomnia, Migraine headache, Pneumonia, and PONV (postoperative nausea and vomiting).    PAST SURGICAL HISTORY:  Past Surgical History:  Procedure Laterality Date    ABDOMINAL HYSTERECTOMY     BALLOON DILATION N/A 12/14/2020   Procedure: BALLOON DILATION;  Surgeon: Eloise Harman, DO;  [facility];  Service: Endoscopy;  Laterality: N/A;   BIOPSY  12/14/2020   Procedure: BIOPSY;  Surgeon: Eloise Harman, DO;  [facility];  Service: Endoscopy;;   BREAST BIOPSY Left 08/24/2022   [place] bx, coil marker path pending   BREAST LUMPECTOMY WITH RADIOACTIVE SEED AND SENTINEL LYMPH NODE BIOPSY Left 09/29/2022   Procedure: LEFT BREAST LUMPECTOMY WITH RADIOACTIVE SEED AND AXILLARY SENTINEL LYMPH NODE BIOPSY;  Surgeon: Rolm Bookbinder, MD;  Location: [place];  Service: General;  Laterality: Left;   CESAREAN SECTION     CHOLECYSTECTOMY  1988   COLONOSCOPY N/A 12/10/2018    six 4-8 mm polyps in proximal descending colon, mid transverse and hepatic flexure. Diverticulosis. External and internal hemorrhoids. Torturous colon. Two simple adenomas, two serrated polyps, 2 hyperplastic polyps. Surveillance due Dec 2022.    ESOPHAGOGASTRODUODENOSCOPY (EGD) WITH PROPOFOL N/A 12/14/2020   Benign-appearing esophageal stenosis s/p dilation, gastritis, normal duodenum. Mild chronic gastritis.    open heart surgery  1967   PATENT DUCTUS ARTERIOUS REPAIR  1967   POLYPECTOMY  12/10/2018   Procedure: POLYPECTOMY;  Surgeon: Danie Binder, MD;  [facility];  Service: Endoscopy;;  hepatic flexure, descending, transverse   SHOULDER ARTHROSCOPY Left 06/16/2017   Procedure: LEFT SHOULDER ARTHROSCOPY, DEBRIDEMENT, AND DECOMPRESSION;  Surgeon: Newt Minion, MD;  Location: [place];  Service: Orthopedics;  Laterality: Left;   SHOULDER ARTHROSCOPY WITH SUBACROMIAL DECOMPRESSION Left 10/03/2019   Procedure: SHOULDER ARTHROSCOPY WITH DEBRIDEMENT, DECOMPRESSION;  Surgeon: Corky Mull, MD;  Location: [facility];  Service: Orthopedics;  Laterality: Left;   SHOULDER SURGERY Left 2008   [facility] joint    FAMILY HISTORY: family history includes  Breast cancer (age of onset: 74) in her mother; Colon cancer in her paternal uncle; Diabetes in her maternal grandmother; Heart disease (age of onset: 9) in her maternal grandmother; Hypertension in her father, maternal grandfather, and maternal grandmother; Throat cancer (age of onset: 39) in her maternal grandfather.  SOCIAL HISTORY:  reports that she has never smoked. She has never used smokeless tobacco. She reports current alcohol use. She reports that she does not use drugs.  ALLERGIES: Cymbalta [duloxetine hcl], Nortriptyline, and Phenergan [promethazine hcl]  MEDICATIONS:  Current Outpatient Medications  Medication Sig Dispense Refill   celecoxib (CELEBREX) 200 MG capsule Take 1 capsule (200 mg total) by mouth 2 (two) times daily 60 capsule 0   Lifitegrast (XIIDRA) 5 % SOLN Instill 1 drop into both eyes twice a day 180 each 4   ondansetron (ZOFRAN-ODT) 4 MG disintegrating tablet Take 1 tablet (4 mg total) by mouth every 8 (eight) hours as needed for nausea or vomiting. 20 tablet 0   ondansetron (ZOFRAN-ODT) 4 MG disintegrating tablet Take 1 tablet (4 mg total) by mouth every 8 (eight) hours as needed for Nausea migraine 20 tablet 11   rizatriptan (MAXALT-MLT) 10 MG disintegrating tablet Take 1 tablet (10 mg total) by mouth once as needed (may repeat dose once in > 2 hrs) for up to 1 dose 10 tablet 0   rizatriptan (MAXALT-MLT) 10 MG disintegrating tablet Take 1 tablet (10 mg total) by mouth once as needed (may repeat dose once in > 2 hrs) for up to 1 dose 10 tablet 11   tiZANidine (ZANAFLEX) 4 MG tablet Take 1 tablet (4 mg total) by mouth 3 (three) times daily 60 tablet 1   topiramate (TOPAMAX) 25 MG tablet Take 1 tablet (25 mg total) by mouth 2 (two) times daily 60 tablet 11   traMADol (ULTRAM) 50 MG tablet Take 1 tablet (50 mg total) by mouth every 6 (six) hours as needed. 10 tablet 0   No current facility-administered medications for this encounter.    ECOG PERFORMANCE STATUS:  0 -  Asymptomatic  REVIEW OF SYSTEMS: Patient denies any weight loss, fatigue, weakness, fever, chills or night sweats. Patient denies any loss of vision, blurred vision. Patient denies any ringing  of the ears or hearing loss. No irregular heartbeat. Patient denies heart murmur or history of fainting. Patient denies any chest pain or pain radiating to her upper extremities. Patient denies any shortness of breath, difficulty breathing at night, cough or hemoptysis. Patient denies any swelling in the lower legs. Patient denies any nausea vomiting, vomiting of blood, or coffee ground material in the vomitus. Patient denies any stomach pain. Patient states has had normal bowel movements no significant constipation or diarrhea. Patient denies any dysuria, hematuria or significant nocturia. Patient denies any problems walking, swelling in the joints or loss of balance. Patient denies any skin changes, loss of hair or loss of weight. Patient denies any excessive worrying or anxiety or significant depression. Patient denies any problems with insomnia. Patient denies excessive thirst, polyuria, polydipsia. Patient denies any swollen glands, patient denies easy bruising or easy bleeding. Patient denies any recent infections, allergies or URI. Patient "s visual fields have not changed significantly in recent time.   PHYSICAL EXAM: BP 106/75 (BP Location: Right Arm, Patient Position: Sitting, Cuff Size: Normal)   Pulse 70  Temp (!) 97 F (36.1 C) (Tympanic)   Resp 16   Ht _0  (1.575 m)   Wt 164 lb 8 oz (74.6 kg)   BMI 30.09 kg/m  Patient status post wide local excision as well as sentinel lymph node biopsy incisions both healing well.  No dominant masses noted in either breast no axillary or supraclavicular adenopathy is identified.  Well-developed well-nourished patient in NAD. HEENT reveals PERLA, EOMI, discs not visualized.  Oral cavity is clear. No oral mucosal lesions are identified. Neck is clear without  evidence of cervical or supraclavicular adenopathy. Lungs are clear to A&P. Cardiac examination is essentially unremarkable with regular rate and rhythm without murmur rub or thrill. Abdomen is benign with no organomegaly or masses noted. Motor sensory and DTR levels are equal and symmetric in the upper and lower extremities. Cranial nerves II through XII are grossly intact. Proprioception is intact. No peripheral adenopathy or edema is identified. No motor or sensory levels are noted. Crude visual fields are within normal range.  LABORATORY DATA: Pathology reports reviewed    RADIOLOGY RESULTS: Mammogram and ultrasound reviewed compatible with above-stated findings   IMPRESSION: Stage Ia ER/PR positive invasive mammary carcinoma of the left breast and 56 year old female status post wide local excision and sentinel node biopsy with Oncotype DX pending  PLAN: This time I have recommended whole breast radiation therapy and hypofractionated course of treatment over 3 weeks.  Would also boost her scar another 1000 cGy using either electron or photon beam therapy depending on her lumpectomy site characteristics.  Risks and benefits of treatment occluding skin reaction fatigue alteration of blood counts possible inclusion of superficial lung all were discussed in detail with the patient.  We will await Oncotype DX results and I have tentatively set up for CT simulation in about a week and a half time.  We will not go ahead with simulation before those results are in.  Patient comprehends my recommendations well.  She also will benefit from endocrine therapy after completion of radiation.  I would like to take this opportunity to thank you for allowing me to participate in the care of your patient.Noreene Filbert, MD

## 2022-10-14 ENCOUNTER — Other Ambulatory Visit: Payer: Self-pay

## 2022-10-14 MED ORDER — CELECOXIB 200 MG PO CAPS
ORAL_CAPSULE | ORAL | 2 refills | Status: DC
  Filled 2022-10-14: qty 60, 30d supply, fill #0

## 2022-10-18 DIAGNOSIS — Z170 Estrogen receptor positive status [ER+]: Secondary | ICD-10-CM | POA: Diagnosis not present

## 2022-10-18 DIAGNOSIS — C50412 Malignant neoplasm of upper-outer quadrant of left female breast: Secondary | ICD-10-CM | POA: Diagnosis not present

## 2022-10-19 ENCOUNTER — Encounter: Payer: Self-pay | Admitting: Oncology

## 2022-10-20 ENCOUNTER — Ambulatory Visit
Admission: RE | Admit: 2022-10-20 | Discharge: 2022-10-20 | Disposition: A | Discharge status: Home / Self Care | Payer: 59 | Source: Ambulatory Visit | Attending: Oncology | Admitting: Oncology

## 2022-10-20 ENCOUNTER — Ambulatory Visit: Payer: 59 | Attending: General Surgery | Admitting: Rehabilitation

## 2022-10-20 ENCOUNTER — Encounter: Payer: Self-pay | Admitting: Rehabilitation

## 2022-10-20 ENCOUNTER — Encounter (HOSPITAL_COMMUNITY): Payer: Self-pay

## 2022-10-20 DIAGNOSIS — M8589 Other specified disorders of bone density and structure, multiple sites: Secondary | ICD-10-CM | POA: Diagnosis not present

## 2022-10-20 DIAGNOSIS — R29898 Other symptoms and signs involving the musculoskeletal system: Secondary | ICD-10-CM | POA: Diagnosis not present

## 2022-10-20 DIAGNOSIS — Z483 Aftercare following surgery for neoplasm: Secondary | ICD-10-CM

## 2022-10-20 DIAGNOSIS — M25612 Stiffness of left shoulder, not elsewhere classified: Secondary | ICD-10-CM

## 2022-10-20 DIAGNOSIS — C50412 Malignant neoplasm of upper-outer quadrant of left female breast: Secondary | ICD-10-CM | POA: Insufficient documentation

## 2022-10-20 DIAGNOSIS — Z170 Estrogen receptor positive status [ER+]: Secondary | ICD-10-CM | POA: Diagnosis not present

## 2022-10-20 NOTE — Therapy (Signed)
OUTPATIENT PHYSICAL THERAPY BREAST CANCER POST OP FOLLOW UP   Patient Name: Nichole Brown MRN: [id number] DOB:[birth year], 56 y.o., female Today's Date: 10/20/2022   PT End of Session - 10/20/22 0902     Visit Number 6    Number of Visits 6    PT Start Time 0904    Activity Tolerance Patient tolerated treatment well    Behavior During Therapy [facility] for tasks assessed/performed             Past Medical History:  Diagnosis Date   Anxiety    Bronchitis    Complication of anesthesia    PONV   Depression    Fibromyalgia 2005   GERD (gastroesophageal reflux disease)    Insomnia    Migraine headache    Pneumonia    2000   PONV (postoperative nausea and vomiting)    Past Surgical History:  Procedure Laterality Date   ABDOMINAL HYSTERECTOMY     BALLOON DILATION N/A 12/14/2020   Procedure: BALLOON DILATION;  Surgeon: Eloise Harman, DO;  [facility];  Service: Endoscopy;  Laterality: N/A;   BIOPSY  12/14/2020   Procedure: BIOPSY;  Surgeon: Eloise Harman, DO;  [facility];  Service: Endoscopy;;   BREAST BIOPSY Left 08/24/2022   [place] bx, coil marker path pending   BREAST LUMPECTOMY WITH RADIOACTIVE SEED AND SENTINEL LYMPH NODE BIOPSY Left 09/29/2022   Procedure: LEFT BREAST LUMPECTOMY WITH RADIOACTIVE SEED AND AXILLARY SENTINEL LYMPH NODE BIOPSY;  Surgeon: Rolm Bookbinder, MD;  Location: [place];  Service: General;  Laterality: Left;   CESAREAN SECTION     CHOLECYSTECTOMY  1988   COLONOSCOPY N/A 12/10/2018    six 4-8 mm polyps in proximal descending colon, mid transverse and hepatic flexure. Diverticulosis. External and internal hemorrhoids. Torturous colon. Two simple adenomas, two serrated polyps, 2 hyperplastic polyps. Surveillance due Dec 2022.    ESOPHAGOGASTRODUODENOSCOPY (EGD) WITH PROPOFOL N/A 12/14/2020   Benign-appearing esophageal stenosis s/p dilation, gastritis, normal duodenum. Mild chronic gastritis.    open  heart surgery  1967   PATENT DUCTUS ARTERIOUS REPAIR  1967   POLYPECTOMY  12/10/2018   Procedure: POLYPECTOMY;  Surgeon: Danie Binder, MD;  [facility];  Service: Endoscopy;;  hepatic flexure, descending, transverse   SHOULDER ARTHROSCOPY Left 06/16/2017   Procedure: LEFT SHOULDER ARTHROSCOPY, DEBRIDEMENT, AND DECOMPRESSION;  Surgeon: Newt Minion, MD;  Location: [place];  Service: Orthopedics;  Laterality: Left;   SHOULDER ARTHROSCOPY WITH SUBACROMIAL DECOMPRESSION Left 10/03/2019   Procedure: SHOULDER ARTHROSCOPY WITH DEBRIDEMENT, DECOMPRESSION;  Surgeon: Corky Mull, MD;  Location: [facility];  Service: Orthopedics;  Laterality: Left;   SHOULDER SURGERY Left 2008   [facility]   Patient Active Problem List   Diagnosis Date Noted   Genetic testing 09/20/2022   Malignant neoplasm of upper-outer quadrant of left breast in female, estrogen receptor positive ([place]) 08/29/2022   Goals of care, counseling/discussion 08/29/2022   Low back pain 02/28/2022   Non-recurrent acute suppurative otitis media of right ear without spontaneous rupture of tympanic membrane 03/09/2021   Sore throat 03/09/2021   Dysphagia 11/17/2020   Rhinosinusitis 10/20/2020   Night muscle spasms 10/20/2020   Depression, major, single episode, moderate ([address]) 01/01/2019   Generalized anxiety disorder 01/01/2019   Chest pain 12/24/2018   Wellness examination    Taking multiple medications for chronic disease 07/30/2018   Impingement syndrome of left shoulder    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  Gastroesophageal reflux disease 12/26/2016   Grief at loss of child 11/09/2016   Sternal fracture 10/06/2015   Contusion of leg 10/06/2015   Migraine headache 07/24/2013   Insomnia 05/30/2013   Fibromyalgia 05/30/2013    PCP: Threasa Alpha, NP   REFERRING PROVIDER: Dr. Donne Hazel  REFERRING DIAG: Lt breast cancer  THERAPY DIAG:  Malignant neoplasm of upper-outer quadrant of left breast in  female, estrogen receptor positive ([place])  Shoulder weakness  Stiffness of left shoulder, not elsewhere classified  Rationale for Evaluation and Treatment: Rehabilitation  ONSET DATE: 08/29/22  SUBJECTIVE:                                                                                                                                                                                           SUBJECTIVE STATEMENT: Surgery went well.  I will talk to them about chemotherapy but will be having radiation for sure.  Will be having radiation in [place]  PERTINENT HISTORY:  This mass was biopsied and was consistent with invasive mammary carcinoma grade 1 ER positive greater than 90%, PR +31 to 40% and HER2 negative. Hx of 3 shoulder surgeries on the left prior to surgery.   Had a left lumpectomy and SLNB on 09/29/22.  1 negative lymph node removed.    PATIENT GOALS:  Reassess how my recovery is going related to arm function, pain, and swelling.  PAIN:  Are you having pain? No  PRECAUTIONS: Recent Surgery, left UE Lymphedema risk  ACTIVITY LEVEL / LEISURE: I go back on 11/20.    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    POSTURE:  WNL   LYMPHEDEMA ASSESSMENT:   UPPER EXTREMITY AROM/PROM:   A/PROM LEFT eval   10/20/22  Shoulder extension 25 - pain 45 - pn  Shoulder flexion 140 - pain 140  Shoulder abduction 125- pain 145  Shoulder internal rotation     Shoulder external rotation 82 - pn 85 - pn                          (Blank rows = not tested)   LYMPHEDEMA ASSESSMENTS:    LANDMARK RIGHT eval 10/20/22  10 cm proximal to olecranon process 29.5   Olecranon process 25.5   10 cm proximal to ulnar styloid process 22.5   Just proximal to ulnar styloid process 15.8   Across hand at thumb web space 18.8   At base of 2nd digit 6.1   (Blank rows = not tested)  LANDMARK LEFT    eval 10/20/22  10 cm proximal to olecranon process 30.0 30  Olecranon process 25.5 25.5  10 cm proximal to ulnar styloid process 20.5 20.5  Just proximal to ulnar styloid process 16 15.5  Across hand at thumb web space 18.7 18.3  At base of 2nd digit 6.0 6.0  (Blank rows = not tested)   PATIENT EDUCATION:  Education details: post op instructions per below Person educated: Patient Education method: Explanation Education comprehension: verbalized understanding  HOME EXERCISE PROGRAM: Reviewed previously given post op HEP.   ASSESSMENT:  CLINICAL IMPRESSION: Pt is doing well post lumpectomy and removal of 1 lymph node.  She performed some pre-operative PT and feels like that allowed her to complete positioning without trouble and has improved ROM compared to baseline.  She does not have any cancer related needs but may return tocontinue orthopedic related shoulder therapy after radiation.    Pt will benefit from skilled therapeutic intervention to improve on the following deficits: Decreased knowledge of precautions, impaired UE functional use, pain, decreased ROM, postural dysfunction.   PT treatment/interventions: ADL/Self care home management, Patient/Family education   GOALS: Goals reviewed with patient? Yes  LONG TERM GOALS:  (STG=LTG)  GOALS Name Target Date  Goal status  1 Pt will demonstrate she has regained full shoulder ROM and function post operatively compared to baselines.  Baseline: 10/20/22 MET   PLAN:  PT FREQUENCY/DURATION: SOZO only  PLAN FOR NEXT SESSION: SOZO only   [facility]  [address], [address]  [place] [address]  [phone]  After Breast Cancer Class It is recommended you attend the ABC class to be educated on lymphedema risk reduction. This class is free of charge and lasts for 1 hour. It is a 1-time class. You will need to download the Webex app either on your phone or computer. We will send you a link the  night before or the morning of the class. You should be able to click on that link to join the class. This is not a confidential class. You don't have to turn your camera on, but other participants may be able to see your email address.  Scar massage You can begin gentle scar massage to you incision sites. Gently place one hand on the incision and move the skin (without sliding on the skin) in various directions. Do this for a few minutes and then you can gently massage either coconut oil or vitamin E cream into the scars.  Compression garment You should continue wearing your compression bra until you feel like you no longer have swelling.  Home exercise Program Continue doing the exercises you were given until you feel like you can do them without feeling any tightness at the end.   Walking Program Studies show that 30 minutes of walking per day (fast enough to elevate your heart rate) can significantly reduce the risk of a cancer recurrence. If you can't walk due to other medical reasons, we encourage you to find another activity you could do (like a stationary bike or water exercise).  Posture After breast cancer surgery, people frequently sit with rounded shoulders posture because it puts their incisions on slack and feels better. If you sit like this and scar tissue forms in that position, you can become very tight and have pain sitting or standing with good posture. Try to be aware of your posture and sit and stand up tall to heal properly.  Follow up PT: It  is recommended you return every 3 months for the first 3 years following surgery to be assessed on the SOZO machine for an L-Dex score. This helps prevent clinically significant lymphedema in 95% of patients. These follow up screens are 10 minute appointments that you are not billed for.  Stark Bray, PT 10/20/2022, 9:03 AM  PHYSICAL THERAPY DISCHARGE SUMMARY  Visits from Start of Care: 6  Current functional level related to  goals / functional outcomes: DC from PT with continuation of SOZO screens   Remaining deficits: Lyphedema risk, chronic shoulder pain with need for TSR   Education / Equipment: HEP  Plan: Patient agrees to discharge.  Patient is being discharged due to meeting the stated rehab goals.

## 2022-10-24 ENCOUNTER — Encounter: Payer: Self-pay | Admitting: Oncology

## 2022-10-24 ENCOUNTER — Telehealth: Payer: 59 | Admitting: Oncology

## 2022-10-24 ENCOUNTER — Ambulatory Visit: Payer: 59

## 2022-10-24 ENCOUNTER — Inpatient Hospital Stay: Payer: 59 | Attending: Oncology | Admitting: Oncology

## 2022-10-24 ENCOUNTER — Encounter: Payer: Self-pay | Admitting: *Deleted

## 2022-10-24 VITALS — BP 112/76 | HR 72 | Temp 97.9°F | Resp 18 | Wt 166.5 lb

## 2022-10-24 DIAGNOSIS — R200 Anesthesia of skin: Secondary | ICD-10-CM | POA: Diagnosis not present

## 2022-10-24 DIAGNOSIS — Z170 Estrogen receptor positive status [ER+]: Secondary | ICD-10-CM | POA: Diagnosis not present

## 2022-10-24 DIAGNOSIS — M8580 Other specified disorders of bone density and structure, unspecified site: Secondary | ICD-10-CM | POA: Diagnosis not present

## 2022-10-24 DIAGNOSIS — M797 Fibromyalgia: Secondary | ICD-10-CM | POA: Diagnosis not present

## 2022-10-24 DIAGNOSIS — K219 Gastro-esophageal reflux disease without esophagitis: Secondary | ICD-10-CM | POA: Diagnosis not present

## 2022-10-24 DIAGNOSIS — Z7189 Other specified counseling: Secondary | ICD-10-CM | POA: Diagnosis not present

## 2022-10-24 DIAGNOSIS — Z79899 Other long term (current) drug therapy: Secondary | ICD-10-CM | POA: Diagnosis not present

## 2022-10-24 DIAGNOSIS — C50412 Malignant neoplasm of upper-outer quadrant of left female breast: Secondary | ICD-10-CM

## 2022-10-24 NOTE — Progress Notes (Signed)
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  This was followed by a diagnostic mammogram and ultrasound which showed multiple benign cysts noted at the 1 o'clock position.  At the 2 o'clock position 3 cm from the nipple was a oval hypoechoic mass measuring 5 x 4 x 3 mm.  No suspicious left axillary adenopathy.  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   Final pathology showed grade two 7 mm tumor with negative margins.  1 sentinel lymph node negative for malignancy.  Oncotype results showed a recurrence score of 28 and absolute benefit of chemotherapy at greater than 15%.    Interval history-patient is doing well post lumpectomy.  Prior to her breast cancer diagnosis she has been undergoing evaluation for symptoms of numbness involving her bilateral feet although she has not been formally diagnosed with any kind of neuropathy.  ECOG PS- 0 Pain scale- 0   Review of systems- Review of Systems  Constitutional:  Negative for chills, fever, malaise/fatigue and weight loss.  HENT:  Negative for congestion, ear discharge and nosebleeds.   Eyes:   Negative for blurred vision.  Respiratory:  Negative for cough, hemoptysis, sputum production, shortness of breath and wheezing.   Cardiovascular:  Negative for chest pain, palpitations, orthopnea and claudication.  Gastrointestinal:  Negative for abdominal pain, blood in stool, constipation, diarrhea, heartburn, melena, nausea and vomiting.  Genitourinary:  Negative for dysuria, flank pain, frequency, hematuria and urgency.  Musculoskeletal:  Negative for back pain, joint pain and myalgias.  Skin:  Negative for rash.  Neurological:  Negative for dizziness, tingling, focal weakness, seizures, weakness and headaches.  Endo/Heme/Allergies:  Does not bruise/bleed easily.  Psychiatric/Behavioral:  Negative for depression and suicidal ideas. The patient does not have insomnia.       Allergies  Allergen Reactions   Cymbalta [Duloxetine Hcl] Other (See Comments)    Hallucinations; sleep walking   Nortriptyline Nausea And Vomiting   Phenergan [Promethazine Hcl]     Restless legs and cramping     Past Medical History:  Diagnosis Date   Anxiety    Bronchitis    Complication of anesthesia    PONV   Depression    Fibromyalgia 2005   GERD (gastroesophageal reflux disease)    Insomnia    Migraine headache    Pneumonia    2000   PONV (postoperative nausea and vomiting)      Past Surgical History:  Procedure Laterality Date   ABDOMINAL HYSTERECTOMY     BALLOON DILATION N/A 12/14/2020   Procedure: BALLOON DILATION;  Surgeon: Eloise Harman, DO;  [facility];  Service: Endoscopy;  Laterality: N/A;   BIOPSY  12/14/2020   Procedure: BIOPSY;  Surgeon: Abbey Chatters,  Elon Alas, DO;  [facility];  Service: Endoscopy;;   BREAST BIOPSY Left 08/24/2022   [place] bx, coil marker path pending   BREAST LUMPECTOMY WITH RADIOACTIVE SEED AND SENTINEL LYMPH NODE BIOPSY Left 09/29/2022   Procedure: LEFT BREAST LUMPECTOMY WITH RADIOACTIVE SEED AND AXILLARY SENTINEL LYMPH NODE BIOPSY;   Surgeon: Rolm Bookbinder, MD;  Location: [place];  Service: General;  Laterality: Left;   CESAREAN SECTION     CHOLECYSTECTOMY  1988   COLONOSCOPY N/A 12/10/2018    six 4-8 mm polyps in proximal descending colon, mid transverse and hepatic flexure. Diverticulosis. External and internal hemorrhoids. Torturous colon. Two simple adenomas, two serrated polyps, 2 hyperplastic polyps. Surveillance due Dec 2022.    ESOPHAGOGASTRODUODENOSCOPY (EGD) WITH PROPOFOL N/A 12/14/2020   Benign-appearing esophageal stenosis s/p dilation, gastritis, normal duodenum. Mild chronic gastritis.    open heart surgery  1967   PATENT DUCTUS ARTERIOUS REPAIR  1967   POLYPECTOMY  12/10/2018   Procedure: POLYPECTOMY;  Surgeon: Danie Binder, MD;  [facility];  Service: Endoscopy;;  hepatic flexure, descending, transverse   SHOULDER ARTHROSCOPY Left 06/16/2017   Procedure: LEFT SHOULDER ARTHROSCOPY, DEBRIDEMENT, AND DECOMPRESSION;  Surgeon: Newt Minion, MD;  Location: [place];  Service: Orthopedics;  Laterality: Left;   SHOULDER ARTHROSCOPY WITH SUBACROMIAL DECOMPRESSION Left 10/03/2019   Procedure: SHOULDER ARTHROSCOPY WITH DEBRIDEMENT, DECOMPRESSION;  Surgeon: Corky Mull, MD;  Location: [facility];  Service: Orthopedics;  Laterality: Left;   SHOULDER SURGERY Left 2008   [facility] joint    Social History   Socioeconomic History   Marital status: Married    Spouse name: daniel   Number of children: 4   Years of education: Not on file   Highest education level: Some college, no degree  Occupational History   Not on file  Tobacco Use   Smoking status: Never   Smokeless tobacco: Never  Vaping Use   Vaping Use: Never used  Substance and Sexual Activity   Alcohol use: Yes    Comment: occassionally   Drug use: No   Sexual activity: Yes    Birth control/protection: Surgical  Other Topics Concern   Not on file  Social History Narrative   Not on file   Social Determinants of  Health   Financial Resource Strain: Low Risk  (07/12/2022)   Overall Financial Resource Strain (CARDIA)    Difficulty of Paying Living Expenses: Not hard at all  Food Insecurity: No Food Insecurity (07/12/2022)   Hunger Vital Sign    Worried About Running Out of Food in the Last Year: Never true    [place] in the Last Year: Never true  Transportation Needs: No Transportation Needs (07/12/2022)   PRAPARE - Hydrologist (Medical): No    Lack of Transportation (Non-Medical): No  Physical Activity: Insufficiently Active (07/12/2022)   Exercise Vital Sign    Days of Exercise per Week: 3 days    Minutes of Exercise per Session: 30 min  Stress: Stress Concern Present (07/12/2022)   [place]    Feeling of Stress : To some extent  Social Connections: Moderately Integrated (07/12/2022)   Social Connection and Isolation Panel [NHANES]    Frequency of Communication with Friends and Family: Once a week    Frequency of Social Gatherings with Friends and Family: Once a week    Attends Religious Services: More than 4 times per year  Active Member of Clubs or Organizations: Yes    Attends Archivist Meetings: More than 4 times per year    Marital Status: Married  Human resources officer Violence: Not At Risk (07/12/2022)   Humiliation, Afraid, Rape, and Kick questionnaire    Fear of Current or Ex-Partner: No    Emotionally Abused: No    Physically Abused: No    Sexually Abused: No    Family History  Problem Relation Age of Onset   Breast cancer Mother 6   Hypertension Father    Colon cancer Paternal Uncle        dx 80s   Hypertension Maternal Grandmother    Diabetes Maternal Grandmother    Heart disease Maternal Grandmother 34       MI   Hypertension Maternal Grandfather    Throat cancer Maternal Grandfather 83     Current Outpatient Medications:    celecoxib (CELEBREX) 200 MG capsule,  Take 1 capsule (200 mg total) by mouth 2 (two) times daily, Disp: 60 capsule, Rfl: 2   Lifitegrast (XIIDRA) 5 % SOLN, Instill 1 drop into both eyes twice a day, Disp: 180 each, Rfl: 4   rizatriptan (MAXALT-MLT) 10 MG disintegrating tablet, Take 1 tablet (10 mg total) by mouth once as needed (may repeat dose once in > 2 hrs) for up to 1 dose, Disp: 10 tablet, Rfl: 0   rizatriptan (MAXALT-MLT) 10 MG disintegrating tablet, Take 1 tablet (10 mg total) by mouth once as needed (may repeat dose once in > 2 hrs) for up to 1 dose, Disp: 10 tablet, Rfl: 11   tiZANidine (ZANAFLEX) 4 MG tablet, Take 1 tablet (4 mg total) by mouth 3 (three) times daily, Disp: 60 tablet, Rfl: 1   topiramate (TOPAMAX) 25 MG tablet, Take 1 tablet (25 mg total) by mouth 2 (two) times daily, Disp: 60 tablet, Rfl: 11   traMADol (ULTRAM) 50 MG tablet, Take 1 tablet (50 mg total) by mouth every 6 (six) hours as needed., Disp: 10 tablet, Rfl: 0   ondansetron (ZOFRAN-ODT) 4 MG disintegrating tablet, Take 1 tablet (4 mg total) by mouth every 8 (eight) hours as needed for nausea or vomiting. (Patient not taking: Reported on 10/24/2022), Disp: 20 tablet, Rfl: 0   ondansetron (ZOFRAN-ODT) 4 MG disintegrating tablet, Take 1 tablet (4 mg total) by mouth every 8 (eight) hours as needed for Nausea migraine (Patient not taking: Reported on 10/24/2022), Disp: 20 tablet, Rfl: 11  Physical exam:  Vitals:   10/24/22 0957  BP: 112/76  Pulse: 72  Resp: 18  Temp: 97.9 F (36.6 C)  SpO2: 100%  Weight: 166 lb 8 oz (75.5 kg)   Physical Exam Constitutional:      General: She is not in acute distress. Cardiovascular:     Rate and Rhythm: Normal rate and regular rhythm.     Heart sounds: Normal heart sounds.  Pulmonary:     Effort: Pulmonary effort is normal.  Skin:    General: Skin is warm and dry.  Neurological:     Mental Status: She is alert and oriented to person, place, and time.         Latest Ref Rng & Units 10/29/2021   10:30 AM   CMP  Glucose 70 - 99 mg/dL 127   BUN 6 - 20 mg/dL 14   Creatinine 0.44 - 1.00 mg/dL 0.62   Sodium 135 - 145 mmol/L 136   Potassium 3.5 - 5.1 mmol/L 3.7   Chloride 98 -  111 mmol/L 106   CO2 22 - 32 mmol/L 23   Calcium 8.9 - 10.3 mg/dL 9.2   Total Protein 6.5 - 8.1 g/dL 7.1   Total Bilirubin 0.3 - 1.2 mg/dL 0.5   Alkaline Phos 38 - 126 U/L 75   AST 15 - 41 U/L 20   ALT 0 - 44 U/L 25       Latest Ref Rng & Units 10/29/2021   10:30 AM  CBC  WBC 4.0 - 10.5 K/uL 8.4   Hemoglobin 12.0 - 15.0 g/dL 13.9   Hematocrit 36.0 - 46.0 % 41.0   Platelets 150 - 400 K/uL 310     No images are attached to the encounter.  DG Bone Density  Result Date: 10/20/2022 EXAM: DUAL X-RAY ABSORPTIOMETRY (DXA) FOR BONE MINERAL DENSITY IMPRESSION: Your patient Sarabelle Genson completed a BMD test on 10/20/2022 using the [place] (analysis version: 14.10) manufactured by [employer]. The following summarizes the results of our evaluation. Technologist: LCE PATIENT BIOGRAPHICAL: Name: Zamia, Tyminski Patient ID: [id number] Birth Date: 02/03/1966 Height: 64.0 in. Gender: Female Exam Date: 10/20/2022 Weight: 166.6 lbs. Indications: Caucasian, Early Menopause, Hysterectomy, Oophorectomy Bilateral, POSTmenopausal Fractures: Treatments: Calcium, Vitamin D ASSESSMENT: The BMD measured at AP Spine L1-L4 is 0.989 g/cm2 with a T-score of -1.6. This patient is considered osteopenic according to [facility]) criteria. The scan quality is good. Site Region Measured Measured WHO Young Adult BMD Date       Age      Classification T-score AP Spine L1-L4 10/20/2020 58.8 Osteopenia -1.6 0.989 g/cm2 DualFemur Neck Left 10/20/2022 58.8 Osteopenia -1.5 0.836 g/cm2 World Health Organization [facility]) criteria for post-menopausal, Caucasian Women: Normal:       T-score at or above -1 SD Osteopenia:   T-score between -1 and -2.5 SD Osteoporosis: T-score at or below -2.5 SD RECOMMENDATIONS: 1. All  patients should optimize calcium and vitamin D intake. 2. Consider FDA-approved medical therapies in postmenopausal women and men aged 42 years and older, based on the following: a. A hip or vertebral (clinical or morphometric) fracture b. T-score < -2.5 at the femoral neck or spine after appropriate evaluation to exclude secondary causes c. Low bone mass (T-score between -1.0 and -2.5 at the femoral neck or spine) and a 10-year probability of a hip fracture > 3% or a 10-year probability of a major osteoporosis-related fracture > 20% based on the [place]-adapted WHO algorithm d. Clinician judgment and/or patient preferences may indicate treatment for people with 10-year fracture probabilities above or below these levels FOLLOW-UP: People with diagnosed cases of osteoporosis or at high risk for fracture should have regular bone mineral density tests. For patients eligible for Medicare, routine testing is allowed once every 2 years. The testing frequency can be increased to one year for patients who have rapidly progressing disease, those who are receiving or discontinuing medical therapy to restore bone mass, or have additional risk factors. I have reviewed this report, and agree with the above findings. Mark A. Thornton Papas, M.D. [facility] Your patient NONIE LOCHNER completed a FRAX assessment on 10/20/2022 using the [place] (analysis version: 14.10) manufactured by [employer]. The following summarizes the results of our evaluation. Technologist: LCE PATIENT BIOGRAPHICAL: Name: Kelsy, Polack Patient ID: [id number] Birth Date: 1965-12-21 Height:    64.0 in. Gender:     Female    [age]  Weight:    166.6 lbs. Ethnicity:  White                            Exam Date: 10/20/2022 FRAX* RESULTS:  (version: 3.5) 10-year Probability of Fracture1 Major Osteoporotic Fracture2 Hip Fracture 7.5% 0.6% Population: [place] (Caucasian) Risk Factors: None Based on Femur (Left) Neck BMD 1  -The 10-year probability of fracture may be lower than reported if the patient has received treatment. 2 -Major Osteoporotic Fracture: Clinical Spine, Forearm, Hip or Shoulder *FRAX is a Materials engineer of the [employer] for Metabolic Bone Disease, a [place] (WHO) [facility]. ASSESSMENT: The probability of a major osteoporotic fracture is 7.5% within the next ten years. The probability of a hip fracture is 0.6% within the next ten years. I have reviewed this report and agree with the above findings. Mark A. Thornton Papas, M.D. [facility] Electronically Signed   By: Lavonia Dana M.D.   On: 10/20/2022 15:38   MM Breast Surgical Specimen  Result Date: 09/29/2022 CLINICAL DATA:  Patient status post left breast lumpectomy. EXAM: SPECIMEN RADIOGRAPH OF THE LEFT BREAST COMPARISON:  Previous exam(s). FINDINGS: Status post excision of the left breast. The radioactive seed and biopsy marker clip are present and completely intact. IMPRESSION: Specimen radiograph of the left breast. Electronically Signed   By: Lovey Newcomer M.D.   On: 09/29/2022 11:18  MM LT RADIOACTIVE SEED LOC MAMMO GUIDE  Result Date: 09/28/2022 CLINICAL DATA:  Patient presents for radioactive seed localization prior to surgical excision left breast targeting a coil clip in the upper-outer quadrant. EXAM: MAMMOGRAPHIC GUIDED RADIOACTIVE SEED LOCALIZATION OF THE LEFT BREAST. COMPARISON:  Previous exam(s). FINDINGS: Patient presents for radioactive seed localization prior to surgical excision. I met with the patient and we discussed the procedure of seed localization including benefits and alternatives. We discussed the high likelihood of a successful procedure. We discussed the risks of the procedure including infection, bleeding, tissue injury and further surgery. We discussed the low dose of radioactivity involved in the procedure. Informed, written consent was given. The usual time-out  protocol was performed immediately prior to the procedure. Using mammographic guidance, sterile technique, 1% lidocaine and an I-125 radioactive seed, the targeted coil shaped clip over the left upper outer quadrant was localized using a lateral to medial approach. The center of the radioactive seed lies 3-4 mm immediately lateral to the targeted coil clip on the CC image and immediately posterior to the targeted coil clip on the ML image. The follow-up mammogram images confirm the seed in the expected location and were marked for Dr. Donne Hazel. Follow-up survey of the patient confirms presence of the radioactive seed. Order number of I-125 seed:  [id number]. Total activity:  5.465 millicuries reference Date: 08/17/2022 The patient tolerated the procedure well and was released from the [place]. She was given instructions regarding seed removal. IMPRESSION: Radioactive seed localization left breast. No apparent complications. Electronically Signed   By: Marin Olp M.D.   On: 09/28/2022 14:58    Assessment and plan- Patient is a 56 y.o. female  with history of pathological prognostic stage Ia invasive mammary carcinoma of the left breast pT1b N0 M0 ER greater than 90% positive, PR 31 to 40% positive and HER2 negative.  She is here to discuss Oncotype results and further management  Patient's original biopsy specimen showed a 6 mm grade 1 invasive mammary carcinoma which was upgraded to a grade  2 7 mm finally pathology.  ER greater than 90% positive, PR 31 to 40% positive and HER2 negative.  Oncotype testing shows high risk score of 28 and an absolute chemotherapy benefit of greater than 15%.  I will therefore recommend adjuvant chemotherapy for her.  With node-negative disease I do not think that nonanthracycline regimen such as Taxotere and Cytoxan given IV every 3 weeks for 4 cycles would be reasonable.  Discussed risks and benefits of chemotherapy including all but not limited to nausea, vomiting, low  blood counts, risk of infections and hospitalization.  Risk of peripheral neuropathy associated with Taxotere.  Patient has some existing symptoms of numbness involving her bilateral feet and she has undergone extensive work-up for the same but no formal diagnosis of peripheral neuropathy.  Patient would like to think about all this and decide if she would like to proceed with chemotherapy or not.  She would also need to tell us if she is going to proceed with port placement for chemotherapy or do it with her peripheral IV.  Regardless about about her decision to pursue adjuvant chemotherapy she would benefit from adjuvant radiation therapy as well as endocrine therapy.  Her baseline bone density scan did show osteopenia but her 10-year probability of major osteoporotic fracture was less than 20% and hip fracture was less than 3%.  She therefore does not require any adjuvant bisphosphonates at this time.  Patient will let us know about her decision to pursue chemotherapy in a couple of days.  Based on that we will decide about radiation timing.  Follow-up with me to be decided.  Treatment will be given with a curative intent   Visit Diagnosis 1. Malignant neoplasm of upper-outer quadrant of left breast in female, estrogen receptor positive ([place])   2. Goals of care, counseling/discussion      Dr. Randa Evens, MD, MPH [facility] [phone] 10/24/2022 3:54 PM

## 2022-10-24 NOTE — Progress Notes (Signed)
Met with patient during her follow up appt. With Dr. Janese Banks.

## 2022-10-24 NOTE — Progress Notes (Signed)
Pt states her weight increases and decreases at the same time and is not sure why.

## 2022-10-26 ENCOUNTER — Other Ambulatory Visit: Payer: Self-pay | Admitting: Oncology

## 2022-10-26 ENCOUNTER — Telehealth: Payer: Self-pay | Admitting: *Deleted

## 2022-10-26 ENCOUNTER — Encounter: Payer: Self-pay | Admitting: *Deleted

## 2022-10-26 DIAGNOSIS — Z170 Estrogen receptor positive status [ER+]: Secondary | ICD-10-CM

## 2022-10-26 NOTE — Telephone Encounter (Signed)
FMLA form completed except for next appointment date which is et to be determined and it has been signed by doctor who will let me know when patient will be scheduled for her treatment based on schedule availability

## 2022-10-26 NOTE — Telephone Encounter (Signed)
Received fax from [employer] for FMLA to be completed. Called patient to get info of when and how long FMLA and she states to begin today through feb 2024 with continuous leave and may transition into intermittent depending on how she is feeling. She would like form faxed back to [employer] and would also like a copy of it mailed to her home

## 2022-10-26 NOTE — Progress Notes (Signed)
Ms. Wahlberg called this morning to discuss adjuvant chemo.  She has decided to go ahead and proceed with chemo.   Message sent to Dr. Janese Banks.

## 2022-10-26 NOTE — Progress Notes (Signed)
START ON PATHWAY REGIMEN - Breast     A cycle is every 21 days:     Docetaxel      Cyclophosphamide   **Always confirm dose/schedule in your pharmacy ordering system**  Patient Characteristics: Postoperative without Neoadjuvant Therapy (Pathologic Staging), Invasive Disease, Adjuvant Therapy, HER2 Negative, ER Positive, Node Negative, pT1a, pN57m or pT1b-c, pN0/N123mor pT2 or Higher, pN0, Oncotype High Risk (? 26) Therapeutic Status: Postoperative without Neoadjuvant Therapy (Pathologic Staging) AJCC Grade: G2 AJCC N Category: pN0 AJCC M Category: cM0 ER Status: Positive (+) AJCC 8 Stage Grouping: IA HER2 Status: Negative (-) Oncotype Dx Recurrence Score: 28 AJCC T Category: pT1b PR Status: Positive (+) Has this patient completed genomic testing<= Yes - Oncotype DX(R) Intent of Therapy: Curative Intent, Discussed with Patient

## 2022-10-27 ENCOUNTER — Telehealth: Payer: Self-pay | Admitting: *Deleted

## 2022-10-27 ENCOUNTER — Other Ambulatory Visit: Payer: Self-pay

## 2022-10-27 ENCOUNTER — Ambulatory Visit: Payer: 59 | Admitting: Family Medicine

## 2022-10-27 ENCOUNTER — Encounter: Payer: Self-pay | Admitting: Oncology

## 2022-10-27 ENCOUNTER — Other Ambulatory Visit: Payer: Self-pay | Admitting: Oncology

## 2022-10-27 ENCOUNTER — Encounter: Payer: Self-pay | Admitting: *Deleted

## 2022-10-27 NOTE — Telephone Encounter (Signed)
Per Janne Lab, RN, patient will start treatment 11/11/22, Form completed and faxed to [employer].  I will mail a copy top patient at her request

## 2022-10-27 NOTE — Telephone Encounter (Signed)
error 

## 2022-10-27 NOTE — Progress Notes (Signed)
Spoke to Ms. Saner to give chemo education appt.

## 2022-10-28 NOTE — Telephone Encounter (Signed)
The first start to do chemo  is 12/1. Nichole Brown needed to know in order to fill out FMLA

## 2022-10-31 ENCOUNTER — Inpatient Hospital Stay: Payer: 59

## 2022-10-31 ENCOUNTER — Ambulatory Visit
Admission: RE | Admit: 2022-10-31 | Discharge: 2022-10-31 | Disposition: A | Discharge status: Home / Self Care | Payer: 59 | Source: Ambulatory Visit | Attending: Nurse Practitioner | Admitting: Nurse Practitioner

## 2022-10-31 ENCOUNTER — Inpatient Hospital Stay: Payer: 59 | Admitting: Oncology

## 2022-10-31 ENCOUNTER — Encounter: Payer: Self-pay | Admitting: *Deleted

## 2022-10-31 VITALS — BP 111/75 | HR 91 | Temp 99.6°F | Resp 18

## 2022-10-31 DIAGNOSIS — Z1152 Encounter for screening for COVID-19: Secondary | ICD-10-CM | POA: Insufficient documentation

## 2022-10-31 DIAGNOSIS — J069 Acute upper respiratory infection, unspecified: Secondary | ICD-10-CM | POA: Insufficient documentation

## 2022-10-31 MED ORDER — AMOXICILLIN-POT CLAVULANATE 875-125 MG PO TABS: 1.0000 | ORAL_TABLET | Freq: Two times a day (BID) | ORAL | 0 refills | Status: DC

## 2022-10-31 MED ORDER — BENZONATATE 100 MG PO CAPS: 100.0000 mg | ORAL_CAPSULE | Freq: Three times a day (TID) | ORAL | 0 refills | Status: DC | PRN

## 2022-10-31 NOTE — ED Triage Notes (Signed)
Pt reports stuffy nose, coughing, headache nasal drainage, and "chest ratling" since saturday. Took a covid test but it was negative. Took nyquil and mucinex but no relief.

## 2022-10-31 NOTE — ED Provider Notes (Signed)
[facility]    CSN: [id number] Arrival date & time: 10/31/22  1339      History   Chief Complaint No chief complaint on file.   HPI Nichole Brown is a 56 y.o. female.   Patient presents with 2 days of body aches, congested cough, chest pain after she coughs a lot, chest tightness and chest congestion, nasal congestion, runny nose, postnasal drainage, sneezing, sore throat, headache, bilateral ear pain, decreased appetite, and fatigue.  She denies shortness of breath, ear drainage, abdominal pain, nausea/vomiting, diarrhea, loss of taste or smell, and new rash.  Reports she took an at home COVID test that was negative.  Reports she has had 2 COVID-19 vaccines.  Reports her husband was sick with similar symptoms, however is not feeling better.  Has been taking NyQuil and Mucinex for symptoms with minimal relief.  Patient is concerned because she starts chemotherapy for breast cancer in approximately 10 days and wants to make sure she is feeling better.    Past Medical History:  Diagnosis Date   Anxiety    Bronchitis    Complication of anesthesia    PONV   Depression    Fibromyalgia 2005   GERD (gastroesophageal reflux disease)    Insomnia    Migraine headache    Pneumonia    2000   PONV (postoperative nausea and vomiting)     Patient Active Problem List   Diagnosis Date Noted   Genetic testing 09/20/2022   Malignant neoplasm of upper-outer quadrant of left breast in female, estrogen receptor positive ([place]) 08/29/2022   Goals of care, counseling/discussion 08/29/2022   Low back pain 02/28/2022   Non-recurrent acute suppurative otitis media of right ear without spontaneous rupture of tympanic membrane 03/09/2021   Sore throat 03/09/2021   Dysphagia 11/17/2020   Rhinosinusitis 10/20/2020   Night muscle spasms 10/20/2020   Depression, major, single episode, moderate ([place]) 01/01/2019   Generalized anxiety disorder 01/01/2019   Chest pain 12/24/2018    Wellness examination    Taking multiple medications for chronic disease 07/30/2018   Impingement syndrome of left shoulder    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   Gastroesophageal reflux disease 12/26/2016   Grief at loss of child 11/09/2016   Sternal fracture 10/06/2015   Contusion of leg 10/06/2015   Migraine headache 07/24/2013   Insomnia 05/30/2013   Fibromyalgia 05/30/2013    Past Surgical History:  Procedure Laterality Date   ABDOMINAL HYSTERECTOMY     BALLOON DILATION N/A 12/14/2020   Procedure: BALLOON DILATION;  Surgeon: Eloise Harman, DO;  [facility];  Service: Endoscopy;  Laterality: N/A;   BIOPSY  12/14/2020   Procedure: BIOPSY;  Surgeon: Eloise Harman, DO;  [facility];  Service: Endoscopy;;   BREAST BIOPSY Left 08/24/2022   [place] bx, coil marker path pending   BREAST LUMPECTOMY WITH RADIOACTIVE SEED AND SENTINEL LYMPH NODE BIOPSY Left 09/29/2022   Procedure: LEFT BREAST LUMPECTOMY WITH RADIOACTIVE SEED AND AXILLARY SENTINEL LYMPH NODE BIOPSY;  Surgeon: Rolm Bookbinder, MD;  Location: [facility];  Service: General;  Laterality: Left;   CESAREAN SECTION     CHOLECYSTECTOMY  1988   COLONOSCOPY N/A 12/10/2018    six 4-8 mm polyps in proximal descending colon, mid transverse and hepatic flexure. Diverticulosis. External and internal hemorrhoids. Torturous colon. Two simple adenomas, two serrated polyps, 2 hyperplastic polyps. Surveillance due Dec 2022.    ESOPHAGOGASTRODUODENOSCOPY (EGD) WITH PROPOFOL N/A 12/14/2020   Benign-appearing  esophageal stenosis s/p dilation, gastritis, normal duodenum. Mild chronic gastritis.    open heart surgery  1967   PATENT DUCTUS ARTERIOUS REPAIR  1967   POLYPECTOMY  12/10/2018   Procedure: POLYPECTOMY;  Surgeon: Danie Binder, MD;  [facility];  Service: Endoscopy;;  hepatic flexure, descending, transverse   SHOULDER ARTHROSCOPY Left 06/16/2017   Procedure: LEFT  SHOULDER ARTHROSCOPY, DEBRIDEMENT, AND DECOMPRESSION;  Surgeon: Newt Minion, MD;  Location: [place];  Service: Orthopedics;  Laterality: Left;   SHOULDER ARTHROSCOPY WITH SUBACROMIAL DECOMPRESSION Left 10/03/2019   Procedure: SHOULDER ARTHROSCOPY WITH DEBRIDEMENT, DECOMPRESSION;  Surgeon: Corky Mull, MD;  Location: [facility];  Service: Orthopedics;  Laterality: Left;   SHOULDER SURGERY Left 2008   AC joint    OB History   No obstetric history on file.      Home Medications    Prior to Admission medications   Medication Sig Start Date End Date Taking? Authorizing Provider  benzonatate (TESSALON) 100 MG capsule Take 1 capsule (100 mg total) by mouth 3 (three) times daily as needed for cough. Do not take with alcohol or while driving or operating heavy machinery.  May cause drowsiness. 10/31/22  Yes Noemi Chapel A, NP  amoxicillin-clavulanate (AUGMENTIN) 875-125 MG tablet Take 1 tablet by mouth 2 (two) times daily for 7 days. 11/07/22 11/14/22  Eulogio Bear, NP  celecoxib (CELEBREX) 200 MG capsule Take 1 capsule (200 mg total) by mouth 2 (two) times daily 10/14/22     Lifitegrast (XIIDRA) 5 % SOLN Instill 1 drop into both eyes twice a day 09/06/22     ondansetron (ZOFRAN-ODT) 4 MG disintegrating tablet Take 1 tablet (4 mg total) by mouth every 8 (eight) hours as needed for nausea or vomiting. Patient not taking: Reported on 10/24/2022 06/12/17   Cuthriell, Charline Bills, PA-C  ondansetron (ZOFRAN-ODT) 4 MG disintegrating tablet Take 1 tablet (4 mg total) by mouth every 8 (eight) hours as needed for Nausea migraine Patient not taking: Reported on 10/24/2022 10/12/22     rizatriptan (MAXALT-MLT) 10 MG disintegrating tablet Take 1 tablet (10 mg total) by mouth once as needed (may repeat dose once in > 2 hrs) for up to 1 dose 10/11/22     rizatriptan (MAXALT-MLT) 10 MG disintegrating tablet Take 1 tablet (10 mg total) by mouth once as needed (may repeat dose once in > 2 hrs) for up to 1 dose  10/12/22     tiZANidine (ZANAFLEX) 4 MG tablet Take 1 tablet (4 mg total) by mouth 3 (three) times daily 08/03/22     topiramate (TOPAMAX) 25 MG tablet Take 1 tablet (25 mg total) by mouth 2 (two) times daily 10/12/22     traMADol (ULTRAM) 50 MG tablet Take 1 tablet (50 mg total) by mouth every 6 (six) hours as needed. 09/29/22   Rolm Bookbinder, MD  sucralfate (CARAFATE) 1 GM/10ML suspension Take 10 mLs (1 g total) by mouth 4 (four) times daily -  with meals and at bedtime. Patient not taking: Reported on 12/07/2020 10/06/20 12/14/20  Chalmers Guest, FNP    Family History Family History  Problem Relation Age of Onset   Breast cancer Mother 27   Hypertension Father    Colon cancer Paternal Uncle        dx 71s   Hypertension Maternal Grandmother    Diabetes Maternal Grandmother    Heart disease Maternal Grandmother 81       MI   Hypertension Maternal Grandfather  Throat cancer Maternal Grandfather 25    Social History Social History   Tobacco Use   Smoking status: Never   Smokeless tobacco: Never  Vaping Use   Vaping Use: Never used  Substance Use Topics   Alcohol use: Yes    Comment: occassionally   Drug use: No     Allergies   Cymbalta [duloxetine hcl], Nortriptyline, and Phenergan [promethazine hcl]   Review of Systems Review of Systems Per HPI  Physical Exam Triage Vital Signs ED Triage Vitals  Enc Vitals Group     BP 10/31/22 1414 111/75     Pulse Rate 10/31/22 1414 91     Resp 10/31/22 1414 18     Temp 10/31/22 1414 99.6 F (37.6 C)     Temp Source 10/31/22 1414 Oral     SpO2 10/31/22 1414 96 %     Weight --      Height --      Head Circumference --      Peak Flow --      Pain Score 10/31/22 1416 8     Pain Loc --      Pain Edu? --      Excl. in [place]? --    No data found.  Updated Vital Signs BP 111/75 (BP Location: Right Arm)   Pulse 91   Temp 99.6 F (37.6 C) (Oral)   Resp 18   SpO2 96%   Visual Acuity Right Eye Distance:   Left Eye  Distance:   Bilateral Distance:    Right Eye Near:   Left Eye Near:    Bilateral Near:     Physical Exam Vitals and nursing note reviewed.  Constitutional:      General: She is not in acute distress.    Appearance: Normal appearance. She is not ill-appearing or toxic-appearing.  HENT:     Head: Normocephalic and atraumatic.     Right Ear: Ear canal and external ear normal. No drainage, swelling or tenderness. Tympanic membrane is erythematous. Tympanic membrane is not perforated or bulging.     Left Ear: Tympanic membrane, ear canal and external ear normal.     Nose: Congestion and rhinorrhea present.     Mouth/Throat:     Mouth: Mucous membranes are moist.     Pharynx: Oropharynx is clear. No oropharyngeal exudate or posterior oropharyngeal erythema.  Eyes:     General: No scleral icterus.    Extraocular Movements: Extraocular movements intact.  Cardiovascular:     Rate and Rhythm: Normal rate and regular rhythm.  Pulmonary:     Effort: Pulmonary effort is normal. No respiratory distress.     Breath sounds: Normal breath sounds. No wheezing, rhonchi or rales.  Abdominal:     General: Abdomen is flat. Bowel sounds are normal. There is no distension.     Palpations: Abdomen is soft.     Tenderness: There is no abdominal tenderness. There is no guarding.  Musculoskeletal:     Cervical back: Normal range of motion and neck supple.  Lymphadenopathy:     Cervical: Cervical adenopathy present.  Skin:    General: Skin is warm and dry.     Coloration: Skin is not jaundiced or pale.     Findings: No erythema or rash.  Neurological:     Mental Status: She is alert and oriented to person, place, and time.  Psychiatric:        Behavior: Behavior is cooperative.      UC Treatments /  Results  Labs (all labs ordered are listed, but only abnormal results are displayed) Labs Reviewed  SARS CORONAVIRUS 2 (TAT 6-24 HRS)    EKG   Radiology No results  found.  Procedures Procedures (including critical care time)  Medications Ordered in UC Medications - No data to display  Initial Impression / Assessment and Plan / UC Course  I have reviewed the triage vital signs and the nursing notes.  Pertinent labs & imaging results that were available during my care of the patient were reviewed by me and considered in my medical decision making (see chart for details).   Patient is well-appearing, normotensive, afebrile, not tachycardic, not tachypneic, oxygenating well on room air.    Viral URI with cough Encounter for screening for COVID-19 Suspect viral etiology COVID-19 testing obtained Supportive care discussed-start cough suppressant Patient is concern for potential of developing bacterial sinus infection-we will send postdated Augmentin for 1 week to pharmacy that she can start on in 1 week if she is not feeling better ER and return precautions discussed  The patient was given the opportunity to ask questions.  All questions answered to their satisfaction.  The patient is in agreement to this plan.   Final Clinical Impressions(s) / UC Diagnoses   Final diagnoses:  Viral URI with cough  Encounter for screening for COVID-19     Discharge Instructions      You have a viral upper respiratory infection.  Symptoms should improve over the next week to 10 days.  If you develop chest pain or shortness of breath, go to the emergency room.  If your symptoms have not improved after 1 week, go ahead and start on the Augmentin (antibiotic).    We have tested you today for COVID-19  You will see the results in Mychart and we will call you with positive results.    Please stay home and isolate until you are aware of the results.    Some things that can make you feel better are: - Increased rest - Increasing fluid with water/sugar free electrolytes - Acetaminophen and ibuprofen as needed for fever/pain - Salt water gargling, chloraseptic spray  and throat lozenges - OTC guaifenesin (Mucinex) 600 mg twice daily - Saline sinus flushes or a neti pot - Humidifying the air -Tessalon Perles during the day as needed for dry cough     ED Prescriptions     Medication Sig Dispense Auth. Provider   benzonatate (TESSALON) 100 MG capsule Take 1 capsule (100 mg total) by mouth 3 (three) times daily as needed for cough. Do not take with alcohol or while driving or operating heavy machinery.  May cause drowsiness. 21 capsule Noemi Chapel A, NP   amoxicillin-clavulanate (AUGMENTIN) 875-125 MG tablet  (Status: Discontinued) Take 1 tablet by mouth 2 (two) times daily for 7 days. 14 tablet Noemi Chapel A, NP   amoxicillin-clavulanate (AUGMENTIN) 875-125 MG tablet Take 1 tablet by mouth 2 (two) times daily for 7 days. 14 tablet Eulogio Bear, NP      PDMP not reviewed this encounter.   Eulogio Bear, NP 10/31/22 [phone]

## 2022-10-31 NOTE — Progress Notes (Signed)
Nichole Brown is not feeling well, she was not able to make it to chemo class.  This has been rescheduled to 11/28.  She has been given the appt. Details.

## 2022-10-31 NOTE — Discharge Instructions (Addendum)
You have a viral upper respiratory infection.  Symptoms should improve over the next week to 10 days.  If you develop chest pain or shortness of breath, go to the emergency room.  If your symptoms have not improved after 1 week, go ahead and start on the Augmentin (antibiotic).    We have tested you today for COVID-19  You will see the results in Mychart and we will call you with positive results.    Please stay home and isolate until you are aware of the results.    Some things that can make you feel better are: - Increased rest - Increasing fluid with water/sugar free electrolytes - Acetaminophen and ibuprofen as needed for fever/pain - Salt water gargling, chloraseptic spray and throat lozenges - OTC guaifenesin (Mucinex) 600 mg twice daily - Saline sinus flushes or a neti pot - Humidifying the air -Tessalon Perles during the day as needed for dry cough

## 2022-11-01 LAB — SARS CORONAVIRUS 2 (TAT 6-24 HRS): SARS Coronavirus 2: NEGATIVE

## 2022-11-07 ENCOUNTER — Telehealth: Payer: Self-pay

## 2022-11-07 ENCOUNTER — Telehealth: Payer: Self-pay | Admitting: *Deleted

## 2022-11-07 NOTE — Telephone Encounter (Signed)
Short term disability forms have been faxed to [employer] at [phone] and receieved a fax confirmation.

## 2022-11-07 NOTE — Telephone Encounter (Signed)
Form completed and sent for physician signature

## 2022-11-07 NOTE — Telephone Encounter (Signed)
Received a fax for disability claim

## 2022-11-08 ENCOUNTER — Other Ambulatory Visit: Payer: Self-pay | Admitting: General Surgery

## 2022-11-08 ENCOUNTER — Encounter: Payer: Self-pay | Admitting: Oncology

## 2022-11-08 ENCOUNTER — Inpatient Hospital Stay: Payer: 59

## 2022-11-08 ENCOUNTER — Encounter: Payer: Self-pay | Admitting: *Deleted

## 2022-11-08 NOTE — Progress Notes (Signed)
Patient has decided she would like port placed.   Port placement order faxed to Dr. Cristal Generous office.

## 2022-11-08 NOTE — Telephone Encounter (Signed)
Form signed and faxed

## 2022-11-10 ENCOUNTER — Ambulatory Visit: Payer: 59 | Admitting: Family Medicine

## 2022-11-10 ENCOUNTER — Ambulatory Visit (HOSPITAL_COMMUNITY): Payer: 59 | Admitting: Licensed Clinical Social Worker

## 2022-11-10 DIAGNOSIS — F4310 Post-traumatic stress disorder, unspecified: Secondary | ICD-10-CM

## 2022-11-10 DIAGNOSIS — C50412 Malignant neoplasm of upper-outer quadrant of left female breast: Secondary | ICD-10-CM | POA: Diagnosis not present

## 2022-11-10 MED FILL — Dexamethasone Sodium Phosphate Inj 100 MG/10ML: INTRAMUSCULAR | Qty: 1 | Status: AC

## 2022-11-10 NOTE — Progress Notes (Unsigned)
   THERAPIST PROGRESS NOTE  Session Time: 9-10a  [place] in office visit for patient and LCSW clinician  Participation Level: Active  Behavioral Response: Neat and Well GroomedAlertAnxious and Euthymic  Type of Therapy: Individual Therapy  Treatment Goals addressed: Problem: Reduce the negative impact trauma related symptoms have on social, occupational, and family functioning. Goal: LTG: Reduce frequency, intensity, and duration of PTSD symptoms so daily functioning is improved: Input needed on appropriate metric.  Pt self report. Outcome: Progressing Goal: STG: Practice interpersonal effectiveness skills 7 times per week for the next 16 weeks Outcome: Progressing Intervention: Encourage verbalization of feelings/concerns/expectations Note: Allowed pt to explore/express Intervention: Identify needs related to health status changes Note: Allowed pt to explore psychological impact of recent diagnosis   ProgressTowards Goals: Progressing  Interventions: CBT and Other: trauma focused  Summary: Nichole Brown is a 56 y.o. female who presents with continuing symptoms related to PTSD/anxiety.  Allowed pt to explore and express thoughts and feelings associated with recent life situations and external stressors. Nichole Brown reports that she is continuing to experience stress associated with diagnosis and treatment of breast cancer. Patient reports that she has already gotten surgery to remove cancer, and only needs to do follow-up chemotherapy and radiation to complete treatment process. Patient reports that she is being intentional about having a positive attitude towards her treatment and prognosis. Patient reports that Her husband is very supportive, her workplace is very supportive, and she feels support with her family members. Patient reports that she is prioritizing family and spending time with family. Patient reports that she originally got the diagnosis in  July of 2023 and had her surgery in August. Patient is not very happy with the way that there were some delays in her medical treatment, but is happy with her overall treatment team. Allowed patient safe space to explore her thoughts and feelings and fears associated with her diagnosis and treatment.  Reviewed importance of self-care and taking time off work if needed. Patient reflects understanding.   Pt reports that family members are very supportive--no issues or concerns. Pt reports some continuing work-related stress.  Reviewed emotion regulation, coping skills for depression and anxiety management.     Continued recommendations are as follows: self care behaviors, positive social engagements, focusing on overall work/home/life balance, and focusing on positive physical and emotional wellness.   Suicidal/Homicidal: No  Therapist Response: Pt is continuing to apply interventions learned in session into daily life situations. Pt is currently on track to meet goals utilizing interventions mentioned above. Personal growth and progress noted. Treatment to continue as indicated.   Plan: Return again in 4 weeks.  Diagnosis:  Encounter Diagnosis  Name Primary?   PTSD (post-traumatic stress disorder) Yes    Collaboration of Care: Other Pt encouraged to continue psychiatric care with Dr. Modesta Messing  Patient/Guardian was advised Release of Information must be obtained prior to any record release in order to collaborate their care with an outside provider. Patient/Guardian was advised if they have not already done so to contact the registration department to sign all necessary forms in order for [place] to release information regarding their care.   Consent: Patient/Guardian gives verbal consent for treatment and assignment of benefits for services provided during this visit. Patient/Guardian expressed understanding and agreed to proceed.   King, LCSW 11/10/2022

## 2022-11-11 ENCOUNTER — Inpatient Hospital Stay (HOSPITAL_BASED_OUTPATIENT_CLINIC_OR_DEPARTMENT_OTHER): Payer: 59 | Admitting: Oncology

## 2022-11-11 ENCOUNTER — Encounter (HOSPITAL_BASED_OUTPATIENT_CLINIC_OR_DEPARTMENT_OTHER): Payer: Self-pay | Admitting: General Surgery

## 2022-11-11 ENCOUNTER — Encounter: Payer: Self-pay | Admitting: Oncology

## 2022-11-11 ENCOUNTER — Inpatient Hospital Stay: Payer: 59

## 2022-11-11 ENCOUNTER — Inpatient Hospital Stay: Payer: 59 | Attending: Oncology

## 2022-11-11 ENCOUNTER — Telehealth: Payer: Self-pay | Admitting: *Deleted

## 2022-11-11 ENCOUNTER — Other Ambulatory Visit: Payer: Self-pay

## 2022-11-11 ENCOUNTER — Encounter: Payer: Self-pay | Admitting: *Deleted

## 2022-11-11 VITALS — BP 118/77 | HR 79 | Temp 98.1°F | Resp 19

## 2022-11-11 VITALS — BP 112/84 | HR 79 | Temp 98.1°F | Wt 162.0 lb

## 2022-11-11 DIAGNOSIS — Z79899 Other long term (current) drug therapy: Secondary | ICD-10-CM | POA: Diagnosis not present

## 2022-11-11 DIAGNOSIS — Z5111 Encounter for antineoplastic chemotherapy: Secondary | ICD-10-CM | POA: Insufficient documentation

## 2022-11-11 DIAGNOSIS — Z170 Estrogen receptor positive status [ER+]: Secondary | ICD-10-CM

## 2022-11-11 DIAGNOSIS — K219 Gastro-esophageal reflux disease without esophagitis: Secondary | ICD-10-CM | POA: Insufficient documentation

## 2022-11-11 DIAGNOSIS — C50412 Malignant neoplasm of upper-outer quadrant of left female breast: Secondary | ICD-10-CM | POA: Diagnosis not present

## 2022-11-11 DIAGNOSIS — Z5189 Encounter for other specified aftercare: Secondary | ICD-10-CM | POA: Insufficient documentation

## 2022-11-11 DIAGNOSIS — Z7952 Long term (current) use of systemic steroids: Secondary | ICD-10-CM | POA: Insufficient documentation

## 2022-11-11 DIAGNOSIS — Z791 Long term (current) use of non-steroidal anti-inflammatories (NSAID): Secondary | ICD-10-CM | POA: Insufficient documentation

## 2022-11-11 DIAGNOSIS — G4700 Insomnia, unspecified: Secondary | ICD-10-CM | POA: Diagnosis not present

## 2022-11-11 DIAGNOSIS — G629 Polyneuropathy, unspecified: Secondary | ICD-10-CM | POA: Diagnosis not present

## 2022-11-11 LAB — COMPREHENSIVE METABOLIC PANEL
ALT: 42 U/L (ref 0–44)
AST: 25 U/L (ref 15–41)
Albumin: 4.1 g/dL (ref 3.5–5.0)
Alkaline Phosphatase: 87 U/L (ref 38–126)
Anion gap: 11 (ref 5–15)
BUN: 14 mg/dL (ref 6–20)
CO2: 26 mmol/L (ref 22–32)
Calcium: 9.4 mg/dL (ref 8.9–10.3)
Chloride: 103 mmol/L (ref 98–111)
Creatinine, Ser: 0.61 mg/dL (ref 0.44–1.00)
GFR, Estimated: >60 mL/min (ref >60)
Glucose, Bld: 121 mg/dL — ABNORMAL HIGH (ref 70–99)
Potassium: 3.7 mmol/L (ref 3.5–5.1)
Sodium: 140 mmol/L (ref 135–145)
Total Bilirubin: 0.4 mg/dL (ref 0.3–1.2)
Total Protein: 7.1 g/dL (ref 6.5–8.1)

## 2022-11-11 LAB — CBC WITH DIFFERENTIAL/PLATELET
Abs Immature Granulocytes: 0.05 10*3/uL (ref 0.00–0.07)
Basophils Absolute: 0.1 10*3/uL (ref 0.0–0.1)
Basophils Relative: 1 %
Eosinophils Absolute: 0.4 10*3/uL (ref 0.0–0.5)
Eosinophils Relative: 4 %
HCT: 42.6 % (ref 36.0–46.0)
Hemoglobin: 14.3 g/dL (ref 12.0–15.0)
Immature Granulocytes: 1 %
Lymphocytes Relative: 40 %
Lymphs Abs: 3.5 10*3/uL (ref 0.7–4.0)
MCH: 29.5 pg (ref 26.0–34.0)
MCHC: 33.6 g/dL (ref 30.0–36.0)
MCV: 88 fL (ref 80.0–100.0)
Monocytes Absolute: 0.7 10*3/uL (ref 0.1–1.0)
Monocytes Relative: 8 %
Neutro Abs: 4 10*3/uL (ref 1.7–7.7)
Neutrophils Relative %: 46 %
Platelets: 278 10*3/uL (ref 150–400)
RBC: 4.84 MIL/uL (ref 3.87–5.11)
RDW: 13.2 % (ref 11.5–15.5)
WBC: 8.6 10*3/uL (ref 4.0–10.5)
nRBC: 0 % (ref 0.0–0.2)

## 2022-11-11 MED ORDER — DEXAMETHASONE 4 MG PO TABS
ORAL_TABLET | ORAL | 1 refills | Status: DC
  Filled 2022-11-11: qty 30, 5d supply, fill #0

## 2022-11-11 MED ORDER — PROCHLORPERAZINE MALEATE 10 MG PO TABS
10.0000 mg | ORAL_TABLET | Freq: Four times a day (QID) | ORAL | 1 refills | Status: DC | PRN
  Filled 2022-11-11: qty 30, 8d supply, fill #0

## 2022-11-11 MED ORDER — SODIUM CHLORIDE 0.9 % IV SOLN
10.0000 mg | Freq: Once | INTRAVENOUS | Status: AC
  Administered 2022-11-11: 10 mg via INTRAVENOUS
  Filled 2022-11-11: qty 10

## 2022-11-11 MED ORDER — SODIUM CHLORIDE 0.9 % IV SOLN
75.0000 mg/m2 | Freq: Once | INTRAVENOUS | Status: AC
  Administered 2022-11-11: 140 mg via INTRAVENOUS
  Filled 2022-11-11: qty 14

## 2022-11-11 MED ORDER — SODIUM CHLORIDE 0.9 % IV SOLN
Freq: Once | INTRAVENOUS | Status: AC
  Filled 2022-11-11: qty 250

## 2022-11-11 MED ORDER — ENSURE PRE-SURGERY PO LIQD: 296.0000 mL | Freq: Once | ORAL | Status: DC

## 2022-11-11 MED ORDER — PALONOSETRON HCL INJECTION 0.25 MG/5ML
0.2500 mg | Freq: Once | INTRAVENOUS | Status: AC
  Administered 2022-11-11: 0.25 mg via INTRAVENOUS
  Filled 2022-11-11: qty 5

## 2022-11-11 MED ORDER — LIDOCAINE-PRILOCAINE 2.5-2.5 % EX CREA
TOPICAL_CREAM | CUTANEOUS | 3 refills | Status: DC
  Filled 2022-11-11: qty 30, 30d supply, fill #0
  Filled 2023-01-09: qty 30, 30d supply, fill #1

## 2022-11-11 MED ORDER — SODIUM CHLORIDE 0.9 % IV SOLN
600.0000 mg/m2 | Freq: Once | INTRAVENOUS | Status: AC
  Administered 2022-11-11: 1100 mg via INTRAVENOUS
  Filled 2022-11-11: qty 55

## 2022-11-11 NOTE — Plan of Care (Signed)
  Problem: Reduce the negative impact trauma related symptoms have on social, occupational, and family functioning. Goal: LTG: Reduce frequency, intensity, and duration of PTSD symptoms so daily functioning is improved: Input needed on appropriate metric.  Pt self report. Outcome: Progressing Goal: STG: Practice interpersonal effectiveness skills 7 times per week for the next 16 weeks Outcome: Progressing Intervention: Encourage verbalization of feelings/concerns/expectations Note: Allowed pt to explore/express Intervention: Identify needs related to health status changes Note: Allowed pt to explore and express

## 2022-11-11 NOTE — Patient Instructions (Addendum)
[facility] [place]  Discharge Instructions: Thank you for choosing [facility] to provide your oncology and hematology care.  If you have a lab appointment with the [facility], please go directly to the [facility] and check in at the registration area.  Wear comfortable clothing and clothing appropriate for easy access to any Portacath or PICC line.   We strive to give you quality time with your provider. You may need to reschedule your appointment if you arrive late (15 or more minutes).  Arriving late affects you and other patients whose appointments are after yours.  Also, if you miss three or more appointments without notifying the office, you may be dismissed from the clinic at the provider's discretion.      For prescription refill requests, have your pharmacy contact our office and allow 72 hours for refills to be completed.    Today you received the following chemotherapy and/or immunotherapy agents Taxotere, Cytoxan    To help prevent nausea and vomiting after your treatment, we encourage you to take your nausea medication as directed.  BELOW ARE SYMPTOMS THAT SHOULD BE REPORTED IMMEDIATELY: *FEVER GREATER THAN 100.4 F (38 C) OR HIGHER *CHILLS OR SWEATING *NAUSEA AND VOMITING THAT IS NOT CONTROLLED WITH YOUR NAUSEA MEDICATION *UNUSUAL SHORTNESS OF BREATH *UNUSUAL BRUISING OR BLEEDING *URINARY PROBLEMS (pain or burning when urinating, or frequent urination) *BOWEL PROBLEMS (unusual diarrhea, constipation, pain near the anus) TENDERNESS IN MOUTH AND THROAT WITH OR WITHOUT PRESENCE OF ULCERS (sore throat, sores in mouth, or a toothache) UNUSUAL RASH, SWELLING OR PAIN  UNUSUAL VAGINAL DISCHARGE OR ITCHING   Items with * indicate a potential emergency and should be followed up as soon as possible or go to the Emergency Department if any problems should occur.  Please show the CHEMOTHERAPY ALERT CARD or IMMUNOTHERAPY ALERT CARD at  check-in to the Emergency Department and triage nurse.  Should you have questions after your visit or need to cancel or reschedule your appointment, please contact [facility] [place] [facility] [place]  [phone] and follow the prompts.  Office hours are 8:00 a.m. to 4:30 p.m. Monday - Friday. Please note that voicemails left after 4:00 p.m. may not be returned until the following business day.  We are closed weekends and major holidays. You have access to a nurse at all times for urgent questions. Please call the main number to the clinic [phone] and follow the prompts.  For any non-urgent questions, you may also contact your provider using MyChart. We now offer e-Visits for anyone 50 and older to request care online for non-urgent symptoms. For details visit [email or web address].   Also download the MyChart app! Go to the app store, search "MyChart", open the app, select [facility] [facility], and log in with your MyChart username and password.  Masks are optional in the cancer centers. If you would like for your care team to wear a mask while they are taking care of you, please let them know. For doctor visits, patients may have with them one support person who is at least 58 years old. At this time, visitors are not allowed in the infusion area.    Docetaxel Injection What is this medication? DOCETAXEL (doe se TAX el) treats some types of cancer. It works by slowing down the growth of cancer cells. This medicine may be used for other purposes; ask your health care provider or pharmacist if you have questions. COMMON BRAND NAME(S): Docefrez, Taxotere What should I tell my  care team before I take this medication? They need to know if you have any of these conditions: Kidney disease Liver disease Low white blood cell levels Tingling of the fingers or toes or other nerve disorder An unusual or allergic reaction to docetaxel, polysorbate 80, other medications, foods, dyes, or  preservatives Pregnant or trying to get pregnant Breast-feeding How should I use this medication? This medication is injected into a vein. It is given by your care team in a hospital or clinic setting. Talk to your care team about the use of this medication in children. Special care may be needed. Overdosage: If you think you have taken too much of this medicine contact a poison control center or emergency room at once. NOTE: This medicine is only for you. Do not share this medicine with others. What if I miss a dose? Keep appointments for follow-up doses. It is important not to miss your dose. Call your care team if you are unable to keep an appointment. What may interact with this medication? Do not take this medication with any of the following: Live virus vaccines This medication may also interact with the following: Certain antibiotics, such as clarithromycin, telithromycin Certain antivirals for HIV or hepatitis Certain medications for fungal infections, such as itraconazole, ketoconazole, voriconazole Grapefruit juice Nefazodone Supplements, such as St. John's wort This list may not describe all possible interactions. Give your health care provider a list of all the medicines, herbs, non-prescription drugs, or dietary supplements you use. Also tell them if you smoke, drink alcohol, or use illegal drugs. Some items may interact with your medicine. What should I watch for while using this medication? This medication may make you feel generally unwell. This is not uncommon as chemotherapy can affect healthy cells as well as cancer cells. Report any side effects. Continue your course of treatment even though you feel ill unless your care team tells you to stop. You may need blood work done while you are taking this medication. This medication can cause serious side effects and infusion reactions. To reduce the risk, your care team may give you other medications to take before receiving  this one. Be sure to follow the directions from your care team. This medication may increase your risk of getting an infection. Call your care team for advice if you get a fever, chills, sore throat, or other symptoms of a cold or flu. Do not treat yourself. Try to avoid being around people who are sick. Avoid taking medications that contain aspirin, acetaminophen, ibuprofen, naproxen, or ketoprofen unless instructed by your care team. These medications may hide a fever. Be careful brushing or flossing your teeth or using a toothpick because you may get an infection or bleed more easily. If you have any dental work done, tell your dentist you are receiving this medication. Some products may contain alcohol. Ask your care team if this medication contains alcohol. Be sure to tell all care teams you are taking this medicine. Certain medications, like metronidazole and disulfiram, can cause an unpleasant reaction when taken with alcohol. The reaction includes flushing, headache, nausea, vomiting, sweating, and increased thirst. The reaction can last from 30 minutes to several hours. This medication may affect your coordination, reaction time, or judgement. Do not drive or operate machinery until you know how this medication affects you. Sit up or stand slowly to reduce the risk of dizzy or fainting spells. Drinking alcohol with this medication can increase the risk of these side effects. Talk to your  care team about your risk of cancer. You may be more at risk for certain types of cancer if you take this medication. Talk to your care team if you wish to become pregnant or think you might be pregnant. This medication can cause serious birth defects if taken during pregnancy or if you get pregnant within 2 months after stopping therapy. A negative pregnancy test is required before starting this medication. A reliable form of contraception is recommended while taking this medication and for 2 months after stopping  it. Talk to your care team about reliable forms of contraception. Do not breast-feed while taking this medication and for 1 week after stopping therapy. Use a condom during sex and for 4 months after stopping therapy. Tell your care team right away if you think your partner might be pregnant. This medication can cause serious birth defects. This medication may cause infertility. Talk to your care team if you are concerned about your fertility. What side effects may I notice from receiving this medication? Side effects that you should report to your care team as soon as possible: Allergic reactions--skin rash, itching, hives, swelling of the face, lips, tongue, or throat Change in vision such as blurry vision, seeing halos around lights, vision loss Infection--fever, chills, cough, or sore throat Infusion reactions--chest pain, shortness of breath or trouble breathing, feeling faint or lightheaded Low red blood cell level--unusual weakness or fatigue, dizziness, headache, trouble breathing Pain, tingling, or numbness in the hands or feet Painful swelling, warmth, or redness of the skin, blisters or sores at the infusion site Redness, blistering, peeling, or loosening of the skin, including inside the mouth Sudden or severe stomach pain, bloody diarrhea, fever, nausea, vomiting Swelling of the ankles, hands, or feet Tumor lysis syndrome (TLS)--nausea, vomiting, diarrhea, decrease in the amount of urine, dark urine, unusual weakness or fatigue, confusion, muscle pain or cramps, fast or irregular heartbeat, joint pain Unusual bruising or bleeding Side effects that usually do not require medical attention (report to your care team if they continue or are bothersome): Change in nail shape, thickness, or color Change in taste Hair loss Increased tears This list may not describe all possible side effects. Call your doctor for medical advice about side effects. You may report side effects to FDA at  1-800-FDA-1088. Where should I keep my medication? This medication is given in a hospital or clinic. It will not be stored at home. NOTE: This sheet is a summary. It may not cover all possible information. If you have questions about this medicine, talk to your doctor, pharmacist, or health care provider.  2023 Elsevier/Gold Standard (2008-01-19 00:00:00)  Cyclophosphamide Injection What is this medication? CYCLOPHOSPHAMIDE (sye kloe FOSS fa mide) treats some types of cancer. It works by slowing down the growth of cancer cells. This medicine may be used for other purposes; ask your health care provider or pharmacist if you have questions. COMMON BRAND NAME(S): Cyclophosphamide, Cytoxan, Neosar What should I tell my care team before I take this medication? They need to know if you have any of these conditions: Heart disease Irregular heartbeat or rhythm Infection Kidney problems Liver disease Low blood cell levels (white cells, platelets, or red blood cells) Lung disease Previous radiation Trouble passing urine An unusual or allergic reaction to cyclophosphamide, other medications, foods, dyes, or preservatives Pregnant or trying to get pregnant Breast-feeding How should I use this medication? This medication is injected into a vein. It is given by your care team in a hospital or  clinic setting. Talk to your care team about the use of this medication in children. Special care may be needed. Overdosage: If you think you have taken too much of this medicine contact a poison control center or emergency room at once. NOTE: This medicine is only for you. Do not share this medicine with others. What if I miss a dose? Keep appointments for follow-up doses. It is important not to miss your dose. Call your care team if you are unable to keep an appointment. What may interact with this medication? Amphotericin B Amiodarone Azathioprine Certain antivirals for HIV or hepatitis Certain  medications for blood pressure, such as enalapril, lisinopril, quinapril Cyclosporine Diuretics Etanercept Indomethacin Medications that relax muscles Metronidazole Natalizumab Tamoxifen Warfarin This list may not describe all possible interactions. Give your health care provider a list of all the medicines, herbs, non-prescription drugs, or dietary supplements you use. Also tell them if you smoke, drink alcohol, or use illegal drugs. Some items may interact with your medicine. What should I watch for while using this medication? This medication may make you feel generally unwell. This is not uncommon as chemotherapy can affect healthy cells as well as cancer cells. Report any side effects. Continue your course of treatment even though you feel ill unless your care team tells you to stop. You may need blood work while you are taking this medication. This medication may increase your risk of getting an infection. Call your care team for advice if you get a fever, chills, sore throat, or other symptoms of a cold or flu. Do not treat yourself. Try to avoid being around people who are sick. Avoid taking medications that contain aspirin, acetaminophen, ibuprofen, naproxen, or ketoprofen unless instructed by your care team. These medications may hide a fever. Be careful brushing or flossing your teeth or using a toothpick because you may get an infection or bleed more easily. If you have any dental work done, tell your dentist you are receiving this medication. Drink water or other fluids as directed. Urinate often, even at night. Some products may contain alcohol. Ask your care team if this medication contains alcohol. Be sure to tell all care teams you are taking this medicine. Certain medicines, like metronidazole and disulfiram, can cause an unpleasant reaction when taken with alcohol. The reaction includes flushing, headache, nausea, vomiting, sweating, and increased thirst. The reaction can last  from 30 minutes to several hours. Talk to your care team if you wish to become pregnant or think you might be pregnant. This medication can cause serious birth defects if taken during pregnancy and for 1 year after the last dose. A negative pregnancy test is required before starting this medication. A reliable form of contraception is recommended while taking this medication and for 1 year after the last dose. Talk to your care team about reliable forms of contraception. Do not father a child while taking this medication and for 4 months after the last dose. Use a condom during this time period. Do not breast-feed while taking this medication or for 1 week after the last dose. This medication may cause infertility. Talk to your care team if you are concerned about your fertility. Talk to your care team about your risk of cancer. You may be more at risk for certain types of cancer if you take this medication. What side effects may I notice from receiving this medication? Side effects that you should report to your care team as soon as possible: Allergic reactions--skin rash,  itching, hives, swelling of the face, lips, tongue, or throat Dry cough, shortness of breath or trouble breathing Heart failure--shortness of breath, swelling of the ankles, feet, or hands, sudden weight gain, unusual weakness or fatigue Heart muscle inflammation--unusual weakness or fatigue, shortness of breath, chest pain, fast or irregular heartbeat, dizziness, swelling of the ankles, feet, or hands Heart rhythm changes--fast or irregular heartbeat, dizziness, feeling faint or lightheaded, chest pain, trouble breathing Infection--fever, chills, cough, sore throat, wounds that don't heal, pain or trouble when passing urine, general feeling of discomfort or being unwell Kidney injury--decrease in the amount of urine, swelling of the ankles, hands, or feet Liver injury--right upper belly pain, loss of appetite, nausea, light-colored  stool, dark yellow or brown urine, yellowing skin or eyes, unusual weakness or fatigue Low red blood cell level--unusual weakness or fatigue, dizziness, headache, trouble breathing Low sodium level--muscle weakness, fatigue, dizziness, headache, confusion Red or dark brown urine Unusual bruising or bleeding Side effects that usually do not require medical attention (report to your care team if they continue or are bothersome): Hair loss Irregular menstrual cycles or spotting Loss of appetite Nausea Pain, redness, or swelling with sores inside the mouth or throat Vomiting This list may not describe all possible side effects. Call your doctor for medical advice about side effects. You may report side effects to FDA at 1-800-FDA-1088. Where should I keep my medication? This medication is given in a hospital or clinic. It will not be stored at home. NOTE: This sheet is a summary. It may not cover all possible information. If you have questions about this medicine, talk to your doctor, pharmacist, or health care provider.  2023 Elsevier/Gold Standard (2022-01-18 00:00:00)

## 2022-11-11 NOTE — Progress Notes (Signed)
   11/11/22 1215  PAT Phone Screen  Is the patient taking a GLP-1 receptor agonist? No  Do You Have Diabetes? No  Do You Have Hypertension? No  Have You Ever Been to the ER for Asthma? No  Have You Taken Oral Steroids in the Past 3 Months? No  Do you Take Phenteramine or any Other Diet Drugs? No  Recent  Lab Work, EKG, CXR? Yes  Where was this test performed? 11-11-22 CBC/diff, [facility]  Do you have a history of heart problems? (S)  Yes (Hx PFO repair at 56yr old, no cardiac issues since)  Cardiologist Name saw Dr AGaren Lah10-24-22  Have you ever had tests on your heart? Yes  What cardiac tests were performed? Echo;Stress Test  What date/year were cardiac tests completed? 12-24-18 ECHO EF 60-65%, 2018 ETT-normal  Results viewable: [facility] Media Tab  Any Recent Hospitalizations? No  Height '5\' 2"'$  (1.575 m)  Weight 73.5 kg  Pat Appointment Scheduled No  Reason for No Appointment Not Needed

## 2022-11-11 NOTE — Telephone Encounter (Signed)
Pt here for new treatment for breast cancer and VS and labs are good to get treatment today

## 2022-11-11 NOTE — Progress Notes (Signed)

## 2022-11-11 NOTE — Progress Notes (Signed)
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  This was followed by a diagnostic mammogram and ultrasound which showed multiple benign cysts noted at the 1 o'clock position.  At the 2 o'clock position 3 cm from the nipple was a oval hypoechoic mass measuring 5 x 4 x 3 mm.  No suspicious left axillary adenopathy.  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   Final pathology showed grade two 7 mm tumor with negative margins.  1 sentinel lymph node negative for malignancy.  Oncotype results showed a recurrence score of 28 and absolute benefit of chemotherapy at greater than 15%.    Interval history-patient is doing well presently and denies any specific complaints at this time.  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  HENT:  Negative for congestion, ear discharge and nosebleeds.   Eyes:  Negative for blurred vision.   Respiratory:  Negative for cough, hemoptysis, sputum production, shortness of breath and wheezing.   Cardiovascular:  Negative for chest pain, palpitations, orthopnea and claudication.  Gastrointestinal:  Negative for abdominal pain, blood in stool, constipation, diarrhea, heartburn, melena, nausea and vomiting.  Genitourinary:  Negative for dysuria, flank pain, frequency, hematuria and urgency.  Musculoskeletal:  Negative for back pain, joint pain and myalgias.  Skin:  Negative for rash.  Neurological:  Negative for dizziness, tingling, focal weakness, seizures, weakness and headaches.  Endo/Heme/Allergies:  Does not bruise/bleed easily.  Psychiatric/Behavioral:  Negative for depression and suicidal ideas. The patient does not have insomnia.       Allergies  Allergen Reactions   Cymbalta [Duloxetine Hcl] Other (See Comments)    Hallucinations; sleep walking   Nortriptyline Nausea And Vomiting   Phenergan [Promethazine Hcl]     Restless legs and cramping     Past Medical History:  Diagnosis Date   Anxiety    Bronchitis    Cancer ([place]) 09/2022   left breast IMC   Complication of anesthesia    PONV   Depression    GERD (gastroesophageal reflux disease)    Insomnia    Migraine headache    Pneumonia    2000   PONV (postoperative nausea and vomiting)      Past Surgical History:  Procedure Laterality Date   ABDOMINAL HYSTERECTOMY     BALLOON DILATION N/A 12/14/2020   Procedure: BALLOON DILATION;  Surgeon: Eloise Harman, DO;  [facility];  Service: Endoscopy;  Laterality: N/A;   BIOPSY  12/14/2020  Procedure: BIOPSY;  Surgeon: Eloise Harman, DO;  [facility];  Service: Endoscopy;;   BREAST BIOPSY Left 08/24/2022   [place] bx, coil marker path pending   BREAST LUMPECTOMY WITH RADIOACTIVE SEED AND SENTINEL LYMPH NODE BIOPSY Left 09/29/2022   Procedure: LEFT BREAST LUMPECTOMY WITH RADIOACTIVE SEED AND AXILLARY SENTINEL LYMPH NODE BIOPSY;  Surgeon:  Rolm Bookbinder, MD;  Location: [place];  Service: General;  Laterality: Left;   CESAREAN SECTION     CHOLECYSTECTOMY  1988   COLONOSCOPY N/A 12/10/2018    six 4-8 mm polyps in proximal descending colon, mid transverse and hepatic flexure. Diverticulosis. External and internal hemorrhoids. Torturous colon. Two simple adenomas, two serrated polyps, 2 hyperplastic polyps. Surveillance due Dec 2022.    ESOPHAGOGASTRODUODENOSCOPY (EGD) WITH PROPOFOL N/A 12/14/2020   Benign-appearing esophageal stenosis s/p dilation, gastritis, normal duodenum. Mild chronic gastritis.    open heart surgery  1967   PATENT DUCTUS ARTERIOUS REPAIR  1967   POLYPECTOMY  12/10/2018   Procedure: POLYPECTOMY;  Surgeon: Danie Binder, MD;  [facility];  Service: Endoscopy;;  hepatic flexure, descending, transverse   SHOULDER ARTHROSCOPY Left 06/16/2017   Procedure: LEFT SHOULDER ARTHROSCOPY, DEBRIDEMENT, AND DECOMPRESSION;  Surgeon: Newt Minion, MD;  Location: [place];  Service: Orthopedics;  Laterality: Left;   SHOULDER ARTHROSCOPY WITH SUBACROMIAL DECOMPRESSION Left 10/03/2019   Procedure: SHOULDER ARTHROSCOPY WITH DEBRIDEMENT, DECOMPRESSION;  Surgeon: Corky Mull, MD;  Location: [facility];  Service: Orthopedics;  Laterality: Left;   SHOULDER SURGERY Left 2008   [facility] joint    Social History   Socioeconomic History   Marital status: Married    Spouse name: daniel   Number of children: 4   Years of education: Not on file   Highest education level: Some college, no degree  Occupational History   Not on file  Tobacco Use   Smoking status: Never   Smokeless tobacco: Never  Vaping Use   Vaping Use: Never used  Substance and Sexual Activity   Alcohol use: Yes    Comment: occassionally-not since chemo started   Drug use: No   Sexual activity: Yes    Birth control/protection: Surgical    Comment: hyst  Other Topics Concern   Not on file  Social History Narrative   Not on file    Social Determinants of Health   Financial Resource Strain: Low Risk  (07/12/2022)   Overall Financial Resource Strain (CARDIA)    Difficulty of Paying Living Expenses: Not hard at all  Food Insecurity: No Food Insecurity (07/12/2022)   Hunger Vital Sign    Worried About Running Out of Food in the Last Year: Never true    [place] in the Last Year: Never true  Transportation Needs: No Transportation Needs (07/12/2022)   PRAPARE - Hydrologist (Medical): No    Lack of Transportation (Non-Medical): No  Physical Activity: Insufficiently Active (07/12/2022)   Exercise Vital Sign    Days of Exercise per Week: 3 days    Minutes of Exercise per Session: 30 min  Stress: Stress Concern Present (07/12/2022)   [place]    Feeling of Stress : To some extent  Social Connections: Moderately Integrated (07/12/2022)   Social Connection and Isolation Panel [NHANES]    Frequency of Communication with Friends and Family: Once a week    Frequency of Social Gatherings with Friends and Family: Once a  week    Attends Religious Services: More than 4 times per year    Active Member of Clubs or Organizations: Yes    Attends Archivist Meetings: More than 4 times per year    Marital Status: Married  Human resources officer Violence: Not At Risk (07/12/2022)   Humiliation, Afraid, Rape, and Kick questionnaire    Fear of Current or Ex-Partner: No    Emotionally Abused: No    Physically Abused: No    Sexually Abused: No    Family History  Problem Relation Age of Onset   Breast cancer Mother 66   Hypertension Father    Colon cancer Paternal Uncle        dx 94s   Hypertension Maternal Grandmother    Diabetes Maternal Grandmother    Heart disease Maternal Grandmother 5       MI   Hypertension Maternal Grandfather    Throat cancer Maternal Grandfather 83     Current Outpatient Medications:     acetaminophen (TYLENOL) 500 MG tablet, Take 500 mg by mouth every 6 (six) hours as needed for moderate pain., Disp: , Rfl:    CALCIUM PO, Take 1 capsule by mouth daily., Disp: , Rfl:    Cholecalciferol (VITAMIN D3 PO), Take 1 capsule by mouth daily., Disp: , Rfl:    fluticasone (FLONASE) 50 MCG/ACT nasal spray, Place 1 spray into both nostrils daily as needed for allergies or rhinitis., Disp: , Rfl:    Lifitegrast (XIIDRA) 5 % SOLN, Instill 1 drop into both eyes twice a day, Disp: 180 each, Rfl: 4   ondansetron (ZOFRAN-ODT) 4 MG disintegrating tablet, Take 1 tablet (4 mg total) by mouth every 8 (eight) hours as needed for Nausea migraine, Disp: 20 tablet, Rfl: 11   Probiotic Product (PROBIOTIC PO), Take 1 capsule by mouth daily., Disp: , Rfl:    rizatriptan (MAXALT-MLT) 10 MG disintegrating tablet, Take 1 tablet (10 mg total) by mouth once as needed (may repeat dose once in > 2 hrs) for up to 1 dose, Disp: 10 tablet, Rfl: 11   tiZANidine (ZANAFLEX) 4 MG tablet, Take 1 tablet (4 mg total) by mouth 3 (three) times daily, Disp: 60 tablet, Rfl: 1   topiramate (TOPAMAX) 25 MG tablet, Take 1 tablet (25 mg total) by mouth 2 (two) times daily, Disp: 60 tablet, Rfl: 11   celecoxib (CELEBREX) 200 MG capsule, Take 1 capsule (200 mg total) by mouth 2 (two) times daily, Disp: 60 capsule, Rfl: 2   dexamethasone (DECADRON) 4 MG tablet, Take 2 tabs by mouth 2 times daily starting day before chemo. Then take 2 tabs daily for 2 days starting day after chemo. Take with food., Disp: 30 tablet, Rfl: 1   famotidine (PEPCID) 20 MG tablet, Take 20 mg by mouth 2 (two) times daily., Disp: , Rfl:    lidocaine-prilocaine (EMLA) cream, Apply to affected area once, Disp: 30 g, Rfl: 3   prochlorperazine (COMPAZINE) 10 MG tablet, Take 1 tablet (10 mg total) by mouth every 6 (six) hours as needed for nausea or vomiting., Disp: 30 tablet, Rfl: 1   rizatriptan (MAXALT-MLT) 10 MG disintegrating tablet, Take 1 tablet (10 mg total) by  mouth once as needed (may repeat dose once in > 2 hrs) for up to 1 dose, Disp: 10 tablet, Rfl: 0   traMADol (ULTRAM) 50 MG tablet, Take 1 tablet (50 mg total) by mouth every 6 (six) hours as needed. (Patient not taking: Reported on 11/09/2022), Disp: 10 tablet, Rfl:  0 No current facility-administered medications for this visit.  Facility-Administered Medications Ordered in Other Visits:    cyclophosphamide (CYTOXAN) 1,100 mg in sodium chloride 0.9 % 250 mL chemo infusion, 600 mg/m2 (Treatment Plan Recorded), Intravenous, Once, Sindy Guadeloupe, MD, Last Rate: 610 mL/hr at 11/11/22 1303, 1,100 mg at 11/11/22 1303  Physical exam:  Vitals:   11/11/22 0901  BP: 112/84  Pulse: 79  Temp: 98.1 F (36.7 C)  TempSrc: Tympanic  Weight: 162 lb (73.5 kg)   Physical Exam Cardiovascular:     Rate and Rhythm: Normal rate and regular rhythm.     Heart sounds: Normal heart sounds.  Pulmonary:     Effort: Pulmonary effort is normal.     Breath sounds: Normal breath sounds.  Abdominal:     General: Bowel sounds are normal.     Palpations: Abdomen is soft.  Skin:    General: Skin is warm and dry.  Neurological:     Mental Status: She is alert and oriented to person, place, and time.         Latest Ref Rng & Units 11/11/2022    8:36 AM  CMP  Glucose 70 - 99 mg/dL 121   BUN 6 - 20 mg/dL 14   Creatinine 0.44 - 1.00 mg/dL 0.61   Sodium 135 - 145 mmol/L 140   Potassium 3.5 - 5.1 mmol/L 3.7   Chloride 98 - 111 mmol/L 103   CO2 22 - 32 mmol/L 26   Calcium 8.9 - 10.3 mg/dL 9.4   Total Protein 6.5 - 8.1 g/dL 7.1   Total Bilirubin 0.3 - 1.2 mg/dL 0.4   Alkaline Phos 38 - 126 U/L 87   AST 15 - 41 U/L 25   ALT 0 - 44 U/L 42       Latest Ref Rng & Units 11/11/2022    8:36 AM  CBC  WBC 4.0 - 10.5 K/uL 8.6   Hemoglobin 12.0 - 15.0 g/dL 14.3   Hematocrit 36.0 - 46.0 % 42.6   Platelets 150 - 400 K/uL 278     No images are attached to the encounter.  DG Bone Density  Result Date:  10/20/2022 EXAM: DUAL X-RAY ABSORPTIOMETRY (DXA) FOR BONE MINERAL DENSITY IMPRESSION: Your patient Dalaney Needle completed a BMD test on 10/20/2022 using the [place] (analysis version: 14.10) manufactured by [employer]. The following summarizes the results of our evaluation. Technologist: LCE PATIENT BIOGRAPHICAL: Name: Jessie, Cowher Patient ID: [id number] Birth Date: June 25, 1966 Height: 64.0 in. Gender: Female Exam Date: 10/20/2022 Weight: 166.6 lbs. Indications: Caucasian, Early Menopause, Hysterectomy, Oophorectomy Bilateral, POSTmenopausal Fractures: Treatments: Calcium, Vitamin D ASSESSMENT: The BMD measured at AP Spine L1-L4 is 0.989 g/cm2 with a T-score of -1.6. This patient is considered osteopenic according to [facility]) criteria. The scan quality is good. Site Region Measured Measured WHO Young Adult BMD Date       Age      Classification T-score AP Spine L1-L4 10/20/2022 58.8 Osteopenia -1.6 0.989 g/cm2 DualFemur Neck Left 10/20/2022 58.8 Osteopenia -1.5 0.836 g/cm2 World Health Organization [facility]) criteria for post-menopausal, Caucasian Women: Normal:       T-score at or above -1 SD Osteopenia:   T-score between -1 and -2.5 SD Osteoporosis: T-score at or below -2.5 SD RECOMMENDATIONS: 1. All patients should optimize calcium and vitamin D intake. 2. Consider FDA-approved medical therapies in postmenopausal women and men aged 70 years and older, based on the following: a. A hip  or vertebral (clinical or morphometric) fracture b. T-score < -2.5 at the femoral neck or spine after appropriate evaluation to exclude secondary causes c. Low bone mass (T-score between -1.0 and -2.5 at the femoral neck or spine) and a 10-year probability of a hip fracture > 3% or a 10-year probability of a major osteoporosis-related fracture > 20% based on the [place]-adapted WHO algorithm d. Clinician judgment and/or patient preferences may indicate treatment for people with 10-year  fracture probabilities above or below these levels FOLLOW-UP: People with diagnosed cases of osteoporosis or at high risk for fracture should have regular bone mineral density tests. For patients eligible for Medicare, routine testing is allowed once every 2 years. The testing frequency can be increased to one year for patients who have rapidly progressing disease, those who are receiving or discontinuing medical therapy to restore bone mass, or have additional risk factors. I have reviewed this report, and agree with the above findings. Mark A. Thornton Papas, M.D. [facility] Your patient VEGAS COFFIN completed a FRAX assessment on 10/20/2022 using the [place] (analysis version: 14.10) manufactured by [employer]. The following summarizes the results of our evaluation. Technologist: LCE PATIENT BIOGRAPHICAL: Name: Sharai, [place] Patient ID: [id number] Birth Date: 04-Nov-1966 Height:    64.0 in. Gender:     Female    [age]       Weight:    166.6 lbs. Ethnicity:  White                            Exam Date: 10/20/2022 FRAX* RESULTS:  (version: 3.5) 10-year Probability of Fracture1 Major Osteoporotic Fracture2 Hip Fracture 7.5% 0.6% Population: [place] (Caucasian) Risk Factors: None Based on Femur (Left) Neck BMD 1 -The 10-year probability of fracture may be lower than reported if the patient has received treatment. 2 -Major Osteoporotic Fracture: Clinical Spine, Forearm, Hip or Shoulder *FRAX is a Materials engineer of the [employer] for Metabolic Bone Disease, a [place] (WHO) [facility]. ASSESSMENT: The probability of a major osteoporotic fracture is 7.5% within the next ten years. The probability of a hip fracture is 0.6% within the next ten years. I have reviewed this report and agree with the above findings. Mark A. Thornton Papas, M.D. [facility] Electronically Signed   By: Lavonia Dana M.D.   On: 10/20/2022 15:38      Assessment and plan- Patient is a 56 y.o. female  with history of pathological prognostic stage Ia invasive mammary carcinoma of the left breast pT1b N0 M0 ER greater than 90% positive, PR 31 to 40% positive and HER2 negative.  She is here for on treatment assessment prior to cycle 1 of adjuvant TC chemotherapy  Counts okay to proceed with adjuvant TC chemotherapy today cycle 1 with Udenyca.  Discussed risks and benefits of chemotherapy including all but not limited to nausea vomiting low blood counts, hair loss, risk of infections and hospitalizations.  Treatment will be given with a curative intent.  Have also asked her to use cryotherapy with frozen gloves and mittens to reduce the risk of Taxotere induced neuropathy.  I will see her back in 3 weeks for cycle 2.  Patient will call us in the interim if she has any questions or concerns.   Visit Diagnosis 1. Malignant neoplasm of upper-outer quadrant of left breast in female, estrogen receptor positive ([place])  2. Encounter for antineoplastic chemotherapy      Dr. Randa Evens, MD, MPH [facility] [phone] 11/11/2022 1:06 PM

## 2022-11-11 NOTE — Addendum Note (Signed)
Addended by: Randa Evens C on: 11/11/2022 01:46 PM   Modules accepted: Orders

## 2022-11-11 NOTE — Progress Notes (Signed)
Spoke with Nichole Brown in the lobby prior to her infusion.   She is doing well, no concerns at this time.   Port placement is scheduled for 12/4.

## 2022-11-14 ENCOUNTER — Encounter (HOSPITAL_BASED_OUTPATIENT_CLINIC_OR_DEPARTMENT_OTHER)
Admission: RE | Disposition: A | Discharge status: Home / Self Care | Payer: Self-pay | Source: Home / Self Care | Attending: General Surgery

## 2022-11-14 ENCOUNTER — Other Ambulatory Visit: Payer: Self-pay

## 2022-11-14 ENCOUNTER — Ambulatory Visit (HOSPITAL_BASED_OUTPATIENT_CLINIC_OR_DEPARTMENT_OTHER): Payer: 59 | Admitting: Anesthesiology

## 2022-11-14 ENCOUNTER — Ambulatory Visit (HOSPITAL_COMMUNITY): Payer: 59

## 2022-11-14 ENCOUNTER — Inpatient Hospital Stay: Payer: 59

## 2022-11-14 ENCOUNTER — Ambulatory Visit: Payer: 59

## 2022-11-14 ENCOUNTER — Telehealth: Payer: Self-pay

## 2022-11-14 ENCOUNTER — Ambulatory Visit: Payer: 59 | Admitting: Family Medicine

## 2022-11-14 ENCOUNTER — Telehealth: Payer: Self-pay | Admitting: *Deleted

## 2022-11-14 ENCOUNTER — Encounter (HOSPITAL_BASED_OUTPATIENT_CLINIC_OR_DEPARTMENT_OTHER): Payer: Self-pay | Admitting: General Surgery

## 2022-11-14 ENCOUNTER — Encounter: Payer: Self-pay | Admitting: *Deleted

## 2022-11-14 ENCOUNTER — Ambulatory Visit (HOSPITAL_BASED_OUTPATIENT_CLINIC_OR_DEPARTMENT_OTHER)
Admission: RE | Admit: 2022-11-14 | Discharge: 2022-11-14 | Disposition: A | Discharge status: Home / Self Care | Payer: 59 | Attending: General Surgery | Admitting: General Surgery

## 2022-11-14 DIAGNOSIS — C50412 Malignant neoplasm of upper-outer quadrant of left female breast: Secondary | ICD-10-CM

## 2022-11-14 DIAGNOSIS — K219 Gastro-esophageal reflux disease without esophagitis: Secondary | ICD-10-CM | POA: Insufficient documentation

## 2022-11-14 DIAGNOSIS — F419 Anxiety disorder, unspecified: Secondary | ICD-10-CM | POA: Insufficient documentation

## 2022-11-14 DIAGNOSIS — F32A Depression, unspecified: Secondary | ICD-10-CM | POA: Insufficient documentation

## 2022-11-14 DIAGNOSIS — C50919 Malignant neoplasm of unspecified site of unspecified female breast: Secondary | ICD-10-CM | POA: Diagnosis not present

## 2022-11-14 DIAGNOSIS — Z452 Encounter for adjustment and management of vascular access device: Secondary | ICD-10-CM | POA: Diagnosis not present

## 2022-11-14 DIAGNOSIS — M1990 Unspecified osteoarthritis, unspecified site: Secondary | ICD-10-CM | POA: Insufficient documentation

## 2022-11-14 DIAGNOSIS — Z01818 Encounter for other preprocedural examination: Secondary | ICD-10-CM

## 2022-11-14 DIAGNOSIS — M797 Fibromyalgia: Secondary | ICD-10-CM | POA: Diagnosis not present

## 2022-11-14 DIAGNOSIS — R519 Headache, unspecified: Secondary | ICD-10-CM | POA: Diagnosis not present

## 2022-11-14 DIAGNOSIS — C50912 Malignant neoplasm of unspecified site of left female breast: Secondary | ICD-10-CM | POA: Diagnosis not present

## 2022-11-14 DIAGNOSIS — Z170 Estrogen receptor positive status [ER+]: Secondary | ICD-10-CM | POA: Insufficient documentation

## 2022-11-14 DIAGNOSIS — Z5111 Encounter for antineoplastic chemotherapy: Secondary | ICD-10-CM | POA: Diagnosis not present

## 2022-11-14 HISTORY — PX: PORTACATH PLACEMENT: SHX2246

## 2022-11-14 SURGERY — INSERTION, TUNNELED CENTRAL VENOUS DEVICE, WITH PORT
Anesthesia: General | Site: Chest

## 2022-11-14 MED ORDER — PROPOFOL 10 MG/ML IV BOLUS
INTRAVENOUS | Status: DC | PRN
  Administered 2022-11-14: 180 mg via INTRAVENOUS

## 2022-11-14 MED ORDER — CHLORHEXIDINE GLUCONATE CLOTH 2 % EX PADS: 6.0000 | MEDICATED_PAD | Freq: Once | CUTANEOUS | Status: DC

## 2022-11-14 MED ORDER — CEFAZOLIN SODIUM-DEXTROSE 2-4 GM/100ML-% IV SOLN
2.0000 g | INTRAVENOUS | Status: AC
  Administered 2022-11-14: 2 g via INTRAVENOUS

## 2022-11-14 MED ORDER — LIDOCAINE HCL (CARDIAC) PF 100 MG/5ML IV SOSY
PREFILLED_SYRINGE | INTRAVENOUS | Status: DC | PRN
  Administered 2022-11-14: 50 mg via INTRAVENOUS

## 2022-11-14 MED ORDER — KETOROLAC TROMETHAMINE 30 MG/ML IJ SOLN
INTRAMUSCULAR | Status: AC
  Filled 2022-11-14: qty 1

## 2022-11-14 MED ORDER — ACETAMINOPHEN 500 MG PO TABS
ORAL_TABLET | ORAL | Status: AC
  Filled 2022-11-14: qty 2

## 2022-11-14 MED ORDER — ONDANSETRON HCL 4 MG/2ML IJ SOLN: 4.0000 mg | Freq: Once | INTRAMUSCULAR | Status: DC | PRN

## 2022-11-14 MED ORDER — CEFAZOLIN SODIUM-DEXTROSE 2-4 GM/100ML-% IV SOLN
INTRAVENOUS | Status: AC
  Filled 2022-11-14: qty 100

## 2022-11-14 MED ORDER — MIDAZOLAM HCL 5 MG/5ML IJ SOLN
INTRAMUSCULAR | Status: DC | PRN
  Administered 2022-11-14: 2 mg via INTRAVENOUS

## 2022-11-14 MED ORDER — DEXAMETHASONE SODIUM PHOSPHATE 10 MG/ML IJ SOLN
INTRAMUSCULAR | Status: AC
  Filled 2022-11-14: qty 1

## 2022-11-14 MED ORDER — PEGFILGRASTIM-CBQV 6 MG/0.6ML ~~LOC~~ SOSY
6.0000 mg | PREFILLED_SYRINGE | Freq: Once | SUBCUTANEOUS | Status: AC
  Administered 2022-11-14: 6 mg via SUBCUTANEOUS
  Filled 2022-11-14: qty 0.6

## 2022-11-14 MED ORDER — DEXAMETHASONE SODIUM PHOSPHATE 4 MG/ML IJ SOLN
INTRAMUSCULAR | Status: DC | PRN
  Administered 2022-11-14: 5 mg via INTRAVENOUS

## 2022-11-14 MED ORDER — EPHEDRINE SULFATE (PRESSORS) 50 MG/ML IJ SOLN
INTRAMUSCULAR | Status: DC | PRN
  Administered 2022-11-14: 10 mg via INTRAVENOUS

## 2022-11-14 MED ORDER — ACETAMINOPHEN 500 MG PO TABS
1000.0000 mg | ORAL_TABLET | Freq: Once | ORAL | Status: AC
  Administered 2022-11-14: 1000 mg via ORAL

## 2022-11-14 MED ORDER — OXYCODONE HCL 5 MG/5ML PO SOLN: 5.0000 mg | Freq: Once | ORAL | Status: DC | PRN

## 2022-11-14 MED ORDER — SCOPOLAMINE 1 MG/3DAYS TD PT72
MEDICATED_PATCH | TRANSDERMAL | Status: DC | PRN
  Administered 2022-11-14: 1 via TRANSDERMAL

## 2022-11-14 MED ORDER — FENTANYL CITRATE (PF) 100 MCG/2ML IJ SOLN
INTRAMUSCULAR | Status: AC
  Filled 2022-11-14: qty 2

## 2022-11-14 MED ORDER — ONDANSETRON HCL 4 MG/2ML IJ SOLN
INTRAMUSCULAR | Status: AC
  Filled 2022-11-14: qty 2

## 2022-11-14 MED ORDER — MIDAZOLAM HCL 2 MG/2ML IJ SOLN
INTRAMUSCULAR | Status: AC
  Filled 2022-11-14: qty 2

## 2022-11-14 MED ORDER — EPHEDRINE 5 MG/ML INJ
INTRAVENOUS | Status: AC
  Filled 2022-11-14: qty 5

## 2022-11-14 MED ORDER — HEPARIN SOD (PORK) LOCK FLUSH 100 UNIT/ML IV SOLN
INTRAVENOUS | Status: DC | PRN
  Administered 2022-11-14: 500 [IU] via INTRAVENOUS

## 2022-11-14 MED ORDER — ACETAMINOPHEN 500 MG PO TABS: 1000.0000 mg | ORAL_TABLET | ORAL | Status: AC

## 2022-11-14 MED ORDER — LACTATED RINGERS IV SOLN: INTRAVENOUS | Status: DC

## 2022-11-14 MED ORDER — HEPARIN SOD (PORK) LOCK FLUSH 100 UNIT/ML IV SOLN
INTRAVENOUS | Status: AC
  Filled 2022-11-14: qty 5

## 2022-11-14 MED ORDER — BUPIVACAINE HCL (PF) 0.25 % IJ SOLN
INTRAMUSCULAR | Status: DC | PRN
  Administered 2022-11-14: 7 mL

## 2022-11-14 MED ORDER — ROCURONIUM BROMIDE 10 MG/ML (PF) SYRINGE
PREFILLED_SYRINGE | INTRAVENOUS | Status: AC
  Filled 2022-11-14: qty 10

## 2022-11-14 MED ORDER — SUCCINYLCHOLINE CHLORIDE 200 MG/10ML IV SOSY
PREFILLED_SYRINGE | INTRAVENOUS | Status: DC | PRN
  Administered 2022-11-14: 160 mg via INTRAVENOUS

## 2022-11-14 MED ORDER — LIDOCAINE 2% (20 MG/ML) 5 ML SYRINGE
INTRAMUSCULAR | Status: AC
  Filled 2022-11-14: qty 5

## 2022-11-14 MED ORDER — OXYCODONE HCL 5 MG PO TABS: 5.0000 mg | ORAL_TABLET | Freq: Once | ORAL | Status: DC | PRN

## 2022-11-14 MED ORDER — FENTANYL CITRATE (PF) 100 MCG/2ML IJ SOLN: 25.0000 ug | INTRAMUSCULAR | Status: DC | PRN

## 2022-11-14 MED ORDER — HEPARIN (PORCINE) IN NACL 1000-0.9 UT/500ML-% IV SOLN
INTRAVENOUS | Status: AC
  Filled 2022-11-14: qty 500

## 2022-11-14 MED ORDER — BUPIVACAINE HCL (PF) 0.25 % IJ SOLN
INTRAMUSCULAR | Status: AC
  Filled 2022-11-14: qty 30

## 2022-11-14 MED ORDER — ONDANSETRON HCL 4 MG/2ML IJ SOLN
INTRAMUSCULAR | Status: DC | PRN
  Administered 2022-11-14: 4 mg via INTRAVENOUS

## 2022-11-14 MED ORDER — HEPARIN (PORCINE) IN NACL 2-0.9 UNITS/ML
INTRAMUSCULAR | Status: AC | PRN
  Administered 2022-11-14: 1 via INTRAVENOUS

## 2022-11-14 MED ORDER — FENTANYL CITRATE (PF) 100 MCG/2ML IJ SOLN
INTRAMUSCULAR | Status: DC | PRN
  Administered 2022-11-14 (×2): 50 ug via INTRAVENOUS

## 2022-11-14 SURGICAL SUPPLY — 49 items
ADH SKN CLS APL DERMABOND .7 (GAUZE/BANDAGES/DRESSINGS) ×1
APL PRP STRL LF DISP 70% ISPRP (MISCELLANEOUS) ×1
APL SKNCLS STERI-STRIP NONHPOA (GAUZE/BANDAGES/DRESSINGS) ×1
BAG DECANTER FOR FLEXI CONT (MISCELLANEOUS) ×2 IMPLANT
BENZOIN TINCTURE PRP APPL 2/3 (GAUZE/BANDAGES/DRESSINGS) ×2 IMPLANT
BLADE SURG 11 STRL SS (BLADE) ×2 IMPLANT
BLADE SURG 15 STRL LF DISP TIS (BLADE) ×2 IMPLANT
BLADE SURG 15 STRL SS (BLADE) ×1
CANISTER SUCT 1200ML W/VALVE (MISCELLANEOUS) IMPLANT
CHLORAPREP W/TINT 26 (MISCELLANEOUS) ×2 IMPLANT
COVER BACK TABLE 60X90IN (DRAPES) ×2 IMPLANT
COVER MAYO STAND STRL (DRAPES) ×2 IMPLANT
COVER PROBE 5X48 (MISCELLANEOUS)
DERMABOND ADVANCED .7 DNX12 (GAUZE/BANDAGES/DRESSINGS) ×2 IMPLANT
DRAPE C-ARM 42X72 X-RAY (DRAPES) ×2 IMPLANT
DRAPE LAPAROSCOPIC ABDOMINAL (DRAPES) ×2 IMPLANT
DRAPE UTILITY XL STRL (DRAPES) ×2 IMPLANT
DRSG TEGADERM 4X4.75 (GAUZE/BANDAGES/DRESSINGS) IMPLANT
ELECT COATED BLADE 2.86 ST (ELECTRODE) ×2 IMPLANT
ELECT REM PT RETURN 9FT ADLT (ELECTROSURGICAL) ×1
ELECTRODE REM PT RTRN 9FT ADLT (ELECTROSURGICAL) ×2 IMPLANT
GAUZE 4X4 16PLY ~~LOC~~+RFID DBL (SPONGE) ×2 IMPLANT
GAUZE SPONGE 4X4 12PLY STRL LF (GAUZE/BANDAGES/DRESSINGS) ×2 IMPLANT
GLOVE BIO SURGEON STRL SZ7 (GLOVE) ×2 IMPLANT
GLOVE BIOGEL PI IND STRL 7.5 (GLOVE) ×2 IMPLANT
GOWN STRL REUS W/ TWL LRG LVL3 (GOWN DISPOSABLE) ×4 IMPLANT
GOWN STRL REUS W/TWL LRG LVL3 (GOWN DISPOSABLE) ×2
IV KIT MINILOC 20X1 SAFETY (NEEDLE) IMPLANT
KIT CVR 48X5XPRB PLUP LF (MISCELLANEOUS) IMPLANT
KIT PORT POWER 8FR ISP CVUE (Port) IMPLANT
NDL HYPO 25X1 1.5 SAFETY (NEEDLE) ×2 IMPLANT
NDL SAFETY ECLIP 18X1.5 (MISCELLANEOUS) IMPLANT
NEEDLE HYPO 25X1 1.5 SAFETY (NEEDLE) ×1 IMPLANT
PACK BASIN DAY SURGERY FS (CUSTOM PROCEDURE TRAY) ×2 IMPLANT
PENCIL SMOKE EVACUATOR (MISCELLANEOUS) ×2 IMPLANT
SLEEVE SCD COMPRESS KNEE MED (STOCKING) ×2 IMPLANT
SPIKE FLUID TRANSFER (MISCELLANEOUS) IMPLANT
STRIP CLOSURE SKIN 1/2X4 (GAUZE/BANDAGES/DRESSINGS) ×2 IMPLANT
SUT MNCRL AB 4-0 PS2 18 (SUTURE) ×2 IMPLANT
SUT PROLENE 2 0 SH DA (SUTURE) ×2 IMPLANT
SUT SILK 2 0 TIES 17X18 (SUTURE)
SUT SILK 2-0 18XBRD TIE BLK (SUTURE) IMPLANT
SUT VIC AB 3-0 SH 27 (SUTURE) ×1
SUT VIC AB 3-0 SH 27X BRD (SUTURE) ×2 IMPLANT
SYR 5ML LUER SLIP (SYRINGE) ×2 IMPLANT
SYR CONTROL 10ML LL (SYRINGE) ×2 IMPLANT
TOWEL GREEN STERILE FF (TOWEL DISPOSABLE) ×2 IMPLANT
TUBE CONNECTING 20X1/4 (TUBING) IMPLANT
YANKAUER SUCT BULB TIP NO VENT (SUCTIONS) IMPLANT

## 2022-11-14 NOTE — Discharge Instructions (Addendum)
PORT-A-CATH: POST OP INSTRUCTIONS  Always review your discharge instruction sheet given to you by the facility where your surgery was performed.   A prescription for pain medication may be given to you upon discharge. Take your pain medication as prescribed, if needed. If narcotic pain medicine is not needed, then you make take acetaminophen (Tylenol) or ibuprofen (Advil) as needed.  Take your usually prescribed medications unless otherwise directed. If you need a refill on your pain medication, please contact our office. All narcotic pain medicine now requires a paper prescription.  Phoned in and fax refills are no longer allowed by law.  Prescriptions will not be filled after 5 pm or on weekends.  You should follow a light diet for the remainder of the day after your procedure. Most patients will experience some mild swelling and/or bruising in the area of the incision. It may take several days to resolve. It is common to experience some constipation if taking pain medication after surgery. Increasing fluid intake and taking a stool softener (such as Colace) will usually help or prevent this problem from occurring. A mild laxative (Milk of Magnesia or Miralax) should be taken according to package directions if there are no bowel movements after 48 hours.  Unless discharge instructions indicate otherwise, you may remove your bandages 48 hours after surgery, and you may shower at that time. You may have steri-strips (small white skin tapes) in place directly over the incision.  These strips should be left on the skin for 7-10 days.  If your surgeon used Dermabond (skin glue) on the incision, you may shower in 24 hours.  The glue will flake off over the next 2-3 weeks.  If your port is left accessed at the end of surgery (needle left in port), the dressing cannot get wet and should only by changed by a healthcare professional. When the port is no longer accessed (when the needle has been removed),  follow step 7.   ACTIVITIES:  Limit activity involving your arms for the next 72 hours. Do no strenuous exercise or activity for 1 week. You may drive when you are no longer taking prescription pain medication, you can comfortably wear a seatbelt, and you can maneuver your car. 10.You may need to see your doctor in the office for a follow-up appointment.  Please       check with your doctor.  11.When you receive a new Port-a-Cath, you will get a product guide and        ID card.  Please keep them in case you need them.  WHEN TO CALL YOUR DOCTOR [phone]): Fever over 101.0 Chills Continued bleeding from incision Increased redness and tenderness at the site Shortness of breath, difficulty breathing   The clinic staff is available to answer your questions during regular business hours. Please don't hesitate to call and ask to speak to one of the nurses or medical assistants for clinical concerns. If you have a medical emergency, go to the nearest emergency room or call 911.  A surgeon from [facility] is always on call at the hospital.     For further information, please visit [email or web address]     Post Anesthesia Home Care Instructions  Activity: Get plenty of rest for the remainder of the day. A responsible individual must stay with you for 24 hours following the procedure.  For the next 24 hours, DO NOT: -Drive a car -Paediatric nurse -Drink alcoholic beverages -Take any medication unless instructed by your  physician -Make any legal decisions or sign important papers.  Meals: Start with liquid foods such as gelatin or soup. Progress to regular foods as tolerated. Avoid greasy, spicy, heavy foods. If nausea and/or vomiting occur, drink only clear liquids until the nausea and/or vomiting subsides. Call your physician if vomiting continues.  Special Instructions/Symptoms: Your throat may feel dry or sore from the anesthesia or the breathing tube  placed in your throat during surgery. If this causes discomfort, gargle with warm salt water. The discomfort should disappear within 24 hours.  If you had a scopolamine patch placed behind your ear for the management of post- operative nausea and/or vomiting:  1. The medication in the patch is effective for 72 hours, after which it should be removed.  Wrap patch in a tissue and discard in the trash. Wash hands thoroughly with soap and water. 2. You may remove the patch earlier than 72 hours if you experience unpleasant side effects which may include dry mouth, dizziness or visual disturbances. 3. Avoid touching the patch. Wash your hands with soap and water after contact with the patch.  No tylenol until after 3:30pm today if needed.

## 2022-11-14 NOTE — Telephone Encounter (Signed)
Telephone call to patient for follow up after receiving first infusion.   No answer but left message stating we were calling to check on them.  Encouraged patient to call for any questions or concerns.   

## 2022-11-14 NOTE — Anesthesia Preprocedure Evaluation (Addendum)
Anesthesia Evaluation  Patient identified by MRN, date of birth, ID band Patient awake    Reviewed: Allergy & Precautions, NPO status , Patient's Chart, lab work & pertinent test results  History of Anesthesia Complications (+) PONV and history of anesthetic complications  Airway Mallampati: I  TM Distance: >3 FB Neck ROM: Full    Dental  (+) Dental Advisory Given, Teeth Intact   Pulmonary neg pulmonary ROS   Pulmonary exam normal        Cardiovascular Normal cardiovascular exam   Heart surgery at 56 yr old - PFO repair     Neuro/Psych  Headaches PSYCHIATRIC DISORDERS Anxiety Depression       GI/Hepatic Neg liver ROS,GERD  Medicated and Poorly Controlled,,  Endo/Other  negative endocrine ROS    Renal/GU negative Renal ROS     Musculoskeletal  (+) Arthritis ,  Fibromyalgia -  Abdominal   Peds  Hematology negative hematology ROS (+)   Anesthesia Other Findings   Reproductive/Obstetrics  Breast cancer                              Anesthesia Physical Anesthesia Plan  ASA: 2  Anesthesia Plan: General   Post-op Pain Management: Tylenol PO (pre-op)* and Minimal or no pain anticipated   Induction: Intravenous and Rapid sequence  PONV Risk Score and Plan: 4 or greater and Treatment may vary due to age or medical condition, Ondansetron and Dexamethasone  Airway Management Planned: Oral ETT  Additional Equipment: None  Intra-op Plan:   Post-operative Plan: Extubation in OR  Informed Consent: I have reviewed the patients History and Physical, chart, labs and discussed the procedure including the risks, benefits and alternatives for the proposed anesthesia with the patient or authorized representative who has indicated his/her understanding and acceptance.     Dental advisory given  Plan Discussed with: CRNA and Anesthesiologist  Anesthesia Plan Comments:        Anesthesia  Quick Evaluation

## 2022-11-14 NOTE — Transfer of Care (Signed)
Immediate Anesthesia Transfer of Care Note  Patient: Nichole Brown  Procedure(s) Performed: PORT PLACEMENT WITH ULTRASOUND GUIDANCE (Chest)  Patient Location: PACU  Anesthesia Type:General  Level of Consciousness: awake, alert , and oriented  Airway & Oxygen Therapy: Patient Spontanous Breathing and Patient connected to face mask oxygen  Post-op Assessment: Report given to RN and Post -op Vital signs reviewed and stable  Post vital signs: Reviewed and stable  Last Vitals:  Vitals Value Taken Time  BP 132/80 11/14/22 1128  Temp    Pulse 90 11/14/22 1129  Resp 16 11/14/22 1129  SpO2 100 % 11/14/22 1129  Vitals shown include unvalidated device data.  Last Pain:  Vitals:   11/14/22 0915  TempSrc: Oral  PainSc: 0-No pain         Complications: No notable events documented.

## 2022-11-14 NOTE — Anesthesia Procedure Notes (Addendum)
Procedure Name: Intubation Date/Time: 11/14/2022 10:47 AM  Performed by: Bufford Spikes, CRNAPre-anesthesia Checklist: Patient identified, Emergency Drugs available, Suction available and Patient being monitored Patient Re-evaluated:Patient Re-evaluated prior to induction Oxygen Delivery Method: Circle system utilized Preoxygenation: Pre-oxygenation with 100% oxygen Induction Type: IV induction, Rapid sequence and Cricoid Pressure applied Ventilation: Mask ventilation without difficulty Laryngoscope Size: Miller and 2 Grade View: Grade II Tube type: Oral Tube size: 7.0 mm Number of attempts: 1 Airway Equipment and Method: Stylet Placement Confirmation: ETT inserted through vocal cords under direct vision, positive ETCO2 and breath sounds checked- equal and bilateral Secured at: 21 cm Tube secured with: Tape Dental Injury: Teeth and Oropharynx as per pre-operative assessment

## 2022-11-14 NOTE — Telephone Encounter (Signed)
Spoke to [employer] and she said that stop decadron and compazine and let pt take zofran. Pt was at [facility] in [place] to get port insertion and I called the pt and left message and then I called again to see if she was at home yet and she was at home and I spoke to her about above and she agreed that the zofran and she has used it in the past for migraines and she feels it will work for her. She is to call back if she has issues going forward

## 2022-11-14 NOTE — Op Note (Signed)
Preoperative diagnosis: breast cancer, needs venous access for chemotherapy Postoperative diagnosis: Same as above Procedure: Right internal jugular port placement  Surgeon: Dr. Serita Grammes Anesthesia: General Estimated blood loss: Minimal Complications: None Drains: None Specimens: None Sponge and needle count was correct at completion Disposition recovery stable condition.     Indications: 54 yof s/p lumpectomy and sn biopsy with oncotype of 28.  Needs venous access for chemotherapy.   Procedure: After informed consent was obtained the patient was taken the operating room.  She was given antibiotics.  SCDs were placed.  She was then placed under general anesthesia without complication.  She was prepped and draped in a sterile sterile surgical fashion.  Surgical timeout was then performed.  I located her internal jugular vein on the right side.  I then made a small nick in the skin of her neck.  I accessed the vein easily.  I then passed the wire.  I confirmed this to be in position using the ultrasound and it was in good position on fluoroscopy.  I then infiltrated Marcaine below her clavicle.  I made an incision and developed a pocket.  I tunneled the line between the 2 sites.  I placed the dilator.  This was in good position I then removed the wire assembly.  I placed the line through the peel-away sheath and removed the sheath.  The line was pulled back to be in the distal cava near the cavoatrial junction.  I then hooked this up to the port.  I sutured the port into place with 2-0 Prolene suture.  The port aspirated blood and flushed easily.  I packed it with heparin.  I then did 1 final x-ray to confirm it was all in good position and this looked good.  I then closed this with 3-0 Vicryl and 4-0 Monocryl.  Glue was placed.  she tolerated well, was extubated and transferred to recovery stable.

## 2022-11-14 NOTE — H&P (Signed)
Nichole Brown is an 56 y.o. female.   Chief Complaint: breast cancer HPI: 56 year old female status post left lumpectomy and sentinel node biopsy. She is doing well without any complaints. 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 Margins are negative. She has 1 sentinel node that is negative. This was ER positive at greater than 90%, PR positive at 31 to 40%, and HER2 negative. She has undergone Oncotype testing which shows a result of 28. Has elected to undergo chemotherapy.    Past Medical History:  Diagnosis Date   Anxiety    Bronchitis    Cancer ([place]) 09/2022   left breast IMC   Complication of anesthesia    PONV   Depression    GERD (gastroesophageal reflux disease)    Insomnia    Migraine headache    Pneumonia    2000   PONV (postoperative nausea and vomiting)     Past Surgical History:  Procedure Laterality Date   ABDOMINAL HYSTERECTOMY     BALLOON DILATION N/A 12/14/2020   Procedure: BALLOON DILATION;  Surgeon: Eloise Harman, DO;  [facility];  Service: Endoscopy;  Laterality: N/A;   BIOPSY  12/14/2020   Procedure: BIOPSY;  Surgeon: Eloise Harman, DO;  [facility];  Service: Endoscopy;;   BREAST BIOPSY Left 08/24/2022   [place] bx, coil marker path pending   BREAST LUMPECTOMY WITH RADIOACTIVE SEED AND SENTINEL LYMPH NODE BIOPSY Left 09/29/2022   Procedure: LEFT BREAST LUMPECTOMY WITH RADIOACTIVE SEED AND AXILLARY SENTINEL LYMPH NODE BIOPSY;  Surgeon: Rolm Bookbinder, MD;  Location: [facility];  Service: General;  Laterality: Left;   CESAREAN SECTION     CHOLECYSTECTOMY  1988   COLONOSCOPY N/A 12/10/2018    six 4-8 mm polyps in proximal descending colon, mid transverse and hepatic flexure. Diverticulosis. External and internal hemorrhoids. Torturous colon. Two simple adenomas, two serrated polyps, 2 hyperplastic polyps. Surveillance due Dec 2022.    ESOPHAGOGASTRODUODENOSCOPY (EGD) WITH  PROPOFOL N/A 12/14/2020   Benign-appearing esophageal stenosis s/p dilation, gastritis, normal duodenum. Mild chronic gastritis.    open heart surgery  1967   PATENT DUCTUS ARTERIOUS REPAIR  1967   POLYPECTOMY  12/10/2018   Procedure: POLYPECTOMY;  Surgeon: Danie Binder, MD;  [facility]: [facility];  Service: Endoscopy;;  hepatic flexure, descending, transverse   SHOULDER ARTHROSCOPY Left 06/16/2017   Procedure: LEFT SHOULDER ARTHROSCOPY, DEBRIDEMENT, AND DECOMPRESSION;  Surgeon: Newt Minion, MD;  Location: [facility];  Service: Orthopedics;  Laterality: Left;   SHOULDER ARTHROSCOPY WITH SUBACROMIAL DECOMPRESSION Left 10/03/2019   Procedure: SHOULDER ARTHROSCOPY WITH DEBRIDEMENT, DECOMPRESSION;  Surgeon: Corky Mull, MD;  Location: [facility];  Service: Orthopedics;  Laterality: Left;   SHOULDER SURGERY Left 2008   AC joint    Family History  Problem Relation Age of Onset   Breast cancer Mother 108   Hypertension Father    Colon cancer Paternal Uncle        dx 43s   Hypertension Maternal Grandmother    Diabetes Maternal Grandmother    Heart disease Maternal Grandmother 41       MI   Hypertension Maternal Grandfather    Throat cancer Maternal Grandfather 22   Social History:  reports that she has never smoked. She has never used smokeless tobacco. She reports current alcohol use. She reports that she does not use drugs.  Allergies:  Allergies  Allergen Reactions   Cymbalta [Duloxetine Hcl]  Other (See Comments)    Hallucinations; sleep walking   Nortriptyline Nausea And Vomiting   Phenergan [Promethazine Hcl]     Restless legs and cramping    Medications Prior to Admission  Medication Sig Dispense Refill   acetaminophen (TYLENOL) 500 MG tablet Take 500 mg by mouth every 6 (six) hours as needed for moderate pain.     CALCIUM PO Take 1 capsule by mouth daily.     celecoxib (CELEBREX) 200 MG capsule Take 1 capsule (200 mg total) by mouth 2 (two) times daily 60 capsule 2    Cholecalciferol (VITAMIN D3 PO) Take 1 capsule by mouth daily.     dexamethasone (DECADRON) 4 MG tablet Take 2 tabs by mouth 2 times daily starting day before chemo. Then take 2 tabs daily for 2 days starting day after chemo. Take with food. 30 tablet 1   famotidine (PEPCID) 20 MG tablet Take 20 mg by mouth 2 (two) times daily.     Lifitegrast (XIIDRA) 5 % SOLN Instill 1 drop into both eyes twice a day 180 each 4   ondansetron (ZOFRAN-ODT) 4 MG disintegrating tablet Take 1 tablet (4 mg total) by mouth every 8 (eight) hours as needed for Nausea migraine 20 tablet 11   Probiotic Product (PROBIOTIC PO) Take 1 capsule by mouth daily.     prochlorperazine (COMPAZINE) 10 MG tablet Take 1 tablet (10 mg total) by mouth every 6 (six) hours as needed for nausea or vomiting. 30 tablet 1   topiramate (TOPAMAX) 25 MG tablet Take 1 tablet (25 mg total) by mouth 2 (two) times daily 60 tablet 11   fluticasone (FLONASE) 50 MCG/ACT nasal spray Place 1 spray into both nostrils daily as needed for allergies or rhinitis.     lidocaine-prilocaine (EMLA) cream Apply to affected area once 30 g 3   rizatriptan (MAXALT-MLT) 10 MG disintegrating tablet Take 1 tablet (10 mg total) by mouth once as needed (may repeat dose once in > 2 hrs) for up to 1 dose 10 tablet 0   rizatriptan (MAXALT-MLT) 10 MG disintegrating tablet Take 1 tablet (10 mg total) by mouth once as needed (may repeat dose once in > 2 hrs) for up to 1 dose 10 tablet 11   tiZANidine (ZANAFLEX) 4 MG tablet Take 1 tablet (4 mg total) by mouth 3 (three) times daily 60 tablet 1   traMADol (ULTRAM) 50 MG tablet Take 1 tablet (50 mg total) by mouth every 6 (six) hours as needed. (Patient not taking: Reported on 11/09/2022) 10 tablet 0    No results found for this or any previous visit (from the past 48 hour(s)). No results found.  Review of Systems  All other systems reviewed and are negative.   Height _0  (1.575 m), weight 73.5 kg. Physical Exam   General  nad Cv regular  Pulm effort normal Left breast incision and left axillary incision healing well without infection  Assessment/Plan  Port placement    Rolm Bookbinder, MD 11/14/2022, 9:13 AM

## 2022-11-14 NOTE — Progress Notes (Signed)
Received a voicemail from Nichole Brown asking about her injection after her next chemo since the cancer center is closed on 12/25.   I returned her call and had to leave a voicemail to let her know that Nichole Brown has been scheduled for 12/26.  Also let her know that she can hold off on compazine and decadron since she is having anxiety and indigestion from them and see if just zofran will control her nausea, if not Dr. Janese Banks said she could order another medication for her.

## 2022-11-15 ENCOUNTER — Ambulatory Visit: Payer: 59

## 2022-11-15 ENCOUNTER — Encounter (HOSPITAL_BASED_OUTPATIENT_CLINIC_OR_DEPARTMENT_OTHER): Payer: Self-pay | Admitting: General Surgery

## 2022-11-15 ENCOUNTER — Encounter: Payer: Self-pay | Admitting: *Deleted

## 2022-11-15 NOTE — Progress Notes (Signed)
Nichole Brown called with a couple questions related to her udenyca injection.  Her questions answered to her satisfaction.  She is doing well since having her port placed yesterday.  Zofran is controlling her nausea

## 2022-11-15 NOTE — Anesthesia Postprocedure Evaluation (Signed)
Anesthesia Post Note  Patient: Nichole Brown  Procedure(s) Performed: PORT PLACEMENT WITH ULTRASOUND GUIDANCE (Chest)     Patient location during evaluation: PACU Anesthesia Type: General Level of consciousness: awake and alert Pain management: pain level controlled Vital Signs Assessment: post-procedure vital signs reviewed and stable Respiratory status: spontaneous breathing, nonlabored ventilation and respiratory function stable Cardiovascular status: stable and blood pressure returned to baseline Anesthetic complications: no   No notable events documented.  Last Vitals:  Vitals:   11/14/22 1215 11/14/22 1245  BP: 116/75 123/78  Pulse: 66 69  Resp: 14 16  Temp:  36.6 C  SpO2: 96% 99%    Last Pain:  Vitals:   11/14/22 1245  TempSrc:   PainSc: [name]

## 2022-11-16 ENCOUNTER — Ambulatory Visit: Payer: 59 | Admitting: Family Medicine

## 2022-11-16 ENCOUNTER — Encounter: Payer: Self-pay | Admitting: Medical Oncology

## 2022-11-16 ENCOUNTER — Encounter: Payer: Self-pay | Admitting: *Deleted

## 2022-11-16 ENCOUNTER — Other Ambulatory Visit: Payer: Self-pay

## 2022-11-16 ENCOUNTER — Inpatient Hospital Stay (HOSPITAL_BASED_OUTPATIENT_CLINIC_OR_DEPARTMENT_OTHER): Payer: 59 | Admitting: Medical Oncology

## 2022-11-16 VITALS — BP 102/71 | HR 90 | Temp 99.7°F | Resp 20 | Ht 62.0 in | Wt 165.0 lb

## 2022-11-16 DIAGNOSIS — L282 Other prurigo: Secondary | ICD-10-CM

## 2022-11-16 DIAGNOSIS — Z79899 Other long term (current) drug therapy: Secondary | ICD-10-CM | POA: Diagnosis not present

## 2022-11-16 DIAGNOSIS — G4700 Insomnia, unspecified: Secondary | ICD-10-CM | POA: Diagnosis not present

## 2022-11-16 DIAGNOSIS — Z791 Long term (current) use of non-steroidal anti-inflammatories (NSAID): Secondary | ICD-10-CM | POA: Diagnosis not present

## 2022-11-16 DIAGNOSIS — C50412 Malignant neoplasm of upper-outer quadrant of left female breast: Secondary | ICD-10-CM | POA: Diagnosis not present

## 2022-11-16 DIAGNOSIS — Z5189 Encounter for other specified aftercare: Secondary | ICD-10-CM | POA: Diagnosis not present

## 2022-11-16 DIAGNOSIS — Z170 Estrogen receptor positive status [ER+]: Secondary | ICD-10-CM | POA: Diagnosis not present

## 2022-11-16 DIAGNOSIS — G629 Polyneuropathy, unspecified: Secondary | ICD-10-CM | POA: Diagnosis not present

## 2022-11-16 DIAGNOSIS — K219 Gastro-esophageal reflux disease without esophagitis: Secondary | ICD-10-CM | POA: Diagnosis not present

## 2022-11-16 DIAGNOSIS — Z5111 Encounter for antineoplastic chemotherapy: Secondary | ICD-10-CM | POA: Diagnosis not present

## 2022-11-16 MED ORDER — NYSTATIN 100000 UNIT/ML MT SUSP: 5.0000 mL | Freq: Four times a day (QID) | OROMUCOSAL | 0 refills | Status: DC

## 2022-11-16 MED ORDER — FLUCONAZOLE 200 MG PO TABS: 200.0000 mg | ORAL_TABLET | Freq: Every day | ORAL | 0 refills | Status: AC

## 2022-11-16 NOTE — Progress Notes (Unsigned)
Patient called this morning with complaints of a white coating on her tongue.  She also thinks she may be getting yeast infection around her rectum.  Per Dr. Janese Banks Nystatin mouthwash being sent in.  She will see [facility] this afternoon for possible yeast infection.

## 2022-11-16 NOTE — Progress Notes (Signed)
 Symptom Management Clinic [facility] [phone] [phone]  Patient Care Team: Lindley, Cheryl P, FNP as PCP - General (Family Medicine) Fields, Sandi L, MD (Inactive) as Consulting Physician (Gastroenterology) Moore, Ana K, RN as Oncology Nurse Navigator   Name of the patient: Nichole Brown  [id number]  12/09/1966   Date of visit: 11/16/22  Reason for Consult: Nichole Brown is a 56 y.o. female with stage 1a breast cancer who is s/p cycle 1 Taxotere/Cytoxan chemotherapy on(11/14/2022 who presents today for:  Rectal rash: Patient reports that over the past 2 days she has had an itchy and burning rash of her rectal area. The area feels a bit damp. She reports no new products, no scented items in this area and no bleeding. No fevers. She has tried a epsum salt bath which did not seem to help. Of note she also has the beginning of what she believes is oral thrush since this morning and was prescribed oral nystatin by Dr. Rao. She has not started this yet.   Denies any neurologic complaints. Denies recent fevers or illnesses. Denies any easy bleeding or bruising. Reports good appetite and denies weight loss. Denies chest pain. Denies any nausea, vomiting, constipation, or diarrhea. Denies urinary complaints. Patient offers no further specific complaints today.   PAST MEDICAL HISTORY: Past Medical History:  Diagnosis Date   Anxiety    Bronchitis    Cancer (HCC) 09/2022   left breast IMC   Complication of anesthesia    PONV   Depression    GERD (gastroesophageal reflux disease)    Insomnia    Migraine headache    Pneumonia    2000   PONV (postoperative nausea and vomiting)     PAST SURGICAL HISTORY:  Past Surgical History:  Procedure Laterality Date   ABDOMINAL HYSTERECTOMY     BALLOON DILATION N/A 12/14/2020   Procedure: BALLOON DILATION;  Surgeon: Carver, Charles K, DO;  [facility];  Service:  Endoscopy;  Laterality: N/A;   BIOPSY  12/14/2020   Procedure: BIOPSY;  Surgeon: Carver, Charles K, DO;  [facility];  Service: Endoscopy;;   BREAST BIOPSY Left 08/24/2022   us bx, coil marker path pending   BREAST LUMPECTOMY WITH RADIOACTIVE SEED AND SENTINEL LYMPH NODE BIOPSY Left 09/29/2022   Procedure: LEFT BREAST LUMPECTOMY WITH RADIOACTIVE SEED AND AXILLARY SENTINEL LYMPH NODE BIOPSY;  Surgeon: Wakefield, Matthew, MD;  Location: [facility];  Service: General;  Laterality: Left;   CESAREAN SECTION     CHOLECYSTECTOMY  1988   COLONOSCOPY N/A 12/10/2018    six 4-8 mm polyps in proximal descending colon, mid transverse and hepatic flexure. Diverticulosis. External and internal hemorrhoids. Torturous colon. Two simple adenomas, two serrated polyps, 2 hyperplastic polyps. Surveillance due Dec 2022.    ESOPHAGOGASTRODUODENOSCOPY (EGD) WITH PROPOFOL N/A 12/14/2020   Benign-appearing esophageal stenosis s/p dilation, gastritis, normal duodenum. Mild chronic gastritis.    open heart surgery  1967   PATENT DUCTUS ARTERIOUS REPAIR  1967   POLYPECTOMY  12/10/2018   Procedure: POLYPECTOMY;  Surgeon: Fields, Sandi L, MD;  [facility]: [facility];  Service: Endoscopy;;  hepatic flexure, descending, transverse   PORTACATH PLACEMENT N/A 11/14/2022   Procedure: PORT PLACEMENT WITH ULTRASOUND GUIDANCE;  Surgeon: Wakefield, Matthew, MD;  Location: [facility];  Service: General;  Laterality: N/A;   SHOULDER ARTHROSCOPY Left 06/16/2017   Procedure: LEFT SHOULDER ARTHROSCOPY, DEBRIDEMENT, AND DECOMPRESSION;  Surgeon: Duda, Marcus V,   MD;  Location: [facility];  Service: Orthopedics;  Laterality: Left;   SHOULDER ARTHROSCOPY WITH SUBACROMIAL DECOMPRESSION Left 10/03/2019   Procedure: SHOULDER ARTHROSCOPY WITH DEBRIDEMENT, DECOMPRESSION;  Surgeon: Poggi, John J, MD;  Location: [facility];  Service: Orthopedics;  Laterality: Left;   SHOULDER SURGERY Left 2008   AC joint     HEMATOLOGY/ONCOLOGY HISTORY:  Oncology History  Malignant neoplasm of upper-outer quadrant of left breast in female, estrogen receptor positive (HCC)  08/29/2022 Initial Diagnosis   Malignant neoplasm of upper-outer quadrant of left breast in female, estrogen receptor positive (HCC)   08/29/2022 Cancer Staging   Staging form: Breast, AJCC 8th Edition - Clinical stage from 08/29/2022: Stage IA (cT1b, cN0, cM0, G1, ER+, PR+, HER2-) - Signed by Rao, Archana C, MD on 08/29/2022 Stage prefix: Initial diagnosis Histologic grading system: 3 grade system    Genetic Testing   Negative genetic testing. No pathogenic variants identified on the [facility] CancerNext-Expanded+RNA panel. The report date is 09/16/2022.  The CancerNext-Expanded + RNAinsight gene panel offered by [facility] [employer] and includes sequencing and rearrangement analysis for the following 77 genes: IP, ALK, APC*, ATM*, AXIN2, BAP1, BARD1, BLM, BMPR1A, BRCA1*, BRCA2*, BRIP1*, CDC73, CDH1*,CDK4, CDKN1B, CDKN2A, CHEK2*, CTNNA1, DICER1, FANCC, FH, FLCN, GALNT12, KIF1B, LZTR1, MAX, MEN1, MET, MLH1*, MSH2*, MSH3, MSH6*, MUTYH*, NBN, NF1*, NF2, NTHL1, PALB2*, PHOX2B, PMS2*, POT1, PRKAR1A, PTCH1, PTEN*, RAD51C*, RAD51D*,RB1, RECQL, RET, SDHA, SDHAF2, SDHB, SDHC, SDHD, SMAD4, SMARCA4, SMARCB1, SMARCE1, STK11, SUFU, TMEM127, TP53*,TSC1, TSC2, VHL and XRCC2 (sequencing and deletion/duplication); EGFR, EGLN1, HOXB13, KIT, MITF, PDGFRA, POLD1 and POLE (sequencing only); EPCAM and GREM1 (deletion/duplication only).   10/07/2022 Cancer Staging   Staging form: Breast, AJCC 8th Edition - Pathologic stage from 10/07/2022: Stage IA (pT1b, pN0, cM0, G2, ER+, PR+, HER2-) - Signed by Rao, Archana C, MD on 10/07/2022 Multigene prognostic tests performed: Oncotype DX Histologic grading system: 3 grade system   11/11/2022 -  Chemotherapy   Patient is on Treatment Plan : BREAST TC q21d       ALLERGIES:  is allergic to cymbalta [duloxetine hcl], nortriptyline,  and phenergan [promethazine hcl].  MEDICATIONS:  Current Outpatient Medications  Medication Sig Dispense Refill   acetaminophen (TYLENOL) 500 MG tablet Take 500 mg by mouth every 6 (six) hours as needed for moderate pain.     CALCIUM PO Take 1 capsule by mouth daily.     celecoxib (CELEBREX) 200 MG capsule Take 1 capsule (200 mg total) by mouth 2 (two) times daily 60 capsule 2   Cholecalciferol (VITAMIN D3 PO) Take 1 capsule by mouth daily.     dexamethasone (DECADRON) 4 MG tablet Take 2 tabs by mouth 2 times daily starting day before chemo. Then take 2 tabs daily for 2 days starting day after chemo. Take with food. 30 tablet 1   famotidine (PEPCID) 20 MG tablet Take 20 mg by mouth 2 (two) times daily.     fluconazole (DIFLUCAN) 200 MG tablet Take 1 tablet (200 mg total) by mouth daily for 7 days. 7 tablet 0   fluticasone (FLONASE) 50 MCG/ACT nasal spray Place 1 spray into both nostrils daily as needed for allergies or rhinitis.     lidocaine-prilocaine (EMLA) cream Apply to affected area once 30 g 3   Lifitegrast (XIIDRA) 5 % SOLN Instill 1 drop into both eyes twice a day 180 each 4   ondansetron (ZOFRAN-ODT) 4 MG disintegrating tablet Take 1 tablet (4 mg total) by mouth every 8 (eight) hours as needed for Nausea migraine 20 tablet   11   Probiotic Product (PROBIOTIC PO) Take 1 capsule by mouth daily.     prochlorperazine (COMPAZINE) 10 MG tablet Take 1 tablet (10 mg total) by mouth every 6 (six) hours as needed for nausea or vomiting. 30 tablet 1   rizatriptan (MAXALT-MLT) 10 MG disintegrating tablet Take 1 tablet (10 mg total) by mouth once as needed (may repeat dose once in > 2 hrs) for up to 1 dose 10 tablet 0   rizatriptan (MAXALT-MLT) 10 MG disintegrating tablet Take 1 tablet (10 mg total) by mouth once as needed (may repeat dose once in > 2 hrs) for up to 1 dose 10 tablet 11   tiZANidine (ZANAFLEX) 4 MG tablet Take 1 tablet (4 mg total) by mouth 3 (three) times daily 60 tablet 1    topiramate (TOPAMAX) 25 MG tablet Take 1 tablet (25 mg total) by mouth 2 (two) times daily 60 tablet 11   nystatin (MYCOSTATIN) 100000 UNIT/ML suspension Take 5 mLs (500,000 Units total) by mouth 4 (four) times daily. Swish and spit (Patient not taking: Reported on 11/16/2022) 120 mL 0   traMADol (ULTRAM) 50 MG tablet Take 1 tablet (50 mg total) by mouth every 6 (six) hours as needed. (Patient not taking: Reported on 11/09/2022) 10 tablet 0   No current facility-administered medications for this visit.    VITAL SIGNS: BP 102/71   Pulse 90   Temp 99.7 F (37.6 C) (Tympanic)   Resp 20   Ht 5' 2" (1.575 m)   Wt 165 lb (74.8 kg)   BMI 30.18 kg/m  Filed Weights   11/16/22 1408  Weight: 165 lb (74.8 kg)    Estimated body mass index is 30.18 kg/m as calculated from the following:   Height as of this encounter: 5' 2" (1.575 m).   Weight as of this encounter: 165 lb (74.8 kg).  LABS: CBC:    Component Value Date/Time   WBC 8.6 11/11/2022 0836   HGB 14.3 11/11/2022 0836   HGB 12.9 09/22/2014 0615   HCT 42.6 11/11/2022 0836   HCT 40.7 09/22/2014 0615   PLT 278 11/11/2022 0836   PLT 223 09/22/2014 0615   MCV 88.0 11/11/2022 0836   MCV 90 09/22/2014 0615   NEUTROABS 4.0 11/11/2022 0836   NEUTROABS 3.2 09/22/2014 0615   LYMPHSABS 3.5 11/11/2022 0836   LYMPHSABS 3.6 09/22/2014 0615   MONOABS 0.7 11/11/2022 0836   MONOABS 0.8 09/22/2014 0615   EOSABS 0.4 11/11/2022 0836   EOSABS 0.4 09/22/2014 0615   BASOSABS 0.1 11/11/2022 0836   BASOSABS 0.1 09/22/2014 0615   Comprehensive Metabolic Panel:    Component Value Date/Time   NA 140 11/11/2022 0836   NA 143 09/22/2014 0615   K 3.7 11/11/2022 0836   K 4.0 09/22/2014 0615   CL 103 11/11/2022 0836   CL 108 (H) 09/22/2014 0615   CO2 26 11/11/2022 0836   CO2 28 09/22/2014 0615   BUN 14 11/11/2022 0836   BUN 12 09/22/2014 0615   CREATININE 0.61 11/11/2022 0836   CREATININE 0.65 09/22/2014 0615   GLUCOSE 121 (H) 11/11/2022 0836    GLUCOSE 84 09/22/2014 0615   CALCIUM 9.4 11/11/2022 0836   CALCIUM 8.4 (L) 09/22/2014 0615   AST 25 11/11/2022 0836   AST 26 09/22/2014 0615   ALT 42 11/11/2022 0836   ALT 36 09/22/2014 0615   ALKPHOS 87 11/11/2022 0836   ALKPHOS 83 09/22/2014 0615   BILITOT 0.4 11/11/2022 0836   BILITOT 0.2   09/22/2014 0615   PROT 7.1 11/11/2022 0836   PROT 6.7 09/22/2014 0615   ALBUMIN 4.1 11/11/2022 0836   ALBUMIN 3.5 09/22/2014 0615    RADIOGRAPHIC STUDIES: DG C-Arm 1-60 Min  Result Date: 11/15/2022 CLINICAL DATA:  Surgery, port placement. EXAM: DG C-ARM 1-60 MIN CONTRAST:  None. FLUOROSCOPY: Fluoroscopy Time:  0.14 seconds Radiation Exposure Index (if provided by the fluoroscopic device): 1.6 mGy Number of Acquired Spot Images: 1 COMPARISON:  None Available. FINDINGS: A right chest port is noted terminating over the superior vena cava. An endotracheal tube is present. IMPRESSION: Intraoperative utilization of fluoroscopy. Electronically Signed   By: Brett Fairy M.D.   On: 11/15/2022 04:18   DG Bone Density  Result Date: 10/20/2022 EXAM: DUAL X-RAY ABSORPTIOMETRY (DXA) FOR BONE MINERAL DENSITY IMPRESSION: Your patient Naiara Lombardozzi completed a BMD test on 10/20/2022 using the Cecilia (analysis version: 14.10) manufactured by [employer]. The following summarizes the results of our evaluation. Technologist: LCE PATIENT BIOGRAPHICAL: Name: Rhenda, [place] Patient ID: [id number] Birth Date: 1966-08-28 Height: 64.0 in. Gender: Female Exam Date: 10/20/2022 Weight: 166.6 lbs. Indications: Caucasian, Early Menopause, Hysterectomy, Oophorectomy Bilateral, POSTmenopausal Fractures: Treatments: Calcium, Vitamin D ASSESSMENT: The BMD measured at AP Spine L1-L4 is 0.989 g/cm2 with a T-score of -1.6. This patient is considered osteopenic according to [facility]) criteria. The scan quality is good. Site Region Measured Measured WHO Young Adult BMD Date       Age       Classification T-score AP Spine L1-L4 10/20/2020 58.8 Osteopenia -1.6 0.989 g/cm2 DualFemur Neck Left 10/20/2020 58.8 Osteopenia -1.5 0.836 g/cm2 World Health Organization [facility]) criteria for post-menopausal, Caucasian Women: Normal:       T-score at or above -1 SD Osteopenia:   T-score between -1 and -2.5 SD Osteoporosis: T-score at or below -2.5 SD RECOMMENDATIONS: 1. All patients should optimize calcium and vitamin D intake. 2. Consider FDA-approved medical therapies in postmenopausal women and men aged 49 years and older, based on the following: a. A hip or vertebral (clinical or morphometric) fracture b. T-score < -2.5 at the femoral neck or spine after appropriate evaluation to exclude secondary causes c. Low bone mass (T-score between -1.0 and -2.5 at the femoral neck or spine) and a 10-year probability of a hip fracture > 3% or a 10-year probability of a major osteoporosis-related fracture > 20% based on the [place]-adapted WHO algorithm d. Clinician judgment and/or patient preferences may indicate treatment for people with 10-year fracture probabilities above or below these levels FOLLOW-UP: People with diagnosed cases of osteoporosis or at high risk for fracture should have regular bone mineral density tests. For patients eligible for Medicare, routine testing is allowed once every 2 years. The testing frequency can be increased to one year for patients who have rapidly progressing disease, those who are receiving or discontinuing medical therapy to restore bone mass, or have additional risk factors. I have reviewed this report, and agree with the above findings. Mark A. Thornton Papas, M.D. [facility] Radiology Your patient GIAVONNI CIZEK completed a FRAX assessment on 10/20/2022 using the Marble (analysis version: 14.10) manufactured by [employer]. The following summarizes the results of our evaluation. Technologist: LCE PATIENT BIOGRAPHICAL: Name: Hibo, [place] Patient ID: [id number]  Birth Date: 26-Jun-1966 Height:    64.0 in. Gender:     Female    Age:        43.8       Weight:  166.6 lbs. Ethnicity:  White                            Exam Date: 10/20/2022 FRAX* RESULTS:  (version: 3.5) 10-year Probability of Fracture1 Major Osteoporotic Fracture2 Hip Fracture 7.5% 0.6% Population: [place] (Caucasian) Risk Factors: None Based on Femur (Left) Neck BMD 1 -The 10-year probability of fracture may be lower than reported if the patient has received treatment. 2 -Major Osteoporotic Fracture: Clinical Spine, Forearm, Hip or Shoulder *FRAX is a Materials engineer of the [employer] for Metabolic Bone Disease, a [place] (WHO) [facility]. ASSESSMENT: The probability of a major osteoporotic fracture is 7.5% within the next ten years. The probability of a hip fracture is 0.6% within the next ten years. I have reviewed this report and agree with the above findings. Mark A. Thornton Papas, M.D. [facility] Radiology Electronically Signed   By: Lavonia Dana M.D.   On: 10/20/2022 15:38    PERFORMANCE STATUS (ECOG) : 1 - Symptomatic but completely ambulatory  Review of Systems Unless otherwise noted, a complete review of systems is negative.  Physical Exam General: NAD Cardiovascular: regular rate and rhythm Pulmonary: clear ant fields Abdomen: soft, nontender, + bowel sounds GU: no suprapubic tenderness Extremities: no edema, no joint deformities Skin: see below  Neurological: Weakness but otherwise nonfocal   Assessment and Plan- Patient is a 56 y.o. female    Encounter Diagnoses  Name Primary?   Pruritic rash Yes   On antineoplastic chemotherapy    New. Likely fungal in nature given symptoms, the appearance of the rash and her being immunocompromised. As she is S/p her first cycle of chemotherapy I did run the case and this picture by Dr. Janese Banks to confirm that she did not believe this to be a more significant side effect that would potentially  mean chemotherapy adjustment. She agreed that this appeared fungal. Initially considered double covering with doxycyline however per Dr. Janese Banks she would recommend we start with oral fungal coverage first. Since we are treating this with oral medication she will hold off on taking her nystatin as the diflucan will cover for her thrush as well. Pt aware and agreeable. Discussed red flag signs and symptoms. She will call or mychart update [place] in 2-3 days on the status of her rash- soon should it worsen. She will avoid any products on this area and also avoid wiping the area (patting instead).    Patient expressed understanding and was in agreement with this plan. She also understands that She can call clinic at any time with any questions, concerns, or complaints.   Thank you for allowing me to participate in the care of this very pleasant patient.   Time Total: 15  Visit consisted of counseling and education dealing with the complex and emotionally intense issues of symptom management in the setting of serious illness.Greater than 50%  of this time was spent counseling and coordinating care related to the above assessment and plan.  Signed by: Nelwyn Salisbury, PA-C

## 2022-11-17 ENCOUNTER — Encounter: Payer: Self-pay | Admitting: Oncology

## 2022-11-18 ENCOUNTER — Encounter: Payer: Self-pay | Admitting: *Deleted

## 2022-11-18 ENCOUNTER — Encounter: Payer: Self-pay | Admitting: Family Medicine

## 2022-11-18 ENCOUNTER — Other Ambulatory Visit: Payer: Self-pay | Admitting: Medical Oncology

## 2022-11-18 ENCOUNTER — Other Ambulatory Visit: Payer: Self-pay | Admitting: *Deleted

## 2022-11-18 ENCOUNTER — Ambulatory Visit: Payer: 59 | Admitting: Family Medicine

## 2022-11-18 ENCOUNTER — Other Ambulatory Visit: Payer: Self-pay

## 2022-11-18 DIAGNOSIS — B370 Candidal stomatitis: Secondary | ICD-10-CM | POA: Insufficient documentation

## 2022-11-18 DIAGNOSIS — G43009 Migraine without aura, not intractable, without status migrainosus: Secondary | ICD-10-CM | POA: Diagnosis not present

## 2022-11-18 DIAGNOSIS — F5101 Primary insomnia: Secondary | ICD-10-CM | POA: Diagnosis not present

## 2022-11-18 MED ORDER — DOXYCYCLINE HYCLATE 100 MG PO TABS
100.0000 mg | ORAL_TABLET | Freq: Two times a day (BID) | ORAL | 0 refills | Status: DC
  Filled 2022-11-18: qty 20, 10d supply, fill #0

## 2022-11-18 MED ORDER — DOXYCYCLINE HYCLATE 100 MG PO TABS: 100.0000 mg | ORAL_TABLET | Freq: Two times a day (BID) | ORAL | 0 refills | Status: AC

## 2022-11-18 MED ORDER — TRAZODONE HCL 50 MG PO TABS
50.0000 mg | ORAL_TABLET | Freq: Every evening | ORAL | 1 refills | Status: DC | PRN
  Filled 2022-11-18: qty 90, 90d supply, fill #0

## 2022-11-18 MED ORDER — TRAZODONE HCL 50 MG PO TABS: 50.0000 mg | ORAL_TABLET | Freq: Every evening | ORAL | 1 refills | Status: DC | PRN

## 2022-11-18 NOTE — Assessment & Plan Note (Signed)
Stable on Topamax and Maxalt.  Continue.

## 2022-11-18 NOTE — Progress Notes (Signed)
Nichole Brown called to report her thrush and yeast around rectum have not improved.   Notified Nelwyn Salisbury, NP of this and Judson Roch is going to send in doxycycline as well.  Informed patient this will be sent in and to continue diflucan as well as adding doxycycline.

## 2022-11-18 NOTE — Assessment & Plan Note (Signed)
No clinical evidence of thrush today.  Supportive care.

## 2022-11-18 NOTE — Assessment & Plan Note (Addendum)
Uncontrolled.  Worsening.  Starting on trazodone.

## 2022-11-18 NOTE — Patient Instructions (Signed)
Medication as prescribed. ? ?Follow up in 3-6 months. ? ?Take care ? ?Dr. Anastacia Reinecke  ?

## 2022-11-18 NOTE — Progress Notes (Signed)
Subjective:  Patient ID: Nichole Brown, female    DOB: 1966/11/05  Age: 56 y.o. MRN: [id number]  CC: Chief Complaint  Patient presents with   Establish Care    First chemo done last Friday. Pt has thrush in mouth from medication. Former pt of our Network engineer.     HPI:  56 year old female with recently diagnosed breast cancer currently undergoing chemotherapy presents to establish care.  Following with oncology.  Has a history of migraine headache.  Currently on Topamax.  Uses Maxalt for acute treatment.  Recently developed thrush.  Still struggling with this.  Has been treated.  Will examine today.  Patient reports that she is having ongoing issues with insomnia.  This is a chronic problem with recent worsening likely due to recent diagnosis.  She has trouble staying asleep.  She would like to discuss starting something to help her sleep.  Patient also reports that she has had surgery on her left shoulder multiple times and is also had ongoing issues with right shoulder.  She has Celebrex on her medication list.  She does not take this regularly.  Will discuss today.  Patient Active Problem List   Diagnosis Date Noted   Thrush 11/18/2022   Malignant neoplasm of upper-outer quadrant of left breast in female, estrogen receptor positive ([place]) 08/29/2022   Depression, major, single episode, moderate (HCC) 01/01/2019   Generalized anxiety disorder 01/01/2019   Gastroesophageal reflux disease 12/26/2016   Migraine headache 07/24/2013   Insomnia 05/30/2013    Social Hx   Social History   Socioeconomic History   Marital status: Married    Spouse name: daniel   Number of children: 4   Years of education: Not on file   Highest education level: Some college, no degree  Occupational History   Not on file  Tobacco Use   Smoking status: Never   Smokeless tobacco: Never  Vaping Use   Vaping Use: Never used  Substance and Sexual Activity   Alcohol use: Yes    Comment:  occassionally-not since chemo started   Drug use: No   Sexual activity: Yes    Birth control/protection: Surgical    Comment: hyst  Other Topics Concern   Not on file  Social History Narrative   Not on file   Social Determinants of Health   Financial Resource Strain: Low Risk  (07/12/2022)   Overall Financial Resource Strain (CARDIA)    Difficulty of Paying Living Expenses: Not hard at all  Food Insecurity: No Food Insecurity (07/12/2022)   Hunger Vital Sign    Worried About Running Out of Food in the Last Year: Never true    Ran Out of Food in the Last Year: Never true  Transportation Needs: No Transportation Needs (07/12/2022)   PRAPARE - Hydrologist (Medical): No    Lack of Transportation (Non-Medical): No  Physical Activity: Insufficiently Active (07/12/2022)   Exercise Vital Sign    Days of Exercise per Week: 3 days    Minutes of Exercise per Session: 30 min  Stress: Stress Concern Present (07/12/2022)   [place]    Feeling of Stress : To some extent  Social Connections: Moderately Integrated (07/12/2022)   Social Connection and Isolation Panel [NHANES]    Frequency of Communication with Friends and Family: Once a week    Frequency of Social Gatherings with Friends and Family: Once a week    Attends Religious Services: More  than 4 times per year    Active Member of Clubs or Organizations: Yes    Attends Archivist Meetings: More than 4 times per year    Marital Status: Married    Review of Systems Per HPI  Objective:  BP 106/72   Pulse 91   Ht '5\' 2"'$  (1.575 m)   Wt 163 lb 12.8 oz (74.3 kg)   SpO2 98%   BMI 29.96 kg/m      11/18/2022   10:46 AM 11/16/2022    2:08 PM 11/14/2022   12:45 PM  BP/Weight  Systolic BP 354 656 812  Diastolic BP 72 71 78  Wt. (Lbs) 163.8 165   BMI 29.96 kg/m2 30.18 kg/m2     Physical Exam Vitals and nursing note reviewed.   Constitutional:      General: She is not in acute distress.    Appearance: Normal appearance.  HENT:     Head: Normocephalic and atraumatic.     Mouth/Throat:     Pharynx: Oropharynx is clear.  Eyes:     General:        Right eye: No discharge.        Left eye: No discharge.     Conjunctiva/sclera: Conjunctivae normal.  Cardiovascular:     Rate and Rhythm: Normal rate and regular rhythm.  Pulmonary:     Effort: Pulmonary effort is normal.     Breath sounds: Normal breath sounds. No wheezing, rhonchi or rales.  Neurological:     Mental Status: She is alert.  Psychiatric:        Mood and Affect: Mood normal.        Behavior: Behavior normal.     Lab Results  Component Value Date   WBC 8.6 11/11/2022   HGB 14.3 11/11/2022   HCT 42.6 11/11/2022   PLT 278 11/11/2022   GLUCOSE 121 (H) 11/11/2022   CHOL 162 07/23/2019   TRIG 122 07/23/2019   HDL 45 07/23/2019   LDLCALC 93 07/23/2019   ALT 42 11/11/2022   AST 25 11/11/2022   NA 140 11/11/2022   K 3.7 11/11/2022   CL 103 11/11/2022   CREATININE 0.61 11/11/2022   BUN 14 11/11/2022   CO2 26 11/11/2022   TSH 2.206 10/29/2021     Assessment & Plan:   Problem List Items Addressed This Visit       Cardiovascular and Mediastinum   Migraine headache    Stable on Topamax and Maxalt.  Continue.      Relevant Medications   traZODone (DESYREL) 50 MG tablet     Digestive   Thrush    No clinical evidence of thrush today.  Supportive care.        Other   Insomnia    Uncontrolled.  Worsening.  Starting on trazodone.       Meds ordered this encounter  Medications   DISCONTD: traZODone (DESYREL) 50 MG tablet    Sig: Take 1 tablet (50 mg total) by mouth at bedtime as needed for sleep.    Dispense:  90 tablet    Refill:  1   traZODone (DESYREL) 50 MG tablet    Sig: Take 1 tablet (50 mg total) by mouth at bedtime as needed for sleep.    Dispense:  90 tablet    Refill:  1    Follow-up: 3-6 months  [place]

## 2022-11-20 ENCOUNTER — Emergency Department
Admission: EM | Admit: 2022-11-20 | Discharge: 2022-11-20 | Disposition: A | Discharge status: Home / Self Care | Payer: 59 | Attending: Emergency Medicine | Admitting: Emergency Medicine

## 2022-11-20 ENCOUNTER — Encounter: Payer: Self-pay | Admitting: Emergency Medicine

## 2022-11-20 ENCOUNTER — Emergency Department: Payer: 59

## 2022-11-20 ENCOUNTER — Other Ambulatory Visit: Payer: Self-pay

## 2022-11-20 DIAGNOSIS — M79601 Pain in right arm: Secondary | ICD-10-CM | POA: Diagnosis not present

## 2022-11-20 DIAGNOSIS — M542 Cervicalgia: Secondary | ICD-10-CM | POA: Diagnosis not present

## 2022-11-20 DIAGNOSIS — Z853 Personal history of malignant neoplasm of breast: Secondary | ICD-10-CM | POA: Insufficient documentation

## 2022-11-20 NOTE — ED Triage Notes (Signed)
Pt sts that she had a port placed on 12/04. Pt sts that she thinks she has a possible due to pain in her neck.

## 2022-11-20 NOTE — ED Provider Notes (Signed)
   [facility] Provider Note    Event Date/Time   First MD Initiated Contact with Patient 11/20/22 1239     (approximate)   History   Vascular Access Problem   HPI  Nichole Brown is a 56 y.o. female with history of breast cancer who recently had port placed on December 4.  She reports that had been feeling well but she has now developed pain in the neck on the right side that she is concerned about.  She called the office and was referred to the emergency department.     Physical Exam   Triage Vital Signs: ED Triage Vitals  Enc Vitals Group     BP 11/20/22 1230 105/63     Pulse Rate 11/20/22 1230 85     Resp 11/20/22 1230 17     Temp 11/20/22 1230 98.2 F (36.8 C)     Temp Source 11/20/22 1230 Oral     SpO2 11/20/22 1230 96 %     Weight 11/20/22 1231 72.6 kg (160 lb)     Height 11/20/22 1240 1.575 m ('5\' 2"'$ )     Head Circumference --      Peak Flow --      Pain Score --      Pain Loc --      Pain Edu? --      Excl. in [place]? --     Most recent vital signs: Vitals:   11/20/22 1230  BP: 105/63  Pulse: 85  Resp: 17  Temp: 98.2 F (36.8 C)  SpO2: 96%     General: Awake, no distress.  CV:  Good peripheral perfusion.  Port incision sites look good, no redness, no discharge, no tenderness or fluctuance Resp:  Normal effort.  Abd:  No distention.  Other:     ED Results / Procedures / Treatments   Labs (all labs ordered are listed, but only abnormal results are displayed) Labs Reviewed - No data to display   EKG     RADIOLOGY Ultrasound of the neck    PROCEDURES:  Critical Care performed:   Procedures   MEDICATIONS ORDERED IN ED: Medications - No data to display   IMPRESSION / MDM / [place] / ED COURSE  I reviewed the triage vital signs and the nursing notes. Patient's presentation is most consistent with acute complicated illness / injury requiring diagnostic workup.  Patient presents with right neck  pain in the setting of recent port placement into the right IJ.  Differential includes infection, clot.  Will obtain ultrasound  Sites look good, not consistent with infection, afebrile.  Ultrasound is reassuring, no evidence of clot, appropriate discharge at this time with follow-up with her surgeon.      FINAL CLINICAL IMPRESSION(S) / ED DIAGNOSES   Final diagnoses:  Neck pain     Rx / DC Orders   ED Discharge Orders     None        Note:  This document was prepared using Dragon voice recognition software and may include unintentional dictation errors.   Lavonia Drafts, MD 11/20/22 [phone]

## 2022-11-21 ENCOUNTER — Other Ambulatory Visit: Payer: Self-pay

## 2022-11-21 ENCOUNTER — Encounter: Payer: Self-pay | Admitting: *Deleted

## 2022-11-21 DIAGNOSIS — L282 Other prurigo: Secondary | ICD-10-CM

## 2022-11-21 DIAGNOSIS — Z79899 Other long term (current) drug therapy: Secondary | ICD-10-CM

## 2022-11-21 DIAGNOSIS — Z170 Estrogen receptor positive status [ER+]: Secondary | ICD-10-CM

## 2022-11-21 MED ORDER — PREDNISONE 10 MG PO TABS: ORAL_TABLET | ORAL | 0 refills | Status: DC

## 2022-11-21 NOTE — Progress Notes (Signed)
Nichole Brown called this morning reporting itching over her entire body, rash on her hands and back.   Skin feels like sandpaper.   Notified Dr. Janese Banks and prednisone taper is being sent in.

## 2022-11-24 ENCOUNTER — Encounter: Payer: Self-pay | Admitting: *Deleted

## 2022-11-24 ENCOUNTER — Encounter: Payer: Self-pay | Admitting: Oncology

## 2022-11-24 NOTE — Progress Notes (Signed)
Spoke with Nichole Brown about her [employer] paperwork.   [employer] is requiring an accommodation so she will need new paperwork sent in.   She would like to discuss with Dr. Janese Banks if she should do continuous or intermittent when she comes in on 12/22.  Relayed this information to [place].  We will wait to fill out new paperwork until after 12/22 and [name] decides how to move forward.

## 2022-11-26 ENCOUNTER — Encounter: Payer: Self-pay | Admitting: Oncology

## 2022-12-01 MED FILL — Dexamethasone Sodium Phosphate Inj 100 MG/10ML: INTRAMUSCULAR | Qty: 1 | Status: AC

## 2022-12-02 ENCOUNTER — Inpatient Hospital Stay: Payer: 59

## 2022-12-02 ENCOUNTER — Other Ambulatory Visit: Payer: Self-pay

## 2022-12-02 ENCOUNTER — Encounter: Payer: Self-pay | Admitting: *Deleted

## 2022-12-02 ENCOUNTER — Inpatient Hospital Stay (HOSPITAL_BASED_OUTPATIENT_CLINIC_OR_DEPARTMENT_OTHER): Payer: 59 | Admitting: Oncology

## 2022-12-02 ENCOUNTER — Encounter: Payer: Self-pay | Admitting: Oncology

## 2022-12-02 VITALS — BP 120/79 | HR 78 | Temp 98.7°F | Resp 16 | Ht 62.0 in | Wt 167.6 lb

## 2022-12-02 DIAGNOSIS — G629 Polyneuropathy, unspecified: Secondary | ICD-10-CM | POA: Diagnosis not present

## 2022-12-02 DIAGNOSIS — Z5111 Encounter for antineoplastic chemotherapy: Secondary | ICD-10-CM

## 2022-12-02 DIAGNOSIS — Z5189 Encounter for other specified aftercare: Secondary | ICD-10-CM | POA: Diagnosis not present

## 2022-12-02 DIAGNOSIS — C50412 Malignant neoplasm of upper-outer quadrant of left female breast: Secondary | ICD-10-CM

## 2022-12-02 DIAGNOSIS — Z170 Estrogen receptor positive status [ER+]: Secondary | ICD-10-CM | POA: Diagnosis not present

## 2022-12-02 DIAGNOSIS — L270 Generalized skin eruption due to drugs and medicaments taken internally: Secondary | ICD-10-CM

## 2022-12-02 DIAGNOSIS — Z791 Long term (current) use of non-steroidal anti-inflammatories (NSAID): Secondary | ICD-10-CM | POA: Diagnosis not present

## 2022-12-02 DIAGNOSIS — Z79899 Other long term (current) drug therapy: Secondary | ICD-10-CM | POA: Diagnosis not present

## 2022-12-02 DIAGNOSIS — K219 Gastro-esophageal reflux disease without esophagitis: Secondary | ICD-10-CM | POA: Diagnosis not present

## 2022-12-02 DIAGNOSIS — G4700 Insomnia, unspecified: Secondary | ICD-10-CM | POA: Diagnosis not present

## 2022-12-02 LAB — COMPREHENSIVE METABOLIC PANEL
ALT: 22 U/L (ref 0–44)
AST: 17 U/L (ref 15–41)
Albumin: 3.8 g/dL (ref 3.5–5.0)
Alkaline Phosphatase: 63 U/L (ref 38–126)
Anion gap: 5 (ref 5–15)
BUN: 12 mg/dL (ref 6–20)
CO2: 26 mmol/L (ref 22–32)
Calcium: 8.5 mg/dL — ABNORMAL LOW (ref 8.9–10.3)
Chloride: 106 mmol/L (ref 98–111)
Creatinine, Ser: 0.57 mg/dL (ref 0.44–1.00)
GFR, Estimated: >60 mL/min (ref >60)
Glucose, Bld: 112 mg/dL — ABNORMAL HIGH (ref 70–99)
Potassium: 3.9 mmol/L (ref 3.5–5.1)
Sodium: 137 mmol/L (ref 135–145)
Total Bilirubin: 0.4 mg/dL (ref 0.3–1.2)
Total Protein: 6.3 g/dL — ABNORMAL LOW (ref 6.5–8.1)

## 2022-12-02 LAB — CBC WITH DIFFERENTIAL/PLATELET
Abs Immature Granulocytes: 0.17 10*3/uL — ABNORMAL HIGH (ref 0.00–0.07)
Basophils Absolute: 0.2 10*3/uL — ABNORMAL HIGH (ref 0.0–0.1)
Basophils Relative: 2 %
Eosinophils Absolute: 0.4 10*3/uL (ref 0.0–0.5)
Eosinophils Relative: 4 %
HCT: 36.6 % (ref 36.0–46.0)
Hemoglobin: 12.3 g/dL (ref 12.0–15.0)
Immature Granulocytes: 2 %
Lymphocytes Relative: 35 %
Lymphs Abs: 3.7 10*3/uL (ref 0.7–4.0)
MCH: 29.9 pg (ref 26.0–34.0)
MCHC: 33.6 g/dL (ref 30.0–36.0)
MCV: 89.1 fL (ref 80.0–100.0)
Monocytes Absolute: 1.2 10*3/uL — ABNORMAL HIGH (ref 0.1–1.0)
Monocytes Relative: 11 %
Neutro Abs: 5.1 10*3/uL (ref 1.7–7.7)
Neutrophils Relative %: 46 %
Platelets: 365 10*3/uL (ref 150–400)
RBC: 4.11 MIL/uL (ref 3.87–5.11)
RDW: 14.5 % (ref 11.5–15.5)
WBC: 10.9 10*3/uL — ABNORMAL HIGH (ref 4.0–10.5)
nRBC: 0 % (ref 0.0–0.2)

## 2022-12-02 MED ORDER — PALONOSETRON HCL INJECTION 0.25 MG/5ML
0.2500 mg | Freq: Once | INTRAVENOUS | Status: AC
  Administered 2022-12-02: 0.25 mg via INTRAVENOUS
  Filled 2022-12-02: qty 5

## 2022-12-02 MED ORDER — HEPARIN SOD (PORK) LOCK FLUSH 100 UNIT/ML IV SOLN
500.0000 [IU] | Freq: Once | INTRAVENOUS | Status: AC | PRN
  Administered 2022-12-02: 500 [IU]
  Filled 2022-12-02: qty 5

## 2022-12-02 MED ORDER — SODIUM CHLORIDE 0.9 % IV SOLN
10.0000 mg | Freq: Once | INTRAVENOUS | Status: AC
  Administered 2022-12-02: 10 mg via INTRAVENOUS
  Filled 2022-12-02: qty 10

## 2022-12-02 MED ORDER — SODIUM CHLORIDE 0.9 % IV SOLN
75.0000 mg/m2 | Freq: Once | INTRAVENOUS | Status: AC
  Administered 2022-12-02: 140 mg via INTRAVENOUS
  Filled 2022-12-02: qty 14

## 2022-12-02 MED ORDER — PREDNISONE 5 MG PO TABS
20.0000 mg | ORAL_TABLET | Freq: Every day | ORAL | 2 refills | Status: DC
  Filled 2022-12-02: qty 40, 10d supply, fill #0
  Filled 2022-12-19: qty 40, 10d supply, fill #1
  Filled 2023-01-09: qty 40, 10d supply, fill #2

## 2022-12-02 MED ORDER — BETAMETHASONE DIPROPIONATE 0.05 % EX CREA
TOPICAL_CREAM | Freq: Two times a day (BID) | CUTANEOUS | 0 refills | Status: DC
  Filled 2022-12-02: qty 30, 30d supply, fill #0

## 2022-12-02 MED ORDER — SODIUM CHLORIDE 0.9 % IV SOLN
600.0000 mg/m2 | Freq: Once | INTRAVENOUS | Status: AC
  Administered 2022-12-02: 1100 mg via INTRAVENOUS
  Filled 2022-12-02: qty 50

## 2022-12-02 MED ORDER — SODIUM CHLORIDE 0.9 % IV SOLN
Freq: Once | INTRAVENOUS | Status: AC
  Filled 2022-12-02: qty 250

## 2022-12-02 MED ORDER — PANTOPRAZOLE SODIUM 20 MG PO TBEC
20.0000 mg | DELAYED_RELEASE_TABLET | Freq: Every day | ORAL | 3 refills | Status: DC
  Filled 2022-12-02: qty 30, 30d supply, fill #0
  Filled 2022-12-23: qty 30, 30d supply, fill #1

## 2022-12-02 NOTE — Patient Instructions (Signed)
[facility] [place]  Discharge Instructions: Thank you for choosing [facility] to provide your oncology and hematology care.  If you have a lab appointment with the Cancer Center, please go directly to the Cancer Center and check in at the registration area.  Wear comfortable clothing and clothing appropriate for easy access to any Portacath or PICC line.   We strive to give you quality time with your provider. You may need to reschedule your appointment if you arrive late (15 or more minutes).  Arriving late affects you and other patients whose appointments are after yours.  Also, if you miss three or more appointments without notifying the office, you may be dismissed from the clinic at the provider's discretion.      For prescription refill requests, have your pharmacy contact our office and allow 72 hours for refills to be completed.       To help prevent nausea and vomiting after your treatment, we encourage you to take your nausea medication as directed.  BELOW ARE SYMPTOMS THAT SHOULD BE REPORTED IMMEDIATELY: *FEVER GREATER THAN 100.4 F (38 C) OR HIGHER *CHILLS OR SWEATING *NAUSEA AND VOMITING THAT IS NOT CONTROLLED WITH YOUR NAUSEA MEDICATION *UNUSUAL SHORTNESS OF BREATH *UNUSUAL BRUISING OR BLEEDING *URINARY PROBLEMS (pain or burning when urinating, or frequent urination) *BOWEL PROBLEMS (unusual diarrhea, constipation, pain near the anus) TENDERNESS IN MOUTH AND THROAT WITH OR WITHOUT PRESENCE OF ULCERS (sore throat, sores in mouth, or a toothache) UNUSUAL RASH, SWELLING OR PAIN  UNUSUAL VAGINAL DISCHARGE OR ITCHING   Items with * indicate a potential emergency and should be followed up as soon as possible or go to the Emergency Department if any problems should occur.  Please show the CHEMOTHERAPY ALERT CARD or IMMUNOTHERAPY ALERT CARD at check-in to the Emergency Department and triage nurse.  Should you have questions after your  visit or need to cancel or reschedule your appointment, please contact [facility]  [phone] and follow the prompts.  Office hours are 8:00 a.m. to 4:30 p.m. Monday - Friday. Please note that voicemails left after 4:00 p.m. may not be returned until the following business day.  We are closed weekends and major holidays. You have access to a nurse at all times for urgent questions. Please call the main number to the clinic [phone] and follow the prompts.  For any non-urgent questions, you may also contact your provider using MyChart. We now offer e-Visits for anyone 18 and older to request care online for non-urgent symptoms. For details visit [email or web address].   Also download the MyChart app! Go to the app store, search "MyChart", open the app, select [facility], and log in with your MyChart username and password.  Masks are optional in the cancer centers. If you would like for your care team to wear a mask while they are taking care of you, please let them know. For doctor visits, patients may have with them one support person who is at least 58 years old. At this time, visitors are not allowed in the infusion area.   

## 2022-12-02 NOTE — Progress Notes (Signed)
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  This was followed by a diagnostic mammogram and ultrasound which showed multiple benign cysts noted at the 1 o'clock position.  At the 2 o'clock position 3 cm from the nipple was a oval hypoechoic mass measuring 5 x 4 x 3 mm.  No suspicious left axillary adenopathy.  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   Final pathology showed grade two 7 mm tumor with negative margins.  1 sentinel lymph node negative for malignancy.  Oncotype results showed a recurrence score of 28 and absolute benefit of chemotherapy at greater than 15%.    Interval history-patient did have significant generalized erythematous pruritic skin rash after cycle 1 of chemotherapy.  She could not take prophylactic Decadron because of side effects.  She was given Medrol Dosepak which did resolve the rash.  Also reports some symptoms of indigestion for a couple of days postchemotherapy.  ECOG PS- 0 Pain scale- 0   Review of systems- Review of Systems  Constitutional:  Negative for chills, fever, malaise/fatigue and weight  loss.  HENT:  Negative for congestion, ear discharge and nosebleeds.   Eyes:  Negative for blurred vision.  Respiratory:  Negative for cough, hemoptysis, sputum production, shortness of breath and wheezing.   Cardiovascular:  Negative for chest pain, palpitations, orthopnea and claudication.  Gastrointestinal:  Negative for abdominal pain, blood in stool, constipation, diarrhea, heartburn, melena, nausea and vomiting.  Genitourinary:  Negative for dysuria, flank pain, frequency, hematuria and urgency.  Musculoskeletal:  Negative for back pain, joint pain and myalgias.  Skin:  Positive for rash.  Neurological:  Negative for dizziness, tingling, focal weakness, seizures, weakness and headaches.  Endo/Heme/Allergies:  Does not bruise/bleed easily.  Psychiatric/Behavioral:  Negative for depression and suicidal ideas. The patient does not have insomnia.       Allergies  Allergen Reactions   Cymbalta [Duloxetine Hcl] Other (See Comments)    Hallucinations; sleep walking   Nortriptyline Nausea And Vomiting   Phenergan [Promethazine Hcl]     Restless legs and cramping     Past Medical History:  Diagnosis Date   Anxiety    Bronchitis    Cancer ([place]) 09/2022   left breast IMC   Complication of anesthesia    PONV   Contusion of leg 10/06/2015   Depression    GERD (gastroesophageal reflux disease)    Insomnia    Migraine headache    Pneumonia    2000   PONV (postoperative nausea and vomiting)      Past Surgical History:  Procedure Laterality Date   ABDOMINAL HYSTERECTOMY     BALLOON DILATION N/A 12/14/2020   Procedure: BALLOON DILATION;  Surgeon: Eloise Harman, DO;  [facility];  Service: Endoscopy;  Laterality: N/A;   BIOPSY  12/14/2020   Procedure: BIOPSY;  Surgeon: Eloise Harman, DO;  [facility];  Service: Endoscopy;;   BREAST BIOPSY Left 08/24/2022   [place] bx, coil marker path pending   BREAST LUMPECTOMY WITH RADIOACTIVE SEED AND SENTINEL  LYMPH NODE BIOPSY Left 09/29/2022   Procedure: LEFT BREAST LUMPECTOMY WITH RADIOACTIVE SEED AND AXILLARY SENTINEL LYMPH NODE BIOPSY;  Surgeon: Rolm Bookbinder, MD;  Location: [place];  Service: General;  Laterality: Left;   CESAREAN SECTION     CHOLECYSTECTOMY  1988   COLONOSCOPY N/A 12/10/2018    six 4-8 mm polyps in proximal descending colon, mid transverse and hepatic flexure. Diverticulosis. External and internal hemorrhoids. Torturous colon. Two simple adenomas, two serrated polyps, 2 hyperplastic polyps. Surveillance due Dec 2022.    ESOPHAGOGASTRODUODENOSCOPY (EGD) WITH PROPOFOL N/A 12/14/2020   Benign-appearing esophageal stenosis s/p dilation, gastritis, normal duodenum. Mild chronic gastritis.    open heart surgery  1967   PATENT DUCTUS ARTERIOUS REPAIR  1967   POLYPECTOMY  12/10/2018   Procedure: POLYPECTOMY;  Surgeon: Danie Binder, MD;  [facility];  Service: Endoscopy;;  hepatic flexure, descending, transverse   PORTACATH PLACEMENT N/A 11/14/2022   Procedure: PORT PLACEMENT WITH ULTRASOUND GUIDANCE;  Surgeon: Rolm Bookbinder, MD;  Location: [place];  Service: General;  Laterality: N/A;   SHOULDER ARTHROSCOPY Left 06/16/2017   Procedure: LEFT SHOULDER ARTHROSCOPY, DEBRIDEMENT, AND DECOMPRESSION;  Surgeon: Newt Minion, MD;  Location: [place];  Service: Orthopedics;  Laterality: Left;   SHOULDER ARTHROSCOPY WITH SUBACROMIAL DECOMPRESSION Left 10/03/2019   Procedure: SHOULDER ARTHROSCOPY WITH DEBRIDEMENT, DECOMPRESSION;  Surgeon: Corky Mull, MD;  Location: [facility];  Service: Orthopedics;  Laterality: Left;   SHOULDER SURGERY Left 2008   [facility] joint    Social History   Socioeconomic History   Marital status: Married    Spouse name: daniel   Number of children: 4   Years of education: Not on file   Highest education level: Some college, no degree  Occupational History   Not on file  Tobacco Use   Smoking status: Never    Smokeless tobacco: Never  Vaping Use   Vaping Use: Never used  Substance and Sexual Activity   Alcohol use: Not Currently    Comment: occassionally-not since chemo started   Drug use: No   Sexual activity: Yes    Birth control/protection: Surgical    Comment: hyst  Other Topics Concern   Not on file  Social History Narrative   Not on file   Social Determinants of Health   Financial Resource Strain: Low Risk  (07/12/2022)   Overall Financial Resource Strain (CARDIA)    Difficulty of Paying Living Expenses: Not hard at all  Food Insecurity: No Food Insecurity (07/12/2022)   Hunger Vital Sign    Worried About Running Out of Food in the Last Year: Never true    [place] in the Last Year: Never true  Transportation Needs: No Transportation Needs (07/12/2022)   PRAPARE - Hydrologist (Medical): No    Lack of Transportation (Non-Medical): No  Physical Activity: Insufficiently Active (07/12/2022)   Exercise Vital Sign    Days of Exercise per Week: 3 days    Minutes of Exercise per Session: 30 min  Stress: Stress Concern Present (07/12/2022)   [employer] Occupational  Health - Occupational Stress Questionnaire    Feeling of Stress : To some extent  Social Connections: Moderately Integrated (07/12/2022)   Social Connection and Isolation Panel [NHANES]    Frequency of Communication with Friends and Family: Once a week    Frequency of Social Gatherings with Friends and Family: Once a week    Attends Religious Services: More than 4 times per year    Active Member of [employer] or Organizations: Yes    Attends Music therapist: More than 4 times per year    Marital Status: Married  Human resources officer Violence: Not At Risk (07/12/2022)   Humiliation, Afraid, Rape, and Kick questionnaire    Fear of Current or Ex-Partner: No    Emotionally Abused: No    Physically Abused: No    Sexually Abused: No    Family History  Problem Relation Age of  Onset   Breast cancer Mother 43   Hypertension Father    Colon cancer Paternal Uncle        dx 3s   Hypertension Maternal Grandmother    Diabetes Maternal Grandmother    Heart disease Maternal Grandmother 41       MI   Hypertension Maternal Grandfather    Throat cancer Maternal Grandfather 83     Current Outpatient Medications:    acetaminophen (TYLENOL) 500 MG tablet, Take 500 mg by mouth every 6 (six) hours as needed for moderate pain., Disp: , Rfl:    betamethasone dipropionate 0.05 % cream, Apply topically 2 (two) times daily., Disp: 30 g, Rfl: 0   CALCIUM PO, Take 1 capsule by mouth daily., Disp: , Rfl:    Cholecalciferol (VITAMIN D3 PO), Take 1 capsule by mouth daily., Disp: , Rfl:    famotidine (PEPCID) 20 MG tablet, Take 20 mg by mouth 2 (two) times daily., Disp: , Rfl:    fluticasone (FLONASE) 50 MCG/ACT nasal spray, Place 1 spray into both nostrils daily as needed for allergies or rhinitis., Disp: , Rfl:    lidocaine-prilocaine (EMLA) cream, Apply to affected area once, Disp: 30 g, Rfl: 3   Lifitegrast (XIIDRA) 5 % SOLN, Instill 1 drop into both eyes twice a day, Disp: 180 each, Rfl: 4   nystatin (MYCOSTATIN) 100000 UNIT/ML suspension, Take 5 mLs (500,000 Units total) by mouth 4 (four) times daily. Swish and spit, Disp: 120 mL, Rfl: 0   ondansetron (ZOFRAN-ODT) 4 MG disintegrating tablet, Take 1 tablet (4 mg total) by mouth every 8 (eight) hours as needed for Nausea migraine, Disp: 20 tablet, Rfl: 11   pantoprazole (PROTONIX) 20 MG tablet, Take 1 tablet (20 mg total) by mouth daily., Disp: 30 tablet, Rfl: 3   predniSONE (DELTASONE) 5 MG tablet, Take 4 tablets (20 mg total) by mouth daily with breakfast., Disp: 40 tablet, Rfl: 2   Probiotic Product (PROBIOTIC PO), Take 1 capsule by mouth daily., Disp: , Rfl:    rizatriptan (MAXALT-MLT) 10 MG disintegrating tablet, Take 1 tablet (10 mg total) by mouth once as needed (may repeat dose once in > 2 hrs) for up to 1 dose, Disp: 10  tablet, Rfl: 11   topiramate (TOPAMAX) 25 MG tablet, Take 1 tablet (25 mg total) by mouth 2 (two) times daily, Disp: 60 tablet, Rfl: 11   traMADol (ULTRAM) 50 MG tablet, Take 1 tablet (50 mg total) by mouth every 6 (six) hours as needed., Disp: 10 tablet, Rfl: 0   traZODone (DESYREL) 50 MG tablet, Take 1 tablet (50 mg total) by mouth  at bedtime as needed for sleep., Disp: 90 tablet, Rfl: 1   celecoxib (CELEBREX) 200 MG capsule, Take 1 capsule (200 mg total) by mouth 2 (two) times daily (Patient not taking: Reported on 12/02/2022), Disp: 60 capsule, Rfl: 2  Physical exam:  Vitals:   12/02/22 0905  BP: 120/79  Pulse: 78  Resp: 16  Temp: 98.7 F (37.1 C)  TempSrc: Oral  Weight: 167 lb 9.6 oz (76 kg)  Height: 5' 2" (1.575 m)   Physical Exam HENT:     Mouth/Throat:     Mouth: Mucous membranes are moist.     Pharynx: Oropharynx is clear.  Cardiovascular:     Rate and Rhythm: Normal rate and regular rhythm.     Heart sounds: Normal heart sounds.  Pulmonary:     Effort: Pulmonary effort is normal.     Breath sounds: Normal breath sounds.  Abdominal:     General: Bowel sounds are normal.     Palpations: Abdomen is soft.  Skin:    General: Skin is warm and dry.  Neurological:     Mental Status: She is alert and oriented to person, place, and time.         Latest Ref Rng & Units 12/02/2022    8:47 AM  CMP  Glucose 70 - 99 mg/dL 112   BUN 6 - 20 mg/dL 12   Creatinine 0.44 - 1.00 mg/dL 0.57   Sodium 135 - 145 mmol/L 137   Potassium 3.5 - 5.1 mmol/L 3.9   Chloride 98 - 111 mmol/L 106   CO2 22 - 32 mmol/L 26   Calcium 8.9 - 10.3 mg/dL 8.5   Total Protein 6.5 - 8.1 g/dL 6.3   Total Bilirubin 0.3 - 1.2 mg/dL 0.4   Alkaline Phos 38 - 126 U/L 63   AST 15 - 41 U/L 17   ALT 0 - 44 U/L 22       Latest Ref Rng & Units 12/02/2022    8:47 AM  CBC  WBC 4.0 - 10.5 K/uL 10.9   Hemoglobin 12.0 - 15.0 g/dL 12.3   Hematocrit 36.0 - 46.0 % 36.6   Platelets 150 - 400 K/uL 365     No  images are attached to the encounter.  US Venous Img Upper Uni Right(DVT)  Result Date: 11/20/2022 CLINICAL DATA:  Pain, recent port placement EXAM: Right UPPER EXTREMITY VENOUS DOPPLER ULTRASOUND TECHNIQUE: Gray-scale sonography with graded compression, as well as color Doppler and duplex ultrasound were performed to evaluate the upper extremity deep venous system from the level of the subclavian vein and including the jugular, axillary, basilic, radial, ulnar and upper cephalic vein. Spectral Doppler was utilized to evaluate flow at rest and with distal augmentation maneuvers. COMPARISON:  None Available. FINDINGS: Contralateral Subclavian Vein: Respiratory phasicity is normal and symmetric with the symptomatic side. No evidence of thrombus. Normal compressibility. Internal Jugular Vein: No evidence of thrombus. Normal compressibility, respiratory phasicity and response to augmentation. Subclavian Vein: No evidence of thrombus. Normal compressibility, respiratory phasicity and response to augmentation. Axillary Vein: No evidence of thrombus. Normal compressibility, respiratory phasicity and response to augmentation. Cephalic Vein: No evidence of thrombus. Normal compressibility, respiratory phasicity and response to augmentation. Basilic Vein: No evidence of thrombus. Normal compressibility, respiratory phasicity and response to augmentation. Brachial Veins: No evidence of thrombus. Normal compressibility, respiratory phasicity and response to augmentation. Radial Veins: No evidence of thrombus. Normal compressibility, respiratory phasicity and response to augmentation. Ulnar Veins: No evidence of thrombus.  Normal compressibility, respiratory phasicity and response to augmentation. Venous Reflux:  None visualized. Other Findings:  None visualized. IMPRESSION: No evidence of DVT within the right upper extremity. Electronically Signed   By: Sammie Bench M.D.   On: 11/20/2022 14:20   DG C-Arm 1-60  Min  Result Date: 11/15/2022 CLINICAL DATA:  Surgery, port placement. EXAM: DG C-ARM 1-60 MIN CONTRAST:  None. FLUOROSCOPY: Fluoroscopy Time:  0.14 seconds Radiation Exposure Index (if provided by the fluoroscopic device): 1.6 mGy Number of Acquired Spot Images: 1 COMPARISON:  None Available. FINDINGS: A right chest port is noted terminating over the superior vena cava. An endotracheal tube is present. IMPRESSION: Intraoperative utilization of fluoroscopy. Electronically Signed   By: Brett Fairy M.D.   On: 11/15/2022 04:18     Assessment and plan- Patient is a 56 y.o. 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  She is here for on treatment assessment prior to cycle 2 of adjuvant TC chemotherapy  Counts okay to proceed with cycle 2 of adjuvant TC chemotherapy today with [facility] tomorrow.  I will see her back in 3 weeks for cycle 3.  Plan is to complete 4 cycles followed by adjuvant radiation therapy.  Drug-induced skin rash: Likely secondary to Taxotere.  She has completed her steroid course but I am concerned that her rash will recur after this cycle.  Therefore asking her to take prednisone 20 mg once daily starting tomorrow for a week as prophylaxis.  She will also stay on Protonix 20 mg daily.  I am also sending her prescription for betamethasone topical cream if there are any areas which are particularly worse and can be managed by topical creams.  Patient verbalized understanding.   Visit Diagnosis 1. Malignant neoplasm of upper-outer quadrant of left breast in female, estrogen receptor positive ([place])   2. Encounter for antineoplastic chemotherapy   3. Drug-induced skin rash      Dr. Randa Evens, MD, MPH [facility] [phone] 12/02/2022 9:33 AM

## 2022-12-02 NOTE — Progress Notes (Signed)
Met with patient in infusion.  She is doing well, no needs at this time.

## 2022-12-03 ENCOUNTER — Inpatient Hospital Stay: Payer: 59

## 2022-12-03 VITALS — BP 115/76 | HR 75 | Temp 97.9°F | Resp 16

## 2022-12-03 DIAGNOSIS — Z170 Estrogen receptor positive status [ER+]: Secondary | ICD-10-CM | POA: Diagnosis not present

## 2022-12-03 DIAGNOSIS — Z79899 Other long term (current) drug therapy: Secondary | ICD-10-CM | POA: Diagnosis not present

## 2022-12-03 DIAGNOSIS — K219 Gastro-esophageal reflux disease without esophagitis: Secondary | ICD-10-CM | POA: Diagnosis not present

## 2022-12-03 DIAGNOSIS — G629 Polyneuropathy, unspecified: Secondary | ICD-10-CM | POA: Diagnosis not present

## 2022-12-03 DIAGNOSIS — C50412 Malignant neoplasm of upper-outer quadrant of left female breast: Secondary | ICD-10-CM

## 2022-12-03 DIAGNOSIS — Z5111 Encounter for antineoplastic chemotherapy: Secondary | ICD-10-CM | POA: Diagnosis not present

## 2022-12-03 DIAGNOSIS — Z791 Long term (current) use of non-steroidal anti-inflammatories (NSAID): Secondary | ICD-10-CM | POA: Diagnosis not present

## 2022-12-03 DIAGNOSIS — Z5189 Encounter for other specified aftercare: Secondary | ICD-10-CM | POA: Diagnosis not present

## 2022-12-03 DIAGNOSIS — G4700 Insomnia, unspecified: Secondary | ICD-10-CM | POA: Diagnosis not present

## 2022-12-03 MED ORDER — PEGFILGRASTIM-CBQV 6 MG/0.6ML ~~LOC~~ SOSY
6.0000 mg | PREFILLED_SYRINGE | Freq: Once | SUBCUTANEOUS | Status: AC
  Administered 2022-12-03: 6 mg via SUBCUTANEOUS

## 2022-12-06 ENCOUNTER — Encounter: Payer: Self-pay | Admitting: Oncology

## 2022-12-06 ENCOUNTER — Inpatient Hospital Stay (HOSPITAL_BASED_OUTPATIENT_CLINIC_OR_DEPARTMENT_OTHER): Payer: 59 | Admitting: Medical Oncology

## 2022-12-06 ENCOUNTER — Ambulatory Visit: Payer: 59

## 2022-12-06 ENCOUNTER — Encounter: Payer: Self-pay | Admitting: *Deleted

## 2022-12-06 DIAGNOSIS — K219 Gastro-esophageal reflux disease without esophagitis: Secondary | ICD-10-CM | POA: Diagnosis not present

## 2022-12-06 DIAGNOSIS — B349 Viral infection, unspecified: Secondary | ICD-10-CM | POA: Diagnosis not present

## 2022-12-06 DIAGNOSIS — G4700 Insomnia, unspecified: Secondary | ICD-10-CM | POA: Diagnosis not present

## 2022-12-06 DIAGNOSIS — R52 Pain, unspecified: Secondary | ICD-10-CM | POA: Diagnosis not present

## 2022-12-06 DIAGNOSIS — Z79899 Other long term (current) drug therapy: Secondary | ICD-10-CM | POA: Diagnosis not present

## 2022-12-06 DIAGNOSIS — E663 Overweight: Secondary | ICD-10-CM | POA: Diagnosis not present

## 2022-12-06 DIAGNOSIS — B370 Candidal stomatitis: Secondary | ICD-10-CM | POA: Diagnosis not present

## 2022-12-06 DIAGNOSIS — C50412 Malignant neoplasm of upper-outer quadrant of left female breast: Secondary | ICD-10-CM | POA: Diagnosis not present

## 2022-12-06 DIAGNOSIS — Z6829 Body mass index (BMI) 29.0-29.9, adult: Secondary | ICD-10-CM | POA: Diagnosis not present

## 2022-12-06 DIAGNOSIS — Z5189 Encounter for other specified aftercare: Secondary | ICD-10-CM | POA: Diagnosis not present

## 2022-12-06 DIAGNOSIS — R051 Acute cough: Secondary | ICD-10-CM | POA: Diagnosis not present

## 2022-12-06 DIAGNOSIS — Z170 Estrogen receptor positive status [ER+]: Secondary | ICD-10-CM | POA: Diagnosis not present

## 2022-12-06 DIAGNOSIS — G629 Polyneuropathy, unspecified: Secondary | ICD-10-CM | POA: Diagnosis not present

## 2022-12-06 DIAGNOSIS — Z791 Long term (current) use of non-steroidal anti-inflammatories (NSAID): Secondary | ICD-10-CM | POA: Diagnosis not present

## 2022-12-06 DIAGNOSIS — Z5111 Encounter for antineoplastic chemotherapy: Secondary | ICD-10-CM | POA: Diagnosis not present

## 2022-12-06 MED ORDER — NYSTATIN 100000 UNIT/ML MT SUSP: 5.0000 mL | Freq: Four times a day (QID) | OROMUCOSAL | 0 refills | Status: DC

## 2022-12-06 NOTE — Progress Notes (Unsigned)
Virtual Visit Progress Note  Ms. Millward,you are scheduled for a virtual visit with your provider today.    Just as we do with appointments in the office, we must obtain your consent to participate.  Your consent will be active for this visit and any virtual visit you may have with one of our providers in the next 365 days.    If you have a MyChart account, I can also send a copy of this consent to you electronically.  All virtual visits are billed to your insurance company just like a traditional visit in the office.  As this is a virtual visit, video technology does not allow for your provider to perform a traditional examination.  This may limit your provider's ability to fully assess your condition.  If your provider identifies any concerns that need to be evaluated in person or the need to arrange testing such as labs, EKG, etc, we will make arrangements to do so.    Although advances in technology are sophisticated, we cannot ensure that it will always work on either your end or our end.  If the connection with a video visit is poor, we may have to switch to a telephone visit.  With either a video or telephone visit, we are not always able to ensure that we have a secure connection.   I need to obtain your verbal consent now.   Are you willing to proceed with your visit today?   DEJANAY WAMBOLDT has provided verbal consent on 12/07/2022 for a virtual visit (video or telephone).   Hughie Closs, PA-C 12/07/2022  11:17 AM    I connected with [name] on 12/07/22 at  2:00 PM EST by video enabled telemedicine visit and verified that I am speaking with the correct person using two identifiers.   I discussed the limitations, risks, security and privacy concerns of performing an evaluation and management service by telemedicine and the availability of in-person appointments. I also discussed with the patient that there may be a patient responsible charge related to this service.  The patient expressed understanding and agreed to proceed.   Other persons participating in the visit and their role in the encounter: None  Patient's location: Home Provider's location: Clinic   Chief Complaint: Cough/chills, headache, body aches, thrush in mouth, muscle cramping, fatigue, dry skin without rash. Last treatment was on 12/02/2022 of Cytoxan, Docetaxel. Had udencya on 12/03/2022. Symptoms started last night around midnight but cough started all day yesterday. Able to drink fluids and eat some foods. Thrush started Saturday and she started nystatin oral treatment yesterday and today. Starting to help these symptoms. Had negative COVID-19 and flu testing at urgent care today. No sick contacts. No fevers, SOB, hemoptysis. Has not taken anything for symptoms.     Patient Care Team: Coral Spikes, DO as PCP - General (Family Medicine) Danie Binder, MD (Inactive) as Consulting Physician (Gastroenterology) Daiva Huge, RN as Oncology Nurse Navigator   Name of the patient: Nichole Brown  [id number]  [birth year]   Date of visit: 12/07/22  Review of systems- ROS As stated above in HPI  Allergies  Allergen Reactions   Cymbalta [Duloxetine Hcl] Other (See Comments)    Hallucinations; sleep walking   Nortriptyline Nausea And Vomiting   Phenergan [Promethazine Hcl]     Restless legs and cramping    Past Medical History:  Diagnosis Date   Anxiety    Bronchitis    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   Complication of anesthesia    PONV   Contusion of leg 10/06/2015   Depression    GERD (gastroesophageal reflux disease)    Insomnia    Migraine headache    Pneumonia    2000   PONV (postoperative nausea and vomiting)     Past Surgical History:  Procedure Laterality Date   ABDOMINAL HYSTERECTOMY     BALLOON DILATION N/A 12/14/2020   Procedure: BALLOON DILATION;  Surgeon: Eloise Harman, DO;  [facility];  Service: Endoscopy;  Laterality: N/A;    BIOPSY  12/14/2020   Procedure: BIOPSY;  Surgeon: Eloise Harman, DO;  [facility];  Service: Endoscopy;;   BREAST BIOPSY Left 08/24/2022   [place] bx, coil marker path pending   BREAST LUMPECTOMY WITH RADIOACTIVE SEED AND SENTINEL LYMPH NODE BIOPSY Left 09/29/2022   Procedure: LEFT BREAST LUMPECTOMY WITH RADIOACTIVE SEED AND AXILLARY SENTINEL LYMPH NODE BIOPSY;  Surgeon: Rolm Bookbinder, MD;  Location: [place];  Service: General;  Laterality: Left;   CESAREAN SECTION     CHOLECYSTECTOMY  1988   COLONOSCOPY N/A 12/10/2018    six 4-8 mm polyps in proximal descending colon, mid transverse and hepatic flexure. Diverticulosis. External and internal hemorrhoids. Torturous colon. Two simple adenomas, two serrated polyps, 2 hyperplastic polyps. Surveillance due Dec 2022.    ESOPHAGOGASTRODUODENOSCOPY (EGD) WITH PROPOFOL N/A 12/14/2020   Benign-appearing esophageal stenosis s/p dilation, gastritis, normal duodenum. Mild chronic gastritis.    open heart surgery  1967   PATENT DUCTUS ARTERIOUS REPAIR  1967   POLYPECTOMY  12/10/2018   Procedure: POLYPECTOMY;  Surgeon: Danie Binder, MD;  [facility];  Service: Endoscopy;;  hepatic flexure, descending, transverse   PORTACATH PLACEMENT N/A 11/14/2022   Procedure: PORT PLACEMENT WITH ULTRASOUND GUIDANCE;  Surgeon: Rolm Bookbinder, MD;  Location: [place];  Service: General;  Laterality: N/A;   SHOULDER ARTHROSCOPY Left 06/16/2017   Procedure: LEFT SHOULDER ARTHROSCOPY, DEBRIDEMENT, AND DECOMPRESSION;  Surgeon: Newt Minion, MD;  Location: [place];  Service: Orthopedics;  Laterality: Left;   SHOULDER ARTHROSCOPY WITH SUBACROMIAL DECOMPRESSION Left 10/03/2019   Procedure: SHOULDER ARTHROSCOPY WITH DEBRIDEMENT, DECOMPRESSION;  Surgeon: Corky Mull, MD;  Location: [facility];  Service: Orthopedics;  Laterality: Left;   SHOULDER SURGERY Left 2008   [facility] joint    Social History   Socioeconomic  History   Marital status: Married    Spouse name: daniel   Number of children: 4   Years of education: Not on file   Highest education level: Some college, no degree  Occupational History   Not on file  Tobacco Use   Smoking status: Never   Smokeless tobacco: Never  Vaping Use   Vaping Use: Never used  Substance and Sexual Activity   Alcohol use: Not Currently    Comment: occassionally-not since chemo started   Drug use: No   Sexual activity: Yes    Birth control/protection: Surgical    Comment: hyst  Other Topics Concern   Not on file  Social History Narrative   Not on file   Social Determinants of Health   Financial Resource Strain: Low Risk  (07/12/2022)   Overall Financial Resource Strain (CARDIA)    Difficulty of Paying Living Expenses: Not hard at all  Food Insecurity: No Food Insecurity (07/12/2022)   Hunger Vital Sign    Worried About Running Out of Food in the Last Year: Never true  Ran Out of Food in the Last Year: Never true  Transportation Needs: No Transportation Needs (07/12/2022)   PRAPARE - Hydrologist (Medical): No    Lack of Transportation (Non-Medical): No  Physical Activity: Insufficiently Active (07/12/2022)   Exercise Vital Sign    Days of Exercise per Week: 3 days    Minutes of Exercise per Session: 30 min  Stress: Stress Concern Present (07/12/2022)   [place]    Feeling of Stress : To some extent  Social Connections: Moderately Integrated (07/12/2022)   Social Connection and Isolation Panel [NHANES]    Frequency of Communication with Friends and Family: Once a week    Frequency of Social Gatherings with Friends and Family: Once a week    Attends Religious Services: More than 4 times per year    Active Member of Clubs or Organizations: Yes    Attends Archivist Meetings: More than 4 times per year    Marital Status: Married  Human resources officer  Violence: Not At Risk (07/12/2022)   Humiliation, Afraid, Rape, and Kick questionnaire    Fear of Current or Ex-Partner: No    Emotionally Abused: No    Physically Abused: No    Sexually Abused: No    Immunization History  Administered Date(s) Administered   Influenza Whole 09/24/2018   Influenza, Seasonal, Injecte, Preservative Fre 10/15/2015   Influenza,inj,Quad PF,6+ Mos 10/15/2015   Influenza,inj,quad, With Preservative 09/11/2017   Influenza-Unspecified 09/02/2019, 09/09/2020   PFIZER(Purple Top)SARS-COV-2 Vaccination 03/20/2020, 04/17/2020   Tdap 10/03/2015    Family History  Problem Relation Age of Onset   Breast cancer Mother 89   Hypertension Father    Colon cancer Paternal Uncle        dx 65s   Hypertension Maternal Grandmother    Diabetes Maternal Grandmother    Heart disease Maternal Grandmother 4       MI   Hypertension Maternal Grandfather    Throat cancer Maternal Grandfather 83     Current Outpatient Medications:    acetaminophen (TYLENOL) 500 MG tablet, Take 500 mg by mouth every 6 (six) hours as needed for moderate pain., Disp: , Rfl:    betamethasone dipropionate 0.05 % cream, Apply topically 2 (two) times daily., Disp: 30 g, Rfl: 0   CALCIUM PO, Take 1 capsule by mouth daily., Disp: , Rfl:    famotidine (PEPCID) 20 MG tablet, Take 20 mg by mouth 2 (two) times daily., Disp: , Rfl:    fluticasone (FLONASE) 50 MCG/ACT nasal spray, Place 1 spray into both nostrils daily as needed for allergies or rhinitis., Disp: , Rfl:    lidocaine-prilocaine (EMLA) cream, Apply to affected area once, Disp: 30 g, Rfl: 3   Lifitegrast (XIIDRA) 5 % SOLN, Instill 1 drop into both eyes twice a day, Disp: 180 each, Rfl: 4   nystatin (MYCOSTATIN) 100000 UNIT/ML suspension, Take 5 mLs (500,000 Units total) by mouth 4 (four) times daily. Swish and spit, Disp: 120 mL, Rfl: 0   ondansetron (ZOFRAN-ODT) 4 MG disintegrating tablet, Take 1 tablet (4 mg total) by mouth every 8 (eight)  hours as needed for Nausea migraine, Disp: 20 tablet, Rfl: 11   pantoprazole (PROTONIX) 20 MG tablet, Take 1 tablet (20 mg total) by mouth daily., Disp: 30 tablet, Rfl: 3   predniSONE (DELTASONE) 5 MG tablet, Take 4 tablets (20 mg total) by mouth daily with breakfast., Disp: 40 tablet, Rfl: 2   Probiotic  Product (PROBIOTIC PO), Take 1 capsule by mouth daily., Disp: , Rfl:    rizatriptan (MAXALT-MLT) 10 MG disintegrating tablet, Take 1 tablet (10 mg total) by mouth once as needed (may repeat dose once in > 2 hrs) for up to 1 dose, Disp: 10 tablet, Rfl: 11   topiramate (TOPAMAX) 25 MG tablet, Take 1 tablet (25 mg total) by mouth 2 (two) times daily, Disp: 60 tablet, Rfl: 11   traMADol (ULTRAM) 50 MG tablet, Take 1 tablet (50 mg total) by mouth every 6 (six) hours as needed., Disp: 10 tablet, Rfl: 0   traZODone (DESYREL) 50 MG tablet, Take 1 tablet (50 mg total) by mouth at bedtime as needed for sleep., Disp: 90 tablet, Rfl: 1  Physical exam: Exam limited due to telemedicine Physical Exam Constitutional:      General: She is not in acute distress.    Appearance: Normal appearance. She is not ill-appearing, toxic-appearing or diaphoretic.  Pulmonary:     Effort: Pulmonary effort is normal.     Breath sounds: Normal breath sounds.  Neurological:     Mental Status: She is alert.       Assessment and plan- Patient is a 56 y.o. female with symptoms that likely are secondary to Udenyca vs non influenza/COVID-19 viral illness which I discussed. No sick contacts. No antiviral medication needed at this time given negative viral testing. No red flag signs and symptoms. I have suggested that she continue to stay hydrated, take mucinex PRN as directed on the packaging/bottle PRN. Reviewed red flag signs and symptoms. Offered in clinic [facility] visit PRN. Refilled her nystatin mouthwash.    Visit Diagnosis 1. Encounter for antineoplastic chemotherapy   2. Oral thrush   3. Body aches   4. Acute cough      Patient expressed understanding and was in agreement with this plan. She also understands that She can call clinic at any time with any questions, concerns, or complaints.   I discussed the assessment and treatment plan with the patient. The patient was provided an opportunity to ask questions and all were answered. The patient agreed with the plan and demonstrated an understanding of the instructions.   The patient was advised to call back or seek an in-person evaluation if the symptoms worsen or if the condition fails to improve as anticipated.   I spent 10 minutes face-to-face video visit time dedicated to the care of this patient on the date of this encounter to include pre-visit review of last clinic note, face-to-face time with the patient, and post visit ordering of testing/documentation.    Thank you for allowing me to participate in the care of this very pleasant patient.   Nelwyn Salisbury PA-C

## 2022-12-06 NOTE — Progress Notes (Signed)
Ms. Nichole Brown called this morning with reports of cough, headache, bodyaches, runny nose and thrush, afebrile.  Discussed with Dr. Janese Banks and Nelwyn Salisbury and the recommendation is to go to urgent care for viral testing-flu, RSV, Covid.   Gave Ms. Nichole Brown recommendations and she will go to urgent care and let us know what they say.

## 2022-12-06 NOTE — Telephone Encounter (Signed)
Patient spoke to [place]. Recommendations for covid/flu/rsv testing + virtual visit with [facility] or patient can go to urgent care per Dr. Maryclare Labrador, PA. Ana called patient back. She will go to urgent care for work up and let our office know the outcome.

## 2022-12-07 ENCOUNTER — Encounter: Payer: Self-pay | Admitting: Oncology

## 2022-12-08 ENCOUNTER — Encounter: Payer: Self-pay | Admitting: *Deleted

## 2022-12-08 NOTE — Progress Notes (Signed)
Ms. Way called to give an update on how she is feeling.   She is starting to feel better.  She has a rash but it is not burning, just some itching.  She thinks the prednisone is helping.  She is going to keep an eye on the rash and if it worsens she will call us.

## 2022-12-14 ENCOUNTER — Telehealth: Payer: Self-pay | Admitting: *Deleted

## 2022-12-14 NOTE — Telephone Encounter (Signed)
Patient informed of doctor response and is in agreement

## 2022-12-14 NOTE — Telephone Encounter (Signed)
Patients with viral illnesss can have a prolonged cough that can last for weeks to months. I would not worry about this. Will discuss at next visit

## 2022-12-14 NOTE — Telephone Encounter (Signed)
Patient called reporting that she has a cough the end of December that is lingering Denies fever Cough is croupy sounding. She sometimes brings up clear mucous. Denies shortness of breath or chest tightness. She has been taking tylenol, but no cough medicine. She is asking if she needs to be concerned. She had been tested for Flu and Covid when symptoms first started and was negative. Please advise

## 2022-12-19 ENCOUNTER — Other Ambulatory Visit: Payer: Self-pay

## 2022-12-22 MED FILL — Dexamethasone Sodium Phosphate Inj 100 MG/10ML: INTRAMUSCULAR | Qty: 1 | Status: AC

## 2022-12-23 ENCOUNTER — Other Ambulatory Visit: Payer: Self-pay

## 2022-12-23 ENCOUNTER — Inpatient Hospital Stay: Payer: Commercial Managed Care - PPO

## 2022-12-23 ENCOUNTER — Inpatient Hospital Stay: Payer: Commercial Managed Care - PPO | Attending: Oncology

## 2022-12-23 ENCOUNTER — Encounter: Payer: Self-pay | Admitting: Oncology

## 2022-12-23 ENCOUNTER — Inpatient Hospital Stay (HOSPITAL_BASED_OUTPATIENT_CLINIC_OR_DEPARTMENT_OTHER): Payer: Commercial Managed Care - PPO | Admitting: Oncology

## 2022-12-23 VITALS — BP 97/63 | HR 81 | Temp 98.0°F | Resp 18 | Wt 169.0 lb

## 2022-12-23 DIAGNOSIS — Z5111 Encounter for antineoplastic chemotherapy: Secondary | ICD-10-CM | POA: Diagnosis not present

## 2022-12-23 DIAGNOSIS — L270 Generalized skin eruption due to drugs and medicaments taken internally: Secondary | ICD-10-CM | POA: Diagnosis not present

## 2022-12-23 DIAGNOSIS — C50412 Malignant neoplasm of upper-outer quadrant of left female breast: Secondary | ICD-10-CM

## 2022-12-23 DIAGNOSIS — A6004 Herpesviral vulvovaginitis: Secondary | ICD-10-CM | POA: Insufficient documentation

## 2022-12-23 DIAGNOSIS — Z5189 Encounter for other specified aftercare: Secondary | ICD-10-CM | POA: Insufficient documentation

## 2022-12-23 DIAGNOSIS — T451X5A Adverse effect of antineoplastic and immunosuppressive drugs, initial encounter: Secondary | ICD-10-CM | POA: Insufficient documentation

## 2022-12-23 DIAGNOSIS — H6091 Unspecified otitis externa, right ear: Secondary | ICD-10-CM | POA: Insufficient documentation

## 2022-12-23 DIAGNOSIS — M26603 Bilateral temporomandibular joint disorder, unspecified: Secondary | ICD-10-CM | POA: Insufficient documentation

## 2022-12-23 DIAGNOSIS — Z170 Estrogen receptor positive status [ER+]: Secondary | ICD-10-CM | POA: Diagnosis not present

## 2022-12-23 DIAGNOSIS — N905 Atrophy of vulva: Secondary | ICD-10-CM | POA: Diagnosis not present

## 2022-12-23 DIAGNOSIS — L271 Localized skin eruption due to drugs and medicaments taken internally: Secondary | ICD-10-CM | POA: Diagnosis not present

## 2022-12-23 LAB — CBC WITH DIFFERENTIAL/PLATELET
Abs Immature Granulocytes: 0.07 10*3/uL (ref 0.00–0.07)
Basophils Absolute: 0.2 10*3/uL — ABNORMAL HIGH (ref 0.0–0.1)
Basophils Relative: 2 %
Eosinophils Absolute: 0.1 10*3/uL (ref 0.0–0.5)
Eosinophils Relative: 1 %
HCT: 37.1 % (ref 36.0–46.0)
Hemoglobin: 12.3 g/dL (ref 12.0–15.0)
Immature Granulocytes: 1 %
Lymphocytes Relative: 33 %
Lymphs Abs: 3.1 10*3/uL (ref 0.7–4.0)
MCH: 29.6 pg (ref 26.0–34.0)
MCHC: 33.2 g/dL (ref 30.0–36.0)
MCV: 89.4 fL (ref 80.0–100.0)
Monocytes Absolute: 1.3 10*3/uL — ABNORMAL HIGH (ref 0.1–1.0)
Monocytes Relative: 13 %
Neutro Abs: 4.8 10*3/uL (ref 1.7–7.7)
Neutrophils Relative %: 50 %
Platelets: 239 10*3/uL (ref 150–400)
RBC: 4.15 MIL/uL (ref 3.87–5.11)
RDW: 15.2 % (ref 11.5–15.5)
WBC: 9.5 10*3/uL (ref 4.0–10.5)
nRBC: 0 % (ref 0.0–0.2)

## 2022-12-23 LAB — COMPREHENSIVE METABOLIC PANEL
ALT: 32 U/L (ref 0–44)
AST: 25 U/L (ref 15–41)
Albumin: 3.4 g/dL — ABNORMAL LOW (ref 3.5–5.0)
Alkaline Phosphatase: 72 U/L (ref 38–126)
Anion gap: 8 (ref 5–15)
BUN: 12 mg/dL (ref 6–20)
CO2: 25 mmol/L (ref 22–32)
Calcium: 8.5 mg/dL — ABNORMAL LOW (ref 8.9–10.3)
Chloride: 106 mmol/L (ref 98–111)
Creatinine, Ser: 0.71 mg/dL (ref 0.44–1.00)
GFR, Estimated: >60 mL/min (ref >60)
Glucose, Bld: 112 mg/dL — ABNORMAL HIGH (ref 70–99)
Potassium: 3.7 mmol/L (ref 3.5–5.1)
Sodium: 139 mmol/L (ref 135–145)
Total Bilirubin: 0.3 mg/dL (ref 0.3–1.2)
Total Protein: 5.9 g/dL — ABNORMAL LOW (ref 6.5–8.1)

## 2022-12-23 MED ORDER — SODIUM CHLORIDE 0.9 % IV SOLN
75.0000 mg/m2 | Freq: Once | INTRAVENOUS | Status: AC
  Administered 2022-12-23: 140 mg via INTRAVENOUS
  Filled 2022-12-23: qty 14

## 2022-12-23 MED ORDER — PALONOSETRON HCL INJECTION 0.25 MG/5ML
0.2500 mg | Freq: Once | INTRAVENOUS | Status: AC
  Administered 2022-12-23: 0.25 mg via INTRAVENOUS

## 2022-12-23 MED ORDER — SODIUM CHLORIDE 0.9 % IV SOLN
Freq: Once | INTRAVENOUS | Status: AC
  Filled 2022-12-23: qty 250

## 2022-12-23 MED ORDER — SODIUM CHLORIDE 0.9 % IV SOLN
10.0000 mg | Freq: Once | INTRAVENOUS | Status: AC
  Administered 2022-12-23: 10 mg via INTRAVENOUS
  Filled 2022-12-23: qty 10

## 2022-12-23 MED ORDER — HEPARIN SOD (PORK) LOCK FLUSH 100 UNIT/ML IV SOLN
500.0000 [IU] | Freq: Once | INTRAVENOUS | Status: AC | PRN
  Administered 2022-12-23: 500 [IU]
  Filled 2022-12-23: qty 5

## 2022-12-23 MED ORDER — SODIUM CHLORIDE 0.9 % IV SOLN
600.0000 mg/m2 | Freq: Once | INTRAVENOUS | Status: AC
  Administered 2022-12-23: 1000 mg via INTRAVENOUS
  Filled 2022-12-23: qty 50

## 2022-12-23 NOTE — Patient Instructions (Signed)
[facility] [place]  Discharge Instructions: Thank you for choosing [facility] to provide your oncology and hematology care.  If you have a lab appointment with the [facility], please go directly to the [facility] and check in at the registration area.  Wear comfortable clothing and clothing appropriate for easy access to any Portacath or PICC line.   We strive to give you quality time with your provider. You may need to reschedule your appointment if you arrive late (15 or more minutes).  Arriving late affects you and other patients whose appointments are after yours.  Also, if you miss three or more appointments without notifying the office, you may be dismissed from the clinic at the provider's discretion.      For prescription refill requests, have your pharmacy contact our office and allow 72 hours for refills to be completed.    Today you received the following chemotherapy and/or immunotherapy agents Taxotere and Cytoxan       To help prevent nausea and vomiting after your treatment, we encourage you to take your nausea medication as directed.  BELOW ARE SYMPTOMS THAT SHOULD BE REPORTED IMMEDIATELY: *FEVER GREATER THAN 100.4 F (38 C) OR HIGHER *CHILLS OR SWEATING *NAUSEA AND VOMITING THAT IS NOT CONTROLLED WITH YOUR NAUSEA MEDICATION *UNUSUAL SHORTNESS OF BREATH *UNUSUAL BRUISING OR BLEEDING *URINARY PROBLEMS (pain or burning when urinating, or frequent urination) *BOWEL PROBLEMS (unusual diarrhea, constipation, pain near the anus) TENDERNESS IN MOUTH AND THROAT WITH OR WITHOUT PRESENCE OF ULCERS (sore throat, sores in mouth, or a toothache) UNUSUAL RASH, SWELLING OR PAIN  UNUSUAL VAGINAL DISCHARGE OR ITCHING   Items with * indicate a potential emergency and should be followed up as soon as possible or go to the Emergency Department if any problems should occur.  Please show the CHEMOTHERAPY ALERT CARD or IMMUNOTHERAPY ALERT CARD at  check-in to the Emergency Department and triage nurse.  Should you have questions after your visit or need to cancel or reschedule your appointment, please contact [facility] [place] [facility] [place]  [phone] and follow the prompts.  Office hours are 8:00 a.m. to 4:30 p.m. Monday - Friday. Please note that voicemails left after 4:00 p.m. may not be returned until the following business day.  We are closed weekends and major holidays. You have access to a nurse at all times for urgent questions. Please call the main number to the clinic [phone] and follow the prompts.  For any non-urgent questions, you may also contact your provider using MyChart. We now offer e-Visits for anyone 54 and older to request care online for non-urgent symptoms. For details visit [email or web address].   Also download the MyChart app! Go to the app store, search "MyChart", open the app, select [facility], and log in with your MyChart username and password.

## 2022-12-23 NOTE — Progress Notes (Signed)
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  This was followed by a diagnostic mammogram and ultrasound which showed multiple benign cysts noted at the 1 o'clock position.  At the 2 o'clock position 3 cm from the nipple was a oval hypoechoic mass measuring 5 x 4 x 3 mm.  No suspicious left axillary adenopathy.  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   Final pathology showed grade two 7 mm tumor with negative margins.  1 sentinel lymph node negative for malignancy.  Oncotype results showed a recurrence score of 28 and absolute benefit of chemotherapy at greater than 15%.  Interval history-patient was on Decadron for a week after chemotherapy to prevent her from getting a skin rash.  She did very well with that regimen.  She also had a bout of URI a week ago which has resolved since then.  ECOG PS- 0 Pain scale- 0   Review of systems- Review of Systems  Constitutional:  Positive for malaise/fatigue. Negative for chills, fever and weight loss.  HENT:  Negative for congestion, ear discharge and nosebleeds.   Eyes:  Negative for blurred  vision.  Respiratory:  Negative for cough, hemoptysis, sputum production, shortness of breath and wheezing.   Cardiovascular:  Negative for chest pain, palpitations, orthopnea and claudication.  Gastrointestinal:  Negative for abdominal pain, blood in stool, constipation, diarrhea, heartburn, melena, nausea and vomiting.  Genitourinary:  Negative for dysuria, flank pain, frequency, hematuria and urgency.  Musculoskeletal:  Negative for back pain, joint pain and myalgias.  Skin:  Negative for rash.  Neurological:  Negative for dizziness, tingling, focal weakness, seizures, weakness and headaches.  Endo/Heme/Allergies:  Does not bruise/bleed easily.  Psychiatric/Behavioral:  Negative for depression and suicidal ideas. The patient does not have insomnia.       Allergies  Allergen Reactions   Cymbalta [Duloxetine Hcl] Other (See Comments)    Hallucinations; sleep walking   Nortriptyline Nausea And Vomiting   Phenergan [Promethazine Hcl]     Restless legs and cramping     Past Medical History:  Diagnosis Date   Anxiety    Bronchitis    Cancer ([place]) 09/2022   left breast IMC   Complication of anesthesia    PONV   Contusion of leg 10/06/2015   Depression    GERD (gastroesophageal reflux disease)    Insomnia    Migraine headache    Pneumonia    2000   PONV (postoperative nausea and vomiting)      Past Surgical History:  Procedure Laterality Date   ABDOMINAL HYSTERECTOMY     BALLOON DILATION N/A 12/14/2020   Procedure: BALLOON DILATION;  Surgeon: Eloise Harman, DO;  [facility];  Service: Endoscopy;  Laterality: N/A;   BIOPSY  12/14/2020   Procedure: BIOPSY;  Surgeon: Eloise Harman, DO;  [facility];  Service: Endoscopy;;   BREAST BIOPSY Left 08/24/2022   [place] bx, coil marker path pending   BREAST LUMPECTOMY WITH RADIOACTIVE SEED AND SENTINEL LYMPH NODE BIOPSY Left 09/29/2022   Procedure: LEFT BREAST LUMPECTOMY WITH RADIOACTIVE SEED AND  AXILLARY SENTINEL LYMPH NODE BIOPSY;  Surgeon: Rolm Bookbinder, MD;  Location: [place];  Service: General;  Laterality: Left;   CESAREAN SECTION     CHOLECYSTECTOMY  1988   COLONOSCOPY N/A 12/10/2018    six 4-8 mm polyps in proximal descending colon, mid transverse and hepatic flexure. Diverticulosis. External and internal hemorrhoids. Torturous colon. Two simple adenomas, two serrated polyps, 2 hyperplastic polyps. Surveillance due Dec 2022.    ESOPHAGOGASTRODUODENOSCOPY (EGD) WITH PROPOFOL N/A 12/14/2020   Benign-appearing esophageal stenosis s/p dilation, gastritis, normal duodenum. Mild chronic gastritis.    open heart surgery  1967   PATENT DUCTUS ARTERIOUS REPAIR  1967   POLYPECTOMY  12/10/2018   Procedure: POLYPECTOMY;  Surgeon: Danie Binder, MD;  [facility];  Service: Endoscopy;;  hepatic flexure, descending, transverse   PORTACATH PLACEMENT N/A 11/14/2022   Procedure: PORT PLACEMENT WITH ULTRASOUND GUIDANCE;  Surgeon: Rolm Bookbinder, MD;  Location: [place];  Service: General;  Laterality: N/A;   SHOULDER ARTHROSCOPY Left 06/16/2017   Procedure: LEFT SHOULDER ARTHROSCOPY, DEBRIDEMENT, AND DECOMPRESSION;  Surgeon: Newt Minion, MD;  Location: [place];  Service: Orthopedics;  Laterality: Left;   SHOULDER ARTHROSCOPY WITH SUBACROMIAL DECOMPRESSION Left 10/03/2019   Procedure: SHOULDER ARTHROSCOPY WITH DEBRIDEMENT, DECOMPRESSION;  Surgeon: Corky Mull, MD;  Location: [facility];  Service: Orthopedics;  Laterality: Left;   SHOULDER SURGERY Left 2008   [facility]    Social History   Socioeconomic History   Marital status: Married    Spouse name: daniel   Number of children: 4   Years of education: Not on file   Highest education level: Some college, no degree  Occupational History   Not on file  Tobacco Use   Smoking status: Never   Smokeless tobacco: Never  Vaping Use   Vaping Use: Never used  Substance and Sexual Activity    Alcohol use: Not Currently    Comment: occassionally-not since chemo started   Drug use: No   Sexual activity: Yes    Birth control/protection: Surgical    Comment: hyst  Other Topics Concern   Not on file  Social History Narrative   Not on file   Social Determinants of Health   Financial Resource Strain: Low Risk  (07/12/2022)   Overall Financial Resource Strain (CARDIA)    Difficulty of Paying Living Expenses: Not hard at all  Food Insecurity: No Food Insecurity (07/12/2022)   Hunger Vital Sign    Worried About Running Out of Food in the Last Year: Never true    [place] in the Last Year: Never true  Transportation Needs: No Transportation Needs (07/12/2022)   PRAPARE - Hydrologist (Medical): No    Lack of Transportation (Non-Medical): No  Physical Activity: Insufficiently Active (07/12/2022)   Exercise Vital Sign    Days of Exercise per Week: 3 days    Minutes of Exercise per Session: 30 min  Stress: Stress Concern Present (07/12/2022)   [place]  Feeling of Stress : To some extent  Social Connections: Moderately Integrated (07/12/2022)   Social Connection and Isolation Panel [NHANES]    Frequency of Communication with Friends and Family: Once a week    Frequency of Social Gatherings with Friends and Family: Once a week    Attends Religious Services: More than 4 times per year    Active Member of [employer] or Organizations: Yes    Attends Music therapist: More than 4 times per year    Marital Status: Married  Human resources officer Violence: Not At Risk (07/12/2022)   Humiliation, Afraid, Rape, and Kick questionnaire    Fear of Current or Ex-Partner: No    Emotionally Abused: No    Physically Abused: No    Sexually Abused: No    Family History  Problem Relation Age of Onset   Breast cancer Mother 3   Hypertension Father    Colon cancer Paternal Uncle        dx 43s    Hypertension Maternal Grandmother    Diabetes Maternal Grandmother    Heart disease Maternal Grandmother 73       MI   Hypertension Maternal Grandfather    Throat cancer Maternal Grandfather 83     Current Outpatient Medications:    acetaminophen (TYLENOL) 500 MG tablet, Take 500 mg by mouth every 6 (six) hours as needed for moderate pain., Disp: , Rfl:    betamethasone dipropionate 0.05 % cream, Apply topically 2 (two) times daily., Disp: 30 g, Rfl: 0   CALCIUM PO, Take 1 capsule by mouth daily., Disp: , Rfl:    famotidine (PEPCID) 20 MG tablet, Take 20 mg by mouth 2 (two) times daily., Disp: , Rfl:    fluticasone (FLONASE) 50 MCG/ACT nasal spray, Place 1 spray into both nostrils daily as needed for allergies or rhinitis., Disp: , Rfl:    lidocaine-prilocaine (EMLA) cream, Apply to affected area once, Disp: 30 g, Rfl: 3   Lifitegrast (XIIDRA) 5 % SOLN, Instill 1 drop into both eyes twice a day, Disp: 180 each, Rfl: 4   nystatin (MYCOSTATIN) 100000 UNIT/ML suspension, Take 5 mLs (500,000 Units total) by mouth 4 (four) times daily. Swish and spit, Disp: 120 mL, Rfl: 0   ondansetron (ZOFRAN-ODT) 4 MG disintegrating tablet, Take 1 tablet (4 mg total) by mouth every 8 (eight) hours as needed for Nausea migraine, Disp: 20 tablet, Rfl: 11   pantoprazole (PROTONIX) 20 MG tablet, Take 1 tablet (20 mg total) by mouth daily., Disp: 30 tablet, Rfl: 3   predniSONE (DELTASONE) 5 MG tablet, Take 4 tablets (20 mg total) by mouth daily with breakfast., Disp: 40 tablet, Rfl: 2   Probiotic Product (PROBIOTIC PO), Take 1 capsule by mouth daily., Disp: , Rfl:    rizatriptan (MAXALT-MLT) 10 MG disintegrating tablet, Take 1 tablet (10 mg total) by mouth once as needed (may repeat dose once in > 2 hrs) for up to 1 dose, Disp: 10 tablet, Rfl: 11   topiramate (TOPAMAX) 25 MG tablet, Take 1 tablet (25 mg total) by mouth 2 (two) times daily, Disp: 60 tablet, Rfl: 11   traMADol (ULTRAM) 50 MG tablet, Take 1 tablet  (50 mg total) by mouth every 6 (six) hours as needed., Disp: 10 tablet, Rfl: 0   traZODone (DESYREL) 50 MG tablet, Take 1 tablet (50 mg total) by mouth at bedtime as needed for sleep., Disp: 90 tablet, Rfl: 1 No current facility-administered medications for this visit.  Facility-Administered Medications Ordered in Other  Visits:    cyclophosphamide (CYTOXAN) 1,000 mg in sodium chloride 0.9 % 250 mL chemo infusion, 600 mg/m2 (Treatment Plan Recorded), Intravenous, Once, Sindy Guadeloupe, MD   DOCEtaxel (TAXOTERE) 140 mg in sodium chloride 0.9 % 250 mL chemo infusion, 75 mg/m2 (Treatment Plan Recorded), Intravenous, Once, Sindy Guadeloupe, MD, Last Rate: 264 mL/hr at 12/23/22 1120, 140 mg at 12/23/22 1120   heparin lock flush 100 unit/mL, 500 Units, Intracatheter, Once PRN, Sindy Guadeloupe, MD  Physical exam:  Vitals:   12/23/22 0940  BP: 97/63  Pulse: 81  Resp: 18  Temp: 98 F (36.7 C)  SpO2: 98%  Weight: 169 lb (76.7 kg)   Physical Exam Cardiovascular:     Rate and Rhythm: Normal rate and regular rhythm.     Heart sounds: Normal heart sounds.  Pulmonary:     Effort: Pulmonary effort is normal.     Breath sounds: Normal breath sounds.  Skin:    General: Skin is warm and dry.  Neurological:     Mental Status: She is alert and oriented to person, place, and time.         Latest Ref Rng & Units 12/23/2022    8:33 AM  CMP  Glucose 70 - 99 mg/dL 112   BUN 6 - 20 mg/dL 12   Creatinine 0.44 - 1.00 mg/dL 0.71   Sodium 135 - 145 mmol/L 139   Potassium 3.5 - 5.1 mmol/L 3.7   Chloride 98 - 111 mmol/L 106   CO2 22 - 32 mmol/L 25   Calcium 8.9 - 10.3 mg/dL 8.5   Total Protein 6.5 - 8.1 g/dL 5.9   Total Bilirubin 0.3 - 1.2 mg/dL 0.3   Alkaline Phos 38 - 126 U/L 72   AST 15 - 41 U/L 25   ALT 0 - 44 U/L 32       Latest Ref Rng & Units 12/23/2022    8:33 AM  CBC  WBC 4.0 - 10.5 K/uL 9.5   Hemoglobin 12.0 - 15.0 g/dL 12.3   Hematocrit 36.0 - 46.0 % 37.1   Platelets 150 - 400 K/uL 239       Assessment and plan- Patient is a 57 y.o. female  with history of pathological prognostic stage Ia invasive mammary carcinoma of the left breast pT1b N0 M0 ER greater than 90% positive, PR 31 to 40% positive and HER2 negative.  She is here for on treatment assessment prior to cycle 3 of adjuvant TC chemotherapy   Counts okay to proceed with cycle 3 of adjuvant TC chemotherapy today with the Udenyca on day 3.  I will see her back in 3 weeks for cycle 4 which will be her last cycle.  We will also inform radiation oncology so that they can start planning her adjuvant radiation accordingly.  Drug-induced skin rash: Likely secondary to Taxotere.  She will take prednisone 20 mg for 5 days starting tomorrow.  The same regimen worked for her for the last cycle as well.  Continue proton    Visit Diagnosis 1. Encounter for antineoplastic chemotherapy   2. Malignant neoplasm of upper-outer quadrant of left breast in female, estrogen receptor positive ([place])   3. Drug-induced skin rash      Dr. Randa Evens, MD, MPH [facility] [phone] 12/23/2022 11:46 AM

## 2022-12-23 NOTE — Progress Notes (Signed)
Patient here for oncology follow-up appointment, expresses no complaints or concerns at this time.    

## 2022-12-26 ENCOUNTER — Other Ambulatory Visit: Payer: Self-pay

## 2022-12-26 ENCOUNTER — Inpatient Hospital Stay: Payer: Commercial Managed Care - PPO

## 2022-12-26 DIAGNOSIS — Z170 Estrogen receptor positive status [ER+]: Secondary | ICD-10-CM

## 2022-12-26 DIAGNOSIS — H6091 Unspecified otitis externa, right ear: Secondary | ICD-10-CM | POA: Diagnosis not present

## 2022-12-26 DIAGNOSIS — C50412 Malignant neoplasm of upper-outer quadrant of left female breast: Secondary | ICD-10-CM | POA: Diagnosis not present

## 2022-12-26 DIAGNOSIS — A6004 Herpesviral vulvovaginitis: Secondary | ICD-10-CM | POA: Diagnosis not present

## 2022-12-26 DIAGNOSIS — M26603 Bilateral temporomandibular joint disorder, unspecified: Secondary | ICD-10-CM | POA: Diagnosis not present

## 2022-12-26 DIAGNOSIS — Z5111 Encounter for antineoplastic chemotherapy: Secondary | ICD-10-CM | POA: Diagnosis not present

## 2022-12-26 DIAGNOSIS — L270 Generalized skin eruption due to drugs and medicaments taken internally: Secondary | ICD-10-CM | POA: Diagnosis not present

## 2022-12-26 DIAGNOSIS — T451X5A Adverse effect of antineoplastic and immunosuppressive drugs, initial encounter: Secondary | ICD-10-CM | POA: Diagnosis not present

## 2022-12-26 DIAGNOSIS — L271 Localized skin eruption due to drugs and medicaments taken internally: Secondary | ICD-10-CM | POA: Diagnosis not present

## 2022-12-26 DIAGNOSIS — N905 Atrophy of vulva: Secondary | ICD-10-CM | POA: Diagnosis not present

## 2022-12-26 MED ORDER — PEGFILGRASTIM-JMDB 6 MG/0.6ML ~~LOC~~ SOSY
6.0000 mg | PREFILLED_SYRINGE | Freq: Once | SUBCUTANEOUS | Status: AC
  Administered 2022-12-26: 6 mg via SUBCUTANEOUS
  Filled 2022-12-26: qty 0.6

## 2022-12-29 ENCOUNTER — Telehealth: Payer: Self-pay | Admitting: *Deleted

## 2022-12-29 ENCOUNTER — Ambulatory Visit (INDEPENDENT_AMBULATORY_CARE_PROVIDER_SITE_OTHER): Payer: Commercial Managed Care - PPO | Admitting: Licensed Clinical Social Worker

## 2022-12-29 ENCOUNTER — Encounter (HOSPITAL_COMMUNITY): Payer: Self-pay

## 2022-12-29 DIAGNOSIS — F4310 Post-traumatic stress disorder, unspecified: Secondary | ICD-10-CM | POA: Diagnosis not present

## 2022-12-29 NOTE — Telephone Encounter (Signed)
Received Disability Form. Form completed and sent for physician signature

## 2022-12-30 ENCOUNTER — Encounter: Payer: Self-pay | Admitting: Oncology

## 2022-12-30 ENCOUNTER — Inpatient Hospital Stay (HOSPITAL_BASED_OUTPATIENT_CLINIC_OR_DEPARTMENT_OTHER): Payer: Commercial Managed Care - PPO | Admitting: Medical Oncology

## 2022-12-30 ENCOUNTER — Inpatient Hospital Stay: Payer: Commercial Managed Care - PPO | Admitting: Nurse Practitioner

## 2022-12-30 ENCOUNTER — Other Ambulatory Visit: Payer: Self-pay

## 2022-12-30 ENCOUNTER — Encounter: Payer: Self-pay | Admitting: *Deleted

## 2022-12-30 VITALS — BP 111/73 | HR 98 | Resp 18 | Wt 169.4 lb

## 2022-12-30 DIAGNOSIS — M26623 Arthralgia of bilateral temporomandibular joint: Secondary | ICD-10-CM

## 2022-12-30 DIAGNOSIS — L271 Localized skin eruption due to drugs and medicaments taken internally: Secondary | ICD-10-CM | POA: Diagnosis not present

## 2022-12-30 DIAGNOSIS — Z170 Estrogen receptor positive status [ER+]: Secondary | ICD-10-CM | POA: Diagnosis not present

## 2022-12-30 DIAGNOSIS — A6004 Herpesviral vulvovaginitis: Secondary | ICD-10-CM | POA: Diagnosis not present

## 2022-12-30 DIAGNOSIS — C50412 Malignant neoplasm of upper-outer quadrant of left female breast: Secondary | ICD-10-CM | POA: Diagnosis not present

## 2022-12-30 DIAGNOSIS — H6593 Unspecified nonsuppurative otitis media, bilateral: Secondary | ICD-10-CM

## 2022-12-30 DIAGNOSIS — M26603 Bilateral temporomandibular joint disorder, unspecified: Secondary | ICD-10-CM | POA: Diagnosis not present

## 2022-12-30 DIAGNOSIS — T451X5A Adverse effect of antineoplastic and immunosuppressive drugs, initial encounter: Secondary | ICD-10-CM | POA: Diagnosis not present

## 2022-12-30 DIAGNOSIS — Z5111 Encounter for antineoplastic chemotherapy: Secondary | ICD-10-CM | POA: Diagnosis not present

## 2022-12-30 DIAGNOSIS — N905 Atrophy of vulva: Secondary | ICD-10-CM | POA: Diagnosis not present

## 2022-12-30 DIAGNOSIS — H6091 Unspecified otitis externa, right ear: Secondary | ICD-10-CM | POA: Diagnosis not present

## 2022-12-30 DIAGNOSIS — H608X1 Other otitis externa, right ear: Secondary | ICD-10-CM

## 2022-12-30 DIAGNOSIS — L270 Generalized skin eruption due to drugs and medicaments taken internally: Secondary | ICD-10-CM | POA: Diagnosis not present

## 2022-12-30 MED ORDER — NEOMYCIN-POLYMYXIN-HC 3.5-10000-1 OT SUSP
3.0000 [drp] | Freq: Four times a day (QID) | OTIC | 0 refills | Status: DC
  Filled 2022-12-30: qty 10, 17d supply, fill #0

## 2022-12-30 NOTE — Telephone Encounter (Signed)
Form signed and faxed per patient request. Patient notified while speaking to Casper Harrison, RN

## 2022-12-30 NOTE — Progress Notes (Signed)
Nichole Brown called this morning with some ongoing symptoms.   She wanted to know if she should continue prednisone longer for her rash that is still persistent.   She is also is having some ear pain and feels like she may be getting an ear infection.   Per Dr. Janese Banks continue prednisone for 3-4 more days and she needs to have her ear checked.   She is going to be seen in [facility] this morning.

## 2022-12-30 NOTE — Progress Notes (Signed)
   THERAPIST PROGRESS NOTE  Session Time: 4-5p  [place] in office visit for patient and LCSW clinician  Participation Level: Active  Behavioral Response: Neat and Well GroomedAlertAnxious and Euthymic  Type of Therapy: Individual Therapy  Treatment Goals addressed: Reduce frequency, intensity, and duration of PTSD symptoms so daily functioning is improved: Input needed on appropriate metric. Pt self report.    ProgressTowards Goals: Progressing  Interventions: CBT and Other: trauma focused  Summary: Nichole Brown is a 57 y.o. female who presents with continuing symptoms related to PTSD/anxiety.  Allowed pt to explore and express thoughts and feelings associated with recent life situations and external stressors. Explored pts thoughts and feelings associated with ongoing breast cancer treatments. Explored supports, self care, and relationships with family members. Explored overall psychological impact of cancer and cancer treatment.   Pt reports that the entire experience has changed her mindset and worldview both spiritually and emotionally. "I don't let the little things bother me that uses to bother me a lot".    Continued recommendations are as follows: self care behaviors, positive social engagements, focusing on overall work/home/life balance, and focusing on positive physical and emotional wellness.   Suicidal/Homicidal: No  Therapist Response: Pt is continuing to apply interventions learned in session into daily life situations. Pt is currently on track to meet goals utilizing interventions mentioned above. Personal growth and progress noted. Treatment to continue as indicated.   Pt presents with very positive affect and attitude towards overall health related concerns.  Plan: Return again in 4 weeks.  Diagnosis:  Encounter Diagnosis  Name Primary?   PTSD (post-traumatic stress disorder) Yes   Collaboration of Care: Other Pt encouraged  to continue psychiatric care with Dr. Modesta Messing  Patient/Guardian was advised Release of Information must be obtained prior to any record release in order to collaborate their care with an outside provider. Patient/Guardian was advised if they have not already done so to contact the registration department to sign all necessary forms in order for [place] to release information regarding their care.   Consent: Patient/Guardian gives verbal consent for treatment and assignment of benefits for services provided during this visit. Patient/Guardian expressed understanding and agreed to proceed.   [place], LCSW 12/30/2022

## 2022-12-30 NOTE — Progress Notes (Signed)
Patient has bilateral ear pain and eye pressure that started this week.  No other complaints.

## 2022-12-30 NOTE — Progress Notes (Signed)
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 female who presents today for [facility] visit for:  Ear Pain: Patient states that over the past few days her ears have felt a bit full. No decreased hearing, pain or discharge. Last night she forgot to wear her dental nightguard for which she uses to prevent her from grinding her teeth and awoke with dental pain throughout as well as ear pain of the right side. She tried to use a Q-tip this morning which was very painful on the right side. No bleeding/discharge. She has not tried anything for symptoms.   Denies any neurologic complaints. Denies recent fevers or illnesses. Denies any easy bleeding or bruising. Reports good appetite and denies weight loss. Denies chest pain. Denies any nausea, vomiting, constipation, or diarrhea. Denies urinary complaints. Patient offers no further specific complaints today.   PAST MEDICAL HISTORY: Past Medical History:  Diagnosis Date   Anxiety    Bronchitis    Cancer ([place]) 09/2022   left breast IMC   Complication of anesthesia    PONV   Contusion of leg 10/06/2015   Depression    GERD (gastroesophageal reflux disease)    Insomnia    Migraine headache    Pneumonia    2000   PONV (postoperative nausea and vomiting)     PAST SURGICAL HISTORY:  Past Surgical History:  Procedure Laterality Date   ABDOMINAL HYSTERECTOMY      BALLOON DILATION N/A 12/14/2020   Procedure: BALLOON DILATION;  Surgeon: Eloise Harman, DO;  [facility];  Service: Endoscopy;  Laterality: N/A;   BIOPSY  12/14/2020   Procedure: BIOPSY;  Surgeon: Eloise Harman, DO;  [facility];  Service: Endoscopy;;   BREAST BIOPSY Left 08/24/2022   [place] bx, coil marker path pending   BREAST LUMPECTOMY WITH RADIOACTIVE SEED AND SENTINEL LYMPH NODE BIOPSY Left 09/29/2022   Procedure: LEFT BREAST LUMPECTOMY WITH RADIOACTIVE SEED AND AXILLARY SENTINEL LYMPH NODE BIOPSY;  Surgeon: Rolm Bookbinder, MD;  Location: [place];  Service: General;  Laterality: Left;   CESAREAN SECTION     CHOLECYSTECTOMY  1988   COLONOSCOPY N/A 12/10/2018    six 4-8 mm polyps in proximal descending colon, mid transverse and hepatic flexure. Diverticulosis. External and internal hemorrhoids. Torturous colon. Two simple adenomas, two serrated polyps, 2 hyperplastic polyps. Surveillance due Dec 2022.    ESOPHAGOGASTRODUODENOSCOPY (EGD) WITH PROPOFOL N/A 12/14/2020   Benign-appearing esophageal stenosis s/p dilation, gastritis, normal duodenum. Mild chronic gastritis.    open heart surgery  1967   PATENT DUCTUS ARTERIOUS REPAIR  1967   POLYPECTOMY  12/10/2018   Procedure: POLYPECTOMY;  Surgeon: Danie Binder, MD;  [facility];  Service: Endoscopy;;  hepatic flexure, descending, transverse   PORTACATH PLACEMENT N/A 11/14/2022   Procedure: PORT PLACEMENT WITH ULTRASOUND GUIDANCE;  Surgeon: Rolm Bookbinder, MD;  Location: [place];  Service:  General;  Laterality: N/A;   SHOULDER ARTHROSCOPY Left 06/16/2017   Procedure: LEFT SHOULDER ARTHROSCOPY, DEBRIDEMENT, AND DECOMPRESSION;  Surgeon: Newt Minion, MD;  Location: [place];  Service: Orthopedics;  Laterality: Left;   SHOULDER ARTHROSCOPY WITH SUBACROMIAL DECOMPRESSION Left 10/03/2019   Procedure: SHOULDER ARTHROSCOPY WITH DEBRIDEMENT, DECOMPRESSION;   Surgeon: Corky Mull, MD;  Location: [facility];  Service: Orthopedics;  Laterality: Left;   SHOULDER SURGERY Left 2008   [facility] joint    HEMATOLOGY/ONCOLOGY HISTORY:  Oncology History  Malignant neoplasm of upper-outer quadrant of left breast in female, estrogen receptor positive ([place])  08/29/2022 Initial Diagnosis   Malignant neoplasm of upper-outer quadrant of left breast in female, estrogen receptor positive ([place])   08/29/2022 Cancer Staging   Staging form: Breast, AJCC 8th Edition - Clinical stage from 08/29/2022: Stage IA (cT1b, cN0, cM0, G1, ER+, PR+, HER2-) - Signed by Sindy Guadeloupe, MD on 08/29/2022 Stage prefix: Initial diagnosis Histologic grading system: 3 grade system    Genetic Testing   Negative genetic testing. No pathogenic variants identified on the [facility] CancerNext-Expanded+RNA panel. The report date is 09/16/2022.  The CancerNext-Expanded + RNAinsight gene panel offered by [employer] and includes sequencing and rearrangement analysis for the following 77 genes: IP, ALK, APC*, ATM*, AXIN2, BAP1, BARD1, BLM, BMPR1A, BRCA1*, BRCA2*, BRIP1*, CDC73, CDH1*,CDK4, CDKN1B, CDKN2A, CHEK2*, CTNNA1, DICER1, FANCC, FH, FLCN, GALNT12, KIF1B, LZTR1, MAX, MEN1, MET, MLH1*, MSH2*, MSH3, MSH6*, MUTYH*, NBN, NF1*, NF2, NTHL1, PALB2*, PHOX2B, PMS2*, POT1, PRKAR1A, PTCH1, PTEN*, RAD51C*, RAD51D*,RB1, RECQL, RET, SDHA, SDHAF2, SDHB, SDHC, SDHD, SMAD4, SMARCA4, SMARCB1, SMARCE1, STK11, SUFU, TMEM127, TP53*,TSC1, TSC2, VHL and XRCC2 (sequencing and deletion/duplication); EGFR, EGLN1, HOXB13, KIT, MITF, PDGFRA, POLD1 and POLE (sequencing only); EPCAM and GREM1 (deletion/duplication only).   10/07/2022 Cancer Staging   Staging form: Breast, AJCC 8th Edition - Pathologic stage from 10/07/2022: Stage IA (pT1b, pN0, cM0, G2, ER+, PR+, HER2-) - Signed by Sindy Guadeloupe, MD on 10/07/2022 Multigene prognostic tests performed: Oncotype DX Histologic grading system: 3 grade system   11/11/2022 -   Chemotherapy   Patient is on Treatment Plan : BREAST TC q21d       ALLERGIES:  is allergic to cymbalta [duloxetine hcl], nortriptyline, and phenergan [promethazine hcl].  MEDICATIONS:  Current Outpatient Medications  Medication Sig Dispense Refill   neomycin-polymyxin-hydrocortisone (CORTISPORIN) 3.5-10000-1 OTIC suspension Place 3 drops into the right ear 4 (four) times daily. 10 mL 0   acetaminophen (TYLENOL) 500 MG tablet Take 500 mg by mouth every 6 (six) hours as needed for moderate pain.     betamethasone dipropionate 0.05 % cream Apply topically 2 (two) times daily. 30 g 0   CALCIUM PO Take 1 capsule by mouth daily.     famotidine (PEPCID) 20 MG tablet Take 20 mg by mouth 2 (two) times daily.     fluticasone (FLONASE) 50 MCG/ACT nasal spray Place 1 spray into both nostrils daily as needed for allergies or rhinitis.     lidocaine-prilocaine (EMLA) cream Apply to affected area once 30 g 3   Lifitegrast (XIIDRA) 5 % SOLN Instill 1 drop into both eyes twice a day 180 each 4   nystatin (MYCOSTATIN) 100000 UNIT/ML suspension Take 5 mLs (500,000 Units total) by mouth 4 (four) times daily. Swish and spit 120 mL 0   ondansetron (ZOFRAN-ODT) 4 MG disintegrating tablet Take 1 tablet (4 mg total) by mouth every 8 (eight) hours as needed for Nausea migraine 20 tablet 11   pantoprazole (PROTONIX) 20 MG tablet Take  1 tablet (20 mg total) by mouth daily. 30 tablet 3   predniSONE (DELTASONE) 5 MG tablet Take 4 tablets (20 mg total) by mouth daily with breakfast. 40 tablet 2   Probiotic Product (PROBIOTIC PO) Take 1 capsule by mouth daily.     rizatriptan (MAXALT-MLT) 10 MG disintegrating tablet Take 1 tablet (10 mg total) by mouth once as needed (may repeat dose once in > 2 hrs) for up to 1 dose 10 tablet 11   topiramate (TOPAMAX) 25 MG tablet Take 1 tablet (25 mg total) by mouth 2 (two) times daily 60 tablet 11   traMADol (ULTRAM) 50 MG tablet Take 1 tablet (50 mg total) by mouth every 6 (six) hours  as needed. 10 tablet 0   traZODone (DESYREL) 50 MG tablet Take 1 tablet (50 mg total) by mouth at bedtime as needed for sleep. 90 tablet 1   No current facility-administered medications for this visit.    VITAL SIGNS: BP 111/73 (BP Location: Right Arm, Patient Position: Sitting)   Pulse 98   Resp 18   Wt 169 lb 6.4 oz (76.8 kg)   BMI 30.98 kg/m  Filed Weights   12/30/22 1100  Weight: 169 lb 6.4 oz (76.8 kg)    Estimated body mass index is 30.98 kg/m as calculated from the following:   Height as of 12/02/22: '5\' 2"'$  (1.575 m).   Weight as of this encounter: 169 lb 6.4 oz (76.8 kg).  LABS: CBC:    Component Value Date/Time   WBC 9.5 12/23/2022 0833   HGB 12.3 12/23/2022 0833   HGB 12.9 09/22/2014 0615   HCT 37.1 12/23/2022 0833   HCT 40.7 09/22/2014 0615   PLT 239 12/23/2022 0833   PLT 223 09/22/2014 0615   MCV 89.4 12/23/2022 0833   MCV 90 09/22/2014 0615   NEUTROABS 4.8 12/23/2022 0833   NEUTROABS 3.2 09/22/2014 0615   LYMPHSABS 3.1 12/23/2022 0833   LYMPHSABS 3.6 09/22/2014 0615   MONOABS 1.3 (H) 12/23/2022 0833   MONOABS 0.8 09/22/2014 0615   EOSABS 0.1 12/23/2022 0833   EOSABS 0.4 09/22/2014 0615   BASOSABS 0.2 (H) 12/23/2022 0833   BASOSABS 0.1 09/22/2014 0615   Comprehensive Metabolic Panel:    Component Value Date/Time   NA 139 12/23/2022 0833   NA 143 09/22/2014 0615   K 3.7 12/23/2022 0833   K 4.0 09/22/2014 0615   CL 106 12/23/2022 0833   CL 108 (H) 09/22/2014 0615   CO2 25 12/23/2022 0833   CO2 28 09/22/2014 0615   BUN 12 12/23/2022 0833   BUN 12 09/22/2014 0615   CREATININE 0.71 12/23/2022 0833   CREATININE 0.65 09/22/2014 0615   GLUCOSE 112 (H) 12/23/2022 0833   GLUCOSE 84 09/22/2014 0615   CALCIUM 8.5 (L) 12/23/2022 0833   CALCIUM 8.4 (L) 09/22/2014 0615   AST 25 12/23/2022 0833   AST 26 09/22/2014 0615   ALT 32 12/23/2022 0833   ALT 36 09/22/2014 0615   ALKPHOS 72 12/23/2022 0833   ALKPHOS 83 09/22/2014 0615   BILITOT 0.3 12/23/2022  0833   BILITOT 0.2 09/22/2014 0615   PROT 5.9 (L) 12/23/2022 0833   PROT 6.7 09/22/2014 0615   ALBUMIN 3.4 (L) 12/23/2022 0833   ALBUMIN 3.5 09/22/2014 0615    RADIOGRAPHIC STUDIES: No results found.  PERFORMANCE STATUS (ECOG) : 1 - Symptomatic but completely ambulatory  Review of Systems Unless otherwise noted, a complete review of systems is negative.  Physical Exam General: NAD HEENT:  Left external ear without abnormality or tenderness of pina. No erythema of external canal. Mild clear middle ear effusion of inner ear. No discharge or excessive/impacted cerumen. Right external ear without abnormality or tenderness of pina. Mild edema of right external canal with scant white debris. Mild clear middle ear effusion of inner ear. No erythema or evidence of abscess of teeth. Scant tenderness of the jaw with mild tenderness bilaterally in the area of her temporomandibular joints.  Cardiovascular: regular rate and rhythm Pulmonary: clear ant fields Abdomen: soft, nontender, + bowel sounds GU: no suprapubic tenderness Extremities: no edema, no joint deformities Skin: no rashes Neurological: Weakness but otherwise nonfocal  Assessment and Plan- Patient is a 57 y.o. female    Encounter Diagnoses  Name Primary?   TMJ tenderness, bilateral    Other otitis externa, right ear Yes   Middle ear effusion, bilateral     Ear pain is new. Appears multifactorial: TMJ, otitis externa. She is not on any bisphosphonate medication. Effusion is likely causing the slight muffled sounds due to weather pressure systems recently in the area. If not improved with saline nasal spray this could be secondary to chemotherapy. For now treating with cortisporin drops and she will wear her nightguard nightly. Discussed that if symptoms are not improved by Monday to alert our office.    Patient expressed understanding and was in agreement with this plan. She also understands that She can call clinic at any time  with any questions, concerns, or complaints.   Thank you for allowing me to participate in the care of this very pleasant patient.   Time Total: 15  Visit consisted of counseling and education dealing with the complex and emotionally intense issues of symptom management in the setting of serious illness.Greater than 50%  of this time was spent counseling and coordinating care related to the above assessment and plan.  Signed by: Nelwyn Salisbury, PA-C

## 2023-01-02 ENCOUNTER — Ambulatory Visit: Payer: Commercial Managed Care - PPO | Attending: General Surgery

## 2023-01-02 VITALS — Wt 167.5 lb

## 2023-01-02 DIAGNOSIS — Z483 Aftercare following surgery for neoplasm: Secondary | ICD-10-CM | POA: Insufficient documentation

## 2023-01-02 NOTE — Therapy (Signed)
OUTPATIENT PHYSICAL THERAPY SOZO SCREENING NOTE   Patient Name: Nichole Brown MRN: [id number] DOB:25-Mar-1966, 57 y.o., female Today's Date: 01/02/2023  PCP: Coral Spikes, DO REFERRING PROVIDER: Rolm Bookbinder, MD   PT End of Session - 01/02/23 [phone]     Visit Number 6   # unchanged due to screen only   PT Start Time 0915    PT Stop Time 0919    PT Time Calculation (min) 4 min    Activity Tolerance Patient tolerated treatment well    Behavior During Therapy [facility] for tasks assessed/performed             Past Medical History:  Diagnosis Date   Anxiety    Bronchitis    Cancer ([place]) 09/2022   left breast IMC   Complication of anesthesia    PONV   Contusion of leg 10/06/2015   Depression    GERD (gastroesophageal reflux disease)    Insomnia    Migraine headache    Pneumonia    2000   PONV (postoperative nausea and vomiting)    Past Surgical History:  Procedure Laterality Date   ABDOMINAL HYSTERECTOMY     BALLOON DILATION N/A 12/14/2020   Procedure: BALLOON DILATION;  Surgeon: Eloise Harman, DO;  [facility];  Service: Endoscopy;  Laterality: N/A;   BIOPSY  12/14/2020   Procedure: BIOPSY;  Surgeon: Eloise Harman, DO;  [facility];  Service: Endoscopy;;   BREAST BIOPSY Left 08/24/2022   [place] bx, coil marker path pending   BREAST LUMPECTOMY WITH RADIOACTIVE SEED AND SENTINEL LYMPH NODE BIOPSY Left 09/29/2022   Procedure: LEFT BREAST LUMPECTOMY WITH RADIOACTIVE SEED AND AXILLARY SENTINEL LYMPH NODE BIOPSY;  Surgeon: Rolm Bookbinder, MD;  Location: [place];  Service: General;  Laterality: Left;   CESAREAN SECTION     CHOLECYSTECTOMY  1988   COLONOSCOPY N/A 12/10/2018    six 4-8 mm polyps in proximal descending colon, mid transverse and hepatic flexure. Diverticulosis. External and internal hemorrhoids. Torturous colon. Two simple adenomas, two serrated polyps, 2 hyperplastic polyps. Surveillance due Dec 2022.     ESOPHAGOGASTRODUODENOSCOPY (EGD) WITH PROPOFOL N/A 12/14/2020   Benign-appearing esophageal stenosis s/p dilation, gastritis, normal duodenum. Mild chronic gastritis.    open heart surgery  1967   PATENT DUCTUS ARTERIOUS REPAIR  1967   POLYPECTOMY  12/10/2018   Procedure: POLYPECTOMY;  Surgeon: Danie Binder, MD;  [facility];  Service: Endoscopy;;  hepatic flexure, descending, transverse   PORTACATH PLACEMENT N/A 11/14/2022   Procedure: PORT PLACEMENT WITH ULTRASOUND GUIDANCE;  Surgeon: Rolm Bookbinder, MD;  Location: [place];  Service: General;  Laterality: N/A;   SHOULDER ARTHROSCOPY Left 06/16/2017   Procedure: LEFT SHOULDER ARTHROSCOPY, DEBRIDEMENT, AND DECOMPRESSION;  Surgeon: Newt Minion, MD;  Location: [place];  Service: Orthopedics;  Laterality: Left;   SHOULDER ARTHROSCOPY WITH SUBACROMIAL DECOMPRESSION Left 10/03/2019   Procedure: SHOULDER ARTHROSCOPY WITH DEBRIDEMENT, DECOMPRESSION;  Surgeon: Corky Mull, MD;  Location: [facility];  Service: Orthopedics;  Laterality: Left;   SHOULDER SURGERY Left 2008   [facility] joint   Patient Active Problem List   Diagnosis Date Noted   [place] 11/18/2022   Malignant neoplasm of upper-outer quadrant of left breast in female, estrogen receptor positive ([place]) 08/29/2022   Depression, major, single episode, moderate ([facility]) 01/01/2019   Generalized anxiety disorder 01/01/2019   Gastroesophageal reflux disease 12/26/2016   Migraine headache 07/24/2013   Insomnia 05/30/2013    REFERRING  DIAG: left breast cancer at risk for lymphedema  THERAPY DIAG:  Aftercare following surgery for neoplasm  PERTINENT HISTORY: This mass was biopsied and was consistent with invasive mammary carcinoma grade 1 ER positive greater than 90%, PR +31 to 40% and HER2 negative. Hx of 3 shoulder surgeries on the left prior to surgery.   Had a left lumpectomy and SLNB on 09/29/22.  1 negative lymph node removed   PRECAUTIONS: left UE Lymphedema  risk, None  SUBJECTIVE: Pt returns for her first 3 month L-Dex screen.   PAIN:  Are you having pain? No  SOZO SCREENING: Patient was assessed today using the SOZO machine to determine the lymphedema index score. This was compared to her baseline score. It was determined that she is within the recommended range when compared to her baseline and no further action is needed at this time. She will continue SOZO screenings. These are done every 3 months for 2 years post operatively followed by every 6 months for 2 years, and then annually.   L-DEX FLOWSHEETS - 01/02/23 0900       L-DEX LYMPHEDEMA SCREENING   Measurement Type Unilateral    L-DEX MEASUREMENT EXTREMITY Upper Extremity    POSITION  Standing    DOMINANT SIDE Right    At Risk Side Left    BASELINE SCORE (UNILATERAL) -3.9    L-DEX SCORE (UNILATERAL) -8.5    VALUE CHANGE (UNILAT) -4.6               Otelia Limes, PTA 01/02/2023, 9:18 AM

## 2023-01-03 ENCOUNTER — Encounter: Payer: Self-pay | Admitting: Oncology

## 2023-01-03 ENCOUNTER — Other Ambulatory Visit: Payer: Self-pay

## 2023-01-04 ENCOUNTER — Telehealth: Payer: Self-pay | Admitting: Nurse Practitioner

## 2023-01-04 ENCOUNTER — Telehealth: Payer: Self-pay | Admitting: *Deleted

## 2023-01-04 NOTE — Telephone Encounter (Signed)
Spoke with patient per v/o Dr. Janese Banks. Patient needs [facility] in person apt.  Pt has blisters on her genitals. H/o shingles in the past. She needs an in person smc visit. I reached out to pt and she is unable to come today as she is taking her car to the shop. She requested an apt in the am to see APP.  Apt made with Lauren-at 9 am.  Judson Roch, [place] asked me to reach back to the pt via telephone to see if she would like a virtual visit today to discuss starting antiviral meds if this is shingles. I attempted to reach pt. Left vm and sent a mychart msg to pt. Will wait on pt's response before scheduling mychart today.

## 2023-01-04 NOTE — Telephone Encounter (Signed)
Pt called to return message for virtual appt[place]. Please advise with what to move forward with as far as scheduling

## 2023-01-05 ENCOUNTER — Encounter: Payer: Self-pay | Admitting: Nurse Practitioner

## 2023-01-05 ENCOUNTER — Inpatient Hospital Stay (HOSPITAL_BASED_OUTPATIENT_CLINIC_OR_DEPARTMENT_OTHER): Payer: Commercial Managed Care - PPO | Admitting: Nurse Practitioner

## 2023-01-05 ENCOUNTER — Other Ambulatory Visit: Payer: Self-pay

## 2023-01-05 VITALS — BP 100/65 | HR 96 | Temp 97.8°F | Wt 168.7 lb

## 2023-01-05 DIAGNOSIS — Z170 Estrogen receptor positive status [ER+]: Secondary | ICD-10-CM | POA: Diagnosis not present

## 2023-01-05 DIAGNOSIS — N9089 Other specified noninflammatory disorders of vulva and perineum: Secondary | ICD-10-CM

## 2023-01-05 DIAGNOSIS — B009 Herpesviral infection, unspecified: Secondary | ICD-10-CM

## 2023-01-05 DIAGNOSIS — L271 Localized skin eruption due to drugs and medicaments taken internally: Secondary | ICD-10-CM

## 2023-01-05 DIAGNOSIS — A6004 Herpesviral vulvovaginitis: Secondary | ICD-10-CM | POA: Diagnosis not present

## 2023-01-05 DIAGNOSIS — N905 Atrophy of vulva: Secondary | ICD-10-CM | POA: Diagnosis not present

## 2023-01-05 DIAGNOSIS — Z5111 Encounter for antineoplastic chemotherapy: Secondary | ICD-10-CM | POA: Diagnosis not present

## 2023-01-05 DIAGNOSIS — H6091 Unspecified otitis externa, right ear: Secondary | ICD-10-CM | POA: Diagnosis not present

## 2023-01-05 DIAGNOSIS — T451X5A Adverse effect of antineoplastic and immunosuppressive drugs, initial encounter: Secondary | ICD-10-CM | POA: Diagnosis not present

## 2023-01-05 DIAGNOSIS — L270 Generalized skin eruption due to drugs and medicaments taken internally: Secondary | ICD-10-CM | POA: Diagnosis not present

## 2023-01-05 DIAGNOSIS — M26603 Bilateral temporomandibular joint disorder, unspecified: Secondary | ICD-10-CM | POA: Diagnosis not present

## 2023-01-05 DIAGNOSIS — C50412 Malignant neoplasm of upper-outer quadrant of left female breast: Secondary | ICD-10-CM | POA: Diagnosis not present

## 2023-01-05 MED ORDER — VALACYCLOVIR HCL 500 MG PO TABS
ORAL_TABLET | ORAL | 0 refills | Status: DC
  Filled 2023-01-05: qty 35, 25d supply, fill #0

## 2023-01-05 MED ORDER — GABAPENTIN 300 MG PO CAPS
300.0000 mg | ORAL_CAPSULE | Freq: Every evening | ORAL | 1 refills | Status: DC | PRN
  Filled 2023-01-05: qty 30, 30d supply, fill #0

## 2023-01-05 NOTE — Patient Instructions (Signed)
I've sent prescriptions to your pharmacy for valtrex for the viral outbreak and gabapentin for pain and itching.  If you like the samples I provided, they are available from [employer] and [employer]. Coconut oil is also a cost effective alternative.  Please purchase a urea cream such as udder butter. Look for a urea concentration of 10-30%. Apply this cream to your hands and feet 3 times a day during chemotherapy to avoid the redness and dryness.   It was a pleasure meeting you today and thank you for allowing me to participate in your care. -Beckey Rutter, NP

## 2023-01-05 NOTE — Progress Notes (Signed)
Symptom Management [place] [facility] [place]. [facility] [address] [place], [place] [address] [phone] (phone) [phone] (fax)  Patient Care Team: Coral Spikes, DO as PCP - General (Family Medicine) Danie Binder, MD (Inactive) as Consulting Physician (Gastroenterology) Daiva Huge, RN as Oncology Nurse Navigator   Name of the patient: Nichole Brown  [id number]  Dec 08, 1966   Date of visit: 01/05/23  Diagnosis- Breast Cancer  Chief complaint/ Reason for visit- Vulvar irritation & pain  Heme/Onc history:  Oncology History  Malignant neoplasm of upper-outer quadrant of left breast in female, estrogen receptor positive ([place])  08/29/2022 Initial Diagnosis   Malignant neoplasm of upper-outer quadrant of left breast in female, estrogen receptor positive ([place])   08/29/2022 Cancer Staging   Staging form: Breast, AJCC 8th Edition - Clinical stage from 08/29/2022: Stage IA (cT1b, cN0, cM0, G1, ER+, PR+, HER2-) - Signed by Sindy Guadeloupe, MD on 08/29/2022 Stage prefix: Initial diagnosis Histologic grading system: 3 grade system    Genetic Testing   Negative genetic testing. No pathogenic variants identified on the [facility] CancerNext-Expanded+RNA panel. The report date is 09/16/2022.  The CancerNext-Expanded + RNAinsight gene panel offered by [employer] and includes sequencing and rearrangement analysis for the following 77 genes: IP, ALK, APC*, ATM*, AXIN2, BAP1, BARD1, BLM, BMPR1A, BRCA1*, BRCA2*, BRIP1*, CDC73, CDH1*,CDK4, CDKN1B, CDKN2A, CHEK2*, CTNNA1, DICER1, FANCC, FH, FLCN, GALNT12, KIF1B, LZTR1, MAX, MEN1, MET, MLH1*, MSH2*, MSH3, MSH6*, MUTYH*, NBN, NF1*, NF2, NTHL1, PALB2*, PHOX2B, PMS2*, POT1, PRKAR1A, PTCH1, PTEN*, RAD51C*, RAD51D*,RB1, RECQL, RET, SDHA, SDHAF2, SDHB, SDHC, SDHD, SMAD4, SMARCA4, SMARCB1, SMARCE1, STK11, SUFU, TMEM127, TP53*,TSC1, TSC2, VHL and XRCC2 (sequencing and  deletion/duplication); EGFR, EGLN1, HOXB13, KIT, MITF, PDGFRA, POLD1 and POLE (sequencing only); EPCAM and GREM1 (deletion/duplication only).   10/07/2022 Cancer Staging   Staging form: Breast, AJCC 8th Edition - Pathologic stage from 10/07/2022: Stage IA (pT1b, pN0, cM0, G2, ER+, PR+, HER2-) - Signed by Sindy Guadeloupe, MD on 10/07/2022 Multigene prognostic tests performed: Oncotype DX Histologic grading system: 3 grade system   11/11/2022 -  Chemotherapy   Patient is on Treatment Plan : BREAST TC q21d       Interval history-patient is 57 year old female currently undergoing chemotherapy for above history of breast cancer who presents to symptom management clinic for complaints of vulvar pain and irritation.  Symptoms started after her last chemotherapy.  She says that she noticed constellation of painful bumps.  Described a burning sensation and pins-and-needles.  Also itchy.  No vaginal discharge.  She has not taken anything for her symptoms.  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  Additionally, she has a history of psoriasis.  Has also noticed dryness and redness of her hands and feet. No changes in detergents, pads, or new topicals.   ECOG FS:1 - Symptomatic but completely ambulatory  Review of systems- Review of Systems  Constitutional:  Positive for malaise/fatigue. Negative for chills, fever and weight loss.  HENT:  Negative for hearing loss, nosebleeds, sore throat and tinnitus.   Eyes:  Negative for blurred vision and double vision.  Respiratory:  Negative for cough, hemoptysis, shortness of breath and wheezing.   Cardiovascular:  Negative for chest pain, palpitations and leg swelling.  Gastrointestinal:  Negative for abdominal pain, blood in stool, constipation, diarrhea, melena, nausea and vomiting.  Genitourinary:  Negative for  dysuria and urgency.  Musculoskeletal:  Negative for back pain, falls, joint pain and myalgias.   Skin:  Positive for itching and rash.  Neurological:  Positive for sensory change. Negative for dizziness, tingling, loss of consciousness, weakness and headaches.  Endo/Heme/Allergies:  Negative for environmental allergies. Does not bruise/bleed easily.  Psychiatric/Behavioral:  Negative for depression. The patient is not nervous/anxious and does not have insomnia.       Allergies  Allergen Reactions   Cymbalta [Duloxetine Hcl] Other (See Comments)    Hallucinations; sleep walking   Nortriptyline Nausea And Vomiting   Phenergan [Promethazine Hcl]     Restless legs and cramping    Past Medical History:  Diagnosis Date   Anxiety    Bronchitis    Cancer ([place]) 09/2022   left breast IMC   Complication of anesthesia    PONV   Contusion of leg 10/06/2015   Depression    GERD (gastroesophageal reflux disease)    Insomnia    Migraine headache    Pneumonia    2000   PONV (postoperative nausea and vomiting)     Past Surgical History:  Procedure Laterality Date   ABDOMINAL HYSTERECTOMY     BALLOON DILATION N/A 12/14/2020   Procedure: BALLOON DILATION;  Surgeon: Eloise Harman, DO;  [facility];  Service: Endoscopy;  Laterality: N/A;   BIOPSY  12/14/2020   Procedure: BIOPSY;  Surgeon: Eloise Harman, DO;  [facility];  Service: Endoscopy;;   BREAST BIOPSY Left 08/24/2022   [place] bx, coil marker path pending   BREAST LUMPECTOMY WITH RADIOACTIVE SEED AND SENTINEL LYMPH NODE BIOPSY Left 09/29/2022   Procedure: LEFT BREAST LUMPECTOMY WITH RADIOACTIVE SEED AND AXILLARY SENTINEL LYMPH NODE BIOPSY;  Surgeon: Rolm Bookbinder, MD;  Location: [place];  Service: General;  Laterality: Left;   CESAREAN SECTION     CHOLECYSTECTOMY  1988   COLONOSCOPY N/A 12/10/2018    six 4-8 mm polyps in proximal descending colon, mid transverse and hepatic flexure. Diverticulosis. External and internal hemorrhoids. Torturous colon. Two simple adenomas, two  serrated polyps, 2 hyperplastic polyps. Surveillance due Dec 2022.    ESOPHAGOGASTRODUODENOSCOPY (EGD) WITH PROPOFOL N/A 12/14/2020   Benign-appearing esophageal stenosis s/p dilation, gastritis, normal duodenum. Mild chronic gastritis.    open heart surgery  1967   PATENT DUCTUS ARTERIOUS REPAIR  1967   POLYPECTOMY  12/10/2018   Procedure: POLYPECTOMY;  Surgeon: Danie Binder, MD;  [facility];  Service: Endoscopy;;  hepatic flexure, descending, transverse   PORTACATH PLACEMENT N/A 11/14/2022   Procedure: PORT PLACEMENT WITH ULTRASOUND GUIDANCE;  Surgeon: Rolm Bookbinder, MD;  Location: [place];  Service: General;  Laterality: N/A;   SHOULDER ARTHROSCOPY Left 06/16/2017   Procedure: LEFT SHOULDER ARTHROSCOPY, DEBRIDEMENT, AND DECOMPRESSION;  Surgeon: Newt Minion, MD;  Location: [place];  Service: Orthopedics;  Laterality: Left;   SHOULDER ARTHROSCOPY WITH SUBACROMIAL DECOMPRESSION Left 10/03/2019   Procedure: SHOULDER ARTHROSCOPY WITH DEBRIDEMENT, DECOMPRESSION;  Surgeon: Corky Mull, MD;  Location: [facility];  Service: Orthopedics;  Laterality: Left;   SHOULDER SURGERY Left 2008   [facility] joint    Social History   Socioeconomic History   Marital status: Married    Spouse name: daniel   Number of children: 4   Years of education: Not on file   Highest education level: Some college, no degree  Occupational History   Not on file  Tobacco Use   Smoking status: Never  Smokeless tobacco: Never  Vaping Use   Vaping Use: Never used  Substance and Sexual Activity   Alcohol use: Not Currently    Comment: occassionally-not since chemo started   Drug use: No   Sexual activity: Yes    Birth control/protection: Surgical    Comment: hyst  Other Topics Concern   Not on file  Social History Narrative   Not on file   Social Determinants of Health   Financial Resource Strain: Low Risk  (07/12/2022)   Overall Financial Resource Strain (CARDIA)    Difficulty  of Paying Living Expenses: Not hard at all  Food Insecurity: No Food Insecurity (07/12/2022)   Hunger Vital Sign    Worried About Running Out of Food in the Last Year: Never true    Ran Out of Food in the Last Year: Never true  Transportation Needs: No Transportation Needs (07/12/2022)   PRAPARE - Hydrologist (Medical): No    Lack of Transportation (Non-Medical): No  Physical Activity: Insufficiently Active (07/12/2022)   Exercise Vital Sign    Days of Exercise per Week: 3 days    Minutes of Exercise per Session: 30 min  Stress: Stress Concern Present (07/12/2022)   [employer]    Feeling of Stress : To some extent  Social Connections: Moderately Integrated (07/12/2022)   Social Connection and Isolation Panel [NHANES]    Frequency of Communication with Friends and Family: Once a week    Frequency of Social Gatherings with Friends and Family: Once a week    Attends Religious Services: More than 4 times per year    Active Member of [employer] or Organizations: Yes    Attends Music therapist: More than 4 times per year    Marital Status: Married  Human resources officer Violence: Not At Risk (07/12/2022)   Humiliation, Afraid, Rape, and Kick questionnaire    Fear of Current or Ex-Partner: No    Emotionally Abused: No    Physically Abused: No    Sexually Abused: No    Family History  Problem Relation Age of Onset   Breast cancer Mother 23   Hypertension Father    Colon cancer Paternal Uncle        dx 63s   Hypertension Maternal Grandmother    Diabetes Maternal Grandmother    Heart disease Maternal Grandmother 56       MI   Hypertension Maternal Grandfather    Throat cancer Maternal Grandfather 83     Current Outpatient Medications:    acetaminophen (TYLENOL) 500 MG tablet, Take 500 mg by mouth every 6 (six) hours as needed for moderate pain., Disp: , Rfl:    betamethasone dipropionate 0.05  % cream, Apply topically 2 (two) times daily., Disp: 30 g, Rfl: 0   CALCIUM PO, Take 1 capsule by mouth daily., Disp: , Rfl:    famotidine (PEPCID) 20 MG tablet, Take 20 mg by mouth 2 (two) times daily., Disp: , Rfl:    fluticasone (FLONASE) 50 MCG/ACT nasal spray, Place 1 spray into both nostrils daily as needed for allergies or rhinitis., Disp: , Rfl:    lidocaine-prilocaine (EMLA) cream, Apply to affected area once, Disp: 30 g, Rfl: 3   Lifitegrast (XIIDRA) 5 % SOLN, Instill 1 drop into both eyes twice a day, Disp: 180 each, Rfl: 4   neomycin-polymyxin-hydrocortisone (CORTISPORIN) 3.5-10000-1 OTIC suspension, Place 3 drops into the right ear 4 (four) times daily., Disp: 10  mL, Rfl: 0   nystatin (MYCOSTATIN) 100000 UNIT/ML suspension, Take 5 mLs (500,000 Units total) by mouth 4 (four) times daily. Swish and spit, Disp: 120 mL, Rfl: 0   ondansetron (ZOFRAN-ODT) 4 MG disintegrating tablet, Take 1 tablet (4 mg total) by mouth every 8 (eight) hours as needed for Nausea migraine, Disp: 20 tablet, Rfl: 11   pantoprazole (PROTONIX) 20 MG tablet, Take 1 tablet (20 mg total) by mouth daily., Disp: 30 tablet, Rfl: 3   predniSONE (DELTASONE) 5 MG tablet, Take 4 tablets (20 mg total) by mouth daily with breakfast., Disp: 40 tablet, Rfl: 2   Probiotic Product (PROBIOTIC PO), Take 1 capsule by mouth daily., Disp: , Rfl:    rizatriptan (MAXALT-MLT) 10 MG disintegrating tablet, Take 1 tablet (10 mg total) by mouth once as needed (may repeat dose once in > 2 hrs) for up to 1 dose, Disp: 10 tablet, Rfl: 11   topiramate (TOPAMAX) 25 MG tablet, Take 1 tablet (25 mg total) by mouth 2 (two) times daily, Disp: 60 tablet, Rfl: 11   traMADol (ULTRAM) 50 MG tablet, Take 1 tablet (50 mg total) by mouth every 6 (six) hours as needed., Disp: 10 tablet, Rfl: 0   traZODone (DESYREL) 50 MG tablet, Take 1 tablet (50 mg total) by mouth at bedtime as needed for sleep., Disp: 90 tablet, Rfl: 1  Physical exam:  Vitals:   01/05/23  0902  BP: 100/65  Pulse: 96  Temp: 97.8 F (36.6 C)  TempSrc: Tympanic  Weight: 168 lb 11.2 oz (76.5 kg)   Physical Exam Exam conducted with a chaperone present.  Constitutional:      Appearance: She is not ill-appearing.  Pulmonary:     Effort: No respiratory distress.  Genitourinary:    Pubic Area: No rash.      Labia:        Right: No rash, tenderness, lesion or injury.        Left: No rash, tenderness, lesion or injury.      Urethra: No prolapse, urethral swelling or urethral lesion.     Comments: Patient reports itching of perineum and tenderness. On exam, no redness. Skin of vulva and surrounding tissue does appear dry with some areas of peeling. No lesions are visualized.  Skin:    Findings: Erythema and rash present.     Comments: Dryness of hands and feet, mild redness. Peeling nails of toes.    Neurological:     Mental Status: She is alert and oriented to person, place, and time.  Psychiatric:        Mood and Affect: Mood normal.        Behavior: Behavior normal.         Latest Ref Rng & Units 12/23/2022    8:33 AM  CMP  Glucose 70 - 99 mg/dL 112   BUN 6 - 20 mg/dL 12   Creatinine 0.44 - 1.00 mg/dL 0.71   Sodium 135 - 145 mmol/L 139   Potassium 3.5 - 5.1 mmol/L 3.7   Chloride 98 - 111 mmol/L 106   CO2 22 - 32 mmol/L 25   Calcium 8.9 - 10.3 mg/dL 8.5   Total Protein 6.5 - 8.1 g/dL 5.9   Total Bilirubin 0.3 - 1.2 mg/dL 0.3   Alkaline Phos 38 - 126 U/L 72   AST 15 - 41 U/L 25   ALT 0 - 44 U/L 32       Latest Ref Rng & Units 12/23/2022  8:33 AM  CBC  WBC 4.0 - 10.5 K/uL 9.5   Hemoglobin 12.0 - 15.0 g/dL 12.3   Hematocrit 36.0 - 46.0 % 37.1   Platelets 150 - 400 K/uL 239    No results found.  Assessment and plan- Patient is a 57 y.o. female diagnosed with breast cancer who presents to Symptom Management Clinic for complaints of vulvar irritation.   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 We reviewed her images which appear  consistent with hsv lesions. Her symptomology supports outbreak as well as well as immunocompromised state during chemotherapy. No lesions visible on exam today however, persistent pruritus and pain. Will treat with valacyclovir 1000 mg daily x 5 days for acute outbreak then decrease to 500 mg daily for suppression. She will continue antiviral during her cancer treatments to hopefully minimize outbreaks. If recurrent outbreaks, plan to increase to 1000 mg dosing at first sign of symptoms. Will also prescribe gabapentin 300 mg at night for nocturnal pruritus and pain.  Vulvar dryness & atrophy- we discussed that vulvar dryness manifests as burning, itching, and dyspareunia but would not cause the lesions she noticed. Related to hypoestrogen state. Provided her with samples of En[employer]anted Rose Natural Vaginal Moisturizer and Vulvar Balm which can be purchased from [employer]. This product is nonhormonal and can ease vulvar dryness. Can also trial coconut oil which is more cost effective. Reviewed differences in lubricants vs hydrating products. Also provided her a sample of hyalogyn which is available from [employer]. I recommended a fingertip of gel applied to vulva 3 nights a week for hydration. Again, this is a nonhormonal option and clinical data has shown similar efficacy to estrogen creams. She may require use of these products lifelong given the need for estrogen suppression and post menopausal state.  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 Advised her to use urea cream, prefer 10-30% concentrations which function as humectants and emollients at those concentrations, three times a day and available over the counter. She can continue topical corticosteroids though continued use may cause skin thinning. Can also trial topical diclofenac if refractory symptoms.   Disposition: Rtc as needed- la   Visit Diagnosis 1. Vulvar irritation   2. HSV-2  infection   3. Palmar plantar erythrodysesthesia    Patient expressed understanding and was in agreement with this plan. She also understands that She can call clinic at any time with any questions, concerns, or complaints.   Thank you for allowing me to participate in the care of this very pleasant patient.   Beckey Rutter, DNP, AGNP-C, [place] [facility] [place]  CC: Dr. Janese Banks

## 2023-01-09 ENCOUNTER — Other Ambulatory Visit: Payer: Self-pay

## 2023-01-12 MED FILL — Dexamethasone Sodium Phosphate Inj 100 MG/10ML: INTRAMUSCULAR | Qty: 1 | Status: AC

## 2023-01-13 ENCOUNTER — Inpatient Hospital Stay: Payer: Commercial Managed Care - PPO | Attending: Oncology

## 2023-01-13 ENCOUNTER — Inpatient Hospital Stay (HOSPITAL_BASED_OUTPATIENT_CLINIC_OR_DEPARTMENT_OTHER): Payer: Commercial Managed Care - PPO | Admitting: Oncology

## 2023-01-13 ENCOUNTER — Other Ambulatory Visit: Payer: Self-pay

## 2023-01-13 ENCOUNTER — Encounter: Payer: Self-pay | Admitting: Oncology

## 2023-01-13 ENCOUNTER — Inpatient Hospital Stay: Payer: Commercial Managed Care - PPO

## 2023-01-13 VITALS — BP 105/70 | HR 93 | Temp 98.5°F | Resp 16 | Ht 62.0 in | Wt 172.0 lb

## 2023-01-13 DIAGNOSIS — R21 Rash and other nonspecific skin eruption: Secondary | ICD-10-CM | POA: Insufficient documentation

## 2023-01-13 DIAGNOSIS — Z170 Estrogen receptor positive status [ER+]: Secondary | ICD-10-CM | POA: Diagnosis not present

## 2023-01-13 DIAGNOSIS — M8580 Other specified disorders of bone density and structure, unspecified site: Secondary | ICD-10-CM | POA: Diagnosis not present

## 2023-01-13 DIAGNOSIS — Z5111 Encounter for antineoplastic chemotherapy: Secondary | ICD-10-CM | POA: Insufficient documentation

## 2023-01-13 DIAGNOSIS — G4700 Insomnia, unspecified: Secondary | ICD-10-CM | POA: Insufficient documentation

## 2023-01-13 DIAGNOSIS — Z800 Family history of malignant neoplasm of digestive organs: Secondary | ICD-10-CM | POA: Diagnosis not present

## 2023-01-13 DIAGNOSIS — C50412 Malignant neoplasm of upper-outer quadrant of left female breast: Secondary | ICD-10-CM | POA: Diagnosis not present

## 2023-01-13 DIAGNOSIS — Z803 Family history of malignant neoplasm of breast: Secondary | ICD-10-CM | POA: Insufficient documentation

## 2023-01-13 DIAGNOSIS — K219 Gastro-esophageal reflux disease without esophagitis: Secondary | ICD-10-CM | POA: Diagnosis not present

## 2023-01-13 DIAGNOSIS — Z79899 Other long term (current) drug therapy: Secondary | ICD-10-CM | POA: Insufficient documentation

## 2023-01-13 DIAGNOSIS — Z801 Family history of malignant neoplasm of trachea, bronchus and lung: Secondary | ICD-10-CM | POA: Insufficient documentation

## 2023-01-13 DIAGNOSIS — Z79624 Long term (current) use of inhibitors of nucleotide synthesis: Secondary | ICD-10-CM | POA: Insufficient documentation

## 2023-01-13 DIAGNOSIS — Z7952 Long term (current) use of systemic steroids: Secondary | ICD-10-CM | POA: Insufficient documentation

## 2023-01-13 LAB — CBC WITH DIFFERENTIAL/PLATELET
Abs Immature Granulocytes: 0.04 10*3/uL (ref 0.00–0.07)
Basophils Absolute: 0.1 10*3/uL (ref 0.0–0.1)
Basophils Relative: 2 %
Eosinophils Absolute: 0.1 10*3/uL (ref 0.0–0.5)
Eosinophils Relative: 1 %
HCT: 34.3 % — ABNORMAL LOW (ref 36.0–46.0)
Hemoglobin: 11.7 g/dL — ABNORMAL LOW (ref 12.0–15.0)
Immature Granulocytes: 1 %
Lymphocytes Relative: 33 %
Lymphs Abs: 2.3 10*3/uL (ref 0.7–4.0)
MCH: 30.5 pg (ref 26.0–34.0)
MCHC: 34.1 g/dL (ref 30.0–36.0)
MCV: 89.3 fL (ref 80.0–100.0)
Monocytes Absolute: 1.2 10*3/uL — ABNORMAL HIGH (ref 0.1–1.0)
Monocytes Relative: 17 %
Neutro Abs: 3.2 10*3/uL (ref 1.7–7.7)
Neutrophils Relative %: 46 %
Platelets: 354 10*3/uL (ref 150–400)
RBC: 3.84 MIL/uL — ABNORMAL LOW (ref 3.87–5.11)
RDW: 15.9 % — ABNORMAL HIGH (ref 11.5–15.5)
WBC: 6.9 10*3/uL (ref 4.0–10.5)
nRBC: 0 % (ref 0.0–0.2)

## 2023-01-13 LAB — COMPREHENSIVE METABOLIC PANEL
ALT: 30 U/L (ref 0–44)
AST: 30 U/L (ref 15–41)
Albumin: 3.4 g/dL — ABNORMAL LOW (ref 3.5–5.0)
Alkaline Phosphatase: 73 U/L (ref 38–126)
Anion gap: 8 (ref 5–15)
BUN: 12 mg/dL (ref 6–20)
CO2: 24 mmol/L (ref 22–32)
Calcium: 8.8 mg/dL — ABNORMAL LOW (ref 8.9–10.3)
Chloride: 107 mmol/L (ref 98–111)
Creatinine, Ser: 0.66 mg/dL (ref 0.44–1.00)
GFR, Estimated: >60 mL/min (ref >60)
Glucose, Bld: 126 mg/dL — ABNORMAL HIGH (ref 70–99)
Potassium: 3.5 mmol/L (ref 3.5–5.1)
Sodium: 139 mmol/L (ref 135–145)
Total Bilirubin: 0.2 mg/dL — ABNORMAL LOW (ref 0.3–1.2)
Total Protein: 6.1 g/dL — ABNORMAL LOW (ref 6.5–8.1)

## 2023-01-13 MED ORDER — SODIUM CHLORIDE 0.9 % IV SOLN
10.0000 mg | Freq: Once | INTRAVENOUS | Status: AC
  Administered 2023-01-13: 10 mg via INTRAVENOUS
  Filled 2023-01-13: qty 10

## 2023-01-13 MED ORDER — SODIUM CHLORIDE 0.9% FLUSH
10.0000 mL | INTRAVENOUS | Status: DC | PRN
  Administered 2023-01-13: 10 mL
  Filled 2023-01-13: qty 10

## 2023-01-13 MED ORDER — HEPARIN SOD (PORK) LOCK FLUSH 100 UNIT/ML IV SOLN
500.0000 [IU] | Freq: Once | INTRAVENOUS | Status: AC | PRN
  Administered 2023-01-13: 500 [IU]
  Filled 2023-01-13: qty 5

## 2023-01-13 MED ORDER — ANASTROZOLE 1 MG PO TABS
1.0000 mg | ORAL_TABLET | Freq: Every day | ORAL | 3 refills | Status: DC
  Filled 2023-01-13: qty 30, 30d supply, fill #0
  Filled 2023-03-24: qty 30, 30d supply, fill #1

## 2023-01-13 MED ORDER — SODIUM CHLORIDE 0.9 % IV SOLN
Freq: Once | INTRAVENOUS | Status: AC
  Filled 2023-01-13: qty 250

## 2023-01-13 MED ORDER — PALONOSETRON HCL INJECTION 0.25 MG/5ML
0.2500 mg | Freq: Once | INTRAVENOUS | Status: AC
  Administered 2023-01-13: 0.25 mg via INTRAVENOUS
  Filled 2023-01-13: qty 5

## 2023-01-13 MED ORDER — SODIUM CHLORIDE 0.9 % IV SOLN
600.0000 mg/m2 | Freq: Once | INTRAVENOUS | Status: AC
  Administered 2023-01-13: 1000 mg via INTRAVENOUS
  Filled 2023-01-13: qty 50

## 2023-01-13 MED ORDER — SODIUM CHLORIDE 0.9 % IV SOLN
75.0000 mg/m2 | Freq: Once | INTRAVENOUS | Status: AC
  Administered 2023-01-13: 140 mg via INTRAVENOUS
  Filled 2023-01-13: qty 14

## 2023-01-13 NOTE — Progress Notes (Signed)
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  This was followed by a diagnostic mammogram and ultrasound which showed multiple benign cysts noted at the 1 o'clock position.  At the 2 o'clock position 3 cm from the nipple was a oval hypoechoic mass measuring 5 x 4 x 3 mm.  No suspicious left axillary adenopathy.  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   Final pathology showed grade two 7 mm tumor with negative margins.  1 sentinel lymph node negative for malignancy.  Oncotype results showed a recurrence score of 28 and absolute benefit of chemotherapy at greater than 15%.  Interval history-vaginal lesions have resolved.  She feels well overall today.  ECOG PS- 0 Pain scale- 0 Opioid associated constipation- no  Review of systems- Review of Systems  Constitutional:  Positive for malaise/fatigue. Negative for chills, fever and weight loss.  HENT:  Negative for congestion, ear discharge and nosebleeds.   Eyes:  Negative for blurred vision.  Respiratory:  Negative for cough, hemoptysis, sputum production, shortness of breath and wheezing.    Cardiovascular:  Negative for chest pain, palpitations, orthopnea and claudication.  Gastrointestinal:  Negative for abdominal pain, blood in stool, constipation, diarrhea, heartburn, melena, nausea and vomiting.  Genitourinary:  Negative for dysuria, flank pain, frequency, hematuria and urgency.  Musculoskeletal:  Negative for back pain, joint pain and myalgias.  Skin:  Negative for rash.  Neurological:  Negative for dizziness, tingling, focal weakness, seizures, weakness and headaches.  Endo/Heme/Allergies:  Does not bruise/bleed easily.  Psychiatric/Behavioral:  Negative for depression and suicidal ideas. The patient does not have insomnia.       Allergies  Allergen Reactions   Cymbalta [Duloxetine Hcl] Other (See Comments)    Hallucinations; sleep walking   Nortriptyline Nausea And Vomiting   Phenergan [Promethazine Hcl]     Restless legs and cramping     Past Medical History:  Diagnosis Date   Anxiety    Bronchitis    Cancer ([place]) 09/2022   left breast IMC   Complication of anesthesia    PONV   Contusion of leg 10/06/2015   Depression    GERD (gastroesophageal reflux disease)    Insomnia    Migraine headache    Pneumonia    2000   PONV (postoperative nausea and vomiting)      Past Surgical History:  Procedure Laterality Date   ABDOMINAL HYSTERECTOMY     BALLOON DILATION N/A 12/14/2020   Procedure: BALLOON DILATION;  Surgeon: Eloise Harman, DO;  [facility];  Service: Endoscopy;  Laterality: N/A;   BIOPSY  12/14/2020   Procedure: BIOPSY;  Surgeon: Eloise Harman, DO;  [facility];  Service: Endoscopy;;   BREAST BIOPSY Left 08/24/2022   [place] bx, coil marker path pending   BREAST LUMPECTOMY WITH RADIOACTIVE SEED AND SENTINEL LYMPH NODE BIOPSY Left 09/29/2022   Procedure: LEFT BREAST LUMPECTOMY WITH RADIOACTIVE SEED AND AXILLARY SENTINEL LYMPH NODE BIOPSY;  Surgeon: Rolm Bookbinder, MD;  Location: [facility];   Service: General;  Laterality: Left;   CESAREAN SECTION     CHOLECYSTECTOMY  1988   COLONOSCOPY N/A 12/10/2018    six 4-8 mm polyps in proximal descending colon, mid transverse and hepatic flexure. Diverticulosis. External and internal hemorrhoids. Torturous colon. Two simple adenomas, two serrated polyps, 2 hyperplastic polyps. Surveillance due Dec 2022.    ESOPHAGOGASTRODUODENOSCOPY (EGD) WITH PROPOFOL N/A 12/14/2020   Benign-appearing esophageal stenosis s/p dilation, gastritis, normal duodenum. Mild chronic gastritis.    open heart surgery  1967   PATENT DUCTUS ARTERIOUS REPAIR  1967   POLYPECTOMY  12/10/2018   Procedure: POLYPECTOMY;  Surgeon: Danie Binder, MD;  [facility];  Service: Endoscopy;;  hepatic flexure, descending, transverse   PORTACATH PLACEMENT N/A 11/14/2022   Procedure: PORT PLACEMENT WITH ULTRASOUND GUIDANCE;  Surgeon: Rolm Bookbinder, MD;  Location: [facility];  Service: General;  Laterality: N/A;   SHOULDER ARTHROSCOPY Left 06/16/2017   Procedure: LEFT SHOULDER ARTHROSCOPY, DEBRIDEMENT, AND DECOMPRESSION;  Surgeon: Newt Minion, MD;  Location: [facility];  Service: Orthopedics;  Laterality: Left;   SHOULDER ARTHROSCOPY WITH SUBACROMIAL DECOMPRESSION Left 10/03/2019   Procedure: SHOULDER ARTHROSCOPY WITH DEBRIDEMENT, DECOMPRESSION;  Surgeon: Corky Mull, MD;  Location: [facility];  Service: Orthopedics;  Laterality: Left;   SHOULDER SURGERY Left 2008   [facility] joint    Social History   Socioeconomic History   Marital status: Married    Spouse name: daniel   Number of children: 4   Years of education: Not on file   Highest education level: Some college, no degree  Occupational History   Not on file  Tobacco Use   Smoking status: Never   Smokeless tobacco: Never  Vaping Use   Vaping Use: Never used  Substance and Sexual Activity   Alcohol use: Not Currently    Comment: occassionally-not since chemo started   Drug use: No   Sexual  activity: Yes    Birth control/protection: Surgical    Comment: hyst  Other Topics Concern   Not on file  Social History Narrative   Not on file   Social Determinants of Health   Financial Resource Strain: Low Risk  (07/12/2022)   Overall Financial Resource Strain (CARDIA)    Difficulty of Paying Living Expenses: Not hard at all  Food Insecurity: No Food Insecurity (07/12/2022)   Hunger Vital Sign    Worried About Running Out of Food in the Last Year: Never true    [place] in the Last Year: Never true  Transportation Needs: No Transportation Needs (07/12/2022)   PRAPARE - Hydrologist (Medical): No    Lack of Transportation (Non-Medical): No  Physical Activity: Insufficiently Active (07/12/2022)   Exercise Vital Sign    Days of Exercise per Week: 3 days    Minutes of Exercise per Session: 30 min  Stress: Stress Concern Present (07/12/2022)   [place]    Feeling of Stress : To some extent  Social Connections: Moderately Integrated (07/12/2022)   Social Connection and Isolation Panel [NHANES]    Frequency of Communication with Friends  and Family: Once a week    Frequency of Social Gatherings with Friends and Family: Once a week    Attends Religious Services: More than 4 times per year    Active Member of [employer] or Organizations: Yes    Attends Music therapist: More than 4 times per year    Marital Status: Married  Human resources officer Violence: Not At Risk (07/12/2022)   Humiliation, Afraid, Rape, and Kick questionnaire    Fear of Current or Ex-Partner: No    Emotionally Abused: No    Physically Abused: No    Sexually Abused: No    Family History  Problem Relation Age of Onset   Breast cancer Mother 47   Hypertension Father    Colon cancer Paternal Uncle        dx 38s   Hypertension Maternal Grandmother    Diabetes Maternal Grandmother    Heart disease Maternal  Grandmother 33       MI   Hypertension Maternal Grandfather    Throat cancer Maternal Grandfather 83     Current Outpatient Medications:    acetaminophen (TYLENOL) 500 MG tablet, Take 500 mg by mouth every 6 (six) hours as needed for moderate pain., Disp: , Rfl:    betamethasone dipropionate 0.05 % cream, Apply topically 2 (two) times daily., Disp: 30 g, Rfl: 0   CALCIUM PO, Take 1 capsule by mouth daily., Disp: , Rfl:    famotidine (PEPCID) 20 MG tablet, Take 20 mg by mouth 2 (two) times daily., Disp: , Rfl:    fluticasone (FLONASE) 50 MCG/ACT nasal spray, Place 1 spray into both nostrils daily as needed for allergies or rhinitis., Disp: , Rfl:    gabapentin (NEURONTIN) 300 MG capsule, Take 1 capsule (300 mg total) by mouth at bedtime as needed (for pain & itching associated with hsv outbreak). May cause drowsiness, Disp: 30 capsule, Rfl: 1   lidocaine-prilocaine (EMLA) cream, Apply to affected area once, Disp: 30 g, Rfl: 3   Lifitegrast (XIIDRA) 5 % SOLN, Instill 1 drop into both eyes twice a day, Disp: 180 each, Rfl: 4   neomycin-polymyxin-hydrocortisone (CORTISPORIN) 3.5-10000-1 OTIC suspension, Place 3 drops into the right ear 4 (four) times daily., Disp: 10 mL, Rfl: 0   nystatin (MYCOSTATIN) 100000 UNIT/ML suspension, Take 5 mLs (500,000 Units total) by mouth 4 (four) times daily. Swish and spit, Disp: 120 mL, Rfl: 0   ondansetron (ZOFRAN-ODT) 4 MG disintegrating tablet, Take 1 tablet (4 mg total) by mouth every 8 (eight) hours as needed for Nausea migraine, Disp: 20 tablet, Rfl: 11   pantoprazole (PROTONIX) 20 MG tablet, Take 1 tablet (20 mg total) by mouth daily., Disp: 30 tablet, Rfl: 3   predniSONE (DELTASONE) 5 MG tablet, Take 4 tablets (20 mg total) by mouth daily with breakfast., Disp: 40 tablet, Rfl: 2   Probiotic Product (PROBIOTIC PO), Take 1 capsule by mouth daily., Disp: , Rfl:    rizatriptan (MAXALT-MLT) 10 MG disintegrating tablet, Take 1 tablet (10 mg total) by mouth daily  as needed (may repeat dose once in > 2 hrs) for up to 1 dose, Disp: 10 tablet, Rfl: 11   topiramate (TOPAMAX) 25 MG tablet, Take 1 tablet (25 mg total) by mouth 2 (two) times daily, Disp: 60 tablet, Rfl: 11   traMADol (ULTRAM) 50 MG tablet, Take 1 tablet (50 mg total) by mouth every 6 (six) hours as needed., Disp: 10 tablet, Rfl: 0   traZODone (DESYREL) 50 MG tablet, Take 1 tablet (  50 mg total) by mouth at bedtime as needed for sleep., Disp: 90 tablet, Rfl: 1   valACYclovir (VALTREX) 500 MG tablet, Take 2 tablets daily (1000 mg) by mouth daily x 5 days for acute outbreak then take 1 tablet (500 mg) by mouth daily for suppression. Continue during cancer treatments, Disp: 35 tablet, Rfl: 0  Physical exam: There were no vitals filed for this visit. Physical Exam Cardiovascular:     Rate and Rhythm: Normal rate and regular rhythm.     Heart sounds: Normal heart sounds.  Pulmonary:     Effort: Pulmonary effort is normal.     Breath sounds: Normal breath sounds.  Abdominal:     General: Bowel sounds are normal.     Palpations: Abdomen is soft.  Skin:    General: Skin is warm and dry.  Neurological:     Mental Status: She is alert and oriented to person, place, and time.         Latest Ref Rng & Units 12/23/2022    8:33 AM  CMP  Glucose 70 - 99 mg/dL 112   BUN 6 - 20 mg/dL 12   Creatinine 0.44 - 1.00 mg/dL 0.71   Sodium 135 - 145 mmol/L 139   Potassium 3.5 - 5.1 mmol/L 3.7   Chloride 98 - 111 mmol/L 106   CO2 22 - 32 mmol/L 25   Calcium 8.9 - 10.3 mg/dL 8.5   Total Protein 6.5 - 8.1 g/dL 5.9   Total Bilirubin 0.3 - 1.2 mg/dL 0.3   Alkaline Phos 38 - 126 U/L 72   AST 15 - 41 U/L 25   ALT 0 - 44 U/L 32       Latest Ref Rng & Units 01/13/2023    8:28 AM  CBC  WBC 4.0 - 10.5 K/uL 6.9   Hemoglobin 12.0 - 15.0 g/dL 11.7   Hematocrit 36.0 - 46.0 % 34.3   Platelets 150 - 400 K/uL 354      Assessment and plan- Patient is a 57 y.o. female  with history of pathological prognostic  stage Ia invasive mammary carcinoma of the left breast pT1b N0 M0 ER greater than 90% positive, PR 31 to 40% positive and HER2 negative.  She is here for on treatment assessment prior to cycle 4 of adjuvant TC chemotherapy  Counts okay to proceed with cycle 4 of adjuvant TC chemotherapy today with Udenyca on day 3.  This will be her last chemo.  She is being referred to radiation oncology for adjuvant radiation treatment at this time.  I will also plan to get in touch with surgery to get her port taken out in about 1 month's time.  Patient does get skin rash secondary to docetaxel and therefore takes 1 week of steroids after chemotherapy.  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  Given that her tumor was ER PR positive hormone therapy is indicated at this time.  Idiscussed the role for hormone therapy. Given that she is postmenopausal I would favor 5 years of adjuvant hormone therapy with aromatase inhibitor. I discussed the risks and benefits of Arimidex including all but not limited to fatigue, hypercholesterolemia, hot flashes, arthralgias and worsening bone health.  Patient will also need to be on calcium 1200 mg along with vitamin D 800 international units.  Baseline bone density scan showed osteopenia but her 10-year probability of a major osteoporotic fracture was less  than 20% and hip fracture less than 3%. I would like her to finish radiation therapy and start hormone therapy thereafter. Patient verbalized understanding and agrees to proceed.  I would like the patient to take endocrine therapy for at least 5 years but given that she had high risk breast cancer it would be reasonable to continue it for 10 years.  Also given high risk Oncotype score and need for adjuvant chemotherapy I would recommend adjuvant bisphosphonates with Zometa to reduce the risk of skeletal related fractures.  Zometa will be given either every 3 months for 2 years or  every 6 months for 3 years.  Discussed risks and benefits of Zometa including all but not limited to possible risk of osteonecrosis of the jaw.  We will obtain dental clearance prior and tentatively plan to start Zometa in about 2 to 3 months time.  In terms of future breast cancer surveillance patient will need routine breast exams as well as yearly mammograms but there would be no role for tumor marker testing or routine surveillance imaging.  Patient comprehends my plan well.     Visit Diagnosis 1. Encounter for antineoplastic chemotherapy   2. Malignant neoplasm of upper-outer quadrant of left breast in female, estrogen receptor positive ([place])      Dr. Randa Evens, MD, MPH [facility] [phone] 01/13/2023 8:48 AM

## 2023-01-13 NOTE — Addendum Note (Signed)
Addended by: Luella Cook on: 01/13/2023 12:39 PM   Modules accepted: Orders

## 2023-01-13 NOTE — Patient Instructions (Signed)
[facility]  Discharge Instructions: Thank you for choosing [facility] to provide your oncology and hematology care.  If you have a lab appointment with the [facility], please go directly to the [facility] and check in at the registration area.  Wear comfortable clothing and clothing appropriate for easy access to any Portacath or PICC line.   We strive to give you quality time with your provider. You may need to reschedule your appointment if you arrive late (15 or more minutes).  Arriving late affects you and other patients whose appointments are after yours.  Also, if you miss three or more appointments without notifying the office, you may be dismissed from the clinic at the provider's discretion.      For prescription refill requests, have your pharmacy contact our office and allow 72 hours for refills to be completed.    Today you received the following chemotherapy and/or immunotherapy agents: cycloPHOSphamide, DOCEtaxel    To help prevent nausea and vomiting after your treatment, we encourage you to take your nausea medication as directed.  BELOW ARE SYMPTOMS THAT SHOULD BE REPORTED IMMEDIATELY: *FEVER GREATER THAN 100.4 F (38 C) OR HIGHER *CHILLS OR SWEATING *NAUSEA AND VOMITING THAT IS NOT CONTROLLED WITH YOUR NAUSEA MEDICATION *UNUSUAL SHORTNESS OF BREATH *UNUSUAL BRUISING OR BLEEDING *URINARY PROBLEMS (pain or burning when urinating, or frequent urination) *BOWEL PROBLEMS (unusual diarrhea, constipation, pain near the anus) TENDERNESS IN MOUTH AND THROAT WITH OR WITHOUT PRESENCE OF ULCERS (sore throat, sores in mouth, or a toothache) UNUSUAL RASH, SWELLING OR PAIN  UNUSUAL VAGINAL DISCHARGE OR ITCHING   Items with * indicate a potential emergency and should be followed up as soon as possible or go to the Emergency Department if any problems should occur.  Please show the CHEMOTHERAPY ALERT CARD or IMMUNOTHERAPY ALERT  CARD at check-in to the Emergency Department and triage nurse.  Should you have questions after your visit or need to cancel or reschedule your appointment, please contact [facility]  [phone] and follow the prompts.  Office hours are 8:00 a.m. to 4:30 p.m. Monday - Friday. Please note that voicemails left after 4:00 p.m. may not be returned until the following business day.  We are closed weekends and major holidays. You have access to a nurse at all times for urgent questions. Please call the main number to the clinic [phone] and follow the prompts.  For any non-urgent questions, you may also contact your provider using MyChart. We now offer e-Visits for anyone 21 and older to request care online for non-urgent symptoms. For details visit [email or web address].   Also download the MyChart app! Go to the app store, search "MyChart", open the app, select [facility], and log in with your MyChart username and password.

## 2023-01-16 ENCOUNTER — Other Ambulatory Visit: Payer: Self-pay

## 2023-01-16 ENCOUNTER — Ambulatory Visit
Admission: RE | Admit: 2023-01-16 | Discharge: 2023-01-16 | Disposition: A | Discharge status: Home / Self Care | Payer: Commercial Managed Care - PPO | Source: Ambulatory Visit | Attending: Radiation Oncology | Admitting: Radiation Oncology

## 2023-01-16 ENCOUNTER — Encounter: Payer: Self-pay | Admitting: *Deleted

## 2023-01-16 ENCOUNTER — Inpatient Hospital Stay: Payer: Commercial Managed Care - PPO

## 2023-01-16 VITALS — BP 94/61 | HR 97 | Temp 98.4°F | Resp 14 | Ht 62.0 in | Wt 172.8 lb

## 2023-01-16 DIAGNOSIS — Z170 Estrogen receptor positive status [ER+]: Secondary | ICD-10-CM | POA: Insufficient documentation

## 2023-01-16 DIAGNOSIS — M8580 Other specified disorders of bone density and structure, unspecified site: Secondary | ICD-10-CM | POA: Diagnosis not present

## 2023-01-16 DIAGNOSIS — C50412 Malignant neoplasm of upper-outer quadrant of left female breast: Secondary | ICD-10-CM | POA: Insufficient documentation

## 2023-01-16 DIAGNOSIS — R21 Rash and other nonspecific skin eruption: Secondary | ICD-10-CM | POA: Diagnosis not present

## 2023-01-16 DIAGNOSIS — Z7952 Long term (current) use of systemic steroids: Secondary | ICD-10-CM | POA: Diagnosis not present

## 2023-01-16 DIAGNOSIS — G4700 Insomnia, unspecified: Secondary | ICD-10-CM | POA: Diagnosis not present

## 2023-01-16 DIAGNOSIS — Z79899 Other long term (current) drug therapy: Secondary | ICD-10-CM | POA: Diagnosis not present

## 2023-01-16 DIAGNOSIS — K219 Gastro-esophageal reflux disease without esophagitis: Secondary | ICD-10-CM | POA: Diagnosis not present

## 2023-01-16 DIAGNOSIS — Z5111 Encounter for antineoplastic chemotherapy: Secondary | ICD-10-CM | POA: Diagnosis not present

## 2023-01-16 DIAGNOSIS — Z79624 Long term (current) use of inhibitors of nucleotide synthesis: Secondary | ICD-10-CM | POA: Diagnosis not present

## 2023-01-16 MED ORDER — PEGFILGRASTIM-JMDB 6 MG/0.6ML ~~LOC~~ SOSY
6.0000 mg | PREFILLED_SYRINGE | Freq: Once | SUBCUTANEOUS | Status: AC
  Administered 2023-01-16: 6 mg via SUBCUTANEOUS
  Filled 2023-01-16: qty 0.6

## 2023-01-16 NOTE — Progress Notes (Signed)
Met with Nichole Brown at her follow up appt.   She is doing well, no complaints.

## 2023-01-16 NOTE — Progress Notes (Signed)
Radiation Oncology Follow up Note  Name: Nichole Brown   Date:   01/16/2023 MRN:  [id number] DOB: 03-16-1966    This 57 y.o. female presents to the clinic today for reevaluation for stage Ia (pT1b N0 M0) ER/PR positive invasive mammary carcinoma left breast status post wide local excision and adjuvant chemotherapy.  REFERRING PROVIDER: Coral Spikes, DO  HPI: Patient is a 57 year old female originally consulted back in November for stage Ia ER/PR positive invasive mammary carcinoma status post wide local excision and sentinel node biopsy..  Based on her Oncotype she underwent 4 cycles of adjuvant TC chemotherapy which she is tolerated fairly well.  She did develop a skin rash which is resolving.  She still also has some bruising around her lumpectomy site.  She otherwise is without complaint.  COMPLICATIONS OF TREATMENT: none  FOLLOW UP COMPLIANCE: keeps appointments   PHYSICAL EXAM:  BP 94/61   Pulse 97   Temp 98.4 F (36.9 C)   Resp 14   Ht '5\' 2"'$  (1.575 m)   Wt 172 lb 12.8 oz (78.4 kg)   BMI 31.61 kg/m  Left breast has some bruising around her lumpectomy site no dominant masses noted in either breast.  No axillary or supraclavicular adenopathy is appreciated.  Well-developed well-nourished patient in NAD. HEENT reveals PERLA, EOMI, discs not visualized.  Oral cavity is clear. No oral mucosal lesions are identified. Neck is clear without evidence of cervical or supraclavicular adenopathy. Lungs are clear to A&P. Cardiac examination is essentially unremarkable with regular rate and rhythm without murmur rub or thrill. Abdomen is benign with no organomegaly or masses noted. Motor sensory and DTR levels are equal and symmetric in the upper and lower extremities. Cranial nerves II through XII are grossly intact. Proprioception is intact. No peripheral adenopathy or edema is identified. No motor or sensory levels are noted. Crude visual fields are within normal range.  RADIOLOGY RESULTS: No  current films for review  PLAN: Present time elected ahead with adjuvant radiation therapy to her left breast.  We use hypofractionated course of treatment over 3 weeks with deep inspiration breath-hold.  Risks and benefits of treatment occluding skin reaction fatigue alteration blood count possible inclusion of superficial lung all were described in detail to the patient.  She seems to comprehend my treatment plan well.  I have personally set up and ordered CT simulation for later this week.  I would like to take this opportunity to thank you for allowing me to participate in the care of your patient.Noreene Filbert, MD

## 2023-01-17 ENCOUNTER — Telehealth: Payer: Self-pay | Admitting: *Deleted

## 2023-01-17 NOTE — Telephone Encounter (Signed)
Faxed the order to remove the port at  [facility] surgery and the fax went through

## 2023-01-19 ENCOUNTER — Ambulatory Visit: Payer: Commercial Managed Care - PPO

## 2023-01-20 ENCOUNTER — Other Ambulatory Visit: Payer: Self-pay | Admitting: Nurse Practitioner

## 2023-01-20 ENCOUNTER — Other Ambulatory Visit: Payer: Self-pay

## 2023-01-20 MED ORDER — VALACYCLOVIR HCL 500 MG PO TABS
ORAL_TABLET | ORAL | 1 refills | Status: DC
  Filled 2023-01-20: qty 90, fill #0
  Filled 2023-01-26: qty 90, 80d supply, fill #0
  Filled 2023-04-11: qty 90, 80d supply, fill #1

## 2023-01-23 ENCOUNTER — Other Ambulatory Visit: Payer: Self-pay

## 2023-01-23 ENCOUNTER — Ambulatory Visit
Admission: RE | Admit: 2023-01-23 | Discharge: 2023-01-23 | Disposition: A | Discharge status: Home / Self Care | Payer: Commercial Managed Care - PPO | Source: Ambulatory Visit | Attending: Radiation Oncology | Admitting: Radiation Oncology

## 2023-01-23 DIAGNOSIS — Z510 Encounter for antineoplastic radiation therapy: Secondary | ICD-10-CM | POA: Insufficient documentation

## 2023-01-23 DIAGNOSIS — Z170 Estrogen receptor positive status [ER+]: Secondary | ICD-10-CM | POA: Diagnosis not present

## 2023-01-23 DIAGNOSIS — K219 Gastro-esophageal reflux disease without esophagitis: Secondary | ICD-10-CM | POA: Insufficient documentation

## 2023-01-23 DIAGNOSIS — M797 Fibromyalgia: Secondary | ICD-10-CM | POA: Diagnosis not present

## 2023-01-23 DIAGNOSIS — C50412 Malignant neoplasm of upper-outer quadrant of left female breast: Secondary | ICD-10-CM | POA: Diagnosis not present

## 2023-01-23 DIAGNOSIS — Z79899 Other long term (current) drug therapy: Secondary | ICD-10-CM | POA: Insufficient documentation

## 2023-01-23 DIAGNOSIS — G4700 Insomnia, unspecified: Secondary | ICD-10-CM | POA: Diagnosis not present

## 2023-01-24 ENCOUNTER — Encounter: Payer: Self-pay | Admitting: *Deleted

## 2023-01-24 ENCOUNTER — Other Ambulatory Visit: Payer: Self-pay | Admitting: *Deleted

## 2023-01-24 NOTE — Progress Notes (Signed)
Ms. Saragosa called with a couple questions.   All questions answered to her satisfaction.

## 2023-01-25 ENCOUNTER — Encounter: Payer: Self-pay | Admitting: *Deleted

## 2023-01-25 ENCOUNTER — Other Ambulatory Visit: Payer: Self-pay | Admitting: *Deleted

## 2023-01-25 NOTE — Progress Notes (Signed)
Received a call from Tonia Brooms at [employer] [employer] inquiring about a return to work letter.   Dr. Elroy Channel nurse is working on getting that filled out and will send it as soon as possible.   Called Ms. Waguespack to verify return to work date is the 19th and let Quillian Quince know the letter has been received.

## 2023-01-26 ENCOUNTER — Other Ambulatory Visit: Payer: Self-pay

## 2023-01-26 ENCOUNTER — Telehealth: Payer: Self-pay | Admitting: *Deleted

## 2023-01-26 NOTE — Telephone Encounter (Signed)
Pt wanted to know when it will be done and sent to employer. I got the info from [place] the navigator. It is done and sent to [facility] and the fax transmission went through

## 2023-01-27 ENCOUNTER — Other Ambulatory Visit: Payer: Self-pay | Admitting: *Deleted

## 2023-01-27 DIAGNOSIS — C50412 Malignant neoplasm of upper-outer quadrant of left female breast: Secondary | ICD-10-CM

## 2023-01-30 DIAGNOSIS — G4700 Insomnia, unspecified: Secondary | ICD-10-CM | POA: Diagnosis not present

## 2023-01-30 DIAGNOSIS — Z510 Encounter for antineoplastic radiation therapy: Secondary | ICD-10-CM | POA: Diagnosis not present

## 2023-01-30 DIAGNOSIS — K219 Gastro-esophageal reflux disease without esophagitis: Secondary | ICD-10-CM | POA: Diagnosis not present

## 2023-01-30 DIAGNOSIS — Z79899 Other long term (current) drug therapy: Secondary | ICD-10-CM | POA: Diagnosis not present

## 2023-01-30 DIAGNOSIS — Z170 Estrogen receptor positive status [ER+]: Secondary | ICD-10-CM | POA: Diagnosis not present

## 2023-01-30 DIAGNOSIS — C50412 Malignant neoplasm of upper-outer quadrant of left female breast: Secondary | ICD-10-CM | POA: Diagnosis not present

## 2023-01-30 DIAGNOSIS — M797 Fibromyalgia: Secondary | ICD-10-CM | POA: Diagnosis not present

## 2023-02-01 ENCOUNTER — Ambulatory Visit: Admission: RE | Admit: 2023-02-01 | Payer: Commercial Managed Care - PPO | Source: Ambulatory Visit

## 2023-02-01 DIAGNOSIS — Z79899 Other long term (current) drug therapy: Secondary | ICD-10-CM | POA: Diagnosis not present

## 2023-02-01 DIAGNOSIS — Z510 Encounter for antineoplastic radiation therapy: Secondary | ICD-10-CM | POA: Diagnosis not present

## 2023-02-01 DIAGNOSIS — Z170 Estrogen receptor positive status [ER+]: Secondary | ICD-10-CM | POA: Diagnosis not present

## 2023-02-01 DIAGNOSIS — C50412 Malignant neoplasm of upper-outer quadrant of left female breast: Secondary | ICD-10-CM | POA: Diagnosis not present

## 2023-02-01 DIAGNOSIS — G4700 Insomnia, unspecified: Secondary | ICD-10-CM | POA: Diagnosis not present

## 2023-02-01 DIAGNOSIS — K219 Gastro-esophageal reflux disease without esophagitis: Secondary | ICD-10-CM | POA: Diagnosis not present

## 2023-02-01 DIAGNOSIS — M797 Fibromyalgia: Secondary | ICD-10-CM | POA: Diagnosis not present

## 2023-02-02 ENCOUNTER — Other Ambulatory Visit: Payer: Self-pay

## 2023-02-02 ENCOUNTER — Ambulatory Visit (INDEPENDENT_AMBULATORY_CARE_PROVIDER_SITE_OTHER): Payer: Commercial Managed Care - PPO | Admitting: Licensed Clinical Social Worker

## 2023-02-02 ENCOUNTER — Ambulatory Visit
Admission: RE | Admit: 2023-02-02 | Discharge: 2023-02-02 | Disposition: A | Discharge status: Home / Self Care | Payer: Commercial Managed Care - PPO | Source: Ambulatory Visit | Attending: Radiation Oncology | Admitting: Radiation Oncology

## 2023-02-02 ENCOUNTER — Other Ambulatory Visit: Payer: Self-pay | Admitting: General Surgery

## 2023-02-02 DIAGNOSIS — Z79899 Other long term (current) drug therapy: Secondary | ICD-10-CM | POA: Diagnosis not present

## 2023-02-02 DIAGNOSIS — Z510 Encounter for antineoplastic radiation therapy: Secondary | ICD-10-CM | POA: Diagnosis not present

## 2023-02-02 DIAGNOSIS — F4310 Post-traumatic stress disorder, unspecified: Secondary | ICD-10-CM | POA: Diagnosis not present

## 2023-02-02 DIAGNOSIS — M797 Fibromyalgia: Secondary | ICD-10-CM | POA: Diagnosis not present

## 2023-02-02 DIAGNOSIS — Z170 Estrogen receptor positive status [ER+]: Secondary | ICD-10-CM | POA: Diagnosis not present

## 2023-02-02 DIAGNOSIS — G4700 Insomnia, unspecified: Secondary | ICD-10-CM | POA: Diagnosis not present

## 2023-02-02 DIAGNOSIS — C50412 Malignant neoplasm of upper-outer quadrant of left female breast: Secondary | ICD-10-CM | POA: Diagnosis not present

## 2023-02-02 DIAGNOSIS — K219 Gastro-esophageal reflux disease without esophagitis: Secondary | ICD-10-CM | POA: Diagnosis not present

## 2023-02-02 LAB — RAD ONC ARIA SESSION SUMMARY
Course Elapsed Days: 0
Plan Fractions Treated to Date: 1
Plan Prescribed Dose Per Fraction: 2.66 Gy
Plan Total Fractions Prescribed: 16
Plan Total Prescribed Dose: 42.56 Gy
Reference Point Dosage Given to Date: 2.66 Gy
Reference Point Session Dosage Given: 2.66 Gy
Session Number: 1

## 2023-02-02 NOTE — Progress Notes (Signed)
   THERAPIST PROGRESS NOTE  Session Time: [place] in office visit for patient and LCSW clinician  Participation Level: Active  Behavioral Response: Neat and Well GroomedAlertAnxious and Euthymic  Type of Therapy: Individual Therapy  Treatment Goals addressed: Reduce frequency, intensity, and duration of PTSD symptoms so daily functioning is improved: Input needed on appropriate metric. Pt self report.    ProgressTowards Goals: Progressing  Interventions: CBT and Other: trauma focused  Summary: Nichole Brown is a 57 y.o. female who presents with continuing symptoms related to PTSD/anxiety.  Allowed pt to explore and express thoughts and feelings associated with recent life situations and external stressors.  Allow patient opportunity to identify specific scenarios that have triggered stress and anxiety recently.  Patient states that she is continuing to treat her cancer--patient is currently finished with chemotherapy and is receiving radiation treatments at this time.  Patient reports that she is being intentional about optimism, and " trying to keep a positive mindset in myself and in others".   Patient reports that she is currently involved in a " struggle group" at her church.  Patient reports it has put her in contact with individuals that are struggling with addiction, parents with children struggling with addiction, individual struggling with gambling disorders, etc. patient reports it has been a great platform for her to share her own testimony with others, and be a positive mentor for others.  Patient reports it is a great platform for discussion and exploration.  Encouraged patient to continue engaging in positive social activities, and positive recreational activities.  Patient reports that she is excited about her upcoming trip to [place].    Continued recommendations are as follows: self care behaviors, positive social engagements,  focusing on overall work/home/life balance, and focusing on positive physical and emotional wellness.   Suicidal/Homicidal: No  Therapist Response: Pt is continuing to apply interventions learned in session into daily life situations. Pt is currently on track to meet goals utilizing interventions mentioned above. Personal growth and progress noted. Treatment to continue as indicated.   Pt presents with continuing positive affect and attitude towards overall health related concerns.  Plan: Return again in 4 weeks.  Diagnosis:  Encounter Diagnosis  Name Primary?   PTSD (post-traumatic stress disorder) Yes    Collaboration of Care: Other Pt encouraged to continue psychiatric care with Dr. Modesta Messing  Patient/Guardian was advised Release of Information must be obtained prior to any record release in order to collaborate their care with an outside provider. Patient/Guardian was advised if they have not already done so to contact the registration department to sign all necessary forms in order for [place] to release information regarding their care.   Consent: Patient/Guardian gives verbal consent for treatment and assignment of benefits for services provided during this visit. Patient/Guardian expressed understanding and agreed to proceed.   [place], LCSW 02/02/2023

## 2023-02-03 ENCOUNTER — Ambulatory Visit
Admission: RE | Admit: 2023-02-03 | Discharge: 2023-02-03 | Disposition: A | Discharge status: Home / Self Care | Payer: Commercial Managed Care - PPO | Source: Ambulatory Visit | Attending: Radiation Oncology | Admitting: Radiation Oncology

## 2023-02-03 ENCOUNTER — Other Ambulatory Visit: Payer: Self-pay

## 2023-02-03 DIAGNOSIS — Z79899 Other long term (current) drug therapy: Secondary | ICD-10-CM | POA: Diagnosis not present

## 2023-02-03 DIAGNOSIS — Z170 Estrogen receptor positive status [ER+]: Secondary | ICD-10-CM | POA: Diagnosis not present

## 2023-02-03 DIAGNOSIS — Z510 Encounter for antineoplastic radiation therapy: Secondary | ICD-10-CM | POA: Diagnosis not present

## 2023-02-03 DIAGNOSIS — G4700 Insomnia, unspecified: Secondary | ICD-10-CM | POA: Diagnosis not present

## 2023-02-03 DIAGNOSIS — K219 Gastro-esophageal reflux disease without esophagitis: Secondary | ICD-10-CM | POA: Diagnosis not present

## 2023-02-03 DIAGNOSIS — M797 Fibromyalgia: Secondary | ICD-10-CM | POA: Diagnosis not present

## 2023-02-03 DIAGNOSIS — C50412 Malignant neoplasm of upper-outer quadrant of left female breast: Secondary | ICD-10-CM | POA: Diagnosis not present

## 2023-02-03 LAB — RAD ONC ARIA SESSION SUMMARY
Course Elapsed Days: 1
Plan Fractions Treated to Date: 2
Plan Prescribed Dose Per Fraction: 2.66 Gy
Plan Total Fractions Prescribed: 16
Plan Total Prescribed Dose: 42.56 Gy
Reference Point Dosage Given to Date: 5.32 Gy
Reference Point Session Dosage Given: 2.66 Gy
Session Number: 2

## 2023-02-06 ENCOUNTER — Ambulatory Visit
Admission: RE | Admit: 2023-02-06 | Discharge: 2023-02-06 | Disposition: A | Discharge status: Home / Self Care | Payer: Commercial Managed Care - PPO | Source: Ambulatory Visit | Attending: Radiation Oncology | Admitting: Radiation Oncology

## 2023-02-06 ENCOUNTER — Other Ambulatory Visit: Payer: Self-pay

## 2023-02-06 DIAGNOSIS — C50412 Malignant neoplasm of upper-outer quadrant of left female breast: Secondary | ICD-10-CM | POA: Diagnosis not present

## 2023-02-06 DIAGNOSIS — K219 Gastro-esophageal reflux disease without esophagitis: Secondary | ICD-10-CM | POA: Diagnosis not present

## 2023-02-06 DIAGNOSIS — M797 Fibromyalgia: Secondary | ICD-10-CM | POA: Diagnosis not present

## 2023-02-06 DIAGNOSIS — Z510 Encounter for antineoplastic radiation therapy: Secondary | ICD-10-CM | POA: Diagnosis not present

## 2023-02-06 DIAGNOSIS — G4700 Insomnia, unspecified: Secondary | ICD-10-CM | POA: Diagnosis not present

## 2023-02-06 DIAGNOSIS — Z79899 Other long term (current) drug therapy: Secondary | ICD-10-CM | POA: Diagnosis not present

## 2023-02-06 DIAGNOSIS — Z170 Estrogen receptor positive status [ER+]: Secondary | ICD-10-CM | POA: Diagnosis not present

## 2023-02-06 LAB — RAD ONC ARIA SESSION SUMMARY
Course Elapsed Days: 4
Plan Fractions Treated to Date: 3
Plan Prescribed Dose Per Fraction: 2.66 Gy
Plan Total Fractions Prescribed: 16
Plan Total Prescribed Dose: 42.56 Gy
Reference Point Dosage Given to Date: 7.98 Gy
Reference Point Session Dosage Given: 2.66 Gy
Session Number: 3

## 2023-02-07 ENCOUNTER — Other Ambulatory Visit: Payer: Self-pay

## 2023-02-07 ENCOUNTER — Ambulatory Visit
Admission: RE | Admit: 2023-02-07 | Discharge: 2023-02-07 | Disposition: A | Discharge status: Home / Self Care | Payer: Commercial Managed Care - PPO | Source: Ambulatory Visit | Attending: Radiation Oncology | Admitting: Radiation Oncology

## 2023-02-07 DIAGNOSIS — C50412 Malignant neoplasm of upper-outer quadrant of left female breast: Secondary | ICD-10-CM | POA: Diagnosis not present

## 2023-02-07 DIAGNOSIS — Z79899 Other long term (current) drug therapy: Secondary | ICD-10-CM | POA: Diagnosis not present

## 2023-02-07 DIAGNOSIS — Z170 Estrogen receptor positive status [ER+]: Secondary | ICD-10-CM | POA: Diagnosis not present

## 2023-02-07 DIAGNOSIS — Z510 Encounter for antineoplastic radiation therapy: Secondary | ICD-10-CM | POA: Diagnosis not present

## 2023-02-07 DIAGNOSIS — G4700 Insomnia, unspecified: Secondary | ICD-10-CM | POA: Diagnosis not present

## 2023-02-07 DIAGNOSIS — M797 Fibromyalgia: Secondary | ICD-10-CM | POA: Diagnosis not present

## 2023-02-07 DIAGNOSIS — K219 Gastro-esophageal reflux disease without esophagitis: Secondary | ICD-10-CM | POA: Diagnosis not present

## 2023-02-07 LAB — RAD ONC ARIA SESSION SUMMARY
Course Elapsed Days: 5
Plan Fractions Treated to Date: 4
Plan Prescribed Dose Per Fraction: 2.66 Gy
Plan Total Fractions Prescribed: 16
Plan Total Prescribed Dose: 42.56 Gy
Reference Point Dosage Given to Date: 10.64 Gy
Reference Point Session Dosage Given: 2.66 Gy
Session Number: 4

## 2023-02-08 ENCOUNTER — Other Ambulatory Visit: Payer: Self-pay

## 2023-02-08 ENCOUNTER — Ambulatory Visit
Admission: RE | Admit: 2023-02-08 | Discharge: 2023-02-08 | Disposition: A | Discharge status: Home / Self Care | Payer: Commercial Managed Care - PPO | Source: Ambulatory Visit | Attending: Radiation Oncology | Admitting: Radiation Oncology

## 2023-02-08 DIAGNOSIS — Z79899 Other long term (current) drug therapy: Secondary | ICD-10-CM | POA: Diagnosis not present

## 2023-02-08 DIAGNOSIS — C50412 Malignant neoplasm of upper-outer quadrant of left female breast: Secondary | ICD-10-CM | POA: Diagnosis not present

## 2023-02-08 DIAGNOSIS — Z170 Estrogen receptor positive status [ER+]: Secondary | ICD-10-CM | POA: Diagnosis not present

## 2023-02-08 DIAGNOSIS — Z510 Encounter for antineoplastic radiation therapy: Secondary | ICD-10-CM | POA: Diagnosis not present

## 2023-02-08 DIAGNOSIS — M797 Fibromyalgia: Secondary | ICD-10-CM | POA: Diagnosis not present

## 2023-02-08 DIAGNOSIS — G4700 Insomnia, unspecified: Secondary | ICD-10-CM | POA: Diagnosis not present

## 2023-02-08 DIAGNOSIS — K219 Gastro-esophageal reflux disease without esophagitis: Secondary | ICD-10-CM | POA: Diagnosis not present

## 2023-02-08 LAB — RAD ONC ARIA SESSION SUMMARY
Course Elapsed Days: 6
Plan Fractions Treated to Date: 5
Plan Prescribed Dose Per Fraction: 2.66 Gy
Plan Total Fractions Prescribed: 16
Plan Total Prescribed Dose: 42.56 Gy
Reference Point Dosage Given to Date: 13.3 Gy
Reference Point Session Dosage Given: 2.66 Gy
Session Number: 5

## 2023-02-09 ENCOUNTER — Other Ambulatory Visit: Payer: Self-pay

## 2023-02-09 ENCOUNTER — Encounter: Payer: Self-pay | Admitting: Radiology

## 2023-02-09 ENCOUNTER — Other Ambulatory Visit: Payer: Self-pay | Admitting: *Deleted

## 2023-02-09 ENCOUNTER — Encounter: Payer: Self-pay | Admitting: *Deleted

## 2023-02-09 ENCOUNTER — Ambulatory Visit
Admission: RE | Admit: 2023-02-09 | Discharge: 2023-02-09 | Disposition: A | Discharge status: Home / Self Care | Payer: Commercial Managed Care - PPO | Source: Ambulatory Visit | Attending: Radiation Oncology | Admitting: Radiation Oncology

## 2023-02-09 DIAGNOSIS — M797 Fibromyalgia: Secondary | ICD-10-CM | POA: Diagnosis not present

## 2023-02-09 DIAGNOSIS — C50412 Malignant neoplasm of upper-outer quadrant of left female breast: Secondary | ICD-10-CM | POA: Diagnosis not present

## 2023-02-09 DIAGNOSIS — Z510 Encounter for antineoplastic radiation therapy: Secondary | ICD-10-CM | POA: Diagnosis not present

## 2023-02-09 DIAGNOSIS — Z170 Estrogen receptor positive status [ER+]: Secondary | ICD-10-CM | POA: Diagnosis not present

## 2023-02-09 DIAGNOSIS — G4700 Insomnia, unspecified: Secondary | ICD-10-CM | POA: Diagnosis not present

## 2023-02-09 DIAGNOSIS — Z79899 Other long term (current) drug therapy: Secondary | ICD-10-CM | POA: Diagnosis not present

## 2023-02-09 DIAGNOSIS — K219 Gastro-esophageal reflux disease without esophagitis: Secondary | ICD-10-CM | POA: Diagnosis not present

## 2023-02-09 LAB — RAD ONC ARIA SESSION SUMMARY
Course Elapsed Days: 7
Plan Fractions Treated to Date: 6
Plan Prescribed Dose Per Fraction: 2.66 Gy
Plan Total Fractions Prescribed: 16
Plan Total Prescribed Dose: 42.56 Gy
Reference Point Dosage Given to Date: 15.96 Gy
Reference Point Session Dosage Given: 2.66 Gy
Session Number: 6

## 2023-02-09 NOTE — Progress Notes (Signed)
Nichole Brown called with a couple questions about radiation and blood work.   Answered questions to her satisfaction.

## 2023-02-10 ENCOUNTER — Other Ambulatory Visit: Payer: Self-pay

## 2023-02-10 ENCOUNTER — Ambulatory Visit
Admission: RE | Admit: 2023-02-10 | Discharge: 2023-02-10 | Disposition: A | Discharge status: Home / Self Care | Payer: Commercial Managed Care - PPO | Source: Ambulatory Visit | Attending: Radiation Oncology | Admitting: Radiation Oncology

## 2023-02-10 ENCOUNTER — Ambulatory Visit: Payer: Commercial Managed Care - PPO | Admitting: Family Medicine

## 2023-02-10 ENCOUNTER — Inpatient Hospital Stay: Payer: Commercial Managed Care - PPO

## 2023-02-10 VITALS — BP 103/68 | Temp 97.8°F | Ht 62.0 in | Wt 170.0 lb

## 2023-02-10 DIAGNOSIS — K219 Gastro-esophageal reflux disease without esophagitis: Secondary | ICD-10-CM | POA: Diagnosis not present

## 2023-02-10 DIAGNOSIS — Z510 Encounter for antineoplastic radiation therapy: Secondary | ICD-10-CM | POA: Insufficient documentation

## 2023-02-10 DIAGNOSIS — J988 Other specified respiratory disorders: Secondary | ICD-10-CM | POA: Diagnosis not present

## 2023-02-10 DIAGNOSIS — Z79899 Other long term (current) drug therapy: Secondary | ICD-10-CM | POA: Insufficient documentation

## 2023-02-10 DIAGNOSIS — C50412 Malignant neoplasm of upper-outer quadrant of left female breast: Secondary | ICD-10-CM | POA: Diagnosis not present

## 2023-02-10 DIAGNOSIS — M797 Fibromyalgia: Secondary | ICD-10-CM | POA: Diagnosis not present

## 2023-02-10 DIAGNOSIS — G4700 Insomnia, unspecified: Secondary | ICD-10-CM | POA: Insufficient documentation

## 2023-02-10 DIAGNOSIS — C50411 Malignant neoplasm of upper-outer quadrant of right female breast: Secondary | ICD-10-CM | POA: Insufficient documentation

## 2023-02-10 DIAGNOSIS — Z170 Estrogen receptor positive status [ER+]: Secondary | ICD-10-CM | POA: Diagnosis not present

## 2023-02-10 LAB — RAD ONC ARIA SESSION SUMMARY
Course Elapsed Days: 8
Plan Fractions Treated to Date: 7
Plan Prescribed Dose Per Fraction: 2.66 Gy
Plan Total Fractions Prescribed: 16
Plan Total Prescribed Dose: 42.56 Gy
Reference Point Dosage Given to Date: 18.62 Gy
Reference Point Session Dosage Given: 2.66 Gy
Session Number: 7

## 2023-02-10 LAB — CBC (CANCER CENTER ONLY)
HCT: 36 % (ref 36.0–46.0)
Hemoglobin: 11.5 g/dL — ABNORMAL LOW (ref 12.0–15.0)
MCH: 30.1 pg (ref 26.0–34.0)
MCHC: 31.9 g/dL (ref 30.0–36.0)
MCV: 94.2 fL (ref 80.0–100.0)
Platelet Count: 404 10*3/uL — ABNORMAL HIGH (ref 150–400)
RBC: 3.82 MIL/uL — ABNORMAL LOW (ref 3.87–5.11)
RDW: 15.9 % — ABNORMAL HIGH (ref 11.5–15.5)
WBC Count: 6.4 10*3/uL (ref 4.0–10.5)
nRBC: 0 % (ref 0.0–0.2)

## 2023-02-10 MED ORDER — AMOXICILLIN-POT CLAVULANATE 875-125 MG PO TABS: 1.0000 | ORAL_TABLET | Freq: Two times a day (BID) | ORAL | 0 refills | Status: DC

## 2023-02-10 NOTE — Patient Instructions (Signed)
Antibiotic as prescribed.  Call with concerns.  Take care  Dr. Lacinda Axon

## 2023-02-10 NOTE — Assessment & Plan Note (Signed)
Given immunosuppression and lack of improvement, placing empirically on antibiotic therapy. Augmentin sent to the pharmacy.

## 2023-02-10 NOTE — Progress Notes (Signed)
Subjective:  Patient ID: Nichole Brown, female    DOB: 1966/11/13  Age: 57 y.o. MRN: [id number]  CC: Chief Complaint  Patient presents with   Nasal Congestion    Ear pain, bad taste in phlegm when coough since this Monday- covid was negative last night . Currently radiation after chemo for L breast C    HPI: 57 year old female presents for evaluation of the above.  Patient currently undergoing treatment for breast cancer.  She has completed chemo and is undergoing radiation.  She states that she has been sick for the past week.  She reports cough and congestion.  Also reports bilateral ear pain.  Home COVID test negative.  No fever.  No relief with over-the-counter treatment.  No other complaints or concerns at this time.  Patient Active Problem List   Diagnosis Date Noted   Respiratory infection 02/10/2023   Malignant neoplasm of upper-outer quadrant of left breast in female, estrogen receptor positive ([place]) 08/29/2022   Depression, major, single episode, moderate ([facility]) 01/01/2019   Generalized anxiety disorder 01/01/2019   Gastroesophageal reflux disease 12/26/2016   Migraine headache 07/24/2013   Insomnia 05/30/2013    Social Hx   Social History   Socioeconomic History   Marital status: Married    Spouse name: daniel   Number of children: 4   Years of education: Not on file   Highest education level: Some college, no degree  Occupational History   Not on file  Tobacco Use   Smoking status: Never   Smokeless tobacco: Never  Vaping Use   Vaping Use: Never used  Substance and Sexual Activity   Alcohol use: Not Currently    Comment: occassionally-not since chemo started   Drug use: No   Sexual activity: Yes    Birth control/protection: Surgical    Comment: hyst  Other Topics Concern   Not on file  Social History Narrative   Not on file   Social Determinants of Health   Financial Resource Strain: Low Risk  (07/12/2022)   Overall Financial Resource Strain  (CARDIA)    Difficulty of Paying Living Expenses: Not hard at all  Food Insecurity: No Food Insecurity (07/12/2022)   Hunger Vital Sign    Worried About Running Out of Food in the Last Year: Never true    Ran Out of Food in the Last Year: Never true  Transportation Needs: No Transportation Needs (07/12/2022)   PRAPARE - Hydrologist (Medical): No    Lack of Transportation (Non-Medical): No  Physical Activity: Insufficiently Active (07/12/2022)   Exercise Vital Sign    Days of Exercise per Week: 3 days    Minutes of Exercise per Session: 30 min  Stress: Stress Concern Present (07/12/2022)   [place]    Feeling of Stress : To some extent  Social Connections: Moderately Integrated (07/12/2022)   Social Connection and Isolation Panel [NHANES]    Frequency of Communication with Friends and Family: Once a week    Frequency of Social Gatherings with Friends and Family: Once a week    Attends Religious Services: More than 4 times per year    Active Member of [employer] or Organizations: Yes    Attends Music therapist: More than 4 times per year    Marital Status: Married    Review of Systems Per HPI  Objective:  BP 103/68   Temp 97.8 F (36.6 C)   Ht  $'5\' 2"'I$  (1.575 m)   Wt 170 lb (77.1 kg)   SpO2 99%   BMI 31.09 kg/m      02/10/2023    1:54 PM 01/16/2023    9:43 AM 01/13/2023    8:59 AM  BP/Weight  Systolic BP [id number]  Diastolic BP 68 61 70  Wt. (Lbs) 170 172.8 172  BMI 31.09 kg/m2 31.61 kg/m2 31.46 kg/m2    Physical Exam Vitals and nursing note reviewed.  Constitutional:      General: She is not in acute distress.    Appearance: Normal appearance.  HENT:     Head: Normocephalic and atraumatic.     Right Ear: Tympanic membrane normal.     Left Ear: Tympanic membrane normal.     Mouth/Throat:     Pharynx: Oropharynx is clear.  Eyes:     Conjunctiva/sclera: Conjunctivae  normal.  Cardiovascular:     Rate and Rhythm: Normal rate and regular rhythm.  Pulmonary:     Effort: Pulmonary effort is normal.     Breath sounds: Normal breath sounds. No wheezing, rhonchi or rales.  Neurological:     Mental Status: She is alert.     Lab Results  Component Value Date   WBC 6.4 02/10/2023   HGB 11.5 (L) 02/10/2023   HCT 36.0 02/10/2023   PLT 404 (H) 02/10/2023   GLUCOSE 126 (H) 01/13/2023   CHOL 162 07/23/2019   TRIG 122 07/23/2019   HDL 45 07/23/2019   LDLCALC 93 07/23/2019   ALT 30 01/13/2023   AST 30 01/13/2023   NA 139 01/13/2023   K 3.5 01/13/2023   CL 107 01/13/2023   CREATININE 0.66 01/13/2023   BUN 12 01/13/2023   CO2 24 01/13/2023   TSH 2.206 10/29/2021     Assessment & Plan:   Problem List Items Addressed This Visit       Respiratory   Respiratory infection - Primary    Given immunosuppression and lack of improvement, placing empirically on antibiotic therapy. Augmentin sent to the pharmacy.       Meds ordered this encounter  Medications   amoxicillin-clavulanate (AUGMENTIN) 875-125 MG tablet    Sig: Take 1 tablet by mouth 2 (two) times daily.    Dispense:  14 tablet    Refill:  [place]

## 2023-02-11 ENCOUNTER — Other Ambulatory Visit: Payer: Self-pay

## 2023-02-13 ENCOUNTER — Other Ambulatory Visit: Payer: Self-pay

## 2023-02-13 ENCOUNTER — Ambulatory Visit
Admission: RE | Admit: 2023-02-13 | Discharge: 2023-02-13 | Disposition: A | Discharge status: Home / Self Care | Payer: Commercial Managed Care - PPO | Source: Ambulatory Visit | Attending: Radiation Oncology | Admitting: Radiation Oncology

## 2023-02-13 DIAGNOSIS — Z170 Estrogen receptor positive status [ER+]: Secondary | ICD-10-CM | POA: Diagnosis not present

## 2023-02-13 DIAGNOSIS — Z79899 Other long term (current) drug therapy: Secondary | ICD-10-CM | POA: Diagnosis not present

## 2023-02-13 DIAGNOSIS — C50412 Malignant neoplasm of upper-outer quadrant of left female breast: Secondary | ICD-10-CM | POA: Diagnosis not present

## 2023-02-13 DIAGNOSIS — G4700 Insomnia, unspecified: Secondary | ICD-10-CM | POA: Diagnosis not present

## 2023-02-13 DIAGNOSIS — K219 Gastro-esophageal reflux disease without esophagitis: Secondary | ICD-10-CM | POA: Diagnosis not present

## 2023-02-13 DIAGNOSIS — M797 Fibromyalgia: Secondary | ICD-10-CM | POA: Diagnosis not present

## 2023-02-13 DIAGNOSIS — Z510 Encounter for antineoplastic radiation therapy: Secondary | ICD-10-CM | POA: Diagnosis not present

## 2023-02-13 LAB — RAD ONC ARIA SESSION SUMMARY
Course Elapsed Days: 11
Plan Fractions Treated to Date: 8
Plan Prescribed Dose Per Fraction: 2.66 Gy
Plan Total Fractions Prescribed: 16
Plan Total Prescribed Dose: 42.56 Gy
Reference Point Dosage Given to Date: 21.28 Gy
Reference Point Session Dosage Given: 2.66 Gy
Session Number: 8

## 2023-02-14 ENCOUNTER — Other Ambulatory Visit: Payer: Self-pay

## 2023-02-14 ENCOUNTER — Ambulatory Visit
Admission: RE | Admit: 2023-02-14 | Discharge: 2023-02-14 | Disposition: A | Discharge status: Home / Self Care | Payer: Commercial Managed Care - PPO | Source: Ambulatory Visit | Attending: Radiation Oncology | Admitting: Radiation Oncology

## 2023-02-14 DIAGNOSIS — Z510 Encounter for antineoplastic radiation therapy: Secondary | ICD-10-CM | POA: Diagnosis not present

## 2023-02-14 DIAGNOSIS — Z170 Estrogen receptor positive status [ER+]: Secondary | ICD-10-CM | POA: Diagnosis not present

## 2023-02-14 DIAGNOSIS — C50412 Malignant neoplasm of upper-outer quadrant of left female breast: Secondary | ICD-10-CM | POA: Diagnosis not present

## 2023-02-14 DIAGNOSIS — Z79899 Other long term (current) drug therapy: Secondary | ICD-10-CM | POA: Diagnosis not present

## 2023-02-14 DIAGNOSIS — G4700 Insomnia, unspecified: Secondary | ICD-10-CM | POA: Diagnosis not present

## 2023-02-14 DIAGNOSIS — K219 Gastro-esophageal reflux disease without esophagitis: Secondary | ICD-10-CM | POA: Diagnosis not present

## 2023-02-14 DIAGNOSIS — M797 Fibromyalgia: Secondary | ICD-10-CM | POA: Diagnosis not present

## 2023-02-14 LAB — RAD ONC ARIA SESSION SUMMARY
Course Elapsed Days: 12
Plan Fractions Treated to Date: 9
Plan Prescribed Dose Per Fraction: 2.66 Gy
Plan Total Fractions Prescribed: 16
Plan Total Prescribed Dose: 42.56 Gy
Reference Point Dosage Given to Date: 23.94 Gy
Reference Point Session Dosage Given: 2.66 Gy
Session Number: 9

## 2023-02-14 NOTE — Progress Notes (Signed)
Patient to be re-scheduled d/t recent respiratory infection (see notes from 02/10/23 office visit). Per Dr. Doroteo Glassman, patient must be symptom free for 2 weeks before able to have sx at [facility]. Called Ivy at [facility] to notify of this, and she states she will call pt to re-schedule.

## 2023-02-15 ENCOUNTER — Encounter: Payer: Self-pay | Admitting: Family Medicine

## 2023-02-15 ENCOUNTER — Ambulatory Visit: Payer: Commercial Managed Care - PPO | Admitting: Family Medicine

## 2023-02-15 ENCOUNTER — Ambulatory Visit
Admission: RE | Admit: 2023-02-15 | Discharge: 2023-02-15 | Disposition: A | Discharge status: Home / Self Care | Payer: Commercial Managed Care - PPO | Source: Ambulatory Visit | Attending: Radiation Oncology | Admitting: Radiation Oncology

## 2023-02-15 ENCOUNTER — Other Ambulatory Visit: Payer: Self-pay

## 2023-02-15 DIAGNOSIS — K219 Gastro-esophageal reflux disease without esophagitis: Secondary | ICD-10-CM | POA: Diagnosis not present

## 2023-02-15 DIAGNOSIS — Z170 Estrogen receptor positive status [ER+]: Secondary | ICD-10-CM | POA: Diagnosis not present

## 2023-02-15 DIAGNOSIS — Z79899 Other long term (current) drug therapy: Secondary | ICD-10-CM | POA: Diagnosis not present

## 2023-02-15 DIAGNOSIS — G4700 Insomnia, unspecified: Secondary | ICD-10-CM | POA: Diagnosis not present

## 2023-02-15 DIAGNOSIS — C50412 Malignant neoplasm of upper-outer quadrant of left female breast: Secondary | ICD-10-CM | POA: Diagnosis not present

## 2023-02-15 DIAGNOSIS — M797 Fibromyalgia: Secondary | ICD-10-CM | POA: Diagnosis not present

## 2023-02-15 DIAGNOSIS — Z510 Encounter for antineoplastic radiation therapy: Secondary | ICD-10-CM | POA: Diagnosis not present

## 2023-02-15 LAB — RAD ONC ARIA SESSION SUMMARY
Course Elapsed Days: 13
Plan Fractions Treated to Date: 10
Plan Prescribed Dose Per Fraction: 2.66 Gy
Plan Total Fractions Prescribed: 16
Plan Total Prescribed Dose: 42.56 Gy
Reference Point Dosage Given to Date: 26.6 Gy
Reference Point Session Dosage Given: 2.66 Gy
Session Number: 10

## 2023-02-16 ENCOUNTER — Ambulatory Visit
Admission: RE | Admit: 2023-02-16 | Discharge: 2023-02-16 | Disposition: A | Discharge status: Home / Self Care | Payer: Commercial Managed Care - PPO | Source: Ambulatory Visit | Attending: Radiation Oncology | Admitting: Radiation Oncology

## 2023-02-16 ENCOUNTER — Other Ambulatory Visit: Payer: Self-pay

## 2023-02-16 DIAGNOSIS — C50412 Malignant neoplasm of upper-outer quadrant of left female breast: Secondary | ICD-10-CM | POA: Diagnosis not present

## 2023-02-16 DIAGNOSIS — M797 Fibromyalgia: Secondary | ICD-10-CM | POA: Diagnosis not present

## 2023-02-16 DIAGNOSIS — G4700 Insomnia, unspecified: Secondary | ICD-10-CM | POA: Diagnosis not present

## 2023-02-16 DIAGNOSIS — Z510 Encounter for antineoplastic radiation therapy: Secondary | ICD-10-CM | POA: Diagnosis not present

## 2023-02-16 DIAGNOSIS — Z170 Estrogen receptor positive status [ER+]: Secondary | ICD-10-CM | POA: Diagnosis not present

## 2023-02-16 DIAGNOSIS — Z79899 Other long term (current) drug therapy: Secondary | ICD-10-CM | POA: Diagnosis not present

## 2023-02-16 DIAGNOSIS — K219 Gastro-esophageal reflux disease without esophagitis: Secondary | ICD-10-CM | POA: Diagnosis not present

## 2023-02-16 LAB — RAD ONC ARIA SESSION SUMMARY
Course Elapsed Days: 14
Plan Fractions Treated to Date: 11
Plan Prescribed Dose Per Fraction: 2.66 Gy
Plan Total Fractions Prescribed: 16
Plan Total Prescribed Dose: 42.56 Gy
Reference Point Dosage Given to Date: 29.26 Gy
Reference Point Session Dosage Given: 2.66 Gy
Session Number: 11

## 2023-02-17 ENCOUNTER — Ambulatory Visit: Payer: Self-pay | Admitting: Family Medicine

## 2023-02-17 ENCOUNTER — Ambulatory Visit
Admission: RE | Admit: 2023-02-17 | Discharge: 2023-02-17 | Disposition: A | Discharge status: Home / Self Care | Payer: Commercial Managed Care - PPO | Source: Ambulatory Visit | Attending: Radiation Oncology | Admitting: Radiation Oncology

## 2023-02-17 ENCOUNTER — Other Ambulatory Visit: Payer: Self-pay

## 2023-02-17 DIAGNOSIS — M797 Fibromyalgia: Secondary | ICD-10-CM | POA: Diagnosis not present

## 2023-02-17 DIAGNOSIS — Z510 Encounter for antineoplastic radiation therapy: Secondary | ICD-10-CM | POA: Diagnosis not present

## 2023-02-17 DIAGNOSIS — Z79899 Other long term (current) drug therapy: Secondary | ICD-10-CM | POA: Diagnosis not present

## 2023-02-17 DIAGNOSIS — G4700 Insomnia, unspecified: Secondary | ICD-10-CM | POA: Diagnosis not present

## 2023-02-17 DIAGNOSIS — C50412 Malignant neoplasm of upper-outer quadrant of left female breast: Secondary | ICD-10-CM | POA: Diagnosis not present

## 2023-02-17 DIAGNOSIS — Z170 Estrogen receptor positive status [ER+]: Secondary | ICD-10-CM | POA: Diagnosis not present

## 2023-02-17 DIAGNOSIS — K219 Gastro-esophageal reflux disease without esophagitis: Secondary | ICD-10-CM | POA: Diagnosis not present

## 2023-02-17 LAB — RAD ONC ARIA SESSION SUMMARY
Course Elapsed Days: 15
Plan Fractions Treated to Date: 12
Plan Prescribed Dose Per Fraction: 2.66 Gy
Plan Total Fractions Prescribed: 16
Plan Total Prescribed Dose: 42.56 Gy
Reference Point Dosage Given to Date: 31.92 Gy
Reference Point Session Dosage Given: 2.66 Gy
Session Number: 12

## 2023-02-18 DIAGNOSIS — K219 Gastro-esophageal reflux disease without esophagitis: Secondary | ICD-10-CM | POA: Diagnosis not present

## 2023-02-18 DIAGNOSIS — Z170 Estrogen receptor positive status [ER+]: Secondary | ICD-10-CM | POA: Diagnosis not present

## 2023-02-18 DIAGNOSIS — Z510 Encounter for antineoplastic radiation therapy: Secondary | ICD-10-CM | POA: Diagnosis not present

## 2023-02-18 DIAGNOSIS — M797 Fibromyalgia: Secondary | ICD-10-CM | POA: Diagnosis not present

## 2023-02-18 DIAGNOSIS — C50412 Malignant neoplasm of upper-outer quadrant of left female breast: Secondary | ICD-10-CM | POA: Diagnosis not present

## 2023-02-18 DIAGNOSIS — Z79899 Other long term (current) drug therapy: Secondary | ICD-10-CM | POA: Diagnosis not present

## 2023-02-18 DIAGNOSIS — G4700 Insomnia, unspecified: Secondary | ICD-10-CM | POA: Diagnosis not present

## 2023-02-20 ENCOUNTER — Ambulatory Visit
Admission: RE | Admit: 2023-02-20 | Discharge: 2023-02-20 | Disposition: A | Discharge status: Home / Self Care | Payer: Commercial Managed Care - PPO | Source: Ambulatory Visit | Attending: Radiation Oncology | Admitting: Radiation Oncology

## 2023-02-20 ENCOUNTER — Other Ambulatory Visit: Payer: Self-pay

## 2023-02-20 DIAGNOSIS — M797 Fibromyalgia: Secondary | ICD-10-CM | POA: Diagnosis not present

## 2023-02-20 DIAGNOSIS — Z79899 Other long term (current) drug therapy: Secondary | ICD-10-CM | POA: Diagnosis not present

## 2023-02-20 DIAGNOSIS — Z510 Encounter for antineoplastic radiation therapy: Secondary | ICD-10-CM | POA: Diagnosis not present

## 2023-02-20 DIAGNOSIS — G4700 Insomnia, unspecified: Secondary | ICD-10-CM | POA: Diagnosis not present

## 2023-02-20 DIAGNOSIS — K219 Gastro-esophageal reflux disease without esophagitis: Secondary | ICD-10-CM | POA: Diagnosis not present

## 2023-02-20 DIAGNOSIS — C50412 Malignant neoplasm of upper-outer quadrant of left female breast: Secondary | ICD-10-CM | POA: Diagnosis not present

## 2023-02-20 DIAGNOSIS — Z170 Estrogen receptor positive status [ER+]: Secondary | ICD-10-CM | POA: Diagnosis not present

## 2023-02-20 LAB — RAD ONC ARIA SESSION SUMMARY
Course Elapsed Days: 18
Plan Fractions Treated to Date: 13
Plan Prescribed Dose Per Fraction: 2.66 Gy
Plan Total Fractions Prescribed: 16
Plan Total Prescribed Dose: 42.56 Gy
Reference Point Dosage Given to Date: 34.58 Gy
Reference Point Session Dosage Given: 2.66 Gy
Session Number: 13

## 2023-02-21 ENCOUNTER — Other Ambulatory Visit: Payer: Self-pay

## 2023-02-21 ENCOUNTER — Ambulatory Visit
Admission: RE | Admit: 2023-02-21 | Discharge: 2023-02-21 | Disposition: A | Discharge status: Home / Self Care | Payer: Commercial Managed Care - PPO | Source: Ambulatory Visit | Attending: Radiation Oncology | Admitting: Radiation Oncology

## 2023-02-21 DIAGNOSIS — Z510 Encounter for antineoplastic radiation therapy: Secondary | ICD-10-CM | POA: Diagnosis not present

## 2023-02-21 DIAGNOSIS — Z170 Estrogen receptor positive status [ER+]: Secondary | ICD-10-CM | POA: Diagnosis not present

## 2023-02-21 DIAGNOSIS — K219 Gastro-esophageal reflux disease without esophagitis: Secondary | ICD-10-CM | POA: Diagnosis not present

## 2023-02-21 DIAGNOSIS — G4700 Insomnia, unspecified: Secondary | ICD-10-CM | POA: Diagnosis not present

## 2023-02-21 DIAGNOSIS — M797 Fibromyalgia: Secondary | ICD-10-CM | POA: Diagnosis not present

## 2023-02-21 DIAGNOSIS — Z79899 Other long term (current) drug therapy: Secondary | ICD-10-CM | POA: Diagnosis not present

## 2023-02-21 DIAGNOSIS — C50412 Malignant neoplasm of upper-outer quadrant of left female breast: Secondary | ICD-10-CM | POA: Diagnosis not present

## 2023-02-21 LAB — RAD ONC ARIA SESSION SUMMARY
Course Elapsed Days: 19
Plan Fractions Treated to Date: 14
Plan Prescribed Dose Per Fraction: 2.66 Gy
Plan Total Fractions Prescribed: 16
Plan Total Prescribed Dose: 42.56 Gy
Reference Point Dosage Given to Date: 37.24 Gy
Reference Point Session Dosage Given: 2.66 Gy
Session Number: 14

## 2023-02-22 ENCOUNTER — Ambulatory Visit
Admission: RE | Admit: 2023-02-22 | Discharge: 2023-02-22 | Disposition: A | Discharge status: Home / Self Care | Payer: Commercial Managed Care - PPO | Source: Ambulatory Visit | Attending: Radiation Oncology | Admitting: Radiation Oncology

## 2023-02-22 ENCOUNTER — Other Ambulatory Visit: Payer: Self-pay

## 2023-02-22 DIAGNOSIS — M797 Fibromyalgia: Secondary | ICD-10-CM | POA: Diagnosis not present

## 2023-02-22 DIAGNOSIS — K219 Gastro-esophageal reflux disease without esophagitis: Secondary | ICD-10-CM | POA: Diagnosis not present

## 2023-02-22 DIAGNOSIS — G4700 Insomnia, unspecified: Secondary | ICD-10-CM | POA: Diagnosis not present

## 2023-02-22 DIAGNOSIS — C50412 Malignant neoplasm of upper-outer quadrant of left female breast: Secondary | ICD-10-CM | POA: Diagnosis not present

## 2023-02-22 DIAGNOSIS — Z510 Encounter for antineoplastic radiation therapy: Secondary | ICD-10-CM | POA: Diagnosis not present

## 2023-02-22 DIAGNOSIS — Z79899 Other long term (current) drug therapy: Secondary | ICD-10-CM | POA: Diagnosis not present

## 2023-02-22 DIAGNOSIS — Z170 Estrogen receptor positive status [ER+]: Secondary | ICD-10-CM | POA: Diagnosis not present

## 2023-02-22 LAB — RAD ONC ARIA SESSION SUMMARY
Course Elapsed Days: 20
Plan Fractions Treated to Date: 15
Plan Prescribed Dose Per Fraction: 2.66 Gy
Plan Total Fractions Prescribed: 16
Plan Total Prescribed Dose: 42.56 Gy
Reference Point Dosage Given to Date: 39.9 Gy
Reference Point Session Dosage Given: 2.66 Gy
Session Number: 15

## 2023-02-23 ENCOUNTER — Ambulatory Visit
Admission: RE | Admit: 2023-02-23 | Discharge: 2023-02-23 | Disposition: A | Discharge status: Home / Self Care | Payer: Commercial Managed Care - PPO | Source: Ambulatory Visit | Attending: Radiation Oncology | Admitting: Radiation Oncology

## 2023-02-23 ENCOUNTER — Other Ambulatory Visit: Payer: Self-pay

## 2023-02-23 ENCOUNTER — Ambulatory Visit: Admission: RE | Admit: 2023-02-23 | Payer: Commercial Managed Care - PPO | Source: Ambulatory Visit

## 2023-02-23 DIAGNOSIS — Z510 Encounter for antineoplastic radiation therapy: Secondary | ICD-10-CM | POA: Diagnosis not present

## 2023-02-23 DIAGNOSIS — Z170 Estrogen receptor positive status [ER+]: Secondary | ICD-10-CM | POA: Diagnosis not present

## 2023-02-23 DIAGNOSIS — K219 Gastro-esophageal reflux disease without esophagitis: Secondary | ICD-10-CM | POA: Diagnosis not present

## 2023-02-23 DIAGNOSIS — G4700 Insomnia, unspecified: Secondary | ICD-10-CM | POA: Diagnosis not present

## 2023-02-23 DIAGNOSIS — Z79899 Other long term (current) drug therapy: Secondary | ICD-10-CM | POA: Diagnosis not present

## 2023-02-23 DIAGNOSIS — C50412 Malignant neoplasm of upper-outer quadrant of left female breast: Secondary | ICD-10-CM | POA: Diagnosis not present

## 2023-02-23 DIAGNOSIS — M797 Fibromyalgia: Secondary | ICD-10-CM | POA: Diagnosis not present

## 2023-02-23 LAB — RAD ONC ARIA SESSION SUMMARY
Course Elapsed Days: 21
Plan Fractions Treated to Date: 16
Plan Prescribed Dose Per Fraction: 2.66 Gy
Plan Total Fractions Prescribed: 16
Plan Total Prescribed Dose: 42.56 Gy
Reference Point Dosage Given to Date: 42.56 Gy
Reference Point Session Dosage Given: 2.66 Gy
Session Number: 16

## 2023-02-24 ENCOUNTER — Inpatient Hospital Stay: Payer: Commercial Managed Care - PPO

## 2023-02-24 ENCOUNTER — Other Ambulatory Visit: Payer: Self-pay

## 2023-02-24 ENCOUNTER — Ambulatory Visit
Admission: RE | Admit: 2023-02-24 | Discharge: 2023-02-24 | Disposition: A | Discharge status: Home / Self Care | Payer: Commercial Managed Care - PPO | Source: Ambulatory Visit | Attending: Radiation Oncology | Admitting: Radiation Oncology

## 2023-02-24 ENCOUNTER — Ambulatory Visit: Payer: Commercial Managed Care - PPO | Admitting: Family Medicine

## 2023-02-24 DIAGNOSIS — Z79899 Other long term (current) drug therapy: Secondary | ICD-10-CM | POA: Diagnosis not present

## 2023-02-24 DIAGNOSIS — C50412 Malignant neoplasm of upper-outer quadrant of left female breast: Secondary | ICD-10-CM | POA: Diagnosis not present

## 2023-02-24 DIAGNOSIS — Z510 Encounter for antineoplastic radiation therapy: Secondary | ICD-10-CM | POA: Diagnosis not present

## 2023-02-24 DIAGNOSIS — M797 Fibromyalgia: Secondary | ICD-10-CM | POA: Diagnosis not present

## 2023-02-24 DIAGNOSIS — Z170 Estrogen receptor positive status [ER+]: Secondary | ICD-10-CM | POA: Diagnosis not present

## 2023-02-24 DIAGNOSIS — K219 Gastro-esophageal reflux disease without esophagitis: Secondary | ICD-10-CM | POA: Diagnosis not present

## 2023-02-24 DIAGNOSIS — G4700 Insomnia, unspecified: Secondary | ICD-10-CM | POA: Diagnosis not present

## 2023-02-24 LAB — RAD ONC ARIA SESSION SUMMARY
Course Elapsed Days: 22
Plan Fractions Treated to Date: 1
Plan Prescribed Dose Per Fraction: 2 Gy
Plan Total Fractions Prescribed: 5
Plan Total Prescribed Dose: 10 Gy
Reference Point Dosage Given to Date: 2 Gy
Reference Point Session Dosage Given: 2 Gy
Session Number: 17

## 2023-02-24 LAB — CBC (CANCER CENTER ONLY)
HCT: 37.9 % (ref 36.0–46.0)
Hemoglobin: 12.3 g/dL (ref 12.0–15.0)
MCH: 30.4 pg (ref 26.0–34.0)
MCHC: 32.5 g/dL (ref 30.0–36.0)
MCV: 93.6 fL (ref 80.0–100.0)
Platelet Count: 189 10*3/uL (ref 150–400)
RBC: 4.05 MIL/uL (ref 3.87–5.11)
RDW: 14.2 % (ref 11.5–15.5)
WBC Count: 5.4 10*3/uL (ref 4.0–10.5)
nRBC: 0 % (ref 0.0–0.2)

## 2023-02-27 ENCOUNTER — Ambulatory Visit
Admission: RE | Admit: 2023-02-27 | Discharge: 2023-02-27 | Disposition: A | Discharge status: Home / Self Care | Payer: Commercial Managed Care - PPO | Source: Ambulatory Visit | Attending: Radiation Oncology | Admitting: Radiation Oncology

## 2023-02-27 ENCOUNTER — Other Ambulatory Visit: Payer: Self-pay

## 2023-02-27 DIAGNOSIS — G4700 Insomnia, unspecified: Secondary | ICD-10-CM | POA: Diagnosis not present

## 2023-02-27 DIAGNOSIS — Z510 Encounter for antineoplastic radiation therapy: Secondary | ICD-10-CM | POA: Diagnosis not present

## 2023-02-27 DIAGNOSIS — Z79899 Other long term (current) drug therapy: Secondary | ICD-10-CM | POA: Diagnosis not present

## 2023-02-27 DIAGNOSIS — K219 Gastro-esophageal reflux disease without esophagitis: Secondary | ICD-10-CM | POA: Diagnosis not present

## 2023-02-27 DIAGNOSIS — Z170 Estrogen receptor positive status [ER+]: Secondary | ICD-10-CM | POA: Diagnosis not present

## 2023-02-27 DIAGNOSIS — C50412 Malignant neoplasm of upper-outer quadrant of left female breast: Secondary | ICD-10-CM | POA: Diagnosis not present

## 2023-02-27 DIAGNOSIS — M797 Fibromyalgia: Secondary | ICD-10-CM | POA: Diagnosis not present

## 2023-02-27 LAB — RAD ONC ARIA SESSION SUMMARY
Course Elapsed Days: 25
Plan Fractions Treated to Date: 2
Plan Prescribed Dose Per Fraction: 2 Gy
Plan Total Fractions Prescribed: 5
Plan Total Prescribed Dose: 10 Gy
Reference Point Dosage Given to Date: 4 Gy
Reference Point Session Dosage Given: 2 Gy
Session Number: 18

## 2023-02-28 ENCOUNTER — Other Ambulatory Visit: Payer: Self-pay

## 2023-02-28 ENCOUNTER — Ambulatory Visit
Admission: RE | Admit: 2023-02-28 | Discharge: 2023-02-28 | Disposition: A | Discharge status: Home / Self Care | Payer: Commercial Managed Care - PPO | Source: Ambulatory Visit | Attending: Radiation Oncology | Admitting: Radiation Oncology

## 2023-02-28 DIAGNOSIS — Z510 Encounter for antineoplastic radiation therapy: Secondary | ICD-10-CM | POA: Diagnosis not present

## 2023-02-28 DIAGNOSIS — Z170 Estrogen receptor positive status [ER+]: Secondary | ICD-10-CM | POA: Diagnosis not present

## 2023-02-28 DIAGNOSIS — C50412 Malignant neoplasm of upper-outer quadrant of left female breast: Secondary | ICD-10-CM | POA: Diagnosis not present

## 2023-02-28 DIAGNOSIS — G4700 Insomnia, unspecified: Secondary | ICD-10-CM | POA: Diagnosis not present

## 2023-02-28 DIAGNOSIS — K219 Gastro-esophageal reflux disease without esophagitis: Secondary | ICD-10-CM | POA: Diagnosis not present

## 2023-02-28 DIAGNOSIS — M797 Fibromyalgia: Secondary | ICD-10-CM | POA: Diagnosis not present

## 2023-02-28 DIAGNOSIS — Z79899 Other long term (current) drug therapy: Secondary | ICD-10-CM | POA: Diagnosis not present

## 2023-02-28 LAB — RAD ONC ARIA SESSION SUMMARY
Course Elapsed Days: 26
Plan Fractions Treated to Date: 3
Plan Prescribed Dose Per Fraction: 2 Gy
Plan Total Fractions Prescribed: 5
Plan Total Prescribed Dose: 10 Gy
Reference Point Dosage Given to Date: 6 Gy
Reference Point Session Dosage Given: 2 Gy
Session Number: 19

## 2023-03-01 ENCOUNTER — Ambulatory Visit
Admission: RE | Admit: 2023-03-01 | Discharge: 2023-03-01 | Disposition: A | Discharge status: Home / Self Care | Payer: Commercial Managed Care - PPO | Source: Ambulatory Visit | Attending: Radiation Oncology | Admitting: Radiation Oncology

## 2023-03-01 ENCOUNTER — Other Ambulatory Visit: Payer: Self-pay

## 2023-03-01 DIAGNOSIS — Z170 Estrogen receptor positive status [ER+]: Secondary | ICD-10-CM | POA: Diagnosis not present

## 2023-03-01 DIAGNOSIS — C50412 Malignant neoplasm of upper-outer quadrant of left female breast: Secondary | ICD-10-CM | POA: Diagnosis not present

## 2023-03-01 DIAGNOSIS — Z79899 Other long term (current) drug therapy: Secondary | ICD-10-CM | POA: Diagnosis not present

## 2023-03-01 DIAGNOSIS — Z510 Encounter for antineoplastic radiation therapy: Secondary | ICD-10-CM | POA: Diagnosis not present

## 2023-03-01 DIAGNOSIS — K219 Gastro-esophageal reflux disease without esophagitis: Secondary | ICD-10-CM | POA: Diagnosis not present

## 2023-03-01 DIAGNOSIS — M797 Fibromyalgia: Secondary | ICD-10-CM | POA: Diagnosis not present

## 2023-03-01 DIAGNOSIS — G4700 Insomnia, unspecified: Secondary | ICD-10-CM | POA: Diagnosis not present

## 2023-03-01 LAB — RAD ONC ARIA SESSION SUMMARY
Course Elapsed Days: 27
Plan Fractions Treated to Date: 4
Plan Prescribed Dose Per Fraction: 2 Gy
Plan Total Fractions Prescribed: 5
Plan Total Prescribed Dose: 10 Gy
Reference Point Dosage Given to Date: 8 Gy
Reference Point Session Dosage Given: 2 Gy
Session Number: 20

## 2023-03-02 ENCOUNTER — Other Ambulatory Visit: Payer: Self-pay

## 2023-03-02 ENCOUNTER — Ambulatory Visit
Admission: RE | Admit: 2023-03-02 | Discharge: 2023-03-02 | Disposition: A | Discharge status: Home / Self Care | Payer: Commercial Managed Care - PPO | Source: Ambulatory Visit | Attending: Radiation Oncology | Admitting: Radiation Oncology

## 2023-03-02 DIAGNOSIS — Z79899 Other long term (current) drug therapy: Secondary | ICD-10-CM | POA: Diagnosis not present

## 2023-03-02 DIAGNOSIS — K219 Gastro-esophageal reflux disease without esophagitis: Secondary | ICD-10-CM | POA: Diagnosis not present

## 2023-03-02 DIAGNOSIS — M797 Fibromyalgia: Secondary | ICD-10-CM | POA: Diagnosis not present

## 2023-03-02 DIAGNOSIS — G4700 Insomnia, unspecified: Secondary | ICD-10-CM | POA: Diagnosis not present

## 2023-03-02 DIAGNOSIS — C50412 Malignant neoplasm of upper-outer quadrant of left female breast: Secondary | ICD-10-CM | POA: Diagnosis not present

## 2023-03-02 DIAGNOSIS — Z170 Estrogen receptor positive status [ER+]: Secondary | ICD-10-CM | POA: Diagnosis not present

## 2023-03-02 DIAGNOSIS — Z510 Encounter for antineoplastic radiation therapy: Secondary | ICD-10-CM | POA: Diagnosis not present

## 2023-03-02 LAB — RAD ONC ARIA SESSION SUMMARY
Course Elapsed Days: 28
Plan Fractions Treated to Date: 5
Plan Prescribed Dose Per Fraction: 2 Gy
Plan Total Fractions Prescribed: 5
Plan Total Prescribed Dose: 10 Gy
Reference Point Dosage Given to Date: 10 Gy
Reference Point Session Dosage Given: 2 Gy
Session Number: 21

## 2023-03-03 ENCOUNTER — Encounter: Payer: Self-pay | Admitting: *Deleted

## 2023-03-03 ENCOUNTER — Encounter (HOSPITAL_BASED_OUTPATIENT_CLINIC_OR_DEPARTMENT_OTHER): Payer: Self-pay | Admitting: General Surgery

## 2023-03-03 ENCOUNTER — Other Ambulatory Visit: Payer: Self-pay

## 2023-03-03 NOTE — Progress Notes (Signed)
       Patient Instructions  The night before surgery:  No food after midnight. ONLY clear liquids after midnight  The day of surgery (if you do NOT have diabetes):  Drink ONE (1) Pre-Surgery Clear Ensure as directed.   This drink was given to you during your hospital  pre-op appointment visit. The pre-op nurse will instruct you on the time to drink the  Pre-Surgery Ensure depending on your surgery time. Finish the drink at the designated time by the pre-op nurse.  Nothing else to drink after completing the  Pre-Surgery Clear Ensure.  The day of surgery (if you have diabetes): Drink ONE (1) Gatorade 2 (G2) as directed. This drink was given to you during your hospital  pre-op appointment visit.  The pre-op nurse will instruct you on the time to drink the   Gatorade 2 (G2) depending on your surgery time. Color of the Gatorade may vary. Red is not allowed. Nothing else to drink after completing the  Gatorade 2 (G2).         If you have questions, please contact your surgeon's office.  Patient given instruction to wash with CHG presurgical wash the night before and the morning of surgery.  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

## 2023-03-06 DIAGNOSIS — D239 Other benign neoplasm of skin, unspecified: Secondary | ICD-10-CM | POA: Diagnosis not present

## 2023-03-06 DIAGNOSIS — L570 Actinic keratosis: Secondary | ICD-10-CM | POA: Diagnosis not present

## 2023-03-13 ENCOUNTER — Other Ambulatory Visit: Payer: Self-pay | Admitting: *Deleted

## 2023-03-13 ENCOUNTER — Encounter: Payer: Self-pay | Admitting: *Deleted

## 2023-03-13 DIAGNOSIS — Z170 Estrogen receptor positive status [ER+]: Secondary | ICD-10-CM

## 2023-03-14 ENCOUNTER — Ambulatory Visit (HOSPITAL_BASED_OUTPATIENT_CLINIC_OR_DEPARTMENT_OTHER): Payer: Commercial Managed Care - PPO | Admitting: Anesthesiology

## 2023-03-14 ENCOUNTER — Encounter (HOSPITAL_BASED_OUTPATIENT_CLINIC_OR_DEPARTMENT_OTHER): Payer: Self-pay | Admitting: General Surgery

## 2023-03-14 ENCOUNTER — Inpatient Hospital Stay: Payer: Commercial Managed Care - PPO | Admitting: Oncology

## 2023-03-14 ENCOUNTER — Encounter (HOSPITAL_BASED_OUTPATIENT_CLINIC_OR_DEPARTMENT_OTHER)
Admission: RE | Disposition: A | Discharge status: Home / Self Care | Payer: Self-pay | Source: Home / Self Care | Attending: General Surgery

## 2023-03-14 ENCOUNTER — Ambulatory Visit (HOSPITAL_BASED_OUTPATIENT_CLINIC_OR_DEPARTMENT_OTHER)
Admission: RE | Admit: 2023-03-14 | Discharge: 2023-03-14 | Disposition: A | Discharge status: Home / Self Care | Payer: Commercial Managed Care - PPO | Attending: General Surgery | Admitting: General Surgery

## 2023-03-14 ENCOUNTER — Other Ambulatory Visit: Payer: Self-pay

## 2023-03-14 ENCOUNTER — Inpatient Hospital Stay: Payer: Commercial Managed Care - PPO

## 2023-03-14 DIAGNOSIS — K219 Gastro-esophageal reflux disease without esophagitis: Secondary | ICD-10-CM | POA: Diagnosis not present

## 2023-03-14 DIAGNOSIS — Z9221 Personal history of antineoplastic chemotherapy: Secondary | ICD-10-CM | POA: Insufficient documentation

## 2023-03-14 DIAGNOSIS — Z452 Encounter for adjustment and management of vascular access device: Secondary | ICD-10-CM

## 2023-03-14 DIAGNOSIS — G43909 Migraine, unspecified, not intractable, without status migrainosus: Secondary | ICD-10-CM | POA: Diagnosis not present

## 2023-03-14 DIAGNOSIS — C50919 Malignant neoplasm of unspecified site of unspecified female breast: Secondary | ICD-10-CM

## 2023-03-14 DIAGNOSIS — F418 Other specified anxiety disorders: Secondary | ICD-10-CM | POA: Diagnosis not present

## 2023-03-14 HISTORY — PX: PORT-A-CATH REMOVAL: SHX5289

## 2023-03-14 SURGERY — REMOVAL PORT-A-CATH
Anesthesia: Monitor Anesthesia Care | Site: Chest | Laterality: Right

## 2023-03-14 MED ORDER — BUPIVACAINE HCL (PF) 0.25 % IJ SOLN
INTRAMUSCULAR | Status: DC | PRN
  Administered 2023-03-14: 9 mL

## 2023-03-14 MED ORDER — FENTANYL CITRATE (PF) 100 MCG/2ML IJ SOLN
INTRAMUSCULAR | Status: AC
  Filled 2023-03-14: qty 2

## 2023-03-14 MED ORDER — MIDAZOLAM HCL 2 MG/2ML IJ SOLN
INTRAMUSCULAR | Status: DC | PRN
  Administered 2023-03-14: 2 mg via INTRAVENOUS

## 2023-03-14 MED ORDER — ONDANSETRON HCL 4 MG/2ML IJ SOLN
INTRAMUSCULAR | Status: AC
  Filled 2023-03-14: qty 2

## 2023-03-14 MED ORDER — ACETAMINOPHEN 500 MG PO TABS
1000.0000 mg | ORAL_TABLET | ORAL | Status: AC
  Administered 2023-03-14: 1000 mg via ORAL

## 2023-03-14 MED ORDER — OXYCODONE HCL 5 MG/5ML PO SOLN: 5.0000 mg | Freq: Once | ORAL | Status: DC | PRN

## 2023-03-14 MED ORDER — KETOROLAC TROMETHAMINE 30 MG/ML IJ SOLN: 30.0000 mg | Freq: Once | INTRAMUSCULAR | Status: DC | PRN

## 2023-03-14 MED ORDER — MIDAZOLAM HCL 2 MG/2ML IJ SOLN
INTRAMUSCULAR | Status: AC
  Filled 2023-03-14: qty 2

## 2023-03-14 MED ORDER — LACTATED RINGERS IV SOLN: INTRAVENOUS | Status: DC

## 2023-03-14 MED ORDER — CEFAZOLIN SODIUM-DEXTROSE 2-4 GM/100ML-% IV SOLN
INTRAVENOUS | Status: AC
  Filled 2023-03-14: qty 100

## 2023-03-14 MED ORDER — ACETAMINOPHEN 500 MG PO TABS
ORAL_TABLET | ORAL | Status: AC
  Filled 2023-03-14: qty 2

## 2023-03-14 MED ORDER — FENTANYL CITRATE (PF) 100 MCG/2ML IJ SOLN
INTRAMUSCULAR | Status: DC | PRN
  Administered 2023-03-14: 50 ug via INTRAVENOUS

## 2023-03-14 MED ORDER — PROPOFOL 500 MG/50ML IV EMUL
INTRAVENOUS | Status: DC | PRN
  Administered 2023-03-14: 100 ug/kg/min via INTRAVENOUS

## 2023-03-14 MED ORDER — CHLORHEXIDINE GLUCONATE CLOTH 2 % EX PADS: 6.0000 | MEDICATED_PAD | Freq: Once | CUTANEOUS | Status: DC

## 2023-03-14 MED ORDER — CEFAZOLIN SODIUM-DEXTROSE 2-4 GM/100ML-% IV SOLN
2.0000 g | INTRAVENOUS | Status: AC
  Administered 2023-03-14: 2 g via INTRAVENOUS

## 2023-03-14 MED ORDER — OXYCODONE HCL 5 MG PO TABS: 5.0000 mg | ORAL_TABLET | Freq: Once | ORAL | Status: DC | PRN

## 2023-03-14 MED ORDER — FENTANYL CITRATE (PF) 100 MCG/2ML IJ SOLN: 25.0000 ug | INTRAMUSCULAR | Status: DC | PRN

## 2023-03-14 MED ORDER — AMISULPRIDE (ANTIEMETIC) 5 MG/2ML IV SOLN: 10.0000 mg | Freq: Once | INTRAVENOUS | Status: DC | PRN

## 2023-03-14 MED ORDER — PROPOFOL 10 MG/ML IV BOLUS
INTRAVENOUS | Status: AC
  Filled 2023-03-14: qty 20

## 2023-03-14 MED ORDER — ONDANSETRON HCL 4 MG/2ML IJ SOLN
INTRAMUSCULAR | Status: DC | PRN
  Administered 2023-03-14: 4 mg via INTRAVENOUS

## 2023-03-14 SURGICAL SUPPLY — 32 items
ADH SKN CLS APL DERMABOND .7 (GAUZE/BANDAGES/DRESSINGS) ×1
APL PRP STRL LF DISP 70% ISPRP (MISCELLANEOUS) ×1
BLADE SURG 15 STRL LF DISP TIS (BLADE) ×2 IMPLANT
BLADE SURG 15 STRL SS (BLADE) ×1
CHLORAPREP W/TINT 26 (MISCELLANEOUS) ×2 IMPLANT
COVER BACK TABLE 60X90IN (DRAPES) ×2 IMPLANT
COVER MAYO STAND STRL (DRAPES) ×2 IMPLANT
DERMABOND ADVANCED .7 DNX12 (GAUZE/BANDAGES/DRESSINGS) ×2 IMPLANT
DRAPE LAPAROTOMY 100X72 PEDS (DRAPES) ×2 IMPLANT
DRAPE UTILITY XL STRL (DRAPES) ×2 IMPLANT
ELECT COATED BLADE 2.86 ST (ELECTRODE) ×2 IMPLANT
ELECT REM PT RETURN 9FT ADLT (ELECTROSURGICAL) ×1
ELECTRODE REM PT RTRN 9FT ADLT (ELECTROSURGICAL) ×2 IMPLANT
GLOVE BIO SURGEON STRL SZ7 (GLOVE) ×2 IMPLANT
GLOVE BIOGEL PI IND STRL 6.5 (GLOVE) IMPLANT
GLOVE BIOGEL PI IND STRL 7.0 (GLOVE) IMPLANT
GLOVE BIOGEL PI IND STRL 7.5 (GLOVE) ×2 IMPLANT
GOWN STRL REUS W/ TWL LRG LVL3 (GOWN DISPOSABLE) ×4 IMPLANT
GOWN STRL REUS W/TWL LRG LVL3 (GOWN DISPOSABLE) ×2
NDL HYPO 25X1 1.5 SAFETY (NEEDLE) ×2 IMPLANT
NEEDLE HYPO 25X1 1.5 SAFETY (NEEDLE) ×1 IMPLANT
PACK BASIN DAY SURGERY FS (CUSTOM PROCEDURE TRAY) ×2 IMPLANT
PENCIL SMOKE EVACUATOR (MISCELLANEOUS) ×2 IMPLANT
SLEEVE SCD COMPRESS KNEE MED (STOCKING) IMPLANT
SPIKE FLUID TRANSFER (MISCELLANEOUS) ×2 IMPLANT
SPONGE T-LAP 4X18 ~~LOC~~+RFID (SPONGE) ×2 IMPLANT
STRIP CLOSURE SKIN 1/2X4 (GAUZE/BANDAGES/DRESSINGS) ×2 IMPLANT
SUT MNCRL AB 4-0 PS2 18 (SUTURE) ×2 IMPLANT
SUT VIC AB 3-0 SH 27 (SUTURE) ×1
SUT VIC AB 3-0 SH 27X BRD (SUTURE) ×2 IMPLANT
SYR CONTROL 10ML LL (SYRINGE) ×2 IMPLANT
TOWEL GREEN STERILE FF (TOWEL DISPOSABLE) ×2 IMPLANT

## 2023-03-14 NOTE — Op Note (Signed)
Preoperative diagnosis: Breast cancer, no longer needs venous access Postoperative diagnosis: Same as above Procedure: Port removal Surgeon: Dr. Serita Grammes Anesthesia: General Estimated blood loss: Minimal Complications: None Drains: None Specimens: Sponge and needle count was correct at completion Disposition to recovery in stable condition  Indications: This is a 57 year old female completed therapy for breast cancer no longer needs her port for venous access.  We discussed removal and she desired to do this under sedation.  Procedure: After informed consent was obtained she was taken to the operating room.  She was placed under monitored anesthesia care.  She was prepped and draped in the standard sterile surgical fashion.  Surgical timeout was then performed.  Infiltrated Marcaine throughout the region of the port.  I then reentered her old incision.  I remove the port, line, suture material in their entirety.  Hemostasis was obtained.  I closed this with 3-0 Vicryl and 4 Monocryl.  Glue and a Steri-Strip were applied.  She tolerated this well and was transferred recovery stable.

## 2023-03-14 NOTE — Interval H&P Note (Signed)
History and Physical Interval Note:  03/14/2023 9:19 AM  Nichole Brown  has presented today for surgery, with the diagnosis of BREAST CANCER.  The various methods of treatment have been discussed with the patient and family. After consideration of risks, benefits and other options for treatment, the patient has consented to  Procedure(s): REMOVAL PORT-A-CATH (N/A) as a surgical intervention.  The patient's history has been reviewed, patient examined, no change in status, stable for surgery.  I have reviewed the patient's chart and labs.  Questions were answered to the patient's satisfaction.     Rolm Bookbinder

## 2023-03-14 NOTE — Discharge Instructions (Addendum)
[place] Office Phone Number [phone]  POST OP INSTRUCTIONS Take 400 mg of ibuprofen every 8 hours or 650 mg tylenol every 6 hours for next 72 hours then as needed. Use ice several times daily also.  Take your usually prescribed medications unless otherwise directed  Resume your normal diet  Most patients will experience some swelling and bruising  Ice packs will help.  Marland Kitchen Swelling and bruising can take several days to resolve.  It is common to experience some constipation if taking pain medication after surgery.  Increasing fluid intake and taking a stool softener will usually help or prevent this problem from occurring.  A mild laxative (Milk of Magnesia or Miralax) should be taken according to package directions if there are no bowel movements after 48 hours. I used skin glue on the incision, you may shower in 24 hours.  The glue will flake off over the next 2-3 weeks.  ACTIVITIES:  You may resume regular daily activities (gradually increasing) beginning the next day.  Wearing a good support bra or sports bra minimizes pain and swelling.  You may have sexual intercourse when it is comfortable. You may drive when you no longer are taking prescription pain medication, you can comfortably wear a seatbelt, and you can safely maneuver your car and apply brakes. RETURN TO WORK:  ______________________________________________________________________________________ OTHER INSTRUCTIONS: _______________________________________________________________________________________________ _____________________________________________________________________________________________________________________________________ _____________________________________________________________________________________________________________________________________ _____________________________________________________________________________________________________________________________________  WHEN  TO CALL DR WAKEFIELD: Fever over 101.0 Nausea and/or vomiting. Extreme swelling or bruising. Continued bleeding from incision. Increased pain, redness, or drainage from the incision.  The clinic staff is available to answer your questions during regular business hours.  Please don't hesitate to call and ask to speak to one of the nurses for clinical concerns.  If you have a medical emergency, go to the nearest emergency room or call 911.  A surgeon from [facility] is always on call at the hospital.  For further questions, please visit [email or web address]  You may have Tylenol again after 2pm today, if needed.  Post Anesthesia Home Care Instructions  Activity: Get plenty of rest for the remainder of the day. A responsible individual must stay with you for 24 hours following the procedure.  For the next 24 hours, DO NOT: -Drive a car -Paediatric nurse -Drink alcoholic beverages -Take any medication unless instructed by your physician -Make any legal decisions or sign important papers.  Meals: Start with liquid foods such as gelatin or soup. Progress to regular foods as tolerated. Avoid greasy, spicy, heavy foods. If nausea and/or vomiting occur, drink only clear liquids until the nausea and/or vomiting subsides. Call your physician if vomiting continues.  Special Instructions/Symptoms: Your throat may feel dry or sore from the anesthesia or the breathing tube placed in your throat during surgery. If this causes discomfort, gargle with warm salt water. The discomfort should disappear within 24 hours.  If you had a scopolamine patch placed behind your ear for the management of post- operative nausea and/or vomiting:  1. The medication in the patch is effective for 72 hours, after which it should be removed.  Wrap patch in a tissue and discard in the trash. Wash hands thoroughly with soap and water. 2. You may remove the patch earlier than 72 hours if you  experience unpleasant side effects which may include dry mouth, dizziness or visual disturbances. 3. Avoid touching the patch. Wash your hands with soap and water after contact with the patch.

## 2023-03-14 NOTE — Anesthesia Postprocedure Evaluation (Signed)
Anesthesia Post Note  Patient: Nichole Brown  Procedure(s) Performed: REMOVAL PORT-A-CATH (Right: Chest)     Patient location during evaluation: PACU Anesthesia Type: MAC Level of consciousness: awake Pain management: pain level controlled Vital Signs Assessment: post-procedure vital signs reviewed and stable Respiratory status: spontaneous breathing, nonlabored ventilation and respiratory function stable Cardiovascular status: blood pressure returned to baseline and stable Postop Assessment: no apparent nausea or vomiting Anesthetic complications: no   No notable events documented.  Last Vitals:  Vitals:   03/14/23 1015 03/14/23 1035  BP: 98/67 103/72  Pulse: (!) 59 62  Resp: 13 16  Temp:  36.6 C  SpO2: 99% 96%    Last Pain:  Vitals:   03/14/23 1035  TempSrc:   PainSc: 0-No pain                 Rebbeca Sheperd P [name]

## 2023-03-14 NOTE — Anesthesia Preprocedure Evaluation (Addendum)
Anesthesia Evaluation  Patient identified by MRN, date of birth, ID band Patient awake    Reviewed: Allergy & Precautions, NPO status , Patient's Chart, lab work & pertinent test results  History of Anesthesia Complications (+) PONV and history of anesthetic complications  Airway Mallampati: II  TM Distance: >3 FB Neck ROM: Full    Dental no notable dental hx.    Pulmonary neg pulmonary ROS   Pulmonary exam normal        Cardiovascular negative cardio ROS Normal cardiovascular exam     Neuro/Psych  Headaches PSYCHIATRIC DISORDERS Anxiety Depression       GI/Hepatic Neg liver ROS,GERD  Medicated and Controlled,,  Endo/Other  negative endocrine ROS    Renal/GU negative Renal ROS     Musculoskeletal negative musculoskeletal ROS (+)    Abdominal   Peds  Hematology negative hematology ROS (+)   Anesthesia Other Findings BREAST CANCER  Reproductive/Obstetrics                             Anesthesia Physical Anesthesia Plan  ASA: 2  Anesthesia Plan: MAC   Post-op Pain Management:    Induction: Intravenous  PONV Risk Score and Plan: 3 and Ondansetron, Dexamethasone, Propofol infusion, Midazolam and Treatment may vary due to age or medical condition  Airway Management Planned: Simple Face Mask  Additional Equipment:   Intra-op Plan:   Post-operative Plan:   Informed Consent: I have reviewed the patients History and Physical, chart, labs and discussed the procedure including the risks, benefits and alternatives for the proposed anesthesia with the patient or authorized representative who has indicated his/her understanding and acceptance.     Dental advisory given  Plan Discussed with: CRNA  Anesthesia Plan Comments:        Anesthesia Quick Evaluation

## 2023-03-14 NOTE — H&P (Signed)
Nichole Brown is an 57 y.o. female.   Chief Complaint: breast cancer, no longer needs venous access HPI: 57 year old female status post left lumpectomy and sentinel node biopsy. She is doing well without any complaints. 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 Margins are negative. She has 1 sentinel node that is negative. This was ER positive at greater than 90%, PR positive at 31 to 40%, and HER2 negative. She has undergone Oncotype testing which shows a result of 28 and has now completed chemotherapy. She is ready for port removal.   Past Medical History:  Diagnosis Date   Anxiety    Bronchitis    Cancer 09/2022   left breast IMC   Complication of anesthesia    PONV   Contusion of leg 10/06/2015   Depression    GERD (gastroesophageal reflux disease)    Insomnia    Migraine headache    Pneumonia    2000   PONV (postoperative nausea and vomiting)     Past Surgical History:  Procedure Laterality Date   ABDOMINAL HYSTERECTOMY     BALLOON DILATION N/A 12/14/2020   Procedure: BALLOON DILATION;  Surgeon: Eloise Harman, DO;  [facility];  Service: Endoscopy;  Laterality: N/A;   BIOPSY  12/14/2020   Procedure: BIOPSY;  Surgeon: Eloise Harman, DO;  [facility];  Service: Endoscopy;;   BREAST BIOPSY Left 08/24/2022   [place] bx, coil marker path pending   BREAST LUMPECTOMY WITH RADIOACTIVE SEED AND SENTINEL LYMPH NODE BIOPSY Left 09/29/2022   Procedure: LEFT BREAST LUMPECTOMY WITH RADIOACTIVE SEED AND AXILLARY SENTINEL LYMPH NODE BIOPSY;  Surgeon: Rolm Bookbinder, MD;  Location: [place];  Service: General;  Laterality: Left;   CESAREAN SECTION     CHOLECYSTECTOMY  1988   COLONOSCOPY N/A 12/10/2018    six 4-8 mm polyps in proximal descending colon, mid transverse and hepatic flexure. Diverticulosis. External and internal hemorrhoids. Torturous colon. Two simple adenomas, two serrated polyps, 2  hyperplastic polyps. Surveillance due Dec 2022.    ESOPHAGOGASTRODUODENOSCOPY (EGD) WITH PROPOFOL N/A 12/14/2020   Benign-appearing esophageal stenosis s/p dilation, gastritis, normal duodenum. Mild chronic gastritis.    open heart surgery  1967   PATENT DUCTUS ARTERIOUS REPAIR  1967   POLYPECTOMY  12/10/2018   Procedure: POLYPECTOMY;  Surgeon: Danie Binder, MD;  [facility];  Service: Endoscopy;;  hepatic flexure, descending, transverse   PORTACATH PLACEMENT N/A 11/14/2022   Procedure: PORT PLACEMENT WITH ULTRASOUND GUIDANCE;  Surgeon: Rolm Bookbinder, MD;  Location: [place];  Service: General;  Laterality: N/A;   SHOULDER ARTHROSCOPY Left 06/16/2017   Procedure: LEFT SHOULDER ARTHROSCOPY, DEBRIDEMENT, AND DECOMPRESSION;  Surgeon: Newt Minion, MD;  Location: [facility];  Service: Orthopedics;  Laterality: Left;   SHOULDER ARTHROSCOPY WITH SUBACROMIAL DECOMPRESSION Left 10/03/2019   Procedure: SHOULDER ARTHROSCOPY WITH DEBRIDEMENT, DECOMPRESSION;  Surgeon: Corky Mull, MD;  Location: [facility];  Service: Orthopedics;  Laterality: Left;   SHOULDER SURGERY Left 2008   AC joint    Family History  Problem Relation Age of Onset   Breast cancer Mother 34   Hypertension Father    Colon cancer Paternal Uncle        dx 33s   Hypertension Maternal Grandmother    Diabetes Maternal Grandmother    Heart disease Maternal Grandmother 42       MI   Hypertension Maternal Grandfather    Throat  cancer Maternal Grandfather 17   Social History:  reports that she has never smoked. She has never used smokeless tobacco. She reports that she does not currently use alcohol. She reports that she does not use drugs.  Allergies:  Allergies  Allergen Reactions   Cymbalta [Duloxetine Hcl] Other (See Comments)    Hallucinations; sleep walking   Nortriptyline Nausea And Vomiting   Phenergan [Promethazine Hcl]     Restless legs and cramping    Medications Prior to Admission   Medication Sig Dispense Refill   acetaminophen (TYLENOL) 500 MG tablet Take 500 mg by mouth every 6 (six) hours as needed for moderate pain.     anastrozole (ARIMIDEX) 1 MG tablet Take 1 tablet (1 mg total) by mouth daily. Start after pt finishes radiation. Pt knows 30 tablet 3   CALCIUM PO Take 1 capsule by mouth daily.     famotidine (PEPCID) 20 MG tablet Take 20 mg by mouth 2 (two) times daily.     Lifitegrast (XIIDRA) 5 % SOLN Instill 1 drop into both eyes twice a day 180 each 4   ondansetron (ZOFRAN-ODT) 4 MG disintegrating tablet Take 1 tablet (4 mg total) by mouth every 8 (eight) hours as needed for Nausea migraine 20 tablet 11   pantoprazole (PROTONIX) 20 MG tablet Take 1 tablet (20 mg total) by mouth daily. 30 tablet 3   Probiotic Product (PROBIOTIC PO) Take 1 capsule by mouth daily.     valACYclovir (VALTREX) 500 MG tablet Take 2 tablets daily (1000 mg) by mouth daily for 5 days for acute outbreak. Then take 1 tablet (500 mg) by mouth daily for suppression. Continue during cancer treatments 90 tablet 1   lidocaine-prilocaine (EMLA) cream Apply to affected area once 30 g 3   rizatriptan (MAXALT-MLT) 10 MG disintegrating tablet Take 1 tablet (10 mg total) by mouth daily as needed (may repeat dose once in > 2 hrs) for up to 1 dose 10 tablet 11   topiramate (TOPAMAX) 25 MG tablet Take 1 tablet (25 mg total) by mouth 2 (two) times daily 60 tablet 11   traMADol (ULTRAM) 50 MG tablet Take 1 tablet (50 mg total) by mouth every 6 (six) hours as needed. 10 tablet 0   traZODone (DESYREL) 50 MG tablet Take 1 tablet (50 mg total) by mouth at bedtime as needed for sleep. 90 tablet 1    No results found for this or any previous visit (from the past 48 hour(s)). No results found.  Review of Systems  All other systems reviewed and are negative.   Blood pressure 122/76, pulse 71, temperature (!) 97.5 F (36.4 C), temperature source Oral, resp. rate 18, height 5\' 2"  (1.575 m), weight 75 kg, SpO2  100 %. Physical Exam  General nad Pulm effort normal Cv regular  Right chest port in place  Assessment/Plan Breast cancer, no longer needs venous access -port removal today  Rolm Bookbinder, MD 03/14/2023, 9:18 AM

## 2023-03-14 NOTE — Transfer of Care (Signed)
Immediate Anesthesia Transfer of Care Note  Patient: Nichole Brown  Procedure(s) Performed: REMOVAL PORT-A-CATH (Right: Chest)  Patient Location: PACU  Anesthesia Type:MAC  Level of Consciousness: awake, alert , and oriented  Airway & Oxygen Therapy: Patient Spontanous Breathing and Patient connected to face mask oxygen  Post-op Assessment: Report given to RN and Post -op Vital signs reviewed and stable  Post vital signs: Reviewed and stable  Last Vitals:  Vitals Value Taken Time  BP 101/58 (71)   Temp    Pulse 62 03/14/23 1005  Resp 16 03/14/23 1005  SpO2 98 % 03/14/23 1005  Vitals shown include unvalidated device data.  Last Pain:  Vitals:   03/14/23 0806  TempSrc: Oral  PainSc: 0-No pain      Patients Stated Pain Goal: 0 ([id number])  Complications: No notable events documented.

## 2023-03-15 ENCOUNTER — Encounter (HOSPITAL_BASED_OUTPATIENT_CLINIC_OR_DEPARTMENT_OTHER): Payer: Self-pay | Admitting: General Surgery

## 2023-03-20 ENCOUNTER — Other Ambulatory Visit: Payer: Self-pay | Admitting: Oncology

## 2023-03-22 ENCOUNTER — Inpatient Hospital Stay: Payer: Commercial Managed Care - PPO

## 2023-03-22 ENCOUNTER — Inpatient Hospital Stay (HOSPITAL_BASED_OUTPATIENT_CLINIC_OR_DEPARTMENT_OTHER): Payer: Commercial Managed Care - PPO | Admitting: Oncology

## 2023-03-22 ENCOUNTER — Inpatient Hospital Stay: Payer: Commercial Managed Care - PPO | Attending: Oncology

## 2023-03-22 ENCOUNTER — Encounter: Payer: Self-pay | Admitting: Oncology

## 2023-03-22 VITALS — BP 107/76 | HR 81 | Temp 97.3°F | Resp 18 | Ht 62.0 in | Wt 162.2 lb

## 2023-03-22 DIAGNOSIS — Z5181 Encounter for therapeutic drug level monitoring: Secondary | ICD-10-CM

## 2023-03-22 DIAGNOSIS — C50912 Malignant neoplasm of unspecified site of left female breast: Secondary | ICD-10-CM | POA: Diagnosis not present

## 2023-03-22 DIAGNOSIS — Z79811 Long term (current) use of aromatase inhibitors: Secondary | ICD-10-CM

## 2023-03-22 DIAGNOSIS — K219 Gastro-esophageal reflux disease without esophagitis: Secondary | ICD-10-CM | POA: Diagnosis not present

## 2023-03-22 DIAGNOSIS — Z170 Estrogen receptor positive status [ER+]: Secondary | ICD-10-CM

## 2023-03-22 DIAGNOSIS — Z923 Personal history of irradiation: Secondary | ICD-10-CM | POA: Diagnosis not present

## 2023-03-22 DIAGNOSIS — G4730 Sleep apnea, unspecified: Secondary | ICD-10-CM | POA: Insufficient documentation

## 2023-03-22 DIAGNOSIS — F419 Anxiety disorder, unspecified: Secondary | ICD-10-CM | POA: Insufficient documentation

## 2023-03-22 DIAGNOSIS — Z79899 Other long term (current) drug therapy: Secondary | ICD-10-CM | POA: Diagnosis not present

## 2023-03-22 DIAGNOSIS — G4700 Insomnia, unspecified: Secondary | ICD-10-CM | POA: Diagnosis not present

## 2023-03-22 DIAGNOSIS — Z08 Encounter for follow-up examination after completed treatment for malignant neoplasm: Secondary | ICD-10-CM

## 2023-03-22 DIAGNOSIS — Z7983 Long term (current) use of bisphosphonates: Secondary | ICD-10-CM

## 2023-03-22 DIAGNOSIS — Z853 Personal history of malignant neoplasm of breast: Secondary | ICD-10-CM | POA: Diagnosis not present

## 2023-03-22 LAB — CBC WITH DIFFERENTIAL/PLATELET
Abs Immature Granulocytes: 0.02 10*3/uL (ref 0.00–0.07)
Basophils Absolute: 0.1 10*3/uL (ref 0.0–0.1)
Basophils Relative: 1 %
Eosinophils Absolute: 0.4 10*3/uL (ref 0.0–0.5)
Eosinophils Relative: 7 %
HCT: 37.1 % (ref 36.0–46.0)
Hemoglobin: 12.3 g/dL (ref 12.0–15.0)
Immature Granulocytes: 0 %
Lymphocytes Relative: 37 %
Lymphs Abs: 2.1 10*3/uL (ref 0.7–4.0)
MCH: 30.2 pg (ref 26.0–34.0)
MCHC: 33.2 g/dL (ref 30.0–36.0)
MCV: 91.2 fL (ref 80.0–100.0)
Monocytes Absolute: 0.6 10*3/uL (ref 0.1–1.0)
Monocytes Relative: 10 %
Neutro Abs: 2.6 10*3/uL (ref 1.7–7.7)
Neutrophils Relative %: 45 %
Platelets: 214 10*3/uL (ref 150–400)
RBC: 4.07 MIL/uL (ref 3.87–5.11)
RDW: 12.6 % (ref 11.5–15.5)
WBC: 5.8 10*3/uL (ref 4.0–10.5)
nRBC: 0 % (ref 0.0–0.2)

## 2023-03-22 LAB — COMPREHENSIVE METABOLIC PANEL
ALT: 17 U/L (ref 0–44)
AST: 20 U/L (ref 15–41)
Albumin: 3.8 g/dL (ref 3.5–5.0)
Alkaline Phosphatase: 68 U/L (ref 38–126)
Anion gap: 6 (ref 5–15)
BUN: 11 mg/dL (ref 6–20)
CO2: 26 mmol/L (ref 22–32)
Calcium: 9.3 mg/dL (ref 8.9–10.3)
Chloride: 105 mmol/L (ref 98–111)
Creatinine, Ser: 0.75 mg/dL (ref 0.44–1.00)
GFR, Estimated: >60 mL/min (ref >60)
Glucose, Bld: 114 mg/dL — ABNORMAL HIGH (ref 70–99)
Potassium: 3.6 mmol/L (ref 3.5–5.1)
Sodium: 137 mmol/L (ref 135–145)
Total Bilirubin: 0.6 mg/dL (ref 0.3–1.2)
Total Protein: 6.5 g/dL (ref 6.5–8.1)

## 2023-03-22 MED ORDER — SODIUM CHLORIDE 0.9 % IV SOLN
Freq: Once | INTRAVENOUS | Status: AC
  Filled 2023-03-22: qty 250

## 2023-03-22 MED ORDER — ZOLEDRONIC ACID 4 MG/100ML IV SOLN
4.0000 mg | INTRAVENOUS | Status: DC
  Administered 2023-03-22: 4 mg via INTRAVENOUS
  Filled 2023-03-22: qty 100

## 2023-03-22 NOTE — Progress Notes (Signed)
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  This was followed by a diagnostic mammogram and ultrasound which showed multiple benign cysts noted at the 1 o'clock position.  At the 2 o'clock position 3 cm from the nipple was a oval hypoechoic mass measuring 5 x 4 x 3 mm.  No suspicious left axillary adenopathy.  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   Final pathology showed grade two 7 mm tumor with negative margins.  1 sentinel lymph node negative for malignancy.  Oncotype results showed a recurrence score of 28 and absolute benefit of chemotherapy at greater than 15%.   Patient completed 4 cycles of adjuvant TC chemotherapy in February 2024.  She also completed adjuvant radiation and started taking Arimidex in March 2024.  She is also on adjuvant Zometa  Interval history-patient feels back to her baseline at this time.  She has been on Arimidex for about 3 weeks now  and tolerating it well without any significant side effects.  She is also taking her calcium and vitamin D  ECOG PS- 0 Pain scale- 0   Review of systems- Review of Systems  Constitutional:  Negative for chills, fever, malaise/fatigue and weight loss.  HENT:  Negative for congestion, ear discharge and nosebleeds.   Eyes:  Negative for blurred vision.  Respiratory:  Negative for cough, hemoptysis, sputum production, shortness of breath and wheezing.   Cardiovascular:  Negative for chest pain, palpitations, orthopnea and claudication.  Gastrointestinal:  Negative for abdominal pain, blood in stool, constipation, diarrhea, heartburn, melena, nausea and vomiting.  Genitourinary:  Negative for dysuria, flank pain, frequency, hematuria and urgency.  Musculoskeletal:  Negative for back pain, joint pain and myalgias.  Skin:  Negative for rash.  Neurological:  Negative for dizziness, tingling, focal weakness, seizures, weakness and headaches.  Endo/Heme/Allergies:  Does not bruise/bleed easily.  Psychiatric/Behavioral:  Negative for depression and suicidal ideas. The patient does not have insomnia.       Allergies  Allergen Reactions   Cymbalta [Duloxetine Hcl] Other (See Comments)    Hallucinations; sleep walking   Nortriptyline Nausea And Vomiting   Phenergan [Promethazine Hcl]     Restless legs and cramping     Past Medical History:  Diagnosis Date   Anxiety    Bronchitis    Cancer 09/2022   left breast IMC   Complication of anesthesia    PONV   Contusion of leg 10/06/2015   Depression    GERD (gastroesophageal reflux disease)    Insomnia    Migraine headache  Pneumonia    2000   PONV (postoperative nausea and vomiting)      Past Surgical History:  Procedure Laterality Date   ABDOMINAL HYSTERECTOMY     BALLOON DILATION N/A 12/14/2020   Procedure: BALLOON DILATION;  Surgeon: Lanelle Bal, DO;  [facility];  Service: Endoscopy;  Laterality: N/A;    BIOPSY  12/14/2020   Procedure: BIOPSY;  Surgeon: Lanelle Bal, DO;  [facility];  Service: Endoscopy;;   BREAST BIOPSY Left 08/24/2022   [place] bx, coil marker path pending   BREAST LUMPECTOMY WITH RADIOACTIVE SEED AND SENTINEL LYMPH NODE BIOPSY Left 09/29/2022   Procedure: LEFT BREAST LUMPECTOMY WITH RADIOACTIVE SEED AND AXILLARY SENTINEL LYMPH NODE BIOPSY;  Surgeon: Emelia Loron, MD;  Location: [facility];  Service: General;  Laterality: Left;   CESAREAN SECTION     CHOLECYSTECTOMY  1988   COLONOSCOPY N/A 12/10/2018    six 4-8 mm polyps in proximal descending colon, mid transverse and hepatic flexure. Diverticulosis. External and internal hemorrhoids. Torturous colon. Two simple adenomas, two serrated polyps, 2 hyperplastic polyps. Surveillance due Dec 2022.    ESOPHAGOGASTRODUODENOSCOPY (EGD) WITH PROPOFOL N/A 12/14/2020   Benign-appearing esophageal stenosis s/p dilation, gastritis, normal duodenum. Mild chronic gastritis.    open heart surgery  1967   PATENT DUCTUS ARTERIOUS REPAIR  1967   POLYPECTOMY  12/10/2018   Procedure: POLYPECTOMY;  Surgeon: West Bali, MD;  [facility]: [facility];  Service: Endoscopy;;  hepatic flexure, descending, transverse   PORT-A-CATH REMOVAL Right 03/14/2023   Procedure: REMOVAL PORT-A-CATH;  Surgeon: Emelia Loron, MD;  Location: [facility];  Service: General;  Laterality: Right;   PORTACATH PLACEMENT N/A 11/14/2022   Procedure: PORT PLACEMENT WITH ULTRASOUND GUIDANCE;  Surgeon: Emelia Loron, MD;  Location: [facility];  Service: General;  Laterality: N/A;   SHOULDER ARTHROSCOPY Left 06/16/2017   Procedure: LEFT SHOULDER ARTHROSCOPY, DEBRIDEMENT, AND DECOMPRESSION;  Surgeon: Nadara Mustard, MD;  Location: [facility];  Service: Orthopedics;  Laterality: Left;   SHOULDER ARTHROSCOPY WITH SUBACROMIAL DECOMPRESSION Left 10/03/2019   Procedure: SHOULDER ARTHROSCOPY WITH DEBRIDEMENT,  DECOMPRESSION;  Surgeon: Christena Flake, MD;  Location: [facility];  Service: Orthopedics;  Laterality: Left;   SHOULDER SURGERY Left 2008   [facility] joint    Social History   Socioeconomic History   Marital status: Married    Spouse name: daniel   Number of children: 4   Years of education: Not on file   Highest education level: Some college, no degree  Occupational History   Not on file  Tobacco Use   Smoking status: Never   Smokeless tobacco: Never  Vaping Use   Vaping Use: Never used  Substance and Sexual Activity   Alcohol use: Not Currently    Comment: occassionally-not since chemo started   Drug use: No   Sexual activity: Yes    Birth control/protection: Surgical    Comment: hyst  Other Topics Concern   Not on file  Social History Narrative   Not on file   Social Determinants of Health   Financial Resource Strain: Low Risk  (07/12/2022)   Overall Financial Resource Strain (CARDIA)    Difficulty of Paying Living Expenses: Not hard at all  Food Insecurity: No Food Insecurity (07/12/2022)   Hunger Vital Sign    Worried About Running Out of Food in the Last Year: Never true    Ran Out of Food in the Last Year: Never true  Transportation Needs: No Transportation Needs (07/12/2022)   PRAPARE - Administrator, Civil Service (Medical): No    Lack of Transportation (Non-Medical): No  Physical Activity: Insufficiently Active (07/12/2022)   Exercise Vital Sign    Days of Exercise per Week: 3 days    Minutes of Exercise per Session: 30 min  Stress: Stress Concern Present (07/12/2022)   [employer] of Occupational Health - Occupational Stress Questionnaire    Feeling of Stress : To some extent  Social Connections: Moderately Integrated (07/12/2022)   Social Connection and Isolation Panel [NHANES]    Frequency of Communication with Friends and Family: Once a week    Frequency of Social Gatherings with Friends and Family: Once a week    Attends Religious Services: More  than 4 times per year    Active Member of [employer] or Organizations: Yes    Attends Engineer, structural: More than 4 times per year    Marital Status: Married  Catering manager Violence: Not At Risk (07/12/2022)   Humiliation, Afraid, Rape, and Kick questionnaire    Fear of Current or Ex-Partner: No    Emotionally Abused: No    Physically Abused: No    Sexually Abused: No    Family History  Problem Relation Age of Onset   Breast cancer Mother 78   Hypertension Father    Colon cancer Paternal Uncle        dx 67s   Hypertension Maternal Grandmother    Diabetes Maternal Grandmother    Heart disease Maternal Grandmother 40       MI   Hypertension Maternal Grandfather    Throat cancer Maternal Grandfather 71     Current Outpatient Medications:    acetaminophen (TYLENOL) 500 MG tablet, Take 500 mg by mouth every 6 (six) hours as needed for moderate pain., Disp: , Rfl:    anastrozole (ARIMIDEX) 1 MG tablet, Take 1 tablet (1 mg total) by mouth daily. Start after pt finishes radiation. Pt knows, Disp: 30 tablet, Rfl: 3   CALCIUM PO, Take 1 capsule by mouth daily., Disp: , Rfl:    famotidine (PEPCID) 20 MG tablet, Take 20 mg by mouth 2 (two) times daily., Disp: , Rfl:    lidocaine-prilocaine (EMLA) cream, Apply to affected area once, Disp: 30 g, Rfl: 3   Lifitegrast (XIIDRA) 5 % SOLN, Instill 1 drop into both eyes twice a day, Disp: 180 each, Rfl: 4   ondansetron (ZOFRAN-ODT) 4 MG disintegrating tablet, Take 1 tablet (4 mg total) by mouth every 8 (eight) hours as needed for Nausea migraine, Disp: 20 tablet, Rfl: 11   pantoprazole (PROTONIX) 20 MG tablet, Take 1 tablet (20 mg total) by mouth daily., Disp: 30 tablet, Rfl: 3   Probiotic Product (PROBIOTIC PO), Take 1 capsule by mouth daily., Disp: , Rfl:    rizatriptan (MAXALT-MLT) 10 MG disintegrating tablet, Take 1 tablet (10 mg total) by mouth daily as needed (may repeat dose once in > 2 hrs) for up to 1 dose, Disp: 10 tablet, Rfl:  11   topiramate (TOPAMAX) 25 MG tablet, Take 1 tablet (25 mg total) by mouth 2 (two) times daily, Disp: 60 tablet, Rfl: 11   traMADol (ULTRAM) 50 MG tablet, Take 1 tablet (50 mg total) by mouth every 6 (six) hours as needed., Disp: 10 tablet, Rfl: 0   traZODone (DESYREL) 50 MG tablet, Take 1 tablet (50 mg total) by mouth at bedtime as needed for sleep., Disp: 90 tablet, Rfl: 1  valACYclovir (VALTREX) 500 MG tablet, Take 2 tablets daily (1000 mg) by mouth daily for 5 days for acute outbreak. Then take 1 tablet (500 mg) by mouth daily for suppression. Continue during cancer treatments, Disp: 90 tablet, Rfl: 1 No current facility-administered medications for this visit.  Facility-Administered Medications Ordered in Other Visits:    Zoledronic Acid (ZOMETA) IVPB 4 mg, 4 mg, Intravenous, Q90 days, Creig Hinesao, Krysia Zahradnik C, MD, Stopped at 03/22/23 1446  Physical exam:  Vitals:   03/22/23 1314  BP: 107/76  Pulse: 81  Resp: 18  Temp: (!) 97.3 F (36.3 C)  TempSrc: Tympanic  SpO2: 97%  Weight: 162 lb 3.2 oz (73.6 kg)  Height: 5\' 2"  (1.575 m)   Physical Exam Cardiovascular:     Rate and Rhythm: Normal rate and regular rhythm.     Heart sounds: Normal heart sounds.  Pulmonary:     Effort: Pulmonary effort is normal.     Breath sounds: Normal breath sounds.  Skin:    General: Skin is warm and dry.  Neurological:     Mental Status: She is alert and oriented to person, place, and time.    Breast exam was performed in seated and lying down position. Patient is status post left lumpectomy with a well-healed surgical scar. No evidence of any palpable masses. No evidence of axillary adenopathy. No evidence of any palpable masses or lumps in the right breast. No evidence of right axillary adenopathy      Latest Ref Rng & Units 03/22/2023   12:43 PM  CMP  Glucose 70 - 99 mg/dL 409114   BUN 6 - 20 mg/dL 11   Creatinine 8.110.44 - 1.00 mg/dL 9.140.75   Sodium 782135 - 956145 mmol/L 137   Potassium 3.5 - 5.1 mmol/L 3.6    Chloride 98 - 111 mmol/L 105   CO2 22 - 32 mmol/L 26   Calcium 8.9 - 10.3 mg/dL 9.3   Total Protein 6.5 - 8.1 g/dL 6.5   Total Bilirubin 0.3 - 1.2 mg/dL 0.6   Alkaline Phos 38 - 126 U/L 68   AST 15 - 41 U/L 20   ALT 0 - 44 U/L 17       Latest Ref Rng & Units 03/22/2023   12:43 PM  CBC  WBC 4.0 - 10.5 K/uL 5.8   Hemoglobin 12.0 - 15.0 g/dL 21.312.3   Hematocrit 08.636.0 - 46.0 % 37.1   Platelets 150 - 400 K/uL 214     Assessment and plan- Patient is a 57 y.o. female  with history of pathological prognostic stage Ia invasive mammary carcinoma of the left breast pT1b N0 M0 ER greater than 90% positive, PR 31 to 40% positive and HER2 negative.  She is s/p adjuvant chemotherapy and radiation and currently on Arimidex.  She is here for routine follow-up visit  Clinically patient is doing well with no concerning signs and symptoms of recurrence based on today's exam.  She started taking Arimidex sometime in March 2024 and will take it for at least 5 if not 10 years.  She has baseline osteopenia which we will continue to monitor.  Given that she had high risk of breast cancer recurrence she will also benefit from adjuvant Zometa and we will plan to give it to her every 3 months for 2 years.  Discussed risks and benefits of Zometa including all but not limited to hypocalcemia and osteonecrosis of the jaw.  Patient understands and agrees to proceed as planned.  I will  see her back in 3 months with labs for second dose of Zometa   Visit Diagnosis 1. Encounter for follow-up surveillance of breast cancer   2. Encounter for monitoring zoledronic acid therapy   3. Visit for monitoring Arimidex therapy      Dr. Owens Shark, MD, MPH [facility] [phone] 03/22/2023 4:05 PM

## 2023-03-22 NOTE — Patient Instructions (Addendum)
[facility]  Discharge Instructions: Thank you for choosing [facility] to provide your oncology and hematology care.  If you have a lab appointment with the Cancer Center, please go directly to the Cancer Center and check in at the registration area.  Wear comfortable clothing and clothing appropriate for easy access to any Portacath or PICC line.   We strive to give you quality time with your provider. You may need to reschedule your appointment if you arrive late (15 or more minutes).  Arriving late affects you and other patients whose appointments are after yours.  Also, if you miss three or more appointments without notifying the office, you may be dismissed from the clinic at the provider's discretion.      For prescription refill requests, have your pharmacy contact our office and allow 72 hours for refills to be completed.    Today you received the following chemotherapy and/or immunotherapy agents ZOMETA      To help prevent nausea and vomiting after your treatment, we encourage you to take your nausea medication as directed.  BELOW ARE SYMPTOMS THAT SHOULD BE REPORTED IMMEDIATELY: *FEVER GREATER THAN 100.4 F (38 C) OR HIGHER *CHILLS OR SWEATING *NAUSEA AND VOMITING THAT IS NOT CONTROLLED WITH YOUR NAUSEA MEDICATION *UNUSUAL SHORTNESS OF BREATH *UNUSUAL BRUISING OR BLEEDING *URINARY PROBLEMS (pain or burning when urinating, or frequent urination) *BOWEL PROBLEMS (unusual diarrhea, constipation, pain near the anus) TENDERNESS IN MOUTH AND THROAT WITH OR WITHOUT PRESENCE OF ULCERS (sore throat, sores in mouth, or a toothache) UNUSUAL RASH, SWELLING OR PAIN  UNUSUAL VAGINAL DISCHARGE OR ITCHING   Items with * indicate a potential emergency and should be followed up as soon as possible or go to the Emergency Department if any problems should occur.  Please show the CHEMOTHERAPY ALERT CARD or IMMUNOTHERAPY ALERT CARD at check-in to the  Emergency Department and triage nurse.  Should you have questions after your visit or need to cancel or reschedule your appointment, please contact [facility]  [phone] and follow the prompts.  Office hours are 8:00 a.m. to 4:30 p.m. Monday - Friday. Please note that voicemails left after 4:00 p.m. may not be returned until the following business day.  We are closed weekends and major holidays. You have access to a nurse at all times for urgent questions. Please call the main number to the clinic [phone] and follow the prompts.  For any non-urgent questions, you may also contact your provider using MyChart. We now offer e-Visits for anyone 18 and older to request care online for non-urgent symptoms. For details visit [email or web address].   Also download the MyChart app! Go to the app store, search "MyChart", open the app, select [facility], and log in with your MyChart username and password.  Zoledronic Acid Injection (Cancer) What is this medication? ZOLEDRONIC ACID (ZOE le dron ik AS id) treats high calcium levels in the blood caused by cancer. It may also be used with chemotherapy to treat weakened bones caused by cancer. It works by slowing down the release of calcium from bones. This lowers calcium levels in your blood. It also makes your bones stronger and less likely to break (fracture). It belongs to a group of medications called bisphosphonates. This medicine may be used for other purposes; ask your health care provider or pharmacist if you have questions. COMMON BRAND NAME(S): Zometa, Zometa Powder What should I tell my care team before I take   this medication? They need to know if you have any of these conditions: Dehydration Dental disease Kidney disease Liver disease Low levels of calcium in the blood Lung or breathing disease, such as asthma Receiving steroids, such as dexamethasone or prednisone An unusual or allergic reaction to  zoledronic acid, other medications, foods, dyes, or preservatives Pregnant or trying to get pregnant Breast-feeding How should I use this medication? This medication is injected into a vein. It is given by your care team in a hospital or clinic setting. Talk to your care team about the use of this medication in children. Special care may be needed. Overdosage: If you think you have taken too much of this medicine contact a poison control center or emergency room at once. NOTE: This medicine is only for you. Do not share this medicine with others. What if I miss a dose? Keep appointments for follow-up doses. It is important not to miss your dose. Call your care team if you are unable to keep an appointment. What may interact with this medication? Certain antibiotics given by injection Diuretics, such as bumetanide, furosemide NSAIDs, medications for pain and inflammation, such as ibuprofen or naproxen Teriparatide Thalidomide This list may not describe all possible interactions. Give your health care provider a list of all the medicines, herbs, non-prescription drugs, or dietary supplements you use. Also tell them if you smoke, drink alcohol, or use illegal drugs. Some items may interact with your medicine. What should I watch for while using this medication? Visit your care team for regular checks on your progress. It may be some time before you see the benefit from this medication. Some people who take this medication have severe bone, joint, or muscle pain. This medication may also increase your risk for jaw problems or a broken thigh bone. Tell your care team right away if you have severe pain in your jaw, bones, joints, or muscles. Tell you care team if you have any pain that does not go away or that gets worse. Tell your dentist and dental surgeon that you are taking this medication. You should not have major dental surgery while on this medication. See your dentist to have a dental exam and  fix any dental problems before starting this medication. Take good care of your teeth while on this medication. Make sure you see your dentist for regular follow-up appointments. You should make sure you get enough calcium and vitamin D while you are taking this medication. Discuss the foods you eat and the vitamins you take with your care team. Check with your care team if you have severe diarrhea, nausea, and vomiting, or if you sweat a lot. The loss of too much body fluid may make it dangerous for you to take this medication. You may need bloodwork while taking this medication. Talk to your care team if you wish to become pregnant or think you might be pregnant. This medication can cause serious birth defects. What side effects may I notice from receiving this medication? Side effects that you should report to your care team as soon as possible: Allergic reactions--skin rash, itching, hives, swelling of the face, lips, tongue, or throat Kidney injury--decrease in the amount of urine, swelling of the ankles, hands, or feet Low calcium level--muscle pain or cramps, confusion, tingling, or numbness in the hands or feet Osteonecrosis of the jaw--pain, swelling, or redness in the mouth, numbness of the jaw, poor healing after dental work, unusual discharge from the mouth, visible bones in the mouth   Severe bone, joint, or muscle pain Side effects that usually do not require medical attention (report to your care team if they continue or are bothersome): Constipation Fatigue Fever Loss of appetite Nausea Stomach pain This list may not describe all possible side effects. Call your doctor for medical advice about side effects. You may report side effects to FDA at 1-800-FDA-1088. Where should I keep my medication? This medication is given in a hospital or clinic. It will not be stored at home. NOTE: This sheet is a summary. It may not cover all possible information. If you have questions about this  medicine, talk to your doctor, pharmacist, or health care provider.  2023 Elsevier/Gold Standard (2008-01-19 00:00:00)     

## 2023-03-22 NOTE — Progress Notes (Signed)
Survivorship Care Plan visit completed.  Treatment summary reviewed and given to patient.  ASCO answers booklet reviewed and given to patient.  CARE program and Cancer Transitions discussed with patient along with other resources cancer center offers to patients and caregivers.  Patient verbalized understanding.    

## 2023-03-23 ENCOUNTER — Telehealth: Payer: Self-pay | Admitting: *Deleted

## 2023-03-23 ENCOUNTER — Other Ambulatory Visit: Payer: Self-pay | Admitting: Oncology

## 2023-03-23 DIAGNOSIS — Z08 Encounter for follow-up examination after completed treatment for malignant neoplasm: Secondary | ICD-10-CM

## 2023-03-23 NOTE — Telephone Encounter (Signed)
Patient called reporting that she was told that she may ache with her chemotherapy, but the way she feels today after her treatment yesterday is way beyond what she expected and is asking if this is normal. She states she feels weird and that she aches all over. Please return her call at her work number because her cell does not work at work [phone]

## 2023-03-23 NOTE — Telephone Encounter (Signed)
CAll returned to patient and discussed side effects of Zometa and that she may feel this for up to 72 hours. She thanked me for lettingher know She said that the only thing she can take is Tylenol, I asked if she still has any Tramadol that she was given back in October and she said yes. I told her if it gets to bad that she can toke that and she was glad to hear that

## 2023-03-24 ENCOUNTER — Encounter: Payer: Self-pay | Admitting: Oncology

## 2023-03-24 ENCOUNTER — Other Ambulatory Visit: Payer: Self-pay

## 2023-03-27 ENCOUNTER — Encounter: Payer: Self-pay | Admitting: *Deleted

## 2023-03-27 ENCOUNTER — Telehealth: Payer: Self-pay | Admitting: *Deleted

## 2023-03-27 NOTE — Telephone Encounter (Signed)
Nichole Brown called reporting numbness in big toe since she had her zometa infusion.   She had bone pain for 3 days after infusion but the numbness in the one toe has persisted.   She was unsure if this was neuropathy starting, related to her plantar fascitis or related to zometa infusion.   She has taken tylenol which she states has helped some.   Please advise on further recommendations.

## 2023-03-27 NOTE — Telephone Encounter (Signed)
Nothing more to do for now. Will just continue to monitor for any worsening. Probably unrelated to zometa

## 2023-03-27 NOTE — Progress Notes (Signed)
Called Ms. Inskeep and let her know per Dr. Smith Robert the numbness is probably not related to zometa infusion and monitor for any worsening.  She will let us know if it gets worse.

## 2023-03-28 ENCOUNTER — Other Ambulatory Visit: Payer: Self-pay

## 2023-03-29 ENCOUNTER — Other Ambulatory Visit: Payer: Self-pay

## 2023-03-29 DIAGNOSIS — G43709 Chronic migraine without aura, not intractable, without status migrainosus: Secondary | ICD-10-CM | POA: Diagnosis not present

## 2023-03-29 DIAGNOSIS — R200 Anesthesia of skin: Secondary | ICD-10-CM | POA: Diagnosis not present

## 2023-03-29 DIAGNOSIS — M542 Cervicalgia: Secondary | ICD-10-CM | POA: Diagnosis not present

## 2023-03-29 MED ORDER — PREGABALIN 25 MG PO CAPS
25.0000 mg | ORAL_CAPSULE | Freq: Two times a day (BID) | ORAL | 11 refills | Status: DC
  Filled 2023-03-29 – 2023-06-12 (×2): qty 60, 30d supply, fill #0

## 2023-03-30 ENCOUNTER — Other Ambulatory Visit: Payer: Self-pay

## 2023-03-31 ENCOUNTER — Other Ambulatory Visit: Payer: Self-pay

## 2023-04-03 ENCOUNTER — Ambulatory Visit: Payer: Commercial Managed Care - PPO | Attending: General Surgery

## 2023-04-03 VITALS — Wt 163.5 lb

## 2023-04-03 DIAGNOSIS — Z483 Aftercare following surgery for neoplasm: Secondary | ICD-10-CM

## 2023-04-03 NOTE — Therapy (Signed)
OUTPATIENT PHYSICAL THERAPY SOZO SCREENING NOTE   Patient Name: Nichole Brown MRN: [id number] DOB:[birth year], 57 y.o., female Today's Date: 04/03/2023  PCP: [name], DO REFERRING PROVIDER: Emelia Loron, MD   PT End of Session - 04/03/23 1553     Visit Number 6   # unchanged due to screen only   PT Start Time 1552    PT Stop Time 1556    PT Time Calculation (min) 4 min    Activity Tolerance Patient tolerated treatment well    Behavior During Therapy [facility] for tasks assessed/performed             Past Medical History:  Diagnosis Date   Anxiety    Bronchitis    Cancer 09/2022   left breast [facility]   Complication of anesthesia    PONV   Contusion of leg 10/06/2015   Depression    GERD (gastroesophageal reflux disease)    Insomnia    Migraine headache    Pneumonia    2000   PONV (postoperative nausea and vomiting)    Past Surgical History:  Procedure Laterality Date   ABDOMINAL HYSTERECTOMY     BALLOON DILATION N/A 12/14/2020   Procedure: BALLOON DILATION;  Surgeon: Lanelle Bal, DO;  [facility];  Service: Endoscopy;  Laterality: N/A;   BIOPSY  12/14/2020   Procedure: BIOPSY;  Surgeon: Lanelle Bal, DO;  [facility];  Service: Endoscopy;;   BREAST BIOPSY Left 08/24/2022   [place] bx, coil marker path pending   BREAST LUMPECTOMY WITH RADIOACTIVE SEED AND SENTINEL LYMPH NODE BIOPSY Left 09/29/2022   Procedure: LEFT BREAST LUMPECTOMY WITH RADIOACTIVE SEED AND AXILLARY SENTINEL LYMPH NODE BIOPSY;  Surgeon: Emelia Loron, MD;  Location: [facility];  Service: General;  Laterality: Left;   CESAREAN SECTION     CHOLECYSTECTOMY  1988   COLONOSCOPY N/A 12/10/2018    six 4-8 mm polyps in proximal descending colon, mid transverse and hepatic flexure. Diverticulosis. External and internal hemorrhoids. Torturous colon. Two simple adenomas, two serrated polyps, 2 hyperplastic polyps. Surveillance due Dec 2022.     ESOPHAGOGASTRODUODENOSCOPY (EGD) WITH PROPOFOL N/A 12/14/2020   Benign-appearing esophageal stenosis s/p dilation, gastritis, normal duodenum. Mild chronic gastritis.    open heart surgery  1967   PATENT DUCTUS ARTERIOUS REPAIR  1967   POLYPECTOMY  12/10/2018   Procedure: POLYPECTOMY;  Surgeon: West Bali, MD;  [facility];  Service: Endoscopy;;  hepatic flexure, descending, transverse   PORT-A-CATH REMOVAL Right 03/14/2023   Procedure: REMOVAL PORT-A-CATH;  Surgeon: Emelia Loron, MD;  Location: [facility];  Service: General;  Laterality: Right;   PORTACATH PLACEMENT N/A 11/14/2022   Procedure: PORT PLACEMENT WITH ULTRASOUND GUIDANCE;  Surgeon: Emelia Loron, MD;  Location:  SURGERY CENTER;  Service: General;  Laterality: N/A;   SHOULDER ARTHROSCOPY Left 06/16/2017   Procedure: LEFT SHOULDER ARTHROSCOPY, DEBRIDEMENT, AND DECOMPRESSION;  Surgeon: Nadara Mustard, MD;  Location: [facility];  Service: Orthopedics;  Laterality: Left;   SHOULDER ARTHROSCOPY WITH SUBACROMIAL DECOMPRESSION Left 10/03/2019   Procedure: SHOULDER ARTHROSCOPY WITH DEBRIDEMENT, DECOMPRESSION;  Surgeon: Christena Flake, MD;  Location: [facility] ORS;  Service: Orthopedics;  Laterality: Left;   SHOULDER SURGERY Left 2008   [facility] joint   Patient Active Problem List   Diagnosis Date Noted   Respiratory infection 02/10/2023   Malignant neoplasm of upper-outer quadrant of left breast in female, estrogen receptor positive 08/29/2022   Depression, major, single episode, moderate  01/01/2019   Generalized anxiety disorder 01/01/2019   Gastroesophageal reflux disease 12/26/2016   Migraine headache 07/24/2013   Insomnia 05/30/2013    REFERRING DIAG: left breast cancer at risk for lymphedema  THERAPY DIAG:  Aftercare following surgery for neoplasm  PERTINENT HISTORY: This mass was biopsied and was consistent with invasive mammary carcinoma grade 1 ER positive greater than 90%, PR +31 to 40%  and HER2 negative. Hx of 3 shoulder surgeries on the left prior to surgery.   Had a left lumpectomy and SLNB on 09/29/22.  1 negative lymph node removed   PRECAUTIONS: left UE Lymphedema risk, None  SUBJECTIVE: Pt returns for her 3 month L-Dex screen.   PAIN:  Are you having pain? No  SOZO SCREENING: Patient was assessed today using the SOZO machine to determine the lymphedema index score. This was compared to her baseline score. It was determined that she is within the recommended range when compared to her baseline and no further action is needed at this time. She will continue SOZO screenings. These are done every 3 months for 2 years post operatively followed by every 6 months for 2 years, and then annually.   L-DEX FLOWSHEETS - 04/03/23 1500       L-DEX LYMPHEDEMA SCREENING   Measurement Type Unilateral    L-DEX MEASUREMENT EXTREMITY Upper Extremity    POSITION  Standing    DOMINANT SIDE Right    At Risk Side Left    BASELINE SCORE (UNILATERAL) -3.9    L-DEX SCORE (UNILATERAL) -5    VALUE CHANGE (UNILAT) -1.1               Hermenia Bers, PTA 04/03/2023, 3:55 PM

## 2023-04-05 ENCOUNTER — Ambulatory Visit (HOSPITAL_COMMUNITY): Payer: Commercial Managed Care - PPO | Admitting: Licensed Clinical Social Worker

## 2023-04-05 ENCOUNTER — Ambulatory Visit: Payer: Commercial Managed Care - PPO | Attending: Radiation Oncology | Admitting: Radiation Oncology

## 2023-04-05 DIAGNOSIS — G4700 Insomnia, unspecified: Secondary | ICD-10-CM | POA: Insufficient documentation

## 2023-04-05 DIAGNOSIS — Z79899 Other long term (current) drug therapy: Secondary | ICD-10-CM | POA: Insufficient documentation

## 2023-04-05 DIAGNOSIS — M797 Fibromyalgia: Secondary | ICD-10-CM | POA: Insufficient documentation

## 2023-04-05 DIAGNOSIS — Z170 Estrogen receptor positive status [ER+]: Secondary | ICD-10-CM | POA: Insufficient documentation

## 2023-04-05 DIAGNOSIS — K219 Gastro-esophageal reflux disease without esophagitis: Secondary | ICD-10-CM | POA: Insufficient documentation

## 2023-04-05 DIAGNOSIS — C50412 Malignant neoplasm of upper-outer quadrant of left female breast: Secondary | ICD-10-CM | POA: Insufficient documentation

## 2023-04-05 DIAGNOSIS — Z510 Encounter for antineoplastic radiation therapy: Secondary | ICD-10-CM | POA: Insufficient documentation

## 2023-04-11 ENCOUNTER — Other Ambulatory Visit: Payer: Self-pay

## 2023-04-12 ENCOUNTER — Telehealth: Payer: Self-pay | Admitting: *Deleted

## 2023-04-12 NOTE — Telephone Encounter (Signed)
Patient called reporting that she is having new symptoms since starting the anastrozole. She has joint and muscle aches, headache, nausea, stomach pain, flatulence, moody and depressed. Please advise

## 2023-04-12 NOTE — Telephone Encounter (Signed)
Call returned to patient and informed per Dr Smith Robert to stop the Anastrozole and let Nichole Brown know how she feels in 3 weeks and that we will start her on a new medicine at that time. She repeated back to me, after asking if she was safe to stop it for that long. I explained reasoning for stopping it that long and that we do this frequently when patients have problems with AI's.

## 2023-04-12 NOTE — Telephone Encounter (Signed)
Stop anastrozole and let Nichole Brown know how she is feeling in 3 weeks. We will send her an alternate drug

## 2023-04-14 ENCOUNTER — Other Ambulatory Visit: Payer: Self-pay

## 2023-04-19 ENCOUNTER — Other Ambulatory Visit: Payer: Self-pay | Admitting: *Deleted

## 2023-04-19 ENCOUNTER — Telehealth: Payer: Self-pay | Admitting: *Deleted

## 2023-04-19 ENCOUNTER — Other Ambulatory Visit: Payer: Self-pay

## 2023-04-19 MED ORDER — EXEMESTANE 25 MG PO TABS
25.0000 mg | ORAL_TABLET | Freq: Every day | ORAL | 3 refills | Status: DC
  Filled 2023-04-19: qty 30, 30d supply, fill #0
  Filled 2023-05-19: qty 30, 30d supply, fill #1
  Filled 2023-06-12: qty 30, 30d supply, fill #2
  Filled 2023-07-25: qty 30, 30d supply, fill #3

## 2023-04-19 NOTE — Telephone Encounter (Signed)
Pt states she is calling per Jimmy Footman, RN request to give update regarding holding the anastrozole for 1 week.  Pt reports since holding medication she has not had the stomach issues and the "aching in bones has improved".  Please call patient cell number and leave message or call work number to advise on how to proceed with medication.

## 2023-04-19 NOTE — Telephone Encounter (Signed)
Switch to aromasin

## 2023-04-24 ENCOUNTER — Encounter: Payer: Self-pay | Admitting: Radiation Oncology

## 2023-04-24 ENCOUNTER — Ambulatory Visit
Admission: RE | Admit: 2023-04-24 | Discharge: 2023-04-24 | Disposition: A | Discharge status: Home / Self Care | Payer: Commercial Managed Care - PPO | Source: Ambulatory Visit | Attending: Radiation Oncology | Admitting: Radiation Oncology

## 2023-04-24 ENCOUNTER — Other Ambulatory Visit: Payer: Self-pay

## 2023-04-24 VITALS — BP 93/70 | HR 84 | Temp 97.0°F | Resp 17 | Wt 161.0 lb

## 2023-04-24 DIAGNOSIS — C50412 Malignant neoplasm of upper-outer quadrant of left female breast: Secondary | ICD-10-CM | POA: Insufficient documentation

## 2023-04-24 NOTE — Progress Notes (Signed)
Radiation Oncology Follow up Note  Name: Nichole Brown   Date:   04/24/2023 MRN:  [id number] DOB: 1966-02-08    This 57 y.o. female presents to the clinic today for 1 month follow-up status post whole breast radiation to her left breast for stage Ia (pT1b N0 M0) ER/PR positive invasive mammary carcinoma.  REFERRING PROVIDER: [name], DO  HPI: Patient is a 57 year old female now out 1 month having completed whole breast radiation to her left breast for stage Ia ER/PR positive invasive mammary carcinoma.  Seen today in routine follow-up she is doing well.  She specifically denies breast tenderness cough or bone pain..  She is currently on Arimidex although she has some questions about side effect profile which she will address with medical oncology.  She also has some tenderness in her left axilla consistent with her sentinel node biopsy.  COMPLICATIONS OF TREATMENT: none  FOLLOW UP COMPLIANCE: keeps appointments   PHYSICAL EXAM:  BP 93/70 (Patient Position: Sitting)   Pulse 84   Temp (!) 97 F (36.1 C) (Tympanic)   Resp 17   Wt 161 lb (73 kg)   SpO2 100%   BMI 29.45 kg/m  Lungs are clear to A&P cardiac examination essentially unremarkable with regular rate and rhythm. No dominant mass or nodularity is noted in either breast in 2 positions examined. Incision is well-healed. No axillary or supraclavicular adenopathy is appreciated. Cosmetic result is excellent.  Well-developed well-nourished patient in NAD. HEENT reveals PERLA, EOMI, discs not visualized.  Oral cavity is clear. No oral mucosal lesions are identified. Neck is clear without evidence of cervical or supraclavicular adenopathy. Lungs are clear to A&P. Cardiac examination is essentially unremarkable with regular rate and rhythm without murmur rub or thrill. Abdomen is benign with no organomegaly or masses noted. Motor sensory and DTR levels are equal and symmetric in the upper and lower extremities. Cranial nerves II  through XII are grossly intact. Proprioception is intact. No peripheral adenopathy or edema is identified. No motor or sensory levels are noted. Crude visual fields are within normal range.  RADIOLOGY RESULTS: No current films for review  PLAN: Present time and is doing well 1 month out from whole breast radiation and pleased with her overall progress.  Of asked to see her back in 6 months for follow-up.  She will continue endocrine therapy through medical oncology.  Patient is to call with any concerns.  I would like to take this opportunity to thank you for allowing me to participate in the care of your patient.Carmina Miller, MD

## 2023-04-25 ENCOUNTER — Other Ambulatory Visit: Payer: Self-pay

## 2023-04-26 DIAGNOSIS — M79604 Pain in right leg: Secondary | ICD-10-CM | POA: Diagnosis not present

## 2023-04-26 DIAGNOSIS — G5732 Lesion of lateral popliteal nerve, left lower limb: Secondary | ICD-10-CM | POA: Diagnosis not present

## 2023-04-26 DIAGNOSIS — M79605 Pain in left leg: Secondary | ICD-10-CM | POA: Diagnosis not present

## 2023-04-26 DIAGNOSIS — M25552 Pain in left hip: Secondary | ICD-10-CM | POA: Diagnosis not present

## 2023-04-26 DIAGNOSIS — G5731 Lesion of lateral popliteal nerve, right lower limb: Secondary | ICD-10-CM | POA: Diagnosis not present

## 2023-04-26 DIAGNOSIS — G6289 Other specified polyneuropathies: Secondary | ICD-10-CM | POA: Diagnosis not present

## 2023-04-26 DIAGNOSIS — M25551 Pain in right hip: Secondary | ICD-10-CM | POA: Diagnosis not present

## 2023-05-03 ENCOUNTER — Encounter: Payer: Self-pay | Admitting: Oncology

## 2023-05-03 ENCOUNTER — Ambulatory Visit (INDEPENDENT_AMBULATORY_CARE_PROVIDER_SITE_OTHER): Payer: Commercial Managed Care - PPO | Admitting: Licensed Clinical Social Worker

## 2023-05-03 DIAGNOSIS — F4310 Post-traumatic stress disorder, unspecified: Secondary | ICD-10-CM

## 2023-05-04 NOTE — Progress Notes (Signed)
   THERAPIST PROGRESS NOTE  Session Time: 8-9a  Behavioral Health Outpatient [facility] in office visit for patient and LCSW clinician  Participation Level: Active  Behavioral Response: Neat and Well GroomedAlertAnxious and Euthymic  Type of Therapy: Individual Therapy  Treatment Goals addressed: Reduce frequency, intensity, and duration of PTSD symptoms so daily functioning is improved: Input needed on appropriate metric. Pt self report.    ProgressTowards Goals: Progressing  Interventions: CBT and Other: trauma focused  Summary: Nichole Brown is a 57 y.o. female who presents with continuing symptoms related to PTSD/anxiety. Pt reporting poor quality and quantity of sleep recently. Pt has trazodone--encouraged pt to be compliant with medication, especially if it could support improved sleep quality.  Explored current levels of mood and overall energy. Discussed depression symptoms--currently pt reports that she is feeling more sad at times (more sad than usual).  Used motivational interviewing techniques to encourage pt to increase activity through the day, focus on overall prosocial behaviors, and engage in cognitively stimulating activities. Discussed cognitive distortions, automatic negative thoughts, and discussed using positive self talk and reframing.   Pt reports ongoing medical concerns including effects of recent cancer treatments including chemotherapy. Pt is aware of the side effects of the chemo and how it may manifest months down the road.   Allowed pt to identify current symptoms, treatments, and future treatment plans/recommendations.Clinician and patient continued to discuss problem solving regarding complex medical history, implementation and practice of relaxation activities and self-care, addressing cognitive distortions, addressing impact of current medications, and encouraging patient to communicate thoughts and feelings to medical professionals, and anyone else in her  inner circle. Discussed ongoing psychological impact of pt medical history with her cancer diagnosis and treatment.     Continued recommendations are as follows: self care behaviors, positive social engagements, focusing on overall work/home/life balance, and focusing on positive physical and emotional wellness.   Suicidal/Homicidal: No  Therapist Response: Pt is continuing to apply interventions learned in session into daily life situations. Pt is currently on track to meet goals utilizing interventions mentioned above. Personal growth and progress noted. Treatment to continue as indicated.   Pt presents with continuing positive affect and attitude towards overall health related concerns.  Plan: Return again in 4 weeks.  Diagnosis:  Encounter Diagnosis  Name Primary?   PTSD (post-traumatic stress disorder) Yes    Collaboration of Care: Other Pt encouraged to continue psychiatric care with Dr. Vanetta Shawl  Patient/Guardian was advised Release of Information must be obtained prior to any record release in order to collaborate their care with an outside provider. Patient/Guardian was advised if they have not already done so to contact the registration department to sign all necessary forms in order for [place] to release information regarding their care.   Consent: Patient/Guardian gives verbal consent for treatment and assignment of benefits for services provided during this visit. Patient/Guardian expressed understanding and agreed to proceed.   Ernest Haber Chakia Counts, LCSW 05/04/2023

## 2023-05-05 DIAGNOSIS — G5731 Lesion of lateral popliteal nerve, right lower limb: Secondary | ICD-10-CM | POA: Diagnosis not present

## 2023-05-10 DIAGNOSIS — D485 Neoplasm of uncertain behavior of skin: Secondary | ICD-10-CM | POA: Diagnosis not present

## 2023-05-10 DIAGNOSIS — Z1283 Encounter for screening for malignant neoplasm of skin: Secondary | ICD-10-CM | POA: Diagnosis not present

## 2023-05-10 DIAGNOSIS — L218 Other seborrheic dermatitis: Secondary | ICD-10-CM | POA: Diagnosis not present

## 2023-05-18 ENCOUNTER — Other Ambulatory Visit: Payer: Self-pay

## 2023-05-18 ENCOUNTER — Ambulatory Visit (INDEPENDENT_AMBULATORY_CARE_PROVIDER_SITE_OTHER): Payer: Commercial Managed Care - PPO | Admitting: Family Medicine

## 2023-05-18 DIAGNOSIS — F5101 Primary insomnia: Secondary | ICD-10-CM

## 2023-05-18 DIAGNOSIS — F411 Generalized anxiety disorder: Secondary | ICD-10-CM | POA: Diagnosis not present

## 2023-05-18 DIAGNOSIS — Z170 Estrogen receptor positive status [ER+]: Secondary | ICD-10-CM | POA: Diagnosis not present

## 2023-05-18 DIAGNOSIS — C50412 Malignant neoplasm of upper-outer quadrant of left female breast: Secondary | ICD-10-CM | POA: Diagnosis not present

## 2023-05-18 DIAGNOSIS — F321 Major depressive disorder, single episode, moderate: Secondary | ICD-10-CM | POA: Diagnosis not present

## 2023-05-18 DIAGNOSIS — K219 Gastro-esophageal reflux disease without esophagitis: Secondary | ICD-10-CM

## 2023-05-18 MED ORDER — TIZANIDINE HCL 4 MG PO CAPS
4.0000 mg | ORAL_CAPSULE | Freq: Three times a day (TID) | ORAL | 3 refills | Status: DC
  Filled 2023-05-18: qty 90, 30d supply, fill #0
  Filled 2023-09-22: qty 90, 30d supply, fill #1
  Filled 2023-11-18: qty 90, 30d supply, fill #2
  Filled 2023-12-24: qty 90, 30d supply, fill #3

## 2023-05-18 MED ORDER — TRAZODONE HCL 50 MG PO TABS
50.0000 mg | ORAL_TABLET | Freq: Every evening | ORAL | 1 refills | Status: DC | PRN
  Filled 2023-05-18: qty 90, 90d supply, fill #0

## 2023-05-18 NOTE — Patient Instructions (Signed)
Continue your medication.  Follow up in 6 months. 

## 2023-05-18 NOTE — Progress Notes (Signed)
Subjective:  Patient ID: Nichole Brown, female    DOB: [birth year]  Age: 57 y.o. MRN: [id number]  CC: Chief Complaint  Patient presents with   Follow-up    Trazodone Refill Tizanidine    HPI:  57 year old female presents for follow up.  Doing well. Has finished with cancer treatment. She is quite happy about this. Congratulated on this milestone. Compliant with Exemestane.  Anxiety and depression are current stable.  Insomnia stable. Needs refill on Trazodone.    Patient Active Problem List   Diagnosis Date Noted   Malignant neoplasm of upper-outer quadrant of left breast in female, estrogen receptor positive (HCC) 08/29/2022   Depression, major, single episode, moderate (HCC) 01/01/2019   Generalized anxiety disorder 01/01/2019   Gastroesophageal reflux disease 12/26/2016   Migraine headache 07/24/2013   Insomnia 05/30/2013    Social Hx   Social History   Socioeconomic History   Marital status: Married    Spouse name: daniel   Number of children: 4   Years of education: Not on file   Highest education level: Some college, no degree  Occupational History   Not on file  Tobacco Use   Smoking status: Never   Smokeless tobacco: Never  Vaping Use   Vaping Use: Never used  Substance and Sexual Activity   Alcohol use: Not Currently    Comment: occassionally-not since chemo started   Drug use: No   Sexual activity: Yes    Birth control/protection: Surgical    Comment: hyst  Other Topics Concern   Not on file  Social History Narrative   Not on file   Social Determinants of Health   Financial Resource Strain: Low Risk  (05/15/2023)   Overall Financial Resource Strain (CARDIA)    Difficulty of Paying Living Expenses: Not hard at all  Food Insecurity: No Food Insecurity (05/15/2023)   Hunger Vital Sign    Worried About Running Out of Food in the Last Year: Never true    Ran Out of Food in the Last Year: Never true  Transportation Needs: No Transportation  Needs (05/15/2023)   PRAPARE - Administrator, Civil Service (Medical): No    Lack of Transportation (Non-Medical): No  Physical Activity: Sufficiently Active (05/15/2023)   Exercise Vital Sign    Days of Exercise per Week: 5 days    Minutes of Exercise per Session: 30 min  Stress: Stress Concern Present (05/15/2023)   [employer] of Occupational Health - Occupational Stress Questionnaire    Feeling of Stress : Rather much  Social Connections: Socially Integrated (05/15/2023)   Social Connection and Isolation Panel [NHANES]    Frequency of Communication with Friends and Family: Three times a week    Frequency of Social Gatherings with Friends and Family: Three times a week    Attends Religious Services: More than 4 times per year    Active Member of Clubs or Organizations: Yes    Attends Banker Meetings: 1 to 4 times per year    Marital Status: Married    Review of Systems Per HPI  Objective:  BP 120/78   Ht 5\' 2"  (1.575 m)   Wt 162 lb 9.6 oz (73.8 kg)   BMI 29.74 kg/m      05/18/2023    2:14 PM 04/24/2023    9:00 AM 04/03/2023    3:52 PM  BP/Weight  Systolic BP 120 93   Diastolic BP 78 70   Wt. (Lbs) 162.6 161  163.5  BMI 29.74 kg/m2 29.45 kg/m2 29.9 kg/m2    Physical Exam Vitals and nursing note reviewed.  Constitutional:      General: She is not in acute distress.    Appearance: Normal appearance.  HENT:     Head: Normocephalic and atraumatic.  Eyes:     General:        Right eye: No discharge.        Left eye: No discharge.     Conjunctiva/sclera: Conjunctivae normal.  Cardiovascular:     Rate and Rhythm: Normal rate and regular rhythm.  Pulmonary:     Effort: Pulmonary effort is normal.     Breath sounds: Normal breath sounds. No wheezing, rhonchi or rales.  Neurological:     Mental Status: She is alert.  Psychiatric:        Mood and Affect: Mood normal.        Behavior: Behavior normal.     Lab Results  Component Value Date    WBC 5.8 03/22/2023   HGB 12.3 03/22/2023   HCT 37.1 03/22/2023   PLT 214 03/22/2023   GLUCOSE 114 (H) 03/22/2023   CHOL 162 07/23/2019   TRIG 122 07/23/2019   HDL 45 07/23/2019   LDLCALC 93 07/23/2019   ALT 17 03/22/2023   AST 20 03/22/2023   NA 137 03/22/2023   K 3.6 03/22/2023   CL 105 03/22/2023   CREATININE 0.75 03/22/2023   BUN 11 03/22/2023   CO2 26 03/22/2023   TSH 2.206 10/29/2021     Assessment & Plan:   Problem List Items Addressed This Visit       Digestive   Gastroesophageal reflux disease    Stable on Protonix. Continue.        Other   Malignant neoplasm of upper-outer quadrant of left breast in female, estrogen receptor positive (HCC)    Doing well. Continue Exemestane.      Insomnia    Stable. Continue Trazodone. Refilled today.       Generalized anxiety disorder    Stable.      Relevant Medications   traZODone (DESYREL) 50 MG tablet   Depression, major, single episode, moderate (HCC)    Stable.       Relevant Medications   traZODone (DESYREL) 50 MG tablet    Meds ordered this encounter  Medications   traZODone (DESYREL) 50 MG tablet    Sig: Take 1 tablet (50 mg total) by mouth at bedtime as needed for sleep.    Dispense:  90 tablet    Refill:  1   tiZANidine (ZANAFLEX) 4 MG capsule    Sig: Take 1 capsule (4 mg total) by mouth 3 (three) times daily.    Dispense:  90 capsule    Refill:  3    Follow-up:  6 months  Nolita Kutter Adriana Simas DO [facility]

## 2023-05-19 ENCOUNTER — Other Ambulatory Visit: Payer: Self-pay

## 2023-05-19 ENCOUNTER — Telehealth: Payer: Self-pay

## 2023-05-19 NOTE — Telephone Encounter (Signed)
Patient had an appt on 05/18/23 please advise which labs are needed for patient, thanks

## 2023-05-19 NOTE — Telephone Encounter (Signed)
Pt is wanting labs ordered and sent to [facility] where she works to have them done.   Loleta Dicker [phone]

## 2023-05-21 NOTE — Assessment & Plan Note (Signed)
Stable

## 2023-05-21 NOTE — Assessment & Plan Note (Signed)
Stable on Protonix.  Continue. 

## 2023-05-21 NOTE — Assessment & Plan Note (Signed)
Stable. Continue Trazodone. Refilled today.

## 2023-05-21 NOTE — Assessment & Plan Note (Signed)
Doing well. Continue Exemestane.

## 2023-05-22 ENCOUNTER — Other Ambulatory Visit: Payer: Self-pay

## 2023-05-22 ENCOUNTER — Telehealth: Payer: Self-pay | Admitting: Family Medicine

## 2023-05-22 ENCOUNTER — Other Ambulatory Visit
Admission: RE | Admit: 2023-05-22 | Discharge: 2023-05-22 | Disposition: A | Discharge status: Home / Self Care | Payer: Commercial Managed Care - PPO | Source: Ambulatory Visit | Attending: Family Medicine | Admitting: Family Medicine

## 2023-05-22 DIAGNOSIS — R7301 Impaired fasting glucose: Secondary | ICD-10-CM | POA: Insufficient documentation

## 2023-05-22 DIAGNOSIS — Z13220 Encounter for screening for lipoid disorders: Secondary | ICD-10-CM | POA: Insufficient documentation

## 2023-05-22 LAB — LIPID PANEL
Cholesterol: 175 mg/dL (ref 0–200)
HDL: 46 mg/dL (ref >40)
LDL Cholesterol: 73 mg/dL (ref 0–99)
Total CHOL/HDL Ratio: 3.8 RATIO
Triglycerides: 282 mg/dL — ABNORMAL HIGH (ref <150)
VLDL: 56 mg/dL — ABNORMAL HIGH (ref 0–40)

## 2023-05-22 LAB — HEMOGLOBIN A1C
Hgb A1c MFr Bld: 6 % — ABNORMAL HIGH (ref 4.8–5.6)
Mean Plasma Glucose: 125.5 mg/dL

## 2023-05-22 NOTE — Telephone Encounter (Signed)
Patient would like to know lab results when they come in.

## 2023-05-23 NOTE — Telephone Encounter (Signed)
Patient would like to know lab results when they come in

## 2023-05-23 NOTE — Telephone Encounter (Signed)
A1c is 6.0 this is in the prediabetes range healthy diet recommended minimizing starch and sugars Cholesterol profile shows total 175 triglycerides slightly elevated at 282 LDL good at 73 no medication indicated currently.  Healthy diet best approach.  Dr. Adriana Simas may have additional recommendations once he reviews the labs.  Patient has a follow-up office visit with Eber Jones toward the end of the month.  Thanks-Dr. Lorin Picket

## 2023-05-24 ENCOUNTER — Ambulatory Visit (INDEPENDENT_AMBULATORY_CARE_PROVIDER_SITE_OTHER): Payer: Commercial Managed Care - PPO | Admitting: Licensed Clinical Social Worker

## 2023-05-24 DIAGNOSIS — F4310 Post-traumatic stress disorder, unspecified: Secondary | ICD-10-CM

## 2023-05-24 NOTE — Progress Notes (Signed)
   THERAPIST PROGRESS NOTE  Session Time: 8-9a  Behavioral Health Outpatient [facility] in office visit for patient and LCSW clinician  Participation Level: Active  Behavioral Response: Neat and Well GroomedAlertAnxious and Euthymic  Type of Therapy: Individual Therapy  Treatment Goals addressed: Reduce frequency, intensity, and duration of PTSD symptoms so daily functioning is improved: Input needed on appropriate metric. Pt self report.    ProgressTowards Goals: Progressing  Interventions: CBT and Other: trauma focused  Summary: Nichole Brown is a 57 y.o. female who presents with continuing symptoms related to PTSD/anxiety. Pt reporting occasional insomnia--wakes up early and hard to fall back to sleep. Pt compliant w/ trazodone.  Pt reports that she feels her mood is stabilizing.  Clinician assisted pt with identifying situations/scenarios/schemas triggering anxiety and/or depression symptoms. Allowed pt to explore and express thoughts and feelings and discussed current coping mechanisms. Reviewed changes/recommendations.  Discussed pts ongoing medical treatments post-cancer treatment. Pt is feeling good about her overall physical health progress.   Explored current family-based issues/concerns. Pt reports that one of the main anxiety triggers related to family relationships is ongoing conflict between herself and stepdaughter. Discussed current ways that pt and family members are communicating, and discussed positive changes. Allowed pt to explore family members and assisted pt with identifying behavior patterns in self and others.  Discussed current conflict resolution and problem solving behaviors and discussed changes that could improve future conflict/problem solving with family members. Pt reflects understanding and is cooperative. Pt reports good relationship between self and husband.     Continued recommendations are as follows: self care behaviors, positive social engagements,  focusing on overall work/home/life balance, and focusing on positive physical and emotional wellness.   Suicidal/Homicidal: No  Therapist Response: Pt is continuing to apply interventions learned in session into daily life situations. Pt is currently on track to meet goals utilizing interventions mentioned above. Personal growth and progress noted. Treatment to continue as indicated.   Pt presents with continuing positive affect and attitude towards overall health related concerns.  Plan: Return again in 4 weeks.  Diagnosis:  Encounter Diagnosis  Name Primary?   PTSD (post-traumatic stress disorder) Yes    Collaboration of Care: Other Pt encouraged to continue psychiatric care with Dr. Vanetta Shawl  Patient/Guardian was advised Release of Information must be obtained prior to any record release in order to collaborate their care with an outside provider. Patient/Guardian was advised if they have not already done so to contact the registration department to sign all necessary forms in order for [place] to release information regarding their care.   Consent: Patient/Guardian gives verbal consent for treatment and assignment of benefits for services provided during this visit. Patient/Guardian expressed understanding and agreed to proceed.   Ernest Haber Vira Chaplin, LCSW 05/24/2023

## 2023-05-25 NOTE — Telephone Encounter (Signed)
Patient has been informed of her results and recommendations. She states will continue to implement a healthy diet and activity into her day as tolerated.

## 2023-05-30 DIAGNOSIS — R200 Anesthesia of skin: Secondary | ICD-10-CM | POA: Diagnosis not present

## 2023-06-07 ENCOUNTER — Ambulatory Visit (INDEPENDENT_AMBULATORY_CARE_PROVIDER_SITE_OTHER): Payer: Commercial Managed Care - PPO | Admitting: Nurse Practitioner

## 2023-06-07 VITALS — BP 93/60 | HR 89 | Ht 62.0 in | Wt 161.6 lb

## 2023-06-07 DIAGNOSIS — Z01419 Encounter for gynecological examination (general) (routine) without abnormal findings: Secondary | ICD-10-CM | POA: Diagnosis not present

## 2023-06-07 NOTE — Patient Instructions (Signed)
Omega 3 supplement 

## 2023-06-07 NOTE — Progress Notes (Unsigned)
Subjective:    Patient ID: Nichole Brown, female    DOB: 1966/04/08, 57 y.o.   MRN: [id number]  HPI The patient comes in today for a wellness visit.    A review of their health history was completed.  A review of medications was also completed.  Any needed refills; no  Eating habits: good   Falls/  MVA accidents in past few months: no  Regular exercise: 30 mins  a day   Specialist pt sees on regular basis: oncologist, neurologist Dr Kelli Hope ([facility]),  psychologist    Preventative health issues were discussed.   Sees oncology on a regular basis for follow-up on breast cancer.  Mammograms and bone densities are done through oncology. Married, same sexual partner. Regular vision and dental care. Overall healthy diet, continues to work on losing weight. Has an active job in addition does 30 minutes of moderate exercise several days a week.  Review of Systems  Constitutional:  Negative for activity change, appetite change and fatigue.  HENT:  Negative for sore throat and trouble swallowing.   Respiratory:  Negative for cough, chest tightness, shortness of breath and wheezing.   Cardiovascular:  Negative for chest pain.  Gastrointestinal:  Negative for abdominal distention, abdominal pain, constipation, diarrhea, nausea and vomiting.  Genitourinary:  Negative for difficulty urinating, dysuria, enuresis, frequency, genital sores, pelvic pain, urgency and vaginal discharge.       Denies any rash lesions or irritation in the vulvar area.  States she is also had a BSO in addition to hysterectomy.      06/07/2023    2:22 PM  Depression screen PHQ 2/9  Decreased Interest 1  Down, Depressed, Hopeless 1  PHQ - 2 Score 2  Altered sleeping 2  Tired, decreased energy 2  Change in appetite 1  Trouble concentrating 1  Moving slowly or fidgety/restless 0  Suicidal thoughts 0  PHQ-9 Score 8  Difficult doing work/chores Not difficult at all      06/07/2023    2:25 PM  07/12/2022    1:39 PM 10/20/2020   11:16 AM 01/01/2019    1:18 PM  GAD 7 : Generalized Anxiety Score  Nervous, Anxious, on Edge 1  2 3   Control/stop worrying 0 1 2 2   Worry too much - different things 0 1 2 2   Trouble relaxing 1 1 2 2   Restless 0 1 2 2   Easily annoyed or irritable 0 1 2 1   Afraid - awful might happen 1 2 2 3   Total GAD 7 Score 3  14 15   Anxiety Difficulty Not difficult at all  Somewhat difficult Not difficult at all         Objective:   Physical Exam Vitals and nursing note reviewed.  Constitutional:      General: She is not in acute distress.    Appearance: She is well-developed.  Neck:     Thyroid: No thyromegaly.     Trachea: No tracheal deviation.     Comments: Thyroid non tender to palpation. No mass or goiter noted.  Cardiovascular:     Rate and Rhythm: Normal rate and regular rhythm.     Heart sounds: Normal heart sounds. No murmur heard. Pulmonary:     Effort: Pulmonary effort is normal.     Breath sounds: Normal breath sounds.  Chest:  Breasts:    Right: No swelling, inverted nipple, mass, skin change or tenderness.     Left: No swelling, inverted nipple, mass, skin  change or tenderness.  Abdominal:     General: There is no distension.     Palpations: Abdomen is soft.     Tenderness: There is no abdominal tenderness.  Musculoskeletal:     Cervical back: Normal range of motion and neck supple.  Lymphadenopathy:     Cervical: No cervical adenopathy.     Upper Body:     Right upper body: No supraclavicular, axillary or pectoral adenopathy.     Left upper body: No supraclavicular, axillary or pectoral adenopathy.  Skin:    General: Skin is warm and dry.     Findings: No rash.  Neurological:     Mental Status: She is alert and oriented to person, place, and time.  Psychiatric:        Mood and Affect: Mood normal.        Behavior: Behavior normal.        Thought Content: Thought content normal.        Judgment: Judgment normal.    Today's  Vitals   06/07/23 1410  BP: 93/60  Pulse: 89  SpO2: 97%  Weight: 161 lb 9.6 oz (73.3 kg)  Height: 5\' 2"  (1.575 m)   Body mass index is 29.56 kg/m. See recent labs 05/22/2023.        Assessment & Plan:  Well woman exam Continue regular activity.  Discussed healthy diet. Continue follow-up with oncology for breast cancer screening. Return in about 1 year (around 06/06/2024) for physical. Otherwise routine follow-up with Dr. Adriana Simas as planned.

## 2023-06-08 ENCOUNTER — Encounter: Payer: Self-pay | Admitting: Nurse Practitioner

## 2023-06-12 ENCOUNTER — Encounter: Payer: Commercial Managed Care - PPO | Attending: Family Medicine | Admitting: Dietician

## 2023-06-12 ENCOUNTER — Encounter: Payer: Self-pay | Admitting: Dietician

## 2023-06-12 DIAGNOSIS — R638 Other symptoms and signs concerning food and fluid intake: Secondary | ICD-10-CM

## 2023-06-12 DIAGNOSIS — G5732 Lesion of lateral popliteal nerve, left lower limb: Secondary | ICD-10-CM | POA: Diagnosis not present

## 2023-06-12 DIAGNOSIS — G6289 Other specified polyneuropathies: Secondary | ICD-10-CM | POA: Diagnosis not present

## 2023-06-12 DIAGNOSIS — G5731 Lesion of lateral popliteal nerve, right lower limb: Secondary | ICD-10-CM | POA: Diagnosis not present

## 2023-06-12 NOTE — Patient Instructions (Addendum)
Look for "Burp-Less" Omega 3 supplement!  Look into Quest brand protein bars for a good snack/meal replacement. Look for no more than a ~2:1 ratio of carbs to protein on the nutrition label when choosing protein bars.  Work towards eating three meals a day, about 5-6 hours apart!  Begin to recognize carbohydrates, proteins, and non-starchy vegetables in your food choices!  Begin to build your meals using the proportions of the Balanced Plate. First, select your carb choice(s) for the meal. Make this 25% of your meal. Next, select your source of protein to pair with your carb choice(s). Make this another 25% of your meal. Finally, complete your meal with a variety of non-starchy vegetables. Make this the remaining 50% of your meal.

## 2023-06-12 NOTE — Progress Notes (Signed)
Medical Nutrition Therapy  Appointment Start time:  [id number]  Appointment End time:  0945  [place] [employer] Wellness Visit 1 of 3 EID#: [id number]  Primary concerns today: A1c and triglycerides  Referral diagnosis: R63.8 - Alteration in Nutrition Preferred learning style: No preference indicated Learning readiness: Change in progress   NUTRITION ASSESSMENT   Anthropometrics  None taken    Clinical Medical Hx: Cancer, Prediabetes Medications: Ondansetron, Exemestane, Rizatriptan, Trazodone Labs (05/22/2023): A1c - 6.0%, TGL - 282 mg/dL Notable Signs/Symptoms: None   Lifestyle & Dietary Hx Pt reports history of breast cancer, been through chemo and radiation, finished in March, has prior nutrition education related to cancer. Pt reports surprise that their A1c did not increase as a result of cancer treatment. Pt reports having high triglycerides, started taking O3s recently at recommendation of doctor. Pt reports dairy gives GI upset/bloating, but probiotic helping with that. Pt reports difficulty achieving consistent balance with diet in general.    Estimated daily fluid intake: 64 oz Supplements: O3, Calcium, Vit D, Probiotic Sleep: 6-7 hrs, uninterrupted Stress / self-care: Moderate, Current average weekly physical activity: Walks, does repetitive upper body movements throughout the day for exercises.  24-Hr Dietary Recall First Meal: 2 scrambled eggs, grits, black coffee Snack: none Second Meal: Grilled chicken, salad Snack: potato chips Third Meal: none Snack: carrots, celery Beverages: water, coffee, unsweet tea   NUTRITION DIAGNOSIS  NB-1.1 Food and nutrition-related knowledge deficit As related to Prediabetes, Hypertriglyceridemia.  As evidenced by A1c of 6.0%, TGL of 282 mg/dL.   NUTRITION INTERVENTION  Nutrition education (E-1) on the following topics:  Educated patient on the balanced plate eating model. Recommended lunch and dinner be 1/2 non-starchy  vegetables, 1/4 starches, and 1/4 protein. Recommended breakfast be a balance of starch and protein with a piece of fruit. Discussed with patient the importance of working towards hitting the proportions of the balanced plate consistently. Educated patient on the pathophysiology of diabetes. This includes why our bodies need circulating blood sugar, the relationship between insulin and blood sugar, and the results of insulin resistance and/or pancreatic insufficiency on the development of diabetes. Educated patient on factors that contribute to elevation of blood sugars, such as stress, illness, injury,and food choices. Discussed the role that physical activity plays in lowering blood sugar. Educate patient on the three main macronutrients. Protein, fats, and carbohydrates. Discussed how each of these macronutrients affect blood sugar levels, especially carbohydrate, and the importance of eating a consistent amount of carbohydrate throughout the day.   Handouts Provided Include  Balanced Plate  Balanced Plate Food List  Learning Style & Readiness for Change Teaching method utilized: Visual & Auditory  Demonstrated degree of understanding via: Teach Back  Barriers to learning/adherence to lifestyle change: None  Goals Established by Pt Look for "Burp-Less" Omega 3 supplement Look into Quest brand protein bars for a good snack/meal replacement. Look for no more than a ~2:1 ratio of carbs to protein on the nutrition label when choosing protein bars. Work towards eating three meals a day, about 5-6 hours apart! Begin to recognize carbohydrates, proteins, and non-starchy vegetables in your food choices! Begin to build your meals using the proportions of the Balanced Plate. First, select your carb choice(s) for the meal. Make this 25% of your meal. Next, select your source of protein to pair with your carb choice(s). Make this another 25% of your meal. Finally, complete your meal with a variety of  non-starchy vegetables. Make this the remaining 50% of your meal.  MONITORING & EVALUATION Dietary intake, weekly physical activity, and meal consistency in 6 weeks.  Next Steps  Patient is to follow up for remaining 2 visits.

## 2023-06-13 ENCOUNTER — Other Ambulatory Visit: Payer: Self-pay | Admitting: Nurse Practitioner

## 2023-06-13 ENCOUNTER — Other Ambulatory Visit: Payer: Self-pay

## 2023-06-15 ENCOUNTER — Other Ambulatory Visit: Payer: Self-pay

## 2023-06-19 ENCOUNTER — Other Ambulatory Visit: Payer: Self-pay

## 2023-06-19 ENCOUNTER — Encounter: Payer: Self-pay | Admitting: Oncology

## 2023-06-23 ENCOUNTER — Inpatient Hospital Stay: Payer: Commercial Managed Care - PPO | Attending: Oncology

## 2023-06-23 ENCOUNTER — Encounter: Payer: Self-pay | Admitting: Oncology

## 2023-06-23 ENCOUNTER — Inpatient Hospital Stay (HOSPITAL_BASED_OUTPATIENT_CLINIC_OR_DEPARTMENT_OTHER): Payer: Commercial Managed Care - PPO | Admitting: Oncology

## 2023-06-23 ENCOUNTER — Inpatient Hospital Stay: Payer: Commercial Managed Care - PPO

## 2023-06-23 VITALS — BP 109/73 | HR 68 | Temp 96.3°F | Resp 17 | Wt 163.0 lb

## 2023-06-23 DIAGNOSIS — Z170 Estrogen receptor positive status [ER+]: Secondary | ICD-10-CM | POA: Diagnosis not present

## 2023-06-23 DIAGNOSIS — Z853 Personal history of malignant neoplasm of breast: Secondary | ICD-10-CM

## 2023-06-23 DIAGNOSIS — Z08 Encounter for follow-up examination after completed treatment for malignant neoplasm: Secondary | ICD-10-CM

## 2023-06-23 DIAGNOSIS — C50912 Malignant neoplasm of unspecified site of left female breast: Secondary | ICD-10-CM | POA: Diagnosis not present

## 2023-06-23 DIAGNOSIS — Z90710 Acquired absence of both cervix and uterus: Secondary | ICD-10-CM | POA: Diagnosis not present

## 2023-06-23 DIAGNOSIS — Z79811 Long term (current) use of aromatase inhibitors: Secondary | ICD-10-CM | POA: Diagnosis not present

## 2023-06-23 DIAGNOSIS — Z803 Family history of malignant neoplasm of breast: Secondary | ICD-10-CM | POA: Insufficient documentation

## 2023-06-23 DIAGNOSIS — Z7983 Long term (current) use of bisphosphonates: Secondary | ICD-10-CM | POA: Diagnosis not present

## 2023-06-23 DIAGNOSIS — Z5181 Encounter for therapeutic drug level monitoring: Secondary | ICD-10-CM | POA: Diagnosis not present

## 2023-06-23 DIAGNOSIS — C50412 Malignant neoplasm of upper-outer quadrant of left female breast: Secondary | ICD-10-CM

## 2023-06-23 DIAGNOSIS — Z800 Family history of malignant neoplasm of digestive organs: Secondary | ICD-10-CM | POA: Insufficient documentation

## 2023-06-23 LAB — COMPREHENSIVE METABOLIC PANEL
ALT: 24 U/L (ref 0–44)
AST: 20 U/L (ref 15–41)
Albumin: 4.4 g/dL (ref 3.5–5.0)
Alkaline Phosphatase: 52 U/L (ref 38–126)
Anion gap: 9 (ref 5–15)
BUN: 18 mg/dL (ref 6–20)
CO2: 25 mmol/L (ref 22–32)
Calcium: 9.9 mg/dL (ref 8.9–10.3)
Chloride: 105 mmol/L (ref 98–111)
Creatinine, Ser: 0.64 mg/dL (ref 0.44–1.00)
GFR, Estimated: >60 mL/min (ref >60)
Glucose, Bld: 104 mg/dL — ABNORMAL HIGH (ref 70–99)
Potassium: 3.7 mmol/L (ref 3.5–5.1)
Sodium: 139 mmol/L (ref 135–145)
Total Bilirubin: 0.5 mg/dL (ref 0.3–1.2)
Total Protein: 7.3 g/dL (ref 6.5–8.1)

## 2023-06-23 LAB — CBC (CANCER CENTER ONLY)
HCT: 40.7 % (ref 36.0–46.0)
Hemoglobin: 13.8 g/dL (ref 12.0–15.0)
MCH: 29.4 pg (ref 26.0–34.0)
MCHC: 33.9 g/dL (ref 30.0–36.0)
MCV: 86.6 fL (ref 80.0–100.0)
Platelet Count: 215 10*3/uL (ref 150–400)
RBC: 4.7 MIL/uL (ref 3.87–5.11)
RDW: 14.8 % (ref 11.5–15.5)
WBC Count: 7.1 10*3/uL (ref 4.0–10.5)
nRBC: 0 % (ref 0.0–0.2)

## 2023-06-23 MED ORDER — ZOLEDRONIC ACID 4 MG/100ML IV SOLN
4.0000 mg | INTRAVENOUS | Status: DC
  Administered 2023-06-23: 4 mg via INTRAVENOUS
  Filled 2023-06-23: qty 100

## 2023-06-23 MED ORDER — SODIUM CHLORIDE 0.9 % IV SOLN
Freq: Once | INTRAVENOUS | Status: AC
  Filled 2023-06-23: qty 250

## 2023-06-23 NOTE — Patient Instructions (Signed)

## 2023-06-24 ENCOUNTER — Encounter: Payer: Self-pay | Admitting: Oncology

## 2023-06-24 NOTE — Progress Notes (Signed)
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  This was followed by a diagnostic mammogram and ultrasound which showed multiple benign cysts noted at the 1 o'clock position.  At the 2 o'clock position 3 cm from the nipple was a oval hypoechoic mass measuring 5 x 4 x 3 mm.  No suspicious left axillary adenopathy.  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   Final pathology showed grade two 7 mm tumor with negative margins.  1 sentinel lymph node negative for malignancy.  Oncotype results showed a recurrence score of 28 and absolute benefit of chemotherapy at greater than 15%.   Patient completed 4 cycles of adjuvant TC chemotherapy in February 2024.  She also completed adjuvant radiation and started taking Arimidex in March 2024.  She is also on adjuvant Zometa.  This was later switched to exemestane    Interval history-patient is currently on exemestane and  has been experiencing difficulty sleeping as well as pain in her bilateral forearms.  She is not sure of the previous side effects that she experienced on Arimidex was due to Arimidex versus Zometa  ECOG PS- 0 Pain scale- 0   Review of systems- Review of Systems  Constitutional:  Negative for chills, fever, malaise/fatigue and weight loss.  HENT:  Negative for congestion, ear discharge and nosebleeds.   Eyes:  Negative for blurred vision.  Respiratory:  Negative for cough, hemoptysis, sputum production, shortness of breath and wheezing.   Cardiovascular:  Negative for chest pain, palpitations, orthopnea and claudication.  Gastrointestinal:  Negative for abdominal pain, blood in stool, constipation, diarrhea, heartburn, melena, nausea and vomiting.  Genitourinary:  Negative for dysuria, flank pain, frequency, hematuria and urgency.  Musculoskeletal:  Positive for joint pain. Negative for back pain and myalgias.  Skin:  Negative for rash.  Neurological:  Negative for dizziness, tingling, focal weakness, seizures, weakness and headaches.  Endo/Heme/Allergies:  Does not bruise/bleed easily.  Psychiatric/Behavioral:  Negative for depression and suicidal ideas. The patient does not have insomnia.       Allergies  Allergen Reactions   Cymbalta [Duloxetine Hcl] Other (See Comments)    Hallucinations; sleep walking   Nortriptyline Nausea And Vomiting   Phenergan [Promethazine Hcl]     Restless legs and cramping     Past Medical History:  Diagnosis Date   Anxiety    Bronchitis    Cancer (HCC) 09/2022   left breast IMC   Complication of anesthesia    PONV   Contusion of leg 10/06/2015   Depression  GERD (gastroesophageal reflux disease)    Insomnia    Migraine headache    Pneumonia    2000   PONV (postoperative nausea and vomiting)      Past Surgical History:  Procedure Laterality Date   ABDOMINAL HYSTERECTOMY     BALLOON DILATION N/A 12/14/2020   Procedure: BALLOON  DILATION;  Surgeon: Lanelle Bal, DO;  [facility]: [facility];  Service: Endoscopy;  Laterality: N/A;   BIOPSY  12/14/2020   Procedure: BIOPSY;  Surgeon: Lanelle Bal, DO;  [facility]: [facility];  Service: Endoscopy;;   BREAST BIOPSY Left 08/24/2022   [place] bx, coil marker path pending   BREAST LUMPECTOMY WITH RADIOACTIVE SEED AND SENTINEL LYMPH NODE BIOPSY Left 09/29/2022   Procedure: LEFT BREAST LUMPECTOMY WITH RADIOACTIVE SEED AND AXILLARY SENTINEL LYMPH NODE BIOPSY;  Surgeon: Emelia Loron, MD;  Location: [facility];  Service: General;  Laterality: Left;   CESAREAN SECTION     CHOLECYSTECTOMY  1988   COLONOSCOPY N/A 12/10/2018    six 4-8 mm polyps in proximal descending colon, mid transverse and hepatic flexure. Diverticulosis. External and internal hemorrhoids. Torturous colon. Two simple adenomas, two serrated polyps, 2 hyperplastic polyps. Surveillance due Dec 2022.    ESOPHAGOGASTRODUODENOSCOPY (EGD) WITH PROPOFOL N/A 12/14/2020   Benign-appearing esophageal stenosis s/p dilation, gastritis, normal duodenum. Mild chronic gastritis.    open heart surgery  1967   PATENT DUCTUS ARTERIOUS REPAIR  1967   POLYPECTOMY  12/10/2018   Procedure: POLYPECTOMY;  Surgeon: West Bali, MD;  Location: [facility];  Service: Endoscopy;;  hepatic flexure, descending, transverse   PORT-A-CATH REMOVAL Right 03/14/2023   Procedure: REMOVAL PORT-A-CATH;  Surgeon: Emelia Loron, MD;  Location: [facility];  Service: General;  Laterality: Right;   PORTACATH PLACEMENT N/A 11/14/2022   Procedure: PORT PLACEMENT WITH ULTRASOUND GUIDANCE;  Surgeon: Emelia Loron, MD;  Location: [facility];  Service: General;  Laterality: N/A;   SHOULDER ARTHROSCOPY Left 06/16/2017   Procedure: LEFT SHOULDER ARTHROSCOPY, DEBRIDEMENT, AND DECOMPRESSION;  Surgeon: Nadara Mustard, MD;  Location: [facility];  Service: Orthopedics;  Laterality: Left;   SHOULDER  ARTHROSCOPY WITH SUBACROMIAL DECOMPRESSION Left 10/03/2019   Procedure: SHOULDER ARTHROSCOPY WITH DEBRIDEMENT, DECOMPRESSION;  Surgeon: Christena Flake, MD;  Location: [facility];  Service: Orthopedics;  Laterality: Left;   SHOULDER SURGERY Left 2008   [facility] joint    Social History   Socioeconomic History   Marital status: Married    Spouse name: daniel   Number of children: 4   Years of education: Not on file   Highest education level: Some college, no degree  Occupational History   Not on file  Tobacco Use   Smoking status: Never   Smokeless tobacco: Never  Vaping Use   Vaping status: Never Used  Substance and Sexual Activity   Alcohol use: Not Currently    Comment: occassionally-not since chemo started   Drug use: No   Sexual activity: Yes    Birth control/protection: Surgical    Comment: hyst  Other Topics Concern   Not on file  Social History Narrative   Not on file   Social Determinants of Health   Financial Resource Strain: Low Risk  (05/15/2023)   Overall Financial Resource Strain (CARDIA)    Difficulty of Paying Living Expenses: Not hard at all  Food Insecurity: No Food Insecurity (05/15/2023)   Hunger Vital Sign    Worried About Running Out of Food in the Last Year:  Never true    Ran Out of Food in the Last Year: Never true  Transportation Needs: No Transportation Needs (05/15/2023)   PRAPARE - Administrator, Civil Service (Medical): No    Lack of Transportation (Non-Medical): No  Physical Activity: Sufficiently Active (05/15/2023)   Exercise Vital Sign    Days of Exercise per Week: 5 days    Minutes of Exercise per Session: 30 min  Stress: Stress Concern Present (05/15/2023)   [employer] of Occupational Health - Occupational Stress Questionnaire    Feeling of Stress : Rather much  Social Connections: Socially Integrated (05/15/2023)   Social Connection and Isolation Panel [NHANES]    Frequency of Communication with Friends and Family: Three times a  week    Frequency of Social Gatherings with Friends and Family: Three times a week    Attends Religious Services: More than 4 times per year    Active Member of Clubs or Organizations: Yes    Attends Banker Meetings: 1 to 4 times per year    Marital Status: Married  Catering manager Violence: Not At Risk (07/12/2022)   Humiliation, Afraid, Rape, and Kick questionnaire    Fear of Current or Ex-Partner: No    Emotionally Abused: No    Physically Abused: No    Sexually Abused: No    Family History  Problem Relation Age of Onset   Breast cancer Mother 53   Hypertension Father    Colon cancer Paternal Uncle        dx 40s   Hypertension Maternal Grandmother    Diabetes Maternal Grandmother    Heart disease Maternal Grandmother 40       MI   Hypertension Maternal Grandfather    Throat cancer Maternal Grandfather 37     Current Outpatient Medications:    acetaminophen (TYLENOL) 500 MG tablet, Take 500 mg by mouth every 6 (six) hours as needed for moderate pain., Disp: , Rfl:    CALCIUM PO, Take 1 capsule by mouth daily., Disp: , Rfl:    exemestane (AROMASIN) 25 MG tablet, Take 1 tablet (25 mg total) by mouth daily after breakfast., Disp: 30 tablet, Rfl: 3   Lifitegrast (XIIDRA) 5 % SOLN, Instill 1 drop into both eyes twice a day, Disp: 180 each, Rfl: 4   omega-3 acid ethyl esters (LOVAZA) 1 g capsule, Take by mouth 2 (two) times daily., Disp: , Rfl:    ondansetron (ZOFRAN-ODT) 4 MG disintegrating tablet, Take 1 tablet (4 mg total) by mouth every 8 (eight) hours as needed for Nausea migraine, Disp: 20 tablet, Rfl: 11   pregabalin (LYRICA) 25 MG capsule, Take 1 capsule (25 mg total) by mouth 2 (two) times daily., Disp: 60 capsule, Rfl: 11   Probiotic Product (PROBIOTIC PO), Take 1 capsule by mouth daily., Disp: , Rfl:    rizatriptan (MAXALT-MLT) 10 MG disintegrating tablet, Take 1 tablet (10 mg total) by mouth daily as needed (may repeat dose once in > 2 hrs) for up to 1 dose,  Disp: 10 tablet, Rfl: 11   traZODone (DESYREL) 50 MG tablet, Take 1 tablet (50 mg total) by mouth at bedtime as needed for sleep., Disp: 90 tablet, Rfl: 1   valACYclovir (VALTREX) 500 MG tablet, Take 2 tablets daily (1000 mg) by mouth daily for 5 days for acute outbreak. Then take 1 tablet (500 mg) by mouth daily for suppression. Continue during cancer treatments, Disp: 90 tablet, Rfl: 1   famotidine (PEPCID) 20 MG tablet,  Take 20 mg by mouth 2 (two) times daily. (Patient not taking: Reported on 06/12/2023), Disp: , Rfl:    pantoprazole (PROTONIX) 20 MG tablet, Take 1 tablet (20 mg total) by mouth daily. (Patient not taking: Reported on 06/12/2023), Disp: 30 tablet, Rfl: 3   tiZANidine (ZANAFLEX) 4 MG capsule, Take 1 capsule (4 mg total) by mouth 3 (three) times daily. (Patient not taking: Reported on 06/12/2023), Disp: 90 capsule, Rfl: 3   topiramate (TOPAMAX) 25 MG tablet, Take 1 tablet (25 mg total) by mouth 2 (two) times daily (Patient not taking: Reported on 06/12/2023), Disp: 60 tablet, Rfl: 11  Physical exam:  Vitals:   06/23/23 1310  BP: 109/73  Pulse: 68  Resp: 17  Temp: (!) 96.3 F (35.7 C)  SpO2: 99%  Weight: 163 lb (73.9 kg)   Physical Exam Cardiovascular:     Rate and Rhythm: Normal rate and regular rhythm.     Heart sounds: Normal heart sounds.  Pulmonary:     Effort: Pulmonary effort is normal.     Breath sounds: Normal breath sounds.  Abdominal:     General: Bowel sounds are normal.     Palpations: Abdomen is soft.  Skin:    General: Skin is warm and dry.  Neurological:     Mental Status: She is alert and oriented to person, place, and time.    Breast exam was performed in seated and lying down position. Patient is status post left lumpectomy with a well-healed surgical scar. No evidence of any palpable masses. No evidence of axillary adenopathy. No evidence of any palpable masses or lumps in the right breast. No evidence of right axillary adenopathy      Latest Ref Rng  & Units 06/23/2023   12:42 PM  CMP  Glucose 70 - 99 mg/dL 161   BUN 6 - 20 mg/dL 18   Creatinine 0.96 - 1.00 mg/dL 0.45   Sodium 409 - 811 mmol/L 139   Potassium 3.5 - 5.1 mmol/L 3.7   Chloride 98 - 111 mmol/L 105   CO2 22 - 32 mmol/L 25   Calcium 8.9 - 10.3 mg/dL 9.9   Total Protein 6.5 - 8.1 g/dL 7.3   Total Bilirubin 0.3 - 1.2 mg/dL 0.5   Alkaline Phos 38 - 126 U/L 52   AST 15 - 41 U/L 20   ALT 0 - 44 U/L 24       Latest Ref Rng & Units 06/23/2023   12:42 PM  CBC  WBC 4.0 - 10.5 K/uL 7.1   Hemoglobin 12.0 - 15.0 g/dL 91.4   Hematocrit 78.2 - 46.0 % 40.7   Platelets 150 - 400 K/uL 215      Assessment and plan- Patient is a 57 y.o. female with a history of stage I left breast cancer s/p surgery adjuvant chemotherapy and radiation and currently on exemestane here for routine follow-up and to receive Zometa  Patient has been on Zometa every 3 months and will be getting it for total of 2 years.  Calcium levels acceptable to proceed with Zometa.  She did get significant body aches which lasted for about 24 to 48 hours previously and she knows what to expect this time as well.  Patient is unsure of her side effects that she experienced while she was on Arimidex but truly from Arimidex or Zometa.  She would like to switch back from exemestane to Arimidex in 3 to 4 weeks time which I think would be reasonable.  I will see her back in 3 months with labs for next dose of Zometa   Visit Diagnosis 1. Encounter for follow-up surveillance of breast cancer   2. Use of exemestane (Aromasin)   3. Encounter for monitoring zoledronic acid therapy      Dr. Owens Shark, MD, MPH [facility] at [facility] [phone] 06/24/2023 12:59 PM

## 2023-06-29 ENCOUNTER — Ambulatory Visit (INDEPENDENT_AMBULATORY_CARE_PROVIDER_SITE_OTHER): Payer: Commercial Managed Care - PPO | Admitting: Licensed Clinical Social Worker

## 2023-06-29 DIAGNOSIS — F4310 Post-traumatic stress disorder, unspecified: Secondary | ICD-10-CM | POA: Diagnosis not present

## 2023-06-29 NOTE — Patient Instructions (Signed)
 Outpatient Psychiatry and Counseling  FOR CRISIS:  call 911, [facility]: Mobile Crisis Management 24 hours:  [phone], call [phone], [facility] ([facility]) [address] walk in, or go to your local EMERGENCY DEPARTMENT  [facility] [address], [place], [place] [address]  [phone]  [facility] [address] [place], [place] [address] [phone]  [facility] [address] [place],  [place]  [address] [phone]  [facility] Address: [address] Maurine Cane [place], [place] [address] Phone: [phone]  [facility] [place] [facility] [place] Locations) [email or web address]  [employer] [facility] [employer] fee and walk in schedule: M-F 8am-12pm/1pm-3pm [address]  [place], [place] [address] [phone]  [facility] [address] [place], [place] [address] [phone]  [facility] Outpatient Services/ Intensive Outpatient Therapy Program/CDIOP/PHP [address] [place], [place] [address] [phone]  [facility] Outpatient Therapy Services, [place]      [phone]     [address]    [place], [place] [address]                 [facility]   [facility] [phone]. [address] [place], [place] [address]  [facility]          [address]  [place], [place] [address]       [phone]  [facility] [address] [place], [place] [address] [phone]  [facility]    [address]    [place], [place] [address]     [phone]       [facility] [address] [place] [address]  [facility]     [address] [place], [place]      [phone]       [facility] Eulogio Ditch, MD [address] [place], [place] [address] [phone]  [facility]     [address]     [place], [place] [address]     [phone]       [facility] [address] [place], [place] [address] [phone] Resources for Temporary Residential Assistance/Crisis Centers

## 2023-06-30 NOTE — Progress Notes (Signed)
   THERAPIST PROGRESS NOTE  Session Time: 4-5p  Behavioral Health Outpatient [facility] in office visit for patient and LCSW clinician  Participation Level: Active  Behavioral Response: Neat and Well GroomedAlertAnxious and Euthymic  Type of Therapy: Individual Therapy  Treatment Goals addressed: Reduce frequency, intensity, and duration of PTSD symptoms so daily functioning is improved: Input needed on appropriate metric. Pt self report.    ProgressTowards Goals: Progressing  Interventions: CBT and Other: trauma focused  Summary: Nichole Brown is a 57 y.o. female who presents with continuing symptoms related to PTSD/anxiety.   Clinician assisted pt with identifying situations/scenarios/schemas triggering anxiety and/or depression symptoms. Allowed pt to explore and express thoughts and feelings and discussed current coping mechanisms. Reviewed any recent  changes/recommendations.   Pt denies any significant episodes of depression or any anger episodes. Pt feels that they are managing situational stressors well. Pt reports that sleep quality and quantity is good and pt reporting normal appetite. Pt feels that they are doing a good job of using coping skills in the moment. Pt does not have any other questions or concerns to discuss at today's session.   Continued recommendations are as follows: self care behaviors, positive social engagements, focusing on overall work/home/life balance, and focusing on positive physical and emotional wellness.   Suicidal/Homicidal: No  Therapist Response: Pt is continuing to apply interventions learned in session into daily life situations. Pt is currently on track to meet goals utilizing interventions mentioned above. Personal growth and progress noted. Treatment to continue as indicated.   Pt presents with continuing positive affect and attitude towards overall health related concerns.  Plan: Informed patient that clinician will be leaving  outpatient department. Allowed pt to explore any questions or concerns and discussed future counseling options/resources. Provided pt with psychoeducational resources and list of OPT therapists. Encouraged pt to continue with psychiatric med management appointments, if applicable.    Diagnosis:  Encounter Diagnosis  Name Primary?   PTSD (post-traumatic stress disorder) Yes     Collaboration of Care: Other Pt encouraged to continue psychiatric care with Dr. Vanetta Shawl  Patient/Guardian was advised Release of Information must be obtained prior to any record release in order to collaborate their care with an outside provider. Patient/Guardian was advised if they have not already done so to contact the registration department to sign all necessary forms in order for [place] to release information regarding their care.   Consent: Patient/Guardian gives verbal consent for treatment and assignment of benefits for services provided during this visit. Patient/Guardian expressed understanding and agreed to proceed.   Ernest Haber Lahoma Constantin, LCSW 06/30/2023

## 2023-07-03 ENCOUNTER — Ambulatory Visit: Payer: Commercial Managed Care - PPO | Attending: General Surgery | Admitting: Rehabilitation

## 2023-07-03 ENCOUNTER — Encounter: Payer: Self-pay | Admitting: Oncology

## 2023-07-03 ENCOUNTER — Other Ambulatory Visit: Payer: Self-pay

## 2023-07-03 DIAGNOSIS — H52223 Regular astigmatism, bilateral: Secondary | ICD-10-CM | POA: Diagnosis not present

## 2023-07-03 DIAGNOSIS — H524 Presbyopia: Secondary | ICD-10-CM | POA: Diagnosis not present

## 2023-07-03 DIAGNOSIS — Z170 Estrogen receptor positive status [ER+]: Secondary | ICD-10-CM | POA: Insufficient documentation

## 2023-07-03 DIAGNOSIS — C50412 Malignant neoplasm of upper-outer quadrant of left female breast: Secondary | ICD-10-CM | POA: Insufficient documentation

## 2023-07-03 DIAGNOSIS — Z483 Aftercare following surgery for neoplasm: Secondary | ICD-10-CM | POA: Insufficient documentation

## 2023-07-03 DIAGNOSIS — H5213 Myopia, bilateral: Secondary | ICD-10-CM | POA: Diagnosis not present

## 2023-07-03 MED ORDER — TYRVAYA 0.03 MG/ACT NA SOLN
NASAL | 6 refills | Status: DC
  Filled 2023-07-03 – 2023-08-06 (×2): qty 4.2, 30d supply, fill #0
  Filled 2023-08-09: qty 4.2, 15d supply, fill #0

## 2023-07-03 NOTE — Therapy (Signed)
OUTPATIENT PHYSICAL THERAPY SOZO SCREENING NOTE   Patient Name: Nichole Brown MRN: [id number] DOB:17-Mar-1966, 57 y.o., female Today's Date: 07/03/2023  PCP: [name], DO REFERRING PROVIDER: Emelia Loron, MD   PT End of Session - 07/03/23 0825     Visit Number 6   screen only   PT Start Time 0820    PT Stop Time 0826    PT Time Calculation (min) 6 min    Activity Tolerance Patient tolerated treatment well    Behavior During Therapy [facility] for tasks assessed/performed             Past Medical History:  Diagnosis Date   Anxiety    Bronchitis    Cancer (HCC) 09/2022   left breast IMC   Complication of anesthesia    PONV   Contusion of leg 10/06/2015   Depression    GERD (gastroesophageal reflux disease)    Insomnia    Migraine headache    Pneumonia    2000   PONV (postoperative nausea and vomiting)    Past Surgical History:  Procedure Laterality Date   ABDOMINAL HYSTERECTOMY     BALLOON DILATION N/A 12/14/2020   Procedure: BALLOON DILATION;  Surgeon: Lanelle Bal, DO;  [facility];  Service: Endoscopy;  Laterality: N/A;   BIOPSY  12/14/2020   Procedure: BIOPSY;  Surgeon: Lanelle Bal, DO;  [facility];  Service: Endoscopy;;   BREAST BIOPSY Left 08/24/2022   [place] bx, coil marker path pending   BREAST LUMPECTOMY WITH RADIOACTIVE SEED AND SENTINEL LYMPH NODE BIOPSY Left 09/29/2022   Procedure: LEFT BREAST LUMPECTOMY WITH RADIOACTIVE SEED AND AXILLARY SENTINEL LYMPH NODE BIOPSY;  Surgeon: Emelia Loron, MD;  Location: [facility];  Service: General;  Laterality: Left;   CESAREAN SECTION     CHOLECYSTECTOMY  1988   COLONOSCOPY N/A 12/10/2018    six 4-8 mm polyps in proximal descending colon, mid transverse and hepatic flexure. Diverticulosis. External and internal hemorrhoids. Torturous colon. Two simple adenomas, two serrated polyps, 2 hyperplastic polyps. Surveillance due Dec 2022.     ESOPHAGOGASTRODUODENOSCOPY (EGD) WITH PROPOFOL N/A 12/14/2020   Benign-appearing esophageal stenosis s/p dilation, gastritis, normal duodenum. Mild chronic gastritis.    open heart surgery  1967   PATENT DUCTUS ARTERIOUS REPAIR  1967   POLYPECTOMY  12/10/2018   Procedure: POLYPECTOMY;  Surgeon: West Bali, MD;  [facility];  Service: Endoscopy;;  hepatic flexure, descending, transverse   PORT-A-CATH REMOVAL Right 03/14/2023   Procedure: REMOVAL PORT-A-CATH;  Surgeon: Emelia Loron, MD;  Location: [facility];  Service: General;  Laterality: Right;   PORTACATH PLACEMENT N/A 11/14/2022   Procedure: PORT PLACEMENT WITH ULTRASOUND GUIDANCE;  Surgeon: Emelia Loron, MD;  Location: [facility];  Service: General;  Laterality: N/A;   SHOULDER ARTHROSCOPY Left 06/16/2017   Procedure: LEFT SHOULDER ARTHROSCOPY, DEBRIDEMENT, AND DECOMPRESSION;  Surgeon: Nadara Mustard, MD;  Location: [facility];  Service: Orthopedics;  Laterality: Left;   SHOULDER ARTHROSCOPY WITH SUBACROMIAL DECOMPRESSION Left 10/03/2019   Procedure: SHOULDER ARTHROSCOPY WITH DEBRIDEMENT, DECOMPRESSION;  Surgeon: Christena Flake, MD;  Location: [facility];  Service: Orthopedics;  Laterality: Left;   SHOULDER SURGERY Left 2008   [facility] joint   Patient Active Problem List   Diagnosis Date Noted   Malignant neoplasm of upper-outer quadrant of left breast in female, estrogen receptor positive (HCC) 08/29/2022   Depression, major, single episode, moderate (HCC) 01/01/2019   Generalized anxiety disorder  01/01/2019   Gastroesophageal reflux disease 12/26/2016   Migraine headache 07/24/2013   Insomnia 05/30/2013    REFERRING DIAG: left breast cancer at risk for lymphedema  THERAPY DIAG:  Aftercare following surgery for neoplasm  Malignant neoplasm of upper-outer quadrant of left breast in female, estrogen receptor positive (HCC)  PERTINENT HISTORY: This mass was biopsied and was consistent with  invasive mammary carcinoma grade 1 ER positive greater than 90%, PR +31 to 40% and HER2 negative. Hx of 3 shoulder surgeries on the left prior to surgery.   Had a left lumpectomy and SLNB on 09/29/22.  1 negative lymph node removed   PRECAUTIONS: left UE Lymphedema risk, None  SUBJECTIVE: Pt returns for her 3 month L-Dex screen.   PAIN:  Are you having pain? No  SOZO SCREENING: Patient was assessed today using the SOZO machine to determine the lymphedema index score. This was compared to her baseline score. It was determined that she is within the recommended range when compared to her baseline and no further action is needed at this time. She will continue SOZO screenings. These are done every 3 months for 2 years post operatively followed by every 6 months for 2 years, and then annually.   L-DEX FLOWSHEETS - 07/03/23 0800       L-DEX LYMPHEDEMA SCREENING   Measurement Type Unilateral    L-DEX MEASUREMENT EXTREMITY Upper Extremity    POSITION  Standing    DOMINANT SIDE Right    At Risk Side Left    BASELINE SCORE (UNILATERAL) -3.9    L-DEX SCORE (UNILATERAL) -6.9    VALUE CHANGE (UNILAT) -3               Carleah Yablonski R, PT 07/03/2023, 8:26 AM

## 2023-07-13 ENCOUNTER — Ambulatory Visit
Admission: RE | Admit: 2023-07-13 | Discharge: 2023-07-13 | Disposition: A | Discharge status: Home / Self Care | Payer: Commercial Managed Care - PPO | Source: Ambulatory Visit | Attending: Oncology | Admitting: Oncology

## 2023-07-13 DIAGNOSIS — Z08 Encounter for follow-up examination after completed treatment for malignant neoplasm: Secondary | ICD-10-CM | POA: Insufficient documentation

## 2023-07-13 DIAGNOSIS — M79605 Pain in left leg: Secondary | ICD-10-CM | POA: Diagnosis not present

## 2023-07-13 DIAGNOSIS — G43709 Chronic migraine without aura, not intractable, without status migrainosus: Secondary | ICD-10-CM | POA: Diagnosis not present

## 2023-07-13 DIAGNOSIS — T451X5A Adverse effect of antineoplastic and immunosuppressive drugs, initial encounter: Secondary | ICD-10-CM | POA: Diagnosis not present

## 2023-07-13 DIAGNOSIS — R92333 Mammographic heterogeneous density, bilateral breasts: Secondary | ICD-10-CM | POA: Diagnosis not present

## 2023-07-13 DIAGNOSIS — G629 Polyneuropathy, unspecified: Secondary | ICD-10-CM | POA: Diagnosis not present

## 2023-07-13 DIAGNOSIS — Z853 Personal history of malignant neoplasm of breast: Secondary | ICD-10-CM | POA: Insufficient documentation

## 2023-07-13 DIAGNOSIS — M79604 Pain in right leg: Secondary | ICD-10-CM | POA: Diagnosis not present

## 2023-07-13 DIAGNOSIS — G620 Drug-induced polyneuropathy: Secondary | ICD-10-CM | POA: Diagnosis not present

## 2023-07-19 ENCOUNTER — Ambulatory Visit (HOSPITAL_COMMUNITY): Payer: Commercial Managed Care - PPO | Admitting: Licensed Clinical Social Worker

## 2023-07-20 ENCOUNTER — Other Ambulatory Visit: Payer: Self-pay

## 2023-07-21 ENCOUNTER — Ambulatory Visit: Payer: Commercial Managed Care - PPO | Admitting: Family Medicine

## 2023-07-24 ENCOUNTER — Ambulatory Visit: Payer: Commercial Managed Care - PPO | Admitting: Dietician

## 2023-07-25 ENCOUNTER — Other Ambulatory Visit: Payer: Self-pay | Admitting: Nurse Practitioner

## 2023-07-25 ENCOUNTER — Other Ambulatory Visit: Payer: Self-pay | Admitting: Oncology

## 2023-07-25 ENCOUNTER — Other Ambulatory Visit: Payer: Self-pay

## 2023-07-25 ENCOUNTER — Encounter: Payer: Self-pay | Admitting: Oncology

## 2023-07-25 MED ORDER — ANASTROZOLE 1 MG PO TABS
1.0000 mg | ORAL_TABLET | Freq: Every day | ORAL | 3 refills | Status: DC
  Filled 2023-07-25: qty 30, 30d supply, fill #0

## 2023-07-26 ENCOUNTER — Other Ambulatory Visit: Payer: Self-pay | Admitting: *Deleted

## 2023-07-26 ENCOUNTER — Other Ambulatory Visit: Payer: Self-pay

## 2023-07-26 MED ORDER — ANASTROZOLE 1 MG PO TABS
1.0000 mg | ORAL_TABLET | Freq: Every day | ORAL | 5 refills | Status: DC
  Filled 2023-07-26 – 2023-08-25 (×2): qty 30, 30d supply, fill #0
  Filled 2023-09-22: qty 30, 30d supply, fill #1
  Filled 2023-10-23: qty 30, 30d supply, fill #2

## 2023-07-27 ENCOUNTER — Other Ambulatory Visit: Payer: Self-pay

## 2023-07-27 ENCOUNTER — Encounter: Payer: Self-pay | Admitting: *Deleted

## 2023-07-27 ENCOUNTER — Other Ambulatory Visit: Payer: Self-pay | Admitting: Nurse Practitioner

## 2023-07-27 MED ORDER — VALACYCLOVIR HCL 500 MG PO TABS
500.0000 mg | ORAL_TABLET | Freq: Every day | ORAL | 1 refills | Status: DC
  Filled 2023-07-27: qty 90, 70d supply, fill #0
  Filled 2023-11-18: qty 90, 70d supply, fill #1

## 2023-07-31 ENCOUNTER — Encounter: Payer: Self-pay | Admitting: Dietician

## 2023-07-31 ENCOUNTER — Encounter: Payer: Commercial Managed Care - PPO | Attending: Family Medicine | Admitting: Dietician

## 2023-07-31 DIAGNOSIS — R638 Other symptoms and signs concerning food and fluid intake: Secondary | ICD-10-CM | POA: Insufficient documentation

## 2023-07-31 NOTE — Progress Notes (Signed)
Medical Nutrition Therapy  Appointment Start time:  47  Appointment End time:  1225  [employer] Wellness Visit 2 of 3 EID#: [id number]  Primary concerns today: A1c and triglycerides  Referral diagnosis: R63.8 - Alteration in Nutrition Preferred learning style: No preference indicated Learning readiness: Change in progress   NUTRITION ASSESSMENT   Anthropometrics  None taken   Clinical Medical Hx: Cancer, Prediabetes Medications: Ondansetron, Exemestane, Rizatriptan, Trazodone Labs (05/22/2023): A1c - 6.0%, TGL - 282 mg/dL, (01/25/864) Glucose - 104 Notable Signs/Symptoms: None  Lifestyle & Dietary Hx Pt reports checking FBG regularly, ranges ~110 - 120. Pt reports trying to eat more greens, build meals based on balanced plate model, limiting carbs by making concession during meals to compensate for desserts. Pt reports lowering consumption of red meats and fried foods, cooking more baked/broiled foods. Pt reports eating more cottage cheese for protein, difficulty finding a protein bar they like. Pt states they have lowered dairy consumption. Pt reports less bloating, states they believe it has improved since eating smaller meals, more balanced meals. Pt reports eating out less on the weekends, cooking at home instead Pt reports feeling comfortable moderating their sweets, states they are able to have a small amount if they feel the urge.   Estimated daily fluid intake: 64 oz Supplements: O3, Calcium, Vit D, Probiotic Sleep: 6-7 hrs, uninterrupted Stress / self-care: Moderate, Current average weekly physical activity: NEW: Exercises ~30 minutes each day, walking   24-Hr Dietary Recall First Meal: oatmeal and an egg Snack: none Second Meal: fried white fish, unsweet tea Snack: Fruits, Green beans Third Meal:  Snack: Popcorn Beverages: water, unsweet tea    NUTRITION DIAGNOSIS  NB-1.1 Food and nutrition-related knowledge deficit As related to Prediabetes,  Hypertriglyceridemia.  As evidenced by A1c of 6.0%, TGL of 282 mg/dL.   NUTRITION INTERVENTION  Nutrition education (E-1) on the following topics:  Educated patient on the balanced plate eating model. Recommended lunch and dinner be 1/2 non-starchy vegetables, 1/4 starches, and 1/4 protein. Recommended breakfast be a balance of starch and protein with a piece of fruit. Discussed with patient the importance of working towards hitting the proportions of the balanced plate consistently. Educated patient on the pathophysiology of diabetes. This includes why our bodies need circulating blood sugar, the relationship between insulin and blood sugar, and the results of insulin resistance and/or pancreatic insufficiency on the development of diabetes. Educated patient on factors that contribute to elevation of blood sugars, such as stress, illness, injury,and food choices. Discussed the role that physical activity plays in lowering blood sugar. Educate patient on the three main macronutrients. Protein, fats, and carbohydrates. Discussed how each of these macronutrients affect blood sugar levels, especially carbohydrate, and the importance of eating a consistent amount of carbohydrate throughout the day.    Handouts Provided Include  Balanced Plate  Balanced Plate Food List   Learning Style & Readiness for Change Teaching method utilized: Visual & Auditory  Demonstrated degree of understanding via: Teach Back  Barriers to learning/adherence to lifestyle change: None   Goals Established by Pt Keep up the great work! Check your blood sugar each morning before eating or drinking (fasting). Look for numbers  under 100 mg/dL Check out http://www.adams.info/ for tons of great recipe ideas.   MONITORING & EVALUATION Dietary intake, weekly physical activity, and meal consistency in 4 weeks.  Next Steps  Patient is to follow up after next labs for remaining visit.

## 2023-07-31 NOTE — Patient Instructions (Addendum)
Keep up the great work!  Check your blood sugar each morning before eating or drinking (fasting). Look for numbers  under 100 mg/dL  Check out http://www.adams.info/ for tons of great recipe ideas.

## 2023-08-03 ENCOUNTER — Encounter: Payer: Self-pay | Admitting: Nurse Practitioner

## 2023-08-03 ENCOUNTER — Ambulatory Visit (INDEPENDENT_AMBULATORY_CARE_PROVIDER_SITE_OTHER): Payer: Commercial Managed Care - PPO | Admitting: Nurse Practitioner

## 2023-08-03 ENCOUNTER — Other Ambulatory Visit: Payer: Self-pay

## 2023-08-03 VITALS — BP 96/54 | HR 91 | Temp 98.1°F | Ht 62.0 in | Wt 164.0 lb

## 2023-08-03 DIAGNOSIS — H16203 Unspecified keratoconjunctivitis, bilateral: Secondary | ICD-10-CM

## 2023-08-03 DIAGNOSIS — M2550 Pain in unspecified joint: Secondary | ICD-10-CM | POA: Diagnosis not present

## 2023-08-03 DIAGNOSIS — R21 Rash and other nonspecific skin eruption: Secondary | ICD-10-CM

## 2023-08-03 DIAGNOSIS — D367 Benign neoplasm of other specified sites: Secondary | ICD-10-CM | POA: Diagnosis not present

## 2023-08-03 DIAGNOSIS — R5383 Other fatigue: Secondary | ICD-10-CM

## 2023-08-03 MED ORDER — BETAMETHASONE DIPROPIONATE AUG 0.05 % EX CREA
TOPICAL_CREAM | Freq: Two times a day (BID) | CUTANEOUS | 0 refills | Status: DC
  Filled 2023-08-03: qty 30, 30d supply, fill #0

## 2023-08-03 NOTE — Progress Notes (Addendum)
Subjective:    Patient ID: Nichole Brown, female    DOB: [birth year], 57 y.o.   MRN: [id number]  HPI Rash on R thumb and groin area Muscle tension all over Complaints of a recurrent rash on very areas of the body.  Will resolve without intervention over time.  Most of the lesions are on her hands.  Has 1 on the right thumb for the last month.  Very dry.  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  Started out as clear fluid-filled bumps, very pruritic.  Very painful when she uses hand sanitizer. Generalized muscle pain.  Also some joint pain.  Specifically in the low back area near the coccyx.  Will also have faint erythema at times.  Patient had a recent eye exam.  Is currently on Xiidra for dry eyes.  Questioning whether she may have some type of inflammatory condition related to all of her issues.  Extreme fatigue.  Note that patient has had breast cancer including treatment.  Currently on anastrozole.  No relief of her pain with Lyrica. Also has a small cyst on the upper inner thigh. Slightly tender.   Review of Systems  Constitutional:  Positive for fatigue. Negative for fever.  HENT:  Negative for sore throat and trouble swallowing.   Eyes:        Dry eyes  Respiratory:  Negative for cough, chest tightness and shortness of breath.   Cardiovascular:  Negative for chest pain and leg swelling.  Musculoskeletal:  Positive for arthralgias, back pain and myalgias.  Skin:  Positive for rash.       Objective:   Physical Exam NAD.  Alert, oriented.  Calm cheerful affect.  Thyroid nontender to palpation, no mass or goiter noted.  Lungs clear.  Heart regular rate rhythm.  Tenderness noted along the lower sacral/coccyx with palpation.  No erythema at this time.  Well-defined area covering the proximal part of the right thumb palmar aspect dry thickened skin with a couple of small fissures.  No active infection noted.  Mildly tender to palpation. Small  rubbery superficial cystic lesion noted upper inner left thigh in the intertriginous area. Mild discoloration but no erythema. Moderately tender to palpation.  Today's Vitals   08/03/23 1405  BP: (!) 96/54  Pulse: 91  Temp: 98.1 F (36.7 C)  SpO2: 95%  Weight: 164 lb (74.4 kg)  Height: 5\' 2"  (1.575 m)   Body mass index is 30 kg/m.        Assessment & Plan:   Problem List Items Addressed This Visit       Musculoskeletal and Integument   Rash and nonspecific skin eruption   Relevant Orders   ANA (Completed)   Rheumatoid factor (Completed)   Sedimentation Rate (Completed)   C-reactive protein (Completed)   Anti-CCP Ab, IgG + IgA (RDL) (Completed)   Ambulatory referral to Rheumatology     Other   Arthralgia of multiple sites - Primary   Relevant Orders   ANA (Completed)   Rheumatoid factor (Completed)   Sedimentation Rate (Completed)   C-reactive protein (Completed)   Anti-CCP Ab, IgG + IgA (RDL) (Completed)   Ambulatory referral to Rheumatology   Dermoid cyst of left upper extremity   Fatigue   Relevant Orders   ANA (Completed)   Rheumatoid factor (Completed)   Sedimentation Rate (Completed)   C-reactive protein (Completed)   Anti-CCP Ab, IgG + IgA (RDL) (Completed)   Ambulatory referral to Rheumatology  Keratoconjunctivitis of both eyes   Relevant Orders   ANA (Completed)   Rheumatoid factor (Completed)   Sedimentation Rate (Completed)   C-reactive protein (Completed)   Anti-CCP Ab, IgG + IgA (RDL) (Completed)   Ambulatory referral to Rheumatology   Meds ordered this encounter  Medications   augmented betamethasone dipropionate (DIPROLENE-AF) 0.05 % cream    Sig: Apply topically 2 (two) times daily as needed for rash    Dispense:  30 g    Refill:  0    Order Specific Question:   Supervising Provider    Answer:   Lilyan Punt A [[id number]]   doxycycline (VIBRAMYCIN) 100 MG capsule    Sig: Take 1 capsule (100 mg total) by mouth 2 (two) times daily.     Dispense:  20 capsule    Refill:  0    Order Specific Question:   Supervising Provider    Answer:   Lilyan Punt A [[id number]]   Doxycyline for cyst. Expect gradual improvement but understands this may come back since the cyst is still there. Warning signs reviewed. Call back if no improvement.  Restart betamethasone cream to rash twice daily.  Discussed use of moisturizers.  Recommend she cover the area when she is using hand sanitizer. Screening labs ordered based on her symptoms. Referral to rheumatology.  Patient understands that this may take several months. Warning signs reviewed.  Further follow-up based on lab report.  Call back to the office sooner if any new or worsening symptoms. Discussed the importance of adequate rest stress reduction and anti-inflammatory diet.

## 2023-08-04 ENCOUNTER — Ambulatory Visit: Payer: Commercial Managed Care - PPO | Admitting: Family Medicine

## 2023-08-04 ENCOUNTER — Other Ambulatory Visit: Payer: Self-pay

## 2023-08-04 ENCOUNTER — Encounter: Payer: Self-pay | Admitting: Nurse Practitioner

## 2023-08-04 DIAGNOSIS — H16203 Unspecified keratoconjunctivitis, bilateral: Secondary | ICD-10-CM | POA: Insufficient documentation

## 2023-08-04 DIAGNOSIS — M2550 Pain in unspecified joint: Secondary | ICD-10-CM | POA: Insufficient documentation

## 2023-08-04 DIAGNOSIS — R5383 Other fatigue: Secondary | ICD-10-CM | POA: Insufficient documentation

## 2023-08-04 DIAGNOSIS — D367 Benign neoplasm of other specified sites: Secondary | ICD-10-CM | POA: Insufficient documentation

## 2023-08-04 DIAGNOSIS — R21 Rash and other nonspecific skin eruption: Secondary | ICD-10-CM | POA: Insufficient documentation

## 2023-08-04 MED ORDER — DOXYCYCLINE HYCLATE 100 MG PO CAPS: 100.0000 mg | ORAL_CAPSULE | Freq: Two times a day (BID) | ORAL | 0 refills | Status: DC

## 2023-08-04 NOTE — Addendum Note (Signed)
Addended by: Campbell Riches on: 08/04/2023 09:47 AM   Modules accepted: Orders

## 2023-08-06 ENCOUNTER — Other Ambulatory Visit: Payer: Self-pay

## 2023-08-06 ENCOUNTER — Encounter: Payer: Self-pay | Admitting: Pharmacist

## 2023-08-07 ENCOUNTER — Other Ambulatory Visit: Payer: Self-pay

## 2023-08-09 ENCOUNTER — Other Ambulatory Visit: Payer: Self-pay

## 2023-08-10 LAB — C-REACTIVE PROTEIN: CRP: 1 mg/L (ref 0–10)

## 2023-08-10 LAB — ANA: Anti Nuclear Antibody (ANA): NEGATIVE

## 2023-08-10 LAB — ANTI-CCP AB, IGG + IGA (RDL): Anti-CCP Ab, IgG + IgA (RDL): <20 U (ref <20)

## 2023-08-10 LAB — SEDIMENTATION RATE: Sed Rate: 7 mm/h (ref 0–40)

## 2023-08-10 LAB — RHEUMATOID FACTOR: Rheumatoid fact SerPl-aCnc: 10.3 [IU]/mL (ref <14.0)

## 2023-08-16 DIAGNOSIS — M79605 Pain in left leg: Secondary | ICD-10-CM | POA: Diagnosis not present

## 2023-08-16 DIAGNOSIS — G6289 Other specified polyneuropathies: Secondary | ICD-10-CM | POA: Diagnosis not present

## 2023-08-16 DIAGNOSIS — M79604 Pain in right leg: Secondary | ICD-10-CM | POA: Diagnosis not present

## 2023-08-16 DIAGNOSIS — G5732 Lesion of lateral popliteal nerve, left lower limb: Secondary | ICD-10-CM | POA: Diagnosis not present

## 2023-08-16 DIAGNOSIS — G5731 Lesion of lateral popliteal nerve, right lower limb: Secondary | ICD-10-CM | POA: Diagnosis not present

## 2023-08-17 DIAGNOSIS — C50412 Malignant neoplasm of upper-outer quadrant of left female breast: Secondary | ICD-10-CM | POA: Diagnosis not present

## 2023-08-22 ENCOUNTER — Telehealth: Payer: Self-pay | Admitting: *Deleted

## 2023-08-22 NOTE — Telephone Encounter (Signed)
Call returned to patient and informed per Dr Smith Robert ok to take flu shot. She thanked me for letting her know

## 2023-08-22 NOTE — Telephone Encounter (Signed)
Patient asking if it is OK to take the flu shot. Please advise

## 2023-08-22 NOTE — Telephone Encounter (Signed)
yes

## 2023-08-25 ENCOUNTER — Other Ambulatory Visit: Payer: Self-pay

## 2023-08-25 DIAGNOSIS — J020 Streptococcal pharyngitis: Secondary | ICD-10-CM | POA: Diagnosis not present

## 2023-08-25 DIAGNOSIS — J029 Acute pharyngitis, unspecified: Secondary | ICD-10-CM | POA: Diagnosis not present

## 2023-08-25 DIAGNOSIS — Z6827 Body mass index (BMI) 27.0-27.9, adult: Secondary | ICD-10-CM | POA: Diagnosis not present

## 2023-08-25 DIAGNOSIS — E663 Overweight: Secondary | ICD-10-CM | POA: Diagnosis not present

## 2023-08-30 ENCOUNTER — Telehealth: Payer: Self-pay | Admitting: *Deleted

## 2023-08-30 NOTE — Telephone Encounter (Signed)
Fax transmission went through

## 2023-09-04 ENCOUNTER — Ambulatory Visit: Payer: Commercial Managed Care - PPO | Admitting: Dietician

## 2023-09-07 DIAGNOSIS — C50412 Malignant neoplasm of upper-outer quadrant of left female breast: Secondary | ICD-10-CM | POA: Diagnosis not present

## 2023-09-08 ENCOUNTER — Ambulatory Visit: Payer: Commercial Managed Care - PPO | Admitting: Dietician

## 2023-09-11 ENCOUNTER — Other Ambulatory Visit: Payer: Self-pay

## 2023-09-18 ENCOUNTER — Encounter: Payer: Self-pay | Admitting: Dietician

## 2023-09-18 ENCOUNTER — Other Ambulatory Visit: Payer: Self-pay

## 2023-09-18 ENCOUNTER — Encounter: Payer: Commercial Managed Care - PPO | Attending: Family Medicine | Admitting: Dietician

## 2023-09-18 DIAGNOSIS — M791 Myalgia, unspecified site: Secondary | ICD-10-CM | POA: Diagnosis not present

## 2023-09-18 DIAGNOSIS — R5382 Chronic fatigue, unspecified: Secondary | ICD-10-CM | POA: Diagnosis not present

## 2023-09-18 DIAGNOSIS — R638 Other symptoms and signs concerning food and fluid intake: Secondary | ICD-10-CM | POA: Insufficient documentation

## 2023-09-18 DIAGNOSIS — M2550 Pain in unspecified joint: Secondary | ICD-10-CM | POA: Diagnosis not present

## 2023-09-18 DIAGNOSIS — F4323 Adjustment disorder with mixed anxiety and depressed mood: Secondary | ICD-10-CM | POA: Diagnosis not present

## 2023-09-18 DIAGNOSIS — R21 Rash and other nonspecific skin eruption: Secondary | ICD-10-CM | POA: Diagnosis not present

## 2023-09-18 NOTE — Progress Notes (Signed)
Medical Nutrition Therapy  Appointment Start time:  1045  Appointment End time:  1125  [employer] Wellness Visit 3 of 3 EID#: [id number]  Primary concerns today: A1c and triglycerides  Referral diagnosis: R63.8 - Alteration in Nutrition Preferred learning style: No preference indicated Learning readiness: Change in progress   NUTRITION ASSESSMENT   Clinical Medical Hx: Cancer, Prediabetes, GERD, GAD/MDD Medications: Ondansetron, Exemestane, Rizatriptan, Trazodone, Anastrazole Labs (05/22/2023): A1c - 6.0%, TGL - 282 mg/dL, (9/52/8413) Glucose - 104 Notable Signs/Symptoms: None  Lifestyle & Dietary Hx Pt reports FBG values are ~110 now, none higher than 120.  Pt reports cutting down on their meat consumption, having 1-2 times a week, having cottage cheese, PB, beans for protein.  Pt reports looking into new recipes, but has not been able to cook any yet. Pt reports stress is high currently, related to significant family issues, has not been able to sleep well, states this stress has been interfering with all aspects of their life.    Estimated daily fluid intake: 64 oz Supplements: O3 FAs, Calcium, Vit D, Probiotic Sleep: 6-7 hrs, uninterrupted Stress / self-care: Moderate Current average weekly physical activity: NEW: Exercises ~30 minutes each day, walking   24-Hr Dietary Recall First Meal: Oatmeal Snack: none Second Meal: Veggie quesedilla, guacamole, chips Snack:  Third Meal: None Snack: Popcorn Beverages: water, unsweet tea    NUTRITION DIAGNOSIS  NB-1.1 Food and nutrition-related knowledge deficit As related to Prediabetes, Hypertriglyceridemia.  As evidenced by A1c of 6.0%, TGL of 282 mg/dL.   NUTRITION INTERVENTION  Nutrition education (E-1) on the following topics:  Educated patient on the balanced plate eating model. Recommended lunch and dinner be 1/2 non-starchy vegetables, 1/4 starches, and 1/4 protein. Recommended breakfast be a balance of starch  and protein with a piece of fruit. Discussed with patient the importance of working towards hitting the proportions of the balanced plate consistently. Educated patient on the pathophysiology of diabetes. This includes why our bodies need circulating blood sugar, the relationship between insulin and blood sugar, and the results of insulin resistance and/or pancreatic insufficiency on the development of diabetes. Educated patient on factors that contribute to elevation of blood sugars, such as stress, illness, injury,and food choices. Discussed the role that physical activity plays in lowering blood sugar. Educate patient on the three main macronutrients. Protein, fats, and carbohydrates. Discussed how each of these macronutrients affect blood sugar levels, especially carbohydrate, and the importance of eating a consistent amount of carbohydrate throughout the day.    Handouts Provided Include  Balanced Plate  Balanced Plate Food List   Learning Style & Readiness for Change Teaching method utilized: Visual & Auditory  Demonstrated degree of understanding via: Teach Back  Barriers to learning/adherence to lifestyle change: None   Goals Established by Pt Keep up the great work!    MONITORING & EVALUATION Dietary intake and weekly physical activity PRN  Next Steps  Patient is to follow up with RD PRN.

## 2023-09-20 DIAGNOSIS — D485 Neoplasm of uncertain behavior of skin: Secondary | ICD-10-CM | POA: Diagnosis not present

## 2023-09-20 DIAGNOSIS — L308 Other specified dermatitis: Secondary | ICD-10-CM | POA: Diagnosis not present

## 2023-09-20 DIAGNOSIS — L309 Dermatitis, unspecified: Secondary | ICD-10-CM | POA: Diagnosis not present

## 2023-09-22 ENCOUNTER — Other Ambulatory Visit: Payer: Self-pay

## 2023-09-25 ENCOUNTER — Encounter: Payer: Self-pay | Admitting: Oncology

## 2023-09-25 ENCOUNTER — Ambulatory Visit: Payer: Commercial Managed Care - PPO | Attending: General Surgery

## 2023-09-25 ENCOUNTER — Inpatient Hospital Stay: Payer: Commercial Managed Care - PPO

## 2023-09-25 ENCOUNTER — Inpatient Hospital Stay (HOSPITAL_BASED_OUTPATIENT_CLINIC_OR_DEPARTMENT_OTHER): Payer: Commercial Managed Care - PPO | Admitting: Oncology

## 2023-09-25 ENCOUNTER — Inpatient Hospital Stay: Payer: Commercial Managed Care - PPO | Attending: Oncology

## 2023-09-25 ENCOUNTER — Encounter: Payer: Self-pay | Admitting: *Deleted

## 2023-09-25 VITALS — BP 109/74 | HR 89 | Temp 97.8°F | Resp 17 | Ht 62.0 in | Wt 163.4 lb

## 2023-09-25 VITALS — BP 116/76 | HR 65 | Temp 97.1°F | Resp 18

## 2023-09-25 VITALS — Wt 162.0 lb

## 2023-09-25 DIAGNOSIS — Z803 Family history of malignant neoplasm of breast: Secondary | ICD-10-CM | POA: Insufficient documentation

## 2023-09-25 DIAGNOSIS — C50912 Malignant neoplasm of unspecified site of left female breast: Secondary | ICD-10-CM | POA: Diagnosis not present

## 2023-09-25 DIAGNOSIS — Z79811 Long term (current) use of aromatase inhibitors: Secondary | ICD-10-CM

## 2023-09-25 DIAGNOSIS — Z483 Aftercare following surgery for neoplasm: Secondary | ICD-10-CM | POA: Insufficient documentation

## 2023-09-25 DIAGNOSIS — Z90710 Acquired absence of both cervix and uterus: Secondary | ICD-10-CM | POA: Insufficient documentation

## 2023-09-25 DIAGNOSIS — Z08 Encounter for follow-up examination after completed treatment for malignant neoplasm: Secondary | ICD-10-CM

## 2023-09-25 DIAGNOSIS — Z1721 Progesterone receptor positive status: Secondary | ICD-10-CM | POA: Insufficient documentation

## 2023-09-25 DIAGNOSIS — Z9221 Personal history of antineoplastic chemotherapy: Secondary | ICD-10-CM | POA: Insufficient documentation

## 2023-09-25 DIAGNOSIS — Z800 Family history of malignant neoplasm of digestive organs: Secondary | ICD-10-CM | POA: Insufficient documentation

## 2023-09-25 DIAGNOSIS — Z853 Personal history of malignant neoplasm of breast: Secondary | ICD-10-CM | POA: Diagnosis not present

## 2023-09-25 DIAGNOSIS — Z923 Personal history of irradiation: Secondary | ICD-10-CM | POA: Diagnosis not present

## 2023-09-25 DIAGNOSIS — Z170 Estrogen receptor positive status [ER+]: Secondary | ICD-10-CM | POA: Insufficient documentation

## 2023-09-25 DIAGNOSIS — Z1732 Human epidermal growth factor receptor 2 negative status: Secondary | ICD-10-CM | POA: Insufficient documentation

## 2023-09-25 DIAGNOSIS — Z5181 Encounter for therapeutic drug level monitoring: Secondary | ICD-10-CM

## 2023-09-25 DIAGNOSIS — Z7983 Long term (current) use of bisphosphonates: Secondary | ICD-10-CM | POA: Diagnosis not present

## 2023-09-25 LAB — COMPREHENSIVE METABOLIC PANEL
ALT: 28 U/L (ref 0–44)
AST: 21 U/L (ref 15–41)
Albumin: 3.9 g/dL (ref 3.5–5.0)
Alkaline Phosphatase: 59 U/L (ref 38–126)
Anion gap: 7 (ref 5–15)
BUN: 12 mg/dL (ref 6–20)
CO2: 25 mmol/L (ref 22–32)
Calcium: 9.2 mg/dL (ref 8.9–10.3)
Chloride: 106 mmol/L (ref 98–111)
Creatinine, Ser: 0.67 mg/dL (ref 0.44–1.00)
GFR, Estimated: >60 mL/min (ref >60)
Glucose, Bld: 125 mg/dL — ABNORMAL HIGH (ref 70–99)
Potassium: 3.7 mmol/L (ref 3.5–5.1)
Sodium: 138 mmol/L (ref 135–145)
Total Bilirubin: 0.3 mg/dL (ref 0.3–1.2)
Total Protein: 6.8 g/dL (ref 6.5–8.1)

## 2023-09-25 MED ORDER — ZOLEDRONIC ACID 4 MG/100ML IV SOLN
4.0000 mg | INTRAVENOUS | Status: DC
  Administered 2023-09-25: 4 mg via INTRAVENOUS
  Filled 2023-09-25: qty 100

## 2023-09-25 MED ORDER — SODIUM CHLORIDE 0.9 % IV SOLN
Freq: Once | INTRAVENOUS | Status: AC
  Filled 2023-09-25: qty 250

## 2023-09-25 NOTE — Progress Notes (Signed)
Hematology/Oncology Consult note [facility]  Telephone:([phone] [phone]  Patient Care Team: [name], DO as PCP - General (Family Medicine) West Bali, MD (Inactive) as Consulting Physician (Gastroenterology) Hulen Luster, RN as Oncology Nurse Navigator Carmina Miller, MD as Consulting Physician (Radiation Oncology) Creig Hines, MD as Consulting Physician (Oncology) Emelia Loron, MD as Consulting Physician (General Surgery)   Name of the patient: Nichole Brown  [id number]  09-Apr-1966   Date of visit: 09/25/23  Diagnosis-  pathological prognostic stage 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  This was followed by a diagnostic mammogram and ultrasound which showed multiple benign cysts noted at the 1 o'clock position.  At the 2 o'clock position 3 cm from the nipple was a oval hypoechoic mass measuring 5 x 4 x 3 mm.  No suspicious left axillary adenopathy.  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   Final pathology showed grade two 7 mm tumor with negative margins.  1 sentinel lymph node negative for malignancy.  Oncotype results showed a recurrence score of 28 and absolute benefit of chemotherapy at greater than 15%.   Patient completed 4 cycles of adjuvant TC chemotherapy in February 2024.  She also completed adjuvant radiation and started taking Arimidex in March 2024.  She is also on adjuvant Zometa.     Interval history-patient reports on and off pain especially in her bilateral knees and legs.   Some of the symptoms had been present even prior to starting endocrine therapy.  Denies any breast concerns.  Appetite and weight have remained stable  ECOG PS- 0 Pain scale- 0   Review of systems- Review of Systems  Constitutional:  Negative for chills, fever and weight loss.  HENT:  Negative for congestion, ear discharge and nosebleeds.   Eyes:  Negative for blurred vision.  Respiratory:  Negative for cough, hemoptysis, sputum production, shortness of breath and wheezing.   Cardiovascular:  Negative for chest pain, palpitations, orthopnea and claudication.  Gastrointestinal:  Negative for abdominal pain, blood in stool, constipation, diarrhea, heartburn, melena, nausea and vomiting.  Genitourinary:  Negative for dysuria, flank pain, frequency, hematuria and urgency.  Musculoskeletal:  Positive for joint pain. Negative for back pain and myalgias.  Skin:  Negative for rash.  Neurological:  Negative for dizziness, tingling, focal weakness, seizures, weakness and headaches.  Endo/Heme/Allergies:  Does not bruise/bleed easily.  Psychiatric/Behavioral:  Negative for depression and suicidal ideas. The patient does not have insomnia.       Allergies  Allergen Reactions   Cymbalta [Duloxetine Hcl] Other (See Comments)    Hallucinations; sleep walking   Nortriptyline Nausea And Vomiting   Phenergan [Promethazine Hcl]     Restless legs and cramping     Past Medical History:  Diagnosis Date   Anxiety    Bronchitis    Cancer (HCC) 09/2022   left breast IMC   Complication of anesthesia    PONV   Contusion of leg 10/06/2015   Depression    GERD (gastroesophageal reflux disease)  Insomnia    Migraine headache    Pneumonia    2000   PONV (postoperative nausea and vomiting)      Past Surgical History:  Procedure Laterality Date   ABDOMINAL HYSTERECTOMY     BALLOON DILATION N/A 12/14/2020   Procedure: BALLOON DILATION;  Surgeon: Lanelle Bal, DO;  [facility];  Service: Endoscopy;  Laterality: N/A;   BIOPSY  12/14/2020   Procedure: BIOPSY;  Surgeon: Lanelle Bal, DO;  [facility];  Service: Endoscopy;;   BREAST BIOPSY Left 08/24/2022   [place] bx, coil marker +   BREAST LUMPECTOMY WITH RADIOACTIVE SEED AND SENTINEL LYMPH NODE BIOPSY Left 09/29/2022   Invasive ductal carcinoma, grade 2, pT1b, pN0  - Margins are negative for carcinoma in situ or invasive carcinoma   CESAREAN SECTION     CHOLECYSTECTOMY  1988   COLONOSCOPY N/A 12/10/2018    six 4-8 mm polyps in proximal descending colon, mid transverse and hepatic flexure. Diverticulosis. External and internal hemorrhoids. Torturous colon. Two simple adenomas, two serrated polyps, 2 hyperplastic polyps. Surveillance due Dec 2022.    ESOPHAGOGASTRODUODENOSCOPY (EGD) WITH PROPOFOL N/A 12/14/2020   Benign-appearing esophageal stenosis s/p dilation, gastritis, normal duodenum. Mild chronic gastritis.    open heart surgery  1967   PATENT DUCTUS ARTERIOUS REPAIR  1967   POLYPECTOMY  12/10/2018   Procedure: POLYPECTOMY;  Surgeon: West Bali, MD;  [facility];  Service: Endoscopy;;  hepatic flexure, descending, transverse   PORT-A-CATH REMOVAL Right 03/14/2023   Procedure: REMOVAL PORT-A-CATH;  Surgeon: Emelia Loron, MD;  Location: [facility];  Service: General;  Laterality: Right;   PORTACATH PLACEMENT N/A 11/14/2022   Procedure: PORT PLACEMENT WITH ULTRASOUND GUIDANCE;  Surgeon: Emelia Loron, MD;  Location:  SURGERY CENTER;  Service: General;  Laterality: N/A;   SHOULDER ARTHROSCOPY Left 06/16/2017   Procedure: LEFT SHOULDER ARTHROSCOPY, DEBRIDEMENT, AND DECOMPRESSION;  Surgeon: Nadara Mustard, MD;  [facility];  Service: Orthopedics;  Laterality: Left;   SHOULDER ARTHROSCOPY WITH SUBACROMIAL DECOMPRESSION Left 10/03/2019   Procedure: SHOULDER ARTHROSCOPY WITH DEBRIDEMENT, DECOMPRESSION;  Surgeon: Christena Flake, MD;  Location: [facility];  Service: Orthopedics;  Laterality: Left;   SHOULDER SURGERY Left 2008   [facility] joint    Social History   Socioeconomic History   Marital status: Married    Spouse name: daniel   Number of children: 4   Years of education: Not on file   Highest education level: Some college, no degree  Occupational History   Not on file  Tobacco Use   Smoking status: Never   Smokeless tobacco: Never  Vaping Use   Vaping status: Never Used  Substance and Sexual Activity   Alcohol use: Not Currently    Comment: occassionally-not since chemo started   Drug use: No   Sexual activity: Yes    Birth control/protection: Surgical    Comment: hyst  Other Topics Concern   Not on file  Social History Narrative   Not on file   Social Determinants of Health   Financial Resource Strain: Low Risk  (05/15/2023)   Overall Financial Resource Strain (CARDIA)    Difficulty of Paying Living Expenses: Not hard at all  Food Insecurity: No Food Insecurity (05/15/2023)   Hunger Vital Sign    Worried About Running Out of Food in the Last Year: Never true    Ran Out of Food in the Last Year: Never true  Transportation Needs: No  Transportation Needs (05/15/2023)   PRAPARE - Administrator, Civil Service (Medical): No    Lack of Transportation (Non-Medical): No  Physical Activity: Unknown (05/15/2023)   Exercise Vital Sign    Days of Exercise per Week: 5 days    Minutes of Exercise per Session: Not on file  Stress: Stress Concern Present (05/15/2023)   [employer] of Occupational Health - Occupational Stress Questionnaire    Feeling of Stress : Rather much  Social Connections: Socially Integrated (05/15/2023)   Social Connection and Isolation Panel [NHANES]    Frequency of Communication with Friends and Family: Three times a week    Frequency of Social Gatherings with Friends and Family: Three times a week    Attends Religious Services: More than 4 times per year    Active Member of Clubs or  Organizations: Yes    Attends Banker Meetings: 1 to 4 times per year    Marital Status: Married  Catering manager Violence: Not At Risk (07/12/2022)   Humiliation, Afraid, Rape, and Kick questionnaire    Fear of Current or Ex-Partner: No    Emotionally Abused: No    Physically Abused: No    Sexually Abused: No    Family History  Problem Relation Age of Onset   Breast cancer Mother 46   Hypertension Father    Colon cancer Paternal Uncle        dx 82s   Hypertension Maternal Grandmother    Diabetes Maternal Grandmother    Heart disease Maternal Grandmother 40       MI   Hypertension Maternal Grandfather    Throat cancer Maternal Grandfather 63     Current Outpatient Medications:    acetaminophen (TYLENOL) 500 MG tablet, Take 500 mg by mouth every 6 (six) hours as needed for moderate pain., Disp: , Rfl:    anastrozole (ARIMIDEX) 1 MG tablet, Take 1 tablet (1 mg total) by mouth daily. Start after pt finishes radiation. Pt knows, Disp: 30 tablet, Rfl: 5   augmented betamethasone dipropionate (DIPROLENE-AF) 0.05 % cream, Apply topically 2 (two) times daily as needed for rash, Disp: 30 g, Rfl: 0   CALCIUM PO, Take 1 capsule by mouth daily., Disp: , Rfl:    Lifitegrast (XIIDRA) 5 % SOLN, Instill 1 drop into both eyes twice a day, Disp: 180 each, Rfl: 4   omega-3 acid ethyl esters (LOVAZA) 1 g capsule, Take by mouth 2 (two) times daily., Disp: , Rfl:    ondansetron (ZOFRAN-ODT) 4 MG disintegrating tablet, Take 1 tablet (4 mg total) by mouth every 8 (eight) hours as needed for Nausea migraine, Disp: 20 tablet, Rfl: 11   Probiotic Product (PROBIOTIC PO), Take 1 capsule by mouth daily., Disp: , Rfl:    rizatriptan (MAXALT-MLT) 10 MG disintegrating tablet, Take 1 tablet (10 mg total) by mouth daily as needed (may repeat dose once in > 2 hrs) for up to 1 dose, Disp: 10 tablet, Rfl: 11   valACYclovir (VALTREX) 500 MG tablet, Take 1 tablet (500 mg total) by mouth daily for  suppression. Take 2 tablets daily (1000 mg) by mouth daily for 5 days for acute outbreak., Disp: 90 tablet, Rfl: 1   doxycycline (VIBRAMYCIN) 100 MG capsule, Take 1 capsule (100 mg total) by mouth 2 (two) times daily. (Patient not taking: Reported on 09/25/2023), Disp: 20 capsule, Rfl: 0   famotidine (PEPCID) 20 MG tablet, Take 20 mg by mouth 2 (two) times daily. (Patient not taking: Reported on  09/25/2023), Disp: , Rfl:    pantoprazole (PROTONIX) 20 MG tablet, Take 1 tablet (20 mg total) by mouth daily. (Patient not taking: Reported on 09/25/2023), Disp: 30 tablet, Rfl: 3   pregabalin (LYRICA) 25 MG capsule, Take 1 capsule (25 mg total) by mouth 2 (two) times daily. (Patient not taking: Reported on 09/25/2023), Disp: 60 capsule, Rfl: 11   tiZANidine (ZANAFLEX) 4 MG capsule, Take 1 capsule (4 mg total) by mouth 3 (three) times daily. (Patient not taking: Reported on 09/25/2023), Disp: 90 capsule, Rfl: 3   topiramate (TOPAMAX) 25 MG tablet, Take 1 tablet (25 mg total) by mouth 2 (two) times daily (Patient not taking: Reported on 09/25/2023), Disp: 60 tablet, Rfl: 11   traZODone (DESYREL) 50 MG tablet, Take 1 tablet (50 mg total) by mouth at bedtime as needed for sleep. (Patient not taking: Reported on 09/25/2023), Disp: 90 tablet, Rfl: 1   Varenicline Tartrate (TYRVAYA) 0.03 MG/ACT SOLN, Administer 1 spray nasally every 12 hours. (Patient not taking: Reported on 09/25/2023), Disp: 4.2 mL, Rfl: 6 No current facility-administered medications for this visit.  Facility-Administered Medications Ordered in Other Visits:    Zoledronic Acid (ZOMETA) IVPB 4 mg, 4 mg, Intravenous, Q90 days, Creig Hines, MD, Stopped at 09/25/23 1432  Physical exam:  Vitals:   09/25/23 1330  BP: 109/74  Pulse: 89  Resp: 17  Temp: 97.8 F (36.6 C)  TempSrc: Tympanic  SpO2: 98%  Weight: 163 lb 6.4 oz (74.1 kg)  Height: 5\' 2"  (1.575 m)   Physical Exam Cardiovascular:     Rate and Rhythm: Normal rate and regular  rhythm.     Heart sounds: Normal heart sounds.  Pulmonary:     Effort: Pulmonary effort is normal.     Breath sounds: Normal breath sounds.  Skin:    General: Skin is warm and dry.  Neurological:     Mental Status: She is alert and oriented to person, place, and time.    Breast exam was performed in seated and lying down position. Patient is status post left lumpectomy with a well-healed surgical scar. No evidence of any palpable masses. No evidence of axillary adenopathy. No evidence of any palpable masses or lumps in the right breast. No evidence of right axillary adenopathy .     Latest Ref Rng & Units 09/25/2023    1:04 PM  CMP  Glucose 70 - 99 mg/dL 621   BUN 6 - 20 mg/dL 12   Creatinine 3.08 - 1.00 mg/dL 6.57   Sodium 846 - 962 mmol/L 138   Potassium 3.5 - 5.1 mmol/L 3.7   Chloride 98 - 111 mmol/L 106   CO2 22 - 32 mmol/L 25   Calcium 8.9 - 10.3 mg/dL 9.2   Total Protein 6.5 - 8.1 g/dL 6.8   Total Bilirubin 0.3 - 1.2 mg/dL 0.3   Alkaline Phos 38 - 126 U/L 59   AST 15 - 41 U/L 21   ALT 0 - 44 U/L 28       Latest Ref Rng & Units 06/23/2023   12:42 PM  CBC  WBC 4.0 - 10.5 K/uL 7.1   Hemoglobin 12.0 - 15.0 g/dL 95.2   Hematocrit 84.1 - 46.0 % 40.7   Platelets 150 - 400 K/uL 215      Assessment and plan- Patient is a 57 y.o. female with history of stage I left breast cancer ER/PR positive HER2 negative s/p adjuvant chemotherapy and radiation.  She is presently on Arimidex and  Zometa and this is a routine follow-up visit  Patient is tolerating Arimidex well along with calcium and vitamin D.  She does report bilateral knee pain as well as pain in her thighs but some of the symptoms have been present even prior to starting endocrine therapy.  She is okay with continuing with Arimidex at this point.  Patient is on adjuvant Zometa given high risk breast cancer and this is dose 3 out of 6.  Calcium levels acceptable to proceed with Zometa today.  Labs and I will see her  back in 3 months for next dose of Zometa   Visit Diagnosis 1. Encounter for follow-up surveillance of breast cancer   2. Visit for monitoring Arimidex therapy   3. Encounter for monitoring zoledronic acid therapy      Dr. Owens Shark, MD, MPH [facility] [phone] 09/25/2023 4:09 PM

## 2023-09-25 NOTE — Therapy (Signed)
OUTPATIENT PHYSICAL THERAPY SOZO SCREENING NOTE   Patient Name: Nichole Brown MRN: [id number] DOB:1966-05-06, 57 y.o., female Today's Date: 09/25/2023  PCP: [name], DO REFERRING PROVIDER: Emelia Loron, MD   PT End of Session - 09/25/23 [phone]     Visit Number 6   # unchanged due to screen only   PT Start Time 0800    PT Stop Time 0806    PT Time Calculation (min) 6 min    Activity Tolerance Patient tolerated treatment well    Behavior During Therapy [facility] for tasks assessed/performed             Past Medical History:  Diagnosis Date   Anxiety    Bronchitis    Cancer (HCC) 09/2022   left breast IMC   Complication of anesthesia    PONV   Contusion of leg 10/06/2015   Depression    GERD (gastroesophageal reflux disease)    Insomnia    Migraine headache    Pneumonia    2000   PONV (postoperative nausea and vomiting)    Past Surgical History:  Procedure Laterality Date   ABDOMINAL HYSTERECTOMY     BALLOON DILATION N/A 12/14/2020   Procedure: BALLOON DILATION;  Surgeon: Lanelle Bal, DO;  [facility];  Service: Endoscopy;  Laterality: N/A;   BIOPSY  12/14/2020   Procedure: BIOPSY;  Surgeon: Lanelle Bal, DO;  [facility];  Service: Endoscopy;;   BREAST BIOPSY Left 08/24/2022   [place] bx, coil marker +   BREAST LUMPECTOMY WITH RADIOACTIVE SEED AND SENTINEL LYMPH NODE BIOPSY Left 09/29/2022   Invasive ductal carcinoma, grade 2, pT1b, pN0  - Margins are negative for carcinoma in situ or invasive carcinoma   CESAREAN SECTION     CHOLECYSTECTOMY  1988   COLONOSCOPY N/A 12/10/2018    six 4-8 mm polyps in proximal descending colon, mid transverse and hepatic flexure. Diverticulosis. External and internal hemorrhoids. Torturous colon. Two simple adenomas, two serrated polyps, 2 hyperplastic polyps. Surveillance due Dec 2022.    ESOPHAGOGASTRODUODENOSCOPY (EGD) WITH PROPOFOL N/A 12/14/2020   Benign-appearing esophageal stenosis s/p  dilation, gastritis, normal duodenum. Mild chronic gastritis.    open heart surgery  1967   PATENT DUCTUS ARTERIOUS REPAIR  1967   POLYPECTOMY  12/10/2018   Procedure: POLYPECTOMY;  Surgeon: West Bali, MD;  [facility];  Service: Endoscopy;;  hepatic flexure, descending, transverse   PORT-A-CATH REMOVAL Right 03/14/2023   Procedure: REMOVAL PORT-A-CATH;  Surgeon: Emelia Loron, MD;  Location: [facility];  Service: General;  Laterality: Right;   PORTACATH PLACEMENT N/A 11/14/2022   Procedure: PORT PLACEMENT WITH ULTRASOUND GUIDANCE;  Surgeon: Emelia Loron, MD;  Location: [facility];  Service: General;  Laterality: N/A;   SHOULDER ARTHROSCOPY Left 06/16/2017   Procedure: LEFT SHOULDER ARTHROSCOPY, DEBRIDEMENT, AND DECOMPRESSION;  Surgeon: Nadara Mustard, MD;  Location: [facility];  Service: Orthopedics;  Laterality: Left;   SHOULDER ARTHROSCOPY WITH SUBACROMIAL DECOMPRESSION Left 10/03/2019   Procedure: SHOULDER ARTHROSCOPY WITH DEBRIDEMENT, DECOMPRESSION;  Surgeon: Christena Flake, MD;  Location: [facility];  Service: Orthopedics;  Laterality: Left;   SHOULDER SURGERY Left 2008   [facility] joint   Patient Active Problem List   Diagnosis Date Noted   Arthralgia of multiple sites 08/04/2023   Fatigue 08/04/2023   Rash and nonspecific skin eruption 08/04/2023   Keratoconjunctivitis of both eyes 08/04/2023   Dermoid cyst of left upper extremity 08/04/2023   Malignant neoplasm  of upper-outer quadrant of left breast in female, estrogen receptor positive (HCC) 08/29/2022   Depression, major, single episode, moderate (HCC) 01/01/2019   Generalized anxiety disorder 01/01/2019   Gastroesophageal reflux disease 12/26/2016   Migraine headache 07/24/2013   Insomnia 05/30/2013    REFERRING DIAG: left breast cancer at risk for lymphedema  THERAPY DIAG: Aftercare following surgery for neoplasm  PERTINENT HISTORY: This mass was biopsied and was consistent  with invasive mammary carcinoma grade 1 ER positive greater than 90%, PR +31 to 40% and HER2 negative. Hx of 3 shoulder surgeries on the left prior to surgery.   Had a left lumpectomy and SLNB on 09/29/22.  1 negative lymph node removed   PRECAUTIONS: left UE Lymphedema risk, None  SUBJECTIVE: Pt returns for her 3 month L-Dex screen.   PAIN:  Are you having pain? No  SOZO SCREENING: Patient was assessed today using the SOZO machine to determine the lymphedema index score. This was compared to her baseline score. It was determined that she is within the recommended range when compared to her baseline and no further action is needed at this time. She will continue SOZO screenings. These are done every 3 months for 2 years post operatively followed by every 6 months for 2 years, and then annually.   L-DEX FLOWSHEETS - 09/25/23 0800       L-DEX LYMPHEDEMA SCREENING   Measurement Type Unilateral    L-DEX MEASUREMENT EXTREMITY Upper Extremity    POSITION  Standing    DOMINANT SIDE Right    At Risk Side Left    BASELINE SCORE (UNILATERAL) -3.9    L-DEX SCORE (UNILATERAL) -4.7    VALUE CHANGE (UNILAT) -0.8               Hermenia Bers, PTA 09/25/2023, 8:07 AM

## 2023-10-02 DIAGNOSIS — F4323 Adjustment disorder with mixed anxiety and depressed mood: Secondary | ICD-10-CM | POA: Diagnosis not present

## 2023-10-09 DIAGNOSIS — F4323 Adjustment disorder with mixed anxiety and depressed mood: Secondary | ICD-10-CM | POA: Diagnosis not present

## 2023-10-12 ENCOUNTER — Encounter: Payer: Self-pay | Admitting: Nurse Practitioner

## 2023-10-16 DIAGNOSIS — F4323 Adjustment disorder with mixed anxiety and depressed mood: Secondary | ICD-10-CM | POA: Diagnosis not present

## 2023-10-18 ENCOUNTER — Other Ambulatory Visit: Payer: Self-pay

## 2023-10-20 ENCOUNTER — Ambulatory Visit (INDEPENDENT_AMBULATORY_CARE_PROVIDER_SITE_OTHER): Payer: Commercial Managed Care - PPO | Admitting: Nurse Practitioner

## 2023-10-20 ENCOUNTER — Other Ambulatory Visit: Payer: Self-pay

## 2023-10-20 VITALS — BP 101/65 | HR 81 | Temp 99.9°F | Ht 62.0 in | Wt 164.6 lb

## 2023-10-20 DIAGNOSIS — M2550 Pain in unspecified joint: Secondary | ICD-10-CM | POA: Diagnosis not present

## 2023-10-20 DIAGNOSIS — M797 Fibromyalgia: Secondary | ICD-10-CM

## 2023-10-20 MED ORDER — DOXYCYCLINE HYCLATE 100 MG PO CAPS
100.0000 mg | ORAL_CAPSULE | Freq: Two times a day (BID) | ORAL | 0 refills | Status: DC
  Filled 2023-10-20: qty 20, 10d supply, fill #0

## 2023-10-20 NOTE — Progress Notes (Signed)
Subjective:    Patient ID: Nichole Brown, female    DOB: 1966/05/31, 57 y.o.   MRN: [id number]  HPI Presents to discuss her most recent diagnoses by her rheumatologist.  According to patient she may have psoriatic arthritis.  A new medication has been called in but she has not started it yet.  Has also been told that she has fibromyalgia, has been diagnosed with this in the past..  Patient has questions about how this is different.  Has persistent arthralgias.  Fatigue.  Generalized migratory muscle aches with tingling and nerve pain at times.  No fevers.  Has a follow-up appoint with rheumatology in about 1 month.  Note that patient has stopped taking her Lyrica since it did not help her symptoms.  Patient is on Arimidex for breast cancer prevention.  Had a bone density done 1 year ago.  In addition has seen [facility] for orthopedic issues including osteoarthritis. Has been doing better lately with her self-care.  Has also started doing yoga.   Review of Systems  Constitutional:  Positive for fatigue. Negative for fever.  Respiratory:  Negative for cough, chest tightness, shortness of breath and wheezing.   Cardiovascular:  Negative for chest pain and leg swelling.  Musculoskeletal:  Positive for arthralgias, myalgias and neck stiffness.      10/20/2023    9:02 AM  Depression screen PHQ 2/9  Decreased Interest 0  Down, Depressed, Hopeless 1  PHQ - 2 Score 1  Altered sleeping 2  Tired, decreased energy 2  Change in appetite 1  Feeling bad or failure about yourself  0  Trouble concentrating 0  Moving slowly or fidgety/restless 1  Suicidal thoughts 0  PHQ-9 Score 7      10/20/2023    9:02 AM 08/03/2023    2:17 PM 06/07/2023    2:25 PM 07/12/2022    1:39 PM  GAD 7 : Generalized Anxiety Score  Nervous, Anxious, on Edge 1 1 1    Control/stop worrying 1 0 0 1  Worry too much - different things 1 0 0 1  Trouble relaxing 1 1 1 1   Restless 1 1 0 1  Easily annoyed or irritable 1 1 0  1  Afraid - awful might happen 1 0 1 2  Total GAD 7 Score 7 4 3    Anxiety Difficulty  Somewhat difficult Not difficult at all          Objective:   Physical Exam NAD.  Alert, oriented.  Calm cheerful affect.  Lungs clear.  Heart regular rate rhythm. Today's Vitals   10/20/23 0853  BP: 101/65  Pulse: 81  Temp: 99.9 F (37.7 C)  SpO2: 97%  Weight: 164 lb 9.6 oz (74.7 kg)  Height: 5\' 2"  (1.575 m)   Body mass index is 30.11 kg/m.        Assessment & Plan:   Problem List Items Addressed This Visit       Other   Arthralgia of multiple sites - Primary   Fibromyalgia  Discussed the differences in her various diagnoses. Follow-up with rheumatology as planned.  Also recommend recheck with orthopedist if continued joint pain. Because she will be starting a new medication for her possible psoriatic arthritis, hold on any new medications at this time.  Discussed considering amitriptyline, duloxetine or gabapentin for fibromyalgia. Lengthy discussion regarding the connection between mental health and stress and fibromyalgia.  Discussed the importance of an anti-inflammatory diet, stress reduction, regular activity and adequate rest.  Follow-up here if she wishes to start medication for her fibromyalgia.

## 2023-10-23 ENCOUNTER — Ambulatory Visit
Admission: RE | Admit: 2023-10-23 | Discharge: 2023-10-23 | Disposition: A | Discharge status: Home / Self Care | Payer: Commercial Managed Care - PPO | Source: Ambulatory Visit | Attending: Radiation Oncology | Admitting: Radiation Oncology

## 2023-10-23 ENCOUNTER — Telehealth: Payer: Self-pay

## 2023-10-23 ENCOUNTER — Other Ambulatory Visit: Payer: Self-pay

## 2023-10-23 ENCOUNTER — Encounter: Payer: Self-pay | Admitting: Radiation Oncology

## 2023-10-23 ENCOUNTER — Other Ambulatory Visit: Payer: Self-pay | Admitting: Family Medicine

## 2023-10-23 VITALS — BP 100/80 | HR 76 | Resp 16 | Ht 62.0 in | Wt 164.6 lb

## 2023-10-23 DIAGNOSIS — F4323 Adjustment disorder with mixed anxiety and depressed mood: Secondary | ICD-10-CM | POA: Diagnosis not present

## 2023-10-23 DIAGNOSIS — Z79811 Long term (current) use of aromatase inhibitors: Secondary | ICD-10-CM | POA: Diagnosis not present

## 2023-10-23 DIAGNOSIS — Z923 Personal history of irradiation: Secondary | ICD-10-CM | POA: Diagnosis not present

## 2023-10-23 DIAGNOSIS — Z170 Estrogen receptor positive status [ER+]: Secondary | ICD-10-CM | POA: Insufficient documentation

## 2023-10-23 DIAGNOSIS — C50412 Malignant neoplasm of upper-outer quadrant of left female breast: Secondary | ICD-10-CM | POA: Diagnosis not present

## 2023-10-23 MED ORDER — TOPIRAMATE 25 MG PO TABS
25.0000 mg | ORAL_TABLET | Freq: Two times a day (BID) | ORAL | 5 refills | Status: DC
  Filled 2023-10-23: qty 60, 30d supply, fill #0
  Filled 2023-11-18: qty 60, 30d supply, fill #1
  Filled 2023-12-04 – 2024-01-06 (×2): qty 60, 30d supply, fill #2

## 2023-10-23 MED ORDER — DOXYCYCLINE HYCLATE 100 MG PO TABS
100.0000 mg | ORAL_TABLET | Freq: Two times a day (BID) | ORAL | 0 refills | Status: DC
  Filled 2023-10-23: qty 14, 7d supply, fill #0

## 2023-10-23 NOTE — Progress Notes (Signed)
Radiation Oncology Follow up Note  Name: Nichole Brown   Date:   10/23/2023 MRN:  [id number] DOB: [birth year]    This 57 y.o. 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  REFERRING PROVIDER: [name], DO  HPI: The patient, a 57 year old with a history of stage 1A, ER, PR positive, invasive mammary carcinoma of the left breast, presents for a routine follow-up. She completed whole breast radiation seven months ago and has been on Arimidex since, which she tolerates well without significant side effects. She had a mammogram in August, which was benign (BI-RADS 2).  The patient reports a persistent bruise on the breast from surgery, which has not resolved. She expresses concern about this, but is otherwise doing well with no new or worsening symptoms. She is scheduled to see the surgeon for a follow-up in January..  COMPLICATIONS OF TREATMENT: none  FOLLOW UP COMPLIANCE: keeps appointments   PHYSICAL EXAM:  BP 100/80   Pulse 76   Resp 16   Ht 5\' 2"  (1.575 m)   Wt 164 lb 9.6 oz (74.7 kg)   BMI 30.11 kg/m  Lungs are clear to A&P cardiac examination essentially unremarkable with regular rate and rhythm. No dominant mass or nodularity is noted in either breast in 2 positions examined. Incision is well-healed. No axillary or supraclavicular adenopathy is appreciated. Cosmetic result is excellent.  There is continued ecchymosis around her lumpectomy site.  No mass or nodularity is appreciated.  Well-developed well-nourished patient in NAD. HEENT reveals PERLA, EOMI, discs not visualized.  Oral cavity is clear. No oral mucosal lesions are identified. Neck is clear without evidence of cervical or supraclavicular adenopathy. Lungs are clear to A&P. Cardiac examination is essentially unremarkable with regular rate and rhythm without murmur rub or thrill.  Abdomen is benign with no organomegaly or masses noted. Motor sensory and DTR levels are equal and symmetric in the upper and lower extremities. Cranial nerves II through XII are grossly intact. Proprioception is intact. No peripheral adenopathy or edema is identified. No motor or sensory levels are noted. Crude visual fields are within normal range.  RADIOLOGY RESULTS: Mammogram from August reviewed compatible with above-stated findings  PLAN: Stage 1A ER/PR positive Invasive Mammary Carcinoma Completed whole breast radiation 7 months ago. Currently on Arimidex without significant side effects. Mammogram in August was BI-RADS 2 benign. Persistent bruise from surgery, likely due to dye injection for sentinel nodes. -Continue Arimidex. -Follow-up with surgeon on January 9th to evaluate persistent bruise. -Return for follow-up in 6 months.    Carmina Miller, MD

## 2023-10-23 NOTE — Telephone Encounter (Signed)
Patient returned call and she states she has this reocurring boil issue that she gets she was supposed to be getting an antibiotic from most recent visit 10/20/23 please send doxycycline abx to [facility], she last had this issue on 08/03/23 , please advise

## 2023-10-23 NOTE — Telephone Encounter (Signed)
Reason for CRM: Pt says she was seen last Friday and was supposed to have a new prescription called in for an antibiotic but she called the pharmacy and they said it hasn't been called in     Called and left a message for a return call for details of reason for need of antibiotic.

## 2023-10-24 ENCOUNTER — Other Ambulatory Visit: Payer: Self-pay

## 2023-10-24 NOTE — Telephone Encounter (Signed)
Patient has been informed per drs orders.  ?

## 2023-10-25 ENCOUNTER — Encounter: Payer: Self-pay | Admitting: Nurse Practitioner

## 2023-10-25 NOTE — Telephone Encounter (Signed)
Mychart message sent.

## 2023-10-26 ENCOUNTER — Other Ambulatory Visit: Payer: Self-pay

## 2023-11-01 ENCOUNTER — Other Ambulatory Visit: Payer: Self-pay

## 2023-11-01 ENCOUNTER — Other Ambulatory Visit: Payer: Self-pay | Admitting: *Deleted

## 2023-11-01 ENCOUNTER — Telehealth: Payer: Self-pay | Admitting: *Deleted

## 2023-11-01 MED ORDER — EXEMESTANE 25 MG PO TABS
25.0000 mg | ORAL_TABLET | Freq: Every day | ORAL | 3 refills | Status: DC
  Filled 2023-11-01: qty 30, 30d supply, fill #0
  Filled 2023-12-04: qty 30, 30d supply, fill #1
  Filled 2023-12-24: qty 30, 30d supply, fill #2
  Filled 2024-02-03: qty 30, 30d supply, fill #3

## 2023-11-01 NOTE — Telephone Encounter (Signed)
I called pt back and she thought that when she was on zometa and exemestane and she thought it had bad side effects , then she changed to anastrozole and she hurting in joints really bad and thumb and SOB. She wants to go back to exemestane . She says when she was on just the exemestane  she has a tingle on her hand. I spoke to Smith Robert and it is ok to change back to exemestane again. I have let pt know about this also. And if she has a lot of issues to call us. Pt agreeable. Cancel the arimidex

## 2023-11-01 NOTE — Progress Notes (Signed)
Pt thinks that the med side effects is worse and we are oing back to exemestane

## 2023-11-08 DIAGNOSIS — F4323 Adjustment disorder with mixed anxiety and depressed mood: Secondary | ICD-10-CM | POA: Diagnosis not present

## 2023-11-13 DIAGNOSIS — R350 Frequency of micturition: Secondary | ICD-10-CM | POA: Diagnosis not present

## 2023-11-13 DIAGNOSIS — M064 Inflammatory polyarthropathy: Secondary | ICD-10-CM | POA: Diagnosis not present

## 2023-11-13 DIAGNOSIS — R300 Dysuria: Secondary | ICD-10-CM | POA: Diagnosis not present

## 2023-11-13 DIAGNOSIS — R21 Rash and other nonspecific skin eruption: Secondary | ICD-10-CM | POA: Diagnosis not present

## 2023-11-14 ENCOUNTER — Encounter: Payer: Self-pay | Admitting: Physician Assistant

## 2023-11-14 ENCOUNTER — Encounter: Payer: Self-pay | Admitting: Nurse Practitioner

## 2023-11-14 ENCOUNTER — Ambulatory Visit: Payer: Commercial Managed Care - PPO | Admitting: Physician Assistant

## 2023-11-14 VITALS — BP 109/71 | HR 77 | Temp 97.9°F | Ht 62.0 in | Wt 166.0 lb

## 2023-11-14 DIAGNOSIS — R300 Dysuria: Secondary | ICD-10-CM | POA: Diagnosis not present

## 2023-11-14 DIAGNOSIS — R350 Frequency of micturition: Secondary | ICD-10-CM | POA: Diagnosis not present

## 2023-11-14 LAB — POCT URINALYSIS DIP (CLINITEK)
Bilirubin, UA: NEGATIVE
Blood, UA: NEGATIVE
Glucose, UA: NEGATIVE mg/dL
Ketones, POC UA: NEGATIVE mg/dL
Leukocytes, UA: NEGATIVE
Nitrite, UA: NEGATIVE
POC PROTEIN,UA: NEGATIVE
Spec Grav, UA: 1.01 (ref 1.010–1.025)
Urobilinogen, UA: 0.2 U/dL
pH, UA: 5 (ref 5.0–8.0)

## 2023-11-14 NOTE — Addendum Note (Signed)
Addended by: Alm Bustard R on: 11/14/2023 04:36 PM   Modules accepted: Orders

## 2023-11-14 NOTE — Assessment & Plan Note (Signed)
Urine dipstick was clear in office today. Urine sent for culture. Patient understands lack of evidence for antibiotic therapy at this time. Will wait for culture results for further treatment. She was advised she could try Azo OTC for relief. At this time no concern for stone due to limited pain and negative blood in urine. Patient to let us know if pain worsens, would consider scan at that time.

## 2023-11-14 NOTE — Telephone Encounter (Signed)
Patient scheduled appointment this afternoon with [facility]

## 2023-11-14 NOTE — Progress Notes (Signed)
   Acute Office Visit  Subjective:     Patient ID: Nichole Brown, female    DOB: Aug 16, 1966, 57 y.o.   MRN: [id number]   HPI Patient presents today with complaint of frequent urination. She states she feels like she "can't hold it", and reports using the bathroom approximately every 20 minutes or so, but only voiding small amounts. She admits to pain on her left lower side/left suprapubic region. She denies fever, pain with urination, and odor. She reports no history of UTI or similar symptoms.  Review of Systems  Genitourinary:  Positive for frequency.  All other systems reviewed and are negative.       Objective:     BP 109/71   Pulse 77   Temp 97.9 F (36.6 C)   Ht 5\' 2"  (1.575 m)   Wt 166 lb (75.3 kg)   SpO2 99%   BMI 30.36 kg/m   Physical Exam Constitutional:      Appearance: Normal appearance.  Eyes:     Extraocular Movements: Extraocular movements intact.  Cardiovascular:     Rate and Rhythm: Normal rate and regular rhythm.  Pulmonary:     Effort: Pulmonary effort is normal.     Breath sounds: Normal breath sounds.  Abdominal:     General: Abdomen is flat.     Palpations: Abdomen is soft.     Tenderness: There is abdominal tenderness (mildly tender to deep palpation) in the suprapubic area and left lower quadrant. There is no right CVA tenderness or left CVA tenderness.  Neurological:     General: No focal deficit present.     Mental Status: Mental status is at baseline.  Psychiatric:        Mood and Affect: Mood normal.        Behavior: Behavior normal.        Thought Content: Thought content normal.        Judgment: Judgment normal.     Results for orders placed or performed in visit on 11/14/23  POCT URINALYSIS DIP (CLINITEK)  Result Value Ref Range   Color, UA yellow yellow   Clarity, UA clear clear   Glucose, UA negative negative mg/dL   Bilirubin, UA negative negative   Ketones, POC UA negative negative mg/dL   Spec Grav, UA 0.272 5.366 -  1.025   Blood, UA negative negative   pH, UA 5.0 5.0 - 8.0   POC PROTEIN,UA negative negative, trace   Urobilinogen, UA 0.2 0.2 or 1.0 E.U./dL   Nitrite, UA Negative Negative   Leukocytes, UA Negative Negative        Assessment & Plan:  Frequent urination Assessment & Plan: Urine dipstick was clear in office today. Urine sent for culture. Patient understands lack of evidence for antibiotic therapy at this time. Will wait for culture results for further treatment. She was advised she could try Azo OTC for relief. At this time no concern for stone due to limited pain and negative blood in urine. Patient to let us know if pain worsens, would consider scan at that time.   Orders: -     POCT URINALYSIS DIP (CLINITEK)  Dysuria -     POCT URINALYSIS DIP (CLINITEK) -     Urine Culture; Future     Return if symptoms worsen or fail to improve.  Toni Amend Lealon Vanputten, PA-C

## 2023-11-16 LAB — URINE CULTURE

## 2023-11-17 ENCOUNTER — Other Ambulatory Visit: Payer: Self-pay

## 2023-11-17 MED ORDER — SULFASALAZINE 500 MG PO TBEC
1000.0000 mg | DELAYED_RELEASE_TABLET | Freq: Two times a day (BID) | ORAL | 2 refills | Status: DC
  Filled 2023-11-17: qty 120, 30d supply, fill #0
  Filled 2023-12-24: qty 120, 30d supply, fill #1

## 2023-11-20 DIAGNOSIS — F4323 Adjustment disorder with mixed anxiety and depressed mood: Secondary | ICD-10-CM | POA: Diagnosis not present

## 2023-11-27 DIAGNOSIS — F4323 Adjustment disorder with mixed anxiety and depressed mood: Secondary | ICD-10-CM | POA: Diagnosis not present

## 2023-11-29 ENCOUNTER — Other Ambulatory Visit: Payer: Self-pay

## 2023-12-01 ENCOUNTER — Ambulatory Visit: Payer: Commercial Managed Care - PPO | Admitting: Nurse Practitioner

## 2023-12-04 ENCOUNTER — Other Ambulatory Visit: Payer: Self-pay

## 2023-12-04 DIAGNOSIS — F1120 Opioid dependence, uncomplicated: Secondary | ICD-10-CM | POA: Diagnosis not present

## 2023-12-04 DIAGNOSIS — F4311 Post-traumatic stress disorder, acute: Secondary | ICD-10-CM | POA: Diagnosis not present

## 2023-12-07 DIAGNOSIS — F4323 Adjustment disorder with mixed anxiety and depressed mood: Secondary | ICD-10-CM | POA: Diagnosis not present

## 2023-12-08 ENCOUNTER — Other Ambulatory Visit: Payer: Self-pay

## 2023-12-08 DIAGNOSIS — M65312 Trigger thumb, left thumb: Secondary | ICD-10-CM | POA: Diagnosis not present

## 2023-12-12 ENCOUNTER — Other Ambulatory Visit: Payer: Self-pay

## 2023-12-14 ENCOUNTER — Encounter: Payer: Self-pay | Admitting: Oncology

## 2023-12-14 DIAGNOSIS — F4311 Post-traumatic stress disorder, acute: Secondary | ICD-10-CM | POA: Diagnosis not present

## 2023-12-18 ENCOUNTER — Inpatient Hospital Stay: Payer: Commercial Managed Care - PPO | Attending: Oncology | Admitting: *Deleted

## 2023-12-18 DIAGNOSIS — Z9189 Other specified personal risk factors, not elsewhere classified: Secondary | ICD-10-CM

## 2023-12-18 DIAGNOSIS — Z79811 Long term (current) use of aromatase inhibitors: Secondary | ICD-10-CM | POA: Insufficient documentation

## 2023-12-18 DIAGNOSIS — Z8701 Personal history of pneumonia (recurrent): Secondary | ICD-10-CM | POA: Insufficient documentation

## 2023-12-18 DIAGNOSIS — K219 Gastro-esophageal reflux disease without esophagitis: Secondary | ICD-10-CM | POA: Insufficient documentation

## 2023-12-18 DIAGNOSIS — Z800 Family history of malignant neoplasm of digestive organs: Secondary | ICD-10-CM | POA: Insufficient documentation

## 2023-12-18 DIAGNOSIS — G629 Polyneuropathy, unspecified: Secondary | ICD-10-CM | POA: Insufficient documentation

## 2023-12-18 DIAGNOSIS — Z170 Estrogen receptor positive status [ER+]: Secondary | ICD-10-CM | POA: Insufficient documentation

## 2023-12-18 DIAGNOSIS — Z79899 Other long term (current) drug therapy: Secondary | ICD-10-CM | POA: Insufficient documentation

## 2023-12-18 DIAGNOSIS — Z79624 Long term (current) use of inhibitors of nucleotide synthesis: Secondary | ICD-10-CM | POA: Insufficient documentation

## 2023-12-18 DIAGNOSIS — Z923 Personal history of irradiation: Secondary | ICD-10-CM | POA: Insufficient documentation

## 2023-12-18 DIAGNOSIS — G4700 Insomnia, unspecified: Secondary | ICD-10-CM | POA: Insufficient documentation

## 2023-12-18 DIAGNOSIS — F4311 Post-traumatic stress disorder, acute: Secondary | ICD-10-CM | POA: Diagnosis not present

## 2023-12-18 DIAGNOSIS — C50412 Malignant neoplasm of upper-outer quadrant of left female breast: Secondary | ICD-10-CM | POA: Insufficient documentation

## 2023-12-18 DIAGNOSIS — Z803 Family history of malignant neoplasm of breast: Secondary | ICD-10-CM | POA: Insufficient documentation

## 2023-12-18 NOTE — Progress Notes (Signed)
 SUBJECTIVE: Pt returns for her 3 month L-Dex screen.    PAIN:  Are you having pain? No   SOZO SCREENING: Patient was assessed today using the SOZO machine to determine the lymphedema index score. This was compared to her baseline score. It was determined that she is within the recommended range when compared to her baseline and no further action is needed at this time. She will continue SOZO screenings. These are done every 3 months for 2 years post operatively followed by every 6 months for 2 years, and then annually.     L-DEX FLOWSHEETS                L-DEX LYMPHEDEMA SCREENING    Measurement Type Unilateral     L-DEX MEASUREMENT EXTREMITY Upper Extremity     POSITION  Standing     DOMINANT SIDE Right     At Risk Side Left     BASELINE SCORE (UNILATERAL) -3.9     L-DEX SCORE (UNILATERAL) -5.0    VALUE CHANGE (UNILAT) -1.2

## 2023-12-21 DIAGNOSIS — C50412 Malignant neoplasm of upper-outer quadrant of left female breast: Secondary | ICD-10-CM | POA: Diagnosis not present

## 2023-12-21 DIAGNOSIS — Z170 Estrogen receptor positive status [ER+]: Secondary | ICD-10-CM | POA: Diagnosis not present

## 2023-12-24 ENCOUNTER — Other Ambulatory Visit: Payer: Self-pay

## 2023-12-25 ENCOUNTER — Other Ambulatory Visit: Payer: Self-pay

## 2023-12-25 ENCOUNTER — Ambulatory Visit: Payer: Commercial Managed Care - PPO

## 2023-12-26 ENCOUNTER — Other Ambulatory Visit: Payer: Self-pay

## 2023-12-28 ENCOUNTER — Other Ambulatory Visit: Payer: Self-pay

## 2024-01-01 DIAGNOSIS — F4311 Post-traumatic stress disorder, acute: Secondary | ICD-10-CM | POA: Diagnosis not present

## 2024-01-05 ENCOUNTER — Other Ambulatory Visit: Payer: Commercial Managed Care - PPO

## 2024-01-05 ENCOUNTER — Ambulatory Visit: Payer: Commercial Managed Care - PPO

## 2024-01-05 ENCOUNTER — Ambulatory Visit: Payer: Commercial Managed Care - PPO | Admitting: Oncology

## 2024-01-08 ENCOUNTER — Inpatient Hospital Stay: Payer: Commercial Managed Care - PPO

## 2024-01-08 ENCOUNTER — Inpatient Hospital Stay (HOSPITAL_BASED_OUTPATIENT_CLINIC_OR_DEPARTMENT_OTHER): Payer: Commercial Managed Care - PPO | Admitting: Oncology

## 2024-01-08 VITALS — BP 109/78 | HR 72 | Temp 98.7°F | Resp 18 | Wt 164.0 lb

## 2024-01-08 DIAGNOSIS — Z9189 Other specified personal risk factors, not elsewhere classified: Secondary | ICD-10-CM

## 2024-01-08 DIAGNOSIS — G4700 Insomnia, unspecified: Secondary | ICD-10-CM | POA: Diagnosis not present

## 2024-01-08 DIAGNOSIS — Z803 Family history of malignant neoplasm of breast: Secondary | ICD-10-CM | POA: Diagnosis not present

## 2024-01-08 DIAGNOSIS — C50412 Malignant neoplasm of upper-outer quadrant of left female breast: Secondary | ICD-10-CM

## 2024-01-08 DIAGNOSIS — Z170 Estrogen receptor positive status [ER+]: Secondary | ICD-10-CM

## 2024-01-08 DIAGNOSIS — Z79811 Long term (current) use of aromatase inhibitors: Secondary | ICD-10-CM | POA: Diagnosis not present

## 2024-01-08 DIAGNOSIS — Z79899 Other long term (current) drug therapy: Secondary | ICD-10-CM | POA: Diagnosis not present

## 2024-01-08 DIAGNOSIS — Z800 Family history of malignant neoplasm of digestive organs: Secondary | ICD-10-CM | POA: Diagnosis not present

## 2024-01-08 DIAGNOSIS — Z7983 Long term (current) use of bisphosphonates: Secondary | ICD-10-CM | POA: Diagnosis not present

## 2024-01-08 DIAGNOSIS — Z923 Personal history of irradiation: Secondary | ICD-10-CM | POA: Diagnosis not present

## 2024-01-08 DIAGNOSIS — Z79624 Long term (current) use of inhibitors of nucleotide synthesis: Secondary | ICD-10-CM | POA: Diagnosis not present

## 2024-01-08 DIAGNOSIS — Z08 Encounter for follow-up examination after completed treatment for malignant neoplasm: Secondary | ICD-10-CM

## 2024-01-08 DIAGNOSIS — G629 Polyneuropathy, unspecified: Secondary | ICD-10-CM | POA: Diagnosis not present

## 2024-01-08 DIAGNOSIS — Z5181 Encounter for therapeutic drug level monitoring: Secondary | ICD-10-CM

## 2024-01-08 DIAGNOSIS — C50912 Malignant neoplasm of unspecified site of left female breast: Secondary | ICD-10-CM | POA: Diagnosis present

## 2024-01-08 DIAGNOSIS — K219 Gastro-esophageal reflux disease without esophagitis: Secondary | ICD-10-CM | POA: Diagnosis not present

## 2024-01-08 DIAGNOSIS — Z8701 Personal history of pneumonia (recurrent): Secondary | ICD-10-CM | POA: Diagnosis not present

## 2024-01-08 LAB — CBC (CANCER CENTER ONLY)
HCT: 38.4 % (ref 36.0–46.0)
Hemoglobin: 12.8 g/dL (ref 12.0–15.0)
MCH: 30.1 pg (ref 26.0–34.0)
MCHC: 33.3 g/dL (ref 30.0–36.0)
MCV: 90.4 fL (ref 80.0–100.0)
Platelet Count: 230 10*3/uL (ref 150–400)
RBC: 4.25 MIL/uL (ref 3.87–5.11)
RDW: 13.7 % (ref 11.5–15.5)
WBC Count: 7.8 10*3/uL (ref 4.0–10.5)
nRBC: 0 % (ref 0.0–0.2)

## 2024-01-08 LAB — CMP (CANCER CENTER ONLY)
ALT: 20 U/L (ref 0–44)
AST: 15 U/L (ref 15–41)
Albumin: 4.1 g/dL (ref 3.5–5.0)
Alkaline Phosphatase: 48 U/L (ref 38–126)
Anion gap: 8 (ref 5–15)
BUN: 18 mg/dL (ref 6–20)
CO2: 24 mmol/L (ref 22–32)
Calcium: 8.9 mg/dL (ref 8.9–10.3)
Chloride: 108 mmol/L (ref 98–111)
Creatinine: 0.9 mg/dL (ref 0.44–1.00)
GFR, Estimated: >60 mL/min (ref >60)
Glucose, Bld: 99 mg/dL (ref 70–99)
Potassium: 4 mmol/L (ref 3.5–5.1)
Sodium: 140 mmol/L (ref 135–145)
Total Bilirubin: 0.4 mg/dL (ref 0.0–1.2)
Total Protein: 6.6 g/dL (ref 6.5–8.1)

## 2024-01-08 MED ORDER — SODIUM CHLORIDE 0.9 % IV SOLN
Freq: Once | INTRAVENOUS | Status: AC
  Filled 2024-01-08: qty 250

## 2024-01-08 MED ORDER — ZOLEDRONIC ACID 4 MG/100ML IV SOLN
4.0000 mg | INTRAVENOUS | Status: DC
  Administered 2024-01-08: 4 mg via INTRAVENOUS
  Filled 2024-01-08: qty 100

## 2024-01-09 ENCOUNTER — Encounter: Payer: Self-pay | Admitting: Oncology

## 2024-01-09 NOTE — Progress Notes (Signed)
Hematology/Oncology Consult note [facility]  [phone] [phone]  Patient Care Team: [name], DO as PCP - General (Family Medicine) West Bali, MD (Inactive) as Consulting Physician (Gastroenterology) Hulen Luster, RN as Oncology Nurse Navigator Carmina Miller, MD as Consulting Physician (Radiation Oncology) Creig Hines, MD as Consulting Physician (Oncology) Emelia Loron, MD as Consulting Physician (General Surgery)   Name of the patient: Nichole Brown  [id number]  Jul 19, 1966   Date of visit: 01/09/24  Diagnosis- pathological prognostic stage Ia invasive mammary carcinoma of the left breast pT1b N0 M0 ER/PR positive HER2 negative         Chief complaint/ Reason for visit-routine follow-up of breast cancer on exemestane and to receive Zometa  Heme/Onc history: Patient is a 58 year old female who underwent a routine bilateral screening mammogram in July 2023 which showed a possible asymmetry in her left breast.  This was followed by a diagnostic mammogram and ultrasound which showed multiple benign cysts noted at the 1 o'clock position.  At the 2 o'clock position 3 cm from the nipple was a oval hypoechoic mass measuring 5 x 4 x 3 mm.  No suspicious left axillary adenopathy.  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   Final pathology showed grade two 7 mm tumor with negative margins.  1 sentinel lymph node negative for malignancy.  Oncotype results showed a recurrence score of 28 and absolute benefit of chemotherapy at greater than 15%.   Patient completed 4 cycles of adjuvant TC chemotherapy in February 2024.  She also completed adjuvant radiation and started taking Arimidex in March 2024.  She subsequently switched to exemestane with better tolerance she is also on adjuvant Zometa.      Interval history-presently  she is tolerating exemestane well and denies any significant joint pains or other side effects.  Neuropathy is overall stable.  Denies any breast concerns today  ECOG PS- 0 Pain scale- 0   Review of systems- Review of Systems  Constitutional:  Negative for chills, fever, malaise/fatigue and weight loss.  HENT:  Negative for congestion, ear discharge and nosebleeds.   Eyes:  Negative for blurred vision.  Respiratory:  Negative for cough, hemoptysis, sputum production, shortness of breath and wheezing.   Cardiovascular:  Negative for chest pain, palpitations, orthopnea and claudication.  Gastrointestinal:  Negative for abdominal pain, blood in stool, constipation, diarrhea, heartburn, melena, nausea and vomiting.  Genitourinary:  Negative for dysuria, flank pain, frequency, hematuria and urgency.  Musculoskeletal:  Negative for back pain, joint pain and myalgias.  Skin:  Negative for rash.  Neurological:  Negative for dizziness, tingling, focal weakness, seizures, weakness and headaches.  Endo/Heme/Allergies:  Does not bruise/bleed easily.  Psychiatric/Behavioral:  Negative for depression and suicidal ideas. The patient does not have insomnia.       Allergies  Allergen Reactions   Cymbalta [Duloxetine Hcl] Other (See Comments)    Hallucinations; sleep walking   Nortriptyline Nausea And Vomiting   Phenergan [Promethazine Hcl]     Restless legs and cramping     Past Medical History:  Diagnosis Date   Anxiety    Bronchitis    Cancer (HCC) 09/2022   left breast IMC   Complication of anesthesia    PONV   Contusion of leg 10/06/2015   Depression    GERD (gastroesophageal reflux disease)    Insomnia  Migraine headache    Pneumonia    2000   PONV (postoperative nausea and vomiting)      Past Surgical History:  Procedure Laterality Date   ABDOMINAL HYSTERECTOMY     BALLOON DILATION N/A 12/14/2020   Procedure: BALLOON DILATION;  Surgeon: Lanelle Bal, DO;  [facility];  Service: Endoscopy;  Laterality: N/A;   BIOPSY  12/14/2020   Procedure: BIOPSY;  Surgeon: Lanelle Bal, DO;  Location: [facility];  Service: Endoscopy;;   BREAST BIOPSY Left 08/24/2022   [place] bx, coil marker +   BREAST LUMPECTOMY WITH RADIOACTIVE SEED AND SENTINEL LYMPH NODE BIOPSY Left 09/29/2022   Invasive ductal carcinoma, grade 2, pT1b, pN0  - Margins are negative for carcinoma in situ or invasive carcinoma   CESAREAN SECTION     CHOLECYSTECTOMY  1988   COLONOSCOPY N/A 12/10/2018    six 4-8 mm polyps in proximal descending colon, mid transverse and hepatic flexure. Diverticulosis. External and internal hemorrhoids. Torturous colon. Two simple adenomas, two serrated polyps, 2 hyperplastic polyps. Surveillance due Dec 2022.    ESOPHAGOGASTRODUODENOSCOPY (EGD) WITH PROPOFOL N/A 12/14/2020   Benign-appearing esophageal stenosis s/p dilation, gastritis, normal duodenum. Mild chronic gastritis.    open heart surgery  1967   PATENT DUCTUS ARTERIOUS REPAIR  1967   POLYPECTOMY  12/10/2018   Procedure: POLYPECTOMY;  Surgeon: West Bali, MD;  Location: [facility];  Service: Endoscopy;;  hepatic flexure, descending, transverse   PORT-A-CATH REMOVAL Right 03/14/2023   Procedure: REMOVAL PORT-A-CATH;  Surgeon: Emelia Loron, MD;  Location: [facility];  Service: General;  Laterality: Right;   PORTACATH PLACEMENT N/A 11/14/2022   Procedure: PORT PLACEMENT WITH ULTRASOUND GUIDANCE;  Surgeon: Emelia Loron, MD;  Location: [facility];  Service: General;  Laterality: N/A;   SHOULDER ARTHROSCOPY Left 06/16/2017   Procedure: LEFT SHOULDER ARTHROSCOPY, DEBRIDEMENT, AND DECOMPRESSION;  Surgeon: Nadara Mustard, MD;  Location: [facility];  Service: Orthopedics;  Laterality: Left;   SHOULDER ARTHROSCOPY WITH SUBACROMIAL DECOMPRESSION Left 10/03/2019   Procedure: SHOULDER ARTHROSCOPY WITH DEBRIDEMENT, DECOMPRESSION;  Surgeon: Christena Flake, MD;   Location: [facility];  Service: Orthopedics;  Laterality: Left;   SHOULDER SURGERY Left 2008   [facility]    Social History   Socioeconomic History   Marital status: Married    Spouse name: daniel   Number of children: 4   Years of education: Not on file   Highest education level: Associate degree: occupational, Scientist, product/process development, or vocational program  Occupational History   Not on file  Tobacco Use   Smoking status: Never   Smokeless tobacco: Never  Vaping Use   Vaping status: Never Used  Substance and Sexual Activity   Alcohol use: Not Currently    Comment: occassionally-not since chemo started   Drug use: No   Sexual activity: Yes    Birth control/protection: Surgical    Comment: hyst  Other Topics Concern   Not on file  Social History Narrative   Not on file   Social Drivers of Health   Financial Resource Strain: Low Risk  (10/20/2023)   Overall Financial Resource Strain (CARDIA)    Difficulty of Paying Living Expenses: Not hard at all  Food Insecurity: No Food Insecurity (10/20/2023)   Hunger Vital Sign    Worried About Running Out of Food in the Last Year: Never true    Ran Out of Food in the Last Year: Never true  Transportation Needs: No Transportation  Needs (10/20/2023)   PRAPARE - Administrator, Civil Service (Medical): No    Lack of Transportation (Non-Medical): No  Physical Activity: Sufficiently Active (10/20/2023)   Exercise Vital Sign    Days of Exercise per Week: 7 days    Minutes of Exercise per Session: 30 min  Stress: Stress Concern Present (10/20/2023)   [employer] of Occupational Health - Occupational Stress Questionnaire    Feeling of Stress : To some extent  Social Connections: Socially Integrated (10/20/2023)   Social Connection and Isolation Panel [NHANES]    Frequency of Communication with Friends and Family: Twice a week    Frequency of Social Gatherings with Friends and Family: Three times a week    Attends Religious Services: More  than 4 times per year    Active Member of Clubs or Organizations: Yes    Attends Banker Meetings: More than 4 times per year    Marital Status: Married  Catering manager Violence: Not At Risk (07/12/2022)   Humiliation, Afraid, Rape, and Kick questionnaire    Fear of Current or Ex-Partner: No    Emotionally Abused: No    Physically Abused: No    Sexually Abused: No    Family History  Problem Relation Age of Onset   Breast cancer Mother 11   Hypertension Father    Colon cancer Paternal Uncle        dx 54s   Hypertension Maternal Grandmother    Diabetes Maternal Grandmother    Heart disease Maternal Grandmother 40       MI   Hypertension Maternal Grandfather    Throat cancer Maternal Grandfather 50     Current Outpatient Medications:    doxycycline (VIBRA-TABS) 100 MG tablet, Take 1 tablet (100 mg total) by mouth 2 (two) times daily. (Patient not taking: Reported on 11/14/2023), Disp: 14 tablet, Rfl: 0   acetaminophen (TYLENOL) 500 MG tablet, Take 500 mg by mouth every 6 (six) hours as needed for moderate pain., Disp: , Rfl:    augmented betamethasone dipropionate (DIPROLENE-AF) 0.05 % cream, Apply topically 2 (two) times daily as needed for rash, Disp: 30 g, Rfl: 0   CALCIUM PO, Take 1 capsule by mouth daily., Disp: , Rfl:    exemestane (AROMASIN) 25 MG tablet, Take 1 tablet (25 mg total) by mouth daily after breakfast., Disp: 30 tablet, Rfl: 3   Lifitegrast (XIIDRA) 5 % SOLN, Instill 1 drop into both eyes twice a day, Disp: 180 each, Rfl: 4   omega-3 acid ethyl esters (LOVAZA) 1 g capsule, Take by mouth 2 (two) times daily., Disp: , Rfl:    ondansetron (ZOFRAN-ODT) 4 MG disintegrating tablet, Take 1 tablet (4 mg total) by mouth every 8 (eight) hours as needed for Nausea migraine, Disp: 20 tablet, Rfl: 11   Probiotic Product (PROBIOTIC PO), Take 1 capsule by mouth daily., Disp: , Rfl:    rizatriptan (MAXALT-MLT) 10 MG disintegrating tablet, Take 1 tablet (10 mg total)  by mouth daily as needed (may repeat dose once in > 2 hrs) for up to 1 dose, Disp: 10 tablet, Rfl: 11   sulfaSALAzine (AZULFIDINE) 500 MG EC tablet, Take 2 tablets (1,000 mg total) by mouth 2 (two) times daily. (Patient not taking: Reported on 01/08/2024), Disp: 120 tablet, Rfl: 2   tiZANidine (ZANAFLEX) 4 MG capsule, Take 1 capsule (4 mg total) by mouth 3 (three) times daily., Disp: 90 capsule, Rfl: 3   topiramate (TOPAMAX) 25 MG tablet, Take 1 tablet (  25 mg total) by mouth 2 (two) times daily, Disp: 60 tablet, Rfl: 5   traZODone (DESYREL) 50 MG tablet, Take 1 tablet (50 mg total) by mouth at bedtime as needed for sleep., Disp: 90 tablet, Rfl: 1   valACYclovir (VALTREX) 500 MG tablet, Take 1 tablet (500 mg total) by mouth daily for suppression. Take 2 tablets daily (1000 mg) by mouth daily for 5 days for acute outbreak. (Patient not taking: Reported on 11/14/2023), Disp: 90 tablet, Rfl: 1  Physical exam:  Vitals:   01/08/24 1314  BP: 109/78  Pulse: 72  Resp: 18  Temp: 98.7 F (37.1 C)  TempSrc: Tympanic  SpO2: 99%  Weight: 164 lb (74.4 kg)   Physical Exam Cardiovascular:     Rate and Rhythm: Normal rate and regular rhythm.     Heart sounds: Normal heart sounds.  Pulmonary:     Effort: Pulmonary effort is normal.     Breath sounds: Normal breath sounds.  Skin:    General: Skin is warm and dry.  Neurological:     Mental Status: She is alert and oriented to person, place, and time.         Latest Ref Rng & Units 01/08/2024   12:45 PM  CMP  Glucose 70 - 99 mg/dL 99   BUN 6 - 20 mg/dL 18   Creatinine 9.52 - 1.00 mg/dL 8.41   Sodium 324 - 401 mmol/L 140   Potassium 3.5 - 5.1 mmol/L 4.0   Chloride 98 - 111 mmol/L 108   CO2 22 - 32 mmol/L 24   Calcium 8.9 - 10.3 mg/dL 8.9   Total Protein 6.5 - 8.1 g/dL 6.6   Total Bilirubin 0.0 - 1.2 mg/dL 0.4   Alkaline Phos 38 - 126 U/L 48   AST 15 - 41 U/L 15   ALT 0 - 44 U/L 20       Latest Ref Rng & Units 01/08/2024   12:51 PM  CBC   WBC 4.0 - 10.5 K/uL 7.8   Hemoglobin 12.0 - 15.0 g/dL 02.7   Hematocrit 25.3 - 46.0 % 38.4   Platelets 150 - 400 K/uL 230     No images are attached to the encounter.  No results found.   Assessment and plan- Patient is a 58 y.o. female with history of stage I left breast cancer ER/PR positive HER2 negative s/p adjuvant chemotherapy and radiation.  She is currently on exemestane and this is a routine follow-up visit and also here to receive Zometa  Calcium levels acceptable to proceed with Zometa dose 4 out of 6 today.  Clinically she is doing well with no concerning signs and symptoms of recurrence based on today's exam.  She is tolerating exemestane better than Arimidex and will continue to take this along with calcium and vitamin D.  I will see her back in 3 months   Visit Diagnosis 1. Use of exemestane (Aromasin)   2. Malignant neoplasm of upper-outer quadrant of left breast in female, estrogen receptor positive (HCC)   3. High risk medication use   4. Encounter for monitoring zoledronate therapy      Dr. Owens Shark, MD, MPH [facility] [phone] 01/09/2024 6:09 PM

## 2024-01-12 NOTE — Progress Notes (Unsigned)
Referring Physician:  [name], DO 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  She has been followed by orthopedics who have started a peroneal neuropathy/neuritis workup.  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  She feels significant right lower extremity pain and tingling in a peroneal nerve distribution starting at her knee and radiating to the top of her foot.  She also gets this on her left side however is not quite as severe.  She feels worse when she is walking or driving or in certain positions.  Resting sometimes helps.  She does not use any cigarettes.  She does feel this is progressively worsening.  She has previously tried neuropathic pain medications including Cymbalta and nortriptyline.  Review of Systems:  A 10 point review of systems is negative, except for the pertinent positives and negatives detailed in the HPI.  Past Medical History: Past Medical History:  Diagnosis Date   Anxiety    Bronchitis    Cancer (HCC) 09/2022   left breast IMC   Complication of anesthesia    PONV   Contusion of leg 10/06/2015   Depression    GERD (gastroesophageal reflux disease)    Insomnia    Migraine headache    Pneumonia    2000   PONV (postoperative nausea and vomiting)     Past Surgical History: Past Surgical History:  Procedure Laterality Date   ABDOMINAL HYSTERECTOMY     BALLOON DILATION N/A 12/14/2020   Procedure: BALLOON DILATION;  Surgeon: Lanelle Bal, DO;  [facility];  Service: Endoscopy;  Laterality: N/A;   BIOPSY  12/14/2020   Procedure: BIOPSY;  Surgeon: Lanelle Bal, DO;  [facility];  Service:  Endoscopy;;   BREAST BIOPSY Left 08/24/2022   [place] bx, coil marker +   BREAST LUMPECTOMY WITH RADIOACTIVE SEED AND SENTINEL LYMPH NODE BIOPSY Left 09/29/2022   Invasive ductal carcinoma, grade 2, pT1b, pN0  - Margins are negative for carcinoma in situ or invasive carcinoma   CESAREAN SECTION     CHOLECYSTECTOMY  1988   COLONOSCOPY N/A 12/10/2018    six 4-8 mm polyps in proximal descending colon, mid transverse and hepatic flexure. Diverticulosis. External and internal hemorrhoids. Torturous colon. Two simple adenomas, two serrated polyps, 2 hyperplastic polyps. Surveillance due Dec 2022.    ESOPHAGOGASTRODUODENOSCOPY (EGD) WITH PROPOFOL N/A 12/14/2020   Benign-appearing esophageal stenosis s/p dilation, gastritis, normal duodenum. Mild chronic gastritis.    open heart surgery  1967   PATENT DUCTUS ARTERIOUS REPAIR  1967   POLYPECTOMY  12/10/2018   Procedure: POLYPECTOMY;  Surgeon: West Bali, MD;  Location: [facility];  Service: Endoscopy;;  hepatic flexure, descending, transverse   PORT-A-CATH REMOVAL Right 03/14/2023   Procedure: REMOVAL PORT-A-CATH;  Surgeon: Emelia Loron, MD;  Location: [facility];  Service: General;  Laterality: Right;   PORTACATH PLACEMENT N/A 11/14/2022   Procedure: PORT PLACEMENT WITH ULTRASOUND GUIDANCE;  Surgeon: Emelia Loron, MD;  Location: [facility];  Service: General;  Laterality: N/A;   SHOULDER ARTHROSCOPY Left 06/16/2017   Procedure: LEFT SHOULDER ARTHROSCOPY, DEBRIDEMENT, AND DECOMPRESSION;  Surgeon: Nadara Mustard, MD;  Location: [facility];  Service: Orthopedics;  Laterality: Left;   SHOULDER ARTHROSCOPY WITH SUBACROMIAL DECOMPRESSION Left 10/03/2019   Procedure: SHOULDER ARTHROSCOPY WITH DEBRIDEMENT, DECOMPRESSION;  Surgeon: Christena Flake, MD;  Location: [facility];  Service: Orthopedics;  Laterality: Left;   SHOULDER SURGERY Left 2008   [facility] joint    Allergies: Allergies as of 01/15/2024 - Review Complete  01/15/2024  Allergen Reaction Noted   Cymbalta [duloxetine hcl] Other (See Comments) 02/09/2017   Nortriptyline Nausea And Vomiting 02/09/2017   Phenergan [promethazine hcl]  12/10/2018    Medications:  Current Outpatient Medications:    acetaminophen (TYLENOL) 500 MG tablet, Take 500 mg by mouth every 6 (six) hours as needed for moderate pain., Disp: , Rfl:    augmented betamethasone dipropionate (DIPROLENE-AF) 0.05 % cream, Apply topically 2 (two) times daily as needed for rash, Disp: 30 g, Rfl: 0   CALCIUM PO, Take 1 capsule by mouth daily., Disp: , Rfl:    exemestane (AROMASIN) 25 MG tablet, Take 1 tablet (25 mg total) by mouth daily after breakfast., Disp: 30 tablet, Rfl: 3   Lifitegrast (XIIDRA) 5 % SOLN, Instill 1 drop into both eyes twice a day, Disp: 180 each, Rfl: 4   omega-3 acid ethyl esters (LOVAZA) 1 g capsule, Take by mouth 2 (two) times daily., Disp: , Rfl:    ondansetron (ZOFRAN-ODT) 4 MG disintegrating tablet, Take 1 tablet (4 mg total) by mouth every 8 (eight) hours as needed for Nausea migraine, Disp: 20 tablet, Rfl: 11   Probiotic Product (PROBIOTIC PO), Take 1 capsule by mouth daily., Disp: , Rfl:    rizatriptan (MAXALT-MLT) 10 MG disintegrating tablet, Take 1 tablet (10 mg total) by mouth once as needed (may repeat dose once in > 2 hrs) for up to 1 dose, Disp: 10 tablet, Rfl: 11   tiZANidine (ZANAFLEX) 4 MG capsule, Take 1 capsule (4 mg total) by mouth 3 (three) times daily., Disp: 90 capsule, Rfl: 3   topiramate (TOPAMAX) 25 MG tablet, Take 1 tablet (25 mg total) by mouth 2 (two) times daily., Disp: 60 tablet, Rfl: 11   valACYclovir (VALTREX) 500 MG tablet, Take 1 tablet (500 mg total) by mouth daily for suppression. Take 2 tablets daily (1000 mg) by mouth daily for 5 days for acute outbreak., Disp: 90 tablet, Rfl: 1   traZODone (DESYREL) 50 MG tablet, Take 1 tablet (50 mg total) by mouth at bedtime as needed for sleep. (Patient not taking: Reported on 01/15/2024), Disp:  90 tablet, Rfl: 1  Social History: Social History   Tobacco Use   Smoking status: Never   Smokeless tobacco: Never  Vaping Use   Vaping status: Never Used  Substance Use Topics   Alcohol use: Not Currently    Comment: occassionally-not since chemo started   Drug use: No    Family Medical History: Family History  Problem Relation Age of Onset   Breast cancer Mother 60   Hypertension Father    Colon cancer Paternal Uncle        dx 60s   Hypertension Maternal Grandmother    Diabetes Maternal Grandmother    Heart disease Maternal Grandmother 38       MI   Hypertension Maternal Grandfather    Throat cancer Maternal Grandfather 35    Physical Examination: Vitals:   01/15/24 1418  BP: 108/68    General: Patient is in no apparent distress. Attention to examination is appropriate.  Neck:   Supple.  Full range of motion.  Respiratory: Patient is breathing  without any difficulty.   NEUROLOGICAL:     Awake, alert, oriented to person, place, and time.  Speech is clear and fluent.   Cranial Nerves: Pupils equal round and reactive to light.  Facial tone is symmetric. Shoulder shrug is symmetric. Tongue protrusion is midline.  There is no pronator drift.  Motor Exam:  On physical examination she does not demonstrate any clear motor deficits.  She has good excursion of her dorsiflexion and EHL function.  No obvious deficit in plantarflexion eversion or inversion.  Reflexes are trace throughout the bilateral lower extremities  Decreased predominantly in the superficial distribution, however appears to have some decrease in her lower extremity distal to her mid calf  No evidence of steppage gait  She does have a significant Tinel sign at the peroneal exit and the lateral fascia near the fibular shaft.  She also has a significant Tinel at the posterior fibula near the knee.   Medical Decision Making  Imaging: I also reviewed her MRI of her lumbar spine from a few years  back, did not demonstrate any significant stenosis or compression of any exiting or traversing nerve roots which could be related to her symptomatology and her presentation.  Electrodiagnostics: I reviewed her outside EMG which demonstrated a sensory neuropathy but no evidence of clear mononeuropathy or radiculopathy.  This was done approximately 6 months ago.  There is no ultrasound component.  I have personally reviewed the images and electrodiagnostics and agree with the above interpretation.  Assessment and Plan: Nichole Brown is a pleasant 58 y.o. female with pain mostly in the bilateral right worse than left peroneal nerve distribution.  She states this has been ongoing and she has had significant workup for this.  Her risk factors include history of breast cancer with chemotherapy and chemotherapy related neuropathy.  She states that when she walks or stands for a significant period of time she gets worsening pain in the lateral aspect of her lower EXTR distal lower extremity that radiates to the top of her foot.  On physical examination she does not have a foot drop, however she does have a significant Tinel at the posterior fibular head as well as the fibular shaft at the exit site of the peroneal nerve bilaterally.  The right side is worse than the left.  She does not have a positive straight leg raise.  There is no evidence of wasting.  She does have some back pain that is midline low back and has not been clearly diagnosed as any lumbar sacral spondylosis.  Given her symptoms are consistent with possible peroneal neuritis or neuropathy would like to have her have a repeat EMG/nerve conduction study as she is having worsening symptoms.  She has not yet had a peroneal ultrasound so we will ask for that as well.  A referral has been made to our neurology colleagues for evaluation.  Should she have a peroneal neuropathy or peroneal neuritis with either neurodiagnostic evidence or anatomical  evidence of compression or swelling of the nerve she would likely benefit from an exploration decompression of her peroneal nerves bilaterally both at the fibular neck as well as the fibular shaft.  If this does not come back is a peroneal neuropathy and is more likely to be consistent with a chemotherapy-induced peripheral neuropathy then she may benefit from evaluation of a spinal cord stimulator.  She does have a component of midline low back pain and if her diagnosis becomes that this is all discomfort from a  chemotherapy induced peripheral neuropathy she would benefit from a trial stimulator and would make referral to our pain medicine team.  Thank you for involving me in the care of this patient.    Lovenia Kim MD/MSCR Neurosurgery - Peripheral Nerve Surgery

## 2024-01-15 ENCOUNTER — Ambulatory Visit: Payer: Commercial Managed Care - PPO | Admitting: Neurosurgery

## 2024-01-15 ENCOUNTER — Other Ambulatory Visit: Payer: Self-pay

## 2024-01-15 VITALS — BP 108/68 | Ht 62.0 in | Wt 168.0 lb

## 2024-01-15 DIAGNOSIS — G620 Drug-induced polyneuropathy: Secondary | ICD-10-CM | POA: Diagnosis not present

## 2024-01-15 DIAGNOSIS — M545 Low back pain, unspecified: Secondary | ICD-10-CM | POA: Diagnosis not present

## 2024-01-15 DIAGNOSIS — F4311 Post-traumatic stress disorder, acute: Secondary | ICD-10-CM | POA: Diagnosis not present

## 2024-01-15 DIAGNOSIS — G43709 Chronic migraine without aura, not intractable, without status migrainosus: Secondary | ICD-10-CM | POA: Diagnosis not present

## 2024-01-15 DIAGNOSIS — T451X5A Adverse effect of antineoplastic and immunosuppressive drugs, initial encounter: Secondary | ICD-10-CM | POA: Diagnosis not present

## 2024-01-15 DIAGNOSIS — G5732 Lesion of lateral popliteal nerve, left lower limb: Secondary | ICD-10-CM | POA: Insufficient documentation

## 2024-01-15 DIAGNOSIS — M542 Cervicalgia: Secondary | ICD-10-CM | POA: Diagnosis not present

## 2024-01-15 DIAGNOSIS — G5731 Lesion of lateral popliteal nerve, right lower limb: Secondary | ICD-10-CM | POA: Insufficient documentation

## 2024-01-15 DIAGNOSIS — G5733 Lesion of lateral popliteal nerve, bilateral lower limbs: Secondary | ICD-10-CM

## 2024-01-15 MED ORDER — RIZATRIPTAN BENZOATE 10 MG PO TBDP
10.0000 mg | ORAL_TABLET | Freq: Once | ORAL | 11 refills | Status: DC | PRN
  Filled 2024-01-15: qty 10, 30d supply, fill #0
  Filled 2024-05-15: qty 10, 30d supply, fill #1

## 2024-01-15 MED ORDER — ONDANSETRON 4 MG PO TBDP
4.0000 mg | ORAL_TABLET | Freq: Three times a day (TID) | ORAL | 11 refills | Status: AC | PRN
  Filled 2024-01-15: qty 10, 4d supply, fill #0
  Filled 2024-01-15: qty 10, 3d supply, fill #0
  Filled 2024-05-15: qty 20, 7d supply, fill #1

## 2024-01-15 MED ORDER — TOPIRAMATE 25 MG PO TABS
25.0000 mg | ORAL_TABLET | Freq: Two times a day (BID) | ORAL | 11 refills | Status: DC
  Filled 2024-01-15 – 2024-02-07 (×2): qty 60, 30d supply, fill #0
  Filled 2024-03-08: qty 60, 30d supply, fill #1
  Filled 2024-04-09: qty 60, 30d supply, fill #2
  Filled 2024-05-15: qty 60, 30d supply, fill #3
  Filled 2024-06-10: qty 60, 30d supply, fill #4
  Filled 2024-07-15: qty 60, 30d supply, fill #5
  Filled 2024-10-20: qty 60, 30d supply, fill #6
  Filled 2025-01-02: qty 60, 30d supply, fill #7

## 2024-01-16 ENCOUNTER — Other Ambulatory Visit: Payer: Self-pay

## 2024-01-16 ENCOUNTER — Encounter: Payer: Self-pay | Admitting: Neurology

## 2024-01-16 DIAGNOSIS — R202 Paresthesia of skin: Secondary | ICD-10-CM

## 2024-01-17 ENCOUNTER — Other Ambulatory Visit: Payer: Self-pay

## 2024-01-17 DIAGNOSIS — R202 Paresthesia of skin: Secondary | ICD-10-CM

## 2024-01-22 ENCOUNTER — Encounter: Payer: Self-pay | Admitting: Oncology

## 2024-01-23 ENCOUNTER — Ambulatory Visit (INDEPENDENT_AMBULATORY_CARE_PROVIDER_SITE_OTHER): Payer: Commercial Managed Care - PPO | Admitting: Neurology

## 2024-01-23 ENCOUNTER — Ambulatory Visit: Payer: Commercial Managed Care - PPO | Admitting: Neurology

## 2024-01-23 DIAGNOSIS — R202 Paresthesia of skin: Secondary | ICD-10-CM | POA: Diagnosis not present

## 2024-01-23 NOTE — Procedures (Signed)
[facility]  [address] [place], [address]  [place], [place] [address] Tel: [phone] Fax: [phone] Test Date:  01/23/2024  Patient: Nichole Brown DOB: [birth year] Physician: Jacquelyne Balint, MD  Sex: Female Height: 5\' 2"  Ref Phys: Ernestine Mcmurray, MD  ID#: [id number]   Technician:    History: This is a 58 year old female with pain in bilateral lower limbs.  NCV & EMG Findings: Extensive electrodiagnostic evaluation of bilateral lower limb shows: Bilateral sural and superficial peroneal/fibular sensory responses are within normal limits. Bilateral peroneal/fibular (EDB), peroneal/fibular (TA), and tibial (AH) motor responses are within normal limits. Bilateral H reflexes are absent. There is no evidence of active or chronic motor axon loss changes affecting any of the tested muscles. Motor unit configuration and recruitment pattern is within normal limits.  Impression: This is a normal study. Specifically: No electrodiagnostic evidence of a large fiber sensorimotor neuropathy. No electrodiagnostic evidence of a left or right lumbosacral (L3-S1) motor radiculopathy. No electrodiagnostic evidence of a left or right peroneal/fibular mononeuropathy. Absent H reflexes bilaterally with an otherwise normal study is of unclear clinical significance and may be technical in nature.    ___________________________ Jacquelyne Balint, MD    Nerve Conduction Studies Motor Nerve Results    Latency Amplitude F-Lat Segment Distance CV Comment  Site (ms) Norm (mV) Norm (ms)  (cm) (m/s) Norm   Left Fibular (EDB) Motor  Ankle 3.6  < 6.0 7.2  > 2.5        Bel fib head 9.7 - 6.5 -  Bel fib head-Ankle 30 49  > 40   Pop fossa 11.5 - 6.3 -  Pop fossa-Bel fib head 9 50 -   Right Fibular (EDB) Motor  Ankle 3.4  < 6.0 7.7  > 2.5        Bel fib head 9.7 - 7.1 -  Bel fib head-Ankle 30 48  > 40   Pop fossa 11.5 - 7.1 -  Pop fossa-Bel fib head 8 44 -   Left Fibular (TA) Motor  Fib head 1.60  <  4.5 7.5  > 3.0        Pop fossa 3.2  < 6.7 6.8 -  Pop fossa-Fib head 9 56  > 40   Right Fibular (TA) Motor  Fib head 2.0  < 4.5 7.6  > 3.0        Pop fossa 3.3  < 6.7 7.4 -  Pop fossa-Fib head 8 62  > 40   Left Tibial (AH) Motor  Ankle 3.3  < 6.0 12.2  > 4.0        Knee 10.9 - 10.7 -  Knee-Ankle 37 49  > 40   Right Tibial (AH) Motor  Ankle 4.2  < 6.0 11.3  > 4.0        Knee 11.7 - 8.6 -  Knee-Ankle 39 52  > 40    Sensory Sites    Neg Peak Lat Amplitude (O-P) Segment Distance Velocity Comment  Site (ms) Norm (V) Norm  (cm) (ms)   Left Superficial Fibular Sensory  14 cm-Ankle 2.8  < 4.6 9  > 3 14 cm-Ankle 14    Right Superficial Fibular Sensory  14 cm-Ankle 2.2  < 4.6 6  > 3 14 cm-Ankle 14    Left Sural Sensory  Calf-Lat mall 3.3  < 4.6 10  > 3 Calf-Lat mall 14    Right Sural Sensory  Calf-Lat mall 3.0  < 4.6 10  >  3 Calf-Lat mall 12     H-Reflex Results    M-Lat H Lat H Neg Amp H-M Lat  Site (ms) (ms) Norm (mV) (ms)  Left Tibial H-Reflex  Pop fossa 6.4 -  < 35.0 - -  Right Tibial H-Reflex  Pop fossa 5.4 -  < 35.0 - -   Electromyography   Side Muscle Ins.Act Fibs Fasc Recrt Amp Dur Poly Activation Comment  Left Tib ant Nml Nml Nml Nml Nml Nml Nml Nml N/A  Left Gastroc MH Nml Nml Nml Nml Nml Nml Nml Nml N/A  Left Fib longus Nml Nml Nml Nml Nml Nml Nml Nml N/A  Left Rectus fem Nml Nml Nml Nml Nml Nml Nml Nml N/A  Left Biceps fem SH Nml Nml Nml Nml Nml Nml Nml Nml N/A  Left Gluteus med Nml Nml Nml Nml Nml Nml Nml Nml N/A  Right Tib ant Nml Nml Nml Nml Nml Nml Nml Nml N/A  Right Gastroc MH Nml Nml Nml Nml Nml Nml Nml Nml N/A  Right Fib longus Nml Nml Nml Nml Nml Nml Nml Nml N/A  Right Rectus fem Nml Nml Nml Nml Nml Nml Nml Nml N/A  Right Biceps fem SH Nml Nml Nml Nml Nml Nml Nml Nml N/A  Right Gluteus med Nml Nml Nml Nml Nml Nml Nml Nml N/A      Waveforms:  Motor               Sensory           H-Reflex

## 2024-01-23 NOTE — Procedures (Signed)
  [facility]  [address] [place], [address]  [place], [place] [address] Tel: [phone] Fax: [phone] Test Date:  01/23/2024  Patient: Nichole Brown DOB: [birth year] Physician: Jacquelyne Balint, MD  Sex: Female Height: 5\' 2"  Ref Phys: Ernestine Mcmurray, MD  ID#: [id number]   Technician:    History: This is a 58 year old female with pain in bilateral lower limbs.  Findings: High frequency (4.0-16.0 MHz) B-mode, nonvascular ultrasound of bilateral lower limb shows: Cross sectional area of bilateral sciatic (popliteal fossa), peroneal/fibular (popliteal fossa to fibular head), and tibial (popliteal fossa) nerves are within normal limits.  Impression: This is a normal neuromuscular ultrasound of bilateral lower limbs. Specifically, there is no ultrasonographic evidence of entrapment or other focal pathology along the studied course of bilateral sciatic nerves (popliteal fossa), peroneal/fibular nerves (popliteal fossa to fibular head), or tibial nerves (popliteal fossa).  No other obvious lesion involving the adjacent bone or tendon is identified. No definite vascular abnormalities.    _______________  Jacquelyne Balint, MD [facility]    Nerve Measurements   Site Area Mobility Vascularity Comment   mm Norm     Left Fibular  Fib head 7.2  < 17.8      Pop fossa 6.7  < 20.9      Right Fibular  Fib head 7.6  < 17.8      Pop fossa 6.9  < 20.9      Left Sciatic  Distal Thigh 31.9  < 80.0      Right Sciatic  Distal Thigh 27.2  < 80.0      Left Tibial  Pop fossa 18.8  < 55.9      Right Tibial  Pop fossa 22.2  < 55.9       Ultrasound Images:

## 2024-01-24 ENCOUNTER — Other Ambulatory Visit: Payer: Self-pay | Admitting: *Deleted

## 2024-01-24 ENCOUNTER — Other Ambulatory Visit: Payer: Self-pay

## 2024-01-24 ENCOUNTER — Other Ambulatory Visit (HOSPITAL_COMMUNITY): Payer: Self-pay

## 2024-01-24 ENCOUNTER — Telehealth: Payer: Self-pay | Admitting: *Deleted

## 2024-01-24 DIAGNOSIS — R0602 Shortness of breath: Secondary | ICD-10-CM

## 2024-01-24 DIAGNOSIS — Z79899 Other long term (current) drug therapy: Secondary | ICD-10-CM

## 2024-01-24 DIAGNOSIS — Z170 Estrogen receptor positive status [ER+]: Secondary | ICD-10-CM

## 2024-01-24 DIAGNOSIS — F4311 Post-traumatic stress disorder, acute: Secondary | ICD-10-CM | POA: Diagnosis not present

## 2024-01-24 MED ORDER — BUSPIRONE HCL 15 MG PO TABS
15.0000 mg | ORAL_TABLET | Freq: Two times a day (BID) | ORAL | 0 refills | Status: DC
  Filled 2024-01-24: qty 42, 23d supply, fill #0

## 2024-01-24 NOTE — Telephone Encounter (Signed)
Please schedule ct chest and echo which have been ordered. I will see her 2 weeks from now to discuss these results no labs

## 2024-01-24 NOTE — Telephone Encounter (Signed)
Pt called to say that she is having som SOB and sometimes sitting. I called the pt and let her know that Dr. Smith Robert wants ECHO to check your heart as well as ct scan for the lungs and she is ok with that. I will put in the orders in and schedulers will call for the appts. She is off on Monday so that would be good for her.

## 2024-01-25 ENCOUNTER — Telehealth: Payer: Self-pay | Admitting: Neurosurgery

## 2024-01-25 NOTE — Telephone Encounter (Signed)
Patient is calling to let our office know that she can see her EMG results in MyChart and would like to know the next step in her treatment plan. Please advise.

## 2024-01-29 ENCOUNTER — Ambulatory Visit: Payer: Commercial Managed Care - PPO | Admitting: Neurosurgery

## 2024-01-29 ENCOUNTER — Encounter: Payer: Self-pay | Admitting: Neurosurgery

## 2024-01-29 VITALS — BP 112/70 | Ht 62.0 in | Wt 168.0 lb

## 2024-01-29 DIAGNOSIS — G5731 Lesion of lateral popliteal nerve, right lower limb: Secondary | ICD-10-CM

## 2024-01-29 DIAGNOSIS — F4311 Post-traumatic stress disorder, acute: Secondary | ICD-10-CM | POA: Diagnosis not present

## 2024-01-29 NOTE — Progress Notes (Signed)
Referring Physician:  [name], DO [address] [place],  [place] [address]  Primary Physician:  [name], DO  History of Present Illness: 01/29/2024 Nichole Brown is here today to follow-up on her EMG.  She had an EMG and a peroneal nerve ultrasound done to evaluate for some peroneal neuropathic pain.  She has been previously followed by our team and orthopedics.  She had some conflicting electrodiagnostic testing so we had a repeat.  Those results are below.  She has not noticed any changes in her symptoms.  She does state that it gets worse when she is walking and she often notices it after day when she has been on her feet for a considerable period of time, she states that it feels like a constant buzzing like a funny bone or a TENS unit that is always on.  Review of Systems:  A 10 point review of systems is negative, except for the pertinent positives and negatives detailed in the HPI.  Past Medical History: Past Medical History:  Diagnosis Date   Anxiety    Bronchitis    Cancer (HCC) 09/2022   left breast IMC   Complication of anesthesia    PONV   Contusion of leg 10/06/2015   Depression    GERD (gastroesophageal reflux disease)    Insomnia    Migraine headache    Pneumonia    2000   PONV (postoperative nausea and vomiting)     Past Surgical History: Past Surgical History:  Procedure Laterality Date   ABDOMINAL HYSTERECTOMY     BALLOON DILATION N/A 12/14/2020   Procedure: BALLOON DILATION;  Surgeon: Lanelle Bal, DO;  [facility];  Service: Endoscopy;  Laterality: N/A;   BIOPSY  12/14/2020   Procedure: BIOPSY;  Surgeon: Lanelle Bal, DO;  [facility]: [facility];  Service: Endoscopy;;   BREAST BIOPSY Left 08/24/2022   [place] bx, coil marker +   BREAST LUMPECTOMY WITH RADIOACTIVE SEED AND SENTINEL LYMPH NODE BIOPSY Left 09/29/2022   Invasive ductal carcinoma, grade 2, pT1b, pN0  - Margins are negative for carcinoma in situ or  invasive carcinoma   CESAREAN SECTION     CHOLECYSTECTOMY  1988   COLONOSCOPY N/A 12/10/2018    six 4-8 mm polyps in proximal descending colon, mid transverse and hepatic flexure. Diverticulosis. External and internal hemorrhoids. Torturous colon. Two simple adenomas, two serrated polyps, 2 hyperplastic polyps. Surveillance due Dec 2022.    ESOPHAGOGASTRODUODENOSCOPY (EGD) WITH PROPOFOL N/A 12/14/2020   Benign-appearing esophageal stenosis s/p dilation, gastritis, normal duodenum. Mild chronic gastritis.    open heart surgery  1967   PATENT DUCTUS ARTERIOUS REPAIR  1967   POLYPECTOMY  12/10/2018   Procedure: POLYPECTOMY;  Surgeon: West Bali, MD;  Location: [facility];  Service: Endoscopy;;  hepatic flexure, descending, transverse   PORT-A-CATH REMOVAL Right 03/14/2023   Procedure: REMOVAL PORT-A-CATH;  Surgeon: Emelia Loron, MD;  Location: [facility];  Service: General;  Laterality: Right;   PORTACATH PLACEMENT N/A 11/14/2022   Procedure: PORT PLACEMENT WITH ULTRASOUND GUIDANCE;  Surgeon: Emelia Loron, MD;  Location: [facility];  Service: General;  Laterality: N/A;   SHOULDER ARTHROSCOPY Left 06/16/2017   Procedure: LEFT SHOULDER ARTHROSCOPY, DEBRIDEMENT, AND DECOMPRESSION;  Surgeon: Nadara Mustard, MD;  Location: [facility];  Service: Orthopedics;  Laterality: Left;   SHOULDER ARTHROSCOPY WITH SUBACROMIAL DECOMPRESSION Left 10/03/2019   Procedure: SHOULDER ARTHROSCOPY WITH DEBRIDEMENT, DECOMPRESSION;  Surgeon: Joice Lofts,  Excell Seltzer, MD;  Location: [facility];  Service: Orthopedics;  Laterality: Left;   SHOULDER SURGERY Left 2008   [facility] joint    Allergies: Allergies as of 01/29/2024 - Review Complete 01/29/2024  Allergen Reaction Noted   Cymbalta [duloxetine hcl] Other (See Comments) 02/09/2017   Nortriptyline Nausea And Vomiting 02/09/2017   Phenergan [promethazine hcl]  12/10/2018    Medications:  Current Outpatient Medications:    acetaminophen  (TYLENOL) 500 MG tablet, Take 500 mg by mouth every 6 (six) hours as needed for moderate pain., Disp: , Rfl:    busPIRone (BUSPAR) 15 MG tablet, Take 1/2 tablet (7.5mg ) by mouth 2 times per day for 5 days then start 1 tablet (15mg ) by mouth 2 times per day, Disp: 42 tablet, Rfl: 0   Calcium-Magnesium-Vitamin D (CALCIUM 1200+D3 PO), Take 1 Capful by mouth daily., Disp: , Rfl:    exemestane (AROMASIN) 25 MG tablet, Take 1 tablet (25 mg total) by mouth daily after breakfast., Disp: 30 tablet, Rfl: 3   Lifitegrast (XIIDRA) 5 % SOLN, Instill 1 drop into both eyes twice a day, Disp: 180 each, Rfl: 4   omega-3 acid ethyl esters (LOVAZA) 1 g capsule, Take by mouth 2 (two) times daily., Disp: , Rfl:    ondansetron (ZOFRAN-ODT) 4 MG disintegrating tablet, Take 1 tablet (4 mg total) by mouth every 8 (eight) hours as needed for Nausea migraine, Disp: 20 tablet, Rfl: 11   Probiotic Product (PROBIOTIC PO), Take 1 capsule by mouth daily., Disp: , Rfl:    rizatriptan (MAXALT-MLT) 10 MG disintegrating tablet, Take 1 tablet (10 mg total) by mouth once as needed (may repeat dose once in > 2 hrs) for up to 1 dose, Disp: 10 tablet, Rfl: 11   topiramate (TOPAMAX) 25 MG tablet, Take 1 tablet (25 mg total) by mouth 2 (two) times daily., Disp: 60 tablet, Rfl: 11  Social History: Social History   Tobacco Use   Smoking status: Never   Smokeless tobacco: Never  Vaping Use   Vaping status: Never Used  Substance Use Topics   Alcohol use: Not Currently    Comment: occassionally-not since chemo started   Drug use: No    Family Medical History: Family History  Problem Relation Age of Onset   Breast cancer Mother 42   Hypertension Father    Colon cancer Paternal Uncle        dx 64s   Hypertension Maternal Grandmother    Diabetes Maternal Grandmother    Heart disease Maternal Grandmother 35       MI   Hypertension Maternal Grandfather    Throat cancer Maternal Grandfather 59    Physical Examination: Vitals:    01/29/24 1408  BP: 112/70    General: Patient is in no apparent distress. Attention to examination is appropriate.  Neck:   Supple.  Full range of motion.  Respiratory: Patient is breathing without any difficulty.   NEUROLOGICAL:     Awake, alert, oriented to person, place, and time.  Speech is clear and fluent.   Cranial Nerves: Pupils equal round and reactive to light.  Facial tone is symmetric. Shoulder shrug is symmetric. Tongue protrusion is midline.  There is no pronator drift.  Motor Exam:  On physical examination she does not demonstrate any clear motor deficits.  She has good excursion of her dorsiflexion and EHL function.  No obvious deficit in plantarflexion eversion or inversion.  Reflexes are trace throughout the bilateral lower extremities  Decreased predominantly in the superficial  distribution, however appears to have some decrease in her lower extremity distal to her mid calf  No evidence of steppage gait  She does have a significant Tinel sign at the peroneal exit and the lateral fascia near the fibular shaft.  She also has a significant Tinel at the posterior fibula near the knee.   Medical Decision Making   [facility]  [address] [place], [address]  [place], [place] [address] Tel: [phone] Fax: [phone] Test Date:  01/23/2024   Patient: Nichole Brown DOB: January 12, 1966 Physician: Jacquelyne Balint, MD  Sex: Female Height: 5\' 2"  Ref Phys: Ernestine Mcmurray, MD  ID#: [id number]     Technician:      History: This is a 58 year old female with pain in bilateral lower limbs.   Findings: High frequency (4.0-16.0 MHz) B-mode, nonvascular ultrasound of bilateral lower limb shows: Cross sectional area of bilateral sciatic (popliteal fossa), peroneal/fibular (popliteal fossa to fibular head), and tibial (popliteal fossa) nerves are within normal limits.   Impression: This is a normal neuromuscular ultrasound of bilateral lower limbs. Specifically, there  is no ultrasonographic evidence of entrapment or other focal pathology along the studied course of bilateral sciatic nerves (popliteal fossa), peroneal/fibular nerves (popliteal fossa to fibular head), or tibial nerves (popliteal fossa).   No other obvious lesion involving the adjacent bone or tendon is identified. No definite vascular abnormalities.       _______________   Jacquelyne Balint, MD [facility]  Electrodiagnostics:  [facility]  [address] [place], [address]  [place], [place] [address] Tel: [phone] Fax: [phone] Test Date:  01/23/2024   Patient: Nichole Brown DOB: August 22, 1966 Physician: Jacquelyne Balint, MD  Sex: Female Height: 5\' 2"  Ref Phys: Ernestine Mcmurray, MD  ID#: [id number]     Technician:      History: This is a 58 year old female with pain in bilateral lower limbs.   NCV & EMG Findings: Extensive electrodiagnostic evaluation of bilateral lower limb shows: Bilateral sural and superficial peroneal/fibular sensory responses are within normal limits. Bilateral peroneal/fibular (EDB), peroneal/fibular (TA), and tibial (AH) motor responses are within normal limits. Bilateral H reflexes are absent. There is no evidence of active or chronic motor axon loss changes affecting any of the tested muscles. Motor unit configuration and recruitment pattern is within normal limits.   Impression: This is a normal study. Specifically: No electrodiagnostic evidence of a large fiber sensorimotor neuropathy. No electrodiagnostic evidence of a left or right lumbosacral (L3-S1) motor radiculopathy. No electrodiagnostic evidence of a left or right peroneal/fibular mononeuropathy. Absent H reflexes bilaterally with an otherwise normal study is of unclear clinical significance and may be technical in nature.       ___________________________ Jacquelyne Balint, MD  I have personally reviewed the images and electrodiagnostics and agree with the above  interpretation.  Assessment and Plan: Ms. Tatlock is a pleasant 58 y.o. female with pain mostly in the bilateral right worse than left peroneal nerve distribution.  She states this has been ongoing and she has had significant workup for this.  Her risk factors include history of breast cancer with chemotherapy and chemotherapy related neuropathy, however this has been going on since before she had chemotherapy..  She states that when she walks or stands for a significant period of time she gets worsening pain in the lateral aspect of her distal lower extremity that radiates to the top of her foot.  On physical examination she does not have a foot drop, however  she does have a significant Tinel at the posterior fibular head as well as the fibular shaft at the exit site of the peroneal nerve bilaterally.  Interestingly, her EMG did not come back with any active neuropathy or nerve compression in the lower extremity.  The peroneal ultrasound also demonstrated stability without increased cross-section.  Given the intermittent nature of her symptoms and worsening with ambulation and exertion she could potentially have a intraneural peroneal cyst.  Will plan on getting an MRI with and without contrast of the right knee to evaluate for any intraneural ganglion cyst in the peroneal nerve as this could present with a mixed deep/superficial peroneal neuropathy that vacillates with activity.  We will follow-up after the MRI.   Thank you for involving me in the care of this patient.    Lovenia Kim MD/MSCR Neurosurgery - Peripheral Nerve Surgery

## 2024-01-30 ENCOUNTER — Ambulatory Visit
Admission: RE | Admit: 2024-01-30 | Discharge: 2024-01-30 | Disposition: A | Discharge status: Home / Self Care | Payer: Commercial Managed Care - PPO | Source: Ambulatory Visit | Attending: Oncology | Admitting: Oncology

## 2024-01-30 DIAGNOSIS — Z170 Estrogen receptor positive status [ER+]: Secondary | ICD-10-CM | POA: Diagnosis not present

## 2024-01-30 DIAGNOSIS — C50412 Malignant neoplasm of upper-outer quadrant of left female breast: Secondary | ICD-10-CM | POA: Insufficient documentation

## 2024-01-30 DIAGNOSIS — Z79899 Other long term (current) drug therapy: Secondary | ICD-10-CM | POA: Diagnosis not present

## 2024-01-30 DIAGNOSIS — D3502 Benign neoplasm of left adrenal gland: Secondary | ICD-10-CM | POA: Diagnosis not present

## 2024-01-30 DIAGNOSIS — R0602 Shortness of breath: Secondary | ICD-10-CM | POA: Insufficient documentation

## 2024-01-30 DIAGNOSIS — C50919 Malignant neoplasm of unspecified site of unspecified female breast: Secondary | ICD-10-CM | POA: Diagnosis not present

## 2024-01-30 MED ORDER — IOHEXOL 300 MG/ML  SOLN
75.0000 mL | Freq: Once | INTRAMUSCULAR | Status: AC | PRN
  Administered 2024-01-30: 75 mL via INTRAVENOUS

## 2024-01-31 ENCOUNTER — Ambulatory Visit: Admission: RE | Admit: 2024-01-31 | Payer: Commercial Managed Care - PPO | Source: Ambulatory Visit

## 2024-02-01 ENCOUNTER — Other Ambulatory Visit: Payer: Self-pay

## 2024-02-01 ENCOUNTER — Ambulatory Visit
Admission: RE | Admit: 2024-02-01 | Discharge: 2024-02-01 | Disposition: A | Discharge status: Home / Self Care | Payer: Commercial Managed Care - PPO | Source: Ambulatory Visit | Attending: Oncology

## 2024-02-01 ENCOUNTER — Ambulatory Visit
Admission: RE | Admit: 2024-02-01 | Discharge: 2024-02-01 | Disposition: A | Discharge status: Home / Self Care | Payer: Commercial Managed Care - PPO | Source: Ambulatory Visit | Attending: Neurosurgery | Admitting: Neurosurgery

## 2024-02-01 DIAGNOSIS — Z170 Estrogen receptor positive status [ER+]: Secondary | ICD-10-CM

## 2024-02-01 DIAGNOSIS — C50412 Malignant neoplasm of upper-outer quadrant of left female breast: Secondary | ICD-10-CM

## 2024-02-01 DIAGNOSIS — M7989 Other specified soft tissue disorders: Secondary | ICD-10-CM | POA: Diagnosis not present

## 2024-02-01 DIAGNOSIS — M79661 Pain in right lower leg: Secondary | ICD-10-CM | POA: Diagnosis not present

## 2024-02-01 DIAGNOSIS — G5731 Lesion of lateral popliteal nerve, right lower limb: Secondary | ICD-10-CM

## 2024-02-01 DIAGNOSIS — Z79899 Other long term (current) drug therapy: Secondary | ICD-10-CM | POA: Diagnosis not present

## 2024-02-01 DIAGNOSIS — R0602 Shortness of breath: Secondary | ICD-10-CM

## 2024-02-01 LAB — ECHOCARDIOGRAM COMPLETE
AR max vel: 2.36 cm2
AV Area VTI: 2.29 cm2
AV Area mean vel: 2.53 cm2
AV Mean grad: 3 mm[Hg]
AV Peak grad: 6.4 mm[Hg]
Ao pk vel: 1.26 m/s
Area-P 1/2: 4.44 cm2
Calc EF: 52.4 %
MV VTI: 2.2 cm2
S' Lateral: 2.8 cm
Single Plane A2C EF: 54.3 %
Single Plane A4C EF: 54.6 %

## 2024-02-01 MED ORDER — GADOBUTROL 1 MMOL/ML IV SOLN
7.5000 mL | Freq: Once | INTRAVENOUS | Status: AC | PRN
  Administered 2024-02-01: 7.5 mL via INTRAVENOUS

## 2024-02-01 NOTE — Progress Notes (Signed)
*  PRELIMINARY RESULTS* Echocardiogram 2D Echocardiogram has been performed.  Carolyne Fiscal 02/01/2024, 11:13 AM

## 2024-02-01 NOTE — Telephone Encounter (Signed)
Will place order and inform patient.

## 2024-02-02 ENCOUNTER — Ambulatory Visit: Admission: RE | Admit: 2024-02-02 | Payer: Commercial Managed Care - PPO | Source: Ambulatory Visit

## 2024-02-02 ENCOUNTER — Ambulatory Visit: Payer: Commercial Managed Care - PPO

## 2024-02-05 ENCOUNTER — Ambulatory Visit
Admission: RE | Admit: 2024-02-05 | Discharge: 2024-02-05 | Disposition: A | Discharge status: Home / Self Care | Payer: Commercial Managed Care - PPO | Source: Ambulatory Visit | Attending: Neurosurgery | Admitting: Neurosurgery

## 2024-02-05 DIAGNOSIS — G5731 Lesion of lateral popliteal nerve, right lower limb: Secondary | ICD-10-CM | POA: Diagnosis not present

## 2024-02-05 DIAGNOSIS — M5136 Other intervertebral disc degeneration, lumbar region with discogenic back pain only: Secondary | ICD-10-CM | POA: Diagnosis not present

## 2024-02-05 DIAGNOSIS — M48061 Spinal stenosis, lumbar region without neurogenic claudication: Secondary | ICD-10-CM | POA: Diagnosis not present

## 2024-02-05 DIAGNOSIS — M5137 Other intervertebral disc degeneration, lumbosacral region with discogenic back pain only: Secondary | ICD-10-CM | POA: Diagnosis not present

## 2024-02-06 ENCOUNTER — Ambulatory Visit: Payer: Commercial Managed Care - PPO | Admitting: Family Medicine

## 2024-02-06 ENCOUNTER — Telehealth: Payer: Self-pay | Admitting: *Deleted

## 2024-02-06 VITALS — BP 104/71 | HR 72 | Temp 97.3°F | Ht 62.0 in | Wt 168.0 lb

## 2024-02-06 DIAGNOSIS — H1031 Unspecified acute conjunctivitis, right eye: Secondary | ICD-10-CM | POA: Diagnosis not present

## 2024-02-06 DIAGNOSIS — H6991 Unspecified Eustachian tube disorder, right ear: Secondary | ICD-10-CM | POA: Insufficient documentation

## 2024-02-06 DIAGNOSIS — H109 Unspecified conjunctivitis: Secondary | ICD-10-CM | POA: Insufficient documentation

## 2024-02-06 MED ORDER — MOXIFLOXACIN HCL 0.5 % OP SOLN: 1.0000 [drp] | Freq: Three times a day (TID) | OPHTHALMIC | 0 refills | Status: AC

## 2024-02-06 MED ORDER — CETIRIZINE-PSEUDOEPHEDRINE ER 5-120 MG PO TB12: 1.0000 | ORAL_TABLET | Freq: Two times a day (BID) | ORAL | 0 refills | Status: DC

## 2024-02-06 MED ORDER — FLUTICASONE PROPIONATE 50 MCG/ACT NA SUSP: 2.0000 | Freq: Every day | NASAL | 6 refills | Status: DC

## 2024-02-06 NOTE — Assessment & Plan Note (Signed)
 Treating with Zyrtec D and Flonase.

## 2024-02-06 NOTE — Telephone Encounter (Signed)
 I spoke to the patient and told her that we are back looged for the results and it has been taking up to 14 days . She understands and will wait for the results

## 2024-02-06 NOTE — Progress Notes (Signed)
 Subjective:  Patient ID: Nichole Brown, female    DOB: 12/11/1966  Age: 58 y.o. MRN: [id number]  CC:   Chief Complaint  Patient presents with   right era pressure and fullness    3 weeks Dizziness, right eye drainage - possible sinus problem    HPI:  58 year old female presents for evaluation of the above.  Patient reports that she has had right ear pressure and fullness for the past 3 weeks.  No relieving factors.  She has had some associated dizziness.  Patient also reports that she has had some right eye discharge and crusting/matting in the morning.  No other significant respiratory symptoms.  No relieving factors.  No other complaints.  Patient Active Problem List   Diagnosis Date Noted   Eustachian tube dysfunction, right 02/06/2024   Conjunctivitis 02/06/2024   Neuropathy of right peroneal nerve 01/15/2024   Neuropathy of left peroneal nerve 01/15/2024   Neuropathy due to chemotherapeutic drug (HCC) 01/15/2024   Acute midline low back pain without sciatica 01/15/2024   Arthralgia of multiple sites 08/04/2023   Dermoid cyst of left upper extremity 08/04/2023   Malignant neoplasm of upper-outer quadrant of left breast in female, estrogen receptor positive (HCC) 08/29/2022   Depression, major, single episode, moderate (HCC) 01/01/2019   Generalized anxiety disorder 01/01/2019   Gastroesophageal reflux disease 12/26/2016   Migraine headache 07/24/2013   Insomnia 05/30/2013   Fibromyalgia 05/30/2013    Social Hx   Social History   Socioeconomic History   Marital status: Married    Spouse name: daniel   Number of children: 4   Years of education: Not on file   Highest education level: Associate degree: occupational, Scientist, product/process development, or vocational program  Occupational History   Not on file  Tobacco Use   Smoking status: Never   Smokeless tobacco: Never  Vaping Use   Vaping status: Never Used  Substance and Sexual Activity   Alcohol use: Not Currently    Comment:  occassionally-not since chemo started   Drug use: No   Sexual activity: Yes    Birth control/protection: Surgical    Comment: hyst  Other Topics Concern   Not on file  Social History Narrative   Not on file   Social Drivers of Health   Financial Resource Strain: Low Risk  (10/20/2023)   Overall Financial Resource Strain (CARDIA)    Difficulty of Paying Living Expenses: Not hard at all  Food Insecurity: No Food Insecurity (10/20/2023)   Hunger Vital Sign    Worried About Running Out of Food in the Last Year: Never true    Ran Out of Food in the Last Year: Never true  Transportation Needs: No Transportation Needs (10/20/2023)   PRAPARE - Administrator, Civil Service (Medical): No    Lack of Transportation (Non-Medical): No  Physical Activity: Sufficiently Active (10/20/2023)   Exercise Vital Sign    Days of Exercise per Week: 7 days    Minutes of Exercise per Session: 30 min  Stress: Stress Concern Present (10/20/2023)   [employer] of Occupational Health - Occupational Stress Questionnaire    Feeling of Stress : To some extent  Social Connections: Socially Integrated (10/20/2023)   Social Connection and Isolation Panel [NHANES]    Frequency of Communication with Friends and Family: Twice a week    Frequency of Social Gatherings with Friends and Family: Three times a week    Attends Religious Services: More than 4 times per year  Active Member of Clubs or Organizations: Yes    Attends Banker Meetings: More than 4 times per year    Marital Status: Married    Review of Systems Per HPI  Objective:  BP 104/71   Pulse 72   Temp (!) 97.3 F (36.3 C)   Ht 5\' 2"  (1.575 m)   Wt 168 lb (76.2 kg)   SpO2 99%   BMI 30.73 kg/m      02/06/2024    9:48 AM 01/29/2024    2:08 PM 01/15/2024    2:18 PM  BP/Weight  Systolic BP 104 112 108  Diastolic BP 71 70 68  Wt. (Lbs) 168 168 168  BMI 30.73 kg/m2 30.73 kg/m2 30.73 kg/m2    Physical Exam Vitals  and nursing note reviewed.  Constitutional:      General: She is not in acute distress.    Appearance: Normal appearance.  HENT:     Head: Normocephalic and atraumatic.     Right Ear: Tympanic membrane normal.     Left Ear: Tympanic membrane normal.  Eyes:     Comments: No current conjunctival injection or discharge.   Cardiovascular:     Rate and Rhythm: Normal rate and regular rhythm.  Pulmonary:     Effort: Pulmonary effort is normal.     Breath sounds: Normal breath sounds. No wheezing, rhonchi or rales.  Neurological:     Mental Status: She is alert.  Psychiatric:        Mood and Affect: Mood normal.        Behavior: Behavior normal.     Lab Results  Component Value Date   WBC 7.8 01/08/2024   HGB 12.8 01/08/2024   HCT 38.4 01/08/2024   PLT 230 01/08/2024   GLUCOSE 99 01/08/2024   CHOL 175 05/22/2023   TRIG 282 (H) 05/22/2023   HDL 46 05/22/2023   LDLCALC 73 05/22/2023   ALT 20 01/08/2024   AST 15 01/08/2024   NA 140 01/08/2024   K 4.0 01/08/2024   CL 108 01/08/2024   CREATININE 0.90 01/08/2024   BUN 18 01/08/2024   CO2 24 01/08/2024   TSH 2.206 10/29/2021   HGBA1C 6.0 (H) 05/22/2023     Assessment & Plan:  Eustachian tube dysfunction, right Assessment & Plan: Treating with Zyrtec D and Flonase.   Orders: -     Cetirizine-Pseudoephedrine ER; Take 1 tablet by mouth 2 (two) times daily.  Dispense: 20 tablet; Refill: 0 -     Fluticasone Propionate; Place 2 sprays into both nostrils daily.  Dispense: 16 g; Refill: 6  Acute conjunctivitis of right eye, unspecified acute conjunctivitis type Assessment & Plan: Vigamox as directed.   Orders: -     Moxifloxacin HCl; Place 1 drop into the right eye 3 (three) times daily for 7 days.  Dispense: 3 mL; Refill: 0    Follow-up:  Return if symptoms worsen or fail to improve.  Everlene Other DO [facility]

## 2024-02-06 NOTE — Assessment & Plan Note (Signed)
 Vigamox as directed.

## 2024-02-07 ENCOUNTER — Other Ambulatory Visit: Payer: Self-pay

## 2024-02-14 ENCOUNTER — Encounter: Payer: Self-pay | Admitting: Oncology

## 2024-02-14 ENCOUNTER — Inpatient Hospital Stay: Payer: Commercial Managed Care - PPO | Attending: Oncology | Admitting: Oncology

## 2024-02-14 VITALS — BP 103/67 | HR 73 | Temp 97.7°F | Resp 14

## 2024-02-14 DIAGNOSIS — Z170 Estrogen receptor positive status [ER+]: Secondary | ICD-10-CM | POA: Diagnosis not present

## 2024-02-14 DIAGNOSIS — M47814 Spondylosis without myelopathy or radiculopathy, thoracic region: Secondary | ICD-10-CM | POA: Diagnosis not present

## 2024-02-14 DIAGNOSIS — Z08 Encounter for follow-up examination after completed treatment for malignant neoplasm: Secondary | ICD-10-CM

## 2024-02-14 DIAGNOSIS — Z79899 Other long term (current) drug therapy: Secondary | ICD-10-CM | POA: Diagnosis not present

## 2024-02-14 DIAGNOSIS — Z923 Personal history of irradiation: Secondary | ICD-10-CM | POA: Insufficient documentation

## 2024-02-14 DIAGNOSIS — M48061 Spinal stenosis, lumbar region without neurogenic claudication: Secondary | ICD-10-CM | POA: Insufficient documentation

## 2024-02-14 DIAGNOSIS — Z853 Personal history of malignant neoplasm of breast: Secondary | ICD-10-CM | POA: Diagnosis not present

## 2024-02-14 DIAGNOSIS — M5136 Other intervertebral disc degeneration, lumbar region with discogenic back pain only: Secondary | ICD-10-CM | POA: Diagnosis not present

## 2024-02-14 DIAGNOSIS — Z800 Family history of malignant neoplasm of digestive organs: Secondary | ICD-10-CM | POA: Diagnosis not present

## 2024-02-14 DIAGNOSIS — Z803 Family history of malignant neoplasm of breast: Secondary | ICD-10-CM | POA: Diagnosis not present

## 2024-02-14 DIAGNOSIS — R0602 Shortness of breath: Secondary | ICD-10-CM | POA: Diagnosis not present

## 2024-02-14 DIAGNOSIS — Z9221 Personal history of antineoplastic chemotherapy: Secondary | ICD-10-CM | POA: Insufficient documentation

## 2024-02-14 DIAGNOSIS — Z79811 Long term (current) use of aromatase inhibitors: Secondary | ICD-10-CM | POA: Diagnosis not present

## 2024-02-14 DIAGNOSIS — C50912 Malignant neoplasm of unspecified site of left female breast: Secondary | ICD-10-CM | POA: Diagnosis not present

## 2024-02-14 DIAGNOSIS — K219 Gastro-esophageal reflux disease without esophagitis: Secondary | ICD-10-CM | POA: Insufficient documentation

## 2024-02-15 ENCOUNTER — Encounter: Payer: Self-pay | Admitting: Oncology

## 2024-02-15 ENCOUNTER — Telehealth: Payer: Self-pay | Admitting: Neurosurgery

## 2024-02-15 NOTE — Telephone Encounter (Signed)
 She confirmed call for 02/19/2024 at 3:30pm.

## 2024-02-15 NOTE — Telephone Encounter (Signed)
 LMOM for patient to return call to get an appointment on Monday with Dr. Katrinka Blazing for a telephone visit.

## 2024-02-15 NOTE — Telephone Encounter (Signed)
 Patient is calling to let our office know that her imaging results are back and to find out what the next step is in her treatment plan. Please advise.

## 2024-02-15 NOTE — Progress Notes (Signed)
 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  This was followed by a diagnostic mammogram and ultrasound which showed multiple benign cysts noted at the 1 o'clock position.  At the 2 o'clock position 3 cm from the nipple was a oval hypoechoic mass measuring 5 x 4 x 3 mm.  No suspicious left axillary adenopathy.  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   Final pathology showed grade two 7 mm tumor with negative margins.  1 sentinel lymph node negative for malignancy.  Oncotype results showed a recurrence score of 28 and absolute benefit of chemotherapy at greater than 15%.   Patient completed 4 cycles of adjuvant TC chemotherapy in February 2024.  She also completed adjuvant radiation and started taking Arimidex in March 2024.  She subsequently switched to exemestane with better tolerance she is also on adjuvant Zometa.        Interval history-patient continues to report subjective exertional shortness  of breath when she climbs 1-2 flights of stairs.  States that she could do these things effortlessly prior to her chemotherapy but now she has to catch her breath when she climbs a flight of stairs.  Denies any shortness of breath or chest pain at rest.  ECOG PS- 0 Pain scale- 0   Review of systems- Review of Systems  Constitutional:  Negative for chills, fever, malaise/fatigue and weight loss.  HENT:  Negative for congestion, ear discharge and nosebleeds.   Eyes:  Negative for blurred vision.  Respiratory:  Negative for cough, hemoptysis, sputum production, shortness of breath and wheezing.   Cardiovascular:  Negative for chest pain, palpitations, orthopnea and claudication.  Gastrointestinal:  Negative for abdominal pain, blood in stool, constipation, diarrhea, heartburn, melena, nausea and vomiting.  Genitourinary:  Negative for dysuria, flank pain, frequency, hematuria and urgency.  Musculoskeletal:  Negative for back pain, joint pain and myalgias.  Skin:  Negative for rash.  Neurological:  Negative for dizziness, tingling, focal weakness, seizures, weakness and headaches.  Endo/Heme/Allergies:  Does not bruise/bleed easily.  Psychiatric/Behavioral:  Negative for depression and suicidal ideas. The patient does not have insomnia.       Allergies  Allergen Reactions   Cymbalta [Duloxetine Hcl] Other (See Comments)    Hallucinations; sleep walking   Nortriptyline Nausea And Vomiting   Phenergan [Promethazine Hcl]     Restless legs and cramping     Past Medical History:  Diagnosis Date   Anxiety    Bronchitis    Cancer (HCC) 09/2022   left breast IMC   Complication of anesthesia    PONV  Contusion of leg 10/06/2015   Depression    GERD (gastroesophageal reflux disease)    Insomnia    Migraine headache    Pneumonia    2000   PONV (postoperative nausea and vomiting)      Past Surgical History:  Procedure Laterality Date   ABDOMINAL HYSTERECTOMY     BALLOON  DILATION N/A 12/14/2020   Procedure: BALLOON DILATION;  Surgeon: Lanelle Bal, DO;  [facility];  Service: Endoscopy;  Laterality: N/A;   BIOPSY  12/14/2020   Procedure: BIOPSY;  Surgeon: Lanelle Bal, DO;  [facility];  Service: Endoscopy;;   BREAST BIOPSY Left 08/24/2022   [place] bx, coil marker +   BREAST LUMPECTOMY WITH RADIOACTIVE SEED AND SENTINEL LYMPH NODE BIOPSY Left 09/29/2022   Invasive ductal carcinoma, grade 2, pT1b, pN0  - Margins are negative for carcinoma in situ or invasive carcinoma   CESAREAN SECTION     CHOLECYSTECTOMY  1988   COLONOSCOPY N/A 12/10/2018    six 4-8 mm polyps in proximal descending colon, mid transverse and hepatic flexure. Diverticulosis. External and internal hemorrhoids. Torturous colon. Two simple adenomas, two serrated polyps, 2 hyperplastic polyps. Surveillance due Dec 2022.    ESOPHAGOGASTRODUODENOSCOPY (EGD) WITH PROPOFOL N/A 12/14/2020   Benign-appearing esophageal stenosis s/p dilation, gastritis, normal duodenum. Mild chronic gastritis.    open heart surgery  1967   PATENT DUCTUS ARTERIOUS REPAIR  1967   POLYPECTOMY  12/10/2018   Procedure: POLYPECTOMY;  Surgeon: West Bali, MD;  [facility]: [facility];  Service: Endoscopy;;  hepatic flexure, descending, transverse   PORT-A-CATH REMOVAL Right 03/14/2023   Procedure: REMOVAL PORT-A-CATH;  Surgeon: Emelia Loron, MD;  Location: [facility];  Service: General;  Laterality: Right;   PORTACATH PLACEMENT N/A 11/14/2022   Procedure: PORT PLACEMENT WITH ULTRASOUND GUIDANCE;  Surgeon: Emelia Loron, MD;  Location: [facility];  Service: General;  Laterality: N/A;   SHOULDER ARTHROSCOPY Left 06/16/2017   Procedure: LEFT SHOULDER ARTHROSCOPY, DEBRIDEMENT, AND DECOMPRESSION;  Surgeon: Nadara Mustard, MD;  Location: [facility];  Service: Orthopedics;  Laterality: Left;   SHOULDER ARTHROSCOPY WITH SUBACROMIAL DECOMPRESSION Left 10/03/2019    Procedure: SHOULDER ARTHROSCOPY WITH DEBRIDEMENT, DECOMPRESSION;  Surgeon: Christena Flake, MD;  Location: [facility];  Service: Orthopedics;  Laterality: Left;   SHOULDER SURGERY Left 2008   [facility] joint    Social History   Socioeconomic History   Marital status: Married    Spouse name: daniel   Number of children: 4   Years of education: Not on file   Highest education level: Associate degree: occupational, Scientist, product/process development, or vocational program  Occupational History   Not on file  Tobacco Use   Smoking status: Never   Smokeless tobacco: Never  Vaping Use   Vaping status: Never Used  Substance and Sexual Activity   Alcohol use: Not Currently    Comment: occassionally-not since chemo started   Drug use: No   Sexual activity: Yes    Birth control/protection: Surgical    Comment: hyst  Other Topics Concern   Not on file  Social History Narrative   Not on file   Social Drivers of Health   Financial Resource Strain: Low Risk  (10/20/2023)   Overall Financial Resource Strain (CARDIA)    Difficulty of Paying Living Expenses: Not hard at all  Food Insecurity: No Food Insecurity (10/20/2023)   Hunger Vital Sign    Worried About Running Out of Food in the Last  Year: Never true    Ran Out of Food in the Last Year: Never true  Transportation Needs: No Transportation Needs (10/20/2023)   PRAPARE - Administrator, Civil Service (Medical): No    Lack of Transportation (Non-Medical): No  Physical Activity: Sufficiently Active (10/20/2023)   Exercise Vital Sign    Days of Exercise per Week: 7 days    Minutes of Exercise per Session: 30 min  Stress: Stress Concern Present (10/20/2023)   [employer] of Occupational Health - Occupational Stress Questionnaire    Feeling of Stress : To some extent  Social Connections: Socially Integrated (10/20/2023)   Social Connection and Isolation Panel [NHANES]    Frequency of Communication with Friends and Family: Twice a week    Frequency of  Social Gatherings with Friends and Family: Three times a week    Attends Religious Services: More than 4 times per year    Active Member of Clubs or Organizations: Yes    Attends Banker Meetings: More than 4 times per year    Marital Status: Married  Catering manager Violence: Not At Risk (07/12/2022)   Humiliation, Afraid, Rape, and Kick questionnaire    Fear of Current or Ex-Partner: No    Emotionally Abused: No    Physically Abused: No    Sexually Abused: No    Family History  Problem Relation Age of Onset   Breast cancer Mother 56   Hypertension Father    Colon cancer Paternal Uncle        dx 62s   Hypertension Maternal Grandmother    Diabetes Maternal Grandmother    Heart disease Maternal Grandmother 40       MI   Hypertension Maternal Grandfather    Throat cancer Maternal Grandfather 76     Current Outpatient Medications:    acetaminophen (TYLENOL) 500 MG tablet, Take 500 mg by mouth every 6 (six) hours as needed for moderate pain., Disp: , Rfl:    Calcium-Magnesium-Vitamin D (CALCIUM 1200+D3 PO), Take 1 Capful by mouth daily., Disp: , Rfl:    exemestane (AROMASIN) 25 MG tablet, Take 1 tablet (25 mg total) by mouth daily after breakfast., Disp: 30 tablet, Rfl: 3   topiramate (TOPAMAX) 25 MG tablet, Take 1 tablet (25 mg total) by mouth 2 (two) times daily., Disp: 60 tablet, Rfl: 11   busPIRone (BUSPAR) 15 MG tablet, Take 1/2 tablet (7.5mg ) by mouth 2 times per day for 5 days then start 1 tablet (15mg ) by mouth 2 times per day (Patient not taking: Reported on 02/14/2024), Disp: 42 tablet, Rfl: 0   cetirizine-pseudoephedrine (ZYRTEC-D) 5-120 MG tablet, Take 1 tablet by mouth 2 (two) times daily. (Patient not taking: Reported on 02/14/2024), Disp: 20 tablet, Rfl: 0   fluticasone (FLONASE) 50 MCG/ACT nasal spray, Place 2 sprays into both nostrils daily. (Patient not taking: Reported on 02/14/2024), Disp: 16 g, Rfl: 6   Lifitegrast (XIIDRA) 5 % SOLN, Instill 1 drop into both  eyes twice a day (Patient not taking: Reported on 02/14/2024), Disp: 180 each, Rfl: 4   omega-3 acid ethyl esters (LOVAZA) 1 g capsule, Take by mouth 2 (two) times daily. (Patient not taking: Reported on 02/14/2024), Disp: , Rfl:    ondansetron (ZOFRAN-ODT) 4 MG disintegrating tablet, Take 1 tablet (4 mg total) by mouth every 8 (eight) hours as needed for Nausea migraine (Patient not taking: Reported on 02/14/2024), Disp: 20 tablet, Rfl: 11   Probiotic Product (PROBIOTIC PO), Take 1 capsule by mouth  daily. (Patient not taking: Reported on 02/14/2024), Disp: , Rfl:    rizatriptan (MAXALT-MLT) 10 MG disintegrating tablet, Take 1 tablet (10 mg total) by mouth once as needed (may repeat dose once in > 2 hrs) for up to 1 dose (Patient not taking: Reported on 02/14/2024), Disp: 10 tablet, Rfl: 11  Physical exam:  Vitals:   02/14/24 1513  BP: 103/67  Pulse: 73  Resp: 14  Temp: 97.7 F (36.5 C)  TempSrc: Tympanic  SpO2: 98%   Physical Exam Cardiovascular:     Rate and Rhythm: Normal rate and regular rhythm.     Heart sounds: Normal heart sounds.  Pulmonary:     Effort: Pulmonary effort is normal.     Breath sounds: Normal breath sounds.  Skin:    General: Skin is warm and dry.  Neurological:     Mental Status: She is alert and oriented to person, place, and time.         Latest Ref Rng & Units 01/08/2024   12:45 PM  CMP  Glucose 70 - 99 mg/dL 99   BUN 6 - 20 mg/dL 18   Creatinine 6.04 - 1.00 mg/dL 5.40   Sodium 981 - 191 mmol/L 140   Potassium 3.5 - 5.1 mmol/L 4.0   Chloride 98 - 111 mmol/L 108   CO2 22 - 32 mmol/L 24   Calcium 8.9 - 10.3 mg/dL 8.9   Total Protein 6.5 - 8.1 g/dL 6.6   Total Bilirubin 0.0 - 1.2 mg/dL 0.4   Alkaline Phos 38 - 126 U/L 48   AST 15 - 41 U/L 15   ALT 0 - 44 U/L 20       Latest Ref Rng & Units 01/08/2024   12:51 PM  CBC  WBC 4.0 - 10.5 K/uL 7.8   Hemoglobin 12.0 - 15.0 g/dL 47.8   Hematocrit 29.5 - 46.0 % 38.4   Platelets 150 - 400 K/uL 230     No  images are attached to the encounter.  CT Chest W Contrast Result Date: 02/14/2024 CLINICAL DATA:  Restaging breast cancer post chemotherapy and adjuvant radiation. * Tracking Code: BO * EXAM: CT CHEST WITH CONTRAST TECHNIQUE: Multidetector CT imaging of the chest was performed during intravenous contrast administration. RADIATION DOSE REDUCTION: This exam was performed according to the departmental dose-optimization program which includes automated exposure control, adjustment of the mA and/or kV according to patient size and/or use of iterative reconstruction technique. CONTRAST:  75mL OMNIPAQUE IOHEXOL 300 MG/ML  SOLN COMPARISON:  Chest CTA 12/24/2018.  Abdominal CT 01/30/2020. FINDINGS: Cardiovascular: There are no significant vascular findings. The heart size is normal. There is no pericardial effusion. Mediastinum/Nodes: There are no enlarged mediastinal, hilar, axillary or internal mammary lymph nodes. Postsurgical changes in the left axilla. The thyroid gland, trachea and esophagus demonstrate no significant findings. Lungs/Pleura: No pleural effusion or pneumothorax. Mild subpleural radiation changes anteriorly in the left upper lobe. The lungs are otherwise clear, without suspicious nodularity. Upper abdomen: Minimal enlargement of left adrenal nodule previously characterized as an adenoma, currently measuring 1.6 cm on image 159/2. No specific follow-up imaging recommended. The right adrenal gland appears normal. No acute findings are seen in the visualized upper abdomen. Stable mild biliary dilatation status post cholecystectomy. Musculoskeletal/Chest wall: There is no chest wall mass or suspicious osseous finding. Postsurgical changes laterally in the left breast. Mild thoracic spondylosis. IMPRESSION: 1. No evidence of local recurrence or metastatic disease in the chest. 2. Postsurgical and radiation changes in  the left breast and left axilla. Electronically Signed   By: Carey Bullocks M.D.   On:  02/14/2024 10:32   MR LUMBAR SPINE WO CONTRAST Result Date: 02/14/2024 CLINICAL DATA:  Low back pain extending down the right leg. EXAM: MRI LUMBAR SPINE WITHOUT CONTRAST TECHNIQUE: Multiplanar, multisequence MR imaging of the lumbar spine was performed. No intravenous contrast was administered. COMPARISON:  12/01/2021 FINDINGS: Segmentation:  Standard. Alignment:  Physiologic. Vertebrae: No fracture, evidence of discitis, or bone lesion. Discogenic endplate edema posteriorly at the L4-5 disc space, mainly localized at the base of endplate osteophytes, interval. Conus medullaris and cauda equina: Conus extends to the L2 level. Conus and cauda equina appear normal. Paraspinal and other soft tissues: No perispinal mass or inflammation seen Disc levels: T12- L1: Unremarkable. L1-L2: Unremarkable. L2-L3: Unremarkable. L3-L4: Mild disc desiccation and narrowing with minor foraminal bulging. A right foraminal protrusion on prior has resolved. No neural impingement L4-L5: Disc narrowing and bulging is similar to prior, although there is now the endplate edema described above. Degenerative facet spurring is stable. No neural compression. L5-S1:Disc narrowing and bulging with fatty degenerative marrow conversion. Endplate and facet spurring is mild. No neural compression. IMPRESSION: Lower lumbar disc degeneration without neural compression. Since 2022 comparison there is mild discogenic endplate edema at Z6-1 and regression of a right foraminal herniation at L3-4. Electronically Signed   By: Tiburcio Pea M.D.   On: 02/14/2024 08:56   MR KNEE RIGHT W WO CONTRAST Result Date: 02/09/2024 CLINICAL DATA:  Right lower leg pain and swelling. EXAM: MRI OF THE RIGHT KNEE WITHOUT AND WITH CONTRAST TECHNIQUE: Multiplanar, multisequence MR imaging of the right knee was performed both before and after administration of intravenous contrast. CONTRAST:  7.37mL GADAVIST GADOBUTROL 1 MMOL/ML IV SOLN COMPARISON:  None Available.  FINDINGS: MENISCI Medial: Horizontal tear of the posterior horn medial meniscus extending to the free edge. Lateral: Intact. LIGAMENTS Cruciates: ACL and PCL are intact. Collaterals: Medial collateral ligament is intact. Lateral collateral ligament complex is intact. CARTILAGE Patellofemoral: Moderate partial-thickness cartilage loss of the medial patellar facet and superior aspect of the medial trochlea. Medial: Moderate partial most cartilage loss of the weight-bearing medial femorotibial compartment. Lateral: Mild partial-thickness cartilage loss of the central lateral tibial plateau. JOINT: No joint effusion. Normal Hoffa's fat-pad. No plical thickening. POPLITEAL FOSSA: Popliteus tendon is intact. No Baker's cyst. EXTENSOR MECHANISM: Intact quadriceps tendon. Intact patellar tendon. Intact lateral patellar retinaculum. Intact medial patellar retinaculum. Intact MPFL. BONES: No aggressive osseous lesion. No fracture or dislocation. Other: No fluid collection or hematoma. Muscles are normal. Neurovascular bundles are normal. Peroneal nerve is normal. IMPRESSION: 1. Horizontal tear of the posterior horn medial meniscus extending to the free edge. 2. Moderate partial-thickness cartilage loss of the medial patellar facet and superior aspect of the medial trochlea. 3. Moderate partial most cartilage loss of the weight-bearing medial femorotibial compartment. 4. Mild partial-thickness cartilage loss of the central lateral tibial plateau. Electronically Signed   By: Elige Ko M.D.   On: 02/09/2024 16:50   ECHOCARDIOGRAM COMPLETE Result Date: 02/01/2024    ECHOCARDIOGRAM REPORT   Patient Name:   NANCYLEE GAINES Date of Exam: 02/01/2024 Medical Rec #:  [id number]        Height:       62.0 in Accession #:    [id number]       Weight:       168.0 lb Date of Birth:  09-Jun-1966        BSA:  1.775 m Patient Age:    58 years         BP:           112/70 mmHg Patient Gender: F                HR:           63 bpm.  Exam Location:  [facility] Procedure: 2D Echo, Cardiac Doppler, Color Doppler and Strain Analysis (Both            Spectral and Color Flow Doppler were utilized during procedure). Indications:     Chemo  History:         Patient has prior history of Echocardiogram examinations, most                  recent 12/24/2018. Signs/Symptoms:Shortness of Breath. Chemo,                  Breast CA.  Sonographer:     Mikki Harbor Referring Phys:  [id number] Pete Glatter Zahlia Deshazer Diagnosing Phys: Julien Nordmann MD  Sonographer Comments: Global longitudinal strain was attempted. IMPRESSIONS  1. Left ventricular ejection fraction, by estimation, is 60 to 65%. The left ventricle has normal function. The left ventricle has no regional wall motion abnormalities. Left ventricular diastolic parameters are consistent with Grade I diastolic dysfunction (impaired relaxation). The average left ventricular global longitudinal strain is -17.2 %. The global longitudinal strain is normal.  2. Right ventricular systolic function is normal. The right ventricular size is normal.  3. The mitral valve is normal in structure. No evidence of mitral valve regurgitation. No evidence of mitral stenosis.  4. The aortic valve is tricuspid. Aortic valve regurgitation is not visualized. No aortic stenosis is present.  5. The inferior vena cava is normal in size with greater than 50% respiratory variability, suggesting right atrial pressure of 3 mmHg. FINDINGS  Left Ventricle: Left ventricular ejection fraction, by estimation, is 60 to 65%. The left ventricle has normal function. The left ventricle has no regional wall motion abnormalities. The average left ventricular global longitudinal strain is -17.2 %. Strain was performed and the global longitudinal strain is normal. The left ventricular internal cavity size was normal in size. There is no left ventricular hypertrophy. Left ventricular diastolic parameters are consistent with Grade I diastolic dysfunction (impaired  relaxation). Right Ventricle: The right ventricular size is normal. No increase in right ventricular wall thickness. Right ventricular systolic function is normal. Left Atrium: Left atrial size was normal in size. Right Atrium: Right atrial size was normal in size. Pericardium: There is no evidence of pericardial effusion. Mitral Valve: The mitral valve is normal in structure. No evidence of mitral valve regurgitation. No evidence of mitral valve stenosis. MV peak gradient, 3.4 mmHg. The mean mitral valve gradient is 1.0 mmHg. Tricuspid Valve: The tricuspid valve is normal in structure. Tricuspid valve regurgitation is not demonstrated. No evidence of tricuspid stenosis. Aortic Valve: The aortic valve is tricuspid. Aortic valve regurgitation is not visualized. No aortic stenosis is present. Aortic valve mean gradient measures 3.0 mmHg. Aortic valve peak gradient measures 6.4 mmHg. Aortic valve area, by VTI measures 2.29 cm. Pulmonic Valve: The pulmonic valve was normal in structure. Pulmonic valve regurgitation is trivial. No evidence of pulmonic stenosis. Aorta: The aortic root is normal in size and structure. Venous: The inferior vena cava is normal in size with greater than 50% respiratory variability, suggesting right atrial pressure of 3 mmHg. IAS/Shunts: No atrial level shunt detected  by color flow Doppler. Additional Comments: 3D imaging was not performed.  LEFT VENTRICLE PLAX 2D LVIDd:         4.10 cm     Diastology LVIDs:         2.80 cm     LV e' medial:    6.31 cm/s LV PW:         1.10 cm     LV E/e' medial:  12.5 LV IVS:        1.20 cm     LV e' lateral:   12.80 cm/s LVOT diam:     1.90 cm     LV E/e' lateral: 6.1 LV SV:         72 LV SV Index:   41          2D Longitudinal Strain LVOT Area:     2.84 cm    2D Strain GLS Avg:     -17.2 %  LV Volumes (MOD) LV vol d, MOD A2C: 48.8 ml LV vol d, MOD A4C: 55.9 ml LV vol s, MOD A2C: 22.3 ml LV vol s, MOD A4C: 25.4 ml LV SV MOD A2C:     26.5 ml LV SV MOD A4C:      55.9 ml LV SV MOD BP:      27.6 ml RIGHT VENTRICLE RV Basal diam:  3.15 cm RV Mid diam:    3.20 cm RV S prime:     10.10 cm/s TAPSE (M-mode): 2.6 cm LEFT ATRIUM             Index        RIGHT ATRIUM           Index LA diam:        3.10 cm 1.75 cm/m   RA Area:     13.40 cm LA Vol (A2C):   39.9 ml 22.48 ml/m  RA Volume:   28.60 ml  16.11 ml/m LA Vol (A4C):   35.9 ml 20.22 ml/m LA Biplane Vol: 38.4 ml 21.63 ml/m  AORTIC VALVE                    PULMONIC VALVE AV Area (Vmax):    2.36 cm     PV Vmax:       1.09 m/s AV Area (Vmean):   2.53 cm     PV Peak grad:  4.8 mmHg AV Area (VTI):     2.29 cm AV Vmax:           126.00 cm/s AV Vmean:          77.300 cm/s AV VTI:            0.315 m AV Peak Grad:      6.4 mmHg AV Mean Grad:      3.0 mmHg LVOT Vmax:         105.00 cm/s LVOT Vmean:        68.900 cm/s LVOT VTI:          0.254 m LVOT/AV VTI ratio: 0.81  AORTA Ao Root diam: 3.30 cm MITRAL VALVE MV Area (PHT): 4.44 cm    SHUNTS MV Area VTI:   2.20 cm    Systemic VTI:  0.25 m MV Peak grad:  3.4 mmHg    Systemic Diam: 1.90 cm MV Mean grad:  1.0 mmHg MV Vmax:       0.93 m/s MV Vmean:      54.5 cm/s MV Decel Time: 171 msec  MV E velocity: 78.60 cm/s MV A velocity: 99.00 cm/s MV E/A ratio:  0.79 Julien Nordmann MD Electronically signed by Julien Nordmann MD Signature Date/Time: 02/01/2024/4:47:45 PM    Final    NCV with EMG(electromyography) Result Date: 01/23/2024 Antony Madura, MD     01/23/2024  9:21 AM [facility] [address] [place], [address]  [place], [place] [address] Tel: [phone] Fax: [phone] Test Date:  01/23/2024 Patient: Chania Kochanski DOB: 1966/06/18 Physician: Jacquelyne Balint, MD Sex: Female Height: 5\' 2"  Ref Phys: Ernestine Mcmurray, MD ID#: [id number]   Technician:  History: This is a 58 year old female with pain in bilateral lower limbs. NCV & EMG Findings: Extensive electrodiagnostic evaluation of bilateral lower limb shows: Bilateral sural and superficial peroneal/fibular sensory responses are  within normal limits. Bilateral peroneal/fibular (EDB), peroneal/fibular (TA), and tibial (AH) motor responses are within normal limits. Bilateral H reflexes are absent. There is no evidence of active or chronic motor axon loss changes affecting any of the tested muscles. Motor unit configuration and recruitment pattern is within normal limits. Impression: This is a normal study. Specifically: No electrodiagnostic evidence of a large fiber sensorimotor neuropathy. No electrodiagnostic evidence of a left or right lumbosacral (L3-S1) motor radiculopathy. No electrodiagnostic evidence of a left or right peroneal/fibular mononeuropathy. Absent H reflexes bilaterally with an otherwise normal study is of unclear clinical significance and may be technical in nature. ___________________________ Jacquelyne Balint, MD Nerve Conduction Studies Motor Nerve Results   Latency Amplitude F-Lat Segment Distance CV Comment Site (ms) Norm (mV) Norm (ms)  (cm) (m/s) Norm  Left Fibular (EDB) Motor Ankle 3.6  < 6.0 7.2  > 2.5       Bel fib head 9.7 - 6.5 -  Bel fib head-Ankle 30 49  > 40  Pop fossa 11.5 - 6.3 -  Pop fossa-Bel fib head 9 50 -  Right Fibular (EDB) Motor Ankle 3.4  < 6.0 7.7  > 2.5       Bel fib head 9.7 - 7.1 -  Bel fib head-Ankle 30 48  > 40  Pop fossa 11.5 - 7.1 -  Pop fossa-Bel fib head 8 44 -  Left Fibular (TA) Motor Fib head 1.60  < 4.5 7.5  > 3.0       Pop fossa 3.2  < 6.7 6.8 -  Pop fossa-Fib head 9 56  > 40  Right Fibular (TA) Motor Fib head 2.0  < 4.5 7.6  > 3.0       Pop fossa 3.3  < 6.7 7.4 -  Pop fossa-Fib head 8 62  > 40  Left Tibial (AH) Motor Ankle 3.3  < 6.0 12.2  > 4.0       Knee 10.9 - 10.7 -  Knee-Ankle 37 49  > 40  Right Tibial (AH) Motor Ankle 4.2  < 6.0 11.3  > 4.0       Knee 11.7 - 8.6 -  Knee-Ankle 39 52  > 40  Sensory Sites   Neg Peak Lat Amplitude (O-P) Segment Distance Velocity Comment Site (ms) Norm (V) Norm  (cm) (ms)  Left Superficial Fibular Sensory 14 cm-Ankle 2.8  < 4.6 9  > 3 14 cm-Ankle 14    Right Superficial Fibular Sensory 14 cm-Ankle 2.2  < 4.6 6  > 3 14 cm-Ankle 14   Left Sural Sensory Calf-Lat mall 3.3  < 4.6 10  > 3 Calf-Lat mall 14   Right Sural Sensory Calf-Lat mall 3.0  <  4.6 10  > 3 Calf-Lat mall 12   H-Reflex Results   M-Lat H Lat H Neg Amp H-M Lat Site (ms) (ms) Norm (mV) (ms) Left Tibial H-Reflex Pop fossa 6.4 -  < 35.0 - - Right Tibial H-Reflex Pop fossa 5.4 -  < 35.0 - - Electromyography  Side Muscle Ins.Act Fibs Fasc Recrt Amp Dur Poly Activation Comment Left Tib ant Nml Nml Nml Nml Nml Nml Nml Nml N/A Left Gastroc MH Nml Nml Nml Nml Nml Nml Nml Nml N/A Left Fib longus Nml Nml Nml Nml Nml Nml Nml Nml N/A Left Rectus fem Nml Nml Nml Nml Nml Nml Nml Nml N/A Left Biceps fem SH Nml Nml Nml Nml Nml Nml Nml Nml N/A Left Gluteus med Nml Nml Nml Nml Nml Nml Nml Nml N/A Right Tib ant Nml Nml Nml Nml Nml Nml Nml Nml N/A Right Gastroc MH Nml Nml Nml Nml Nml Nml Nml Nml N/A Right Fib longus Nml Nml Nml Nml Nml Nml Nml Nml N/A Right Rectus fem Nml Nml Nml Nml Nml Nml Nml Nml N/A Right Biceps fem SH Nml Nml Nml Nml Nml Nml Nml Nml N/A Right Gluteus med Nml Nml Nml Nml Nml Nml Nml Nml N/A Waveforms: Motor           Sensory       H-Reflex        Assessment and plan- Patient is a 58 y.o. female with history of stage I left breast cancer ER/PR positive HER2 negative s/p adjuvant chemotherapy and radiation.  She is here to discuss CT scan results and further management  Given her symptoms of exertional shortness of breath I had obtained CT chest without contrast as well as echocardiogram.  Echocardiogram shows normal ejection fraction and no wall motionAbnormalities.  No major valvular heart disease.  Grade 1 diastolic dysfunction.  CT chest without contrast also did not show any evidence of malignancy or infection.  Postsurgical and radiation changes in the left breast.  There is no clear etiology discernible based on these tests.  I have asked her to discuss with her primary care doctor further and  if symptoms persist if she would need additional evaluation such as stress testing  I will see her next month for dose 5 out of 6 of Zometa.  She will continue exemestane for her breast cancer   Visit Diagnosis 1. Encounter for follow-up surveillance of breast cancer   2. Use of exemestane (Aromasin)      Dr. Owens Shark, MD, MPH [facility] at [facility] [phone] 02/15/2024 10:37 AM

## 2024-02-19 ENCOUNTER — Ambulatory Visit (INDEPENDENT_AMBULATORY_CARE_PROVIDER_SITE_OTHER): Admitting: Neurosurgery

## 2024-02-19 DIAGNOSIS — M545 Low back pain, unspecified: Secondary | ICD-10-CM

## 2024-02-19 DIAGNOSIS — G620 Drug-induced polyneuropathy: Secondary | ICD-10-CM

## 2024-02-19 DIAGNOSIS — M47896 Other spondylosis, lumbar region: Secondary | ICD-10-CM

## 2024-02-19 NOTE — Progress Notes (Signed)
 Today I had a follow-up phone call with Ms. Vahle.  She was in a private setting and I was in clinic.  She gave consent to go forward to discuss her MRIs, EMGs, and plans going forward regarding her lower extremity issues.  She does state that her bilateral lower extremities feel somewhat like she has compression socks on, she has not noticed any new progressive weakness any new progressive numbness or any new or progressive changes in her strength function or pain.  As part of the workup we obtained lumbar spine MRI which shows diffuse spondylosis and some small herniations that we discussed with her.  None of these would clearly explain her symptoms.  Also in the setting of her recent EMG test which she did not show [place] any evidence of a radiculopathy this further supports the fact that this is not likely coming from her lumbar spinal nerves.  She was previously walking about 5 miles a day, however after her cancer diagnosis she is only gotten back up to 2 miles a day as she is worried she was hurting something given her lower extremity changes.  It is possible that she has some neuropathy from her previous treatment that may be causing some subclinical changes in her lower extremities that was not able to be picked up on the EMG.  We did state that with her MRI of her spine as well as the MRI of her lower extremity evaluating for cysts we did not see anything compressing her nerves in these distributions.  In that we do not see any contraindication her increasing her walking.  She would like to continue to follow-up as needed.  We let her know she could reach out at any time for reestablishing care or further workup.  We spent a total of 10 minutes discussing her care.

## 2024-02-22 ENCOUNTER — Ambulatory Visit: Payer: Commercial Managed Care - PPO

## 2024-02-26 ENCOUNTER — Ambulatory Visit: Payer: Self-pay | Admitting: Family Medicine

## 2024-02-26 ENCOUNTER — Ambulatory Visit: Admitting: Family Medicine

## 2024-02-26 ENCOUNTER — Other Ambulatory Visit: Payer: Self-pay

## 2024-02-26 VITALS — BP 89/65 | Ht 62.0 in | Wt 171.0 lb

## 2024-02-26 DIAGNOSIS — M79609 Pain in unspecified limb: Secondary | ICD-10-CM | POA: Diagnosis not present

## 2024-02-26 DIAGNOSIS — R202 Paresthesia of skin: Secondary | ICD-10-CM | POA: Diagnosis not present

## 2024-02-26 DIAGNOSIS — F4311 Post-traumatic stress disorder, acute: Secondary | ICD-10-CM | POA: Diagnosis not present

## 2024-02-26 DIAGNOSIS — F419 Anxiety disorder, unspecified: Secondary | ICD-10-CM | POA: Diagnosis not present

## 2024-02-26 MED ORDER — PREGABALIN 75 MG PO CAPS
75.0000 mg | ORAL_CAPSULE | Freq: Two times a day (BID) | ORAL | 1 refills | Status: DC
  Filled 2024-02-26: qty 180, 90d supply, fill #0

## 2024-02-26 NOTE — Patient Instructions (Signed)
 Medication as prescribed.  If you continue to have issues, please let me know.

## 2024-02-26 NOTE — Telephone Encounter (Signed)
 ommunicationRed Word that prompted transfer to Nurse Triage: Pain in legs, getting progressively worse,    Chief Complaint: Bilateral leg pain. Symptoms: Pain Frequency: "For years but getting worse." Pertinent Negatives: Patient denies  Disposition: [] ED /[] Urgent Care (no appt availability in office) / [x] Appointment(In office/virtual)/ []  [place] [facility]/ [] Home Care/ [] Refused Recommended Disposition /[] [place] [facility]/ []  Follow-up with PCP Additional Notes: Agrees with appointment.  Reason for Disposition  [1] SEVERE pain (e.g., excruciating, unable to do any normal activities) AND [2] not improved after 2 hours of pain medicine  Answer Assessment - Initial Assessment Questions 1. ONSET: "When did the pain start?"      Years, but getting worse 2. LOCATION: "Where is the pain located?"      More right leg 3. PAIN: "How bad is the pain?"    (Scale 1-10; or mild, moderate, severe)   -  MILD (1-3): doesn't interfere with normal activities    -  MODERATE (4-7): interferes with normal activities (e.g., work or school) or awakens from sleep, limping    -  SEVERE (8-10): excruciating pain, unable to do any normal activities, unable to walk     8 4. WORK OR EXERCISE: "Has there been any recent work or exercise that involved this part of the body?"      No 5. CAUSE: "What do you think is causing the leg pain?"     Nerve 6. OTHER SYMPTOMS: "Do you have any other symptoms?" (e.g., chest pain, back pain, breathing difficulty, swelling, rash, fever, numbness, weakness)     SOB,  7. PREGNANCY: "Is there any chance you are pregnant?" "When was your last menstrual period?"     No  Protocols used: Leg Pain-A-AH

## 2024-02-26 NOTE — Assessment & Plan Note (Signed)
 Etiology and prognosis unclear at this time.  She has had extensive workup which has been reassuring/negative.  This problem is currently uncontrolled and resulting in significant pain.  I do not feel that this is a vascular issue.  Trial of Lyrica.

## 2024-02-26 NOTE — Progress Notes (Signed)
 Subjective:  Patient ID: Nichole Brown, female    DOB: 1966/07/01  Age: 58 y.o. MRN: [id number]  CC:   Numbness, pain in the right lower leg  HPI:  58 year old female presents for evaluation of the above.  Patient reports ongoing issues with numbness and pain in her right lower extremity.  She states that is located just lateral to the anterior tibial region and also affects the right great toe.  She has been seen by neurosurgery and has had MRI imaging of the lumbar spine and also the knee.  She has also had a nerve conduction study.  Neurosurgery does not feel that this is coming from the lumbar spine and she had a normal nerve conduction study.  Patient is quite troubled by this and states that it interferes with sleep.  She is at her wits end regarding what this is and how to make this better.  Patient Active Problem List   Diagnosis Date Noted   Paresthesia and pain of right extremity 02/26/2024   Neuropathy due to chemotherapeutic drug (HCC) 01/15/2024   Arthralgia of multiple sites 08/04/2023   Malignant neoplasm of upper-outer quadrant of left breast in female, estrogen receptor positive (HCC) 08/29/2022   Depression, major, single episode, moderate (HCC) 01/01/2019   Generalized anxiety disorder 01/01/2019   Gastroesophageal reflux disease 12/26/2016   Migraine headache 07/24/2013   Insomnia 05/30/2013   Fibromyalgia 05/30/2013    Social Hx   Social History   Socioeconomic History   Marital status: Married    Spouse name: daniel   Number of children: 4   Years of education: Not on file   Highest education level: Associate degree: occupational, Scientist, product/process development, or vocational program  Occupational History   Not on file  Tobacco Use   Smoking status: Never   Smokeless tobacco: Never  Vaping Use   Vaping status: Never Used  Substance and Sexual Activity   Alcohol use: Not Currently    Comment: occassionally-not since chemo started   Drug use: No   Sexual activity:  Yes    Birth control/protection: Surgical    Comment: hyst  Other Topics Concern   Not on file  Social History Narrative   Not on file   Social Drivers of Health   Financial Resource Strain: Low Risk  (10/20/2023)   Overall Financial Resource Strain (CARDIA)    Difficulty of Paying Living Expenses: Not hard at all  Food Insecurity: No Food Insecurity (10/20/2023)   Hunger Vital Sign    Worried About Running Out of Food in the Last Year: Never true    Ran Out of Food in the Last Year: Never true  Transportation Needs: No Transportation Needs (10/20/2023)   PRAPARE - Administrator, Civil Service (Medical): No    Lack of Transportation (Non-Medical): No  Physical Activity: Sufficiently Active (10/20/2023)   Exercise Vital Sign    Days of Exercise per Week: 7 days    Minutes of Exercise per Session: 30 min  Stress: Stress Concern Present (10/20/2023)   [employer] of Occupational Health - Occupational Stress Questionnaire    Feeling of Stress : To some extent  Social Connections: Socially Integrated (10/20/2023)   Social Connection and Isolation Panel [NHANES]    Frequency of Communication with Friends and Family: Twice a week    Frequency of Social Gatherings with Friends and Family: Three times a week    Attends Religious Services: More than 4 times per year    Active  Member of Clubs or Organizations: Yes    Attends Engineer, structural: More than 4 times per year    Marital Status: Married    Review of Systems Per HPI  Objective:  BP (!) 89/65   Ht 5\' 2"  (1.575 m)   Wt 171 lb (77.6 kg)   BMI 31.28 kg/m      02/26/2024    3:21 PM 02/14/2024    3:13 PM 02/06/2024    9:48 AM  BP/Weight  Systolic BP 89 103 104  Diastolic BP 65 67 71  Wt. (Lbs) 171  168  BMI 31.28 kg/m2  30.73 kg/m2    Physical Exam Constitutional:      General: She is not in acute distress.    Appearance: Normal appearance.  HENT:     Head: Normocephalic and atraumatic.   Cardiovascular:     Rate and Rhythm: Normal rate and regular rhythm.  Pulmonary:     Effort: Pulmonary effort is normal.     Breath sounds: Normal breath sounds. No wheezing, rhonchi or rales.  Musculoskeletal:     Comments: Right lower extremity with 2+ DP and PT pulses.  Neurological:     Mental Status: She is alert.     Lab Results  Component Value Date   WBC 7.8 01/08/2024   HGB 12.8 01/08/2024   HCT 38.4 01/08/2024   PLT 230 01/08/2024   GLUCOSE 99 01/08/2024   CHOL 175 05/22/2023   TRIG 282 (H) 05/22/2023   HDL 46 05/22/2023   LDLCALC 73 05/22/2023   ALT 20 01/08/2024   AST 15 01/08/2024   NA 140 01/08/2024   K 4.0 01/08/2024   CL 108 01/08/2024   CREATININE 0.90 01/08/2024   BUN 18 01/08/2024   CO2 24 01/08/2024   TSH 2.206 10/29/2021   HGBA1C 6.0 (H) 05/22/2023     Assessment & Plan:  Paresthesia and pain of right extremity Assessment & Plan: Etiology and prognosis unclear at this time.  She has had extensive workup which has been reassuring/negative.  This problem is currently uncontrolled and resulting in significant pain.  I do not feel that this is a vascular issue.  Trial of Lyrica.   Other orders -     Pregabalin; Take 1 capsule (75 mg total) by mouth 2 (two) times daily.  Dispense: 180 capsule; Refill: 1    Follow-up:  Return if symptoms worsen or fail to improve.  Everlene Other DO [facility]

## 2024-02-27 ENCOUNTER — Telehealth: Payer: Self-pay | Admitting: *Deleted

## 2024-02-27 ENCOUNTER — Other Ambulatory Visit: Payer: Self-pay

## 2024-02-27 NOTE — Telephone Encounter (Signed)
 The patient called today about exemestane and lyrica. She is gaining wt. And she feels she gets SOB and she want to know that is the exemestane or lyrica can make it worse, she is at 171 lbs. She drinks 16 oz. Water 8 a day. She keeps having issues with the right leg and PCP put her on lyrica . She still has low B/P all the time but drinking water helps She would like your wisdom for the issues she is having

## 2024-02-29 NOTE — Telephone Encounter (Signed)
 I called her

## 2024-03-01 ENCOUNTER — Encounter: Payer: Self-pay | Admitting: Oncology

## 2024-03-08 ENCOUNTER — Other Ambulatory Visit: Payer: Self-pay | Admitting: Oncology

## 2024-03-11 ENCOUNTER — Other Ambulatory Visit: Payer: Self-pay

## 2024-03-11 ENCOUNTER — Other Ambulatory Visit: Payer: Self-pay | Admitting: Oncology

## 2024-03-11 DIAGNOSIS — F4311 Post-traumatic stress disorder, acute: Secondary | ICD-10-CM | POA: Diagnosis not present

## 2024-03-11 DIAGNOSIS — F419 Anxiety disorder, unspecified: Secondary | ICD-10-CM | POA: Diagnosis not present

## 2024-03-12 ENCOUNTER — Other Ambulatory Visit: Payer: Self-pay

## 2024-03-12 ENCOUNTER — Other Ambulatory Visit: Payer: Self-pay | Admitting: Oncology

## 2024-03-13 ENCOUNTER — Other Ambulatory Visit: Payer: Self-pay

## 2024-03-14 ENCOUNTER — Other Ambulatory Visit: Payer: Self-pay

## 2024-03-14 ENCOUNTER — Encounter: Payer: Self-pay | Admitting: Oncology

## 2024-03-14 ENCOUNTER — Other Ambulatory Visit: Payer: Self-pay | Admitting: Oncology

## 2024-03-14 DIAGNOSIS — Z79811 Long term (current) use of aromatase inhibitors: Secondary | ICD-10-CM

## 2024-03-14 MED FILL — Exemestane Tab 25 MG: ORAL | 30 days supply | Qty: 30 | Fill #0 | Status: AC

## 2024-03-14 MED FILL — Exemestane Tab 25 MG: ORAL | 30 days supply | Qty: 30 | Fill #0 | Status: CN

## 2024-03-18 ENCOUNTER — Encounter: Payer: Self-pay | Admitting: Nurse Practitioner

## 2024-03-18 ENCOUNTER — Inpatient Hospital Stay: Payer: Commercial Managed Care - PPO | Attending: Oncology | Admitting: *Deleted

## 2024-03-18 ENCOUNTER — Other Ambulatory Visit: Payer: Self-pay

## 2024-03-18 ENCOUNTER — Inpatient Hospital Stay (HOSPITAL_BASED_OUTPATIENT_CLINIC_OR_DEPARTMENT_OTHER): Admitting: Nurse Practitioner

## 2024-03-18 DIAGNOSIS — K219 Gastro-esophageal reflux disease without esophagitis: Secondary | ICD-10-CM | POA: Insufficient documentation

## 2024-03-18 DIAGNOSIS — N760 Acute vaginitis: Secondary | ICD-10-CM

## 2024-03-18 DIAGNOSIS — B9689 Other specified bacterial agents as the cause of diseases classified elsewhere: Secondary | ICD-10-CM | POA: Diagnosis not present

## 2024-03-18 DIAGNOSIS — G4700 Insomnia, unspecified: Secondary | ICD-10-CM | POA: Insufficient documentation

## 2024-03-18 DIAGNOSIS — Z79811 Long term (current) use of aromatase inhibitors: Secondary | ICD-10-CM | POA: Insufficient documentation

## 2024-03-18 DIAGNOSIS — Z9221 Personal history of antineoplastic chemotherapy: Secondary | ICD-10-CM | POA: Insufficient documentation

## 2024-03-18 DIAGNOSIS — C50412 Malignant neoplasm of upper-outer quadrant of left female breast: Secondary | ICD-10-CM | POA: Insufficient documentation

## 2024-03-18 DIAGNOSIS — Z170 Estrogen receptor positive status [ER+]: Secondary | ICD-10-CM | POA: Insufficient documentation

## 2024-03-18 DIAGNOSIS — Z801 Family history of malignant neoplasm of trachea, bronchus and lung: Secondary | ICD-10-CM | POA: Insufficient documentation

## 2024-03-18 DIAGNOSIS — Z79899 Other long term (current) drug therapy: Secondary | ICD-10-CM | POA: Insufficient documentation

## 2024-03-18 DIAGNOSIS — Z9189 Other specified personal risk factors, not elsewhere classified: Secondary | ICD-10-CM

## 2024-03-18 DIAGNOSIS — Z800 Family history of malignant neoplasm of digestive organs: Secondary | ICD-10-CM | POA: Insufficient documentation

## 2024-03-18 MED ORDER — METRONIDAZOLE 500 MG PO TABS
500.0000 mg | ORAL_TABLET | Freq: Two times a day (BID) | ORAL | 0 refills | Status: AC
  Filled 2024-03-18: qty 14, 7d supply, fill #0

## 2024-03-18 NOTE — Progress Notes (Signed)
 SUBJECTIVE: Pt returns for her 3 month L-Dex screen.    PAIN:  Are you having pain? No   SOZO SCREENING: Patient was assessed today using the SOZO machine to determine the lymphedema index score. This was compared to her baseline score. It was determined that she is within the recommended range when compared to her baseline and no further action is needed at this time. She will continue SOZO screenings. These are done every 3 months for 2 years post operatively followed by every 6 months for 2 years, and then annually.     L-DEX FLOWSHEETS                L-DEX LYMPHEDEMA SCREENING    Measurement Type Unilateral     L-DEX MEASUREMENT EXTREMITY Upper Extremity     POSITION  Standing     DOMINANT SIDE Right     At Risk Side Left     BASELINE SCORE (UNILATERAL) -3.9    L-DEX SCORE (UNILATERAL) -7.7    VALUE CHANGE (UNILAT) -3.8

## 2024-03-18 NOTE — Progress Notes (Signed)
 Virtual Visit Progress Note  Symptom Management Clinic  [facility] at [facility] A Department of the [place]. [facility] [address], [address] [place], [place] [address] [phone] (phone) [phone] (fax)  I connected with Nichole Brown on 03/18/24 at  3:30 PM EDT by video enabled telemedicine visit and verified that I am speaking with the correct person using two identifiers.   I discussed the limitations, risks, security and privacy concerns of performing an evaluation and management service by telemedicine and the availability of in-person appointments. I also discussed with the patient that there may be a patient responsible charge related to this service. The patient expressed understanding and agreed to proceed.   Other persons participating in the visit and their role in the encounter: none   Patient's location: car  Provider's location: clinic     Patient Care Team: Nichole Sams, DO as PCP - General (Family Medicine) Nichole Bali, MD (Inactive) as Consulting Physician (Gastroenterology) Nichole Luster, RN as Oncology Nurse Navigator Nichole Miller, MD as Consulting Physician (Radiation Oncology) Nichole Hines, MD as Consulting Physician (Oncology) Nichole Loron, MD as Consulting Physician (General Surgery)   Name of the patient: Nichole Brown  [id number]  Oct 02, 1966   Date of visit: 03/18/24  Diagnosis- History of breast cancer  Chief complaint/ Reason for visit- vaginal odor  Heme/Onc history:  Oncology History  Malignant neoplasm of upper-outer quadrant of left breast in female, estrogen receptor positive (HCC)  08/29/2022 Initial Diagnosis   Malignant neoplasm of upper-outer quadrant of left breast in female, estrogen receptor positive (HCC)   08/29/2022 Cancer Staging   Staging form: Breast, AJCC 8th Edition - Clinical stage from 08/29/2022: Stage IA (cT1b, cN0, cM0, G1, ER+, PR+, HER2-) - Signed by  Nichole Hines, MD on 08/29/2022 Stage prefix: Initial diagnosis Histologic grading system: 3 grade system    Genetic Testing   Negative genetic testing. No pathogenic variants identified on the [facility] CancerNext-Expanded+RNA panel. The report date is 09/16/2022.  The CancerNext-Expanded + RNAinsight gene panel offered by [employer] and includes sequencing and rearrangement analysis for the following 77 genes: IP, ALK, APC*, ATM*, AXIN2, BAP1, BARD1, BLM, BMPR1A, BRCA1*, BRCA2*, BRIP1*, CDC73, CDH1*,CDK4, CDKN1B, CDKN2A, CHEK2*, CTNNA1, DICER1, FANCC, FH, FLCN, GALNT12, KIF1B, LZTR1, MAX, MEN1, MET, MLH1*, MSH2*, MSH3, MSH6*, MUTYH*, NBN, NF1*, NF2, NTHL1, PALB2*, PHOX2B, PMS2*, POT1, PRKAR1A, PTCH1, PTEN*, RAD51C*, RAD51D*,RB1, RECQL, RET, SDHA, SDHAF2, SDHB, SDHC, SDHD, SMAD4, SMARCA4, SMARCB1, SMARCE1, STK11, SUFU, TMEM127, TP53*,TSC1, TSC2, VHL and XRCC2 (sequencing and deletion/duplication); EGFR, EGLN1, HOXB13, KIT, MITF, PDGFRA, POLD1 and POLE (sequencing only); EPCAM and GREM1 (deletion/duplication only).   10/07/2022 Cancer Staging   Staging form: Breast, AJCC 8th Edition - Pathologic stage from 10/07/2022: Stage IA (pT1b, pN0, cM0, G2, ER+, PR+, HER2-) - Signed by Nichole Hines, MD on 10/07/2022 Multigene prognostic tests performed: Oncotype DX Histologic grading system: 3 grade system   11/11/2022 - 01/16/2023 Chemotherapy   Patient is on Treatment Plan : BREAST TC q21d       Interval history- Patient is 58 year old female who agrees to telemedicine visit for concerns of vaginal odor. She says for the past week she's noticed fishy vaginal odor. She has been using topical moisturizers with good improvement in vaginal dryness. Says she has not noticed a discharge but vaginal tissue feels irritated. No bleeding or spotting. She otherwise denies complaints.   Review of systems- Review of Systems  Constitutional:  Negative for  fever and malaise/fatigue.  Gastrointestinal:  Negative for  abdominal pain, constipation and diarrhea.  Genitourinary:  Negative for dysuria, frequency, hematuria and urgency.     Allergies  Allergen Reactions   Cymbalta [Duloxetine Hcl] Other (See Comments)    Hallucinations; sleep walking   Nortriptyline Nausea And Vomiting   Phenergan [Promethazine Hcl]     Restless legs and cramping    Past Medical History:  Diagnosis Date   Anxiety    Bronchitis    Cancer (HCC) 09/2022   left breast IMC   Complication of anesthesia    PONV   Contusion of leg 10/06/2015   Depression    GERD (gastroesophageal reflux disease)    Insomnia    Migraine headache    Pneumonia    2000   PONV (postoperative nausea and vomiting)     Past Surgical History:  Procedure Laterality Date   ABDOMINAL HYSTERECTOMY     BALLOON DILATION N/A 12/14/2020   Procedure: BALLOON DILATION;  Surgeon: Lanelle Bal, DO;  [facility];  Service: Endoscopy;  Laterality: N/A;   BIOPSY  12/14/2020   Procedure: BIOPSY;  Surgeon: Lanelle Bal, DO;  [facility]: [facility];  Service: Endoscopy;;   BREAST BIOPSY Left 08/24/2022   [place] bx, coil marker +   BREAST LUMPECTOMY WITH RADIOACTIVE SEED AND SENTINEL LYMPH NODE BIOPSY Left 09/29/2022   Invasive ductal carcinoma, grade 2, pT1b, pN0  - Margins are negative for carcinoma in situ or invasive carcinoma   CESAREAN SECTION     CHOLECYSTECTOMY  1988   COLONOSCOPY N/A 12/10/2018    six 4-8 mm polyps in proximal descending colon, mid transverse and hepatic flexure. Diverticulosis. External and internal hemorrhoids. Torturous colon. Two simple adenomas, two serrated polyps, 2 hyperplastic polyps. Surveillance due Dec 2022.    ESOPHAGOGASTRODUODENOSCOPY (EGD) WITH PROPOFOL N/A 12/14/2020   Benign-appearing esophageal stenosis s/p dilation, gastritis, normal duodenum. Mild chronic gastritis.    open heart surgery  1967   PATENT DUCTUS ARTERIOUS REPAIR  1967   POLYPECTOMY  12/10/2018   Procedure: POLYPECTOMY;   Surgeon: Nichole Bali, MD;  Location: [facility];  Service: Endoscopy;;  hepatic flexure, descending, transverse   PORT-A-CATH REMOVAL Right 03/14/2023   Procedure: REMOVAL PORT-A-CATH;  Surgeon: Nichole Loron, MD;  Location: [facility];  Service: General;  Laterality: Right;   PORTACATH PLACEMENT N/A 11/14/2022   Procedure: PORT PLACEMENT WITH ULTRASOUND GUIDANCE;  Surgeon: Nichole Loron, MD;  Location: [facility];  Service: General;  Laterality: N/A;   SHOULDER ARTHROSCOPY Left 06/16/2017   Procedure: LEFT SHOULDER ARTHROSCOPY, DEBRIDEMENT, AND DECOMPRESSION;  Surgeon: Nadara Mustard, MD;  Location: [facility];  Service: Orthopedics;  Laterality: Left;   SHOULDER ARTHROSCOPY WITH SUBACROMIAL DECOMPRESSION Left 10/03/2019   Procedure: SHOULDER ARTHROSCOPY WITH DEBRIDEMENT, DECOMPRESSION;  Surgeon: Christena Flake, MD;  Location: [facility];  Service: Orthopedics;  Laterality: Left;   SHOULDER SURGERY Left 2008   [facility]    Social History   Socioeconomic History   Marital status: Married    Spouse name: daniel   Number of children: 4   Years of education: Not on file   Highest education level: Associate degree: occupational, Scientist, product/process development, or vocational program  Occupational History   Not on file  Tobacco Use   Smoking status: Never   Smokeless tobacco: Never  Vaping Use   Vaping status: Never Used  Substance and Sexual Activity   Alcohol use: Not Currently    Comment:  occassionally-not since chemo started   Drug use: No   Sexual activity: Yes    Birth control/protection: Surgical    Comment: hyst  Other Topics Concern   Not on file  Social History Narrative   Not on file   Social Drivers of Health   Financial Resource Strain: Low Risk  (10/20/2023)   Overall Financial Resource Strain (CARDIA)    Difficulty of Paying Living Expenses: Not hard at all  Food Insecurity: No Food Insecurity (10/20/2023)   Hunger Vital Sign    Worried About  Running Out of Food in the Last Year: Never true    Ran Out of Food in the Last Year: Never true  Transportation Needs: No Transportation Needs (10/20/2023)   PRAPARE - Administrator, Civil Service (Medical): No    Lack of Transportation (Non-Medical): No  Physical Activity: Sufficiently Active (10/20/2023)   Exercise Vital Sign    Days of Exercise per Week: 7 days    Minutes of Exercise per Session: 30 min  Stress: Stress Concern Present (10/20/2023)   [employer] of Occupational Health - Occupational Stress Questionnaire    Feeling of Stress : To some extent  Social Connections: Socially Integrated (10/20/2023)   Social Connection and Isolation Panel [NHANES]    Frequency of Communication with Friends and Family: Twice a week    Frequency of Social Gatherings with Friends and Family: Three times a week    Attends Religious Services: More than 4 times per year    Active Member of Clubs or Organizations: Yes    Attends Banker Meetings: More than 4 times per year    Marital Status: Married  Catering manager Violence: Not At Risk (07/12/2022)   Humiliation, Afraid, Rape, and Kick questionnaire    Fear of Current or Ex-Partner: No    Emotionally Abused: No    Physically Abused: No    Sexually Abused: No    Family History  Problem Relation Age of Onset   Breast cancer Mother 58   Hypertension Father    Colon cancer Paternal Uncle        dx 28s   Hypertension Maternal Grandmother    Diabetes Maternal Grandmother    Heart disease Maternal Grandmother 40       MI   Hypertension Maternal Grandfather    Throat cancer Maternal Grandfather 36     Current Outpatient Medications:    acetaminophen (TYLENOL) 500 MG tablet, Take 500 mg by mouth every 6 (six) hours as needed for moderate pain., Disp: , Rfl:    busPIRone (BUSPAR) 15 MG tablet, Take 1/2 tablet (7.5mg ) by mouth 2 times per day for 5 days then start 1 tablet (15mg ) by mouth 2 times per day (Patient  not taking: Reported on 02/14/2024), Disp: 42 tablet, Rfl: 0   Calcium-Magnesium-Vitamin D (CALCIUM 1200+D3 PO), Take 1 Capful by mouth daily., Disp: , Rfl:    cetirizine-pseudoephedrine (ZYRTEC-D) 5-120 MG tablet, Take 1 tablet by mouth 2 (two) times daily. (Patient not taking: Reported on 02/14/2024), Disp: 20 tablet, Rfl: 0   exemestane (AROMASIN) 25 MG tablet, Take 1 tablet (25 mg total) by mouth daily after breakfast., Disp: 30 tablet, Rfl: 3   exemestane (AROMASIN) 25 MG tablet, Take 1 tablet (25 mg total) by mouth daily after breakfast., Disp: 30 tablet, Rfl: 3   fluticasone (FLONASE) 50 MCG/ACT nasal spray, Place 2 sprays into both nostrils daily. (Patient not taking: Reported on 02/14/2024), Disp: 16 g, Rfl: 6  Lifitegrast (XIIDRA) 5 % SOLN, Instill 1 drop into both eyes twice a day (Patient not taking: Reported on 02/14/2024), Disp: 180 each, Rfl: 4   omega-3 acid ethyl esters (LOVAZA) 1 g capsule, Take by mouth 2 (two) times daily. (Patient not taking: Reported on 02/14/2024), Disp: , Rfl:    ondansetron (ZOFRAN-ODT) 4 MG disintegrating tablet, Take 1 tablet (4 mg total) by mouth every 8 (eight) hours as needed for Nausea migraine (Patient not taking: Reported on 02/14/2024), Disp: 20 tablet, Rfl: 11   pregabalin (LYRICA) 75 MG capsule, Take 1 capsule (75 mg total) by mouth 2 (two) times daily., Disp: 180 capsule, Rfl: 1   Probiotic Product (PROBIOTIC PO), Take 1 capsule by mouth daily. (Patient not taking: Reported on 02/14/2024), Disp: , Rfl:    rizatriptan (MAXALT-MLT) 10 MG disintegrating tablet, Take 1 tablet (10 mg total) by mouth once as needed (may repeat dose once in > 2 hrs) for up to 1 dose (Patient not taking: Reported on 02/14/2024), Disp: 10 tablet, Rfl: 11   topiramate (TOPAMAX) 25 MG tablet, Take 1 tablet (25 mg total) by mouth 2 (two) times daily., Disp: 60 tablet, Rfl: 11  Physical exam: Exam limited due to telemedicine  There were no vitals filed for this visit. Physical  Exam Pulmonary:     Effort: No respiratory distress.  Neurological:     Mental Status: She is alert and oriented to person, place, and time.     No results found.  Assessment and plan- Patient is a 58 y.o. female    Bacterial Vaginosis - given use of topical moisturizers, recommend oral antibiotic therapy. Start metronidazole  Vaginal Dryness- continue topical vaginal moisturizers. If recurrent bv may consider rotating products. Symptoms have improved.   Disposition:  Follow up with Dr Smith Robert as scheduled or rtc sooner if symptoms don't improve or worsen.   Visit Diagnosis 1. Bacterial vaginosis     Patient expressed understanding and was in agreement with this plan. She also understands that She can call clinic at any time with any questions, concerns, or complaints.   I discussed the assessment and treatment plan with the patient. The patient was provided an opportunity to ask questions and all were answered. The patient agreed with the plan and demonstrated an understanding of the instructions.   The patient was advised to call back or seek an in-person evaluation if the symptoms worsen or if the condition fails to improve as anticipated.   I spent 10 minutes face-to-face video visit time dedicated to the care of this patient on the date of this encounter to include pre-visit review of my past note, face-to-face time with the patient, and post visit ordering of testing/documentation.   Thank you for allowing me to participate in the care of this very pleasant patient.   Consuello Masse, DNP, AGNP-C [facility]

## 2024-04-07 NOTE — Progress Notes (Unsigned)
 GI Office Note    Referring Provider: Cook, Nichole G, DO Primary Care Physician:  Brown, Nichole G, DO Primary Gastroenterologist: Nichole Brown. Nichole April, DO  Date:  04/08/2024  ID:  Nichole Brown, DOB Dec 07, 1966, MRN [id number]   Chief Complaint   Chief Complaint  Patient presents with   Follow-up    Follow up to be scheduled for a colonoscopy   History of Present Illness  Nichole Brown is a 58 y.o. female with a history of GERD, dysphagia, colon polyps, breast cancer, migraines, and anxiety/depression presenting today for visit prior to scheduling colonoscopy.   Colonoscopy Dec 2019: - six 4-8 mm polyps in the prox descending, mid transverse, and hepatic flexure -pan colonic diverticulosis -tortuous colon -internal and external hemorrhoids -Path: 2 simple adenomas, 2 serrated, 2 hyperplastic -Advised repeat in 3 years.   EGD Feb 2022: -benign appearing stenosis s/p dilation -gastritis (path - mild chronic gastritis) -normal duodenum  Last office visit 05/04/21. No upper or lower GI complaints. Dysphagia resolved. Taking Vitamin D . Advised to return in 6 months to arrange colonoscopy. PPI to decrease to once daily.   Today:  No issues with blood thinners. No new allergies. Had issues with hives during chemo treatment.   No constipation or diarrhea. Nausea is not daily but does happen here and there. Denies melena or brbpr. Stool itself has been dark at times but does admit to some mild toilet tissue hematochezia. This is infrequent. Usually gets pain, bleeding, and itching. If she stays hydrated she has more regular BM. Lots of dairy constipates her and then that flares up her hemorrhoids .  Has some mild GERD but nothing frequent and most diet dependent. Only thing she can take is tums or rolaids. During chemo she was not allowed to take pepto bismol. Took PI many years ago but nothing recent.   Able to tolerate prep without issue. She does get nauseas with undergoing and  sedation and anesthesia. Does have an allergy to phenergan  - restless leg issue.   Takes an oral chemo pill and does an infusion every 3 months for 2 years to ensure there is no recurrence but she only has 2 more left. Next one in May and then in August and should be done.   Mother with history of breast cancer as well. Done genetic testing as well.   Is a phlebotomist.   Wt Readings from Last 3 Encounters:  04/08/24 169 lb 9.6 oz (76.9 kg)  02/26/24 171 lb (77.6 kg)  02/06/24 168 lb (76.2 kg)    Current Outpatient Medications  Medication Sig Dispense Refill   acetaminophen  (TYLENOL ) 500 MG tablet Take 500 mg by mouth every 6 (six) hours as needed for moderate pain.     Calcium -Magnesium-Vitamin D  (CALCIUM  1200+D3 PO) Take 1 Capful by mouth daily.     exemestane  (AROMASIN ) 25 MG tablet Take 1 tablet (25 mg total) by mouth daily after breakfast. 30 tablet 3   exemestane  (AROMASIN ) 25 MG tablet Take 1 tablet (25 mg total) by mouth daily after breakfast. 30 tablet 3   Lifitegrast  (XIIDRA ) 5 % SOLN Instill 1 drop into both eyes twice a day 180 each 4   omega-3 acid ethyl esters (LOVAZA) 1 g capsule Take by mouth 2 (two) times daily.     ondansetron  (ZOFRAN -ODT) 4 MG disintegrating tablet Take 1 tablet (4 mg total) by mouth every 8 (eight) hours as needed for Nausea migraine 20 tablet 11   Probiotic Product (PROBIOTIC PO)  Take 1 capsule by mouth daily.     rizatriptan  (MAXALT -MLT) 10 MG disintegrating tablet Take 1 tablet (10 mg total) by mouth once as needed (may repeat dose once in > 2 hrs) for up to 1 dose 10 tablet 11   topiramate  (TOPAMAX ) 25 MG tablet Take 1 tablet (25 mg total) by mouth 2 (two) times daily. 60 tablet 11   busPIRone  (BUSPAR ) 15 MG tablet Take 1/2 tablet (7.5mg ) by mouth 2 times per day for 5 days then start 1 tablet (15mg ) by mouth 2 times per day (Patient not taking: Reported on 04/08/2024) 42 tablet 0   cetirizine -pseudoephedrine  (ZYRTEC -D) 5-120 MG tablet Take 1 tablet  by mouth 2 (two) times daily. (Patient not taking: Reported on 04/08/2024) 20 tablet 0   fluticasone  (FLONASE ) 50 MCG/ACT nasal spray Place 2 sprays into both nostrils daily. (Patient not taking: Reported on 04/08/2024) 16 g 6   pregabalin  (LYRICA ) 75 MG capsule Take 1 capsule (75 mg total) by mouth 2 (two) times daily. (Patient not taking: Reported on 04/08/2024) 180 capsule 1   No current facility-administered medications for this visit.    Past Medical History:  Diagnosis Date   Anxiety    Bronchitis    Cancer (HCC) 09/2022   left breast IMC   Complication of anesthesia    PONV   Contusion of leg 10/06/2015   Depression    GERD (gastroesophageal reflux disease)    Insomnia    Migraine headache    Pneumonia    2000   PONV (postoperative nausea and vomiting)     Past Surgical History:  Procedure Laterality Date   ABDOMINAL HYSTERECTOMY     age [age]   BALLOON DILATION N/A 12/14/2020   Procedure: BALLOON DILATION;  Surgeon: Nichole Greening, DO;  Location: [facility];  Service: Endoscopy;  Laterality: N/A;   BIOPSY  12/14/2020   Procedure: BIOPSY;  Surgeon: Nichole Greening, DO;  [facility]: [facility];  Service: Endoscopy;;   BREAST BIOPSY Left 08/24/2022   us  bx, coil marker +   BREAST LUMPECTOMY WITH RADIOACTIVE SEED AND SENTINEL LYMPH NODE BIOPSY Left 09/29/2022   Invasive ductal carcinoma, grade 2, pT1b, pN0  - Margins are negative for carcinoma in situ or invasive carcinoma   CESAREAN SECTION     CHOLECYSTECTOMY  1988   COLONOSCOPY N/A 12/10/2018    six 4-8 mm polyps in proximal descending colon, mid transverse and hepatic flexure. Diverticulosis. External and internal hemorrhoids. Torturous colon. Two simple adenomas, two serrated polyps, 2 hyperplastic polyps. Surveillance due Dec 2022.    ESOPHAGOGASTRODUODENOSCOPY (EGD) WITH PROPOFOL  N/A 12/14/2020   Benign-appearing esophageal stenosis s/p dilation, gastritis, normal duodenum. Mild chronic gastritis.    open  heart surgery  1967   PATENT DUCTUS ARTERIOUS REPAIR  1967   POLYPECTOMY  12/10/2018   Procedure: POLYPECTOMY;  Surgeon: Nichole Jubilee, MD;  [facility]: [facility];  Service: Endoscopy;;  hepatic flexure, descending, transverse   PORT-A-CATH REMOVAL Right 03/14/2023   Procedure: REMOVAL PORT-A-CATH;  Surgeon: Enid Harry, MD;  Location: [facility];  Service: General;  Laterality: Right;   PORTACATH PLACEMENT N/A 11/14/2022   Procedure: PORT PLACEMENT WITH ULTRASOUND GUIDANCE;  Surgeon: Enid Harry, MD;  Location: [facility];  Service: General;  Laterality: N/A;   SHOULDER ARTHROSCOPY Left 06/16/2017   Procedure: LEFT SHOULDER ARTHROSCOPY, DEBRIDEMENT, AND DECOMPRESSION;  Surgeon: Timothy Ford, MD;  Location: [facility];  Service: Orthopedics;  Laterality: Left;   SHOULDER  ARTHROSCOPY WITH SUBACROMIAL DECOMPRESSION Left 10/03/2019   Procedure: SHOULDER ARTHROSCOPY WITH DEBRIDEMENT, DECOMPRESSION;  Surgeon: Elner Hahn, MD;  Location: [facility];  Service: Orthopedics;  Laterality: Left;   SHOULDER SURGERY Left 2008   AC joint    Family History  Problem Relation Age of Onset   Breast cancer Mother 53   Hypertension Father    Colon cancer Paternal Uncle        dx 67s   Hypertension Maternal Grandmother    Diabetes Maternal Grandmother    Heart disease Maternal Grandmother 43       MI   Hypertension Maternal Grandfather    Throat cancer Maternal Grandfather 35   Allergies as of 04/08/2024 - Review Complete 04/08/2024  Allergen Reaction Noted   Cymbalta  [duloxetine  hcl] Other (See Comments) 02/09/2017   Nortriptyline  Nausea And Vomiting 02/09/2017   Phenergan  [promethazine  hcl]  12/10/2018    Social History   Socioeconomic History   Marital status: Married    Spouse name: daniel   Number of children: 4   Years of education: Not on file   Highest education level: Associate degree: occupational, Scientist, product/process development, or vocational program   Occupational History   Not on file  Tobacco Use   Smoking status: Never   Smokeless tobacco: Never  Vaping Use   Vaping status: Never Used  Substance and Sexual Activity   Alcohol use: Not Currently    Comment: occassionally-not since chemo started   Drug use: No   Sexual activity: Yes    Birth control/protection: Surgical    Comment: hyst  Other Topics Concern   Not on file  Social History Narrative   Not on file   Social Drivers of Health   Financial Resource Strain: Low Risk  (10/20/2023)   Overall Financial Resource Strain (CARDIA)    Difficulty of Paying Living Expenses: Not hard at all  Food Insecurity: No Food Insecurity (10/20/2023)   Hunger Vital Sign    Worried About Running Out of Food in the Last Year: Never true    Ran Out of Food in the Last Year: Never true  Transportation Needs: No Transportation Needs (10/20/2023)   PRAPARE - Administrator, Civil Service (Medical): No    Lack of Transportation (Non-Medical): No  Physical Activity: Sufficiently Active (10/20/2023)   Exercise Vital Sign    Days of Exercise per Week: 7 days    Minutes of Exercise per Session: 30 min  Stress: Stress Concern Present (10/20/2023)   [employer] of Occupational Health - Occupational Stress Questionnaire    Feeling of Stress : To some extent  Social Connections: Socially Integrated (10/20/2023)   Social Connection and Isolation Panel [NHANES]    Frequency of Communication with Friends and Family: Twice a week    Frequency of Social Gatherings with Friends and Family: Three times a week    Attends Religious Services: More than 4 times per year    Active Member of Clubs or Organizations: Yes    Attends Banker Meetings: More than 4 times per year    Marital Status: Married     Review of Systems   Gen: Denies fever, chills, anorexia. Denies fatigue, weakness, weight loss.  CV: Denies chest pain, palpitations, syncope, peripheral edema, and  claudication. Resp: Denies dyspnea at rest, cough, wheezing, coughing up blood, and pleurisy. GI: See HPI Derm: Denies rash, itching, dry skin Psych: Denies depression, anxiety, memory loss, confusion. No homicidal or suicidal ideation.  Heme: Denies bruising,  bleeding, and enlarged lymph nodes.  Physical Exam   BP 119/76   Pulse 64   Temp 98.7 F (37.1 C)   Ht 5\' 2"  (1.575 m)   Wt 169 lb 9.6 oz (76.9 kg)   BMI 31.02 kg/m   General:   Alert and oriented. No distress noted. Pleasant and cooperative.  Head:  Normocephalic and atraumatic. Eyes:  Conjuctiva clear without scleral icterus. Mouth:  Oral mucosa pink and moist. Good dentition. No lesions. Lungs:  Clear to auscultation bilaterally. No wheezes, rales, or rhonchi. No distress.  Heart:  S1, S2 present without murmurs appreciated.  Abdomen:  +BS, soft, non-tender and non-distended. No rebound or guarding. No HSM or masses noted. Rectal: deferred Msk:  Symmetrical without gross deformities. Normal posture. Extremities:  Without edema. Neurologic:  Alert and  oriented x4 Psych:  Alert and cooperative. Normal mood and affect.  Assessment  Nichole Brown is a 58 y.o. female with a history of GERD, dysphagia, colon polyps, breast cancer, migraines, and anxiety/depression presenting today to schedule surveillance colonoscopy.   History of colon polyps: Last colonoscopy in December 2019 with multiple polyps removed, simple adenomas, serrated adenomas, and hyperplastic polyps.  Was advised to repeat in 3 years, currently well overdue for surveillance.  No alarm symptoms present at this time.  Family history of colon cancer in paternal uncle, no other first-degree relatives.  Genetic counseling performed during her breast cancer workup, unrevealing.  GERD: Well controlled with dietary avoidance and tums as needed.   PLAN   Proceed with colonoscopy with propofol  by Dr. Mordechai Brown  in near future: the risks, benefits, and alternatives  have been discussed with the patient in detail. The patient states understanding and desires to proceed. ASA 2 Anesthesia FYI -commonly has postoperative nausea/vomiting, allergy to Phenergan . GERD diet as needed Continue Tums as needed Follow-up as needed  Julian Obey, MSN, FNP-BC, AGACNP-BC [facility]

## 2024-04-07 NOTE — H&P (View-Only) (Signed)
 GI Office Note    Referring Provider: Cook, Jayce G, DO Primary Care Physician:  Cook, Jayce G, DO Primary Gastroenterologist: Rolando Cliche. [name], DO  Date:  04/08/2024  ID:  Nichole Brown, DOB Dec 07, 1966, MRN [id number]   Chief Complaint   Chief Complaint  Patient presents with   Follow-up    Follow up to be scheduled for a colonoscopy   History of Present Illness  Nichole Brown is a 58 y.o. female with a history of GERD, dysphagia, colon polyps, breast cancer, migraines, and anxiety/depression presenting today for visit prior to scheduling colonoscopy.   Colonoscopy Dec 2019: - six 4-8 mm polyps in the prox descending, mid transverse, and hepatic flexure -pan colonic diverticulosis -tortuous colon -internal and external hemorrhoids -Path: 2 simple adenomas, 2 serrated, 2 hyperplastic -Advised repeat in 3 years.   EGD Feb 2022: -benign appearing stenosis s/p dilation -gastritis (path - mild chronic gastritis) -normal duodenum  Last office visit 05/04/21. No upper or lower GI complaints. Dysphagia resolved. Taking Vitamin D . Advised to return in 6 months to arrange colonoscopy. PPI to decrease to once daily.   Today:  No issues with blood thinners. No new allergies. Had issues with hives during chemo treatment.   No constipation or diarrhea. Nausea is not daily but does happen here and there. Denies melena or brbpr. Stool itself has been dark at times but does admit to some mild toilet tissue hematochezia. This is infrequent. Usually gets pain, bleeding, and itching. If she stays hydrated she has more regular BM. Lots of dairy constipates her and then that flares up her hemorrhoids .  Has some mild GERD but nothing frequent and most diet dependent. Only thing she can take is tums or rolaids. During chemo she was not allowed to take pepto bismol. Took PI many years ago but nothing recent.   Able to tolerate prep without issue. She does get nauseas with undergoing and  sedation and anesthesia. Does have an allergy to phenergan  - restless leg issue.   Takes an oral chemo pill and does an infusion every 3 months for 2 years to ensure there is no recurrence but she only has 2 more left. Next one in May and then in August and should be done.   Mother with history of breast cancer as well. Done genetic testing as well.   Is a phlebotomist.   Wt Readings from Last 3 Encounters:  04/08/24 169 lb 9.6 oz (76.9 kg)  02/26/24 171 lb (77.6 kg)  02/06/24 168 lb (76.2 kg)    Current Outpatient Medications  Medication Sig Dispense Refill   acetaminophen  (TYLENOL ) 500 MG tablet Take 500 mg by mouth every 6 (six) hours as needed for moderate pain.     Calcium -Magnesium-Vitamin D  (CALCIUM  1200+D3 PO) Take 1 Capful by mouth daily.     exemestane  (AROMASIN ) 25 MG tablet Take 1 tablet (25 mg total) by mouth daily after breakfast. 30 tablet 3   exemestane  (AROMASIN ) 25 MG tablet Take 1 tablet (25 mg total) by mouth daily after breakfast. 30 tablet 3   Lifitegrast  (XIIDRA ) 5 % SOLN Instill 1 drop into both eyes twice a day 180 each 4   omega-3 acid ethyl esters (LOVAZA) 1 g capsule Take by mouth 2 (two) times daily.     ondansetron  (ZOFRAN -ODT) 4 MG disintegrating tablet Take 1 tablet (4 mg total) by mouth every 8 (eight) hours as needed for Nausea migraine 20 tablet 11   Probiotic Product (PROBIOTIC PO)  Take 1 capsule by mouth daily.     rizatriptan  (MAXALT -MLT) 10 MG disintegrating tablet Take 1 tablet (10 mg total) by mouth once as needed (may repeat dose once in > 2 hrs) for up to 1 dose 10 tablet 11   topiramate  (TOPAMAX ) 25 MG tablet Take 1 tablet (25 mg total) by mouth 2 (two) times daily. 60 tablet 11   busPIRone  (BUSPAR ) 15 MG tablet Take 1/2 tablet (7.5mg ) by mouth 2 times per day for 5 days then start 1 tablet (15mg ) by mouth 2 times per day (Patient not taking: Reported on 04/08/2024) 42 tablet 0   cetirizine -pseudoephedrine  (ZYRTEC -D) 5-120 MG tablet Take 1 tablet  by mouth 2 (two) times daily. (Patient not taking: Reported on 04/08/2024) 20 tablet 0   fluticasone  (FLONASE ) 50 MCG/ACT nasal spray Place 2 sprays into both nostrils daily. (Patient not taking: Reported on 04/08/2024) 16 g 6   pregabalin  (LYRICA ) 75 MG capsule Take 1 capsule (75 mg total) by mouth 2 (two) times daily. (Patient not taking: Reported on 04/08/2024) 180 capsule 1   No current facility-administered medications for this visit.    Past Medical History:  Diagnosis Date   Anxiety    Bronchitis    Cancer (HCC) 09/2022   left breast IMC   Complication of anesthesia    PONV   Contusion of leg 10/06/2015   Depression    GERD (gastroesophageal reflux disease)    Insomnia    Migraine headache    Pneumonia    2000   PONV (postoperative nausea and vomiting)     Past Surgical History:  Procedure Laterality Date   ABDOMINAL HYSTERECTOMY     age [age]   BALLOON DILATION N/A 12/14/2020   Procedure: BALLOON DILATION;  Surgeon: [name], DO;  [facility]: [facility];  Service: Endoscopy;  Laterality: N/A;   BIOPSY  12/14/2020   Procedure: BIOPSY;  Surgeon: [name], DO;  [facility];  Service: Endoscopy;;   BREAST BIOPSY Left 08/24/2022   us  bx, coil marker +   BREAST LUMPECTOMY WITH RADIOACTIVE SEED AND SENTINEL LYMPH NODE BIOPSY Left 09/29/2022   Invasive ductal carcinoma, grade 2, pT1b, pN0  - Margins are negative for carcinoma in situ or invasive carcinoma   CESAREAN SECTION     CHOLECYSTECTOMY  1988   COLONOSCOPY N/A 12/10/2018    six 4-8 mm polyps in proximal descending colon, mid transverse and hepatic flexure. Diverticulosis. External and internal hemorrhoids. Torturous colon. Two simple adenomas, two serrated polyps, 2 hyperplastic polyps. Surveillance due Dec 2022.    ESOPHAGOGASTRODUODENOSCOPY (EGD) WITH PROPOFOL  N/A 12/14/2020   Benign-appearing esophageal stenosis s/p dilation, gastritis, normal duodenum. Mild chronic gastritis.    open  heart surgery  1967   PATENT DUCTUS ARTERIOUS REPAIR  1967   POLYPECTOMY  12/10/2018   Procedure: POLYPECTOMY;  Surgeon: Alyce Jubilee, MD;  [facility]: [facility];  Service: Endoscopy;;  hepatic flexure, descending, transverse   PORT-A-CATH REMOVAL Right 03/14/2023   Procedure: REMOVAL PORT-A-CATH;  Surgeon: Enid Harry, MD;  Location: [facility];  Service: General;  Laterality: Right;   PORTACATH PLACEMENT N/A 11/14/2022   Procedure: PORT PLACEMENT WITH ULTRASOUND GUIDANCE;  Surgeon: Enid Harry, MD;  Location: [facility];  Service: General;  Laterality: N/A;   SHOULDER ARTHROSCOPY Left 06/16/2017   Procedure: LEFT SHOULDER ARTHROSCOPY, DEBRIDEMENT, AND DECOMPRESSION;  Surgeon: Timothy Ford, MD;  Location: [facility];  Service: Orthopedics;  Laterality: Left;   SHOULDER  ARTHROSCOPY WITH SUBACROMIAL DECOMPRESSION Left 10/03/2019   Procedure: SHOULDER ARTHROSCOPY WITH DEBRIDEMENT, DECOMPRESSION;  Surgeon: Elner Hahn, MD;  Location: [facility];  Service: Orthopedics;  Laterality: Left;   SHOULDER SURGERY Left 2008   AC joint    Family History  Problem Relation Age of Onset   Breast cancer Mother 53   Hypertension Father    Colon cancer Paternal Uncle        dx 67s   Hypertension Maternal Grandmother    Diabetes Maternal Grandmother    Heart disease Maternal Grandmother 43       MI   Hypertension Maternal Grandfather    Throat cancer Maternal Grandfather 35   Allergies as of 04/08/2024 - Review Complete 04/08/2024  Allergen Reaction Noted   Cymbalta  [duloxetine  hcl] Other (See Comments) 02/09/2017   Nortriptyline  Nausea And Vomiting 02/09/2017   Phenergan  [promethazine  hcl]  12/10/2018    Social History   Socioeconomic History   Marital status: Married    Spouse name: daniel   Number of children: 4   Years of education: Not on file   Highest education level: Associate degree: occupational, Scientist, product/process development, or vocational program   Occupational History   Not on file  Tobacco Use   Smoking status: Never   Smokeless tobacco: Never  Vaping Use   Vaping status: Never Used  Substance and Sexual Activity   Alcohol use: Not Currently    Comment: occassionally-not since chemo started   Drug use: No   Sexual activity: Yes    Birth control/protection: Surgical    Comment: hyst  Other Topics Concern   Not on file  Social History Narrative   Not on file   Social Drivers of Health   Financial Resource Strain: Low Risk  (10/20/2023)   Overall Financial Resource Strain (CARDIA)    Difficulty of Paying Living Expenses: Not hard at all  Food Insecurity: No Food Insecurity (10/20/2023)   Hunger Vital Sign    Worried About Running Out of Food in the Last Year: Never true    Ran Out of Food in the Last Year: Never true  Transportation Needs: No Transportation Needs (10/20/2023)   PRAPARE - Administrator, Civil Service (Medical): No    Lack of Transportation (Non-Medical): No  Physical Activity: Sufficiently Active (10/20/2023)   Exercise Vital Sign    Days of Exercise per Week: 7 days    Minutes of Exercise per Session: 30 min  Stress: Stress Concern Present (10/20/2023)   [employer] of Occupational Health - Occupational Stress Questionnaire    Feeling of Stress : To some extent  Social Connections: Socially Integrated (10/20/2023)   Social Connection and Isolation Panel [NHANES]    Frequency of Communication with Friends and Family: Twice a week    Frequency of Social Gatherings with Friends and Family: Three times a week    Attends Religious Services: More than 4 times per year    Active Member of Clubs or Organizations: Yes    Attends Banker Meetings: More than 4 times per year    Marital Status: Married     Review of Systems   Gen: Denies fever, chills, anorexia. Denies fatigue, weakness, weight loss.  CV: Denies chest pain, palpitations, syncope, peripheral edema, and  claudication. Resp: Denies dyspnea at rest, cough, wheezing, coughing up blood, and pleurisy. GI: See HPI Derm: Denies rash, itching, dry skin Psych: Denies depression, anxiety, memory loss, confusion. No homicidal or suicidal ideation.  Heme: Denies bruising,  bleeding, and enlarged lymph nodes.  Physical Exam   BP 119/76   Pulse 64   Temp 98.7 F (37.1 C)   Ht 5\' 2"  (1.575 m)   Wt 169 lb 9.6 oz (76.9 kg)   BMI 31.02 kg/m   General:   Alert and oriented. No distress noted. Pleasant and cooperative.  Head:  Normocephalic and atraumatic. Eyes:  Conjuctiva clear without scleral icterus. Mouth:  Oral mucosa pink and moist. Good dentition. No lesions. Lungs:  Clear to auscultation bilaterally. No wheezes, rales, or rhonchi. No distress.  Heart:  S1, S2 present without murmurs appreciated.  Abdomen:  +BS, soft, non-tender and non-distended. No rebound or guarding. No HSM or masses noted. Rectal: deferred Msk:  Symmetrical without gross deformities. Normal posture. Extremities:  Without edema. Neurologic:  Alert and  oriented x4 Psych:  Alert and cooperative. Normal mood and affect.  Assessment  GURSIRAT LAVIOLETTE is a 58 y.o. female with a history of GERD, dysphagia, colon polyps, breast cancer, migraines, and anxiety/depression presenting today to schedule surveillance colonoscopy.   History of colon polyps: Last colonoscopy in December 2019 with multiple polyps removed, simple adenomas, serrated adenomas, and hyperplastic polyps.  Was advised to repeat in 3 years, currently well overdue for surveillance.  No alarm symptoms present at this time.  Family history of colon cancer in paternal uncle, no other first-degree relatives.  Genetic counseling performed during her breast cancer workup, unrevealing.  GERD: Well controlled with dietary avoidance and tums as needed.   PLAN   Proceed with colonoscopy with propofol  by Dr. [name]  in near future: the risks, benefits, and alternatives  have been discussed with the patient in detail. The patient states understanding and desires to proceed. ASA 2 Anesthesia FYI -commonly has postoperative nausea/vomiting, allergy to Phenergan . GERD diet as needed Continue Tums as needed Follow-up as needed  Julian Obey, MSN, FNP-BC, AGACNP-BC [facility]

## 2024-04-08 ENCOUNTER — Ambulatory Visit: Admitting: Gastroenterology

## 2024-04-08 ENCOUNTER — Encounter: Payer: Self-pay | Admitting: Gastroenterology

## 2024-04-08 VITALS — BP 119/76 | HR 64 | Temp 98.7°F | Ht 62.0 in | Wt 169.6 lb

## 2024-04-08 DIAGNOSIS — K219 Gastro-esophageal reflux disease without esophagitis: Secondary | ICD-10-CM | POA: Diagnosis not present

## 2024-04-08 DIAGNOSIS — Z860102 Personal history of hyperplastic colon polyps: Secondary | ICD-10-CM

## 2024-04-08 DIAGNOSIS — Z860101 Personal history of adenomatous and serrated colon polyps: Secondary | ICD-10-CM

## 2024-04-08 DIAGNOSIS — F4311 Post-traumatic stress disorder, acute: Secondary | ICD-10-CM | POA: Diagnosis not present

## 2024-04-08 DIAGNOSIS — Z860100 Personal history of colon polyps, unspecified: Secondary | ICD-10-CM

## 2024-04-08 DIAGNOSIS — F419 Anxiety disorder, unspecified: Secondary | ICD-10-CM | POA: Diagnosis not present

## 2024-04-08 NOTE — Patient Instructions (Addendum)
 We are scheduling you for a colonoscopy with Dr. [name]  in near future.  Our schedulers will give you a call to let you know when we have availability.  It was a pleasure to meet you today!  If you begin to have any concerning GI symptoms, feel free to reach out to the office.  I want to create trusting relationships with patients. If you receive a survey regarding your visit,  I greatly appreciate you taking time to fill this out on paper or through your MyChart. I value your feedback.  Julian Obey, MSN, FNP-BC, AGACNP-BC [facility]

## 2024-04-09 ENCOUNTER — Telehealth: Payer: Self-pay | Admitting: *Deleted

## 2024-04-09 ENCOUNTER — Other Ambulatory Visit: Payer: Self-pay

## 2024-04-09 MED ORDER — CLENPIQ 10-3.5-12 MG-GM -GM/175ML PO SOLN
1.0000 | ORAL | 0 refills | Status: DC
  Filled 2024-04-09: qty 350, 2d supply, fill #0

## 2024-04-09 MED FILL — Exemestane Tab 25 MG: ORAL | 30 days supply | Qty: 30 | Fill #1 | Status: AC

## 2024-04-09 NOTE — Telephone Encounter (Signed)
 Spoke with pt. Scheduled TCS with Dr. [name], ASA 2 5/23 at 1pm. Aware will send instructions via mychart. Rx for prep sent to [facility]

## 2024-04-10 ENCOUNTER — Other Ambulatory Visit: Payer: Self-pay

## 2024-04-12 ENCOUNTER — Ambulatory Visit: Payer: Commercial Managed Care - PPO

## 2024-04-12 ENCOUNTER — Ambulatory Visit: Payer: Commercial Managed Care - PPO | Admitting: Oncology

## 2024-04-12 ENCOUNTER — Other Ambulatory Visit: Payer: Commercial Managed Care - PPO

## 2024-04-14 ENCOUNTER — Encounter: Payer: Self-pay | Admitting: Nurse Practitioner

## 2024-04-15 ENCOUNTER — Inpatient Hospital Stay: Payer: Commercial Managed Care - PPO

## 2024-04-15 ENCOUNTER — Encounter: Payer: Self-pay | Admitting: Oncology

## 2024-04-15 ENCOUNTER — Inpatient Hospital Stay: Payer: Commercial Managed Care - PPO | Admitting: Oncology

## 2024-04-15 ENCOUNTER — Inpatient Hospital Stay: Payer: Commercial Managed Care - PPO | Attending: Oncology

## 2024-04-15 VITALS — BP 123/75 | HR 72 | Resp 18

## 2024-04-15 VITALS — BP 110/76 | HR 88 | Temp 96.0°F | Resp 18 | Ht 62.0 in | Wt 169.0 lb

## 2024-04-15 DIAGNOSIS — Z800 Family history of malignant neoplasm of digestive organs: Secondary | ICD-10-CM | POA: Insufficient documentation

## 2024-04-15 DIAGNOSIS — Z79899 Other long term (current) drug therapy: Secondary | ICD-10-CM | POA: Diagnosis not present

## 2024-04-15 DIAGNOSIS — C50412 Malignant neoplasm of upper-outer quadrant of left female breast: Secondary | ICD-10-CM | POA: Diagnosis not present

## 2024-04-15 DIAGNOSIS — Z5181 Encounter for therapeutic drug level monitoring: Secondary | ICD-10-CM

## 2024-04-15 DIAGNOSIS — Z79811 Long term (current) use of aromatase inhibitors: Secondary | ICD-10-CM

## 2024-04-15 DIAGNOSIS — Z7983 Long term (current) use of bisphosphonates: Secondary | ICD-10-CM

## 2024-04-15 DIAGNOSIS — Z803 Family history of malignant neoplasm of breast: Secondary | ICD-10-CM | POA: Diagnosis not present

## 2024-04-15 DIAGNOSIS — G4700 Insomnia, unspecified: Secondary | ICD-10-CM | POA: Diagnosis not present

## 2024-04-15 DIAGNOSIS — Z170 Estrogen receptor positive status [ER+]: Secondary | ICD-10-CM | POA: Insufficient documentation

## 2024-04-15 DIAGNOSIS — K219 Gastro-esophageal reflux disease without esophagitis: Secondary | ICD-10-CM | POA: Diagnosis not present

## 2024-04-15 LAB — CMP (CANCER CENTER ONLY)
ALT: 26 U/L (ref 0–44)
AST: 19 U/L (ref 15–41)
Albumin: 4.1 g/dL (ref 3.5–5.0)
Alkaline Phosphatase: 48 U/L (ref 38–126)
Anion gap: 9 (ref 5–15)
BUN: 14 mg/dL (ref 6–20)
CO2: 24 mmol/L (ref 22–32)
Calcium: 9.1 mg/dL (ref 8.9–10.3)
Chloride: 101 mmol/L (ref 98–111)
Creatinine: 0.65 mg/dL (ref 0.44–1.00)
GFR, Estimated: >60 mL/min (ref >60)
Glucose, Bld: 111 mg/dL — ABNORMAL HIGH (ref 70–99)
Potassium: 3.8 mmol/L (ref 3.5–5.1)
Sodium: 134 mmol/L — ABNORMAL LOW (ref 135–145)
Total Bilirubin: 0.5 mg/dL (ref 0.0–1.2)
Total Protein: 6.8 g/dL (ref 6.5–8.1)

## 2024-04-15 LAB — CBC WITH DIFFERENTIAL (CANCER CENTER ONLY)
Abs Immature Granulocytes: 0.03 10*3/uL (ref 0.00–0.07)
Basophils Absolute: 0.1 10*3/uL (ref 0.0–0.1)
Basophils Relative: 1 %
Eosinophils Absolute: 0.2 10*3/uL (ref 0.0–0.5)
Eosinophils Relative: 3 %
HCT: 38.6 % (ref 36.0–46.0)
Hemoglobin: 12.9 g/dL (ref 12.0–15.0)
Immature Granulocytes: 0 %
Lymphocytes Relative: 26 %
Lymphs Abs: 2.3 10*3/uL (ref 0.7–4.0)
MCH: 29.7 pg (ref 26.0–34.0)
MCHC: 33.4 g/dL (ref 30.0–36.0)
MCV: 88.9 fL (ref 80.0–100.0)
Monocytes Absolute: 0.7 10*3/uL (ref 0.1–1.0)
Monocytes Relative: 7 %
Neutro Abs: 5.5 10*3/uL (ref 1.7–7.7)
Neutrophils Relative %: 63 %
Platelet Count: 217 10*3/uL (ref 150–400)
RBC: 4.34 MIL/uL (ref 3.87–5.11)
RDW: 12.8 % (ref 11.5–15.5)
WBC Count: 8.8 10*3/uL (ref 4.0–10.5)
nRBC: 0 % (ref 0.0–0.2)

## 2024-04-15 MED ORDER — SODIUM CHLORIDE 0.9 % IV SOLN
INTRAVENOUS | Status: DC
  Filled 2024-04-15: qty 250

## 2024-04-15 MED ORDER — ZOLEDRONIC ACID 4 MG/100ML IV SOLN
4.0000 mg | INTRAVENOUS | Status: DC
  Administered 2024-04-15: 4 mg via INTRAVENOUS
  Filled 2024-04-15: qty 100

## 2024-04-15 NOTE — Patient Instructions (Signed)

## 2024-04-15 NOTE — Progress Notes (Signed)
 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  This was followed by a diagnostic mammogram and ultrasound which showed multiple benign cysts noted at the 1 o'clock position.  At the 2 o'clock position 3 cm from the nipple was a oval hypoechoic mass measuring 5 x 4 x 3 mm.  No suspicious left axillary adenopathy.  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   Final pathology showed grade two 7 mm tumor with negative margins.  1 sentinel lymph node negative for malignancy.  Oncotype results showed a recurrence score of 28 and absolute benefit of chemotherapy at greater than 15%.   Patient completed 4 cycles of adjuvant TC chemotherapy in February 2024.  She also completed adjuvant radiation and started taking Arimidex  in March 2024.  She subsequently switched to exemestane  with better tolerance she is also on adjuvant Zometa .      Interval history-shortness of breath is improved at  this time.  She is tolerating exemestane  well without any significant side effects.  Denies any breast concerns today.  ECOG PS- 0 Pain scale- 0   Review of systems- Review of Systems  Constitutional:  Negative for chills, fever, malaise/fatigue and weight loss.  HENT:  Negative for congestion, ear discharge and nosebleeds.   Eyes:  Negative for blurred vision.  Respiratory:  Negative for cough, hemoptysis, sputum production, shortness of breath and wheezing.   Cardiovascular:  Negative for chest pain, palpitations, orthopnea and claudication.  Gastrointestinal:  Negative for abdominal pain, blood in stool, constipation, diarrhea, heartburn, melena, nausea and vomiting.  Genitourinary:  Negative for dysuria, flank pain, frequency, hematuria and urgency.  Musculoskeletal:  Negative for back pain, joint pain and myalgias.  Skin:  Negative for rash.  Neurological:  Negative for dizziness, tingling, focal weakness, seizures, weakness and headaches.  Endo/Heme/Allergies:  Does not bruise/bleed easily.  Psychiatric/Behavioral:  Negative for depression and suicidal ideas. The patient does not have insomnia.       Allergies  Allergen Reactions   Cymbalta  [Duloxetine  Hcl] Other (See Comments)    Hallucinations; sleep walking   Nortriptyline  Nausea And Vomiting   Phenergan  [Promethazine  Hcl]     Restless legs and cramping     Past Medical History:  Diagnosis Date   Anxiety    Bronchitis    Cancer (HCC) 09/2022   left breast IMC   Complication of anesthesia    PONV   Contusion of leg 10/06/2015   Depression    GERD (gastroesophageal reflux disease)    Insomnia    Migraine headache    Pneumonia  2000   PONV (postoperative nausea and vomiting)      Past Surgical History:  Procedure Laterality Date   ABDOMINAL HYSTERECTOMY     age [age]   BALLOON DILATION N/A 12/14/2020   Procedure: BALLOON DILATION;  Surgeon: [name], DO;  [facility];  Service:  Endoscopy;  Laterality: N/A;   BIOPSY  12/14/2020   Procedure: BIOPSY;  Surgeon: [name], DO;  [facility]: [facility];  Service: Endoscopy;;   BREAST BIOPSY Left 08/24/2022   us  bx, coil marker +   BREAST LUMPECTOMY WITH RADIOACTIVE SEED AND SENTINEL LYMPH NODE BIOPSY Left 09/29/2022   Invasive ductal carcinoma, grade 2, pT1b, pN0  - Margins are negative for carcinoma in situ or invasive carcinoma   CESAREAN SECTION     CHOLECYSTECTOMY  1988   COLONOSCOPY N/A 12/10/2018    six 4-8 mm polyps in proximal descending colon, mid transverse and hepatic flexure. Diverticulosis. External and internal hemorrhoids. Torturous colon. Two simple adenomas, two serrated polyps, 2 hyperplastic polyps. Surveillance due Dec 2022.    ESOPHAGOGASTRODUODENOSCOPY (EGD) WITH PROPOFOL  N/A 12/14/2020   Benign-appearing esophageal stenosis s/p dilation, gastritis, normal duodenum. Mild chronic gastritis.    open heart surgery  1967   PATENT DUCTUS ARTERIOUS REPAIR  1967   POLYPECTOMY  12/10/2018   Procedure: POLYPECTOMY;  Surgeon: Alyce Jubilee, MD;  Location: [facility];  Service: Endoscopy;;  hepatic flexure, descending, transverse   PORT-A-CATH REMOVAL Right 03/14/2023   Procedure: REMOVAL PORT-A-CATH;  Surgeon: Enid Harry, MD;  Location: [facility];  Service: General;  Laterality: Right;   PORTACATH PLACEMENT N/A 11/14/2022   Procedure: PORT PLACEMENT WITH ULTRASOUND GUIDANCE;  Surgeon: Enid Harry, MD;  Location: [facility];  Service: General;  Laterality: N/A;   SHOULDER ARTHROSCOPY Left 06/16/2017   Procedure: LEFT SHOULDER ARTHROSCOPY, DEBRIDEMENT, AND DECOMPRESSION;  Surgeon: Timothy Ford, MD;  Location: [facility];  Service: Orthopedics;  Laterality: Left;   SHOULDER ARTHROSCOPY WITH SUBACROMIAL DECOMPRESSION Left 10/03/2019   Procedure: SHOULDER ARTHROSCOPY WITH DEBRIDEMENT, DECOMPRESSION;  Surgeon: Elner Hahn, MD;  Location: [facility];  Service:  Orthopedics;  Laterality: Left;   SHOULDER SURGERY Left 2008   [facility] joint    Social History   Socioeconomic History   Marital status: Married    Spouse name: daniel   Number of children: 4   Years of education: Not on file   Highest education level: Associate degree: occupational, Scientist, product/process development, or vocational program  Occupational History   Not on file  Tobacco Use   Smoking status: Never   Smokeless tobacco: Never  Vaping Use   Vaping status: Never Used  Substance and Sexual Activity   Alcohol use: Not Currently    Comment: occassionally-not since chemo started   Drug use: No   Sexual activity: Yes    Birth control/protection: Surgical    Comment: hyst  Other Topics Concern   Not on file  Social History Narrative   Not on file   Social Drivers of Health   Financial Resource Strain: Low Risk  (10/20/2023)   Overall Financial Resource Strain (CARDIA)    Difficulty of Paying Living Expenses: Not hard at all  Food Insecurity: No Food Insecurity (10/20/2023)   Hunger Vital Sign    Worried About Running Out of Food in the Last Year: Never true    Ran Out of Food in the Last Year: Never true  Transportation Needs: No Transportation Needs (10/20/2023)   PRAPARE -  Administrator, Civil Service (Medical): No    Lack of Transportation (Non-Medical): No  Physical Activity: Sufficiently Active (10/20/2023)   Exercise Vital Sign    Days of Exercise per Week: 7 days    Minutes of Exercise per Session: 30 min  Stress: Stress Concern Present (10/20/2023)   [employer] of Occupational Health - Occupational Stress Questionnaire    Feeling of Stress : To some extent  Social Connections: Socially Integrated (10/20/2023)   Social Connection and Isolation Panel [NHANES]    Frequency of Communication with Friends and Family: Twice a week    Frequency of Social Gatherings with Friends and Family: Three times a week    Attends Religious Services: More than 4 times per year     Active Member of Clubs or Organizations: Yes    Attends Banker Meetings: More than 4 times per year    Marital Status: Married  Catering manager Violence: Not At Risk (07/12/2022)   Humiliation, Afraid, Rape, and Kick questionnaire    Fear of Current or Ex-Partner: No    Emotionally Abused: No    Physically Abused: No    Sexually Abused: No    Family History  Problem Relation Age of Onset   Breast cancer Mother 31   Hypertension Father    Colon cancer Paternal Uncle        dx 55s   Hypertension Maternal Grandmother    Diabetes Maternal Grandmother    Heart disease Maternal Grandmother 40       MI   Hypertension Maternal Grandfather    Throat cancer Maternal Grandfather 36     Current Outpatient Medications:    acetaminophen  (TYLENOL ) 500 MG tablet, Take 500 mg by mouth every 6 (six) hours as needed for moderate pain., Disp: , Rfl:    Calcium -Magnesium-Vitamin D  (CALCIUM  1200+D3 PO), Take 1 Capful by mouth daily., Disp: , Rfl:    cetirizine -pseudoephedrine  (ZYRTEC -D) 5-120 MG tablet, Take 1 tablet by mouth 2 (two) times daily. (Patient not taking: Reported on 04/08/2024), Disp: 20 tablet, Rfl: 0   exemestane  (AROMASIN ) 25 MG tablet, Take 1 tablet (25 mg total) by mouth daily after breakfast., Disp: 30 tablet, Rfl: 3   fluticasone  (FLONASE ) 50 MCG/ACT nasal spray, Place 2 sprays into both nostrils daily. (Patient not taking: Reported on 04/08/2024), Disp: 16 g, Rfl: 6   Lifitegrast  (XIIDRA ) 5 % SOLN, Instill 1 drop into both eyes twice a day, Disp: 180 each, Rfl: 4   omega-3 acid ethyl esters (LOVAZA) 1 g capsule, Take by mouth 2 (two) times daily., Disp: , Rfl:    ondansetron  (ZOFRAN -ODT) 4 MG disintegrating tablet, Take 1 tablet (4 mg total) by mouth every 8 (eight) hours as needed for Nausea migraine, Disp: 20 tablet, Rfl: 11   Probiotic Product (PROBIOTIC PO), Take 1 capsule by mouth daily., Disp: , Rfl:    rizatriptan  (MAXALT -MLT) 10 MG disintegrating tablet, Take 1  tablet (10 mg total) by mouth once as needed (may repeat dose once in > 2 hrs) for up to 1 dose, Disp: 10 tablet, Rfl: 11   Sod Picosulfate-Mag Ox-Cit Acd (CLENPIQ ) 10-3.5-12 MG-GM -GM/175ML SOLN, Take 1 kit by mouth as directed., Disp: 350 mL, Rfl: 0   topiramate  (TOPAMAX ) 25 MG tablet, Take 1 tablet (25 mg total) by mouth 2 (two) times daily., Disp: 60 tablet, Rfl: 11 No current facility-administered medications for this visit.  Facility-Administered Medications Ordered in Other Visits:    0.9 %  sodium chloride   infusion, , Intravenous, Continuous, Avonne Boettcher, MD, Stopped at 04/15/24 1357   Zoledronic  Acid (ZOMETA ) IVPB 4 mg, 4 mg, Intravenous, Q90 days, Avonne Boettcher, MD, Stopped at 04/15/24 1352  Physical exam:  Vitals:   04/15/24 1309  BP: 110/76  Pulse: 88  Resp: 18  Temp: (!) 96 F (35.6 C)  TempSrc: Tympanic  SpO2: 100%  Weight: 169 lb (76.7 kg)  Height: 5\' 2"  (1.575 m)   Physical Exam Cardiovascular:     Rate and Rhythm: Normal rate and regular rhythm.     Heart sounds: Normal heart sounds.  Pulmonary:     Effort: Pulmonary effort is normal.     Breath sounds: Normal breath sounds.  Skin:    General: Skin is warm and dry.  Neurological:     Mental Status: She is alert and oriented to person, place, and time.    Breast exam was performed in seated and lying down position. Patient is status post left lumpectomy with a well-healed surgical scar. No evidence of any palpable masses. No evidence of axillary adenopathy. No evidence of any palpable masses or lumps in the right breast. No evidence of right axillary adenopathy   I have personally reviewed labs listed below:    Latest Ref Rng & Units 04/15/2024   12:50 PM  CMP  Glucose 70 - 99 mg/dL 324   BUN 6 - 20 mg/dL 14   Creatinine 4.01 - 1.00 mg/dL 0.27   Sodium 253 - 664 mmol/L 134   Potassium 3.5 - 5.1 mmol/L 3.8   Chloride 98 - 111 mmol/L 101   CO2 22 - 32 mmol/L 24   Calcium  8.9 - 10.3 mg/dL 9.1   Total  Protein 6.5 - 8.1 g/dL 6.8   Total Bilirubin 0.0 - 1.2 mg/dL 0.5   Alkaline Phos 38 - 126 U/L 48   AST 15 - 41 U/L 19   ALT 0 - 44 U/L 26       Latest Ref Rng & Units 04/15/2024   12:50 PM  CBC  WBC 4.0 - 10.5 K/uL 8.8   Hemoglobin 12.0 - 15.0 g/dL 40.3   Hematocrit 47.4 - 46.0 % 38.6   Platelets 150 - 400 K/uL 217      Assessment and plan- Patient is a 58 y.o. female with history of stage I pathological prognostic stage Ia left breast cancer ER/PR positive HER2 negative s/p adjuvant chemotherapy and radiation here for routine follow-up  Calcium  levels acceptable to proceed with dose 5 out of 6 adjuvant Zometa  today.  I will see her back in 3 months with labs for her last dose of Zometa .  We will obtain a bone density scan in November 2025 as well.  With regards to her breast cancer she will continue with exemestane  along with calcium  and vitamin D  which she is tolerating well.  She will be due for a mammogram in August 2025 which we will schedule.   Visit Diagnosis 1. Encounter for monitoring zoledronic  acid therapy   2. Use of exemestane  (Aromasin )   3. Malignant neoplasm of upper-outer quadrant of left breast in female, estrogen receptor positive (HCC)   4. High risk medication use      Dr. Seretha Dance, MD, MPH [facility] [phone] 04/15/2024 3:44 PM

## 2024-04-22 ENCOUNTER — Encounter: Payer: Self-pay | Admitting: Radiation Oncology

## 2024-04-22 ENCOUNTER — Ambulatory Visit
Admission: RE | Admit: 2024-04-22 | Discharge: 2024-04-22 | Disposition: A | Discharge status: Home / Self Care | Payer: Commercial Managed Care - PPO | Source: Ambulatory Visit | Attending: Radiation Oncology | Admitting: Radiation Oncology

## 2024-04-22 VITALS — BP 106/75 | HR 65 | Temp 97.8°F | Resp 16 | Ht 62.0 in | Wt 170.0 lb

## 2024-04-22 DIAGNOSIS — Z79811 Long term (current) use of aromatase inhibitors: Secondary | ICD-10-CM | POA: Insufficient documentation

## 2024-04-22 DIAGNOSIS — Z923 Personal history of irradiation: Secondary | ICD-10-CM | POA: Insufficient documentation

## 2024-04-22 DIAGNOSIS — F419 Anxiety disorder, unspecified: Secondary | ICD-10-CM | POA: Diagnosis not present

## 2024-04-22 DIAGNOSIS — M51369 Other intervertebral disc degeneration, lumbar region without mention of lumbar back pain or lower extremity pain: Secondary | ICD-10-CM | POA: Insufficient documentation

## 2024-04-22 DIAGNOSIS — C50412 Malignant neoplasm of upper-outer quadrant of left female breast: Secondary | ICD-10-CM | POA: Insufficient documentation

## 2024-04-22 DIAGNOSIS — Z170 Estrogen receptor positive status [ER+]: Secondary | ICD-10-CM | POA: Insufficient documentation

## 2024-04-22 NOTE — Progress Notes (Signed)
 Radiation Oncology Follow up Note  Name: Nichole Brown   Date:   04/22/2024 MRN:  [id number] DOB: [birth year]    This 58 y.o. female presents to the clinic today for 36-month follow-up status post whole breast radiation to her left breast for stage Ia (pT1b N0 M0) ER/PR positive invasive mammary carcinoma.  REFERRING PROVIDER: Cook, Jayce G, DO  HPI: Patient is a 58 year old female now out 13 months having completed whole breast radiation to her left breast for stage Ia ER positive invasive mammary carcinoma.  Seen today in routine follow-up she is doing well.  She specifically denies breast tenderness cough or bone pain..  She had a CT scan of her chest back in February showing no evidence of local recurrence.  She also had a lumbar spine MRI scan which I have reviewed showing lower lumbar disc degeneration without neural compression.  She also had an MRI scan of her right knee showing horizontal tear of the posterior horn of the medial meniscus.  No other evidence of metastatic disease.  She is currently on Aromasin   COMPLICATIONS OF TREATMENT: none  FOLLOW UP COMPLIANCE: keeps appointments   PHYSICAL EXAM:  BP 106/75   Pulse 65   Temp 97.8 F (36.6 C) (Tympanic)   Resp 16   Ht 5\' 2"  (1.575 m)   Wt 170 lb (77.1 kg)   BMI 31.09 kg/m the left breast is somewhat smaller than the right breast although this dates back to prior thoracic surgery.  No dominant masses noted in either breast no axillary or supraclavicular adenopathy is appreciated. Well-developed well-nourished patient in NAD. HEENT reveals PERLA, EOMI, discs not visualized.  Oral cavity is clear. No oral mucosal lesions are identified. Neck is clear without evidence of cervical or supraclavicular adenopathy. Lungs are clear to A&P. Cardiac examination is essentially unremarkable with regular rate and rhythm without murmur rub or thrill. Abdomen is benign with no organomegaly or masses noted. Motor sensory and DTR levels are  equal and symmetric in the upper and lower extremities. Cranial nerves II through XII are grossly intact. Proprioception is intact. No peripheral adenopathy or edema is identified. No motor or sensory levels are noted. Crude visual fields are within normal range.  RADIOLOGY RESULTS: CT scans MRI scans all reviewed compatible with above-stated findings  PLAN: The present time patient is doing well now at 13 months with no evidence of disease.  I am pleased with her overall progress.  Of asked to see her back in 1 year for follow-up.  Patient knows to call with any concerns.  I would like to take this opportunity to thank you for allowing me to participate in the care of your patient.Glenis Langdon, MD

## 2024-04-30 ENCOUNTER — Other Ambulatory Visit: Payer: Self-pay

## 2024-04-30 ENCOUNTER — Ambulatory Visit: Admitting: Nurse Practitioner

## 2024-04-30 ENCOUNTER — Encounter (HOSPITAL_COMMUNITY): Payer: Self-pay

## 2024-04-30 ENCOUNTER — Encounter: Payer: Self-pay | Admitting: Oncology

## 2024-04-30 ENCOUNTER — Ambulatory Visit: Payer: Self-pay

## 2024-04-30 ENCOUNTER — Encounter (HOSPITAL_COMMUNITY)
Admission: RE | Admit: 2024-04-30 | Discharge: 2024-04-30 | Disposition: A | Discharge status: Home / Self Care | Source: Ambulatory Visit | Attending: Internal Medicine | Admitting: Internal Medicine

## 2024-04-30 ENCOUNTER — Encounter: Payer: Self-pay | Admitting: Nurse Practitioner

## 2024-04-30 VITALS — BP 101/68 | HR 84 | Temp 98.5°F | Ht 62.0 in | Wt 167.0 lb

## 2024-04-30 DIAGNOSIS — M545 Low back pain, unspecified: Secondary | ICD-10-CM

## 2024-04-30 DIAGNOSIS — R350 Frequency of micturition: Secondary | ICD-10-CM | POA: Diagnosis not present

## 2024-04-30 DIAGNOSIS — J300 Vasomotor rhinitis: Secondary | ICD-10-CM | POA: Diagnosis not present

## 2024-04-30 LAB — POCT URINALYSIS DIP (CLINITEK)
Bilirubin, UA: NEGATIVE
Glucose, UA: NEGATIVE mg/dL
Ketones, POC UA: NEGATIVE mg/dL
Leukocytes, UA: NEGATIVE
Nitrite, UA: NEGATIVE
POC PROTEIN,UA: NEGATIVE
Spec Grav, UA: 1.01 (ref 1.010–1.025)
Urobilinogen, UA: 0.2 U/dL
pH, UA: 6 (ref 5.0–8.0)

## 2024-04-30 MED ORDER — FLUTICASONE PROPIONATE 50 MCG/ACT NA SUSP
2.0000 | Freq: Every day | NASAL | 5 refills | Status: AC
  Filled 2024-04-30: qty 16, 30d supply, fill #0
  Filled 2024-07-04 – 2024-10-20 (×2): qty 16, 30d supply, fill #1
  Filled 2025-01-02: qty 48, 90d supply, fill #2
  Filled 2025-01-02: qty 16, 30d supply, fill #2

## 2024-04-30 MED ORDER — CETIRIZINE-PSEUDOEPHEDRINE ER 5-120 MG PO TB12
1.0000 | ORAL_TABLET | Freq: Two times a day (BID) | ORAL | 0 refills | Status: DC
  Filled 2024-04-30: qty 24, 12d supply, fill #0
  Filled 2024-05-15: qty 24, 12d supply, fill #1

## 2024-04-30 NOTE — Progress Notes (Signed)
 Subjective:    Patient ID: Nichole Brown, female    DOB: Dec 16, 1965, 58 y.o.   MRN: [id number]  HPI  Runny nose, fatigue, sneezing, mid, coughing, pain with deep breaths, frequent urination  No fevers Presents for complaints of congestion and sneezing that began last week.  No fever.  Occasional nonproductive cough.  Some fatigue.  No sore throat or ear pain.  Frontal area sinus pressure.  Has had some improvement with Zyrtec -D and Flonase  in the past but needs refills.  Has had occasional urinary frequency and urgency.  No dysuria.  No obvious blood in her urine.  No recent UTI.  Drinks a large amount of fluids so urine is usually very dilute.  Notes she is scheduled for colonoscopy on 5/23.  Has had some back discomfort on the right side mid to lower area which has bothered her off and on for a while but more so the past few days with her illness.  Occurs sometimes with deep breath or certain movements.  Localized to the right mid to low back area.  No history of injury.  No rashes been noted.  Review of Systems  Constitutional:  Positive for fatigue. Negative for fever.  HENT:  Positive for congestion, postnasal drip and sinus pressure. Negative for ear pain and sore throat.   Respiratory:  Positive for cough. Negative for chest tightness, shortness of breath and wheezing.   Cardiovascular:  Negative for chest pain.  Genitourinary:  Positive for frequency and urgency. Negative for dysuria and hematuria.       Objective:   Physical Exam Constitutional:      General: She is not in acute distress. HENT:     Ears:     Comments: TMs retracted bilaterally, no erythema, slightly more on the right side.    Mouth/Throat:     Mouth: Mucous membranes are moist.     Pharynx: Oropharynx is clear.  Neck:     Comments: Mild anterior cervical adenopathy. Cardiovascular:     Rate and Rhythm: Normal rate and regular rhythm.     Heart sounds: Normal heart sounds.  Pulmonary:     Effort:  Pulmonary effort is normal.     Breath sounds: Normal breath sounds.  Abdominal:     General: There is no distension.     Tenderness: There is no abdominal tenderness. There is no right CVA tenderness or left CVA tenderness.  Musculoskeletal:     Cervical back: Neck supple.     Comments: Generalized tenderness noted with palpation of the right mid to lower back.  No spinal tenderness.  Tight tender muscles noted near the low back area.  Lymphadenopathy:     Cervical: Cervical adenopathy present.  Neurological:     Mental Status: She is alert.  Psychiatric:        Mood and Affect: Mood normal.        Behavior: Behavior normal.        Thought Content: Thought content normal.        Judgment: Judgment normal.    Results for orders placed or performed in visit on 04/30/24  POCT URINALYSIS DIP (CLINITEK)   Collection Time: 04/30/24  4:06 PM  Result Value Ref Range   Color, UA yellow yellow   Clarity, UA clear clear   Glucose, UA negative negative mg/dL   Bilirubin, UA negative negative   Ketones, POC UA negative negative mg/dL   Spec Grav, UA 2.025 4.270 - 1.025   Blood, UA  small (A) negative   pH, UA 6.0 5.0 - 8.0   POC PROTEIN,UA negative negative, trace   Urobilinogen, UA 0.2 0.2 or 1.0 E.U./dL   Nitrite, UA Negative Negative   Leukocytes, UA Negative Negative   *Note: Due to a large number of results and/or encounters for the requested time period, some results have not been displayed. A complete set of results can be found in Results Review.        Assessment & Plan:   Problem List Items Addressed This Visit       Respiratory   Vasomotor rhinitis - Primary   Relevant Medications   cetirizine -pseudoephedrine  (ZYRTEC -D) 5-120 MG tablet   fluticasone  (FLONASE ) 50 MCG/ACT nasal spray     Other   Acute right-sided low back pain without sciatica   Other Visit Diagnoses       Frequent urination       Relevant Orders   POCT URINALYSIS DIP (CLINITEK) (Completed)    Urine Culture        Meds ordered this encounter  Medications   cetirizine -pseudoephedrine  (ZYRTEC -D) 5-120 MG tablet    Sig: Take 1 tablet by mouth 2 (two) times daily.    Dispense:  30 tablet    Refill:  0    Supervising Provider:   Charlotta Cook A [[id number]]   fluticasone  (FLONASE ) 50 MCG/ACT nasal spray    Sig: Place 2 sprays into both nostrils daily.    Dispense:  16 g    Refill:  5    Supervising Provider:   Charlotta Cook A [[id number]]   Restart Zyrtec -D and Flonase  nasal spray as directed.  Patient states decongestant had minimal effect on her blood pressure, continue to monitor as a precaution. No antibiotics indicated at this time. Previous notes indicate patient has had off-and-on pain in the right mid to low back area.  We will recheck this at her physical next month as well as rechecking her urine to be sure no further blood is noted.  Urine sent for culture as a precaution. Recheck at her physical in June, call back sooner if needed.

## 2024-04-30 NOTE — Pre-Procedure Instructions (Signed)
 Attempted pre-op phonecall. Left VM for her to call us back.

## 2024-04-30 NOTE — Telephone Encounter (Signed)
Noted appointment scheduled.

## 2024-04-30 NOTE — Telephone Encounter (Signed)
 Copied from CRM [id number]. Topic: Clinical - Red Word Triage >> Apr 30, 2024 11:19 AM Chrystal Crape R wrote: Pain- Pt has new symp  runny nose, headache, sneezing, back ache   Chief Complaint: Cold Like Symptoms  Symptoms: Back Pain, Runny Nose, Headache,  Sneezing  Frequency: Since Last Thursday  Pertinent Negatives: Patient denies chest pain, dyspnea, or any acute distress like symptoms.   Disposition: [] ED /[] Urgent Care (no appt availability in office) / [] Appointment(In office/virtual)/ []  [facility]/ [] Home Care/ [] Refused Recommended Disposition /[] [facility]/ []  Follow-up with PCP Additional Notes: RR is being triaged for multiple symptoms that started last Thursday. The patient denies a consistent cough, but confirms sneezing, back pain, runny nose, and a headache.   At home COVID test negative.   The patient is complaining of her back on her right side that goes up. The patient denies radiation of the pain or numbness or tingling in the legs.   Reason for Disposition  [1] Sinus congestion (pressure, fullness) AND [2] present > 10 days  Answer Assessment - Initial Assessment Questions 1. ONSET: "When did the nasal discharge start?"      Since Thursday  2. AMOUNT: "How much discharge is there?"      Moderate  3. COUGH: "Do you have a cough?" If Yes, ask: "Describe the color of your sputum" (clear, white, yellow, green)     No  4. RESPIRATORY DISTRESS: "Describe your breathing."      Mild, Intermittent  5. FEVER: "Do you have a fever?" If Yes, ask: "What is your temperature, how was it measured, and when did it start?"     No  6. SEVERITY: "Overall, how bad are you feeling right now?" (e.g., doesn't interfere with normal activities, staying home from school/work, staying in bed)      Less functional than normal  7. OTHER SYMPTOMS: "Do you have any other symptoms?" (e.g., sore throat, earache, wheezing, vomiting)     Sneezing, headache, back pain,  runny nose  8. PREGNANCY: "Is there any chance you are pregnant?" "When was your last menstrual period?"     No and NO  Protocols used: Common Cold-A-AH

## 2024-05-01 ENCOUNTER — Other Ambulatory Visit: Payer: Self-pay

## 2024-05-01 ENCOUNTER — Encounter: Payer: Self-pay | Admitting: Nurse Practitioner

## 2024-05-01 DIAGNOSIS — M545 Low back pain, unspecified: Secondary | ICD-10-CM | POA: Insufficient documentation

## 2024-05-03 ENCOUNTER — Ambulatory Visit (HOSPITAL_COMMUNITY): Admitting: Certified Registered"

## 2024-05-03 ENCOUNTER — Encounter (HOSPITAL_COMMUNITY)
Admission: RE | Disposition: A | Discharge status: Home / Self Care | Payer: Self-pay | Source: Home / Self Care | Attending: Internal Medicine

## 2024-05-03 ENCOUNTER — Encounter (HOSPITAL_COMMUNITY): Payer: Self-pay | Admitting: Internal Medicine

## 2024-05-03 ENCOUNTER — Other Ambulatory Visit: Payer: Self-pay

## 2024-05-03 ENCOUNTER — Ambulatory Visit (HOSPITAL_COMMUNITY)
Admission: RE | Admit: 2024-05-03 | Discharge: 2024-05-03 | Disposition: A | Discharge status: Home / Self Care | Attending: Internal Medicine | Admitting: Internal Medicine

## 2024-05-03 DIAGNOSIS — F32A Depression, unspecified: Secondary | ICD-10-CM | POA: Insufficient documentation

## 2024-05-03 DIAGNOSIS — Z803 Family history of malignant neoplasm of breast: Secondary | ICD-10-CM | POA: Diagnosis not present

## 2024-05-03 DIAGNOSIS — K5730 Diverticulosis of large intestine without perforation or abscess without bleeding: Secondary | ICD-10-CM | POA: Insufficient documentation

## 2024-05-03 DIAGNOSIS — K635 Polyp of colon: Secondary | ICD-10-CM | POA: Diagnosis not present

## 2024-05-03 DIAGNOSIS — Z888 Allergy status to other drugs, medicaments and biological substances status: Secondary | ICD-10-CM | POA: Diagnosis not present

## 2024-05-03 DIAGNOSIS — K648 Other hemorrhoids: Secondary | ICD-10-CM | POA: Diagnosis not present

## 2024-05-03 DIAGNOSIS — Z79811 Long term (current) use of aromatase inhibitors: Secondary | ICD-10-CM | POA: Diagnosis not present

## 2024-05-03 DIAGNOSIS — Z800 Family history of malignant neoplasm of digestive organs: Secondary | ICD-10-CM | POA: Diagnosis not present

## 2024-05-03 DIAGNOSIS — C50919 Malignant neoplasm of unspecified site of unspecified female breast: Secondary | ICD-10-CM | POA: Diagnosis not present

## 2024-05-03 DIAGNOSIS — Z9221 Personal history of antineoplastic chemotherapy: Secondary | ICD-10-CM | POA: Diagnosis not present

## 2024-05-03 DIAGNOSIS — F419 Anxiety disorder, unspecified: Secondary | ICD-10-CM | POA: Insufficient documentation

## 2024-05-03 DIAGNOSIS — K219 Gastro-esophageal reflux disease without esophagitis: Secondary | ICD-10-CM | POA: Diagnosis not present

## 2024-05-03 DIAGNOSIS — M797 Fibromyalgia: Secondary | ICD-10-CM | POA: Diagnosis not present

## 2024-05-03 DIAGNOSIS — Z860100 Personal history of colon polyps, unspecified: Secondary | ICD-10-CM | POA: Diagnosis not present

## 2024-05-03 DIAGNOSIS — Z1211 Encounter for screening for malignant neoplasm of colon: Secondary | ICD-10-CM | POA: Diagnosis not present

## 2024-05-03 DIAGNOSIS — D122 Benign neoplasm of ascending colon: Secondary | ICD-10-CM

## 2024-05-03 HISTORY — PX: COLONOSCOPY: SHX5424

## 2024-05-03 SURGERY — COLONOSCOPY
Anesthesia: General

## 2024-05-03 MED ORDER — LACTATED RINGERS IV SOLN: INTRAVENOUS | Status: DC

## 2024-05-03 MED ORDER — LIDOCAINE 2% (20 MG/ML) 5 ML SYRINGE
INTRAMUSCULAR | Status: DC | PRN
  Administered 2024-05-03: 60 mg via INTRAVENOUS

## 2024-05-03 MED ORDER — ACETAMINOPHEN 500 MG PO TABS
ORAL_TABLET | ORAL | Status: AC
  Filled 2024-05-03: qty 2

## 2024-05-03 MED ORDER — ACETAMINOPHEN 500 MG PO TABS
500.0000 mg | ORAL_TABLET | Freq: Once | ORAL | Status: AC
  Administered 2024-05-03: 500 mg via ORAL

## 2024-05-03 MED ORDER — PROPOFOL 500 MG/50ML IV EMUL
INTRAVENOUS | Status: DC | PRN
  Administered 2024-05-03: 50 mg via INTRAVENOUS
  Administered 2024-05-03 (×2): 20 mg via INTRAVENOUS
  Administered 2024-05-03: 100 ug/kg/min via INTRAVENOUS
  Administered 2024-05-03: 30 mg via INTRAVENOUS

## 2024-05-03 MED ORDER — ONDANSETRON HCL 4 MG/2ML IJ SOLN
INTRAMUSCULAR | Status: DC | PRN
  Administered 2024-05-03: 4 mg via INTRAVENOUS

## 2024-05-03 NOTE — Interval H&P Note (Signed)
 History and Physical Interval Note:  05/03/2024 11:44 AM  Nichole Brown  has presented today for surgery, with the diagnosis of HX COLON POLYPS.  The various methods of treatment have been discussed with the patient and family. After consideration of risks, benefits and other options for treatment, the patient has consented to  Procedure(s) with comments: COLONOSCOPY (N/A) - 1:00PM, ASA 2 as a surgical intervention.  The patient's history has been reviewed, patient examined, no change in status, stable for surgery.  I have reviewed the patient's chart and labs.  Questions were answered to the patient's satisfaction.     [name]

## 2024-05-03 NOTE — Transfer of Care (Signed)
 Immediate Anesthesia Transfer of Care Note  Patient: Nichole Brown  Procedure(s) Performed: COLONOSCOPY  Patient Location: PACU and Endoscopy Unit  Anesthesia Type:MAC  Level of Consciousness: awake and alert   Airway & Oxygen Therapy: Patient Spontanous Breathing and Patient connected to nasal cannula oxygen  Post-op Assessment: Report given to RN and Post -op Vital signs reviewed and stable  Post vital signs: Reviewed and stable  Last Vitals:  Vitals Value Taken Time  BP    Temp    Pulse    Resp    SpO2      Last Pain:  Vitals:   05/03/24 1230  TempSrc:   PainSc: 8       Patients Stated Pain Goal: 5 (05/03/24 1204)  Complications: No notable events documented.

## 2024-05-03 NOTE — Anesthesia Preprocedure Evaluation (Addendum)
 Anesthesia Evaluation  Patient identified by MRN, date of birth, ID band Patient awake    Reviewed: Allergy & Precautions, H&P , NPO status , Patient's Chart, lab work & pertinent test results  History of Anesthesia Complications (+) PONV and history of anesthetic complications  Airway Mallampati: II  TM Distance: >3 FB Neck ROM: Full    Dental no notable dental hx.    Pulmonary pneumonia   Pulmonary exam normal breath sounds clear to auscultation       Cardiovascular negative cardio ROS Normal cardiovascular exam Rhythm:Regular Rate:Normal     Neuro/Psych  Headaches PSYCHIATRIC DISORDERS Anxiety Depression     Neuromuscular disease    GI/Hepatic Neg liver ROS,GERD  ,,  Endo/Other  negative endocrine ROS    Renal/GU negative Renal ROS  negative genitourinary   Musculoskeletal  (+)  Fibromyalgia -  Abdominal   Peds negative pediatric ROS (+)  Hematology negative hematology ROS (+)   Anesthesia Other Findings   Reproductive/Obstetrics negative OB ROS                             Anesthesia Physical Anesthesia Plan  ASA: 2  Anesthesia Plan: General   Post-op Pain Management:    Induction: Intravenous  PONV Risk Score and Plan:   Airway Management Planned: Nasal Cannula  Additional Equipment:   Intra-op Plan:   Post-operative Plan:   Informed Consent: I have reviewed the patients History and Physical, chart, labs and discussed the procedure including the risks, benefits and alternatives for the proposed anesthesia with the patient or authorized representative who has indicated his/her understanding and acceptance.     Dental advisory given  Plan Discussed with: CRNA  Anesthesia Plan Comments:        Anesthesia Quick Evaluation

## 2024-05-03 NOTE — Op Note (Signed)
 [facility] Patient Name: Nichole Brown Procedure Date: 05/03/2024 11:45 AM MRN: [id number] Date of Birth: Apr 30, 1966 Attending MD: Rolando Cliche. [name] , [place], [id number] CSN: [id number] Age: 58 Admit Type: Outpatient Procedure:                Colonoscopy Indications:              Surveillance: Personal history of adenomatous                            polyps on last colonoscopy > 5 years ago Providers:                Rolando Cliche. [name], DO, Willena Harp, Sharlette Dayhoff Technician, Technician Referring MD:              Medicines:                See the Anesthesia note for documentation of the                            administered medications Complications:            No immediate complications. Estimated Blood Loss:     Estimated blood loss was minimal. Procedure:                Pre-Anesthesia Assessment:                           - The anesthesia plan was to use monitored                            anesthesia care (MAC).                           After obtaining informed consent, the colonoscope                            was passed under direct vision. Throughout the                            procedure, the patient's blood pressure, pulse, and                            oxygen saturations were monitored continuously. The                            [id number]-HQ190L ([id number]) scope was introduced through                            the anus and advanced to the the cecum, identified                            by appendiceal orifice and ileocecal valve. The                            colonoscopy was performed  without difficulty. The                            patient tolerated the procedure well. The quality                            of the bowel preparation was evaluated using the                            BBPS [facility] Bowel Preparation Scale) with scores                            of: Right Colon = 3, Transverse Colon = 3 and Left                             Colon = 3 (entire mucosa seen well with no residual                            staining, small fragments of stool or opaque                            liquid). The total BBPS score equals 9. Scope In: 12:38:02 PM Scope Out: 12:51:37 PM Scope Withdrawal Time: 0 hours 9 minutes 49 seconds  Total Procedure Duration: 0 hours 13 minutes 35 seconds  Findings:      Non-bleeding internal hemorrhoids were found during retroflexion. The       hemorrhoids were moderate.      Scattered large-mouthed and small-mouthed diverticula were found in the       entire colon.      A 7 mm polyp was found in the ascending colon. The polyp was sessile.       The polyp was removed with a cold snare. Resection and retrieval were       complete. Impression:               - Non-bleeding internal hemorrhoids.                           - Diverticulosis in the entire examined colon.                           - One 7 mm polyp in the ascending colon, removed                            with a cold snare. Resected and retrieved. Moderate Sedation:      Per Anesthesia Care Recommendation:           - Patient has a contact number available for                            emergencies. The signs and symptoms of potential                            delayed complications were discussed with the  patient. Return to normal activities tomorrow.                            Written discharge instructions were provided to the                            patient.                           - Resume previous diet.                           - Continue present medications.                           - Await pathology results.                           - Repeat colonoscopy in 5 years for surveillance.                           - Return to GI clinic PRN. Procedure Code(s):        --- Professional ---                           [phone], Colonoscopy, flexible; with removal of                            tumor(s),  polyp(s), or other lesion(s) by snare                            technique Diagnosis Code(s):        --- Professional ---                           Z86.010, Personal history of colonic polyps                           K64.8, Other hemorrhoids                           D12.2, Benign neoplasm of ascending colon                           K57.30, Diverticulosis of large intestine without                            perforation or abscess without bleeding CPT copyright 2022 American Medical Association. All rights reserved. The codes documented in this report are preliminary and upon coder review may  be revised to meet current compliance requirements. Rolando Cliche. [name], DO Rolando Cliche. [name], DO 05/03/2024 12:54:51 PM This report has been signed electronically. Number of Addenda: 0

## 2024-05-03 NOTE — Discharge Instructions (Addendum)
   Colonoscopy Discharge Instructions  Read the instructions outlined below and refer to this sheet in the next few weeks. These discharge instructions provide you with general information on caring for yourself after you leave the hospital. Your doctor may also give you specific instructions. While your treatment has been planned according to the most current medical practices available, unavoidable complications occasionally occur.   ACTIVITY You may resume your regular activity, but move at a slower pace for the next 24 hours.  Take frequent rest periods for the next 24 hours.  Walking will help get rid of the air and reduce the bloated feeling in your belly (abdomen).  No driving for 24 hours (because of the medicine (anesthesia) used during the test).   Do not sign any important legal documents or operate any machinery for 24 hours (because of the anesthesia used during the test).  NUTRITION Drink plenty of fluids.  You may resume your normal diet as instructed by your doctor.  Begin with a light meal and progress to your normal diet. Heavy or fried foods are harder to digest and may make you feel sick to your stomach (nauseated).  Avoid alcoholic beverages for 24 hours or as instructed.  MEDICATIONS You may resume your normal medications unless your doctor tells you otherwise.  WHAT YOU CAN EXPECT TODAY Some feelings of bloating in the abdomen.  Passage of more gas than usual.  Spotting of blood in your stool or on the toilet paper.  IF YOU HAD POLYPS REMOVED DURING THE COLONOSCOPY: No aspirin products for 7 days or as instructed.  No alcohol for 7 days or as instructed.  Eat a soft diet for the next 24 hours.  FINDING OUT THE RESULTS OF YOUR TEST Not all test results are available during your visit. If your test results are not back during the visit, make an appointment with your caregiver to find out the results. Do not assume everything is normal if you have not heard from your  caregiver or the medical facility. It is important for you to follow up on all of your test results.  SEEK IMMEDIATE MEDICAL ATTENTION IF: You have more than a spotting of blood in your stool.  Your belly is swollen (abdominal distention).  You are nauseated or vomiting.  You have a temperature over 101.  You have abdominal pain or discomfort that is severe or gets worse throughout the day.   Your colonoscopy revealed 1 polyp(s) which I removed successfully. Await pathology results, my office will contact you. I recommend repeating colonoscopy in 5 years for surveillance purposes.  You also have diverticulosis and internal hemorrhoids. I would recommend increasing fiber in your diet or adding OTC Benefiber/Metamucil. Be sure to drink at least 4 to 6 glasses of water daily. Follow-up with GI as needed.   I hope you have a great rest of your week!  Hennie Duos. Marletta Lor, D.O. Gastroenterology and Hepatology [facility]

## 2024-05-03 NOTE — Anesthesia Postprocedure Evaluation (Signed)
 Anesthesia Post Note  Patient: Nichole Brown  Procedure(s) Performed: COLONOSCOPY  Patient location during evaluation: PACU Anesthesia Type: General Level of consciousness: awake and alert Pain management: pain level controlled Vital Signs Assessment: post-procedure vital signs reviewed and stable Respiratory status: spontaneous breathing, nonlabored ventilation, respiratory function stable and patient connected to nasal cannula oxygen Cardiovascular status: blood pressure returned to baseline and stable Postop Assessment: no apparent nausea or vomiting Anesthetic complications: no  No notable events documented.   Last Vitals:  Vitals:   05/03/24 1204 05/03/24 1256  BP: 124/84 (!) 134/94  Pulse: 65 68  Resp: (!) 23 16  Temp: 36.7 C 36.7 C  SpO2: 100% 100%    Last Pain:  Vitals:   05/03/24 1256  TempSrc: Oral  PainSc: 0-No pain                 Beacher Limerick

## 2024-05-05 LAB — URINE CULTURE

## 2024-05-06 ENCOUNTER — Other Ambulatory Visit: Payer: Self-pay

## 2024-05-06 ENCOUNTER — Ambulatory Visit: Payer: Self-pay | Admitting: Nurse Practitioner

## 2024-05-06 ENCOUNTER — Other Ambulatory Visit: Payer: Self-pay | Admitting: Nurse Practitioner

## 2024-05-06 MED ORDER — NITROFURANTOIN MONOHYD MACRO 100 MG PO CAPS
100.0000 mg | ORAL_CAPSULE | Freq: Two times a day (BID) | ORAL | 0 refills | Status: DC
  Filled 2024-05-06: qty 10, 5d supply, fill #0

## 2024-05-07 ENCOUNTER — Other Ambulatory Visit: Payer: Self-pay

## 2024-05-07 ENCOUNTER — Encounter (HOSPITAL_COMMUNITY): Payer: Self-pay | Admitting: Internal Medicine

## 2024-05-07 LAB — SURGICAL PATHOLOGY

## 2024-05-13 ENCOUNTER — Ambulatory Visit: Admitting: Adult Health

## 2024-05-13 ENCOUNTER — Encounter: Payer: Self-pay | Admitting: Adult Health

## 2024-05-13 VITALS — BP 100/64 | HR 71 | Ht 62.0 in | Wt 168.0 lb

## 2024-05-13 DIAGNOSIS — N362 Urethral caruncle: Secondary | ICD-10-CM | POA: Diagnosis not present

## 2024-05-13 DIAGNOSIS — Z01419 Encounter for gynecological examination (general) (routine) without abnormal findings: Secondary | ICD-10-CM | POA: Diagnosis not present

## 2024-05-13 DIAGNOSIS — L700 Acne vulgaris: Secondary | ICD-10-CM

## 2024-05-13 DIAGNOSIS — Z90721 Acquired absence of ovaries, unilateral: Secondary | ICD-10-CM | POA: Diagnosis not present

## 2024-05-13 DIAGNOSIS — N907 Vulvar cyst: Secondary | ICD-10-CM | POA: Diagnosis not present

## 2024-05-13 DIAGNOSIS — Z87440 Personal history of urinary (tract) infections: Secondary | ICD-10-CM | POA: Diagnosis not present

## 2024-05-13 DIAGNOSIS — F419 Anxiety disorder, unspecified: Secondary | ICD-10-CM | POA: Diagnosis not present

## 2024-05-13 DIAGNOSIS — Z90710 Acquired absence of both cervix and uterus: Secondary | ICD-10-CM

## 2024-05-13 LAB — POCT URINALYSIS DIPSTICK
Glucose, UA: NEGATIVE
Ketones, UA: NEGATIVE
Leukocytes, UA: NEGATIVE
Nitrite, UA: NEGATIVE
Protein, UA: NEGATIVE

## 2024-05-13 NOTE — Progress Notes (Signed)
 Patient ID: Nichole Brown, female   DOB: 05-07-1966, 58 y.o.   MRN: [id number] History of Present Illness: Nichole Brown is a 58 year old white female, married, sp hysterectomy in for a well woman gyn exam. She has had breast cancer, and has finished chemo and radiation.  PCP is Dr Debrah Fan  Current Medications, Allergies, Past Medical History, Past Surgical History, Family History and Social History were reviewed in [employer] electronic medical record.     Review of Systems: Patient denies any  hearing loss, fatigue, blurred vision, chest pain, abdominal pain, problems with bowel movements, urination, or intercourse(not active). No joint pain or mood swings.  Has noticed migraines longer and feels foggy Has shortness of breath at times, has seen PCP   Physical Exam:BP 100/64 (BP Location: Right Arm, Patient Position: Sitting, Cuff Size: Normal)   Pulse 71   Ht 5\' 2"  (1.575 m)   Wt 168 lb (76.2 kg)   BMI 30.73 kg/m  urine dipstick trace blood, had recent UTI  General:  Well developed, well nourished, no acute distress Skin:  Warm and dry Neck:  Midline trachea, normal thyroid , good ROM, no lymphadenopathy, no carotid  Lungs; Clear to auscultation bilaterally Breast:  No dominant palpable mass, retraction, or nipple discharge, left breast is small, has scar from lumpectomy and there is bruised looking area 5-7 0'clock has been there over a year Cardiovascular: Regular rate and rhythm Abdomen:  Soft, non tender, no hepatosplenomegaly Pelvic:  External genitalia is normal in appearance, has 2 epidermal cysts on right vulva and a closed comedone on left.  The vagina is pale. Urethra has small caruncle. The cervix and uterus absent.   No adnexal masses or tenderness noted.Bladder is non tender, no masses felt. Rectal:Deferred  Extremities/musculoskeletal:  No swelling or varicosities noted, no clubbing or cyanosis Psych:  No mood changes, alert and cooperative,seems happy AA is 1    05/13/2024     9:29 AM 02/26/2024    3:28 PM 02/06/2024    9:50 AM  Depression screen PHQ 2/9  Decreased Interest 0 0 0  Down, Depressed, Hopeless 0 0 0  PHQ - 2 Score 0 0 0  Altered sleeping 1 2 0  Tired, decreased energy 1 1 0  Change in appetite 1 1 0  Feeling bad or failure about yourself  0 0 0  Trouble concentrating 0 0 0  Moving slowly or fidgety/restless 0 0 0  Suicidal thoughts 0 0 0  PHQ-9 Score 3 4 0  Difficult doing work/chores   Not difficult at all       05/13/2024    9:30 AM 02/26/2024    3:28 PM 02/06/2024    9:50 AM 11/14/2023    3:33 PM  GAD 7 : Generalized Anxiety Score  Nervous, Anxious, on Edge 0 0 0 1  Control/stop worrying 0 0 0 0  Worry too much - different things 0 0 0 0  Trouble relaxing 1 1 0 1  Restless 0 0 0 1  Easily annoyed or irritable 0 0 0 0  Afraid - awful might happen 1 0 0 1  Total GAD 7 Score 2 1 0 4  Anxiety Difficulty   Not difficult at all       Upstream - 05/13/24 6578       Pregnancy Intention Screening   Does the patient want to become pregnant in the next year? N/A    Does the patient's partner want to become pregnant in the  next year? N/A    Would the patient like to discuss contraceptive options today? N/A      Contraception Wrap Up   Current Method Female Sterilization   hyst   End Method Female Sterilization   hyst   Contraception Counseling Provided No            Examination chaperoned by Alphonso Aschoff LPN  Impression and Plan: 1. History of UTI Have urine rechecked at physical with PCP - POCT Urinalysis Dipstick  2. Encounter for well woman exam with routine gynecological exam (Primary) GYN physical in 2 years Mammogram was negative 07/13/23 Colonoscopy was 05/03/24 good for 10 years  Labs with PCP  3. S/P hysterectomy with oophorectomy   4. Epidermal cyst of vulva If starts to get bigger and is bothersome, call to have excised   5. Urethral caruncle  6. Closed comedone Leave alone

## 2024-05-15 ENCOUNTER — Other Ambulatory Visit: Payer: Self-pay

## 2024-05-15 ENCOUNTER — Encounter: Payer: Self-pay | Admitting: Oncology

## 2024-05-15 ENCOUNTER — Other Ambulatory Visit: Payer: Self-pay | Admitting: Nurse Practitioner

## 2024-05-15 DIAGNOSIS — L400 Psoriasis vulgaris: Secondary | ICD-10-CM | POA: Diagnosis not present

## 2024-05-15 DIAGNOSIS — J300 Vasomotor rhinitis: Secondary | ICD-10-CM

## 2024-05-15 MED ORDER — CETIRIZINE-PSEUDOEPHEDRINE ER 5-120 MG PO TB12
1.0000 | ORAL_TABLET | Freq: Two times a day (BID) | ORAL | 3 refills | Status: AC
  Filled 2024-05-15: qty 48, 24d supply, fill #0
  Filled 2024-07-04: qty 72, 36d supply, fill #1
  Filled 2024-10-03: qty 48, 24d supply, fill #1

## 2024-05-15 MED FILL — Exemestane Tab 25 MG: ORAL | 30 days supply | Qty: 30 | Fill #2 | Status: AC

## 2024-05-16 ENCOUNTER — Ambulatory Visit: Payer: Self-pay | Admitting: Internal Medicine

## 2024-05-27 ENCOUNTER — Other Ambulatory Visit: Payer: Self-pay

## 2024-05-27 DIAGNOSIS — Z91199 Patient's noncompliance with other medical treatment and regimen due to unspecified reason: Secondary | ICD-10-CM | POA: Diagnosis not present

## 2024-05-27 DIAGNOSIS — Z79899 Other long term (current) drug therapy: Secondary | ICD-10-CM | POA: Diagnosis not present

## 2024-05-27 DIAGNOSIS — L4050 Arthropathic psoriasis, unspecified: Secondary | ICD-10-CM | POA: Diagnosis not present

## 2024-05-27 MED ORDER — SULFASALAZINE 500 MG PO TBEC
1000.0000 mg | DELAYED_RELEASE_TABLET | Freq: Two times a day (BID) | ORAL | 2 refills | Status: DC
  Filled 2024-05-27: qty 120, 30d supply, fill #0
  Filled 2024-07-04: qty 120, 30d supply, fill #1
  Filled 2024-10-20: qty 120, 30d supply, fill #2

## 2024-06-03 ENCOUNTER — Other Ambulatory Visit: Payer: Self-pay

## 2024-06-03 ENCOUNTER — Encounter: Payer: Self-pay | Admitting: Nurse Practitioner

## 2024-06-03 ENCOUNTER — Ambulatory Visit (INDEPENDENT_AMBULATORY_CARE_PROVIDER_SITE_OTHER): Admitting: Nurse Practitioner

## 2024-06-03 VITALS — BP 118/81 | HR 68 | Temp 98.2°F | Ht 62.0 in | Wt 169.0 lb

## 2024-06-03 DIAGNOSIS — R5383 Other fatigue: Secondary | ICD-10-CM | POA: Diagnosis not present

## 2024-06-03 DIAGNOSIS — R319 Hematuria, unspecified: Secondary | ICD-10-CM | POA: Diagnosis not present

## 2024-06-03 DIAGNOSIS — F5101 Primary insomnia: Secondary | ICD-10-CM | POA: Diagnosis not present

## 2024-06-03 DIAGNOSIS — R7303 Prediabetes: Secondary | ICD-10-CM | POA: Diagnosis not present

## 2024-06-03 LAB — POCT URINALYSIS DIP (CLINITEK)
Bilirubin, UA: NEGATIVE
Blood, UA: NEGATIVE
Glucose, UA: NEGATIVE mg/dL
Ketones, POC UA: NEGATIVE mg/dL
Leukocytes, UA: NEGATIVE
Nitrite, UA: NEGATIVE
POC PROTEIN,UA: NEGATIVE
Spec Grav, UA: 1.01 (ref 1.010–1.025)
Urobilinogen, UA: 0.2 U/dL
pH, UA: 6.5 (ref 5.0–8.0)

## 2024-06-03 MED ORDER — ALPRAZOLAM 0.5 MG PO TABS
0.5000 mg | ORAL_TABLET | Freq: Every evening | ORAL | 5 refills | Status: DC | PRN
  Filled 2024-06-03: qty 30, 30d supply, fill #0

## 2024-06-03 NOTE — Progress Notes (Unsigned)
 Subjective:    Patient ID: Nichole Brown, female    DOB: 01/17/1966, 58 y.o.   MRN: [id number]  HPI The patient comes in today for a wellness visit.    A review of their health history was completed.  A review of medications was also completed.  Any needed refills; no  Eating habits: fair; tries to follow a diabetic diet due to prediabetes  Falls/  MVA accidents in past few months: no  Regular exercise: walking  Specialist pt sees on regular basis: oncology q 3 months, rheumatologist, neurologist  Preventative health issues were discussed.   Additional concerns:   See GYN for physicals Colonoscopy, Mammogram and bone density up to date; regular vision and dental exams.  Main concern is trouble going and staying asleep. Chronic issue. Does have some anxiety, but trouble taking medications in the past. No relief with Melatonin or Trazodone . Low dose Alprazolam  has worked in the past. Limited caffeine. Good sleep hygiene.    Review of Systems  Constitutional:  Positive for fatigue. Negative for activity change and appetite change.  HENT:  Negative for sore throat and trouble swallowing.   Respiratory:  Negative for cough, chest tightness, shortness of breath and wheezing.   Cardiovascular:  Negative for chest pain.  Gastrointestinal:  Negative for abdominal distention, abdominal pain, constipation, diarrhea, nausea and vomiting.  Genitourinary:  Negative for difficulty urinating, dysuria, frequency, hematuria and urgency.       Needs repeat UA today to check for hematuria.        06/03/2024    8:37 AM  Depression screen PHQ 2/9  Decreased Interest 0  Down, Depressed, Hopeless 0  PHQ - 2 Score 0  Altered sleeping 1  Tired, decreased energy 1  Change in appetite 1  Feeling bad or failure about yourself  0  Trouble concentrating 0  Moving slowly or fidgety/restless 0  Suicidal thoughts 0  PHQ-9 Score 3  Difficult doing work/chores Not difficult at all       06/03/2024    8:37 AM 05/13/2024    9:30 AM 02/26/2024    3:28 PM 02/06/2024    9:50 AM  GAD 7 : Generalized Anxiety Score  Nervous, Anxious, on Edge 1 0 0 0  Control/stop worrying 1 0 0 0  Worry too much - different things 1 0 0 0  Trouble relaxing 1 1 1  0  Restless 0 0 0 0  Easily annoyed or irritable 0 0 0 0  Afraid - awful might happen 1 1 0 0  Total GAD 7 Score 5 2 1  0  Anxiety Difficulty Somewhat difficult   Not difficult at all        Objective:   Physical Exam Vitals and nursing note reviewed.  Constitutional:      General: She is not in acute distress.    Appearance: She is well-developed.  Neck:     Thyroid : No thyromegaly.     Trachea: No tracheal deviation.     Comments: Thyroid  non tender to palpation. No mass or goiter noted.  Cardiovascular:     Rate and Rhythm: Normal rate and regular rhythm.     Heart sounds: Normal heart sounds. No murmur heard. Pulmonary:     Effort: Pulmonary effort is normal.     Breath sounds: Normal breath sounds.  Abdominal:     General: There is no distension.     Palpations: Abdomen is soft.     Tenderness: There is no abdominal tenderness.  Musculoskeletal:     Cervical back: Normal range of motion and neck supple.  Lymphadenopathy:     Cervical: No cervical adenopathy.     Upper Body:     Right upper body: No supraclavicular adenopathy.     Left upper body: No supraclavicular adenopathy.   Skin:    General: Skin is warm and dry.     Findings: No rash.   Neurological:     Mental Status: She is alert and oriented to person, place, and time.   Psychiatric:        Mood and Affect: Mood normal.        Behavior: Behavior normal.        Thought Content: Thought content normal.        Judgment: Judgment normal.    Today's Vitals   06/03/24 0823  BP: 118/81  Pulse: 68  Temp: 98.2 F (36.8 C)  SpO2: 99%  Weight: 169 lb (76.7 kg)  Height: 5' 2 (1.575 m)   Body mass index is 30.91 kg/m.  Results for orders placed  or performed in visit on 06/03/24  POCT URINALYSIS DIP (CLINITEK)   Collection Time: 06/03/24  8:42 AM  Result Value Ref Range   Color, UA yellow yellow   Clarity, UA clear clear   Glucose, UA negative negative mg/dL   Bilirubin, UA negative negative   Ketones, POC UA negative negative mg/dL   Spec Grav, UA 8.989 8.989 - 1.025   Blood, UA negative negative   pH, UA 6.5 5.0 - 8.0   POC PROTEIN,UA negative negative, trace   Urobilinogen, UA 0.2 0.2 or 1.0 E.U./dL   Nitrite, UA Negative Negative   Leukocytes, UA Negative Negative          Assessment & Plan:   Problem List Items Addressed This Visit       Other   Insomnia   Prediabetes   Relevant Orders   Hemoglobin A1c (Completed)   Lipid panel (Completed)   Other Visit Diagnoses       Hematuria, unspecified type    -  Primary   Relevant Orders   POCT URINALYSIS DIP (CLINITEK) (Completed)     Fatigue, unspecified type       Relevant Orders   TSH (Completed)      Hematuria resolved on UA. Routine labs ordered that have not been done by specialists.  Meds ordered this encounter  Medications   ALPRAZolam  (XANAX ) 0.5 MG tablet    Sig: Take 1 tablet (0.5 mg total) by mouth at bedtime as needed.    Dispense:  30 tablet    Refill:  5    Supervising Provider:   ALPHONSA HAMILTON A [[id number]]   Trial of low dose Alprazolam  for sleep since this has worked in the past. Call back if no improvement over the next few weeks.  Continue activity. Encouraged healthy diet low in simple carbs; increase whole foods and lean protein.  Return in about 6 months (around 12/03/2024).

## 2024-06-03 NOTE — Patient Instructions (Signed)
 Capsaicin cream Tiger Balm

## 2024-06-04 ENCOUNTER — Encounter: Payer: Self-pay | Admitting: Nurse Practitioner

## 2024-06-04 ENCOUNTER — Ambulatory Visit: Payer: Self-pay | Admitting: Nurse Practitioner

## 2024-06-04 ENCOUNTER — Other Ambulatory Visit: Payer: Self-pay | Admitting: Oncology

## 2024-06-04 DIAGNOSIS — Z853 Personal history of malignant neoplasm of breast: Secondary | ICD-10-CM

## 2024-06-04 LAB — HEMOGLOBIN A1C
Est. average glucose Bld gHb Est-mCnc: 126 mg/dL
Hgb A1c MFr Bld: 6 % — ABNORMAL HIGH (ref 4.8–5.6)

## 2024-06-04 LAB — LIPID PANEL
Chol/HDL Ratio: 4.5 ratio — ABNORMAL HIGH (ref 0.0–4.4)
Cholesterol, Total: 179 mg/dL (ref 100–199)
HDL: 40 mg/dL (ref >39)
LDL Chol Calc (NIH): 116 mg/dL — ABNORMAL HIGH (ref 0–99)
Triglycerides: 125 mg/dL (ref 0–149)
VLDL Cholesterol Cal: 23 mg/dL (ref 5–40)

## 2024-06-04 LAB — TSH: TSH: 3.02 u[IU]/mL (ref 0.450–4.500)

## 2024-06-07 ENCOUNTER — Other Ambulatory Visit: Payer: Self-pay

## 2024-06-07 MED ORDER — SHINGRIX 50 MCG/0.5ML IM SUSR
0.5000 mL | Freq: Once | INTRAMUSCULAR | 1 refills | Status: AC
  Filled 2024-06-07: qty 0.5, 1d supply, fill #0
  Filled 2024-09-20: qty 0.5, 1d supply, fill #1

## 2024-06-10 MED FILL — Exemestane Tab 25 MG: ORAL | 30 days supply | Qty: 30 | Fill #3 | Status: AC

## 2024-06-17 ENCOUNTER — Inpatient Hospital Stay: Attending: Oncology | Admitting: *Deleted

## 2024-06-17 DIAGNOSIS — Z9189 Other specified personal risk factors, not elsewhere classified: Secondary | ICD-10-CM

## 2024-06-17 DIAGNOSIS — C50412 Malignant neoplasm of upper-outer quadrant of left female breast: Secondary | ICD-10-CM

## 2024-06-17 DIAGNOSIS — F419 Anxiety disorder, unspecified: Secondary | ICD-10-CM | POA: Diagnosis not present

## 2024-06-17 NOTE — Progress Notes (Signed)
 SUBJECTIVE: Pt returns for her 3 month L-Dex screen.    PAIN:  Are you having pain? No   SOZO SCREENING: Patient was assessed today using the SOZO machine to determine the lymphedema index score. This was compared to her baseline score. It was determined that she is within the recommended range when compared to her baseline and no further action is needed at this time. She will continue SOZO screenings. These are done every 3 months for 2 years post operatively followed by every 6 months for 2 years, and then annually.     L-DEX FLOWSHEETS                L-DEX LYMPHEDEMA SCREENING    Measurement Type Unilateral     L-DEX MEASUREMENT EXTREMITY Upper Extremity     POSITION  Standing     DOMINANT SIDE Right     At Risk Side Left     BASELINE SCORE (UNILATERAL) -3.9    L-DEX SCORE (UNILATERAL) -6.9    VALUE CHANGE (UNILAT) -3.0

## 2024-07-02 ENCOUNTER — Encounter: Payer: Self-pay | Admitting: Nurse Practitioner

## 2024-07-04 ENCOUNTER — Other Ambulatory Visit: Payer: Self-pay

## 2024-07-04 ENCOUNTER — Encounter: Payer: Self-pay | Admitting: Oncology

## 2024-07-04 MED ORDER — XIIDRA 5 % OP SOLN
1.0000 [drp] | Freq: Two times a day (BID) | OPHTHALMIC | 11 refills | Status: AC
  Filled 2024-07-04: qty 180, 90d supply, fill #0
  Filled 2024-07-19: qty 60, 30d supply, fill #0
  Filled 2024-11-05 – 2024-12-16 (×2): qty 60, 30d supply, fill #1
  Filled 2024-12-18: qty 60, 30d supply, fill #0

## 2024-07-05 ENCOUNTER — Other Ambulatory Visit: Payer: Self-pay

## 2024-07-05 ENCOUNTER — Ambulatory Visit: Admitting: Physician Assistant

## 2024-07-05 ENCOUNTER — Encounter: Payer: Self-pay | Admitting: Physician Assistant

## 2024-07-05 VITALS — BP 104/76 | HR 60 | Temp 98.1°F | Ht 62.0 in | Wt 167.4 lb

## 2024-07-05 DIAGNOSIS — M79609 Pain in unspecified limb: Secondary | ICD-10-CM

## 2024-07-05 DIAGNOSIS — R202 Paresthesia of skin: Secondary | ICD-10-CM

## 2024-07-05 MED ORDER — PREGABALIN 150 MG PO CAPS
150.0000 mg | ORAL_CAPSULE | Freq: Two times a day (BID) | ORAL | 1 refills | Status: DC
  Filled 2024-07-05: qty 60, 30d supply, fill #0

## 2024-07-05 NOTE — Assessment & Plan Note (Signed)
 Patient presents today with complaints of pain and paresthesia in her right lower leg. She has had significant work up in the past that has been reassuring. Etiology is unclear at this time. Symptoms have been uncontrolled. Previously started on pregabalin . Increasing dose to 150 mg bid. Patient to follow up with Dr. Bluford in approximately 2 weeks for re-evaluation. Warning signs and follow up precautions discussed.

## 2024-07-05 NOTE — Progress Notes (Signed)
   Acute Office Visit  Subjective:     Patient ID: Nichole Brown, female    DOB: 06/09/1966, 58 y.o.   MRN: [id number]   Patient presents today with complaints of paresthesias and pain on the anterior aspect of her right lower leg. On chart review, this has been an ongoing concern for years with normal and reassuring workup thus far. Patient relates previous MRI and nerve conduction studies, as well as negative DVT workup. She was seen by Dr. Bluford in March for similar concerns. She was started on pregabalin  at that time, today she reports minimal response to medication. She reports intermittent swelling of right lower leg and intermittent pain behind her knee. She relates symptoms are minimal today. She denies chest pain or shortness of breath. Denies recent travel.      Review of Systems  Constitutional:  Negative for fever, malaise/fatigue and weight loss.  Respiratory:  Negative for sputum production.   Cardiovascular:  Negative for chest pain.  Musculoskeletal:  Negative for myalgias.  Neurological:  Positive for tingling and sensory change.  Endo/Heme/Allergies:  Does not bruise/bleed easily.        Objective:     BP 104/76   Pulse 60   Temp 98.1 F (36.7 C)   Ht 5' 2 (1.575 m)   Wt 167 lb 6.4 oz (75.9 kg)   SpO2 97%   BMI 30.62 kg/m   Physical Exam Constitutional:      Appearance: Normal appearance.  HENT:     Head: Normocephalic.     Mouth/Throat:     Mouth: Mucous membranes are moist.     Pharynx: Oropharynx is clear.  Eyes:     Extraocular Movements: Extraocular movements intact.     Conjunctiva/sclera: Conjunctivae normal.  Cardiovascular:     Rate and Rhythm: Normal rate and regular rhythm.     Heart sounds: Normal heart sounds. No murmur heard. Pulmonary:     Effort: Pulmonary effort is normal.     Breath sounds: Normal breath sounds. No wheezing or rales.  Musculoskeletal:     Right lower leg: No tenderness. No edema.     Left lower leg: No  edema.     Comments: No erythema   Skin:    General: Skin is warm and dry.  Neurological:     General: No focal deficit present.     Mental Status: She is alert and oriented to person, place, and time.  Psychiatric:        Mood and Affect: Mood normal.        Behavior: Behavior normal.     No results found for any visits on 07/05/24.      Assessment & Plan:  Paresthesia and pain of right extremity Assessment & Plan: Patient presents today with complaints of pain and paresthesia in her right lower leg. She has had significant work up in the past that has been reassuring. Etiology is unclear at this time. Symptoms have been uncontrolled. Previously started on pregabalin . Increasing dose to 150 mg bid. Patient to follow up with Dr. Bluford in approximately 2 weeks for re-evaluation. Warning signs and follow up precautions discussed.  Orders: -     Pregabalin ; Take 1 capsule (150 mg total) by mouth 2 (two) times daily.  Dispense: 60 capsule; Refill: 1    Return in about 2 weeks (around 07/19/2024).  Charmaine Shayli Altemose, PA-C

## 2024-07-09 ENCOUNTER — Other Ambulatory Visit: Payer: Self-pay

## 2024-07-15 ENCOUNTER — Ambulatory Visit
Admission: RE | Admit: 2024-07-15 | Discharge: 2024-07-15 | Disposition: A | Discharge status: Home / Self Care | Source: Ambulatory Visit | Attending: Oncology | Admitting: Oncology

## 2024-07-15 ENCOUNTER — Other Ambulatory Visit: Payer: Self-pay | Admitting: Oncology

## 2024-07-15 DIAGNOSIS — N6320 Unspecified lump in the left breast, unspecified quadrant: Secondary | ICD-10-CM

## 2024-07-15 DIAGNOSIS — R92333 Mammographic heterogeneous density, bilateral breasts: Secondary | ICD-10-CM | POA: Diagnosis not present

## 2024-07-15 DIAGNOSIS — Z853 Personal history of malignant neoplasm of breast: Secondary | ICD-10-CM | POA: Diagnosis not present

## 2024-07-19 ENCOUNTER — Ambulatory Visit: Admitting: Cardiology

## 2024-07-19 ENCOUNTER — Other Ambulatory Visit: Payer: Self-pay

## 2024-07-22 ENCOUNTER — Encounter: Payer: Self-pay | Admitting: Oncology

## 2024-07-23 ENCOUNTER — Ambulatory Visit

## 2024-07-23 ENCOUNTER — Other Ambulatory Visit

## 2024-07-23 ENCOUNTER — Ambulatory Visit: Admitting: Oncology

## 2024-07-24 ENCOUNTER — Ambulatory Visit: Admitting: Cardiology

## 2024-07-26 ENCOUNTER — Other Ambulatory Visit (INDEPENDENT_AMBULATORY_CARE_PROVIDER_SITE_OTHER): Payer: Self-pay | Admitting: Nurse Practitioner

## 2024-07-26 DIAGNOSIS — M79609 Pain in unspecified limb: Secondary | ICD-10-CM

## 2024-07-29 ENCOUNTER — Ambulatory Visit: Admitting: Family Medicine

## 2024-07-29 ENCOUNTER — Inpatient Hospital Stay: Attending: Oncology

## 2024-07-29 ENCOUNTER — Other Ambulatory Visit: Payer: Self-pay

## 2024-07-29 ENCOUNTER — Inpatient Hospital Stay (HOSPITAL_BASED_OUTPATIENT_CLINIC_OR_DEPARTMENT_OTHER): Admitting: Oncology

## 2024-07-29 ENCOUNTER — Encounter: Payer: Self-pay | Admitting: Oncology

## 2024-07-29 ENCOUNTER — Inpatient Hospital Stay

## 2024-07-29 VITALS — BP 118/68 | HR 78 | Temp 97.0°F | Resp 18 | Ht 62.0 in | Wt 168.7 lb

## 2024-07-29 DIAGNOSIS — Z170 Estrogen receptor positive status [ER+]: Secondary | ICD-10-CM | POA: Insufficient documentation

## 2024-07-29 DIAGNOSIS — C50412 Malignant neoplasm of upper-outer quadrant of left female breast: Secondary | ICD-10-CM | POA: Diagnosis not present

## 2024-07-29 DIAGNOSIS — Z79899 Other long term (current) drug therapy: Secondary | ICD-10-CM

## 2024-07-29 DIAGNOSIS — Z9221 Personal history of antineoplastic chemotherapy: Secondary | ICD-10-CM | POA: Diagnosis not present

## 2024-07-29 DIAGNOSIS — Z853 Personal history of malignant neoplasm of breast: Secondary | ICD-10-CM

## 2024-07-29 DIAGNOSIS — Z08 Encounter for follow-up examination after completed treatment for malignant neoplasm: Secondary | ICD-10-CM

## 2024-07-29 DIAGNOSIS — Z79811 Long term (current) use of aromatase inhibitors: Secondary | ICD-10-CM | POA: Diagnosis not present

## 2024-07-29 DIAGNOSIS — Z5181 Encounter for therapeutic drug level monitoring: Secondary | ICD-10-CM | POA: Diagnosis not present

## 2024-07-29 DIAGNOSIS — Z7983 Long term (current) use of bisphosphonates: Secondary | ICD-10-CM

## 2024-07-29 DIAGNOSIS — Z1721 Progesterone receptor positive status: Secondary | ICD-10-CM | POA: Insufficient documentation

## 2024-07-29 DIAGNOSIS — Z1732 Human epidermal growth factor receptor 2 negative status: Secondary | ICD-10-CM | POA: Diagnosis not present

## 2024-07-29 DIAGNOSIS — Z923 Personal history of irradiation: Secondary | ICD-10-CM | POA: Insufficient documentation

## 2024-07-29 LAB — CBC WITH DIFFERENTIAL (CANCER CENTER ONLY)
Abs Immature Granulocytes: 0.02 K/uL (ref 0.00–0.07)
Basophils Absolute: 0.1 K/uL (ref 0.0–0.1)
Basophils Relative: 1 %
Eosinophils Absolute: 0.3 K/uL (ref 0.0–0.5)
Eosinophils Relative: 4 %
HCT: 40.2 % (ref 36.0–46.0)
Hemoglobin: 13.1 g/dL (ref 12.0–15.0)
Immature Granulocytes: 0 %
Lymphocytes Relative: 38 %
Lymphs Abs: 2.8 K/uL (ref 0.7–4.0)
MCH: 29.6 pg (ref 26.0–34.0)
MCHC: 32.6 g/dL (ref 30.0–36.0)
MCV: 91 fL (ref 80.0–100.0)
Monocytes Absolute: 0.6 K/uL (ref 0.1–1.0)
Monocytes Relative: 8 %
Neutro Abs: 3.6 K/uL (ref 1.7–7.7)
Neutrophils Relative %: 49 %
Platelet Count: 216 K/uL (ref 150–400)
RBC: 4.42 MIL/uL (ref 3.87–5.11)
RDW: 13.4 % (ref 11.5–15.5)
WBC Count: 7.3 K/uL (ref 4.0–10.5)
nRBC: 0 % (ref 0.0–0.2)

## 2024-07-29 LAB — CMP (CANCER CENTER ONLY)
ALT: 19 U/L (ref 0–44)
AST: 21 U/L (ref 15–41)
Albumin: 4.3 g/dL (ref 3.5–5.0)
Alkaline Phosphatase: 49 U/L (ref 38–126)
Anion gap: 8 (ref 5–15)
BUN: 15 mg/dL (ref 6–20)
CO2: 23 mmol/L (ref 22–32)
Calcium: 9.3 mg/dL (ref 8.9–10.3)
Chloride: 104 mmol/L (ref 98–111)
Creatinine: 0.76 mg/dL (ref 0.44–1.00)
GFR, Estimated: >60 mL/min (ref >60)
Glucose, Bld: 105 mg/dL — ABNORMAL HIGH (ref 70–99)
Potassium: 3.5 mmol/L (ref 3.5–5.1)
Sodium: 135 mmol/L (ref 135–145)
Total Bilirubin: 0.5 mg/dL (ref 0.0–1.2)
Total Protein: 6.9 g/dL (ref 6.5–8.1)

## 2024-07-29 MED ORDER — ZOLEDRONIC ACID 4 MG/100ML IV SOLN
4.0000 mg | INTRAVENOUS | Status: DC
  Administered 2024-07-29: 4 mg via INTRAVENOUS
  Filled 2024-07-29: qty 100

## 2024-07-29 NOTE — Patient Instructions (Signed)

## 2024-07-30 ENCOUNTER — Other Ambulatory Visit (INDEPENDENT_AMBULATORY_CARE_PROVIDER_SITE_OTHER)

## 2024-07-30 ENCOUNTER — Ambulatory Visit (INDEPENDENT_AMBULATORY_CARE_PROVIDER_SITE_OTHER)

## 2024-07-30 ENCOUNTER — Ambulatory Visit (INDEPENDENT_AMBULATORY_CARE_PROVIDER_SITE_OTHER): Admitting: Vascular Surgery

## 2024-07-30 ENCOUNTER — Encounter (INDEPENDENT_AMBULATORY_CARE_PROVIDER_SITE_OTHER): Payer: Self-pay | Admitting: Vascular Surgery

## 2024-07-30 ENCOUNTER — Other Ambulatory Visit (INDEPENDENT_AMBULATORY_CARE_PROVIDER_SITE_OTHER): Payer: Self-pay | Admitting: Nurse Practitioner

## 2024-07-30 ENCOUNTER — Encounter (INDEPENDENT_AMBULATORY_CARE_PROVIDER_SITE_OTHER)

## 2024-07-30 VITALS — BP 115/76 | HR 68 | Resp 18 | Ht 62.0 in | Wt 167.8 lb

## 2024-07-30 DIAGNOSIS — R202 Paresthesia of skin: Secondary | ICD-10-CM

## 2024-07-30 DIAGNOSIS — M79609 Pain in unspecified limb: Secondary | ICD-10-CM

## 2024-07-30 DIAGNOSIS — M79604 Pain in right leg: Secondary | ICD-10-CM | POA: Diagnosis not present

## 2024-07-30 NOTE — Progress Notes (Signed)
 Subjective:    Patient ID: Nichole Brown, female    DOB: [birth year], 58 y.o.   MRN: [id number] Chief Complaint  Patient presents with   Follow-up    Danita and dvt study follow up    Nichole Brown is a 58 year old female that presents today for evaluation of right lower extremity leg pain.  She was last seen by vein and vascular surgery on 12/02/2021. The patient has had this pain for approximately 3 years now.  She notes that the pain is constant with flares where her skin to the anterior portion of her lower extremity gets red and hot..  There is no difference in pain when she is walking or resting.  She describes the pain as a slight numbness/electrical feeling that shoots from her heel.  She recently saw Dr. Ashley and she was diagnosed with plantar fasciitis.  She has had 2 steroid shots that helped for about 2 or 3 weeks.  She is also tried other conservative treatments for plantar fasciitis including ice and elevation but this is not cause any drastic difference in her pain.  She also notes that she had a PFO when she was younger.  Initially this did present with some swelling and redness and a DVT study was done and this was negative.   Today noninvasive studies of her right lower extremity show no evidence of DVT or superficial thrombophlebitis bilaterally.  No evidence of deep venous insufficiency bilaterally.  No evidence of superficial venous reflux in the short saphenous veins bilaterally. No studies were completed on her left lower extremity.   Today the patient has an ABI of 1.29 on the right and 1.28 on the left.  She has normal toe pressures.  She also has strong triphasic tibial artery waveforms with good toe waveforms bilaterally.     Review of Systems  Constitutional: Negative.   Musculoskeletal:  Positive for myalgias.       Right lower extremity.   Skin:  Positive for color change.       Area with redness and becomes warm  All other systems reviewed and are  negative.      Objective:   Physical Exam Vitals reviewed.  Constitutional:      Appearance: Normal appearance. She is normal weight.  HENT:     Head: Normocephalic.  Eyes:     Pupils: Pupils are equal, round, and reactive to light.  Cardiovascular:     Rate and Rhythm: Normal rate and regular rhythm.     Pulses: Normal pulses.     Heart sounds: Normal heart sounds.  Pulmonary:     Effort: Pulmonary effort is normal.     Breath sounds: Normal breath sounds.  Abdominal:     General: Abdomen is flat. Bowel sounds are normal.     Palpations: Abdomen is soft.  Musculoskeletal:        General: Normal range of motion.  Skin:    General: Skin is warm and dry.     Capillary Refill: Capillary refill takes 2 to 3 seconds.  Neurological:     General: No focal deficit present.     Mental Status: She is alert and oriented to person, place, and time. Mental status is at baseline.  Psychiatric:        Mood and Affect: Mood normal.        Behavior: Behavior normal.        Thought Content: Thought content normal.        Judgment:  Judgment normal.     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   Complication of anesthesia    PONV   Contusion of leg 10/06/2015   Depression    GERD (gastroesophageal reflux disease)    Insomnia    Migraine headache    Pneumonia    2000   PONV (postoperative nausea and vomiting)     Social History   Socioeconomic History   Marital status: Married    Spouse name: daniel   Number of children: 4   Years of education: Not on file   Highest education level: Associate degree: occupational, Scientist, product/process development, or vocational program  Occupational History   Not on file  Tobacco Use   Smoking status: Never   Smokeless tobacco: Never  Vaping Use   Vaping status: Never Used  Substance  and Sexual Activity   Alcohol use: Yes    Comment: occassionally-not since chemo started   Drug use: No   Sexual activity: Not Currently    Birth control/protection: Surgical    Comment: hyst  Other Topics Concern   Not on file  Social History Narrative   Not on file   Social Drivers of Health   Financial Resource Strain: Low Risk  (05/13/2024)   Overall Financial Resource Strain (CARDIA)    Difficulty of Paying Living Expenses: Not hard at all  Food Insecurity: No Food Insecurity (05/13/2024)   Hunger Vital Sign    Worried About Running Out of Food in the Last Year: Never true    Ran Out of Food in the Last Year: Never true  Transportation Needs: No Transportation Needs (05/13/2024)   PRAPARE - Administrator, Civil Service (Medical): No    Lack of Transportation (Non-Medical): No  Physical Activity: Insufficiently Active (05/13/2024)   Exercise Vital Sign    Days of Exercise per Week: 3 days    Minutes of Exercise per Session: 30 min  Stress: No Stress Concern Present (05/13/2024)   [employer] of Occupational Health - Occupational Stress Questionnaire    Feeling of Stress : Only a little  Recent Concern: Stress - Stress Concern Present (04/30/2024)   [employer] of Occupational Health - Occupational Stress Questionnaire    Feeling of Stress : Very much  Social Connections: Socially Integrated (05/13/2024)   Social Connection and Isolation Panel    Frequency of Communication with Friends and Family: Twice a week    Frequency of Social Gatherings with Friends and Family: More than three times a week    Attends Religious Services: More than 4 times per year    Active Member of [employer] or Organizations: Yes    Attends Banker Meetings: More than 4 times per year    Marital Status: Married  Catering manager Violence: Not At Risk (05/13/2024)   Humiliation, Afraid, Rape, and Kick questionnaire    Fear of Current or Ex-Partner: No    Emotionally Abused: No     Physically Abused: No    Sexually Abused: No    Past Surgical History:  Procedure Laterality Date   ABDOMINAL HYSTERECTOMY     age [age]   BALLOON DILATION N/A 12/14/2020   Procedure: BALLOON DILATION;  Surgeon:  Cindie Carlin POUR, DO;  Location: [facility];  Service: Endoscopy;  Laterality: N/A;   BIOPSY  12/14/2020   Procedure: BIOPSY;  Surgeon: Cindie Carlin POUR, DO;  Location: [facility];  Service: Endoscopy;;   BREAST BIOPSY Left 08/24/2022   us  bx, coil marker +   BREAST LUMPECTOMY WITH RADIOACTIVE SEED AND SENTINEL LYMPH NODE BIOPSY Left 09/29/2022   Invasive ductal carcinoma, grade 2, pT1b, pN0  - Margins are negative for carcinoma in situ or invasive carcinoma   CESAREAN SECTION     CHOLECYSTECTOMY  1988   COLONOSCOPY N/A 12/10/2018    six 4-8 mm polyps in proximal descending colon, mid transverse and hepatic flexure. Diverticulosis. External and internal hemorrhoids. Torturous colon. Two simple adenomas, two serrated polyps, 2 hyperplastic polyps. Surveillance due Dec 2022.    COLONOSCOPY N/A 05/03/2024   Procedure: COLONOSCOPY;  Surgeon: Cindie Carlin POUR, DO;  Location: [facility];  Service: Endoscopy;  Laterality: N/A;  1:00PM, ASA 2   ESOPHAGOGASTRODUODENOSCOPY (EGD) WITH PROPOFOL  N/A 12/14/2020   Benign-appearing esophageal stenosis s/p dilation, gastritis, normal duodenum. Mild chronic gastritis.    open heart surgery  1967   PATENT DUCTUS ARTERIOUS REPAIR  1967   POLYPECTOMY  12/10/2018   Procedure: POLYPECTOMY;  Surgeon: Harvey Margo CROME, MD;  Location: [facility];  Service: Endoscopy;;  hepatic flexure, descending, transverse   PORT-A-CATH REMOVAL Right 03/14/2023   Procedure: REMOVAL PORT-A-CATH;  Surgeon: Ebbie Cough, MD;  Location: [facility];  Service: General;  Laterality: Right;   PORTACATH PLACEMENT N/A 11/14/2022   Procedure: PORT PLACEMENT WITH ULTRASOUND GUIDANCE;  Surgeon: Ebbie Cough, MD;  Location:   SURGERY CENTER;  Service: General;  Laterality: N/A;   SHOULDER ARTHROSCOPY Left 06/16/2017   Procedure: LEFT SHOULDER ARTHROSCOPY, DEBRIDEMENT, AND DECOMPRESSION;  Surgeon: Harden Jerona GAILS, MD;  Location: [facility];  Service: Orthopedics;  Laterality: Left;   SHOULDER ARTHROSCOPY WITH SUBACROMIAL DECOMPRESSION Left 10/03/2019   Procedure: SHOULDER ARTHROSCOPY WITH DEBRIDEMENT, DECOMPRESSION;  Surgeon: Edie Norleen PARAS, MD;  Location: [facility];  Service: Orthopedics;  Laterality: Left;   SHOULDER SURGERY Left 2008   AC joint    Family History  Problem Relation Age of Onset   Hypertension Maternal Grandmother    Diabetes Maternal Grandmother    Heart disease Maternal Grandmother 24       MI   Hypertension Maternal Grandfather    Throat cancer Maternal Grandfather 61   Hypertension Father    Breast cancer Mother 15   Colon cancer Paternal Uncle        dx 56s   Mental illness Son    Migraines Daughter     Allergies  Allergen Reactions   Cymbalta  [Duloxetine  Hcl] Other (See Comments)    Hallucinations; sleep walking   Nortriptyline  Nausea And Vomiting   Phenergan  [Promethazine  Hcl]     Restless legs and cramping       Latest Ref Rng & Units 07/29/2024    1:24 PM 04/15/2024   12:50 PM 01/08/2024   12:51 PM  CBC  WBC 4.0 - 10.5 K/uL 7.3  8.8  7.8   Hemoglobin 12.0 - 15.0 g/dL 86.8  87.0  87.1   Hematocrit 36.0 - 46.0 % 40.2  38.6  38.4   Platelets 150 - 400 K/uL 216  217  230       CMP     Component Value Date/Time   NA 135 07/29/2024 1324   NA 143 09/22/2014 0615   K  3.5 07/29/2024 1324   K 4.0 09/22/2014 0615   CL 104 07/29/2024 1324   CL 108 (H) 09/22/2014 0615   CO2 23 07/29/2024 1324   CO2 28 09/22/2014 0615   GLUCOSE 105 (H) 07/29/2024 1324   GLUCOSE 84 09/22/2014 0615   BUN 15 07/29/2024 1324   BUN 12 09/22/2014 0615   CREATININE 0.76 07/29/2024 1324   CREATININE 0.65 09/22/2014 0615   CALCIUM  9.3 07/29/2024 1324   CALCIUM  8.4 (L) 09/22/2014 0615   PROT 6.9  07/29/2024 1324   PROT 6.7 09/22/2014 0615   ALBUMIN 4.3 07/29/2024 1324   ALBUMIN 3.5 09/22/2014 0615   AST 21 07/29/2024 1324   ALT 19 07/29/2024 1324   ALT 36 09/22/2014 0615   ALKPHOS 49 07/29/2024 1324   ALKPHOS 83 09/22/2014 0615   BILITOT 0.5 07/29/2024 1324   GFRNONAA >60 07/29/2024 1324   GFRNONAA >60 09/22/2014 0615     No results found.     Assessment & Plan:   1. Leg pain, diffuse, right (Primary) Recommend:   I do not find evidence of Vascular pathology that would explain the patient's symptoms   The patient has atypical pain symptoms for vascular disease   I do not find evidence of Vascular pathology that would explain the patient's symptoms and I suspect the patient is c/o pseudoclaudication.  Patient should have an evaluation of her skin and allergies which I defer to the primary service to order.   Noninvasive studies including venous ultrasound of the legs do not identify vascular problems   The patient should continue walking and begin a more formal exercise program. The patient should continue his antiplatelet therapy and aggressive treatment of the lipid abnormalities. The patient should begin wearing graduated compression socks 15-20 mmHg strength to control her mild edema.   Patient will follow-up with me on a PRN basis   Further work-up of her lower extremity pain is deferred to the primary service    Current Outpatient Medications on File Prior to Visit  Medication Sig Dispense Refill   acetaminophen  (TYLENOL ) 500 MG tablet Take 500 mg by mouth every 6 (six) hours as needed for moderate pain.     ALPRAZolam  (XANAX ) 0.5 MG tablet Take 1 tablet (0.5 mg total) by mouth at bedtime as needed. 30 tablet 5   Calcium -Magnesium-Vitamin D  (CALCIUM  1200+D3 PO) Take 1 Capful by mouth daily.     cetirizine -pseudoephedrine  (ALLERGY RELIEF/NASAL DECONGEST) 5-120 MG tablet Take 1 tablet by mouth 2 (two) times daily. 60 tablet 3   exemestane  (AROMASIN ) 25 MG  tablet Take 1 tablet (25 mg total) by mouth daily after breakfast. 30 tablet 3   fluticasone  (FLONASE ) 50 MCG/ACT nasal spray Place 2 sprays into both nostrils daily. 16 g 5   Lifitegrast  (XIIDRA ) 5 % SOLN Instill 1 drop into both eyes twice a day 180 each 4   Lifitegrast  (XIIDRA ) 5 % SOLN Place 1 drop into both eyes 2 (two) times daily. 60 each 11   ondansetron  (ZOFRAN -ODT) 4 MG disintegrating tablet Take 1 tablet (4 mg total) by mouth every 8 (eight) hours as needed for Nausea migraine 20 tablet 11   pregabalin  (LYRICA ) 150 MG capsule Take 1 capsule (150 mg total) by mouth 2 (two) times daily. 60 capsule 1   Probiotic Product (PROBIOTIC PO) Take 1 capsule by mouth daily.     rizatriptan  (MAXALT -MLT) 10 MG disintegrating tablet Take 1 tablet (10 mg total) by mouth once as needed (may repeat dose once in >  2 hrs) for up to 1 dose 10 tablet 11   sulfaSALAzine  (AZULFIDINE ) 500 MG EC tablet Take 2 tablets (1,000 mg total) by mouth 2 (two) times daily. 120 tablet 2   topiramate  (TOPAMAX ) 25 MG tablet Take 1 tablet (25 mg total) by mouth 2 (two) times daily. 60 tablet 11   Vaginal Lubricant (HYALO GYN) GEL Place vaginally every other day.     No current facility-administered medications on file prior to visit.    There are no Patient Instructions on file for this visit. No follow-ups on file.   Gwendlyn JONELLE Shank, NP

## 2024-07-31 LAB — VAS US ABI WITH/WO TBI
Left ABI: 1.28
Right ABI: 1.29

## 2024-08-02 ENCOUNTER — Encounter: Payer: Self-pay | Admitting: Family Medicine

## 2024-08-04 ENCOUNTER — Encounter: Payer: Self-pay | Admitting: Oncology

## 2024-08-04 NOTE — Progress Notes (Signed)
 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  This was followed by a diagnostic mammogram and ultrasound which showed multiple benign cysts noted at the 1 o'clock position.  At the 2 o'clock position 3 cm from the nipple was a oval hypoechoic mass measuring 5 x 4 x 3 mm.  No suspicious left axillary adenopathy.  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   Final pathology showed grade two 7 mm tumor with negative margins.  1 sentinel lymph node negative for malignancy.  Oncotype results showed a recurrence score of 28 and absolute benefit of chemotherapy at greater than 15%.   Patient completed 4 cycles of adjuvant TC chemotherapy in February 2024.  She also completed adjuvant radiation and started taking Arimidex  in March 2024.  She subsequently switched to exemestane  with better tolerance she is also on adjuvant Zometa .      Interval history- patient is seeing Dr. Tobie for symptoms of  arthritis which is overall concerning for psoriatic arthritis.  She was previously taking sulfasalazine  but not using it presently.  Tolerating exemestane  well.  ECOG PS- 0 Pain scale- 0   Review of systems- Review of Systems  Constitutional:  Negative for chills, fever, malaise/fatigue and weight loss.  HENT:  Negative for congestion, ear discharge and nosebleeds.   Eyes:  Negative for blurred vision.  Respiratory:  Negative for cough, hemoptysis, sputum production, shortness of breath and wheezing.   Cardiovascular:  Negative for chest pain, palpitations, orthopnea and claudication.  Gastrointestinal:  Negative for abdominal pain, blood in stool, constipation, diarrhea, heartburn, melena, nausea and vomiting.  Genitourinary:  Negative for dysuria, flank pain, frequency, hematuria and urgency.  Musculoskeletal:  Negative for back pain, joint pain and myalgias.  Skin:  Negative for rash.  Neurological:  Negative for dizziness, tingling, focal weakness, seizures, weakness and headaches.  Endo/Heme/Allergies:  Does not bruise/bleed easily.  Psychiatric/Behavioral:  Negative for depression and suicidal ideas. The patient does not have insomnia.       Allergies  Allergen Reactions   Cymbalta  [Duloxetine  Hcl] Other (See Comments)    Hallucinations; sleep walking   Nortriptyline  Nausea And Vomiting   Phenergan  [Promethazine  Hcl]     Restless legs and cramping     Past Medical History:  Diagnosis Date   Anxiety    Bronchitis    Cancer (HCC) 09/2022   left breast IMC   Complication of anesthesia    PONV   Contusion of leg 10/06/2015   Depression    GERD (gastroesophageal reflux disease)    Insomnia    Migraine  headache    Pneumonia    2000   PONV (postoperative nausea and vomiting)      Past Surgical History:  Procedure Laterality Date   ABDOMINAL HYSTERECTOMY     age [age]   BALLOON DILATION N/A 12/14/2020   Procedure: BALLOON DILATION;  Surgeon: Cindie Carlin POUR,  DO;  [facility];  Service: Endoscopy;  Laterality: N/A;   BIOPSY  12/14/2020   Procedure: BIOPSY;  Surgeon: Cindie Carlin POUR, DO;  [facility]: [facility];  Service: Endoscopy;;   BREAST BIOPSY Left 08/24/2022   us  bx, coil marker +   BREAST LUMPECTOMY WITH RADIOACTIVE SEED AND SENTINEL LYMPH NODE BIOPSY Left 09/29/2022   Invasive ductal carcinoma, grade 2, pT1b, pN0  - Margins are negative for carcinoma in situ or invasive carcinoma   CESAREAN SECTION     CHOLECYSTECTOMY  1988   COLONOSCOPY N/A 12/10/2018    six 4-8 mm polyps in proximal descending colon, mid transverse and hepatic flexure. Diverticulosis. External and internal hemorrhoids. Torturous colon. Two simple adenomas, two serrated polyps, 2 hyperplastic polyps. Surveillance due Dec 2022.    COLONOSCOPY N/A 05/03/2024   Procedure: COLONOSCOPY;  Surgeon: Cindie Carlin POUR, DO;  [facility]: [facility];  Service: Endoscopy;  Laterality: N/A;  1:00PM, ASA 2   ESOPHAGOGASTRODUODENOSCOPY (EGD) WITH PROPOFOL  N/A 12/14/2020   Benign-appearing esophageal stenosis s/p dilation, gastritis, normal duodenum. Mild chronic gastritis.    open heart surgery  1967   PATENT DUCTUS ARTERIOUS REPAIR  1967   POLYPECTOMY  12/10/2018   Procedure: POLYPECTOMY;  Surgeon: Harvey Margo CROME, MD;  [facility]: [facility];  Service: Endoscopy;;  hepatic flexure, descending, transverse   PORT-A-CATH REMOVAL Right 03/14/2023   Procedure: REMOVAL PORT-A-CATH;  Surgeon: Ebbie Cough, MD;  Location: [facility];  Service: General;  Laterality: Right;   PORTACATH PLACEMENT N/A 11/14/2022   Procedure: PORT PLACEMENT WITH ULTRASOUND GUIDANCE;  Surgeon: Ebbie Cough, MD;  Location: [facility];  Service: General;  Laterality: N/A;   SHOULDER ARTHROSCOPY Left 06/16/2017   Procedure: LEFT SHOULDER ARTHROSCOPY, DEBRIDEMENT, AND DECOMPRESSION;  Surgeon: Harden Jerona GAILS, MD;  Location: [facility];  Service: Orthopedics;   Laterality: Left;   SHOULDER ARTHROSCOPY WITH SUBACROMIAL DECOMPRESSION Left 10/03/2019   Procedure: SHOULDER ARTHROSCOPY WITH DEBRIDEMENT, DECOMPRESSION;  Surgeon: Edie Norleen PARAS, MD;  Location: [facility];  Service: Orthopedics;  Laterality: Left;   SHOULDER SURGERY Left 2008   [facility] joint    Social History   Socioeconomic History   Marital status: Married    Spouse name: daniel   Number of children: 4   Years of education: Not on file   Highest education level: Associate degree: occupational, Scientist, product/process development, or vocational program  Occupational History   Not on file  Tobacco Use   Smoking status: Never   Smokeless tobacco: Never  Vaping Use   Vaping status: Never Used  Substance and Sexual Activity   Alcohol use: Yes    Comment: occassionally-not since chemo started   Drug use: No   Sexual activity: Not Currently    Birth control/protection: Surgical    Comment: hyst  Other Topics Concern   Not on file  Social History Narrative   Not on file   Social Drivers of Health   Financial Resource Strain: Low Risk  (05/13/2024)   Overall Financial Resource Strain (CARDIA)    Difficulty of Paying Living Expenses: Not hard at all  Food Insecurity: No Food Insecurity (05/13/2024)   Hunger Vital Sign  Worried About Programme researcher, broadcasting/film/video in the Last Year: Never true    Ran Out of Food in the Last Year: Never true  Transportation Needs: No Transportation Needs (05/13/2024)   PRAPARE - Administrator, Civil Service (Medical): No    Lack of Transportation (Non-Medical): No  Physical Activity: Insufficiently Active (05/13/2024)   Exercise Vital Sign    Days of Exercise per Week: 3 days    Minutes of Exercise per Session: 30 min  Stress: No Stress Concern Present (05/13/2024)   [employer] of Occupational Health - Occupational Stress Questionnaire    Feeling of Stress : Only a little  Recent Concern: Stress - Stress Concern Present (04/30/2024)   [employer] of Occupational  Health - Occupational Stress Questionnaire    Feeling of Stress : Very much  Social Connections: Socially Integrated (05/13/2024)   Social Connection and Isolation Panel    Frequency of Communication with Friends and Family: Twice a week    Frequency of Social Gatherings with Friends and Family: More than three times a week    Attends Religious Services: More than 4 times per year    Active Member of [employer] or Organizations: Yes    Attends Engineer, structural: More than 4 times per year    Marital Status: Married  Catering manager Violence: Not At Risk (05/13/2024)   Humiliation, Afraid, Rape, and Kick questionnaire    Fear of Current or Ex-Partner: No    Emotionally Abused: No    Physically Abused: No    Sexually Abused: No    Family History  Problem Relation Age of Onset   Hypertension Maternal Grandmother    Diabetes Maternal Grandmother    Heart disease Maternal Grandmother 40       MI   Hypertension Maternal Grandfather    Throat cancer Maternal Grandfather 37   Hypertension Father    Breast cancer Mother 40   Colon cancer Paternal Uncle        dx 61s   Mental illness Son    Migraines Daughter      Current Outpatient Medications:    acetaminophen  (TYLENOL ) 500 MG tablet, Take 500 mg by mouth every 6 (six) hours as needed for moderate pain., Disp: , Rfl:    ALPRAZolam  (XANAX ) 0.5 MG tablet, Take 1 tablet (0.5 mg total) by mouth at bedtime as needed., Disp: 30 tablet, Rfl: 5   Calcium -Magnesium-Vitamin D  (CALCIUM  1200+D3 PO), Take 1 Capful by mouth daily., Disp: , Rfl:    cetirizine -pseudoephedrine  (ALLERGY RELIEF/NASAL DECONGEST) 5-120 MG tablet, Take 1 tablet by mouth 2 (two) times daily., Disp: 60 tablet, Rfl: 3   exemestane  (AROMASIN ) 25 MG tablet, Take 1 tablet (25 mg total) by mouth daily after breakfast., Disp: 30 tablet, Rfl: 3   fluticasone  (FLONASE ) 50 MCG/ACT nasal spray, Place 2 sprays into both nostrils daily., Disp: 16 g, Rfl: 5   Lifitegrast  (XIIDRA ) 5  % SOLN, Instill 1 drop into both eyes twice a day, Disp: 180 each, Rfl: 4   Lifitegrast  (XIIDRA ) 5 % SOLN, Place 1 drop into both eyes 2 (two) times daily., Disp: 60 each, Rfl: 11   ondansetron  (ZOFRAN -ODT) 4 MG disintegrating tablet, Take 1 tablet (4 mg total) by mouth every 8 (eight) hours as needed for Nausea migraine, Disp: 20 tablet, Rfl: 11   pregabalin  (LYRICA ) 150 MG capsule, Take 1 capsule (150 mg total) by mouth 2 (two) times daily., Disp: 60 capsule, Rfl: 1   Probiotic Product (PROBIOTIC  PO), Take 1 capsule by mouth daily., Disp: , Rfl:    rizatriptan  (MAXALT -MLT) 10 MG disintegrating tablet, Take 1 tablet (10 mg total) by mouth once as needed (may repeat dose once in > 2 hrs) for up to 1 dose, Disp: 10 tablet, Rfl: 11   sulfaSALAzine  (AZULFIDINE ) 500 MG EC tablet, Take 2 tablets (1,000 mg total) by mouth 2 (two) times daily., Disp: 120 tablet, Rfl: 2   topiramate  (TOPAMAX ) 25 MG tablet, Take 1 tablet (25 mg total) by mouth 2 (two) times daily., Disp: 60 tablet, Rfl: 11   Vaginal Lubricant (HYALO GYN) GEL, Place vaginally every other day., Disp: , Rfl:   Physical exam:  Vitals:   07/29/24 1351  BP: 118/68  Pulse: 78  Resp: 18  Temp: (!) 97 F (36.1 C)  TempSrc: Tympanic  SpO2: 98%  Weight: 168 lb 11.2 oz (76.5 kg)  Height: 5' 2 (1.575 m)   Physical Exam Cardiovascular:     Rate and Rhythm: Normal rate and regular rhythm.     Heart sounds: Normal heart sounds.  Pulmonary:     Effort: Pulmonary effort is normal.     Breath sounds: Normal breath sounds.  Abdominal:     General: Bowel sounds are normal.     Palpations: Abdomen is soft.  Skin:    General: Skin is warm and dry.  Neurological:     Mental Status: She is alert and oriented to person, place, and time.    Breast exam was performed in seated and lying down position. Patient is status post left lumpectomy with a well-healed surgical scar. No evidence of any palpable masses. No evidence of axillary adenopathy. No  evidence of any palpable masses or lumps in the right breast. No evidence of right axillary adenopathy   I have personally reviewed labs listed below:    Latest Ref Rng & Units 07/29/2024    1:24 PM  CMP  Glucose 70 - 99 mg/dL 894   BUN 6 - 20 mg/dL 15   Creatinine 9.55 - 1.00 mg/dL 9.23   Sodium 864 - 854 mmol/L 135   Potassium 3.5 - 5.1 mmol/L 3.5   Chloride 98 - 111 mmol/L 104   CO2 22 - 32 mmol/L 23   Calcium  8.9 - 10.3 mg/dL 9.3   Total Protein 6.5 - 8.1 g/dL 6.9   Total Bilirubin 0.0 - 1.2 mg/dL 0.5   Alkaline Phos 38 - 126 U/L 49   AST 15 - 41 U/L 21   ALT 0 - 44 U/L 19       Latest Ref Rng & Units 07/29/2024    1:24 PM  CBC  WBC 4.0 - 10.5 K/uL 7.3   Hemoglobin 12.0 - 15.0 g/dL 86.8   Hematocrit 63.9 - 46.0 % 40.2   Platelets 150 - 400 K/uL 216    I have personally reviewed Radiology images listed below: No images are attached to the encounter.  VAS US  ABI WITH/WO TBI Result Date: 07/31/2024  LOWER EXTREMITY DOPPLER STUDY Patient Name:  CHENILLE TOOR  Date of Exam:   07/30/2024 Medical Rec #: [id number]         Accession #:    [id number] Date of Birth: [birth year]         Patient Gender: F Patient Age:   26 years Exam Location:  [facility] Procedure:      VAS US  ABI WITH/WO TBI Referring Phys: --------------------------------------------------------------------------------  Indications: Paresthesia and pain of right extremity  Performing Technologist: Elsie Churn RT, RDMS, RVT  Examination Guidelines: A complete evaluation includes at minimum, Doppler waveform signals and systolic blood pressure reading at the level of bilateral brachial, anterior tibial, and posterior tibial arteries, when vessel segments are accessible. Bilateral testing is considered an integral part of a complete examination. Photoelectric Plethysmograph (PPG) waveforms and toe systolic pressure readings are included as required and additional duplex testing as needed. Limited examinations  for reoccurring indications may be performed as noted.  ABI Findings: +---------+------------------+-----+-----------+--------+ Right    Rt Pressure (mmHg)IndexWaveform   Comment  +---------+------------------+-----+-----------+--------+ Brachial 117                                        +---------+------------------+-----+-----------+--------+ PTA      151               1.29 multiphasic         +---------+------------------+-----+-----------+--------+ DP       149               1.27 multiphasic         +---------+------------------+-----+-----------+--------+ Great Toe119               1.02 Normal              +---------+------------------+-----+-----------+--------+ +---------+------------------+-----+-----------+-------------------------------+ Left     Lt Pressure (mmHg)IndexWaveform   Comment                         +---------+------------------+-----+-----------+-------------------------------+ Brachial                                   not performed d/t breast cancer +---------+------------------+-----+-----------+-------------------------------+ PTA      147               1.26 multiphasic                                +---------+------------------+-----+-----------+-------------------------------+ DP       150               1.28 multiphasic                                +---------+------------------+-----+-----------+-------------------------------+ Great Toe121               1.03 Normal                                     +---------+------------------+-----+-----------+-------------------------------+ +-------+-----------+-----------+------------+------------+ ABI/TBIToday's ABIToday's TBIPrevious ABIPrevious TBI +-------+-----------+-----------+------------+------------+ Right  1.29       1.02       1.34        0.97         +-------+-----------+-----------+------------+------------+ Left   1.28       1.03       1.37        1.04          +-------+-----------+-----------+------------+------------+  Summary: The bilateral ankle-brachial indices, Doppler waveforms and TBIs indicate normal arterial perfusion to the bilateral lower extremities. These findings are also consistent with the exam from 11/24/21.  *See table(s) above for measurements and observations.  Electronically signed by Selinda  Dew MD on 07/31/2024 at 1:33:19 PM.    Final    VAS US  LOWER EXTREMITY VENOUS REFLUX Result Date: 07/31/2024  Lower Venous Reflux Study Patient Name:  PHIL CORTI  Date of Exam:   07/30/2024 Medical Rec #: [id number]         Accession #:    [id number] Date of Birth: [birth year]         Patient Gender: F Patient Age:   85 years Exam Location:  [facility] Procedure:      VAS US  LOWER EXTREMITY VENOUS REFLUX Referring Phys: ORVIN DARING --------------------------------------------------------------------------------  Other Indications: Paresthesia and pain of right extremity. Performing Technologist: Elsie Churn RT, RDMS, RVT  Examination Guidelines: A complete evaluation includes B-mode imaging, spectral Doppler, color Doppler, and power Doppler as needed of all accessible portions of each vessel. Bilateral testing is considered an integral part of a complete examination. Limited examinations for reoccurring indications may be performed as noted. The reflux portion of the exam is performed with the patient in reverse Trendelenburg. Significant venous reflux is defined as >500 ms in the superficial venous system, and >1 second in the deep venous system.  Venous Reflux Times +--------------+---------+------+-----------+------------+--------+ RIGHT         Reflux NoRefluxReflux TimeDiameter cmsComments                         Yes                                  +--------------+---------+------+-----------+------------+--------+ CFV           no                                              +--------------+---------+------+-----------+------------+--------+ FV            no                                             +--------------+---------+------+-----------+------------+--------+ Popliteal     no                                             +--------------+---------+------+-----------+------------+--------+ GSV at SFJ    no                                             +--------------+---------+------+-----------+------------+--------+ GSV prox thighno                                             +--------------+---------+------+-----------+------------+--------+ GSV at knee   no                                             +--------------+---------+------+-----------+------------+--------+ SSV prox calf no                                             +--------------+---------+------+-----------+------------+--------+  Summary: Right: - No evidence of deep or superficial vein thrombosis seen in the right lower extremity. - There is no evidence of venous reflux seen in the right lower extremity.  *See table(s) above for measurements and observations. Electronically signed by Selinda Gu MD on 07/31/2024 at 1:32:41 PM.    Final    MM 3D DIAGNOSTIC MAMMOGRAM BILATERAL BREAST Result Date: 07/15/2024 CLINICAL DATA:  Status post LEFT lumpectomy for invasive mammary carcinoma pain October 2023. Negative margins negative nodes. Status post radiation. Patient reports increased firmness in the upper outer LEFT breast. EXAM: DIGITAL DIAGNOSTIC BILATERAL MAMMOGRAM WITH TOMOSYNTHESIS AND CAD; ULTRASOUND LEFT BREAST LIMITED TECHNIQUE: Bilateral digital diagnostic mammography and breast tomosynthesis was performed. The images were evaluated with computer-aided detection. ; Targeted ultrasound examination of the left breast was performed. COMPARISON:  Previous exam(s). ACR Breast Density Category c: The breasts are heterogeneously dense, which may obscure small masses. FINDINGS:  There is density and architectural distortion within the LEFT breast, consistent with postsurgical changes. These are stable in comparison to prior. No suspicious mass, distortion, or microcalcifications are identified to suggest presence of malignancy. On physical exam, well-healed postsurgical changes are noted. Targeted ultrasound was performed of the site of palpable concern in the LEFT breast. No suspicious cystic or solid mass is seen. Benign postsurgical changes are noted. IMPRESSION: 1. No mammographic or sonographic evidence of malignancy at the site of palpable concern in the LEFT breast. Any further workup of the patient's symptoms should be based on the clinical assessment. Should further imaging be warranted, this would best assessed with dedicated MRI with and without contrast. 2. No mammographic evidence of malignancy bilaterally. RECOMMENDATION: Recommend bilateral diagnostic mammogram (with RIGHT and LEFT breast ultrasound if deemed necessary) in 1 year. This will establish over 2 year stability status post lumpectomy. I have discussed the findings and recommendations with the patient. If applicable, a reminder letter will be sent to the patient regarding the next appointment. BI-RADS CATEGORY  2: Benign. Electronically Signed   By: Corean Salter M.D.   On: 07/15/2024 11:58   US  LIMITED ULTRASOUND INCLUDING AXILLA LEFT BREAST  Result Date: 07/15/2024 CLINICAL DATA:  Status post LEFT lumpectomy for invasive mammary carcinoma pain October 2023. Negative margins negative nodes. Status post radiation. Patient reports increased firmness in the upper outer LEFT breast. EXAM: DIGITAL DIAGNOSTIC BILATERAL MAMMOGRAM WITH TOMOSYNTHESIS AND CAD; ULTRASOUND LEFT BREAST LIMITED TECHNIQUE: Bilateral digital diagnostic mammography and breast tomosynthesis was performed. The images were evaluated with computer-aided detection. ; Targeted ultrasound examination of the left breast was performed. COMPARISON:   Previous exam(s). ACR Breast Density Category c: The breasts are heterogeneously dense, which may obscure small masses. FINDINGS: There is density and architectural distortion within the LEFT breast, consistent with postsurgical changes. These are stable in comparison to prior. No suspicious mass, distortion, or microcalcifications are identified to suggest presence of malignancy. On physical exam, well-healed postsurgical changes are noted. Targeted ultrasound was performed of the site of palpable concern in the LEFT breast. No suspicious cystic or solid mass is seen. Benign postsurgical changes are noted. IMPRESSION: 1. No mammographic or sonographic evidence of malignancy at the site of palpable concern in the LEFT breast. Any further workup of the patient's symptoms should be based on the clinical assessment. Should further imaging be warranted, this would best assessed with dedicated MRI with and without contrast. 2. No mammographic evidence of malignancy bilaterally. RECOMMENDATION: Recommend bilateral diagnostic mammogram (with RIGHT and LEFT breast ultrasound if deemed necessary) in 1  year. This will establish over 2 year stability status post lumpectomy. I have discussed the findings and recommendations with the patient. If applicable, a reminder letter will be sent to the patient regarding the next appointment. BI-RADS CATEGORY  2: Benign. Electronically Signed   By: Corean Salter M.D.   On: 07/15/2024 11:58     Assessment and plan- Patient is a 58 y.o. female with history of stage I invasive mammary carcinoma of the left breast T1b N0 M0 ER/PR positive HER2 negative status postlumpectomy and sentinel lymph node biopsy followed by adjuvant chemotherapy and radiation.  She is here for routine follow-up visit    Clinically patient is doing well with no concerning signs and symptoms of recurrence based on today's exam.  Calcium  and kidney levels acceptable to proceed with Zometa  today.  This will  be her last dose.  I will see her back in 6 months no labs.  She will be due for a repeat bone density scan in November 2025 which I will schedule.  Her mammogram from August 2025 was unremarkable. Visit Diagnosis 1. Encounter for follow-up surveillance of breast cancer   2. Encounter for monitoring zoledronic  acid therapy   3. High risk medication use   4. Use of exemestane  (Aromasin )      Dr. Annah Skene, MD, MPH [facility] [phone] 08/04/2024 8:35 PM

## 2024-08-05 ENCOUNTER — Other Ambulatory Visit: Payer: Self-pay

## 2024-08-05 ENCOUNTER — Other Ambulatory Visit: Payer: Self-pay | Admitting: Oncology

## 2024-08-05 ENCOUNTER — Telehealth: Payer: Self-pay | Admitting: *Deleted

## 2024-08-05 ENCOUNTER — Other Ambulatory Visit: Payer: Self-pay | Admitting: Family Medicine

## 2024-08-05 DIAGNOSIS — Z170 Estrogen receptor positive status [ER+]: Secondary | ICD-10-CM

## 2024-08-05 DIAGNOSIS — R202 Paresthesia of skin: Secondary | ICD-10-CM

## 2024-08-05 MED ORDER — PREGABALIN 75 MG PO CAPS
75.0000 mg | ORAL_CAPSULE | Freq: Two times a day (BID) | ORAL | 1 refills | Status: AC
  Filled 2024-08-05: qty 180, 90d supply, fill #0

## 2024-08-05 MED ORDER — EXEMESTANE 25 MG PO TABS
25.0000 mg | ORAL_TABLET | Freq: Every day | ORAL | 3 refills | Status: DC
  Filled 2024-08-05: qty 30, 30d supply, fill #0
  Filled 2024-08-29: qty 30, 30d supply, fill #1
  Filled 2024-10-03: qty 30, 30d supply, fill #2
  Filled 2024-11-05: qty 30, 30d supply, fill #3

## 2024-08-05 NOTE — Telephone Encounter (Signed)
 I called the patient and let her know that Dr. Melanee had just sent it in a few minutes ago.  It went through to [facility] pharmacy.  She will go get rx  today

## 2024-08-06 ENCOUNTER — Other Ambulatory Visit: Payer: Self-pay

## 2024-08-16 ENCOUNTER — Other Ambulatory Visit: Payer: Self-pay

## 2024-08-19 ENCOUNTER — Ambulatory Visit: Admitting: Family Medicine

## 2024-08-26 ENCOUNTER — Other Ambulatory Visit: Payer: Self-pay

## 2024-08-26 DIAGNOSIS — Z79899 Other long term (current) drug therapy: Secondary | ICD-10-CM | POA: Diagnosis not present

## 2024-08-26 DIAGNOSIS — L4050 Arthropathic psoriasis, unspecified: Secondary | ICD-10-CM | POA: Diagnosis not present

## 2024-08-26 MED ORDER — FOLIC ACID 1 MG PO TABS
1.0000 mg | ORAL_TABLET | Freq: Every day | ORAL | 3 refills | Status: AC
  Filled 2024-08-26: qty 90, 90d supply, fill #0
  Filled 2024-11-27 – 2024-12-16 (×2): qty 90, 90d supply, fill #1
  Filled 2024-12-18: qty 90, 90d supply, fill #0

## 2024-08-26 MED ORDER — METHOTREXATE SODIUM 2.5 MG PO TABS
10.0000 mg | ORAL_TABLET | ORAL | 2 refills | Status: DC
  Filled 2024-08-26: qty 48, 84d supply, fill #0

## 2024-08-29 ENCOUNTER — Other Ambulatory Visit: Payer: Self-pay

## 2024-08-29 DIAGNOSIS — M65312 Trigger thumb, left thumb: Secondary | ICD-10-CM | POA: Diagnosis not present

## 2024-09-02 DIAGNOSIS — M1711 Unilateral primary osteoarthritis, right knee: Secondary | ICD-10-CM | POA: Diagnosis not present

## 2024-09-02 DIAGNOSIS — M25561 Pain in right knee: Secondary | ICD-10-CM | POA: Diagnosis not present

## 2024-09-02 DIAGNOSIS — G5731 Lesion of lateral popliteal nerve, right lower limb: Secondary | ICD-10-CM | POA: Diagnosis not present

## 2024-09-02 DIAGNOSIS — M79604 Pain in right leg: Secondary | ICD-10-CM | POA: Diagnosis not present

## 2024-09-02 DIAGNOSIS — G8929 Other chronic pain: Secondary | ICD-10-CM | POA: Diagnosis not present

## 2024-09-11 ENCOUNTER — Other Ambulatory Visit: Payer: Self-pay

## 2024-09-16 ENCOUNTER — Ambulatory Visit

## 2024-09-16 ENCOUNTER — Inpatient Hospital Stay: Attending: Oncology | Admitting: *Deleted

## 2024-09-16 DIAGNOSIS — C50412 Malignant neoplasm of upper-outer quadrant of left female breast: Secondary | ICD-10-CM

## 2024-09-16 DIAGNOSIS — Z9189 Other specified personal risk factors, not elsewhere classified: Secondary | ICD-10-CM

## 2024-09-16 NOTE — Progress Notes (Signed)
 SUBJECTIVE: Pt returns for her 3 month L-Dex screen.    PAIN:  Are you having pain? No   SOZO SCREENING: Patient was assessed today using the SOZO machine to determine the lymphedema index score. This was compared to her baseline score. It was determined that she is within the recommended range when compared to her baseline and no further action is needed at this time. She will continue SOZO screenings. These are done every 3 months for 2 years post operatively followed by every 6 months for 2 years, and then annually.     L-DEX FLOWSHEETS                L-DEX LYMPHEDEMA SCREENING    Measurement Type Unilateral     L-DEX MEASUREMENT EXTREMITY Upper Extremity     POSITION  Standing     DOMINANT SIDE Right     At Risk Side Left     BASELINE SCORE (UNILATERAL) -3.9    L-DEX SCORE (UNILATERAL) -8.1    VALUE CHANGE (UNILAT) -4.2

## 2024-09-20 ENCOUNTER — Other Ambulatory Visit: Payer: Self-pay

## 2024-09-20 ENCOUNTER — Encounter: Payer: Self-pay | Admitting: Oncology

## 2024-09-23 ENCOUNTER — Encounter: Payer: Self-pay | Admitting: Oncology

## 2024-09-26 ENCOUNTER — Other Ambulatory Visit: Payer: Self-pay

## 2024-09-26 DIAGNOSIS — M542 Cervicalgia: Secondary | ICD-10-CM | POA: Diagnosis not present

## 2024-09-26 DIAGNOSIS — G43709 Chronic migraine without aura, not intractable, without status migrainosus: Secondary | ICD-10-CM | POA: Diagnosis not present

## 2024-09-26 MED ORDER — SUMATRIPTAN SUCCINATE 100 MG PO TABS
100.0000 mg | ORAL_TABLET | ORAL | 11 refills | Status: AC
  Filled 2024-09-26: qty 9, 30d supply, fill #0
  Filled 2024-11-05: qty 9, 30d supply, fill #1
  Filled 2024-12-16: qty 9, 30d supply, fill #2
  Filled 2024-12-18: qty 9, 30d supply, fill #0

## 2024-09-26 MED ORDER — TOPIRAMATE 25 MG PO TABS
75.0000 mg | ORAL_TABLET | Freq: Two times a day (BID) | ORAL | 11 refills | Status: AC
  Filled 2024-09-26: qty 180, 30d supply, fill #0
  Filled 2024-11-05 – 2024-12-16 (×2): qty 180, 30d supply, fill #1
  Filled 2024-12-18: qty 180, 30d supply, fill #0

## 2024-09-27 ENCOUNTER — Ambulatory Visit: Attending: Cardiology | Admitting: Cardiology

## 2024-09-27 ENCOUNTER — Encounter: Payer: Self-pay | Admitting: Cardiology

## 2024-09-27 VITALS — BP 112/62 | HR 71 | Ht 62.0 in | Wt 168.2 lb

## 2024-09-27 DIAGNOSIS — Z8774 Personal history of (corrected) congenital malformations of heart and circulatory system: Secondary | ICD-10-CM

## 2024-09-27 NOTE — Progress Notes (Signed)
 Cardiology Office Note:    Date:  09/27/2024   ID:  Nichole Brown, DOB 07-30-1966, MRN [id number]  PCP:  Cook, Jayce G, DO   [facility] Providers Cardiologist:  None     Referring MD: Cook, Jayce G, DO   Chief Complaint  Patient presents with   Establish Care    Re est pt has been doing well with no complaints of chest pain, chest pressure or SOB, medciation reviewed verbally with patient    History of Present Illness:    Nichole Brown is a 58 y.o. female with a hx of anxiety, migraine, GERD, PFO s/p repair at age 40, breast cancer s/p lumpectomy and sentinel lymph node biopsy + chemo and RT presents for follow-up.  Patient has a history of PFO which was repaired around age 40.  Denies chest pain or shortness of breath.  Has no cardiac issues or complaint.  Recent echo earlier this year she showed normal systolic function, no interatrial septal shunting.  Prior notes/testing Echo 01/2024 EF 60 to 65%, no atrial level shunt detected Echocardiogram 12/2018 EF 60 to 65%.  Exercise tolerance testing 2018 was normal.  Past Medical History:  Diagnosis Date   Anxiety    Bronchitis    Cancer (HCC) 09/2022   left breast IMC   Complication of anesthesia    PONV   Contusion of leg 10/06/2015   Depression    GERD (gastroesophageal reflux disease)    Insomnia    Migraine headache    Pneumonia    2000   PONV (postoperative nausea and vomiting)     Past Surgical History:  Procedure Laterality Date   ABDOMINAL HYSTERECTOMY     age [age]   BALLOON DILATION N/A 12/14/2020   Procedure: BALLOON DILATION;  Surgeon: Cindie Carlin POUR, DO;  [facility];  Service: Endoscopy;  Laterality: N/A;   BIOPSY  12/14/2020   Procedure: BIOPSY;  Surgeon: Cindie Carlin POUR, DO;  [facility];  Service: Endoscopy;;   BREAST BIOPSY Left 08/24/2022   us  bx, coil marker +   BREAST LUMPECTOMY WITH RADIOACTIVE SEED AND SENTINEL LYMPH NODE BIOPSY Left 09/29/2022   Invasive  ductal carcinoma, grade 2, pT1b, pN0  - Margins are negative for carcinoma in situ or invasive carcinoma   CESAREAN SECTION     CHOLECYSTECTOMY  1988   COLONOSCOPY N/A 12/10/2018    six 4-8 mm polyps in proximal descending colon, mid transverse and hepatic flexure. Diverticulosis. External and internal hemorrhoids. Torturous colon. Two simple adenomas, two serrated polyps, 2 hyperplastic polyps. Surveillance due Dec 2022.    COLONOSCOPY N/A 05/03/2024   Procedure: COLONOSCOPY;  Surgeon: Cindie Carlin POUR, DO;  [facility]: [facility];  Service: Endoscopy;  Laterality: N/A;  1:00PM, ASA 2   ESOPHAGOGASTRODUODENOSCOPY (EGD) WITH PROPOFOL  N/A 12/14/2020   Benign-appearing esophageal stenosis s/p dilation, gastritis, normal duodenum. Mild chronic gastritis.    open heart surgery  1967   PATENT DUCTUS ARTERIOUS REPAIR  1967   POLYPECTOMY  12/10/2018   Procedure: POLYPECTOMY;  Surgeon: Harvey Margo CROME, MD;  [facility]: [facility];  Service: Endoscopy;;  hepatic flexure, descending, transverse   PORT-A-CATH REMOVAL Right 03/14/2023   Procedure: REMOVAL PORT-A-CATH;  Surgeon: Ebbie Cough, MD;  Location: [facility];  Service: General;  Laterality: Right;   PORTACATH PLACEMENT N/A 11/14/2022   Procedure: PORT PLACEMENT WITH ULTRASOUND GUIDANCE;  Surgeon: Ebbie Cough, MD;  Location: [facility];  Service: General;  Laterality:  N/A;   SHOULDER ARTHROSCOPY Left 06/16/2017   Procedure: LEFT SHOULDER ARTHROSCOPY, DEBRIDEMENT, AND DECOMPRESSION;  Surgeon: Harden Jerona GAILS, MD;  Location: [facility];  Service: Orthopedics;  Laterality: Left;   SHOULDER ARTHROSCOPY WITH SUBACROMIAL DECOMPRESSION Left 10/03/2019   Procedure: SHOULDER ARTHROSCOPY WITH DEBRIDEMENT, DECOMPRESSION;  Surgeon: Edie Norleen PARAS, MD;  Location: [facility];  Service: Orthopedics;  Laterality: Left;   SHOULDER SURGERY Left 2008   AC joint    Current Medications: Current Meds  Medication Sig    acetaminophen  (TYLENOL ) 500 MG tablet Take 500 mg by mouth every 6 (six) hours as needed for moderate pain.   ALPRAZolam  (XANAX ) 0.5 MG tablet Take 1 tablet (0.5 mg total) by mouth at bedtime as needed.   Calcium -Magnesium-Vitamin D  (CALCIUM  1200+D3 PO) Take 1 Capful by mouth daily.   cetirizine -pseudoephedrine  (ALLERGY RELIEF/NASAL DECONGEST) 5-120 MG tablet Take 1 tablet by mouth 2 (two) times daily.   exemestane  (AROMASIN ) 25 MG tablet Take 1 tablet (25 mg total) by mouth daily after breakfast.   fluticasone  (FLONASE ) 50 MCG/ACT nasal spray Place 2 sprays into both nostrils daily.   folic acid  (FOLVITE ) 1 MG tablet Take 1 tablet (1 mg total) by mouth daily.   Lifitegrast  (XIIDRA ) 5 % SOLN Instill 1 drop into both eyes twice a day   Lifitegrast  (XIIDRA ) 5 % SOLN Place 1 drop into both eyes 2 (two) times daily.   methotrexate  (RHEUMATREX) 2.5 MG tablet Take 4 tablets (10 mg total) by mouth every 7 (seven) days for 90 days   ondansetron  (ZOFRAN -ODT) 4 MG disintegrating tablet Take 1 tablet (4 mg total) by mouth every 8 (eight) hours as needed for Nausea migraine   pregabalin  (LYRICA ) 75 MG capsule Take 1 capsule (75 mg total) by mouth 2 (two) times daily.   Probiotic Product (PROBIOTIC PO) Take 1 capsule by mouth daily.   sulfaSALAzine  (AZULFIDINE ) 500 MG EC tablet Take 2 tablets (1,000 mg total) by mouth 2 (two) times daily.   SUMAtriptan  (IMITREX ) 100 MG tablet Take 1 tablet (100 mg total) by mouth as directed for Migraine May take a second dose after 2 hours if needed.   topiramate  (TOPAMAX ) 25 MG tablet Take 1 tablet (25 mg total) by mouth 2 (two) times daily.   topiramate  (TOPAMAX ) 25 MG tablet Take 3 tablets (75 mg total) by mouth 2 (two) times daily.   Vaginal Lubricant (HYALO GYN) GEL Place vaginally every other day.     Allergies:   Cymbalta  [duloxetine  hcl], Nortriptyline , and Phenergan  [promethazine  hcl]   Social History   Socioeconomic History   Marital status: Married    Spouse  name: daniel   Number of children: 4   Years of education: Not on file   Highest education level: Associate degree: occupational, Scientist, product/process development, or vocational program  Occupational History   Not on file  Tobacco Use   Smoking status: Never   Smokeless tobacco: Never  Vaping Use   Vaping status: Never Used  Substance and Sexual Activity   Alcohol use: Yes    Comment: occassionally-not since chemo started   Drug use: No   Sexual activity: Not Currently    Birth control/protection: Surgical    Comment: hyst  Other Topics Concern   Not on file  Social History Narrative   Not on file   Social Drivers of Health   Financial Resource Strain: Low Risk  (05/13/2024)   Overall Financial Resource Strain (CARDIA)    Difficulty of Paying Living Expenses: Not hard at all  Food Insecurity: No Food Insecurity (05/13/2024)   Hunger Vital Sign    Worried About Running Out of Food in the Last Year: Never true    Ran Out of Food in the Last Year: Never true  Transportation Needs: No Transportation Needs (05/13/2024)   PRAPARE - Administrator, Civil Service (Medical): No    Lack of Transportation (Non-Medical): No  Physical Activity: Insufficiently Active (05/13/2024)   Exercise Vital Sign    Days of Exercise per Week: 3 days    Minutes of Exercise per Session: 30 min  Stress: No Stress Concern Present (05/13/2024)   [employer] of Occupational Health - Occupational Stress Questionnaire    Feeling of Stress : Only a little  Recent Concern: Stress - Stress Concern Present (04/30/2024)   [employer] of Occupational Health - Occupational Stress Questionnaire    Feeling of Stress : Very much  Social Connections: Socially Integrated (05/13/2024)   Social Connection and Isolation Panel    Frequency of Communication with Friends and Family: Twice a week    Frequency of Social Gatherings with Friends and Family: More than three times a week    Attends Religious Services: More than 4 times  per year    Active Member of [employer] or Organizations: Yes    Attends Engineer, structural: More than 4 times per year    Marital Status: Married     Family History: The patient's family history includes Breast cancer (age of onset: 19) in her mother; Colon cancer in her paternal uncle; Diabetes in her maternal grandmother; Heart disease (age of onset: 64) in her maternal grandmother; Hypertension in her father, maternal grandfather, and maternal grandmother; Mental illness in her son; Migraines in her daughter; Throat cancer (age of onset: 83) in her maternal grandfather.  ROS:   Please see the history of present illness.     All other systems reviewed and are negative.  EKGs/Labs/Other Studies Reviewed:    The following studies were reviewed today:  EKG Interpretation Date/Time:  Friday September 27 2024 15:34:20 EDT Ventricular Rate:  71 PR Interval:  140 QRS Duration:  80 QT Interval:  390 QTC Calculation: 423 R Axis:   -15  Text Interpretation: Normal sinus rhythm Normal ECG Confirmed by Darliss Rogue ([id number]) on 09/27/2024 3:49:12 PM    Recent Labs: 06/03/2024: TSH 3.020 07/29/2024: ALT 19; BUN 15; Creatinine 0.76; Hemoglobin 13.1; Platelet Count 216; Potassium 3.5; Sodium 135  Recent Lipid Panel    Component Value Date/Time   CHOL 179 06/03/2024 0946   CHOL 176 09/22/2014 0615   TRIG 125 06/03/2024 0946   TRIG 242 (H) 09/22/2014 0615   HDL 40 06/03/2024 0946   HDL 46 09/22/2014 0615   CHOLHDL 4.5 (H) 06/03/2024 0946   CHOLHDL 3.8 05/22/2023 1525   VLDL 56 (H) 05/22/2023 1525   VLDL 48 (H) 09/22/2014 0615   LDLCALC 116 (H) 06/03/2024 0946   LDLCALC 82 09/22/2014 0615     Risk Assessment/Calculations:          Physical Exam:    VS:  BP 112/62 (BP Location: Left Arm, Patient Position: Sitting, Cuff Size: Normal)   Pulse 71   Ht 5' 2 (1.575 m)   Wt 168 lb 3.2 oz (76.3 kg)   SpO2 99%   BMI 30.76 kg/m     Wt Readings from Last 3 Encounters:   09/27/24 168 lb 3.2 oz (76.3 kg)  07/30/24 167 lb 12.8 oz (76.1 kg)  07/29/24  168 lb 11.2 oz (76.5 kg)     GEN:  Well nourished, well developed in no acute distress HEENT: Normal NECK: No JVD; No carotid bruits CARDIAC: RRR, no murmurs, rubs, gallops RESPIRATORY:  Clear to auscultation without rales, wheezing or rhonchi  ABDOMEN: Soft, non-tender, non-distended MUSCULOSKELETAL:  No edema; No deformity  SKIN: Warm and dry NEUROLOGIC:  Alert and oriented x 3 PSYCHIATRIC:  Normal affect   ASSESSMENT:    1. History of surgical closure of patent foramen ovale (PFO)    PLAN:    In order of problems listed above:  History of PFO s/p repair at age 4.  Recent echo 01/2024 EF 60 to 65%, no evidence of interatrial shunting.  Patient feels well, denies chest pain or shortness of breath.  Denies any cardiac issues or concerns.  Follow-up as needed    Medication Adjustments/Labs and Tests Ordered: Current medicines are reviewed at length with the patient today.  Concerns regarding medicines are outlined above.  Orders Placed This Encounter  Procedures   EKG 12-Lead    No orders of the defined types were placed in this encounter.    There are no Patient Instructions on file for this visit.   Signed, Redell Cave, MD  09/27/2024 4:13 PM    [facility]

## 2024-09-30 ENCOUNTER — Other Ambulatory Visit: Payer: Self-pay

## 2024-09-30 DIAGNOSIS — F411 Generalized anxiety disorder: Secondary | ICD-10-CM | POA: Diagnosis not present

## 2024-09-30 MED ORDER — PREDNISONE 5 MG PO TABS
ORAL_TABLET | ORAL | 0 refills | Status: DC
  Filled 2024-09-30: qty 30, 12d supply, fill #0

## 2024-10-03 ENCOUNTER — Encounter: Payer: Self-pay | Admitting: Oncology

## 2024-10-03 ENCOUNTER — Other Ambulatory Visit: Payer: Self-pay

## 2024-10-21 ENCOUNTER — Ambulatory Visit
Admission: RE | Admit: 2024-10-21 | Discharge: 2024-10-21 | Disposition: A | Discharge status: Home / Self Care | Source: Ambulatory Visit | Attending: Oncology | Admitting: Oncology

## 2024-10-21 DIAGNOSIS — Z780 Asymptomatic menopausal state: Secondary | ICD-10-CM | POA: Diagnosis not present

## 2024-10-21 DIAGNOSIS — Z170 Estrogen receptor positive status [ER+]: Secondary | ICD-10-CM | POA: Diagnosis not present

## 2024-10-21 DIAGNOSIS — M8589 Other specified disorders of bone density and structure, multiple sites: Secondary | ICD-10-CM | POA: Diagnosis not present

## 2024-10-21 DIAGNOSIS — C50412 Malignant neoplasm of upper-outer quadrant of left female breast: Secondary | ICD-10-CM | POA: Insufficient documentation

## 2024-10-22 ENCOUNTER — Other Ambulatory Visit: Payer: Self-pay

## 2024-10-22 MED ORDER — TIZANIDINE HCL 2 MG PO TABS
1.0000 mg | ORAL_TABLET | Freq: Three times a day (TID) | ORAL | 0 refills | Status: AC | PRN
  Filled 2024-10-22: qty 180, 30d supply, fill #0

## 2024-10-23 DIAGNOSIS — M79604 Pain in right leg: Secondary | ICD-10-CM | POA: Diagnosis not present

## 2024-10-23 DIAGNOSIS — M25561 Pain in right knee: Secondary | ICD-10-CM | POA: Diagnosis not present

## 2024-10-23 DIAGNOSIS — M1711 Unilateral primary osteoarthritis, right knee: Secondary | ICD-10-CM | POA: Diagnosis not present

## 2024-10-23 DIAGNOSIS — G5731 Lesion of lateral popliteal nerve, right lower limb: Secondary | ICD-10-CM | POA: Diagnosis not present

## 2024-10-23 DIAGNOSIS — G8929 Other chronic pain: Secondary | ICD-10-CM | POA: Diagnosis not present

## 2024-10-24 ENCOUNTER — Other Ambulatory Visit: Payer: Self-pay

## 2024-10-25 ENCOUNTER — Other Ambulatory Visit: Payer: Self-pay

## 2024-10-25 DIAGNOSIS — L4050 Arthropathic psoriasis, unspecified: Secondary | ICD-10-CM | POA: Diagnosis not present

## 2024-10-25 DIAGNOSIS — R252 Cramp and spasm: Secondary | ICD-10-CM | POA: Diagnosis not present

## 2024-10-25 DIAGNOSIS — Z23 Encounter for immunization: Secondary | ICD-10-CM | POA: Diagnosis not present

## 2024-10-25 MED ORDER — METHOTREXATE SODIUM 2.5 MG PO TABS
10.0000 mg | ORAL_TABLET | ORAL | 2 refills | Status: AC
  Filled 2024-10-25: qty 16, 28d supply, fill #0
  Filled 2024-11-05: qty 48, 84d supply, fill #0

## 2024-10-25 MED ORDER — SULFASALAZINE 500 MG PO TBEC
1000.0000 mg | DELAYED_RELEASE_TABLET | Freq: Two times a day (BID) | ORAL | 2 refills | Status: AC
  Filled 2024-11-27 – 2024-12-18 (×3): qty 120, 30d supply, fill #0

## 2024-10-28 DIAGNOSIS — F419 Anxiety disorder, unspecified: Secondary | ICD-10-CM | POA: Diagnosis not present

## 2024-11-04 DIAGNOSIS — G5731 Lesion of lateral popliteal nerve, right lower limb: Secondary | ICD-10-CM | POA: Diagnosis not present

## 2024-11-05 ENCOUNTER — Other Ambulatory Visit: Payer: Self-pay

## 2024-11-15 ENCOUNTER — Other Ambulatory Visit: Payer: Self-pay

## 2024-11-25 ENCOUNTER — Ambulatory Visit: Payer: Self-pay

## 2024-11-25 ENCOUNTER — Encounter: Payer: Self-pay | Admitting: Nurse Practitioner

## 2024-11-25 DIAGNOSIS — F419 Anxiety disorder, unspecified: Secondary | ICD-10-CM | POA: Diagnosis not present

## 2024-11-25 NOTE — Telephone Encounter (Signed)
 Patient disconnected during transfer to triage, Attempted to contact patient x 1 to discuss symptoms; patients phone keeps losing signal;  Will attempt to contact patient at a later time to further discuss concerns.            Copied from CRM #[id number]. Topic: Clinical - Red Word Triage >> Nov 25, 2024  9:11 AM Willma R wrote: [facility] that prompted transfer to Nurse Triage: Patient thinks she may have a UTI, has increased frequency, nauseous, a slight odor, and a weird feeling as if she is leaking but isn't. Symptoms just started over the weekend. Answer Assessment - Initial Assessment Questions 1. SYMPTOM: What's the main symptom you're concerned about? (e.g., frequency, incontinence)       2. ONSET: When did the  symptoms  start?       3. PAIN: Is there any pain? If Yes, ask: How bad is it? (Scale: 1-10; mild, moderate, severe)       4. CAUSE: What do you think is causing the symptoms?       5. OTHER SYMPTOMS: Do you have any other symptoms? (e.g., blood in urine, fever, flank pain, pain with urination)  Protocols used: Urinary Symptoms-A-AH

## 2024-11-25 NOTE — Telephone Encounter (Signed)
 Pt reports dysuria that is worse today. Reports frequent urge with small amounts of urine at a time. States may have had a low grade fever on Friday, today reports some nausea. States has lower back pain but is historic for her.   Scheduled next day OV. To seek medical care sooner if she develops worsening flank pain (above her baseline), nausea, vomiting, fever.  Past Medical History:  Diagnosis Date   Anxiety    Bronchitis    Cancer (HCC) 09/2022   left breast IMC   Complication of anesthesia    PONV   Contusion of leg 10/06/2015   Depression    GERD (gastroesophageal reflux disease)    Insomnia    Migraine headache    Pneumonia    2000   PONV (postoperative nausea and vomiting)    Reason for Disposition  Urinating more frequently than usual (i.e., frequency) OR new-onset of the feeling of an urgent need to urinate (i.e., urgency)  Protocols used: Urinary Symptoms-A-AH

## 2024-11-26 ENCOUNTER — Ambulatory Visit

## 2024-11-26 VITALS — BP 108/70 | HR 81 | Temp 97.7°F | Ht 62.0 in | Wt 165.0 lb

## 2024-11-26 DIAGNOSIS — R300 Dysuria: Secondary | ICD-10-CM | POA: Diagnosis not present

## 2024-11-26 DIAGNOSIS — R1024 Suprapubic pain: Secondary | ICD-10-CM

## 2024-11-26 LAB — POCT URINALYSIS DIP (CLINITEK)
Bilirubin, UA: NEGATIVE
Blood, UA: NEGATIVE
Glucose, UA: NEGATIVE mg/dL
Ketones, POC UA: NEGATIVE mg/dL
Leukocytes, UA: NEGATIVE
Nitrite, UA: NEGATIVE
POC PROTEIN,UA: NEGATIVE
Spec Grav, UA: 1.015 (ref 1.010–1.025)
Urobilinogen, UA: 0.2 U/dL
pH, UA: 6 (ref 5.0–8.0)

## 2024-11-26 MED ORDER — CEPHALEXIN 500 MG PO CAPS: 500.0000 mg | ORAL_CAPSULE | Freq: Two times a day (BID) | ORAL | 0 refills | Status: AC

## 2024-11-27 NOTE — Progress Notes (Addendum)
 Acute Office Visit  Subjective:     Patient ID: Nichole Brown, female    DOB: 10/24/1966, 58 y.o.   MRN: [id number]  Chief Complaint  Patient presents with   Lower abdominal pain    Patient is here for lower abdominal pain. Having left side abdominal pain for the past weekend.  Feels like she is forcing to pee everything out     HPI Patient is in today with complaints of left lower abdominal pain since this past weekend. Is also having increased urgency, burning with urination, and odorous urine. Reports low grade fever, chills, and malaise. Has not tried anything over the counter for symptom management. Denies any vaginal symptoms or flank pain.   Review of Systems  Constitutional:  Positive for chills and fever. Negative for fatigue.  Respiratory:  Negative for cough, chest tightness, shortness of breath and wheezing.   Cardiovascular:  Negative for chest pain and palpitations.  Genitourinary:  Positive for urgency. Negative for difficulty urinating, dysuria, flank pain, menstrual problem, vaginal discharge and vaginal pain.       Objective:    Today's Vitals   11/26/24 1617  BP: 108/70  Pulse: 81  Temp: 97.7 F (36.5 C)  SpO2: 97%  Weight: 165 lb (74.8 kg)  Height: 5' 2 (1.575 m)   Body mass index is 30.18 kg/m.   Physical Exam Vitals and nursing note reviewed.  Constitutional:      General: She is not in acute distress.    Appearance: Normal appearance. She is not ill-appearing.  Cardiovascular:     Rate and Rhythm: Normal rate and regular rhythm.     Heart sounds: Normal heart sounds, S1 normal and S2 normal. No murmur heard. Pulmonary:     Effort: Pulmonary effort is normal. No respiratory distress.     Breath sounds: Normal breath sounds. No wheezing.  Abdominal:     Palpations: Abdomen is soft.     Tenderness: There is abdominal tenderness in the suprapubic area and left lower quadrant. There is no right CVA tenderness, left CVA tenderness or  guarding.     Comments: No obvious masses or abnormalities noted on exam.   Neurological:     Mental Status: She is alert.  Psychiatric:        Mood and Affect: Mood normal.        Behavior: Behavior normal.        Thought Content: Thought content normal.        Judgment: Judgment normal.    Results for orders placed or performed in visit on 11/26/24  POCT URINALYSIS DIP (CLINITEK)  Result Value Ref Range   Color, UA yellow yellow   Clarity, UA clear clear   Glucose, UA negative negative mg/dL   Bilirubin, UA negative negative   Ketones, POC UA negative negative mg/dL   Spec Grav, UA 8.984 8.989 - 1.025   Blood, UA negative negative   pH, UA 6.0 5.0 - 8.0   POC PROTEIN,UA negative negative, trace   Urobilinogen, UA 0.2 0.2 or 1.0 E.U./dL   Nitrite, UA Negative Negative   Leukocytes, UA Negative Negative      Assessment & Plan:  1. Dysuria (Primary) -Patient's UA was negative. Ordered UC. Due to patient having systemic symptoms, and history of UTI's will treat with antibiotics at this time. If UC is negative, will instruct patient to stop antibiotics.  -Advised patient to take pyridium OTC for pain, but do not take for over 48 hours  due to masking of symptoms and potential spread of infection.  -Encouraged patient to increase fluid intake. Educated patient on hygiene and ways to avoid UTI's in the future. Patient agrees with plan.  - cephALEXin  (KEFLEX ) 500 MG capsule; Take 1 capsule (500 mg total) by mouth 2 (two) times daily for 5 days.  Dispense: 10 capsule; Refill: 0 - POCT URINALYSIS DIP (CLINITEK) - Urine Culture   Return if symptoms worsen or fail to improve.  Damien KATHEE Pringle, FNP

## 2024-11-29 ENCOUNTER — Ambulatory Visit: Payer: Self-pay

## 2024-11-29 LAB — URINE CULTURE

## 2024-12-01 ENCOUNTER — Other Ambulatory Visit: Payer: Self-pay | Admitting: Nurse Practitioner

## 2024-12-02 ENCOUNTER — Ambulatory Visit: Admitting: Nurse Practitioner

## 2024-12-02 MED ORDER — ALPRAZOLAM 0.5 MG PO TABS
0.5000 mg | ORAL_TABLET | Freq: Every evening | ORAL | 5 refills | Status: AC | PRN
  Filled 2024-12-02: qty 30, 30d supply, fill #0

## 2024-12-03 ENCOUNTER — Encounter: Payer: Self-pay | Admitting: *Deleted

## 2024-12-03 ENCOUNTER — Other Ambulatory Visit: Payer: Self-pay

## 2024-12-03 ENCOUNTER — Other Ambulatory Visit: Payer: Self-pay | Admitting: Oncology

## 2024-12-03 MED FILL — Exemestane Tab 25 MG: ORAL | 30 days supply | Qty: 30 | Fill #0 | Status: AC

## 2024-12-03 NOTE — Telephone Encounter (Signed)
 Received incoming msg from patient. She request RF for Aromasin .

## 2024-12-09 ENCOUNTER — Encounter: Payer: Self-pay | Admitting: Nurse Practitioner

## 2024-12-09 ENCOUNTER — Inpatient Hospital Stay: Attending: Oncology | Admitting: Nurse Practitioner

## 2024-12-09 ENCOUNTER — Telehealth: Payer: Self-pay | Admitting: *Deleted

## 2024-12-09 VITALS — BP 104/74 | HR 80 | Temp 98.9°F | Resp 16 | Wt 164.0 lb

## 2024-12-09 DIAGNOSIS — Z9221 Personal history of antineoplastic chemotherapy: Secondary | ICD-10-CM | POA: Insufficient documentation

## 2024-12-09 DIAGNOSIS — Z170 Estrogen receptor positive status [ER+]: Secondary | ICD-10-CM | POA: Diagnosis not present

## 2024-12-09 DIAGNOSIS — Z808 Family history of malignant neoplasm of other organs or systems: Secondary | ICD-10-CM | POA: Diagnosis not present

## 2024-12-09 DIAGNOSIS — Z1721 Progesterone receptor positive status: Secondary | ICD-10-CM | POA: Insufficient documentation

## 2024-12-09 DIAGNOSIS — Z79811 Long term (current) use of aromatase inhibitors: Secondary | ICD-10-CM | POA: Insufficient documentation

## 2024-12-09 DIAGNOSIS — Z803 Family history of malignant neoplasm of breast: Secondary | ICD-10-CM | POA: Diagnosis not present

## 2024-12-09 DIAGNOSIS — Z800 Family history of malignant neoplasm of digestive organs: Secondary | ICD-10-CM | POA: Insufficient documentation

## 2024-12-09 DIAGNOSIS — C50412 Malignant neoplasm of upper-outer quadrant of left female breast: Secondary | ICD-10-CM | POA: Diagnosis not present

## 2024-12-09 DIAGNOSIS — Z923 Personal history of irradiation: Secondary | ICD-10-CM | POA: Insufficient documentation

## 2024-12-09 DIAGNOSIS — N644 Mastodynia: Secondary | ICD-10-CM | POA: Diagnosis not present

## 2024-12-09 DIAGNOSIS — Z1732 Human epidermal growth factor receptor 2 negative status: Secondary | ICD-10-CM | POA: Insufficient documentation

## 2024-12-09 NOTE — Telephone Encounter (Signed)
 Patient scheduled for smc today at 2 pm

## 2024-12-09 NOTE — Telephone Encounter (Signed)
 Patient reports left breast tenderness with a palpable lump. States the breast feels swollen and warm to touch. Lump/area of concern located around the 12-2 o'clock position of left breast. She noted the area last evening when she laid on her left side. She denies any redness to the site. She stated that she has a burning sensation in her left nipple. No skin color changes to breast or surrounding areas. Denies any fever, or trauma or shaving under her arms. Clinical history:  pt stated that she has a h/o of 1 lymph node removed on this side.  Patient would like recommendations- could you be evaluated in [facility]. 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

## 2024-12-09 NOTE — Progress Notes (Signed)
 "  Symptom Management Clinic  [facility] A Department of the [place]. [facility] [address] [place], [place] [address] [phone] (phone) [phone] (fax)  Patient Care Team: Bluford Jacqulyn MATSU, DO as PCP - General (Family Medicine) Harvey Margo CROME, MD (Inactive) as Consulting Physician (Gastroenterology) Georgina Shasta POUR, RN as Oncology Nurse Navigator Lenn Aran, MD as Consulting Physician (Radiation Oncology) Melanee Annah BROCKS, MD as Consulting Physician (Oncology) Ebbie Cough, MD as Consulting Physician (General Surgery)   Name of the patient: Nichole Brown  [id number]  1966-03-19   Date of visit: 12/09/2024  Diagnosis-history of breast cancer  Chief complaint/ Reason for visit-left breast pain  Heme/Onc history:  Oncology History  Malignant neoplasm of upper-outer quadrant of left breast in female, estrogen receptor positive (HCC)  08/29/2022 Initial Diagnosis   Malignant neoplasm of upper-outer quadrant of left breast in female, estrogen receptor positive (HCC)   08/29/2022 Cancer Staging   Staging form: Breast, AJCC 8th Edition - Clinical stage from 08/29/2022: Stage IA (cT1b, cN0, cM0, G1, ER+, PR+, HER2-) - Signed by Melanee Annah BROCKS, MD on 08/29/2022 Stage prefix: Initial diagnosis Histologic grading system: 3 grade system    Genetic Testing   Negative genetic testing. No pathogenic variants identified on the [facility] CancerNext-Expanded+RNA panel. The report date is 09/16/2022.  The CancerNext-Expanded + RNAinsight gene panel offered by [employer] and includes sequencing and rearrangement analysis for the following 77 genes: IP, ALK, APC*, ATM*, AXIN2, BAP1, BARD1, BLM, BMPR1A, BRCA1*, BRCA2*, BRIP1*, CDC73, CDH1*,CDK4, CDKN1B, CDKN2A, CHEK2*, CTNNA1, DICER1, FANCC, FH, FLCN, GALNT12, KIF1B, LZTR1, MAX, MEN1, MET, MLH1*, MSH2*, MSH3, MSH6*, MUTYH*, NBN, NF1*, NF2, NTHL1, PALB2*, PHOX2B, PMS2*, POT1, PRKAR1A,  PTCH1, PTEN*, RAD51C*, RAD51D*,RB1, RECQL, RET, SDHA, SDHAF2, SDHB, SDHC, SDHD, SMAD4, SMARCA4, SMARCB1, SMARCE1, STK11, SUFU, TMEM127, TP53*,TSC1, TSC2, VHL and XRCC2 (sequencing and deletion/duplication); EGFR, EGLN1, HOXB13, KIT, MITF, PDGFRA, POLD1 and POLE (sequencing only); EPCAM and GREM1 (deletion/duplication only).   10/07/2022 Cancer Staging   Staging form: Breast, AJCC 8th Edition - Pathologic stage from 10/07/2022: Stage IA (pT1b, pN0, cM0, G2, ER+, PR+, HER2-) - Signed by Melanee Annah BROCKS, MD on 10/07/2022 Multigene prognostic tests performed: Oncotype DX Histologic grading system: 3 grade system   11/11/2022 - 01/16/2023 Chemotherapy   Patient is on Treatment Plan : BREAST TC q21d       Interval history-patient is a 58 year old female with above history of breast cancer status post lobectomy and radiation followed by chemotherapy, now on endocrine therapy who presents to symptom management clinic for complaints of left breast pain.  She was watching television last night when she felt a sudden pulling pain across her left breast.  She says that it felt tight.  Symptoms have now improved but not resolved.  She did a self breast exam and fullness apparent mass.  Denies trauma.  Denies injury  Review of systems- Review of Systems  Constitutional:  Negative for chills, malaise/fatigue and weight loss.  Respiratory:  Negative for cough, hemoptysis, sputum production, shortness of breath and wheezing.   Cardiovascular:  Positive for chest pain. Negative for palpitations, orthopnea and claudication.  Gastrointestinal:  Negative for abdominal pain, heartburn, nausea and vomiting.  Neurological:  Negative for focal weakness and weakness.  Endo/Heme/Allergies:  Does not bruise/bleed easily.  Psychiatric/Behavioral:  Negative for depression. The patient is not nervous/anxious.      Allergies[1]  Past Medical History:  Diagnosis Date   Anxiety    Bronchitis  Cancer (HCC) 09/2022   left  breast IMC   Complication of anesthesia    PONV   Contusion of leg 10/06/2015   Depression    GERD (gastroesophageal reflux disease)    Insomnia    Migraine headache    Pneumonia    2000   PONV (postoperative nausea and vomiting)     Past Surgical History:  Procedure Laterality Date   ABDOMINAL HYSTERECTOMY     age 60/37   BALLOON DILATION N/A 12/14/2020   Procedure: BALLOON DILATION;  Surgeon: Cindie Carlin POUR, DO;  [facility];  Service: Endoscopy;  Laterality: N/A;   BIOPSY  12/14/2020   Procedure: BIOPSY;  Surgeon: Cindie Carlin POUR, DO;  [facility];  Service: Endoscopy;;   BREAST BIOPSY Left 08/24/2022   us  bx, coil marker +   BREAST LUMPECTOMY WITH RADIOACTIVE SEED AND SENTINEL LYMPH NODE BIOPSY Left 09/29/2022   Invasive ductal carcinoma, grade 2, pT1b, pN0  - Margins are negative for carcinoma in situ or invasive carcinoma   CESAREAN SECTION     CHOLECYSTECTOMY  1988   COLONOSCOPY N/A 12/10/2018    six 4-8 mm polyps in proximal descending colon, mid transverse and hepatic flexure. Diverticulosis. External and internal hemorrhoids. Torturous colon. Two simple adenomas, two serrated polyps, 2 hyperplastic polyps. Surveillance due Dec 2022.    COLONOSCOPY N/A 05/03/2024   Procedure: COLONOSCOPY;  Surgeon: Cindie Carlin POUR, DO;  [facility];  Service: Endoscopy;  Laterality: N/A;  1:00PM, ASA 2   ESOPHAGOGASTRODUODENOSCOPY (EGD) WITH PROPOFOL  N/A 12/14/2020   Benign-appearing esophageal stenosis s/p dilation, gastritis, normal duodenum. Mild chronic gastritis.    open heart surgery  1967   PATENT DUCTUS ARTERIOUS REPAIR  1967   POLYPECTOMY  12/10/2018   Procedure: POLYPECTOMY;  Surgeon: Harvey Margo CROME, MD;  [facility];  Service: Endoscopy;;  hepatic flexure, descending, transverse   PORT-A-CATH REMOVAL Right 03/14/2023   Procedure: REMOVAL PORT-A-CATH;  Surgeon: Ebbie Cough, MD;  Location: [facility];   Service: General;  Laterality: Right;   PORTACATH PLACEMENT N/A 11/14/2022   Procedure: PORT PLACEMENT WITH ULTRASOUND GUIDANCE;  Surgeon: Ebbie Cough, MD;  Location:  SURGERY CENTER;  Service: General;  Laterality: N/A;   SHOULDER ARTHROSCOPY Left 06/16/2017   Procedure: LEFT SHOULDER ARTHROSCOPY, DEBRIDEMENT, AND DECOMPRESSION;  Surgeon: Harden Jerona GAILS, MD;  Location: [facility];  Service: Orthopedics;  Laterality: Left;   SHOULDER ARTHROSCOPY WITH SUBACROMIAL DECOMPRESSION Left 10/03/2019   Procedure: SHOULDER ARTHROSCOPY WITH DEBRIDEMENT, DECOMPRESSION;  Surgeon: Edie Norleen PARAS, MD;  Location: [facility];  Service: Orthopedics;  Laterality: Left;   SHOULDER SURGERY Left 2008   [facility]    Social History   Socioeconomic History   Marital status: Married    Spouse name: daniel   Number of children: 4   Years of education: Not on file   Highest education level: Associate degree: occupational, scientist, product/process development, or vocational program  Occupational History   Not on file  Tobacco Use   Smoking status: Never   Smokeless tobacco: Never  Vaping Use   Vaping status: Never Used  Substance and Sexual Activity   Alcohol use: Yes    Comment: occassionally-not since chemo started   Drug use: No   Sexual activity: Not Currently    Birth control/protection: Surgical    Comment: hyst  Other Topics Concern   Not on file  Social History Narrative   Not on file   Social Drivers of Health  Tobacco Use: Low Risk (12/09/2024)   Patient History    Smoking Tobacco Use: Never    Smokeless Tobacco Use: Never    Passive Exposure: Not on file  Financial Resource Strain: Low Risk  (11/04/2024)   Received from [facility]   Overall Financial Resource Strain (CARDIA)    Difficulty of Paying Living Expenses: Not hard at all  Food Insecurity: No Food Insecurity (11/04/2024)   Received from [facility]   Epic    Within the past 12 months, you worried that  your food would run out before you got the money to buy more.: Never true    Within the past 12 months, the food you bought just didn't last and you didn't have money to get more.: Never true  Transportation Needs: No Transportation Needs (11/04/2024)   Received from [facility] - Transportation    In the past 12 months, has lack of transportation kept you from medical appointments or from getting medications?: No    Lack of Transportation (Non-Medical): No  Physical Activity: Insufficiently Active (05/13/2024)   Exercise Vital Sign    Days of Exercise per Week: 3 days    Minutes of Exercise per Session: 30 min  Stress: No Stress Concern Present (05/13/2024)   [employer] of Occupational Health - Occupational Stress Questionnaire    Feeling of Stress : Only a little  Recent Concern: Stress - Stress Concern Present (04/30/2024)   [employer] of Occupational Health - Occupational Stress Questionnaire    Feeling of Stress : Very much  Social Connections: Socially Integrated (05/13/2024)   Social Connection and Isolation Panel    Frequency of Communication with Friends and Family: Twice a week    Frequency of Social Gatherings with Friends and Family: More than three times a week    Attends Religious Services: More than 4 times per year    Active Member of [employer] or Organizations: Yes    Attends Engineer, Structural: More than 4 times per year    Marital Status: Married  Catering Manager Violence: Not At Risk (05/13/2024)   Humiliation, Afraid, Rape, and Kick questionnaire    Fear of Current or Ex-Partner: No    Emotionally Abused: No    Physically Abused: No    Sexually Abused: No  Depression (PHQ2-9): Low Risk (12/09/2024)   Depression (PHQ2-9)    PHQ-2 Score: 0  Alcohol Screen: Low Risk (05/13/2024)   Alcohol Screen    Last Alcohol Screening Score (AUDIT): 1  Housing: Low Risk  (11/04/2024)   Received from [facility]   Epic     In the last 12 months, was there a time when you were not able to pay the mortgage or rent on time?: No    In the past 12 months, how many times have you moved where you were living?: 0    At any time in the past 12 months, were you homeless or living in a shelter (including now)?: No  Utilities: Not At Risk (11/04/2024)   Received from [facility]   Epic    In the past 12 months has the electric, gas, oil, or water  company threatened to shut off services in your home?: No  Health Literacy: Not on file    Family History  Problem Relation Age of Onset   Hypertension Maternal Grandmother    Diabetes Maternal Grandmother    Heart disease Maternal Grandmother 71  MI   Hypertension Maternal Grandfather    Throat cancer Maternal Grandfather 66   Hypertension Father    Breast cancer Mother 5   Colon cancer Paternal Uncle        dx 31s   Mental illness Son    Migraines Daughter    Current Medications[2]  Physical exam:  Vitals:   12/09/24 1344  BP: 104/74  Pulse: 80  Resp: 16  Temp: 98.9 F (37.2 C)  TempSrc: Tympanic  SpO2: 97%  Weight: 164 lb (74.4 kg)   Physical Exam Vitals reviewed.  Constitutional:      Appearance: She is not ill-appearing.  Chest:     Comments: Left breast appears smaller in size compared to right with increased fullness of upper half compared to right. Well healed surgical scar. No masses palpated. Tenderness at 3:00. No apparent skin lesions. No nipple discharge.  Skin:    Coloration: Skin is not pale.     Findings: No rash.  Neurological:     Mental Status: She is alert and oriented to person, place, and time.  Psychiatric:        Mood and Affect: Mood normal.        Behavior: Behavior normal.    No results found.  Assessment and plan- Patient is a 58 y.o. female with history of breast cancer, now on surveillance, who presents to symptom management clinic for complaints of:   Left breast pain- acute x 1 day.  Localizes to upper left breast and left outer quadrant. Tender to touch. No masses appreciated. Negative mammogram in August 2025. Suspect MSK etiology, possible spasm. Breast pain is a common condition, which will often resolve on its own without intervention. It can be affected by hormonal changes, medication side effect, weight changes and fit of the bra. Pain may also be referred from other adjacent areas of the body. She is prescribed tizanidine  and I recommended trial. If symptoms do not improve or worsen, recommend reevaluation, possible imaging. She will follow up with med-onc for surveillance as scheduled.    Visit Diagnosis 1. Breast pain, left    Patient expressed understanding and was in agreement with this plan. She also understands that She can call clinic at any time with any questions, concerns, or complaints.   Thank you for allowing me to participate in the care of this very pleasant patient.   Tinnie Dawn, DNP, AGNP-C, AOCNP [facility] [phone]  CC: Dr Melanee    [1]  Allergies Allergen Reactions   Cymbalta  [Duloxetine  Hcl] Other (See Comments)    Hallucinations; sleep walking   Nortriptyline  Nausea And Vomiting   Phenergan  [Promethazine  Hcl]     Restless legs and cramping  [2]  Current Outpatient Medications:    acetaminophen  (TYLENOL ) 500 MG tablet, Take 500 mg by mouth every 6 (six) hours as needed for moderate pain., Disp: , Rfl:    ALPRAZolam  (XANAX ) 0.5 MG tablet, Take 1 tablet (0.5 mg total) by mouth at bedtime as needed., Disp: 30 tablet, Rfl: 5   Calcium -Magnesium-Vitamin D  (CALCIUM  1200+D3 PO), Take 1 Capful by mouth daily., Disp: , Rfl:    cetirizine -pseudoephedrine  (ALLERGY RELIEF/NASAL DECONGEST) 5-120 MG tablet, Take 1 tablet by mouth 2 (two) times daily., Disp: 60 tablet, Rfl: 3   exemestane  (AROMASIN ) 25 MG tablet, Take 1 tablet (25 mg total) by mouth daily after breakfast., Disp: 30 tablet, Rfl: 3   fluticasone  (FLONASE ) 50  MCG/ACT nasal spray, Place 2 sprays into both nostrils daily.,  Disp: 16 g, Rfl: 5   folic acid  (FOLVITE ) 1 MG tablet, Take 1 tablet (1 mg total) by mouth daily., Disp: 90 tablet, Rfl: 3   Lifitegrast  (XIIDRA ) 5 % SOLN, Instill 1 drop into both eyes twice a day, Disp: 180 each, Rfl: 4   Lifitegrast  (XIIDRA ) 5 % SOLN, Place 1 drop into both eyes 2 (two) times daily., Disp: 60 each, Rfl: 11   methotrexate  (RHEUMATREX) 2.5 MG tablet, Take 4 tablets (10 mg total) by mouth every 7 (seven) days., Disp: 16 tablet, Rfl: 2   ondansetron  (ZOFRAN -ODT) 4 MG disintegrating tablet, Take 1 tablet (4 mg total) by mouth every 8 (eight) hours as needed for Nausea migraine, Disp: 20 tablet, Rfl: 11   predniSONE  (DELTASONE ) 5 MG tablet, Take 4 tablets (20 mg total) by mouth once daily for 3 days, THEN 3 tablets (15 mg total) once daily for 3 days, THEN 2 tablets (10 mg total) once daily for 3 days, THEN 1 tablet (5 mg total) once daily for 3 days. Take in the mornings., Disp: 30 tablet, Rfl: 0   pregabalin  (LYRICA ) 75 MG capsule, Take 1 capsule (75 mg total) by mouth 2 (two) times daily., Disp: 180 capsule, Rfl: 1   Probiotic Product (PROBIOTIC PO), Take 1 capsule by mouth daily., Disp: , Rfl:    sulfaSALAzine  (AZULFIDINE ) 500 MG EC tablet, Take 2 tablets (1,000 mg total) by mouth 2 (two) times daily., Disp: 120 tablet, Rfl: 2   SUMAtriptan  (IMITREX ) 100 MG tablet, Take 1 tablet (100 mg total) by mouth as directed for Migraine May take a second dose after 2 hours if needed., Disp: 9 tablet, Rfl: 11   tiZANidine  (ZANAFLEX ) 2 MG tablet, Take 0.5-2 tablets (1-4 mg total) by mouth 3 (three) times daily as needed for muscoskeletal pain or muscle spasms.  Start with 2mg  at bedtime for the first few days, or 1mg  if taking with valacyclovir ., Disp: 180 tablet, Rfl: 0   topiramate  (TOPAMAX ) 25 MG tablet, Take 1 tablet (25 mg total) by mouth 2 (two) times daily., Disp: 60 tablet, Rfl: 11   topiramate  (TOPAMAX ) 25 MG tablet, Take 3  tablets (75 mg total) by mouth 2 (two) times daily., Disp: 180 tablet, Rfl: 11   Vaginal Lubricant (HYALO GYN) GEL, Place vaginally every other day., Disp: , Rfl:   "

## 2024-12-09 NOTE — Telephone Encounter (Signed)
 Smc today

## 2024-12-11 ENCOUNTER — Other Ambulatory Visit: Payer: Self-pay | Admitting: Surgery

## 2024-12-16 ENCOUNTER — Other Ambulatory Visit: Payer: Self-pay

## 2024-12-18 ENCOUNTER — Other Ambulatory Visit: Payer: Self-pay

## 2024-12-18 ENCOUNTER — Other Ambulatory Visit (HOSPITAL_COMMUNITY): Payer: Self-pay

## 2024-12-19 ENCOUNTER — Other Ambulatory Visit: Payer: Self-pay

## 2024-12-25 ENCOUNTER — Inpatient Hospital Stay: Admission: RE | Admit: 2024-12-25 | Source: Ambulatory Visit

## 2024-12-30 ENCOUNTER — Ambulatory Visit: Admitting: Family Medicine

## 2024-12-30 VITALS — BP 124/78 | HR 84 | Temp 98.8°F | Ht 62.0 in | Wt 169.2 lb

## 2024-12-30 DIAGNOSIS — R7989 Other specified abnormal findings of blood chemistry: Secondary | ICD-10-CM | POA: Diagnosis not present

## 2024-12-30 DIAGNOSIS — L4050 Arthropathic psoriasis, unspecified: Secondary | ICD-10-CM | POA: Insufficient documentation

## 2024-12-30 NOTE — Assessment & Plan Note (Signed)
 Patient requested to see a different rheumatologist.  Referral placed per patient request.

## 2024-12-30 NOTE — Progress Notes (Signed)
 "  Subjective:  Patient ID: Nichole Brown, female    DOB: [birth year]  Age: 59 y.o. MRN: [id number]  CC:   Chief Complaint  Patient presents with   Referral    Requesting referral to Rheumatologist Dr. Leeroy Caldron [facility] - [address] [place] for a second opinion.    HPI:  59 year old female presents for evaluation of the above.  Patient recently seen by her rheumatologist.  She sees rheumatology for psoriatic arthritis.  She states that her creatinine was elevated at 1.1 and GFR was 56.  She is concerned that this is related to her medications.  She states that she drinks water  and avoids NSAIDs.  She is requesting to see rheumatologist for second opinion.  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   Arthralgia of multiple sites 08/04/2023   Malignant neoplasm of upper-outer quadrant of left breast in female, estrogen receptor positive (HCC) 08/29/2022   Depression, major, single episode, moderate (HCC) 01/01/2019   Generalized anxiety disorder 01/01/2019   Gastroesophageal reflux disease 12/26/2016   Migraine headache 07/24/2013   Insomnia 05/30/2013   Fibromyalgia 05/30/2013    Social Hx   Social History   Socioeconomic History   Marital status: Married    Spouse name: daniel   Number of children: 4   Years of education: Not on file   Highest education level: Some college, no degree  Occupational History   Not on file  Tobacco Use   Smoking status: Never   Smokeless tobacco: Never  Vaping Use   Vaping status: Never Used  Substance and Sexual Activity   Alcohol use: Yes     Comment: occassionally-not since chemo started   Drug use: No   Sexual activity: Not Currently    Birth control/protection: Surgical    Comment: hyst  Other Topics Concern   Not on file  Social History Narrative   Not on file   Social Drivers of Health   Tobacco Use: Low Risk (12/30/2024)   Patient History    Smoking Tobacco Use: Never    Smokeless Tobacco Use: Never    Passive Exposure: Not on file  Financial Resource Strain: Medium Risk (12/30/2024)   Overall Financial Resource Strain (CARDIA)    Difficulty of Paying Living Expenses: Somewhat hard  Food Insecurity: Unknown (12/30/2024)   Epic    Worried About Programme Researcher, Broadcasting/film/video in the Last Year: Not on file    [employer] of Food in the Last Year: Never true  Transportation Needs: No Transportation Needs (12/30/2024)   Epic    Lack of Transportation (Medical): No    Lack of Transportation (Non-Medical): No  Physical Activity: Insufficiently Active (12/30/2024)   Exercise Vital Sign    Days of Exercise per Week: 3 days    Minutes of Exercise per Session: 30 min  Stress: No Stress Concern Present (12/30/2024)   [employer] of Occupational Health - Occupational Stress Questionnaire    Feeling of Stress: Only a little  Social Connections: Socially Integrated (12/30/2024)   Social Connection and Isolation Panel    Frequency of Communication with Friends and Family: More than  three times a week    Frequency of Social Gatherings with Friends and Family: Three times a week    Attends Religious Services: More than 4 times per year    Active Member of Clubs or Organizations: Yes    Attends Banker Meetings: More than 4 times per year    Marital Status: Married  Depression (PHQ2-9): Low Risk (12/30/2024)   Depression (PHQ2-9)    PHQ-2 Score: 3  Alcohol Screen: Low Risk (05/13/2024)   Alcohol Screen    Last Alcohol Screening Score (AUDIT): 1  Housing: Unknown (12/30/2024)   Epic    Unable to Pay for Housing in the Last  Year: No    Number of Times Moved in the Last Year: Not on file    Homeless in the Last Year: No  Utilities: Not At Risk (11/04/2024)   Received from [facility]   Epic    In the past 12 months has the electric, gas, oil, or water  company threatened to shut off services in your home?: No  Health Literacy: Not on file    Review of Systems Per HPI  Objective:  BP 124/78   Pulse 84   Temp 98.8 F (37.1 C)   Ht 5' 2 (1.575 m)   Wt 169 lb 3.2 oz (76.7 kg)   SpO2 99%   BMI 30.95 kg/m      12/30/2024   10:06 AM 12/09/2024    1:44 PM 11/26/2024    4:17 PM  BP/Weight  Systolic BP 124 104 108  Diastolic BP 78 74 70  Wt. (Lbs) 169.2 164 165  BMI 30.95 kg/m2 30 kg/m2 30.18 kg/m2    Physical Exam Vitals and nursing note reviewed.  Constitutional:      General: She is not in acute distress.    Appearance: Normal appearance.  HENT:     Head: Normocephalic and atraumatic.  Eyes:     General:        Right eye: No discharge.        Left eye: No discharge.     Conjunctiva/sclera: Conjunctivae normal.  Pulmonary:     Effort: Pulmonary effort is normal. No respiratory distress.  Neurological:     Mental Status: She is alert.     Comments: Anxious.     Lab Results  Component Value Date   WBC 7.3 07/29/2024   HGB 13.1 07/29/2024   HCT 40.2 07/29/2024   PLT 216 07/29/2024   GLUCOSE 105 (H) 07/29/2024   CHOL 179 06/03/2024   TRIG 125 06/03/2024   HDL 40 06/03/2024   LDLCALC 116 (H) 06/03/2024   ALT 19 07/29/2024   AST 21 07/29/2024   NA 135 07/29/2024   K 3.5 07/29/2024   CL 104 07/29/2024   CREATININE 0.76 07/29/2024   BUN 15 07/29/2024   CO2 23 07/29/2024   TSH 3.020 06/03/2024   HGBA1C 6.0 (H) 06/03/2024     Assessment & Plan:  Psoriatic arthritis (HCC) Assessment & Plan: Patient requested to see a different rheumatologist.  Referral placed per patient request.  Orders: -     Ambulatory referral to Rheumatology  Elevated serum  creatinine Assessment & Plan: Recent elevated creatinine of 1.1.  GFR 56. Very concerned.  Advised recheck.  This is likely transient.  Do not think this is related to her medications.  Will continue to monitor.  Orders: -     Basic metabolic panel with GFR -     Microalbumin /  creatinine urine ratio   Cylinda Santoli DO [facility] "

## 2024-12-30 NOTE — Patient Instructions (Addendum)
 Labs ordered (wait about 2 weeks to 1 month).  Referral placed.

## 2024-12-30 NOTE — Assessment & Plan Note (Signed)
 Recent elevated creatinine of 1.1.  GFR 56. Very concerned.  Advised recheck.  This is likely transient.  Do not think this is related to her medications.  Will continue to monitor.

## 2025-01-02 ENCOUNTER — Other Ambulatory Visit (HOSPITAL_COMMUNITY): Payer: Self-pay

## 2025-01-02 MED FILL — Exemestane Tab 25 MG: ORAL | 30 days supply | Qty: 30 | Fill #1 | Status: CN

## 2025-01-02 MED FILL — Exemestane Tab 25 MG: ORAL | 90 days supply | Qty: 90 | Fill #1 | Status: AC

## 2025-01-08 ENCOUNTER — Telehealth: Payer: Self-pay | Admitting: *Deleted

## 2025-01-08 NOTE — Telephone Encounter (Signed)
 Cindy from rheumatology office called to request a creat level for patients upcoming lab apt with Dr. Melanee. I returned call to [facility] dept and left msg with front receptionist. I explained that Dr. Melanee does not have any lab apt needed for next apt on 2/16. (No labs needed). Also, we our office policy does not allow our clinic to draw labs from another provider's office.

## 2025-01-27 ENCOUNTER — Ambulatory Visit: Admitting: Oncology

## 2025-01-29 ENCOUNTER — Ambulatory Visit: Admitting: Oncology

## 2025-02-26 ENCOUNTER — Encounter: Admission: RE | Payer: Self-pay | Source: Home / Self Care

## 2025-02-26 ENCOUNTER — Ambulatory Visit: Admission: RE | Admit: 2025-02-26 | Source: Home / Self Care | Admitting: Surgery

## 2025-02-26 SURGERY — DECOMPRESSION, NERVE, SUPERFICIAL PERONEAL
Anesthesia: Choice | Site: Knee | Laterality: Right

## 2025-03-17 ENCOUNTER — Ambulatory Visit: Admitting: *Deleted

## 2025-04-21 ENCOUNTER — Ambulatory Visit: Admitting: Radiation Oncology
